# Patient Record
Sex: Female | Born: 1940 | Race: White | Hispanic: No | Smoking: Current every day smoker
Health system: Southern US, Community
[De-identification: ages and names within clinical notes are randomized; demographics above are authoritative.]

## PROBLEM LIST (undated history)

## (undated) DIAGNOSIS — H269 Unspecified cataract: Secondary | ICD-10-CM

## (undated) DIAGNOSIS — R0902 Hypoxemia: Secondary | ICD-10-CM

## (undated) DIAGNOSIS — I10 Essential (primary) hypertension: Secondary | ICD-10-CM

## (undated) DIAGNOSIS — R0600 Dyspnea, unspecified: Secondary | ICD-10-CM

## (undated) DIAGNOSIS — R011 Cardiac murmur, unspecified: Secondary | ICD-10-CM

## (undated) DIAGNOSIS — M419 Scoliosis, unspecified: Secondary | ICD-10-CM

## (undated) DIAGNOSIS — I729 Aneurysm of unspecified site: Secondary | ICD-10-CM

## (undated) DIAGNOSIS — J449 Chronic obstructive pulmonary disease, unspecified: Secondary | ICD-10-CM

## (undated) DIAGNOSIS — I739 Peripheral vascular disease, unspecified: Secondary | ICD-10-CM

## (undated) DIAGNOSIS — A419 Sepsis, unspecified organism: Secondary | ICD-10-CM

## (undated) DIAGNOSIS — K219 Gastro-esophageal reflux disease without esophagitis: Secondary | ICD-10-CM

## (undated) DIAGNOSIS — I38 Endocarditis, valve unspecified: Secondary | ICD-10-CM

## (undated) DIAGNOSIS — R000 Tachycardia, unspecified: Secondary | ICD-10-CM

## (undated) DIAGNOSIS — M4800 Spinal stenosis, site unspecified: Secondary | ICD-10-CM

## (undated) DIAGNOSIS — H5440 Blindness, one eye, unspecified eye: Secondary | ICD-10-CM

## (undated) DIAGNOSIS — I469 Cardiac arrest, cause unspecified: Secondary | ICD-10-CM

## (undated) DIAGNOSIS — T4145XA Adverse effect of unspecified anesthetic, initial encounter: Secondary | ICD-10-CM

## (undated) DIAGNOSIS — F419 Anxiety disorder, unspecified: Secondary | ICD-10-CM

## (undated) DIAGNOSIS — I639 Cerebral infarction, unspecified: Secondary | ICD-10-CM

## (undated) HISTORY — DX: Unspecified cataract: H26.9

## (undated) HISTORY — DX: Cardiac murmur, unspecified: R01.1

## (undated) HISTORY — PX: OTHER SURGICAL HISTORY: SHX169

## (undated) HISTORY — PX: ABDOMINAL HYSTERECTOMY: SHX81

## (undated) HISTORY — DX: Cerebral infarction, unspecified: I63.9

## (undated) HISTORY — DX: Aneurysm of unspecified site: I72.9

## (undated) HISTORY — PX: BREAST LUMPECTOMY: SHX2

## (undated) HISTORY — DX: Blindness, one eye, unspecified eye: H54.40

## (undated) HISTORY — PX: WISDOM TOOTH EXTRACTION: SHX21

## (undated) HISTORY — PX: TOTAL ABDOMINAL HYSTERECTOMY: SHX209

## (undated) HISTORY — PX: BACK SURGERY: SHX140

---
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
## 2008-09-19 ENCOUNTER — Ambulatory Visit: Payer: Self-pay | Admitting: Family Medicine

## 2010-12-06 ENCOUNTER — Ambulatory Visit: Payer: Self-pay | Admitting: Internal Medicine

## 2011-03-06 ENCOUNTER — Ambulatory Visit: Payer: Self-pay | Admitting: Internal Medicine

## 2011-06-17 ENCOUNTER — Ambulatory Visit: Payer: Self-pay

## 2011-07-25 DIAGNOSIS — Z87891 Personal history of nicotine dependence: Secondary | ICD-10-CM | POA: Insufficient documentation

## 2012-02-25 ENCOUNTER — Ambulatory Visit: Payer: Self-pay | Admitting: Physician Assistant

## 2012-02-28 DIAGNOSIS — M419 Scoliosis, unspecified: Secondary | ICD-10-CM | POA: Insufficient documentation

## 2012-02-28 DIAGNOSIS — M48062 Spinal stenosis, lumbar region with neurogenic claudication: Secondary | ICD-10-CM | POA: Insufficient documentation

## 2012-03-15 DIAGNOSIS — T8859XA Other complications of anesthesia, initial encounter: Secondary | ICD-10-CM

## 2012-03-15 HISTORY — DX: Other complications of anesthesia, initial encounter: T88.59XA

## 2012-03-18 DIAGNOSIS — R410 Disorientation, unspecified: Secondary | ICD-10-CM

## 2012-03-18 DIAGNOSIS — R140 Abdominal distension (gaseous): Secondary | ICD-10-CM

## 2012-03-18 HISTORY — DX: Disorientation, unspecified: R41.0

## 2012-03-18 HISTORY — DX: Abdominal distension (gaseous): R14.0

## 2012-03-22 DIAGNOSIS — N179 Acute kidney failure, unspecified: Secondary | ICD-10-CM

## 2012-03-22 DIAGNOSIS — J9601 Acute respiratory failure with hypoxia: Secondary | ICD-10-CM | POA: Insufficient documentation

## 2012-03-22 HISTORY — DX: Acute kidney failure, unspecified: N17.9

## 2012-03-23 DIAGNOSIS — R579 Shock, unspecified: Secondary | ICD-10-CM

## 2012-03-23 DIAGNOSIS — R6510 Systemic inflammatory response syndrome (SIRS) of non-infectious origin without acute organ dysfunction: Secondary | ICD-10-CM

## 2012-03-23 HISTORY — DX: Shock, unspecified: R57.9

## 2012-03-23 HISTORY — DX: Systemic inflammatory response syndrome (sirs) of non-infectious origin without acute organ dysfunction: R65.10

## 2012-09-10 ENCOUNTER — Ambulatory Visit: Payer: Self-pay

## 2012-09-10 ENCOUNTER — Inpatient Hospital Stay: Payer: Self-pay | Admitting: Internal Medicine

## 2012-09-10 LAB — CK TOTAL AND CKMB (NOT AT ARMC): CK-MB: <0.5 ng/mL — ABNORMAL LOW (ref 0.5–3.6)

## 2012-09-10 LAB — URINALYSIS, COMPLETE
Bilirubin,UR: NEGATIVE
Glucose,UR: NEGATIVE mg/dL (ref 0–75)
Hyaline Cast: 21
Leukocyte Esterase: NEGATIVE
Nitrite: NEGATIVE
Ph: 5 (ref 4.5–8.0)
Specific Gravity: 1.023 (ref 1.003–1.030)
WBC UR: 3 /HPF (ref 0–5)

## 2012-09-10 LAB — BASIC METABOLIC PANEL
Anion Gap: 10 (ref 7–16)
BUN: 17 mg/dL (ref 7–18)
Calcium, Total: 9 mg/dL (ref 8.5–10.1)
Chloride: 100 mmol/L (ref 98–107)
Co2: 24 mmol/L (ref 21–32)
EGFR (African American): >60
EGFR (Non-African Amer.): >60
Sodium: 134 mmol/L — ABNORMAL LOW (ref 136–145)

## 2012-09-10 LAB — CBC
HCT: 44.1 % (ref 35.0–47.0)
Platelet: 284 10*3/uL (ref 150–440)
RBC: 4.83 10*6/uL (ref 3.80–5.20)
RDW: 13.7 % (ref 11.5–14.5)

## 2012-09-11 LAB — CBC WITH DIFFERENTIAL/PLATELET
Basophil #: 0 10*3/uL (ref 0.0–0.1)
Basophil %: 0.4 %
Eosinophil %: 0 %
MCH: 29.7 pg (ref 26.0–34.0)
MCV: 91 fL (ref 80–100)
Monocyte %: 1.7 %
Neutrophil #: 9.5 10*3/uL — ABNORMAL HIGH (ref 1.4–6.5)
Neutrophil %: 89.7 %
Platelet: 261 10*3/uL (ref 150–440)
RBC: 4.34 10*6/uL (ref 3.80–5.20)
RDW: 13.5 % (ref 11.5–14.5)

## 2012-09-11 LAB — BASIC METABOLIC PANEL
BUN: 22 mg/dL — ABNORMAL HIGH (ref 7–18)
EGFR (African American): >60
EGFR (Non-African Amer.): >60
Glucose: 262 mg/dL — ABNORMAL HIGH (ref 65–99)
Osmolality: 279 (ref 275–301)
Potassium: 3.8 mmol/L (ref 3.5–5.1)
Sodium: 133 mmol/L — ABNORMAL LOW (ref 136–145)

## 2012-09-11 LAB — TROPONIN I
Troponin-I: <0.02 ng/mL
Troponin-I: <0.02 ng/mL

## 2012-09-11 LAB — LIPID PANEL
Cholesterol: 166 mg/dL (ref 0–200)
Ldl Cholesterol, Calc: 109 mg/dL — ABNORMAL HIGH (ref 0–100)
Triglycerides: 151 mg/dL (ref 0–200)

## 2012-09-12 DIAGNOSIS — I509 Heart failure, unspecified: Secondary | ICD-10-CM

## 2012-09-12 LAB — SODIUM
Sodium: 130 mmol/L — ABNORMAL LOW (ref 136–145)
Sodium: 132 mmol/L — ABNORMAL LOW (ref 136–145)

## 2012-09-12 LAB — URINE CULTURE

## 2012-09-16 LAB — CULTURE, BLOOD (SINGLE)

## 2014-12-05 NOTE — Discharge Summary (Signed)
PATIENT NAME:  Savannah Mcclure, Savannah Mcclure MR#:  [id number] DATE OF BIRTH:  [birth year]  DATE OF ADMISSION:  09/10/2012 DATE OF DISCHARGE:  09/12/2012  ADMITTING DIAGNOSIS:  Chronic obstructive pulmonary disease exacerbation.   DISCHARGE DIAGNOSES:   1.  Severe exacerbation of _____.  2.  Hyponatremia.  3.  Mild dehydration likely. Seemed to be improving on intravenous fluids.  4.  Suspected influenza; however, flu testing is negative.  5.  Tobacco abuse.  6.  History of hypertension.  7.  Anxiety and depression.  8.  Sciatica.  9.  Back pains.  10.  Peripheral vascular disease.  11.  Coronary artery disease status post cardiac arrest in the remote past.   DISCHARGE CONDITION:  Stable.   DISCHARGE MEDICATIONS:  The patient is to resume her outpatient medications which are:  1.  Vitamin D3 at 1000 International Units once daily.  2.  Cilostazol 100 mg p.o. twice daily.  3.  Coenzyme Q10 mg 100 mg p.o. 2 capsules at bedtime.  4.  Vitamin B12 at 1000 mcg p.o. daily.  5.  Diazepam 5 mg 1/2 tablet 2 times daily.  6.  Multivitamin once daily.  7.  Gabapentin 100 mg p.o. twice daily.  8.  Albuterol ipratropium aerosol 1 puff twice daily as needed.  9.  Artificial tear ophthalmic solution 1 drop to each affected eye twice daily as needed.  10.  Asmanex Twisthaler 1 puff twice daily.  11.  Nitrostat 0.4 mg sublingually every 5 minutes as needed.  12.  Oxycodone 5 mg every 6 hours as needed.  13.  Sertraline 50 mg p.o. once daily.  14.  Symbicort 160/4.5 mcg 1 puff twice daily.  15.  Amlodipine/benazepril 5/10 mg 2 capsules once Mcclure day (this is Mcclure new medication dose. In the past, she was only on 1 capsule once daily).  16.  Combivent Respimat CFC-3 100/20 mcg 1 puff 4 times daily.  17.  Oseltamivir which is Tamiflu 75 mg p.o. 1 capsule twice daily for Mcclure total of 5 days.  18.  Guaifenesin  600 mg p.o. twice daily for 14 days.  19.  Azithromycin 250 mg p.o. once daily for 4 days.  20.  _____  inhaler 1 every 1 hour as needed.  21.  Tussionex 5 mL twice daily for 14 days as needed.  22.  Prednisone taper 50 mg p.o. once on the September 13, 2012  then taper by 10 mg daily then stop.   HOME OXYGEN:  None.  DIET:  2 grams salt, low fat, low cholesterol, diet consistency regular.   ACTIVITY LIMITATIONS:  As tolerated.   FOLLOWUP APPOINTMENT:  With Dr. Shea Evans in [place] in 2 days after discharge.   CONSULTANTS:  Care management.   RADIOLOGIC STUDIES:  Chest x-ray PA and lateral on September 10, 2012, revealed findings concerning for superimposed upper lobe pneumonia and reticular nodular densities in the right midlung noted, possibility of cephalization of _____ is not excluded _____ lungs otherwise show no definite edema _____ clinical and laboratory data. _____ left upper lobe pneumonia superimposed COPD and fibrosis according to the radiologist   HISTORY OF PRESENT ILLNESS:  The patient is Mcclure 74 year old Caucasian female with history of COPD with ongoing tobacco abuse who presented to the hospital with complaints of cough, fevers to 101 as well as vomiting because of paroxysmal cough. Apparently, she was delirious and coughing up pale green phlegm. She was short of breath. She presented to the hospital. She was noted  to have Mcclure mildly elevated white blood cell count and hospitalist services were contacted for admission. On arrival to the hospital, the patient's lungs revealed decreased breath sounds bilaterally, positive rhonchi throughout the entire lung fields, but no use of accessory muscles to breathe. Otherwise, physical examination was unremarkable. The patient's lab data on the day of admission on September 10, 2012 showed elevated glucose to 106, sodium 134, otherwise unremarkable BMP. The patient's cardiac enzymes x 4 showed no significant abnormalities. The patient's white blood cell count was slightly elevated to 13.7, hemoglobin was 14.1, platelet count 284. Blood cultures  x 2 taken on September 10, 2012 showed no growth. Influenza testing was negative for influenza antigen Mcclure as well as influenza antigen B. Urine culture clean catch revealed mixed bacterial organisms, results suggestive of contamination. The urinalysis itself revealed amber cloudy urine, negative for glucose or bilirubin, trace ketones, specific gravity 1.025, pH 5.0, negative for blood, 100 mg/dL protein, negative for nitrites or leukocyte esterase, 1 red blood cell, 3 white blood cells, 3+ bacteria, 15 epithelial cells as well as mucus was present as well as hyaline casts. The patient's EKG showed sinus tachycardia at 123 beats per minute and possible left atrial enlargement, low voltage QRS, right bundle branch block, septal infarct age undetermined.   The patient was admitted to the hospital with Mcclure diagnosis of suspected influenza as well as bacterial pneumonia. She was started on Rocephin and Zithromax as well as steroids, inhalation therapy and nebulizers. With this therapy, she significantly improved. By the day of discharge, September 12, 2012, her oxygenation was better and she was actually at 97% on room air, at rest as well as on exertion. She did not exhibit any signs of wheezing and she overall felt somewhat more comfortable. She was still having some cough, but no significant sputum production. Sputum cultures were not obtained unfortunately; however, as the patient was improving on current therapy, it was felt that the patient is to continue current therapy and follow up with her primary care physician in the next few days after discharge to make decisions about other interventions if needed. We did not repeat her chest x-ray as we felt that she was improving overall. On the day of discharge, her temperature is 97.1, pulse was ranging from 89 to 111, her respiratory rate was 16 to 18, blood pressure was from 43P to 295 systolic and 18A to 41Y diastolic and oxygen saturation was 94% to 97% on room air at  rest. She was ambulating in the hallway and she felt quite comfortable to be discharged home. She is to continue steroid taper as well as antibiotic therapy as well as influenza therapy with Tamiflu. She was counseled about tobacco abuse and was recommended nicotine replacement therapy to which she was agreeable. In regards to hypertension, her blood pressure medications were advanced. It is recommended to follow the patient's blood pressure readings and make decisions about changing her blood pressure medications if in fact her elevated blood pressure in the hospital was just stress related. She was noted to have mild hyponatremia on arrival to the hospital.  As Mcclure trial she was given Mcclure dose of Lasix which resulted in an even lower sodium level of 130 on September 12, 2012; however, the patient was given back IV fluids and her sodium level improved. It was felt that the patient's hyponatremia probably was volume depletion. She was recommended to drink plenty of fluids and have her sodium level repeated as an outpatient. In  regards to her other chronic medical problems such back pain, peripheral vascular disease, coronary artery disease, the patient is to continue her outpatient medications. No changes were made. The patient is being discharged in stable condition with the above-mentioned medications and followup.   TIME SPENT:  40 minutes.    ____________________________ Theodoro Grist, MD rv:si D: 09/12/2012 19:29:00 ET T: 09/12/2012 20:29:02 ET JOB#: [id number]  cc: Theodoro Grist, MD, <Dictator> Azucena Freed, MD, Renae Fickle MD ELECTRONICALLY SIGNED 10/04/2012 18:28

## 2014-12-05 NOTE — H&P (Signed)
PATIENT NAME:  Savannah Mcclure, Savannah Mcclure MR#:  [id number] DATE OF BIRTH:  1941/02/05  DATE OF ADMISSION:  09/10/2012  PRIMARY CARE PHYSICIAN:  Dr. Shea Evans in [place].   CHIEF COMPLAINT: "I think I have the flu."   HISTORY OF PRESENT ILLNESS: This is a 74 year old female who has 4 days of coughing. She is coughing so much it is actually causing vomiting, fever of 101. Normally her temperature is about 97. She states that she has been talking to herself in the middle of night. She has been delirious with the fever breaking. She is coughing up pale green phlegm. She is short of breath. In the ER, she had a negative influenza A and B swab. She was found to have an elevated white blood cell count at 13.7 and hospitalist services were contacted for COPD exacerbation.   PAST MEDICAL HISTORY: COPD, scoliosis, sciatica, peripheral vascular disease, hypertension, depression, anxiety, history of angina, and prior cardiac arrest.   PAST SURGICAL HISTORY:  Back surgery, lumpectomy, hysterectomy, tonsillectomy wisdom teeth, a feeding tube placement and removal.   ALLERGIES: RELAFEN, SULFA, AND POSSIBLY CIPRO. MYCIN CLASS CAUSES GI UPSET.   MEDICATIONS: As per prescription writer and include: Albuterol ipratropium inhalation one puff twice a day as needed for shortness of breath, Lotrel 10/20 one capsule daily, artificial tears twice a day p.r.n. for dry eyes, Asmanex 220 mcg one puff twice a day, Pletal 100 mg twice a day, Coenzyme Q10 at 100 mg two capsules at bedtime, diazepam 5 mg half tablet twice a day, gabapentin 100 mg one capsule twice a day, multivitamin one tablet daily,  Nitrostat 0.4 mg every five minutes as needed for chest pain, oxycodone 5 mg every six hours as needed for pain, Zoloft 50 mg daily, Symbicort 164.5, vitamin B12 at 1,000 mcg one tablet daily, vitamin D3 1,000 units daily.   SOCIAL HISTORY: Quit smoking over the summer. No alcohol. No drug use. Used to work in the health  department.   FAMILY HISTORY: Father died at 3 of congestive heart failure. Mother died at 13 of cerebral hemorrhage.   REVIEW OF SYSTEMS:  CONSTITUTIONAL: Positive for fever. Positive for chills. Positive for sweats. Positive for weight loss. Positive for fatigue.  EYES: She does wear contacts.  EARS, NOSE, MOUTH, AND THROAT: Right ear decreased hearing. Positive for runny nose. No sore throat. No difficulty swallowing.  CARDIOVASCULAR: No chest pain. No palpitations.  RESPIRATORY: Positive for shortness of breath. Positive for coughing up pale green phlegm. No hemoptysis.  GASTROINTESTINAL: Positive for nausea. Positive for vomiting. No hematemesis. Positive upper abdominal pain at the site of the prior PEG site. No diarrhea. No constipation. No bright red blood per rectum. No melena.  GENITOURINARY: Negative for burning on urination and no hematuria.  MUSCULOSKELETAL: Positive for joint pain.  INTEGUMENT: No rashes or eruptions.  NEUROLOGIC: Felt faint the other day.  PSYCHIATRIC: Positive for anxiety and depression.  ENDOCRINE: No thyroid problems.  HEMATOLOGIC/LYMPHATIC: No anemia. No easy bruising or bleeding.   PHYSICAL EXAMINATION: Temperature 97.9, pulse 90, respirations 22, blood pressure 145/82, pulse oximetry 94% on room air.  GENERAL: No respiratory distress, coughing with deep breath.  EYES: Conjunctivae and lids no lesions. Pupils equal, round, and reactive to light. Extraocular muscles intact. No nystagmus. EARS, NOSE, MOUTH, AND THROAT: Tympanic membrane on the right partially obscured by wax, but no erythema seen. Tympanic membrane on the left negative, no erythema.  Lips and gums no lesions.  NECK: No JVD. No bruits. No  lymphadenopathy. No thyromegaly. No thyroid nodules palpated.  LUNGS: Decreased breath sounds bilaterally. Positive rhonchi throughout the entire lung field. No use of accessory muscles to breathe.  CARDIOVASCULAR: S1 and S2 tachycardic. No gallops, rubs,  or murmurs heard. Carotid upstroke 2+ bilaterally. No bruits. Dorsalis pedis pulses 2+ bilaterally. No edema of the lower extremity.  ABDOMEN: Soft, nontender. No organomegaly/splenomegaly. Normoactive bowel sounds. No masses felt.  LYMPHATIC: No lymph nodes in the neck.  MUSCULOSKELETAL: No clubbing, edema, or cyanosis.   SKIN: No ulcers or lesions seen.  NEUROLOGIC: Cranial nerves II through XII grossly intact. Deep tendon reflexes 2+ bilateral lower extremities.  PSYCHIATRIC: The patient is oriented to person, place, and time.   LABORATORY AND RADIOLOGICAL DATA: Troponin negative. BNP 326. Influenza negative. Troponin was negative x 2. White blood cell count 13.7, hemoglobin and hematocrit 14.1 and 44.1, platelet count 284,000, glucose 106, BUN 17, creatinine 0.91, sodium 134, potassium 3.5, chloride 100, CO2 of 24, and calcium 9.   EKG shows sinus tachycardia, 123 beats per minute, left atrial enlargement, low voltage, right bundle branch block, Q waves septally.   ASSESSMENT AND PLAN: 1.  Chronic obstructive pulmonary disease exacerbation. We will give empiric Tamiflu for possible influenza, even though flu swab negative. We will give Rocephin and Zithromax, Solu-Medrol 60 mg IV q. 8 hours. Continue nebulizer treatment and Symbicort.  2.  Peripheral vascular disease on Pletal.  3.  Hypertension, blood pressure currently stable.  4.  Depression and anxiety on Valium and Zoloft.  5.  Chronic pain on oxycodone.  6.  Abnormal EKG. We will monitor overnight on telemetry and get serial cardiac enzymes.  TIME SPENT ON ADMISSION: 55 minutes.   CODE STATUS: The patient is a full code.    ____________________________ Tana Conch. Leslye Peer, MD rjw:cc D: 09/10/2012 19:45:51 ET T: 09/10/2012 22:08:31 ET JOB#: [id number]  cc: Tana Conch. Leslye Peer, MD, <Dictator> Dr. Shea Evans Marisue Brooklyn MD ELECTRONICALLY SIGNED 09/14/2012 15:58

## 2015-08-18 DIAGNOSIS — R52 Pain, unspecified: Secondary | ICD-10-CM | POA: Diagnosis not present

## 2015-08-21 DIAGNOSIS — R52 Pain, unspecified: Secondary | ICD-10-CM | POA: Diagnosis not present

## 2015-08-24 DIAGNOSIS — R52 Pain, unspecified: Secondary | ICD-10-CM | POA: Diagnosis not present

## 2015-08-28 DIAGNOSIS — R52 Pain, unspecified: Secondary | ICD-10-CM | POA: Diagnosis not present

## 2015-08-31 DIAGNOSIS — R52 Pain, unspecified: Secondary | ICD-10-CM | POA: Diagnosis not present

## 2015-09-04 DIAGNOSIS — R52 Pain, unspecified: Secondary | ICD-10-CM | POA: Diagnosis not present

## 2015-09-07 DIAGNOSIS — R52 Pain, unspecified: Secondary | ICD-10-CM | POA: Diagnosis not present

## 2015-09-11 DIAGNOSIS — R52 Pain, unspecified: Secondary | ICD-10-CM | POA: Diagnosis not present

## 2015-09-15 DIAGNOSIS — R52 Pain, unspecified: Secondary | ICD-10-CM | POA: Diagnosis not present

## 2015-09-18 DIAGNOSIS — R52 Pain, unspecified: Secondary | ICD-10-CM | POA: Diagnosis not present

## 2015-09-22 DIAGNOSIS — R52 Pain, unspecified: Secondary | ICD-10-CM | POA: Diagnosis not present

## 2015-09-25 DIAGNOSIS — R52 Pain, unspecified: Secondary | ICD-10-CM | POA: Diagnosis not present

## 2015-09-29 DIAGNOSIS — R52 Pain, unspecified: Secondary | ICD-10-CM | POA: Diagnosis not present

## 2015-10-02 DIAGNOSIS — R52 Pain, unspecified: Secondary | ICD-10-CM | POA: Diagnosis not present

## 2015-10-06 DIAGNOSIS — R52 Pain, unspecified: Secondary | ICD-10-CM | POA: Diagnosis not present

## 2015-10-16 DIAGNOSIS — R52 Pain, unspecified: Secondary | ICD-10-CM | POA: Diagnosis not present

## 2015-10-20 DIAGNOSIS — R52 Pain, unspecified: Secondary | ICD-10-CM | POA: Diagnosis not present

## 2015-10-21 DIAGNOSIS — R52 Pain, unspecified: Secondary | ICD-10-CM | POA: Diagnosis not present

## 2015-10-27 DIAGNOSIS — R52 Pain, unspecified: Secondary | ICD-10-CM | POA: Diagnosis not present

## 2015-10-30 DIAGNOSIS — R52 Pain, unspecified: Secondary | ICD-10-CM | POA: Diagnosis not present

## 2015-11-06 DIAGNOSIS — R52 Pain, unspecified: Secondary | ICD-10-CM | POA: Diagnosis not present

## 2015-11-10 DIAGNOSIS — R52 Pain, unspecified: Secondary | ICD-10-CM | POA: Diagnosis not present

## 2015-11-20 DIAGNOSIS — D225 Melanocytic nevi of trunk: Secondary | ICD-10-CM | POA: Diagnosis not present

## 2015-11-20 DIAGNOSIS — D485 Neoplasm of uncertain behavior of skin: Secondary | ICD-10-CM | POA: Diagnosis not present

## 2015-11-20 DIAGNOSIS — D2239 Melanocytic nevi of other parts of face: Secondary | ICD-10-CM | POA: Diagnosis not present

## 2015-11-20 DIAGNOSIS — L578 Other skin changes due to chronic exposure to nonionizing radiation: Secondary | ICD-10-CM | POA: Diagnosis not present

## 2015-11-20 DIAGNOSIS — L821 Other seborrheic keratosis: Secondary | ICD-10-CM | POA: Diagnosis not present

## 2015-11-20 DIAGNOSIS — D2262 Melanocytic nevi of left upper limb, including shoulder: Secondary | ICD-10-CM | POA: Diagnosis not present

## 2015-11-20 DIAGNOSIS — D1801 Hemangioma of skin and subcutaneous tissue: Secondary | ICD-10-CM | POA: Diagnosis not present

## 2015-11-20 DIAGNOSIS — L820 Inflamed seborrheic keratosis: Secondary | ICD-10-CM | POA: Diagnosis not present

## 2015-11-20 DIAGNOSIS — D2272 Melanocytic nevi of left lower limb, including hip: Secondary | ICD-10-CM | POA: Diagnosis not present

## 2015-11-20 DIAGNOSIS — D2261 Melanocytic nevi of right upper limb, including shoulder: Secondary | ICD-10-CM | POA: Diagnosis not present

## 2015-11-20 DIAGNOSIS — L570 Actinic keratosis: Secondary | ICD-10-CM | POA: Diagnosis not present

## 2015-11-20 DIAGNOSIS — D2271 Melanocytic nevi of right lower limb, including hip: Secondary | ICD-10-CM | POA: Diagnosis not present

## 2015-12-02 DIAGNOSIS — R52 Pain, unspecified: Secondary | ICD-10-CM | POA: Diagnosis not present

## 2015-12-08 DIAGNOSIS — R52 Pain, unspecified: Secondary | ICD-10-CM | POA: Diagnosis not present

## 2015-12-11 DIAGNOSIS — R52 Pain, unspecified: Secondary | ICD-10-CM | POA: Diagnosis not present

## 2015-12-16 DIAGNOSIS — R52 Pain, unspecified: Secondary | ICD-10-CM | POA: Diagnosis not present

## 2015-12-18 DIAGNOSIS — R52 Pain, unspecified: Secondary | ICD-10-CM | POA: Diagnosis not present

## 2016-01-05 DIAGNOSIS — R52 Pain, unspecified: Secondary | ICD-10-CM | POA: Diagnosis not present

## 2016-01-08 DIAGNOSIS — R52 Pain, unspecified: Secondary | ICD-10-CM | POA: Diagnosis not present

## 2016-01-13 DIAGNOSIS — R52 Pain, unspecified: Secondary | ICD-10-CM | POA: Diagnosis not present

## 2016-01-20 DIAGNOSIS — H16041 Marginal corneal ulcer, right eye: Secondary | ICD-10-CM | POA: Diagnosis not present

## 2016-01-25 DIAGNOSIS — H16041 Marginal corneal ulcer, right eye: Secondary | ICD-10-CM | POA: Diagnosis not present

## 2016-03-11 DIAGNOSIS — I10 Essential (primary) hypertension: Secondary | ICD-10-CM | POA: Diagnosis not present

## 2016-03-11 DIAGNOSIS — M1990 Unspecified osteoarthritis, unspecified site: Secondary | ICD-10-CM | POA: Diagnosis not present

## 2016-03-11 DIAGNOSIS — L989 Disorder of the skin and subcutaneous tissue, unspecified: Secondary | ICD-10-CM | POA: Diagnosis not present

## 2016-03-11 DIAGNOSIS — Z205 Contact with and (suspected) exposure to viral hepatitis: Secondary | ICD-10-CM | POA: Diagnosis not present

## 2016-03-11 DIAGNOSIS — M4806 Spinal stenosis, lumbar region: Secondary | ICD-10-CM | POA: Diagnosis not present

## 2016-03-11 DIAGNOSIS — R000 Tachycardia, unspecified: Secondary | ICD-10-CM | POA: Diagnosis not present

## 2016-03-15 DIAGNOSIS — G8929 Other chronic pain: Secondary | ICD-10-CM | POA: Diagnosis not present

## 2016-03-15 DIAGNOSIS — I10 Essential (primary) hypertension: Secondary | ICD-10-CM | POA: Diagnosis not present

## 2016-03-15 DIAGNOSIS — F17210 Nicotine dependence, cigarettes, uncomplicated: Secondary | ICD-10-CM | POA: Diagnosis not present

## 2016-03-15 DIAGNOSIS — Z1231 Encounter for screening mammogram for malignant neoplasm of breast: Secondary | ICD-10-CM | POA: Diagnosis not present

## 2016-03-15 DIAGNOSIS — M5441 Lumbago with sciatica, right side: Secondary | ICD-10-CM | POA: Diagnosis not present

## 2016-03-15 DIAGNOSIS — R000 Tachycardia, unspecified: Secondary | ICD-10-CM | POA: Diagnosis not present

## 2016-03-15 DIAGNOSIS — M5442 Lumbago with sciatica, left side: Secondary | ICD-10-CM | POA: Diagnosis not present

## 2016-03-22 DIAGNOSIS — L821 Other seborrheic keratosis: Secondary | ICD-10-CM | POA: Diagnosis not present

## 2016-03-22 DIAGNOSIS — L820 Inflamed seborrheic keratosis: Secondary | ICD-10-CM | POA: Diagnosis not present

## 2016-03-22 DIAGNOSIS — L298 Other pruritus: Secondary | ICD-10-CM | POA: Diagnosis not present

## 2016-05-23 DIAGNOSIS — Z23 Encounter for immunization: Secondary | ICD-10-CM | POA: Diagnosis not present

## 2016-08-31 DIAGNOSIS — M5441 Lumbago with sciatica, right side: Secondary | ICD-10-CM | POA: Diagnosis not present

## 2016-08-31 DIAGNOSIS — Z23 Encounter for immunization: Secondary | ICD-10-CM | POA: Diagnosis not present

## 2016-08-31 DIAGNOSIS — R000 Tachycardia, unspecified: Secondary | ICD-10-CM | POA: Diagnosis not present

## 2016-08-31 DIAGNOSIS — I739 Peripheral vascular disease, unspecified: Secondary | ICD-10-CM | POA: Diagnosis not present

## 2016-08-31 DIAGNOSIS — F4320 Adjustment disorder, unspecified: Secondary | ICD-10-CM | POA: Diagnosis not present

## 2016-08-31 DIAGNOSIS — Z87891 Personal history of nicotine dependence: Secondary | ICD-10-CM | POA: Diagnosis not present

## 2016-08-31 DIAGNOSIS — Z780 Asymptomatic menopausal state: Secondary | ICD-10-CM | POA: Diagnosis not present

## 2016-08-31 DIAGNOSIS — Z8679 Personal history of other diseases of the circulatory system: Secondary | ICD-10-CM | POA: Diagnosis not present

## 2016-08-31 DIAGNOSIS — M5442 Lumbago with sciatica, left side: Secondary | ICD-10-CM | POA: Diagnosis not present

## 2016-08-31 DIAGNOSIS — F5102 Adjustment insomnia: Secondary | ICD-10-CM | POA: Diagnosis not present

## 2016-08-31 DIAGNOSIS — I341 Nonrheumatic mitral (valve) prolapse: Secondary | ICD-10-CM | POA: Diagnosis not present

## 2016-08-31 DIAGNOSIS — G8929 Other chronic pain: Secondary | ICD-10-CM | POA: Diagnosis not present

## 2016-08-31 DIAGNOSIS — Z981 Arthrodesis status: Secondary | ICD-10-CM | POA: Diagnosis not present

## 2016-08-31 DIAGNOSIS — I10 Essential (primary) hypertension: Secondary | ICD-10-CM | POA: Diagnosis not present

## 2016-08-31 DIAGNOSIS — Z0000 Encounter for general adult medical examination without abnormal findings: Secondary | ICD-10-CM | POA: Diagnosis not present

## 2016-08-31 DIAGNOSIS — M48061 Spinal stenosis, lumbar region without neurogenic claudication: Secondary | ICD-10-CM | POA: Diagnosis not present

## 2016-08-31 DIAGNOSIS — Z789 Other specified health status: Secondary | ICD-10-CM | POA: Diagnosis not present

## 2016-09-08 DIAGNOSIS — M48061 Spinal stenosis, lumbar region without neurogenic claudication: Secondary | ICD-10-CM | POA: Diagnosis not present

## 2016-09-08 DIAGNOSIS — M5442 Lumbago with sciatica, left side: Secondary | ICD-10-CM | POA: Diagnosis not present

## 2016-09-08 DIAGNOSIS — M5441 Lumbago with sciatica, right side: Secondary | ICD-10-CM | POA: Diagnosis not present

## 2016-09-08 DIAGNOSIS — G8929 Other chronic pain: Secondary | ICD-10-CM | POA: Diagnosis not present

## 2016-09-08 DIAGNOSIS — M5136 Other intervertebral disc degeneration, lumbar region: Secondary | ICD-10-CM | POA: Diagnosis not present

## 2016-09-26 DIAGNOSIS — I10 Essential (primary) hypertension: Secondary | ICD-10-CM | POA: Diagnosis not present

## 2016-09-26 DIAGNOSIS — I341 Nonrheumatic mitral (valve) prolapse: Secondary | ICD-10-CM | POA: Diagnosis not present

## 2016-09-27 DIAGNOSIS — R52 Pain, unspecified: Secondary | ICD-10-CM | POA: Diagnosis not present

## 2016-09-30 DIAGNOSIS — R0989 Other specified symptoms and signs involving the circulatory and respiratory systems: Secondary | ICD-10-CM | POA: Diagnosis not present

## 2016-09-30 DIAGNOSIS — I6523 Occlusion and stenosis of bilateral carotid arteries: Secondary | ICD-10-CM | POA: Diagnosis not present

## 2016-10-03 DIAGNOSIS — J301 Allergic rhinitis due to pollen: Secondary | ICD-10-CM | POA: Diagnosis not present

## 2016-10-03 DIAGNOSIS — R000 Tachycardia, unspecified: Secondary | ICD-10-CM | POA: Diagnosis not present

## 2016-10-03 DIAGNOSIS — R0989 Other specified symptoms and signs involving the circulatory and respiratory systems: Secondary | ICD-10-CM | POA: Diagnosis not present

## 2016-10-03 DIAGNOSIS — F4320 Adjustment disorder, unspecified: Secondary | ICD-10-CM | POA: Diagnosis not present

## 2016-10-03 DIAGNOSIS — I10 Essential (primary) hypertension: Secondary | ICD-10-CM | POA: Diagnosis not present

## 2016-10-03 DIAGNOSIS — I739 Peripheral vascular disease, unspecified: Secondary | ICD-10-CM | POA: Diagnosis not present

## 2016-10-06 DIAGNOSIS — R0989 Other specified symptoms and signs involving the circulatory and respiratory systems: Secondary | ICD-10-CM | POA: Diagnosis not present

## 2016-10-06 DIAGNOSIS — I70213 Atherosclerosis of native arteries of extremities with intermittent claudication, bilateral legs: Secondary | ICD-10-CM | POA: Diagnosis not present

## 2016-10-06 DIAGNOSIS — I6523 Occlusion and stenosis of bilateral carotid arteries: Secondary | ICD-10-CM | POA: Diagnosis not present

## 2016-10-06 DIAGNOSIS — I739 Peripheral vascular disease, unspecified: Secondary | ICD-10-CM | POA: Diagnosis not present

## 2016-10-06 DIAGNOSIS — I70219 Atherosclerosis of native arteries of extremities with intermittent claudication, unspecified extremity: Secondary | ICD-10-CM | POA: Diagnosis not present

## 2016-10-07 DIAGNOSIS — M5442 Lumbago with sciatica, left side: Secondary | ICD-10-CM | POA: Diagnosis not present

## 2016-10-07 DIAGNOSIS — Z9889 Other specified postprocedural states: Secondary | ICD-10-CM | POA: Diagnosis not present

## 2016-10-07 DIAGNOSIS — M5441 Lumbago with sciatica, right side: Secondary | ICD-10-CM | POA: Diagnosis not present

## 2016-10-07 DIAGNOSIS — G8929 Other chronic pain: Secondary | ICD-10-CM | POA: Diagnosis not present

## 2016-10-07 DIAGNOSIS — M47816 Spondylosis without myelopathy or radiculopathy, lumbar region: Secondary | ICD-10-CM | POA: Diagnosis not present

## 2016-10-07 DIAGNOSIS — M41115 Juvenile idiopathic scoliosis, thoracolumbar region: Secondary | ICD-10-CM | POA: Diagnosis not present

## 2016-10-07 DIAGNOSIS — M1288 Other specific arthropathies, not elsewhere classified, other specified site: Secondary | ICD-10-CM | POA: Diagnosis not present

## 2016-10-10 DIAGNOSIS — Z780 Asymptomatic menopausal state: Secondary | ICD-10-CM | POA: Diagnosis not present

## 2016-10-12 DIAGNOSIS — I6523 Occlusion and stenosis of bilateral carotid arteries: Secondary | ICD-10-CM | POA: Diagnosis not present

## 2016-10-12 DIAGNOSIS — Z0000 Encounter for general adult medical examination without abnormal findings: Secondary | ICD-10-CM | POA: Diagnosis not present

## 2016-10-12 DIAGNOSIS — R0989 Other specified symptoms and signs involving the circulatory and respiratory systems: Secondary | ICD-10-CM | POA: Diagnosis not present

## 2016-10-14 DIAGNOSIS — R21 Rash and other nonspecific skin eruption: Secondary | ICD-10-CM | POA: Diagnosis not present

## 2016-10-14 DIAGNOSIS — R0989 Other specified symptoms and signs involving the circulatory and respiratory systems: Secondary | ICD-10-CM | POA: Diagnosis not present

## 2016-10-14 DIAGNOSIS — I4510 Unspecified right bundle-branch block: Secondary | ICD-10-CM | POA: Diagnosis not present

## 2016-10-14 DIAGNOSIS — I10 Essential (primary) hypertension: Secondary | ICD-10-CM | POA: Diagnosis not present

## 2016-10-24 DIAGNOSIS — M419 Scoliosis, unspecified: Secondary | ICD-10-CM | POA: Diagnosis not present

## 2016-10-24 DIAGNOSIS — Z9889 Other specified postprocedural states: Secondary | ICD-10-CM | POA: Diagnosis not present

## 2016-10-24 DIAGNOSIS — I739 Peripheral vascular disease, unspecified: Secondary | ICD-10-CM | POA: Diagnosis not present

## 2016-10-24 DIAGNOSIS — M47816 Spondylosis without myelopathy or radiculopathy, lumbar region: Secondary | ICD-10-CM | POA: Diagnosis not present

## 2016-10-24 DIAGNOSIS — M810 Age-related osteoporosis without current pathological fracture: Secondary | ICD-10-CM | POA: Diagnosis not present

## 2016-10-24 DIAGNOSIS — M1288 Other specific arthropathies, not elsewhere classified, other specified site: Secondary | ICD-10-CM | POA: Diagnosis not present

## 2016-10-24 DIAGNOSIS — J449 Chronic obstructive pulmonary disease, unspecified: Secondary | ICD-10-CM | POA: Diagnosis not present

## 2016-10-24 DIAGNOSIS — I10 Essential (primary) hypertension: Secondary | ICD-10-CM | POA: Diagnosis not present

## 2016-10-24 DIAGNOSIS — M47894 Other spondylosis, thoracic region: Secondary | ICD-10-CM | POA: Diagnosis not present

## 2016-10-24 DIAGNOSIS — M41115 Juvenile idiopathic scoliosis, thoracolumbar region: Secondary | ICD-10-CM | POA: Diagnosis not present

## 2016-10-24 DIAGNOSIS — F17290 Nicotine dependence, other tobacco product, uncomplicated: Secondary | ICD-10-CM | POA: Diagnosis not present

## 2016-10-26 DIAGNOSIS — R0602 Shortness of breath: Secondary | ICD-10-CM | POA: Diagnosis not present

## 2016-11-01 DIAGNOSIS — M5442 Lumbago with sciatica, left side: Secondary | ICD-10-CM | POA: Diagnosis not present

## 2016-11-01 DIAGNOSIS — M48061 Spinal stenosis, lumbar region without neurogenic claudication: Secondary | ICD-10-CM | POA: Diagnosis not present

## 2016-11-01 DIAGNOSIS — I739 Peripheral vascular disease, unspecified: Secondary | ICD-10-CM | POA: Diagnosis not present

## 2016-11-01 DIAGNOSIS — M5441 Lumbago with sciatica, right side: Secondary | ICD-10-CM | POA: Diagnosis not present

## 2016-11-14 DIAGNOSIS — M41115 Juvenile idiopathic scoliosis, thoracolumbar region: Secondary | ICD-10-CM | POA: Diagnosis not present

## 2016-11-14 DIAGNOSIS — M1288 Other specific arthropathies, not elsewhere classified, other specified site: Secondary | ICD-10-CM | POA: Diagnosis not present

## 2016-11-14 DIAGNOSIS — M47816 Spondylosis without myelopathy or radiculopathy, lumbar region: Secondary | ICD-10-CM | POA: Diagnosis not present

## 2016-11-14 DIAGNOSIS — Z9889 Other specified postprocedural states: Secondary | ICD-10-CM | POA: Diagnosis not present

## 2016-12-01 DIAGNOSIS — M9902 Segmental and somatic dysfunction of thoracic region: Secondary | ICD-10-CM | POA: Diagnosis not present

## 2016-12-01 DIAGNOSIS — M9903 Segmental and somatic dysfunction of lumbar region: Secondary | ICD-10-CM | POA: Diagnosis not present

## 2016-12-01 DIAGNOSIS — M9901 Segmental and somatic dysfunction of cervical region: Secondary | ICD-10-CM | POA: Diagnosis not present

## 2016-12-01 DIAGNOSIS — M62830 Muscle spasm of back: Secondary | ICD-10-CM | POA: Diagnosis not present

## 2016-12-07 DIAGNOSIS — M62830 Muscle spasm of back: Secondary | ICD-10-CM | POA: Diagnosis not present

## 2016-12-07 DIAGNOSIS — M9903 Segmental and somatic dysfunction of lumbar region: Secondary | ICD-10-CM | POA: Diagnosis not present

## 2016-12-07 DIAGNOSIS — M9902 Segmental and somatic dysfunction of thoracic region: Secondary | ICD-10-CM | POA: Diagnosis not present

## 2016-12-07 DIAGNOSIS — M9901 Segmental and somatic dysfunction of cervical region: Secondary | ICD-10-CM | POA: Diagnosis not present

## 2016-12-12 DIAGNOSIS — M9901 Segmental and somatic dysfunction of cervical region: Secondary | ICD-10-CM | POA: Diagnosis not present

## 2016-12-12 DIAGNOSIS — M9902 Segmental and somatic dysfunction of thoracic region: Secondary | ICD-10-CM | POA: Diagnosis not present

## 2016-12-12 DIAGNOSIS — M9903 Segmental and somatic dysfunction of lumbar region: Secondary | ICD-10-CM | POA: Diagnosis not present

## 2016-12-12 DIAGNOSIS — M62830 Muscle spasm of back: Secondary | ICD-10-CM | POA: Diagnosis not present

## 2016-12-15 DIAGNOSIS — M9901 Segmental and somatic dysfunction of cervical region: Secondary | ICD-10-CM | POA: Diagnosis not present

## 2016-12-15 DIAGNOSIS — M9903 Segmental and somatic dysfunction of lumbar region: Secondary | ICD-10-CM | POA: Diagnosis not present

## 2016-12-15 DIAGNOSIS — M9902 Segmental and somatic dysfunction of thoracic region: Secondary | ICD-10-CM | POA: Diagnosis not present

## 2016-12-15 DIAGNOSIS — M62830 Muscle spasm of back: Secondary | ICD-10-CM | POA: Diagnosis not present

## 2016-12-19 DIAGNOSIS — H4311 Vitreous hemorrhage, right eye: Secondary | ICD-10-CM | POA: Diagnosis not present

## 2016-12-19 DIAGNOSIS — H348111 Central retinal vein occlusion, right eye, with retinal neovascularization: Secondary | ICD-10-CM | POA: Diagnosis not present

## 2016-12-19 DIAGNOSIS — H43812 Vitreous degeneration, left eye: Secondary | ICD-10-CM | POA: Diagnosis not present

## 2016-12-20 DIAGNOSIS — M9901 Segmental and somatic dysfunction of cervical region: Secondary | ICD-10-CM | POA: Diagnosis not present

## 2016-12-20 DIAGNOSIS — M9902 Segmental and somatic dysfunction of thoracic region: Secondary | ICD-10-CM | POA: Diagnosis not present

## 2016-12-20 DIAGNOSIS — M62830 Muscle spasm of back: Secondary | ICD-10-CM | POA: Diagnosis not present

## 2016-12-20 DIAGNOSIS — M9903 Segmental and somatic dysfunction of lumbar region: Secondary | ICD-10-CM | POA: Diagnosis not present

## 2016-12-23 DIAGNOSIS — M9903 Segmental and somatic dysfunction of lumbar region: Secondary | ICD-10-CM | POA: Diagnosis not present

## 2016-12-23 DIAGNOSIS — M62830 Muscle spasm of back: Secondary | ICD-10-CM | POA: Diagnosis not present

## 2016-12-23 DIAGNOSIS — M9901 Segmental and somatic dysfunction of cervical region: Secondary | ICD-10-CM | POA: Diagnosis not present

## 2016-12-23 DIAGNOSIS — M9902 Segmental and somatic dysfunction of thoracic region: Secondary | ICD-10-CM | POA: Diagnosis not present

## 2016-12-26 DIAGNOSIS — H348111 Central retinal vein occlusion, right eye, with retinal neovascularization: Secondary | ICD-10-CM | POA: Diagnosis not present

## 2016-12-27 DIAGNOSIS — M62830 Muscle spasm of back: Secondary | ICD-10-CM | POA: Diagnosis not present

## 2016-12-27 DIAGNOSIS — M9902 Segmental and somatic dysfunction of thoracic region: Secondary | ICD-10-CM | POA: Diagnosis not present

## 2016-12-27 DIAGNOSIS — M9903 Segmental and somatic dysfunction of lumbar region: Secondary | ICD-10-CM | POA: Diagnosis not present

## 2016-12-27 DIAGNOSIS — M9901 Segmental and somatic dysfunction of cervical region: Secondary | ICD-10-CM | POA: Diagnosis not present

## 2016-12-29 DIAGNOSIS — G8929 Other chronic pain: Secondary | ICD-10-CM | POA: Diagnosis not present

## 2016-12-29 DIAGNOSIS — M5441 Lumbago with sciatica, right side: Secondary | ICD-10-CM | POA: Diagnosis not present

## 2016-12-29 DIAGNOSIS — M5442 Lumbago with sciatica, left side: Secondary | ICD-10-CM | POA: Diagnosis not present

## 2016-12-29 DIAGNOSIS — M48061 Spinal stenosis, lumbar region without neurogenic claudication: Secondary | ICD-10-CM | POA: Diagnosis not present

## 2016-12-30 DIAGNOSIS — M9901 Segmental and somatic dysfunction of cervical region: Secondary | ICD-10-CM | POA: Diagnosis not present

## 2016-12-30 DIAGNOSIS — M62830 Muscle spasm of back: Secondary | ICD-10-CM | POA: Diagnosis not present

## 2016-12-30 DIAGNOSIS — M9903 Segmental and somatic dysfunction of lumbar region: Secondary | ICD-10-CM | POA: Diagnosis not present

## 2016-12-30 DIAGNOSIS — M9902 Segmental and somatic dysfunction of thoracic region: Secondary | ICD-10-CM | POA: Diagnosis not present

## 2017-01-03 DIAGNOSIS — M9902 Segmental and somatic dysfunction of thoracic region: Secondary | ICD-10-CM | POA: Diagnosis not present

## 2017-01-03 DIAGNOSIS — M62830 Muscle spasm of back: Secondary | ICD-10-CM | POA: Diagnosis not present

## 2017-01-03 DIAGNOSIS — M9901 Segmental and somatic dysfunction of cervical region: Secondary | ICD-10-CM | POA: Diagnosis not present

## 2017-01-03 DIAGNOSIS — M9903 Segmental and somatic dysfunction of lumbar region: Secondary | ICD-10-CM | POA: Diagnosis not present

## 2017-01-06 DIAGNOSIS — M62830 Muscle spasm of back: Secondary | ICD-10-CM | POA: Diagnosis not present

## 2017-01-06 DIAGNOSIS — M9901 Segmental and somatic dysfunction of cervical region: Secondary | ICD-10-CM | POA: Diagnosis not present

## 2017-01-06 DIAGNOSIS — M9903 Segmental and somatic dysfunction of lumbar region: Secondary | ICD-10-CM | POA: Diagnosis not present

## 2017-01-06 DIAGNOSIS — M9902 Segmental and somatic dysfunction of thoracic region: Secondary | ICD-10-CM | POA: Diagnosis not present

## 2017-01-10 DIAGNOSIS — H43812 Vitreous degeneration, left eye: Secondary | ICD-10-CM | POA: Diagnosis not present

## 2017-01-10 DIAGNOSIS — H348111 Central retinal vein occlusion, right eye, with retinal neovascularization: Secondary | ICD-10-CM | POA: Diagnosis not present

## 2017-01-10 DIAGNOSIS — H4311 Vitreous hemorrhage, right eye: Secondary | ICD-10-CM | POA: Diagnosis not present

## 2017-01-17 DIAGNOSIS — M9901 Segmental and somatic dysfunction of cervical region: Secondary | ICD-10-CM | POA: Diagnosis not present

## 2017-01-17 DIAGNOSIS — M62830 Muscle spasm of back: Secondary | ICD-10-CM | POA: Diagnosis not present

## 2017-01-17 DIAGNOSIS — M9903 Segmental and somatic dysfunction of lumbar region: Secondary | ICD-10-CM | POA: Diagnosis not present

## 2017-01-17 DIAGNOSIS — M9902 Segmental and somatic dysfunction of thoracic region: Secondary | ICD-10-CM | POA: Diagnosis not present

## 2017-01-20 DIAGNOSIS — H4311 Vitreous hemorrhage, right eye: Secondary | ICD-10-CM | POA: Diagnosis not present

## 2017-01-23 DIAGNOSIS — H4311 Vitreous hemorrhage, right eye: Secondary | ICD-10-CM | POA: Diagnosis not present

## 2017-01-23 DIAGNOSIS — H35041 Retinal micro-aneurysms, unspecified, right eye: Secondary | ICD-10-CM | POA: Diagnosis not present

## 2017-01-23 DIAGNOSIS — I341 Nonrheumatic mitral (valve) prolapse: Secondary | ICD-10-CM | POA: Diagnosis not present

## 2017-01-23 DIAGNOSIS — I639 Cerebral infarction, unspecified: Secondary | ICD-10-CM | POA: Diagnosis not present

## 2017-01-23 DIAGNOSIS — I10 Essential (primary) hypertension: Secondary | ICD-10-CM | POA: Diagnosis not present

## 2017-01-24 DIAGNOSIS — H35011 Changes in retinal vascular appearance, right eye: Secondary | ICD-10-CM | POA: Diagnosis not present

## 2017-01-24 DIAGNOSIS — H4311 Vitreous hemorrhage, right eye: Secondary | ICD-10-CM | POA: Diagnosis not present

## 2017-01-25 DIAGNOSIS — H44001 Unspecified purulent endophthalmitis, right eye: Secondary | ICD-10-CM | POA: Diagnosis not present

## 2017-01-26 DIAGNOSIS — H44001 Unspecified purulent endophthalmitis, right eye: Secondary | ICD-10-CM | POA: Diagnosis not present

## 2017-01-27 DIAGNOSIS — H44001 Unspecified purulent endophthalmitis, right eye: Secondary | ICD-10-CM | POA: Diagnosis not present

## 2017-01-30 DIAGNOSIS — H44001 Unspecified purulent endophthalmitis, right eye: Secondary | ICD-10-CM | POA: Diagnosis not present

## 2017-02-08 DIAGNOSIS — H44001 Unspecified purulent endophthalmitis, right eye: Secondary | ICD-10-CM | POA: Diagnosis not present

## 2017-02-23 DIAGNOSIS — H40051 Ocular hypertension, right eye: Secondary | ICD-10-CM | POA: Diagnosis not present

## 2017-02-23 DIAGNOSIS — H44001 Unspecified purulent endophthalmitis, right eye: Secondary | ICD-10-CM | POA: Diagnosis not present

## 2017-02-23 DIAGNOSIS — H25041 Posterior subcapsular polar age-related cataract, right eye: Secondary | ICD-10-CM | POA: Diagnosis not present

## 2017-02-23 DIAGNOSIS — H3341 Traction detachment of retina, right eye: Secondary | ICD-10-CM | POA: Diagnosis not present

## 2017-02-23 DIAGNOSIS — H348111 Central retinal vein occlusion, right eye, with retinal neovascularization: Secondary | ICD-10-CM | POA: Diagnosis not present

## 2017-02-24 DIAGNOSIS — M48061 Spinal stenosis, lumbar region without neurogenic claudication: Secondary | ICD-10-CM | POA: Diagnosis not present

## 2017-02-24 DIAGNOSIS — M5441 Lumbago with sciatica, right side: Secondary | ICD-10-CM | POA: Diagnosis not present

## 2017-02-24 DIAGNOSIS — M5442 Lumbago with sciatica, left side: Secondary | ICD-10-CM | POA: Diagnosis not present

## 2017-02-24 DIAGNOSIS — G8929 Other chronic pain: Secondary | ICD-10-CM | POA: Diagnosis not present

## 2017-02-25 DIAGNOSIS — J019 Acute sinusitis, unspecified: Secondary | ICD-10-CM | POA: Diagnosis not present

## 2017-03-03 DIAGNOSIS — J0140 Acute pansinusitis, unspecified: Secondary | ICD-10-CM | POA: Diagnosis not present

## 2017-03-08 DIAGNOSIS — J341 Cyst and mucocele of nose and nasal sinus: Secondary | ICD-10-CM | POA: Diagnosis not present

## 2017-03-08 DIAGNOSIS — R51 Headache: Secondary | ICD-10-CM | POA: Diagnosis not present

## 2017-03-14 DIAGNOSIS — F5102 Adjustment insomnia: Secondary | ICD-10-CM | POA: Diagnosis not present

## 2017-03-14 DIAGNOSIS — F4320 Adjustment disorder, unspecified: Secondary | ICD-10-CM | POA: Diagnosis not present

## 2017-03-14 DIAGNOSIS — H3321 Serous retinal detachment, right eye: Secondary | ICD-10-CM | POA: Diagnosis not present

## 2017-03-14 DIAGNOSIS — J341 Cyst and mucocele of nose and nasal sinus: Secondary | ICD-10-CM | POA: Diagnosis not present

## 2017-03-14 DIAGNOSIS — J320 Chronic maxillary sinusitis: Secondary | ICD-10-CM | POA: Diagnosis not present

## 2017-03-15 DIAGNOSIS — H4051X1 Glaucoma secondary to other eye disorders, right eye, mild stage: Secondary | ICD-10-CM | POA: Diagnosis not present

## 2017-03-15 DIAGNOSIS — H578 Other specified disorders of eye and adnexa: Secondary | ICD-10-CM | POA: Diagnosis not present

## 2017-03-15 DIAGNOSIS — H5710 Ocular pain, unspecified eye: Secondary | ICD-10-CM | POA: Diagnosis not present

## 2017-03-15 DIAGNOSIS — H5440 Blindness, one eye, unspecified eye: Secondary | ICD-10-CM | POA: Diagnosis not present

## 2017-03-15 DIAGNOSIS — H44001 Unspecified purulent endophthalmitis, right eye: Secondary | ICD-10-CM | POA: Diagnosis not present

## 2017-03-16 DIAGNOSIS — M62830 Muscle spasm of back: Secondary | ICD-10-CM | POA: Diagnosis not present

## 2017-03-16 DIAGNOSIS — M9903 Segmental and somatic dysfunction of lumbar region: Secondary | ICD-10-CM | POA: Diagnosis not present

## 2017-03-16 DIAGNOSIS — M9901 Segmental and somatic dysfunction of cervical region: Secondary | ICD-10-CM | POA: Diagnosis not present

## 2017-03-16 DIAGNOSIS — M9902 Segmental and somatic dysfunction of thoracic region: Secondary | ICD-10-CM | POA: Diagnosis not present

## 2017-03-21 DIAGNOSIS — J338 Other polyp of sinus: Secondary | ICD-10-CM | POA: Diagnosis not present

## 2017-03-21 DIAGNOSIS — K046 Periapical abscess with sinus: Secondary | ICD-10-CM | POA: Diagnosis not present

## 2017-03-23 DIAGNOSIS — M9903 Segmental and somatic dysfunction of lumbar region: Secondary | ICD-10-CM | POA: Diagnosis not present

## 2017-03-23 DIAGNOSIS — M9901 Segmental and somatic dysfunction of cervical region: Secondary | ICD-10-CM | POA: Diagnosis not present

## 2017-03-23 DIAGNOSIS — M62830 Muscle spasm of back: Secondary | ICD-10-CM | POA: Diagnosis not present

## 2017-03-23 DIAGNOSIS — M9902 Segmental and somatic dysfunction of thoracic region: Secondary | ICD-10-CM | POA: Diagnosis not present

## 2017-03-30 DIAGNOSIS — M9903 Segmental and somatic dysfunction of lumbar region: Secondary | ICD-10-CM | POA: Diagnosis not present

## 2017-03-30 DIAGNOSIS — M9902 Segmental and somatic dysfunction of thoracic region: Secondary | ICD-10-CM | POA: Diagnosis not present

## 2017-03-30 DIAGNOSIS — M62830 Muscle spasm of back: Secondary | ICD-10-CM | POA: Diagnosis not present

## 2017-03-30 DIAGNOSIS — M9901 Segmental and somatic dysfunction of cervical region: Secondary | ICD-10-CM | POA: Diagnosis not present

## 2017-04-03 DIAGNOSIS — H5711 Ocular pain, right eye: Secondary | ICD-10-CM | POA: Diagnosis not present

## 2017-04-05 DIAGNOSIS — H5440 Blindness, one eye, unspecified eye: Secondary | ICD-10-CM | POA: Diagnosis not present

## 2017-04-05 DIAGNOSIS — H4051X1 Glaucoma secondary to other eye disorders, right eye, mild stage: Secondary | ICD-10-CM | POA: Diagnosis not present

## 2017-04-05 DIAGNOSIS — H44001 Unspecified purulent endophthalmitis, right eye: Secondary | ICD-10-CM | POA: Diagnosis not present

## 2017-04-05 DIAGNOSIS — H5710 Ocular pain, unspecified eye: Secondary | ICD-10-CM | POA: Diagnosis not present

## 2017-04-05 DIAGNOSIS — H578 Other specified disorders of eye and adnexa: Secondary | ICD-10-CM | POA: Diagnosis not present

## 2017-04-06 DIAGNOSIS — M9901 Segmental and somatic dysfunction of cervical region: Secondary | ICD-10-CM | POA: Diagnosis not present

## 2017-04-06 DIAGNOSIS — M9902 Segmental and somatic dysfunction of thoracic region: Secondary | ICD-10-CM | POA: Diagnosis not present

## 2017-04-06 DIAGNOSIS — M9903 Segmental and somatic dysfunction of lumbar region: Secondary | ICD-10-CM | POA: Diagnosis not present

## 2017-04-06 DIAGNOSIS — M62830 Muscle spasm of back: Secondary | ICD-10-CM | POA: Diagnosis not present

## 2017-04-13 DIAGNOSIS — M9902 Segmental and somatic dysfunction of thoracic region: Secondary | ICD-10-CM | POA: Diagnosis not present

## 2017-04-13 DIAGNOSIS — M9903 Segmental and somatic dysfunction of lumbar region: Secondary | ICD-10-CM | POA: Diagnosis not present

## 2017-04-13 DIAGNOSIS — M62830 Muscle spasm of back: Secondary | ICD-10-CM | POA: Diagnosis not present

## 2017-04-13 DIAGNOSIS — M9901 Segmental and somatic dysfunction of cervical region: Secondary | ICD-10-CM | POA: Diagnosis not present

## 2017-04-18 DIAGNOSIS — I6523 Occlusion and stenosis of bilateral carotid arteries: Secondary | ICD-10-CM | POA: Diagnosis not present

## 2017-04-18 DIAGNOSIS — I6789 Other cerebrovascular disease: Secondary | ICD-10-CM | POA: Diagnosis not present

## 2017-04-19 DIAGNOSIS — M9901 Segmental and somatic dysfunction of cervical region: Secondary | ICD-10-CM | POA: Diagnosis not present

## 2017-04-19 DIAGNOSIS — M9903 Segmental and somatic dysfunction of lumbar region: Secondary | ICD-10-CM | POA: Diagnosis not present

## 2017-04-19 DIAGNOSIS — M9902 Segmental and somatic dysfunction of thoracic region: Secondary | ICD-10-CM | POA: Diagnosis not present

## 2017-04-19 DIAGNOSIS — M62830 Muscle spasm of back: Secondary | ICD-10-CM | POA: Diagnosis not present

## 2017-04-24 DIAGNOSIS — M9901 Segmental and somatic dysfunction of cervical region: Secondary | ICD-10-CM | POA: Diagnosis not present

## 2017-04-24 DIAGNOSIS — M9902 Segmental and somatic dysfunction of thoracic region: Secondary | ICD-10-CM | POA: Diagnosis not present

## 2017-04-24 DIAGNOSIS — M62830 Muscle spasm of back: Secondary | ICD-10-CM | POA: Diagnosis not present

## 2017-04-24 DIAGNOSIS — M9903 Segmental and somatic dysfunction of lumbar region: Secondary | ICD-10-CM | POA: Diagnosis not present

## 2017-04-25 DIAGNOSIS — M5442 Lumbago with sciatica, left side: Secondary | ICD-10-CM | POA: Diagnosis not present

## 2017-04-25 DIAGNOSIS — M5441 Lumbago with sciatica, right side: Secondary | ICD-10-CM | POA: Diagnosis not present

## 2017-04-25 DIAGNOSIS — M48061 Spinal stenosis, lumbar region without neurogenic claudication: Secondary | ICD-10-CM | POA: Diagnosis not present

## 2017-04-25 DIAGNOSIS — G8929 Other chronic pain: Secondary | ICD-10-CM | POA: Diagnosis not present

## 2017-05-01 DIAGNOSIS — M9902 Segmental and somatic dysfunction of thoracic region: Secondary | ICD-10-CM | POA: Diagnosis not present

## 2017-05-01 DIAGNOSIS — M9901 Segmental and somatic dysfunction of cervical region: Secondary | ICD-10-CM | POA: Diagnosis not present

## 2017-05-01 DIAGNOSIS — M9903 Segmental and somatic dysfunction of lumbar region: Secondary | ICD-10-CM | POA: Diagnosis not present

## 2017-05-01 DIAGNOSIS — M62830 Muscle spasm of back: Secondary | ICD-10-CM | POA: Diagnosis not present

## 2017-05-05 DIAGNOSIS — H5440 Blindness, one eye, unspecified eye: Secondary | ICD-10-CM | POA: Diagnosis not present

## 2017-05-05 DIAGNOSIS — H5710 Ocular pain, unspecified eye: Secondary | ICD-10-CM | POA: Diagnosis not present

## 2017-05-08 DIAGNOSIS — M62830 Muscle spasm of back: Secondary | ICD-10-CM | POA: Diagnosis not present

## 2017-05-08 DIAGNOSIS — M9901 Segmental and somatic dysfunction of cervical region: Secondary | ICD-10-CM | POA: Diagnosis not present

## 2017-05-08 DIAGNOSIS — M9902 Segmental and somatic dysfunction of thoracic region: Secondary | ICD-10-CM | POA: Diagnosis not present

## 2017-05-08 DIAGNOSIS — M9903 Segmental and somatic dysfunction of lumbar region: Secondary | ICD-10-CM | POA: Diagnosis not present

## 2017-05-17 DIAGNOSIS — M62830 Muscle spasm of back: Secondary | ICD-10-CM | POA: Diagnosis not present

## 2017-05-17 DIAGNOSIS — M9903 Segmental and somatic dysfunction of lumbar region: Secondary | ICD-10-CM | POA: Diagnosis not present

## 2017-05-17 DIAGNOSIS — M9901 Segmental and somatic dysfunction of cervical region: Secondary | ICD-10-CM | POA: Diagnosis not present

## 2017-05-17 DIAGNOSIS — M9902 Segmental and somatic dysfunction of thoracic region: Secondary | ICD-10-CM | POA: Diagnosis not present

## 2017-05-24 DIAGNOSIS — M62830 Muscle spasm of back: Secondary | ICD-10-CM | POA: Diagnosis not present

## 2017-05-24 DIAGNOSIS — M9903 Segmental and somatic dysfunction of lumbar region: Secondary | ICD-10-CM | POA: Diagnosis not present

## 2017-05-24 DIAGNOSIS — M9901 Segmental and somatic dysfunction of cervical region: Secondary | ICD-10-CM | POA: Diagnosis not present

## 2017-05-24 DIAGNOSIS — M9902 Segmental and somatic dysfunction of thoracic region: Secondary | ICD-10-CM | POA: Diagnosis not present

## 2017-06-07 DIAGNOSIS — M9901 Segmental and somatic dysfunction of cervical region: Secondary | ICD-10-CM | POA: Diagnosis not present

## 2017-06-07 DIAGNOSIS — M9903 Segmental and somatic dysfunction of lumbar region: Secondary | ICD-10-CM | POA: Diagnosis not present

## 2017-06-07 DIAGNOSIS — M9902 Segmental and somatic dysfunction of thoracic region: Secondary | ICD-10-CM | POA: Diagnosis not present

## 2017-06-07 DIAGNOSIS — M62830 Muscle spasm of back: Secondary | ICD-10-CM | POA: Diagnosis not present

## 2017-06-14 DIAGNOSIS — M9903 Segmental and somatic dysfunction of lumbar region: Secondary | ICD-10-CM | POA: Diagnosis not present

## 2017-06-14 DIAGNOSIS — M9902 Segmental and somatic dysfunction of thoracic region: Secondary | ICD-10-CM | POA: Diagnosis not present

## 2017-06-14 DIAGNOSIS — M9901 Segmental and somatic dysfunction of cervical region: Secondary | ICD-10-CM | POA: Diagnosis not present

## 2017-06-14 DIAGNOSIS — M62830 Muscle spasm of back: Secondary | ICD-10-CM | POA: Diagnosis not present

## 2017-06-21 DIAGNOSIS — M9903 Segmental and somatic dysfunction of lumbar region: Secondary | ICD-10-CM | POA: Diagnosis not present

## 2017-06-21 DIAGNOSIS — M9902 Segmental and somatic dysfunction of thoracic region: Secondary | ICD-10-CM | POA: Diagnosis not present

## 2017-06-21 DIAGNOSIS — M9901 Segmental and somatic dysfunction of cervical region: Secondary | ICD-10-CM | POA: Diagnosis not present

## 2017-06-21 DIAGNOSIS — M62830 Muscle spasm of back: Secondary | ICD-10-CM | POA: Diagnosis not present

## 2017-06-22 DIAGNOSIS — Z79891 Long term (current) use of opiate analgesic: Secondary | ICD-10-CM | POA: Diagnosis not present

## 2017-06-22 DIAGNOSIS — M5441 Lumbago with sciatica, right side: Secondary | ICD-10-CM | POA: Diagnosis not present

## 2017-06-22 DIAGNOSIS — M48061 Spinal stenosis, lumbar region without neurogenic claudication: Secondary | ICD-10-CM | POA: Diagnosis not present

## 2017-06-22 DIAGNOSIS — M5442 Lumbago with sciatica, left side: Secondary | ICD-10-CM | POA: Diagnosis not present

## 2017-06-22 DIAGNOSIS — G8929 Other chronic pain: Secondary | ICD-10-CM | POA: Diagnosis not present

## 2017-06-28 DIAGNOSIS — M9903 Segmental and somatic dysfunction of lumbar region: Secondary | ICD-10-CM | POA: Diagnosis not present

## 2017-06-28 DIAGNOSIS — M62830 Muscle spasm of back: Secondary | ICD-10-CM | POA: Diagnosis not present

## 2017-06-28 DIAGNOSIS — M9902 Segmental and somatic dysfunction of thoracic region: Secondary | ICD-10-CM | POA: Diagnosis not present

## 2017-06-28 DIAGNOSIS — M9901 Segmental and somatic dysfunction of cervical region: Secondary | ICD-10-CM | POA: Diagnosis not present

## 2017-06-29 DIAGNOSIS — H44001 Unspecified purulent endophthalmitis, right eye: Secondary | ICD-10-CM | POA: Diagnosis not present

## 2017-06-29 DIAGNOSIS — H3321 Serous retinal detachment, right eye: Secondary | ICD-10-CM | POA: Diagnosis not present

## 2017-06-29 DIAGNOSIS — H4051X1 Glaucoma secondary to other eye disorders, right eye, mild stage: Secondary | ICD-10-CM | POA: Diagnosis not present

## 2017-06-29 DIAGNOSIS — H269 Unspecified cataract: Secondary | ICD-10-CM | POA: Diagnosis not present

## 2017-07-05 DIAGNOSIS — M9902 Segmental and somatic dysfunction of thoracic region: Secondary | ICD-10-CM | POA: Diagnosis not present

## 2017-07-05 DIAGNOSIS — M9901 Segmental and somatic dysfunction of cervical region: Secondary | ICD-10-CM | POA: Diagnosis not present

## 2017-07-05 DIAGNOSIS — M62830 Muscle spasm of back: Secondary | ICD-10-CM | POA: Diagnosis not present

## 2017-07-05 DIAGNOSIS — M9903 Segmental and somatic dysfunction of lumbar region: Secondary | ICD-10-CM | POA: Diagnosis not present

## 2017-07-11 DIAGNOSIS — M9901 Segmental and somatic dysfunction of cervical region: Secondary | ICD-10-CM | POA: Diagnosis not present

## 2017-07-11 DIAGNOSIS — M9903 Segmental and somatic dysfunction of lumbar region: Secondary | ICD-10-CM | POA: Diagnosis not present

## 2017-07-11 DIAGNOSIS — M62830 Muscle spasm of back: Secondary | ICD-10-CM | POA: Diagnosis not present

## 2017-07-11 DIAGNOSIS — Z23 Encounter for immunization: Secondary | ICD-10-CM | POA: Diagnosis not present

## 2017-07-11 DIAGNOSIS — M9902 Segmental and somatic dysfunction of thoracic region: Secondary | ICD-10-CM | POA: Diagnosis not present

## 2017-07-18 DIAGNOSIS — M9903 Segmental and somatic dysfunction of lumbar region: Secondary | ICD-10-CM | POA: Diagnosis not present

## 2017-07-18 DIAGNOSIS — M9901 Segmental and somatic dysfunction of cervical region: Secondary | ICD-10-CM | POA: Diagnosis not present

## 2017-07-18 DIAGNOSIS — M9902 Segmental and somatic dysfunction of thoracic region: Secondary | ICD-10-CM | POA: Diagnosis not present

## 2017-07-18 DIAGNOSIS — M62830 Muscle spasm of back: Secondary | ICD-10-CM | POA: Diagnosis not present

## 2017-08-01 DIAGNOSIS — M9902 Segmental and somatic dysfunction of thoracic region: Secondary | ICD-10-CM | POA: Diagnosis not present

## 2017-08-01 DIAGNOSIS — M62830 Muscle spasm of back: Secondary | ICD-10-CM | POA: Diagnosis not present

## 2017-08-01 DIAGNOSIS — M9901 Segmental and somatic dysfunction of cervical region: Secondary | ICD-10-CM | POA: Diagnosis not present

## 2017-08-01 DIAGNOSIS — M9903 Segmental and somatic dysfunction of lumbar region: Secondary | ICD-10-CM | POA: Diagnosis not present

## 2017-08-09 DIAGNOSIS — M9903 Segmental and somatic dysfunction of lumbar region: Secondary | ICD-10-CM | POA: Diagnosis not present

## 2017-08-09 DIAGNOSIS — M9901 Segmental and somatic dysfunction of cervical region: Secondary | ICD-10-CM | POA: Diagnosis not present

## 2017-08-09 DIAGNOSIS — M62830 Muscle spasm of back: Secondary | ICD-10-CM | POA: Diagnosis not present

## 2017-08-09 DIAGNOSIS — M9902 Segmental and somatic dysfunction of thoracic region: Secondary | ICD-10-CM | POA: Diagnosis not present

## 2017-08-25 ENCOUNTER — Inpatient Hospital Stay
Admission: EM | Admit: 2017-08-25 | Discharge: 2017-08-27 | DRG: 871 | Disposition: A | Discharge status: Home / Self Care | Payer: Medicare Other | Attending: Internal Medicine | Admitting: Internal Medicine

## 2017-08-25 ENCOUNTER — Encounter: Payer: Self-pay | Admitting: Emergency Medicine

## 2017-08-25 ENCOUNTER — Emergency Department: Payer: Medicare Other

## 2017-08-25 ENCOUNTER — Other Ambulatory Visit: Payer: Self-pay

## 2017-08-25 DIAGNOSIS — J9611 Chronic respiratory failure with hypoxia: Secondary | ICD-10-CM | POA: Diagnosis present

## 2017-08-25 DIAGNOSIS — Z882 Allergy status to sulfonamides status: Secondary | ICD-10-CM | POA: Diagnosis not present

## 2017-08-25 DIAGNOSIS — R000 Tachycardia, unspecified: Secondary | ICD-10-CM | POA: Diagnosis not present

## 2017-08-25 DIAGNOSIS — J189 Pneumonia, unspecified organism: Secondary | ICD-10-CM | POA: Diagnosis not present

## 2017-08-25 DIAGNOSIS — M4800 Spinal stenosis, site unspecified: Secondary | ICD-10-CM | POA: Diagnosis present

## 2017-08-25 DIAGNOSIS — G8929 Other chronic pain: Secondary | ICD-10-CM | POA: Diagnosis present

## 2017-08-25 DIAGNOSIS — F17200 Nicotine dependence, unspecified, uncomplicated: Secondary | ICD-10-CM | POA: Diagnosis present

## 2017-08-25 DIAGNOSIS — I10 Essential (primary) hypertension: Secondary | ICD-10-CM | POA: Diagnosis present

## 2017-08-25 DIAGNOSIS — R05 Cough: Secondary | ICD-10-CM | POA: Diagnosis not present

## 2017-08-25 DIAGNOSIS — J441 Chronic obstructive pulmonary disease with (acute) exacerbation: Secondary | ICD-10-CM | POA: Diagnosis not present

## 2017-08-25 DIAGNOSIS — Z881 Allergy status to other antibiotic agents status: Secondary | ICD-10-CM

## 2017-08-25 DIAGNOSIS — R0602 Shortness of breath: Secondary | ICD-10-CM | POA: Diagnosis not present

## 2017-08-25 DIAGNOSIS — Z888 Allergy status to other drugs, medicaments and biological substances status: Secondary | ICD-10-CM

## 2017-08-25 DIAGNOSIS — Z886 Allergy status to analgesic agent status: Secondary | ICD-10-CM

## 2017-08-25 DIAGNOSIS — Z7902 Long term (current) use of antithrombotics/antiplatelets: Secondary | ICD-10-CM | POA: Diagnosis not present

## 2017-08-25 DIAGNOSIS — M419 Scoliosis, unspecified: Secondary | ICD-10-CM | POA: Diagnosis present

## 2017-08-25 DIAGNOSIS — A419 Sepsis, unspecified organism: Principal | ICD-10-CM | POA: Diagnosis present

## 2017-08-25 DIAGNOSIS — Z79899 Other long term (current) drug therapy: Secondary | ICD-10-CM | POA: Diagnosis not present

## 2017-08-25 DIAGNOSIS — J181 Lobar pneumonia, unspecified organism: Secondary | ICD-10-CM | POA: Diagnosis present

## 2017-08-25 DIAGNOSIS — J440 Chronic obstructive pulmonary disease with acute lower respiratory infection: Secondary | ICD-10-CM | POA: Diagnosis present

## 2017-08-25 HISTORY — DX: Chronic obstructive pulmonary disease, unspecified: J44.9

## 2017-08-25 HISTORY — DX: Essential (primary) hypertension: I10

## 2017-08-25 HISTORY — DX: Scoliosis, unspecified: M41.9

## 2017-08-25 HISTORY — DX: Spinal stenosis, site unspecified: M48.00

## 2017-08-25 LAB — PROCALCITONIN: Procalcitonin: <0.1 ng/mL

## 2017-08-25 LAB — BASIC METABOLIC PANEL
Anion gap: 11 (ref 5–15)
BUN: 13 mg/dL (ref 6–20)
CO2: 25 mmol/L (ref 22–32)
CREATININE: 0.78 mg/dL (ref 0.44–1.00)
Calcium: 9.1 mg/dL (ref 8.9–10.3)
Chloride: 103 mmol/L (ref 101–111)
GFR calc Af Amer: >60 mL/min (ref >60)
GLUCOSE: 117 mg/dL — AB (ref 65–99)
Potassium: 3.6 mmol/L (ref 3.5–5.1)
SODIUM: 139 mmol/L (ref 135–145)

## 2017-08-25 LAB — CBC
HCT: 40.2 % (ref 35.0–47.0)
Hemoglobin: 13.3 g/dL (ref 12.0–16.0)
MCH: 31.7 pg (ref 26.0–34.0)
MCHC: 33.1 g/dL (ref 32.0–36.0)
MCV: 95.9 fL (ref 80.0–100.0)
PLATELETS: 329 10*3/uL (ref 150–440)
RBC: 4.19 MIL/uL (ref 3.80–5.20)
RDW: 13.1 % (ref 11.5–14.5)
WBC: 15.1 10*3/uL — AB (ref 3.6–11.0)

## 2017-08-25 LAB — INFLUENZA PANEL BY PCR (TYPE A & B)
INFLAPCR: NEGATIVE
Influenza B By PCR: NEGATIVE

## 2017-08-25 LAB — LACTIC ACID, PLASMA: LACTIC ACID, VENOUS: 0.9 mmol/L (ref 0.5–1.9)

## 2017-08-25 LAB — TROPONIN I: Troponin I: <0.03 ng/mL (ref <0.03)

## 2017-08-25 MED ORDER — B COMPLEX-C PO TABS
1.0000 | ORAL_TABLET | Freq: Every day | ORAL | Status: DC
Start: 2017-08-26 — End: 2017-08-27
  Administered 2017-08-26 – 2017-08-27 (×2): 1 via ORAL
  Filled 2017-08-25 (×2): qty 1

## 2017-08-25 MED ORDER — METHYLPREDNISOLONE SODIUM SUCC 40 MG IJ SOLR
40.0000 mg | Freq: Every day | INTRAMUSCULAR | Status: DC
  Administered 2017-08-26 – 2017-08-27 (×2): 40 mg via INTRAVENOUS
  Filled 2017-08-25 (×2): qty 1

## 2017-08-25 MED ORDER — GABAPENTIN 100 MG PO CAPS
100.0000 mg | ORAL_CAPSULE | Freq: Two times a day (BID) | ORAL | Status: DC
Start: 2017-08-25 — End: 2017-08-27
  Administered 2017-08-25 – 2017-08-27 (×4): 100 mg via ORAL
  Filled 2017-08-25 (×4): qty 1

## 2017-08-25 MED ORDER — ACETAMINOPHEN 500 MG PO TABS
1000.0000 mg | ORAL_TABLET | Freq: Once | ORAL | Status: AC
  Administered 2017-08-25: 1000 mg via ORAL
  Filled 2017-08-25: qty 2

## 2017-08-25 MED ORDER — CLONAZEPAM 0.5 MG PO TABS
0.5000 mg | ORAL_TABLET | Freq: Two times a day (BID) | ORAL | Status: DC | PRN
  Administered 2017-08-26: 19:00:00 0.5 mg via ORAL
  Filled 2017-08-25: qty 1

## 2017-08-25 MED ORDER — SODIUM CHLORIDE 0.9 % IV BOLUS (SEPSIS)
1000.0000 mL | Freq: Once | INTRAVENOUS | Status: AC
  Administered 2017-08-25: 1000 mL via INTRAVENOUS

## 2017-08-25 MED ORDER — LIDOCAINE 5 % EX PTCH
1.0000 | MEDICATED_PATCH | CUTANEOUS | Status: DC
  Administered 2017-08-26 – 2017-08-27 (×2): 1 via TRANSDERMAL
  Filled 2017-08-25 (×2): qty 1

## 2017-08-25 MED ORDER — CILOSTAZOL 100 MG PO TABS
100.0000 mg | ORAL_TABLET | Freq: Two times a day (BID) | ORAL | Status: DC
  Administered 2017-08-25 – 2017-08-27 (×4): 100 mg via ORAL
  Filled 2017-08-25 (×5): qty 1

## 2017-08-25 MED ORDER — BUDESONIDE 0.5 MG/2ML IN SUSP
0.5000 mg | Freq: Two times a day (BID) | RESPIRATORY_TRACT | Status: DC
  Administered 2017-08-25: 19:00:00 0.5 mg via RESPIRATORY_TRACT
  Filled 2017-08-25 (×2): qty 2

## 2017-08-25 MED ORDER — ALBUTEROL SULFATE (2.5 MG/3ML) 0.083% IN NEBU: 5.0000 mg | INHALATION_SOLUTION | Freq: Once | RESPIRATORY_TRACT | Status: DC

## 2017-08-25 MED ORDER — BENAZEPRIL HCL 20 MG PO TABS
40.0000 mg | ORAL_TABLET | Freq: Every day | ORAL | Status: DC
  Administered 2017-08-27: 40 mg via ORAL
  Filled 2017-08-25 (×2): qty 2

## 2017-08-25 MED ORDER — OXYCODONE HCL 5 MG PO TABS
5.0000 mg | ORAL_TABLET | ORAL | Status: DC | PRN
Start: 2017-08-25 — End: 2017-08-27
  Administered 2017-08-26: 5 mg via ORAL
  Filled 2017-08-25: qty 1

## 2017-08-25 MED ORDER — IPRATROPIUM-ALBUTEROL 0.5-2.5 (3) MG/3ML IN SOLN
3.0000 mL | Freq: Four times a day (QID) | RESPIRATORY_TRACT | Status: DC
  Administered 2017-08-25 – 2017-08-26 (×2): 3 mL via RESPIRATORY_TRACT
  Filled 2017-08-25 (×3): qty 3

## 2017-08-25 MED ORDER — ADULT MULTIVITAMIN W/MINERALS CH
1.0000 | ORAL_TABLET | Freq: Every day | ORAL | Status: DC
Start: 2017-08-26 — End: 2017-08-26
  Administered 2017-08-26: 1 via ORAL
  Filled 2017-08-25: qty 1

## 2017-08-25 MED ORDER — IPRATROPIUM-ALBUTEROL 0.5-2.5 (3) MG/3ML IN SOLN
3.0000 mL | Freq: Once | RESPIRATORY_TRACT | Status: AC
  Administered 2017-08-25: 3 mL via RESPIRATORY_TRACT
  Filled 2017-08-25: qty 3

## 2017-08-25 MED ORDER — LEVOFLOXACIN IN D5W 750 MG/150ML IV SOLN
750.0000 mg | INTRAVENOUS | Status: DC
  Administered 2017-08-27: 750 mg via INTRAVENOUS
  Filled 2017-08-25: qty 150

## 2017-08-25 MED ORDER — ACETAMINOPHEN 325 MG PO TABS: 650.0000 mg | ORAL_TABLET | Freq: Four times a day (QID) | ORAL | Status: DC | PRN

## 2017-08-25 MED ORDER — METHYLPREDNISOLONE SODIUM SUCC 125 MG IJ SOLR
125.0000 mg | Freq: Once | INTRAMUSCULAR | Status: AC
  Administered 2017-08-25: 125 mg via INTRAVENOUS
  Filled 2017-08-25: qty 2

## 2017-08-25 MED ORDER — LEVOFLOXACIN IN D5W 750 MG/150ML IV SOLN
750.0000 mg | Freq: Once | INTRAVENOUS | Status: AC
  Administered 2017-08-25: 750 mg via INTRAVENOUS
  Filled 2017-08-25: qty 150

## 2017-08-25 MED ORDER — NIACINAMIDE 100 MG PO TABS: 100.0000 mg | ORAL_TABLET | Freq: Two times a day (BID) | ORAL | Status: DC

## 2017-08-25 MED ORDER — CEFTRIAXONE SODIUM IN DEXTROSE 20 MG/ML IV SOLN
1.0000 g | Freq: Once | INTRAVENOUS | Status: AC
  Administered 2017-08-25: 1 g via INTRAVENOUS
  Filled 2017-08-25: qty 50

## 2017-08-25 MED ORDER — ACETAMINOPHEN 650 MG RE SUPP: 650.0000 mg | Freq: Four times a day (QID) | RECTAL | Status: DC | PRN

## 2017-08-25 MED ORDER — AZITHROMYCIN 500 MG PO TABS
500.0000 mg | ORAL_TABLET | Freq: Once | ORAL | Status: DC
  Filled 2017-08-25: qty 1

## 2017-08-25 MED ORDER — ENOXAPARIN SODIUM 40 MG/0.4ML ~~LOC~~ SOLN
40.0000 mg | SUBCUTANEOUS | Status: DC
  Filled 2017-08-25 (×2): qty 0.4

## 2017-08-25 NOTE — H&P (Signed)
[facility] [employer] NAME: Myan Locatelli    MR#:  [id number]  DATE OF BIRTH:  [birth year]  DATE OF ADMISSION:  08/25/2017  PRIMARY CARE PHYSICIAN: Patient, No Pcp Per   REQUESTING/REFERRING PHYSICIAN: Dr Gonzella Lex  CHIEF COMPLAINT:   Chief Complaint  Patient presents with  . Cough  . Shortness of Breath    HISTORY OF PRESENT ILLNESS:  Alika Eppes  is a 77 y.o. female with a known history of COPD and hypertension.  She presents with feeling horrible since she came here.  She is weak.  She is coughing up green phlegm.  She always has a history of sinus issues.  She has upper back pain.  She is short of breath with limited activity.  In the ER, she was found to be tachycardic, fever, leukocytosis and chest x-ray showing pneumonia.  Hospitalist services were contacted for further evaluation.  PAST MEDICAL HISTORY:   Past Medical History:  Diagnosis Date  . COPD (chronic obstructive pulmonary disease) ([place])   . Hypertension   . Scoliosis   . Spinal stenosis     PAST SURGICAL HISTORY:   Past Surgical History:  Procedure Laterality Date  . ABDOMINAL HYSTERECTOMY    . BACK SURGERY    . right eye surgery    . WISDOM TOOTH EXTRACTION      SOCIAL HISTORY:   Social History   Tobacco Use  . Smoking status: Current Every Day Smoker  . Smokeless tobacco: Never Used  Substance Use Topics  . Alcohol use: No    Frequency: Never    FAMILY HISTORY:   Family History  Problem Relation Age of Onset  . Intracerebral hemorrhage Mother   . Hypertension Mother   . Heart failure Father     DRUG ALLERGIES:   Allergies  Allergen Reactions  . Amlodipine Hives  . Azithromycin Hives  . Doxycycline Diarrhea  . Erythromycin Diarrhea  . Hydrocodone Nausea Only  . Minocycline Diarrhea  . Nsaids Other (See Comments)    Internal Bleeding  . Relafen [Nabumetone] Hives  . Sulfa Antibiotics Hives  . Tetracyclines &  Related Diarrhea    REVIEW OF SYSTEMS:  CONSTITUTIONAL: Positive for fever and fatigue.  No chills or sweats. EYES: No blurred or double vision.  Wears glasses.  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 EARS, NOSE, AND THROAT: No tinnitus or ear pain. No sore throat RESPIRATORY: Positive for cough, shortness of breath, and wheezing.  No hemoptysis.  CARDIOVASCULAR: No chest pain, orthopnea, edema.  GASTROINTESTINAL: Positive for nausea, vomiting.  No diarrhea or abdominal pain. No blood in bowel movements.  Some constipation GENITOURINARY: No dysuria, hematuria.  ENDOCRINE: No polyuria, nocturia,  HEMATOLOGY: No anemia, easy bruising or bleeding SKIN: No rash or lesion. MUSCULOSKELETAL: Chronic back pain NEUROLOGIC: No tingling, numbness, weakness.  PSYCHIATRY: No anxiety or depression.   MEDICATIONS AT HOME:   Prior to Admission medications   Medication Sig Start Date End Date Taking? Authorizing Provider  B-COMPLEX-C PO Take 1 tablet by mouth daily.   Yes [provider]  benazepril (LOTENSIN) 40 MG tablet Take 40 mg by mouth daily.   Yes [provider]  cilostazol (PLETAL) 100 MG tablet Take 100 mg by mouth 2 (two) times daily.   Yes [provider]  clonazePAM (KLONOPIN) 0.5 MG tablet Take 0.5 mg by mouth 2 (two) times daily as needed for anxiety.   Yes [provider]  gabapentin (  NEURONTIN) 100 MG capsule Take 100 mg by mouth 2 (two) times daily.   Yes [provider]  lidocaine (LIDODERM) 5 % Place 1 patch onto the skin daily. Remove & Discard patch within 12 hours or as directed by MD   Yes [provider]  Multiple Vitamins-Minerals (MULTIVITAMIN WITH MINERALS) tablet Take 1 tablet by mouth daily.   Yes [provider]  niacinamide 100 MG tablet Take 100 mg by mouth 2 (two) times daily with a meal.   Yes [provider]  oxyCODONE (OXY IR/ROXICODONE) 5 MG immediate release tablet Take 5 mg by mouth  every 4 (four) hours as needed for severe pain.   Yes [provider]  pseudoephedrine (SUDAFED) 30 MG tablet Take 30 mg by mouth every 4 (four) hours as needed for congestion.   Yes [provider]      VITAL SIGNS:  Blood pressure (!) 139/58, pulse (!) 129, temperature 98.9 F (37.2 C), temperature source Oral, resp. rate 20, height 5\' 3"  (1.6 m), weight 48.1 kg (106 lb), SpO2 93 %.  PHYSICAL EXAMINATION:  GENERAL:  77 y.o.-year-old patient lying in the bed with no acute distress.  EYES: Pupils equal, round, reactive to light and accommodation. No scleral icterus. Extraocular muscles intact.  HEENT: Head atraumatic, normocephalic. Oropharynx and nasopharynx clear.  NECK:  Supple, no jugular venous distention. No thyroid enlargement, no tenderness.  LUNGS: Decreased breath sounds bilaterally, positive upper airway expiratory wheezing.  Positive rhonchi bilateral bases tachycardic. No use of accessory muscles of respiration.  CARDIOVASCULAR: S1, S2 tachycardic. No murmurs, rubs, or gallops.  ABDOMEN: Soft, nontender, nondistended. Bowel sounds present. No organomegaly or mass.  EXTREMITIES: No pedal edema, cyanosis, or clubbing.  NEUROLOGIC: Cranial nerves II through XII are intact. Muscle strength 5/5 in all extremities. Sensation intact. Gait not checked.  PSYCHIATRIC: The patient is alert and oriented x 3.  SKIN: No rash, lesion, or ulcer.   LABORATORY PANEL:   CBC Recent Labs  Lab 08/25/17 1141  WBC 15.1*  HGB 13.3  HCT 40.2  PLT 329   ------------------------------------------------------------------------------------------------------------------  Chemistries  Recent Labs  Lab 08/25/17 1141  NA 139  K 3.6  CL 103  CO2 25  GLUCOSE 117*  BUN 13  CREATININE 0.78  CALCIUM 9.1   ------------------------------------------------------------------------------------------------------------------  Cardiac Enzymes Recent Labs  Lab 08/25/17 1141   TROPONINI <0.03   ------------------------------------------------------------------------------------------------------------------  RADIOLOGY:  Dg Chest 2 View  Result Date: 08/25/2017 CLINICAL DATA:  Shortness of breath.  Productive cough. EXAM: CHEST  2 VIEW COMPARISON:  Two-view chest x-ray 09/10/2012 and 09/19/2008. FINDINGS: Nodular densities in the right upper lobe are stable. The heart size is normal. Bilateral lower lobe airspace disease is new. Small effusions are present bilaterally. There is no edema. Severe levoconvex scoliosis at the thoracolumbar junction is again noted. IMPRESSION: 1. New bilateral lower lobe airspace disease and effusions compatible with lobar pneumonia. 2. Stable nodular densities in the right upper lobe. 3. Scoliosis. Electronically Signed   By: San Morelle M.D.   On: 08/25/2017 12:39    EKG:   Sinus tachycardia 130 bpm, left atrial enlargement, low voltage, right bundle branch block, Q waves inferiorly  IMPRESSION AND PLAN:   1.  Clinical sepsis with pneumonia, leukocytosis, tachycardia and fever.  Since the patient has allergy to atypical medications I will give Levaquin.  The patient was given Rocephin in the emergency room.  Sputum culture if able. 2.  COPD exacerbation start Solu-Medrol 40 mg daily and  budesonide and DuoNeb nebulizer solution 3.  Tachycardia which is chronic for this patient.  She does not want any rate controlling medications. 4.  Hypertension continue her usual benazepril 5.  Scoliosis and spinal stenosis and chronic back pain.  On oxycodone    All the records are reviewed and case discussed with ED provider. Management plans discussed with the patient, family and they are in agreement.  CODE STATUS: Full code  TOTAL TIME TAKING CARE OF THIS PATIENT: 50 minutes.    [name] M.D on 08/25/2017 at 3:20 PM  Between 7am to 6pm - Pager - [phone]  After 6pm call admission pager [phone]  [facility]  [phone]  CC: Primary care physician; Patient, No Pcp Per

## 2017-08-25 NOTE — ED Notes (Signed)
ED Provider at bedside. 

## 2017-08-25 NOTE — Progress Notes (Signed)
Pharmacy Antibiotic Note  Savannah Mcclure is a 77 y.o. female admitted on 08/25/2017 with pneumonia and sepsis.  Pharmacy has been consulted for levaquin dosing.  Plan: Will dose Levaquin as 750 mg IV x1 for ED then q48h based on indication and renal function.   Height: 5\' 3"  (160 cm) Weight: 106 lb (48.1 kg) IBW/kg (Calculated) : 52.4  Temp (24hrs), Avg:99.6 F (37.6 C), Min:98.9 F (37.2 C), Max:100.3 F (37.9 C)  Recent Labs  Lab 08/25/17 1141 08/25/17 1405  WBC 15.1*  --   CREATININE 0.78  --   LATICACIDVEN  --  0.9    Estimated Creatinine Clearance: 44.7 mL/min (by C-G formula based on SCr of 0.78 mg/dL).    Allergies  Allergen Reactions  . Amlodipine Hives  . Azithromycin Hives  . Doxycycline Diarrhea  . Erythromycin Diarrhea  . Hydrocodone Nausea Only  . Minocycline Diarrhea  . Nsaids Other (See Comments)    Internal Bleeding  . Relafen [Nabumetone] Hives  . Sulfa Antibiotics Hives  . Tetracyclines & Related Diarrhea    Antimicrobials this admission: Ceftriaxone 1/11 x1 Levaquin 1/11 >>  Dose adjustments this admission:   Microbiology results: 1/11 BCx: sent   Thank you for allowing pharmacy to be a part of this patient's care.  Rocky Morel 08/25/2017 3:22 PM

## 2017-08-25 NOTE — ED Provider Notes (Signed)
[facility] Emergency Department Provider Note  ____________________________________________  Time seen: Approximately 2:10 PM  I have reviewed the triage vital signs and the nursing notes.   HISTORY  Chief Complaint Cough and Shortness of Breath   HPI Savannah Mcclure is a 77 y.o. female with a history of COPD on 2 L nasal cannula at bedtime who presents for evaluation of shortness of breath and cough. Patient reports a productive cough since Christmas time. She has had progressively worsening shortness of breath which became severe and constant, worse with minimal exertion over the last 2 days. Has had wheezing. Since yesterday patient has had malaise and generalized weakness and yesterday was unable to get up from the bed. Has had chills but no fever. Has had nausea but no vomiting or diarrhea. Patient denies chest pain, abdominal pain, dysuria or hematuria. She is up-to-date with her flu shot but needs her pneumonia shot. Patient recently moved here from [place] and has not established care with a primary care doctor.She has been using her oxygen constantly for the last 2 days.  Past Medical History:  Diagnosis Date  . COPD (chronic obstructive pulmonary disease) (HCC)     There are no active problems to display for this patient.   Past Surgical History:  Procedure Laterality Date  . ABDOMINAL HYSTERECTOMY      Prior to Admission medications   Not on File    Allergies Amlodipine; Azithromycin; Doxycycline; Erythromycin; Hydrocodone; Minocycline; Nsaids; Relafen [nabumetone]; Sulfa antibiotics; and Tetracyclines & related  No family history on file.  Social History Social History   Tobacco Use  . Smoking status: Current Every Day Smoker  . Smokeless tobacco: Never Used  Substance Use Topics  . Alcohol use: Not on file  . Drug use: Not on file    Review of Systems  Constitutional: Negative for fever. Eyes: Negative for visual  changes. ENT: Negative for sore throat. Neck: No neck pain  Cardiovascular: Negative for chest pain. Respiratory: +r shortness of breath and cough Gastrointestinal: Negative for abdominal pain, vomiting or diarrhea. + nausea Genitourinary: Negative for dysuria. Musculoskeletal: Negative for back pain. Skin: Negative for rash. Neurological: Negative for headaches, weakness or numbness. Psych: No SI or HI  ____________________________________________   PHYSICAL EXAM:  VITAL SIGNS: ED Triage Vitals  Enc Vitals Group     BP 08/25/17 1402 (!) 151/87     Pulse Rate 08/25/17 1402 (!) 126     Resp 08/25/17 1402 (!) 24     Temp 08/25/17 1402 100.3 F (37.9 C)     Temp Source 08/25/17 1402 Oral     SpO2 08/25/17 1402 100 %     Weight --      Height --      Head Circumference --      Peak Flow --      Pain Score 08/25/17 1130 3     Pain Loc --      Pain Edu? --      Excl. in [place]? --     Constitutional: Alert and oriented. Well appearing and in no apparent distress. HEENT:      Head: Normocephalic and atraumatic.         Eyes: Conjunctivae are normal. Sclera is non-icteric.       Mouth/Throat: Mucous membranes are moist.       Neck: Supple with no signs of meningismus. Cardiovascular: Tachycardic with regular rhythm. No murmurs, gallops, or rubs. 2+ symmetrical distal pulses are present in  all extremities. No JVD. Respiratory: Normal respiratory effort. Lungs are clear to auscultation bilaterally with minimally reduced air movement. No wheezes, crackles, or rhonchi.  Gastrointestinal: Soft, non tender, and non distended with positive bowel sounds. No rebound or guarding. Musculoskeletal: Nontender with normal range of motion in all extremities. No edema, cyanosis, or erythema of extremities. Neurologic: Normal speech and language. Face is symmetric. Moving all extremities. No gross focal neurologic deficits are appreciated. Skin: Skin is warm, dry and intact. No rash  noted. Psychiatric: Mood and affect are normal. Speech and behavior are normal.  ____________________________________________   LABS (all labs ordered are listed, but only abnormal results are displayed)  Labs Reviewed  BASIC METABOLIC PANEL - Abnormal; Notable for the following components:      Result Value   Glucose, Bld 117 (*)    All other components within normal limits  CBC - Abnormal; Notable for the following components:   WBC 15.1 (*)    All other components within normal limits  CULTURE, BLOOD (ROUTINE X 2)  CULTURE, BLOOD (ROUTINE X 2)  TROPONIN I  URINALYSIS, ROUTINE W REFLEX MICROSCOPIC  LACTIC ACID, PLASMA  LACTIC ACID, PLASMA  INFLUENZA PANEL BY PCR (TYPE A & B)   ____________________________________________  EKG  ED ECG REPORT I, Rudene Re, the attending physician, personally viewed and interpreted this ECG.  Sinus tachycardia, rate of 130, right bundle branch block, right axis deviation, no ST elevations or depressions. Unchanged from prior from 2014 ____________________________________________  RADIOLOGY  CXR: 1. New bilateral lower lobe airspace disease and effusions compatible with lobar pneumonia. 2. Stable nodular densities in the right upper lobe. 3. Scoliosis. ____________________________________________   PROCEDURES  Procedure(s) performed: None Procedures Critical Care performed: yes  CRITICAL CARE Performed by: Rudene Re  ?  Total critical care time: 40 min  Critical care time was exclusive of separately billable procedures and treating other patients.  Critical care was necessary to treat or prevent imminent or life-threatening deterioration.  Critical care was time spent personally by me on the following activities: development of treatment plan with patient and/or surrogate as well as nursing, discussions with consultants, evaluation of patient's response to treatment, examination of patient, obtaining history  from patient or surrogate, ordering and performing treatments and interventions, ordering and review of laboratory studies, ordering and review of radiographic studies, pulse oximetry and re-evaluation of patient's condition.  ____________________________________________   INITIAL IMPRESSION / ASSESSMENT AND PLAN / ED COURSE  77 y.o. female with a history of COPD on 2 L nasal cannula at bedtime who presents for evaluation of shortness of breath and cough. Patient is tachycardic, tachypnea, with a fever of 100.60F, normal sats on room air, slightly diminished air movement bilaterally with no crackles or wheezes. Labs showing a white count of 15.1, chest x-ray concerning with bibasilar lobar pneumonia. Patient will be given DuoNeb, Solu-Medrol, Rocephin, azithromycin, fluids, Tylenol, and will be admitted to the hospitalist service for sepsis and community-acquired pneumonia.       As part of my medical decision making, I reviewed the following data within the [place] notes reviewed and incorporated, Labs reviewed , EKG interpreted , Old EKG reviewed, Radiograph reviewed , Discussed with admitting physician , Notes from prior ED visits and [place] Controlled Substance Database    Pertinent labs & imaging results that were available during my care of the patient were reviewed by me and considered in my medical decision making (see chart for details).    ____________________________________________  FINAL CLINICAL IMPRESSION(S) / ED DIAGNOSES  Final diagnoses:  Sepsis, due to unspecified organism [facility])  Community acquired pneumonia, unspecified laterality  COPD exacerbation ([place])      NEW MEDICATIONS STARTED DURING THIS VISIT:  ED Discharge Orders    None       Note:  This document was prepared using Dragon voice recognition software and may include unintentional dictation errors.    Alfred Levins, [name], MD 08/25/17 1434

## 2017-08-25 NOTE — ED Notes (Signed)
Pt given [place] sandwich and drink. Watching television at this time.

## 2017-08-25 NOTE — ED Notes (Signed)
Report to Dana, RN

## 2017-08-25 NOTE — ED Notes (Signed)
Pt eating dinner tray °

## 2017-08-25 NOTE — ED Triage Notes (Signed)
Pt arrived via POV.  Pt recently moved from [place]. Cold sxs started 7 days ago. Pt has had productive cough that is thick and green, pt reports shortness of breath as well. Pt uses 2L [place] at night, pt states she has been using oxygen continuously at home for the past week.  Pt has chronic back pain. Pt states she has been having a poor appetite as well.

## 2017-08-25 NOTE — ED Notes (Signed)
Pt urinated in toilet for urine sample; missed hat.

## 2017-08-26 LAB — BASIC METABOLIC PANEL
Anion gap: 9 (ref 5–15)
BUN: 18 mg/dL (ref 6–20)
CALCIUM: 8.3 mg/dL — AB (ref 8.9–10.3)
CHLORIDE: 105 mmol/L (ref 101–111)
CO2: 21 mmol/L — ABNORMAL LOW (ref 22–32)
CREATININE: 0.93 mg/dL (ref 0.44–1.00)
GFR calc non Af Amer: 58 mL/min — ABNORMAL LOW (ref >60)
Glucose, Bld: 244 mg/dL — ABNORMAL HIGH (ref 65–99)
Potassium: 2.9 mmol/L — ABNORMAL LOW (ref 3.5–5.1)
Sodium: 135 mmol/L (ref 135–145)

## 2017-08-26 LAB — URINALYSIS, ROUTINE W REFLEX MICROSCOPIC
Bilirubin Urine: NEGATIVE
GLUCOSE, UA: 50 mg/dL — AB
HGB URINE DIPSTICK: NEGATIVE
KETONES UR: NEGATIVE mg/dL
LEUKOCYTES UA: NEGATIVE
Nitrite: NEGATIVE
PH: 5 (ref 5.0–8.0)
PROTEIN: NEGATIVE mg/dL
Specific Gravity, Urine: 1.013 (ref 1.005–1.030)

## 2017-08-26 LAB — CBC
HCT: 32.9 % — ABNORMAL LOW (ref 35.0–47.0)
Hemoglobin: 10.9 g/dL — ABNORMAL LOW (ref 12.0–16.0)
MCH: 32 pg (ref 26.0–34.0)
MCHC: 33.3 g/dL (ref 32.0–36.0)
MCV: 96.2 fL (ref 80.0–100.0)
PLATELETS: 264 10*3/uL (ref 150–440)
RBC: 3.42 MIL/uL — AB (ref 3.80–5.20)
RDW: 12.8 % (ref 11.5–14.5)
WBC: 9.8 10*3/uL (ref 3.6–11.0)

## 2017-08-26 LAB — MAGNESIUM: Magnesium: 1.7 mg/dL (ref 1.7–2.4)

## 2017-08-26 LAB — PROCALCITONIN: Procalcitonin: <0.1 ng/mL

## 2017-08-26 MED ORDER — MAGNESIUM SULFATE 2 GM/50ML IV SOLN
2.0000 g | Freq: Once | INTRAVENOUS | Status: AC
  Administered 2017-08-26: 15:00:00 2 g via INTRAVENOUS
  Filled 2017-08-26: qty 50

## 2017-08-26 MED ORDER — SODIUM CHLORIDE 0.9 % IV BOLUS (SEPSIS)
1000.0000 mL | Freq: Once | INTRAVENOUS | Status: AC
  Administered 2017-08-26: 06:00:00 1000 mL via INTRAVENOUS

## 2017-08-26 MED ORDER — GUAIFENESIN-DM 100-10 MG/5ML PO SYRP
5.0000 mL | ORAL_SOLUTION | ORAL | Status: DC | PRN
  Filled 2017-08-26: qty 5

## 2017-08-26 MED ORDER — POTASSIUM CHLORIDE 10 MEQ/100ML IV SOLN
10.0000 meq | INTRAVENOUS | Status: AC
  Administered 2017-08-26 (×4): 10 meq via INTRAVENOUS
  Filled 2017-08-26 (×4): qty 100

## 2017-08-26 MED ORDER — POTASSIUM CHLORIDE CRYS ER 20 MEQ PO TBCR
40.0000 meq | EXTENDED_RELEASE_TABLET | ORAL | Status: AC
  Administered 2017-08-26: 40 meq via ORAL
  Filled 2017-08-26: qty 2

## 2017-08-26 MED ORDER — IPRATROPIUM-ALBUTEROL 0.5-2.5 (3) MG/3ML IN SOLN: 3.0000 mL | Freq: Four times a day (QID) | RESPIRATORY_TRACT | Status: DC | PRN

## 2017-08-26 MED ORDER — ORAL CARE MOUTH RINSE
15.0000 mL | Freq: Two times a day (BID) | OROMUCOSAL | Status: DC
  Administered 2017-08-27: 09:00:00 15 mL via OROMUCOSAL

## 2017-08-26 NOTE — Progress Notes (Addendum)
[place] for electrolytes Indication: hypokalemia  Allergies  Allergen Reactions  . Amlodipine Hives  . Azithromycin Hives  . Doxycycline Diarrhea  . Erythromycin Diarrhea  . Hydrocodone Nausea Only  . Minocycline Diarrhea  . Nsaids Other (See Comments)    Internal Bleeding  . Relafen [Nabumetone] Hives  . Sulfa Antibiotics Hives  . Tetracyclines & Related Diarrhea    Labs: Recent Labs    08/25/17 1141 08/26/17 0353  WBC 15.1* 9.8  HGB 13.3 10.9*  HCT 40.2 32.9*  PLT 329 264  CREATININE 0.78 0.93  MG  --  1.7   Estimated Creatinine Clearance: 38.5 mL/min (by C-G formula based on SCr of 0.93 mg/dL).  Sodium (mmol/L)  Date Value  08/26/2017 135  09/12/2012 132 (L)   Potassium (mmol/L)  Date Value  08/26/2017 2.9 (L)  09/11/2012 3.8   Magnesium (mg/dL)  Date Value  08/26/2017 1.7   Calcium (mg/dL)  Date Value  08/26/2017 8.3 (L)   Calcium, Total (mg/dL)  Date Value  09/11/2012 8.5    Assessment: 77 yo admitted with PNA and sepsis. Pharmacy has been consulted to manage and adjust electrolytes.   Goal of Therapy:  K = 3.5 - 5.1 Mg = 1.7 - 2.4  Plan:  K = 2.9, Mg = 1.7. Will replace with KCl 10 mEq x4 and potassium chloride 47mEq PO x2. Will also give magnesium 2g once. Will recheck K 6 hours after last run as well as follow up BMP and magnesium in the AM.   Lendon Ka, PharmD Pharmacy Resident 08/26/2017,4:06 PM     01/13 0000 K+ 4.8. No supplement indicated. Recheck with AM labs.  Sim Boast, PharmD, BCPS  08/27/17 3:48 AM

## 2017-08-26 NOTE — Progress Notes (Signed)
Patient blood pressure low. Spoke with MD. Patient to receive 1L fluid bolus and recheck blood pressure after bolus is completed.

## 2017-08-26 NOTE — Progress Notes (Addendum)
Patient refused bed alarm. Patient educated and verbalizes understanding

## 2017-08-26 NOTE — Progress Notes (Signed)
[facility] [place] NAME: Savannah Mcclure    MR#:  [id number]  DATE OF BIRTH:  29-May-1941  SUBJECTIVE:  CHIEF COMPLAINT:  Sob is better  REVIEW OF SYSTEMS:  CONSTITUTIONAL: No fever, fatigue or weakness.  EYES: No blurred or double vision.  EARS, NOSE, AND THROAT: No tinnitus or ear pain.  RESPIRATORY: improving cough, shortness of breath,  No wheezing or hemoptysis.  CARDIOVASCULAR: No chest pain, orthopnea, edema.  GASTROINTESTINAL: No nausea, vomiting, diarrhea or abdominal pain.  GENITOURINARY: No dysuria, hematuria.  ENDOCRINE: No polyuria, nocturia,  HEMATOLOGY: No anemia, easy bruising or bleeding SKIN: No rash or lesion. MUSCULOSKELETAL: No joint pain or arthritis.   NEUROLOGIC: No tingling, numbness, weakness.  PSYCHIATRY: No anxiety or depression.   DRUG ALLERGIES:   Allergies  Allergen Reactions  . Amlodipine Hives  . Azithromycin Hives  . Doxycycline Diarrhea  . Erythromycin Diarrhea  . Hydrocodone Nausea Only  . Minocycline Diarrhea  . Nsaids Other (See Comments)    Internal Bleeding  . Relafen [Nabumetone] Hives  . Sulfa Antibiotics Hives  . Tetracyclines & Related Diarrhea    VITALS:  Blood pressure (!) 153/57, pulse (!) 115, temperature 98.2 F (36.8 C), temperature source Oral, resp. rate 18, height _0  (1.6 m), weight 48.1 kg (106 lb), SpO2 99 %.  PHYSICAL EXAMINATION:  GENERAL:  77 y.o.-year-old patient lying in the bed with no acute distress.  EYES: Pupils equal, round, reactive to light and accommodation. No scleral icterus. Extraocular muscles intact.  HEENT: Head atraumatic, normocephalic. Oropharynx and nasopharynx clear.  NECK:  Supple, no jugular venous distention. No thyroid enlargement, no tenderness.  LUNGS: Mod breath sounds bilaterally, no wheezing, rales,rhonchi or crepitation. No use of accessory muscles of respiration.  CARDIOVASCULAR: S1, S2 normal. No murmurs, rubs, or gallops.   ABDOMEN: Soft, nontender, nondistended. Bowel sounds present. No organomegaly or mass.  EXTREMITIES: No pedal edema, cyanosis, or clubbing.  NEUROLOGIC: Cranial nerves II through XII are intact. Muscle strength 5/5 in all extremities. Sensation intact. Gait not checked.  PSYCHIATRIC: The patient is alert and oriented x 3.  SKIN: No obvious rash, lesion, or ulcer.    LABORATORY PANEL:   CBC Recent Labs  Lab 08/26/17 0353  WBC 9.8  HGB 10.9*  HCT 32.9*  PLT 264   ------------------------------------------------------------------------------------------------------------------  Chemistries  Recent Labs  Lab 08/26/17 0353  NA 135  K 2.9*  CL 105  CO2 21*  GLUCOSE 244*  BUN 18  CREATININE 0.93  CALCIUM 8.3*  MG 1.7   ------------------------------------------------------------------------------------------------------------------  Cardiac Enzymes Recent Labs  Lab 08/25/17 1141  TROPONINI <0.03   ------------------------------------------------------------------------------------------------------------------  RADIOLOGY:  Dg Chest 2 View  Result Date: 08/25/2017 CLINICAL DATA:  Shortness of breath.  Productive cough. EXAM: CHEST  2 VIEW COMPARISON:  Two-view chest x-ray 09/10/2012 and 09/19/2008. FINDINGS: Nodular densities in the right upper lobe are stable. The heart size is normal. Bilateral lower lobe airspace disease is new. Small effusions are present bilaterally. There is no edema. Severe levoconvex scoliosis at the thoracolumbar junction is again noted. IMPRESSION: 1. New bilateral lower lobe airspace disease and effusions compatible with lobar pneumonia. 2. Stable nodular densities in the right upper lobe. 3. Scoliosis. Electronically Signed   By: San Morelle M.D.   On: 08/25/2017 12:39    EKG:   Orders placed or performed during the hospital encounter of 08/25/17  . ED EKG  . ED EKG    ASSESSMENT AND PLAN:  1.  Clinical sepsis with  pneumonia,  Met septic criteria with leukocytosis, tachycardia and fever.   Since the patient has allergy to atypical medications  will give Levaquin.  The patient was given Rocephin in the emergency room.  Sputum culture if able.  2.  COPD exacerbation  Clinically better   Solu-Medrol 40 mg daily and budesonide and DuoNeb nebulizer solution  3.  Tachycardia which is chronic for this patient.  She does not want any rate controlling medications.  4.  Hypertension continue her usual benazepril  5.  Scoliosis and spinal stenosis and chronic back pain.  On oxycodone      All the records are reviewed and case discussed with Care Management/Social Workerr. Management plans discussed with the patient, family and they are in agreement.  CODE STATUS: fc   TOTAL TIME TAKING CARE OF THIS PATIENT: 36 minutes.   POSSIBLE D/C IN 1-2  DAYS, DEPENDING ON CLINICAL CONDITION.  Note: This dictation was prepared with Dragon dictation along with smaller phrase technology. Any transcriptional errors that result from this process are unintentional.   Nicholes Mango M.D on 08/26/2017 at 4:35 PM  Between 7am to 6pm - Pager - [phone] After 6pm go to [email or web address] - password [email or web address] [facility]  Office  [phone]  CC: Primary care physician; Patient, No Pcp Per

## 2017-08-27 LAB — BASIC METABOLIC PANEL
ANION GAP: 6 (ref 5–15)
BUN: 15 mg/dL (ref 6–20)
CHLORIDE: 106 mmol/L (ref 101–111)
CO2: 24 mmol/L (ref 22–32)
Calcium: 8.5 mg/dL — ABNORMAL LOW (ref 8.9–10.3)
Creatinine, Ser: 0.82 mg/dL (ref 0.44–1.00)
GFR calc Af Amer: >60 mL/min (ref >60)
GFR calc non Af Amer: >60 mL/min (ref >60)
Glucose, Bld: 193 mg/dL — ABNORMAL HIGH (ref 65–99)
POTASSIUM: 4.8 mmol/L (ref 3.5–5.1)
Sodium: 136 mmol/L (ref 135–145)

## 2017-08-27 LAB — POTASSIUM: POTASSIUM: 4.8 mmol/L (ref 3.5–5.1)

## 2017-08-27 LAB — PROCALCITONIN: Procalcitonin: <0.1 ng/mL

## 2017-08-27 LAB — MAGNESIUM: Magnesium: 2.2 mg/dL (ref 1.7–2.4)

## 2017-08-27 MED ORDER — LEVOFLOXACIN 500 MG PO TABS: 500.0000 mg | ORAL_TABLET | Freq: Every day | ORAL | 0 refills | Status: AC

## 2017-08-27 MED ORDER — FLUTICASONE-SALMETEROL 250-50 MCG/DOSE IN AEPB: 1.0000 | INHALATION_SPRAY | Freq: Two times a day (BID) | RESPIRATORY_TRACT | 0 refills | Status: DC

## 2017-08-27 MED ORDER — ALBUTEROL SULFATE HFA 108 (90 BASE) MCG/ACT IN AERS: 2.0000 | INHALATION_SPRAY | Freq: Four times a day (QID) | RESPIRATORY_TRACT | 2 refills | Status: DC | PRN

## 2017-08-27 MED ORDER — TIOTROPIUM BROMIDE MONOHYDRATE 18 MCG IN CAPS: 18.0000 ug | ORAL_CAPSULE | Freq: Every day | RESPIRATORY_TRACT | 2 refills | Status: DC

## 2017-08-27 MED ORDER — GUAIFENESIN-DM 100-10 MG/5ML PO SYRP: 10.0000 mL | ORAL_SOLUTION | Freq: Four times a day (QID) | ORAL | 0 refills | Status: DC | PRN

## 2017-08-27 MED ORDER — PREDNISONE 10 MG (21) PO TBPK: 10.0000 mg | ORAL_TABLET | Freq: Every day | ORAL | 0 refills | Status: DC

## 2017-08-27 NOTE — Progress Notes (Signed)
[place] for electrolytes Indication: hypokalemia  Allergies  Allergen Reactions  . Amlodipine Hives  . Azithromycin Hives  . Doxycycline Diarrhea  . Erythromycin Diarrhea  . Hydrocodone Nausea Only  . Minocycline Diarrhea  . Nsaids Other (See Comments)    Internal Bleeding  . Relafen [Nabumetone] Hives  . Sulfa Antibiotics Hives  . Tetracyclines & Related Diarrhea    Labs: Recent Labs    08/25/17 1141 08/26/17 0353 08/27/17 0458  WBC 15.1* 9.8  --   HGB 13.3 10.9*  --   HCT 40.2 32.9*  --   PLT 329 264  --   CREATININE 0.78 0.93 0.82  MG  --  1.7 2.2   Estimated Creatinine Clearance: 43.6 mL/min (by C-G formula based on SCr of 0.82 mg/dL).  Sodium (mmol/L)  Date Value  08/27/2017 136  09/12/2012 132 (L)   Potassium (mmol/L)  Date Value  08/27/2017 4.8  09/11/2012 3.8   Magnesium (mg/dL)  Date Value  08/27/2017 2.2   Calcium (mg/dL)  Date Value  08/27/2017 8.5 (L)   Calcium, Total (mg/dL)  Date Value  09/11/2012 8.5    Assessment: 77 yo admitted with PNA and sepsis. Pharmacy has been consulted to manage and adjust electrolytes.   Goal of Therapy:  K = 3.5 - 5.1 Mg = 1.7 - 2.4  Plan:  Electrolytes remain WNL. Will f/u AM labs.   Ulice Dash, PharmD Clinical Pharmacist  08/27/17 11:16 AM

## 2017-08-27 NOTE — Progress Notes (Signed)
Dr. Marcille Blanco paged r/t BP of 124/49. No new orders per MD. Continue to monitor

## 2017-08-27 NOTE — Care Management Note (Signed)
Case Management Note  Patient Details  Name: Savannah Mcclure MRN: [id number] Date of Birth: Nov 11, 1940  Subjective/Objective:      Savannah Mcclure' nurse will enter her 02 saturation level measurements as soon as she can. If Savannah Mcclure meets 30 requirements the CM will order new oxygen from [employer].              Action/Plan:   Expected Discharge Date:  08/27/17               Expected Discharge Plan:     In-House Referral:     Discharge planning Services     Post Acute Care Choice:    Choice offered to:     DME Arranged:    DME Agency:     HH Arranged:    HH Agency:     Status of Service:     If discussed at [employer], dates discussed:    Additional Comments:  Karnell Vanderloop A, RN 08/27/2017, 1:32 PM

## 2017-08-27 NOTE — Progress Notes (Signed)
[facility] discharged pt via wheelchair to the visitor's entrance

## 2017-08-27 NOTE — Discharge Summary (Signed)
Savannah Mcclure NAME: [name]    MR#:  [id number]  DATE OF BIRTH:  [birth year]  DATE OF ADMISSION:  08/25/2017 ADMITTING PHYSICIAN: [name], MD  DATE OF DISCHARGE:  08/27/17  PRIMARY CARE PHYSICIAN: Patient, No Pcp Per    ADMISSION DIAGNOSIS:  COPD exacerbation ([place]) [J44.1] Sepsis, due to unspecified organism ([place]) [A41.9] Community acquired pneumonia, unspecified laterality [J18.9]  DISCHARGE DIAGNOSIS:  Active Problems:   Sepsis ([place]) Acute COPD Community-acquired pneumonia  SECONDARY DIAGNOSIS:   Past Medical History:  Diagnosis Date  . COPD (chronic obstructive pulmonary disease) ([place])   . Hypertension   . Scoliosis   . Spinal stenosis     HOSPITAL COURSE:   HPI Savannah Mcclure  is a 77 y.o. female with a known history of COPD and hypertension.  She presents with feeling horrible since she came here.  She is weak.  She is coughing up green phlegm.  She always has a history of sinus issues.  She has upper back pain.  She is short of breath with limited activity.  In the ER, she was found to be tachycardic, fever, leukocytosis and chest x-ray showing pneumonia.  Hospitalist services were contacted for further evaluation.  1. Clinical sepsis with pneumonia,  Met septic criteria with leukocytosis, tachycardia and fever.  Since the patient has allergy to atypical medications  will give Levaquin. The patient was given Rocephin in the emergency room. Sputum culture if able. Blood cultures are negative Will discharge patient with p.o. levofloxacin as clinically doing much better   2. COPD exacerbation  with chronic hypoxic respiratory failure Continue 2 L of oxygen nightly/as needed Clinically better   Solu-Medrol 40 mg daily will be tapered to p.o. prednisone and continue budesonide and DuoNeb nebulizer solution  3. Tachycardia which is chronic for this patient. She does not want any rate  controlling medications. Clinically improved  4. Hypertension continue her usual benazepril  5. Scoliosis and spinal stenosis and chronic back pain. On oxycodone    DISCHARGE CONDITIONS:   Stable   CONSULTS OBTAINED:     PROCEDURES  None   DRUG ALLERGIES:   Allergies  Allergen Reactions  . Amlodipine Hives  . Azithromycin Hives  . Doxycycline Diarrhea  . Erythromycin Diarrhea  . Hydrocodone Nausea Only  . Minocycline Diarrhea  . Nsaids Other (See Comments)    Internal Bleeding  . Relafen [Nabumetone] Hives  . Sulfa Antibiotics Hives  . Tetracyclines & Related Diarrhea    DISCHARGE MEDICATIONS:   Allergies as of 08/27/2017      Reactions   Amlodipine Hives   Azithromycin Hives   Doxycycline Diarrhea   Erythromycin Diarrhea   Hydrocodone Nausea Only   Minocycline Diarrhea   Nsaids Other (See Comments)   Internal Bleeding   Relafen [nabumetone] Hives   Sulfa Antibiotics Hives   Tetracyclines & Related Diarrhea      Medication List    TAKE these medications   albuterol 108 (90 Base) MCG/ACT inhaler Commonly known as:  PROVENTIL HFA;VENTOLIN HFA Inhale 2 puffs into the lungs every 6 (six) hours as needed for wheezing or shortness of breath.   B-COMPLEX-C PO Take 1 tablet by mouth daily.   benazepril 40 MG tablet Commonly known as:  LOTENSIN Take 40 mg by mouth daily.   cilostazol 100 MG tablet Commonly known as:  PLETAL Take 100 mg by mouth 2 (two) times daily.   clonazePAM 0.5 MG tablet Commonly known  as:  KLONOPIN Take 0.5 mg by mouth 2 (two) times daily as needed for anxiety.   Fluticasone-Salmeterol 250-50 MCG/DOSE Aepb Commonly known as:  ADVAIR DISKUS Inhale 1 puff into the lungs 2 (two) times daily.   gabapentin 100 MG capsule Commonly known as:  NEURONTIN Take 100 mg by mouth 2 (two) times daily.   guaiFENesin-dextromethorphan 100-10 MG/5ML syrup Commonly known as:  ROBITUSSIN DM Take 10 mLs by mouth every 6 (six) hours as  needed for cough (chest congestion).   levofloxacin 500 MG tablet Commonly known as:  LEVAQUIN Take 1 tablet (500 mg total) by mouth daily for 10 days.   lidocaine 5 % Commonly known as:  LIDODERM Place 1 patch onto the skin daily. Remove & Discard patch within 12 hours or as directed by MD   multivitamin with minerals tablet Take 1 tablet by mouth daily.   niacinamide 100 MG tablet Take 100 mg by mouth 2 (two) times daily with a meal.   oxyCODONE 5 MG immediate release tablet Commonly known as:  Oxy IR/ROXICODONE Take 5 mg by mouth every 4 (four) hours as needed for severe pain.   predniSONE 10 MG (21) Tbpk tablet Commonly known as:  STERAPRED UNI-PAK 21 TAB Take 1 tablet (10 mg total) by mouth daily. Take 6 tablets by mouth for 1 day followed by  5 tablets by mouth for 1 day followed by  4 tablets by mouth for 1 day followed by  3 tablets by mouth for 1 day followed by  2 tablets by mouth for 1 day followed by  1 tablet by mouth for a day and stop   pseudoephedrine 30 MG tablet Commonly known as:  SUDAFED Take 30 mg by mouth every 4 (four) hours as needed for congestion.   tiotropium 18 MCG inhalation capsule Commonly known as:  SPIRIVA HANDIHALER Place 1 capsule (18 mcg total) into inhaler and inhale daily.            Durable Medical Equipment  (From admission, onward)        Start     Ordered   08/27/17 1302  For home use only DME oxygen  Once    Question Answer Comment  Mode or (Route) Nasal cannula   Liters per Minute 2   Frequency Continuous (stationary and portable oxygen unit needed)   Oxygen conserving device Yes   Oxygen delivery system Gas      08/27/17 1302       DISCHARGE INSTRUCTIONS:   Follow-up with primary care physician in 1 week Continue oxygen via nasal cannula nightly and as needed   DIET:  LOW SALT  DISCHARGE CONDITION:  Stable  ACTIVITY:  Activity as tolerated  OXYGEN:  Home Oxygen: Yes.     Oxygen Delivery: 2  liters/min via Patient connected to nasal cannula oxygen  DISCHARGE LOCATION:  home   If you experience worsening of your admission symptoms, develop shortness of breath, life threatening emergency, suicidal or homicidal thoughts you must seek medical attention immediately by calling 911 or calling your MD immediately  if symptoms less severe.  You Must read complete instructions/literature along with all the possible adverse reactions/side effects for all the Medicines you take and that have been prescribed to you. Take any new Medicines after you have completely understood and accpet all the possible adverse reactions/side effects.   Please note  You were cared for by a hospitalist during your hospital stay. If you have any questions about your discharge medications  or the care you received while you were in the hospital after you are discharged, you can call the unit and asked to speak with the hospitalist on call if the hospitalist that took care of you is not available. Once you are discharged, your primary care physician will handle any further medical issues. Please note that NO REFILLS for any discharge medications will be authorized once you are discharged, as it is imperative that you return to your primary care physician (or establish a relationship with a primary care physician if you do not have one) for your aftercare needs so that they can reassess your need for medications and monitor your lab values.     Today  Chief Complaint  Patient presents with  . Cough  . Shortness of Breath   Patient shortness of breath and cough significantly improved.  Feeling much better and wants to go home  ROS:  CONSTITUTIONAL: Denies fevers, chills. Denies any fatigue, weakness.  EYES: Denies blurry vision, double vision, eye pain. EARS, NOSE, THROAT: Denies tinnitus, ear pain, hearing loss. RESPIRATORY: Denies cough, wheeze, shortness of breath.  CARDIOVASCULAR: Denies chest pain,  palpitations, edema.  GASTROINTESTINAL: Denies nausea, vomiting, diarrhea, abdominal pain. Denies bright red blood per rectum. GENITOURINARY: Denies dysuria, hematuria. ENDOCRINE: Denies nocturia or thyroid problems. HEMATOLOGIC AND LYMPHATIC: Denies easy bruising or bleeding. SKIN: Denies rash or lesion. MUSCULOSKELETAL: Denies pain in neck, back, shoulder, knees, hips or arthritic symptoms.  NEUROLOGIC: Denies paralysis, paresthesias.  PSYCHIATRIC: Denies anxiety or depressive symptoms.   VITAL SIGNS:  Blood pressure 112/60, pulse 99, temperature (!) 97.4 F (36.3 C), temperature source Oral, resp. rate 18, height _0  (1.6 m), weight 48.1 kg (106 lb), SpO2 100 %.  I/O:    Intake/Output Summary (Last 24 hours) at 08/27/2017 1313 Last data filed at 08/27/2017 1155 Gross per 24 hour  Intake 611.07 ml  Output -  Net 611.07 ml    PHYSICAL EXAMINATION:  GENERAL:  77 y.o.-year-old patient lying in the bed with no acute distress.  EYES: Pupils equal, round, reactive to light and accommodation. No scleral icterus. Extraocular muscles intact.  HEENT: Head atraumatic, normocephalic. Oropharynx and nasopharynx clear.  NECK:  Supple, no jugular venous distention. No thyroid enlargement, no tenderness.  LUNGS: Normal breath sounds bilaterally, no wheezing, rales,rhonchi or crepitation. No use of accessory muscles of respiration.  CARDIOVASCULAR: S1, S2 normal. No murmurs, rubs, or gallops.  ABDOMEN: Soft, non-tender, non-distended. Bowel sounds present. No organomegaly or mass.  EXTREMITIES: No pedal edema, cyanosis, or clubbing.  NEUROLOGIC: Cranial nerves II through XII are intact. Muscle strength 5/5 in all extremities. Sensation intact. Gait not checked.  PSYCHIATRIC: The patient is alert and oriented x 3.  SKIN: No obvious rash, lesion, or ulcer.   DATA REVIEW:   CBC Recent Labs  Lab 08/26/17 0353  WBC 9.8  HGB 10.9*  HCT 32.9*  PLT 264    Chemistries  Recent Labs  Lab  08/27/17 0458  NA 136  K 4.8  CL 106  CO2 24  GLUCOSE 193*  BUN 15  CREATININE 0.82  CALCIUM 8.5*  MG 2.2    Cardiac Enzymes Recent Labs  Lab 08/25/17 1141  [place] <0.03    Microbiology Results  Results for orders placed or performed during the hospital encounter of 08/25/17  Blood Culture (routine x 2)     Status: None (Preliminary result)   Collection Time: 08/25/17  2:08 PM  Result Value Ref Range Status   Specimen Description BLOOD  RIGHT ARM  Final   Special Requests Blood Culture adequate volume  Final   Culture   Final    NO GROWTH 2 DAYS Performed at [facility], [address] [place], [place] [address]    Report Status PENDING  Incomplete  Blood Culture (routine x 2)     Status: None (Preliminary result)   Collection Time: 08/25/17  2:11 PM  Result Value Ref Range Status   Specimen Description BLOOD LEFT ARM  Final   Special Requests Blood Culture adequate volume  Final   Culture   Final    NO GROWTH 2 DAYS Performed at [facility], [address], [place], [place] [address]    Report Status PENDING  Incomplete    RADIOLOGY:  Dg Chest 2 View  Result Date: 08/25/2017 CLINICAL DATA:  Shortness of breath.  Productive cough. EXAM: CHEST  2 VIEW COMPARISON:  Two-view chest x-ray 09/10/2012 and 09/19/2008. FINDINGS: Nodular densities in the right upper lobe are stable. The heart size is normal. Bilateral lower lobe airspace disease is new. Small effusions are present bilaterally. There is no edema. Severe levoconvex scoliosis at the thoracolumbar junction is again noted. IMPRESSION: 1. New bilateral lower lobe airspace disease and effusions compatible with lobar pneumonia. 2. Stable nodular densities in the right upper lobe. 3. Scoliosis. Electronically Signed   By: San Morelle M.D.   On: 08/25/2017 12:39    EKG:   Orders placed or performed during the hospital encounter of 08/25/17  . ED EKG  . ED EKG      Management  plans discussed with the patient, family and they are in agreement.  CODE STATUS:     Code Status Orders  (From admission, onward)        Start     Ordered   08/25/17 1500  Full code  Continuous     08/25/17 1459    Code Status History    Date Active Date Inactive Code Status Order ID Comments User Context   This patient has a current code status but no historical code status.    Advance Directive Documentation     Most Recent Value  Type of Advance Directive  Living will  Pre-existing out of facility DNR order (yellow form or pink MOST form)  No data  "MOST" Form in Place?  No data      TOTAL TIME TAKING CARE OF THIS PATIENT: 43  minutes.   Note: This dictation was prepared with Dragon dictation along with smaller phrase technology. Any transcriptional errors that result from this process are unintentional.   _0 @  on 08/27/2017 at 1:13 PM  Between 7am to 6pm - Pager - [phone]  After 6pm go to [email or web address] - password [email or web address] [facility]  Office  [phone]  CC: Primary care physician; Patient, No Pcp Per

## 2017-08-27 NOTE — Progress Notes (Signed)
MD order received in [facility] to discharge pt home today; verbally reviewed AVS with pt, gave Rxs to pt; no questions voiced at this time; auxillary called for pt discharge; discharge pending arrival of auxillary with a wheelchair

## 2017-08-27 NOTE — Discharge Instructions (Signed)
Follow-up with primary care physician in 1 week Continue oxygen via nasal cannula nightly and as needed

## 2017-08-27 NOTE — Progress Notes (Signed)
SATURATION QUALIFICATIONS: (This note is used to comply with regulatory documentation for home oxygen)  Patient Saturations on Room Air at Rest = 97%  Patient Saturations on Room Air while Ambulating = 95%  Patient Saturations on  Liters of oxygen while Ambulating = %  Please briefly explain why patient needs home oxygen: 

## 2017-08-30 LAB — CULTURE, BLOOD (ROUTINE X 2)
CULTURE: NO GROWTH
CULTURE: NO GROWTH
Special Requests: ADEQUATE
Special Requests: ADEQUATE

## 2017-10-03 ENCOUNTER — Ambulatory Visit: Payer: TRICARE For Life (TFL) | Admitting: Family Medicine

## 2017-10-07 ENCOUNTER — Other Ambulatory Visit: Payer: Self-pay

## 2017-10-07 ENCOUNTER — Encounter: Payer: Self-pay | Admitting: Emergency Medicine

## 2017-10-07 ENCOUNTER — Emergency Department: Payer: Medicare Other

## 2017-10-07 ENCOUNTER — Inpatient Hospital Stay
Admission: EM | Admit: 2017-10-07 | Discharge: 2017-10-13 | DRG: 871 | Disposition: A | Discharge status: Home Health | Payer: Medicare Other | Attending: Specialist | Admitting: Specialist

## 2017-10-07 DIAGNOSIS — M4800 Spinal stenosis, site unspecified: Secondary | ICD-10-CM | POA: Diagnosis present

## 2017-10-07 DIAGNOSIS — Z8674 Personal history of sudden cardiac arrest: Secondary | ICD-10-CM | POA: Diagnosis not present

## 2017-10-07 DIAGNOSIS — J9610 Chronic respiratory failure, unspecified whether with hypoxia or hypercapnia: Secondary | ICD-10-CM | POA: Diagnosis present

## 2017-10-07 DIAGNOSIS — Z881 Allergy status to other antibiotic agents status: Secondary | ICD-10-CM | POA: Diagnosis not present

## 2017-10-07 DIAGNOSIS — G8929 Other chronic pain: Secondary | ICD-10-CM | POA: Diagnosis present

## 2017-10-07 DIAGNOSIS — R6521 Severe sepsis with septic shock: Secondary | ICD-10-CM | POA: Diagnosis present

## 2017-10-07 DIAGNOSIS — R402413 Glasgow coma scale score 13-15, at hospital admission: Secondary | ICD-10-CM | POA: Diagnosis present

## 2017-10-07 DIAGNOSIS — R000 Tachycardia, unspecified: Secondary | ICD-10-CM | POA: Diagnosis present

## 2017-10-07 DIAGNOSIS — E876 Hypokalemia: Secondary | ICD-10-CM | POA: Diagnosis present

## 2017-10-07 DIAGNOSIS — Z79899 Other long term (current) drug therapy: Secondary | ICD-10-CM | POA: Diagnosis not present

## 2017-10-07 DIAGNOSIS — Y95 Nosocomial condition: Secondary | ICD-10-CM | POA: Diagnosis present

## 2017-10-07 DIAGNOSIS — I10 Essential (primary) hypertension: Secondary | ICD-10-CM | POA: Diagnosis present

## 2017-10-07 DIAGNOSIS — R6520 Severe sepsis without septic shock: Secondary | ICD-10-CM | POA: Diagnosis not present

## 2017-10-07 DIAGNOSIS — J449 Chronic obstructive pulmonary disease, unspecified: Secondary | ICD-10-CM | POA: Diagnosis present

## 2017-10-07 DIAGNOSIS — F419 Anxiety disorder, unspecified: Secondary | ICD-10-CM | POA: Diagnosis present

## 2017-10-07 DIAGNOSIS — Z7951 Long term (current) use of inhaled steroids: Secondary | ICD-10-CM

## 2017-10-07 DIAGNOSIS — R0603 Acute respiratory distress: Secondary | ICD-10-CM | POA: Diagnosis not present

## 2017-10-07 DIAGNOSIS — E739 Lactose intolerance, unspecified: Secondary | ICD-10-CM | POA: Diagnosis present

## 2017-10-07 DIAGNOSIS — Z888 Allergy status to other drugs, medicaments and biological substances status: Secondary | ICD-10-CM | POA: Diagnosis not present

## 2017-10-07 DIAGNOSIS — G629 Polyneuropathy, unspecified: Secondary | ICD-10-CM | POA: Diagnosis present

## 2017-10-07 DIAGNOSIS — F17210 Nicotine dependence, cigarettes, uncomplicated: Secondary | ICD-10-CM | POA: Diagnosis present

## 2017-10-07 DIAGNOSIS — Z885 Allergy status to narcotic agent status: Secondary | ICD-10-CM

## 2017-10-07 DIAGNOSIS — A419 Sepsis, unspecified organism: Secondary | ICD-10-CM | POA: Diagnosis not present

## 2017-10-07 DIAGNOSIS — M419 Scoliosis, unspecified: Secondary | ICD-10-CM | POA: Diagnosis present

## 2017-10-07 DIAGNOSIS — Z882 Allergy status to sulfonamides status: Secondary | ICD-10-CM

## 2017-10-07 DIAGNOSIS — J440 Chronic obstructive pulmonary disease with acute lower respiratory infection: Secondary | ICD-10-CM | POA: Diagnosis present

## 2017-10-07 DIAGNOSIS — Z886 Allergy status to analgesic agent status: Secondary | ICD-10-CM

## 2017-10-07 DIAGNOSIS — J441 Chronic obstructive pulmonary disease with (acute) exacerbation: Secondary | ICD-10-CM | POA: Diagnosis not present

## 2017-10-07 DIAGNOSIS — M549 Dorsalgia, unspecified: Secondary | ICD-10-CM | POA: Diagnosis present

## 2017-10-07 DIAGNOSIS — I959 Hypotension, unspecified: Secondary | ICD-10-CM | POA: Diagnosis not present

## 2017-10-07 DIAGNOSIS — R0602 Shortness of breath: Secondary | ICD-10-CM | POA: Diagnosis not present

## 2017-10-07 DIAGNOSIS — J189 Pneumonia, unspecified organism: Secondary | ICD-10-CM | POA: Diagnosis not present

## 2017-10-07 DIAGNOSIS — R9431 Abnormal electrocardiogram [ECG] [EKG]: Secondary | ICD-10-CM | POA: Diagnosis not present

## 2017-10-07 HISTORY — DX: Cardiac arrest, cause unspecified: I46.9

## 2017-10-07 HISTORY — DX: Sepsis, unspecified organism: A41.9

## 2017-10-07 LAB — COMPREHENSIVE METABOLIC PANEL
ALBUMIN: 4.4 g/dL (ref 3.5–5.0)
ALT: 14 U/L (ref 14–54)
ANION GAP: 12 (ref 5–15)
AST: 21 U/L (ref 15–41)
Alkaline Phosphatase: 67 U/L (ref 38–126)
BUN: 18 mg/dL (ref 6–20)
CHLORIDE: 99 mmol/L — AB (ref 101–111)
CO2: 24 mmol/L (ref 22–32)
CREATININE: 0.95 mg/dL (ref 0.44–1.00)
Calcium: 9.2 mg/dL (ref 8.9–10.3)
GFR calc non Af Amer: 56 mL/min — ABNORMAL LOW (ref >60)
GLUCOSE: 133 mg/dL — AB (ref 65–99)
Potassium: 3.3 mmol/L — ABNORMAL LOW (ref 3.5–5.1)
SODIUM: 135 mmol/L (ref 135–145)
Total Bilirubin: 1.5 mg/dL — ABNORMAL HIGH (ref 0.3–1.2)
Total Protein: 8.2 g/dL — ABNORMAL HIGH (ref 6.5–8.1)

## 2017-10-07 LAB — CBC WITH DIFFERENTIAL/PLATELET
BASOS ABS: 0 10*3/uL (ref 0–0.1)
Basophils Relative: 0 %
EOS ABS: 0 10*3/uL (ref 0–0.7)
EOS PCT: 0 %
HCT: 41.9 % (ref 35.0–47.0)
Hemoglobin: 13.9 g/dL (ref 12.0–16.0)
Lymphocytes Relative: 8 %
Lymphs Abs: 1.4 10*3/uL (ref 1.0–3.6)
MCH: 31.2 pg (ref 26.0–34.0)
MCHC: 33.2 g/dL (ref 32.0–36.0)
MCV: 94 fL (ref 80.0–100.0)
Monocytes Absolute: 1.1 10*3/uL — ABNORMAL HIGH (ref 0.2–0.9)
Monocytes Relative: 6 %
Neutro Abs: 14.6 10*3/uL — ABNORMAL HIGH (ref 1.4–6.5)
Neutrophils Relative %: 86 %
PLATELETS: 345 10*3/uL (ref 150–440)
RBC: 4.46 MIL/uL (ref 3.80–5.20)
RDW: 13.1 % (ref 11.5–14.5)
WBC: 17.1 10*3/uL — AB (ref 3.6–11.0)

## 2017-10-07 LAB — PROTIME-INR
INR: 1.07
PROTHROMBIN TIME: 13.8 s (ref 11.4–15.2)

## 2017-10-07 LAB — LACTIC ACID, PLASMA: Lactic Acid, Venous: 1.1 mmol/L (ref 0.5–1.9)

## 2017-10-07 LAB — INFLUENZA PANEL BY PCR (TYPE A & B)
INFLAPCR: NEGATIVE
INFLBPCR: NEGATIVE

## 2017-10-07 MED ORDER — SODIUM CHLORIDE 0.9 % IV BOLUS (SEPSIS)
1000.0000 mL | Freq: Once | INTRAVENOUS | Status: AC
  Administered 2017-10-07: 1000 mL via INTRAVENOUS

## 2017-10-07 MED ORDER — IPRATROPIUM-ALBUTEROL 0.5-2.5 (3) MG/3ML IN SOLN
3.0000 mL | Freq: Once | RESPIRATORY_TRACT | Status: AC
  Administered 2017-10-07: 3 mL via RESPIRATORY_TRACT
  Filled 2017-10-07: qty 6

## 2017-10-07 MED ORDER — VANCOMYCIN HCL IN DEXTROSE 750-5 MG/150ML-% IV SOLN: 750.0000 mg | INTRAVENOUS | Status: DC

## 2017-10-07 MED ORDER — VANCOMYCIN HCL IN DEXTROSE 1-5 GM/200ML-% IV SOLN
1000.0000 mg | Freq: Once | INTRAVENOUS | Status: AC
  Administered 2017-10-07: 1000 mg via INTRAVENOUS

## 2017-10-07 MED ORDER — ACETAMINOPHEN 500 MG PO TABS
1000.0000 mg | ORAL_TABLET | Freq: Once | ORAL | Status: AC
  Administered 2017-10-07: 1000 mg via ORAL
  Filled 2017-10-07: qty 2

## 2017-10-07 MED ORDER — VANCOMYCIN HCL IN DEXTROSE 1-5 GM/200ML-% IV SOLN
INTRAVENOUS | Status: AC
  Filled 2017-10-07: qty 200

## 2017-10-07 MED ORDER — PIPERACILLIN-TAZOBACTAM 3.375 G IVPB
INTRAVENOUS | Status: AC
  Filled 2017-10-07: qty 50

## 2017-10-07 MED ORDER — SODIUM CHLORIDE 0.9 % IV BOLUS (SEPSIS)
500.0000 mL | Freq: Once | INTRAVENOUS | Status: AC
  Administered 2017-10-07: 500 mL via INTRAVENOUS

## 2017-10-07 MED ORDER — PIPERACILLIN-TAZOBACTAM 3.375 G IVPB
3.3750 g | Freq: Three times a day (TID) | INTRAVENOUS | Status: DC
  Administered 2017-10-08 – 2017-10-09 (×4): 3.375 g via INTRAVENOUS
  Filled 2017-10-07 (×4): qty 50

## 2017-10-07 MED ORDER — PIPERACILLIN-TAZOBACTAM 3.375 G IVPB 30 MIN
3.3750 g | Freq: Once | INTRAVENOUS | Status: AC
  Administered 2017-10-07: 3.375 g via INTRAVENOUS

## 2017-10-07 MED ORDER — IPRATROPIUM-ALBUTEROL 0.5-2.5 (3) MG/3ML IN SOLN
3.0000 mL | Freq: Once | RESPIRATORY_TRACT | Status: AC
  Administered 2017-10-07: 3 mL via RESPIRATORY_TRACT

## 2017-10-07 NOTE — H&P (Signed)
[place] [facility] [place]    MR#:  [id number]  DATE OF BIRTH:  12/03/1940  DATE OF ADMISSION:  10/07/2017  PRIMARY CARE PHYSICIAN: Patient, No Pcp Per   REQUESTING/REFERRING PHYSICIAN: Siadecki, MD  CHIEF COMPLAINT:   Chief Complaint  Patient presents with  . Blood Infection    HISTORY OF PRESENT ILLNESS:  Savannah Mcclure  is a 77 y.o. female who presents with shortness of breath, fatigue.  Patient was here in the hospital recently and treated for pneumonia.  She states that she feels like she may have left too soon as she never felt that she got completely better at home.  She started feeling much worse again over the past few days.  Tonight she is found to have pneumonia on chest x-ray.  Hospitalist called for admission.  PAST MEDICAL HISTORY:   Past Medical History:  Diagnosis Date  . Cardiac arrest ([place])   . COPD (chronic obstructive pulmonary disease) ([place])   . Hypertension   . Scoliosis   . Sepsis ([place])   . Spinal stenosis     PAST SURGICAL HISTORY:   Past Surgical History:  Procedure Laterality Date  . ABDOMINAL HYSTERECTOMY    . BACK SURGERY    . right eye surgery    . WISDOM TOOTH EXTRACTION      SOCIAL HISTORY:   Social History   Tobacco Use  . Smoking status: Current Every Day Smoker    Packs/day: 0.50  . Smokeless tobacco: Never Used  Substance Use Topics  . Alcohol use: No    Frequency: Never    FAMILY HISTORY:   Family History  Problem Relation Age of Onset  . Intracerebral hemorrhage Mother   . Hypertension Mother   . Heart failure Father     DRUG ALLERGIES:   Allergies  Allergen Reactions  . Amlodipine Hives  . Azithromycin Hives  . Doxycycline Diarrhea  . Erythromycin Diarrhea  . Hydrocodone Nausea Only  . Minocycline Diarrhea  . Nsaids Other (See Comments)    Internal Bleeding  . Relafen [Nabumetone] Hives  . Sulfa Antibiotics Hives  .  Tetracyclines & Related Diarrhea    MEDICATIONS AT HOME:   Prior to Admission medications   Medication Sig Start Date End Date Taking? Authorizing Provider  B-COMPLEX-C PO Take 1 tablet by mouth daily.   Yes [provider]  benazepril (LOTENSIN) 40 MG tablet Take 40 mg by mouth daily.   Yes [provider]  cilostazol (PLETAL) 100 MG tablet Take 100 mg by mouth 2 (two) times daily.   Yes [provider]  clonazePAM (KLONOPIN) 0.5 MG tablet Take 0.5 mg by mouth at bedtime.    Yes [provider]  Fluticasone-Salmeterol (ADVAIR DISKUS) 250-50 MCG/DOSE AEPB Inhale 1 puff into the lungs 2 (two) times daily. 08/27/17 08/27/18 Yes Gouru, Illene Silver, MD  gabapentin (NEURONTIN) 100 MG capsule Take 100 mg by mouth 2 (two) times daily.   Yes [provider]  lidocaine (LIDODERM) 5 % Place 1 patch onto the skin daily. Remove & Discard patch within 12 hours or as directed by MD   Yes [provider]  Multiple Vitamins-Minerals (MULTIVITAMIN WITH MINERALS) tablet Take 1 tablet by mouth daily.   Yes [provider]  niacinamide 100 MG tablet Take 100 mg by mouth 2 (two) times daily with a meal.   Yes [provider]  oxyCODONE (OXY IR/ROXICODONE) 5 MG immediate release tablet Take  5 mg by mouth 2 (two) times daily as needed for severe pain.    Yes [provider]  tiotropium (SPIRIVA HANDIHALER) 18 MCG inhalation capsule Place 1 capsule (18 mcg total) into inhaler and inhale daily. 08/27/17 08/27/18 Yes Gouru, Aruna, MD  albuterol (PROVENTIL HFA;VENTOLIN HFA) 108 (90 Base) MCG/ACT inhaler Inhale 2 puffs into the lungs every 6 (six) hours as needed for wheezing or shortness of breath. Patient not taking: Reported on 10/07/2017 08/27/17   Nicholes Mango, MD  guaiFENesin-dextromethorphan (ROBITUSSIN DM) 100-10 MG/5ML syrup Take 10 mLs by mouth every 6 (six) hours as needed for cough (chest congestion). Patient not taking: Reported on 10/07/2017  08/27/17   Nicholes Mango, MD  predniSONE (STERAPRED UNI-PAK 21 TAB) 10 MG (21) TBPK tablet Take 1 tablet (10 mg total) by mouth daily. Take 6 tablets by mouth for 1 day followed by  5 tablets by mouth for 1 day followed by  4 tablets by mouth for 1 day followed by  3 tablets by mouth for 1 day followed by  2 tablets by mouth for 1 day followed by  1 tablet by mouth for a day and stop Patient not taking: Reported on 10/07/2017 08/27/17   Nicholes Mango, MD    REVIEW OF SYSTEMS:  Review of Systems  Constitutional: Positive for malaise/fatigue. Negative for chills, fever and weight loss.  HENT: Negative for ear pain, hearing loss and tinnitus.   Eyes: Negative for blurred vision, double vision, pain and redness.  Respiratory: Positive for cough and shortness of breath. Negative for hemoptysis.   Cardiovascular: Negative for chest pain, palpitations, orthopnea and leg swelling.  Gastrointestinal: Negative for abdominal pain, constipation, diarrhea, nausea and vomiting.  Genitourinary: Negative for dysuria, frequency and hematuria.  Musculoskeletal: Negative for back pain, joint pain and neck pain.  Skin:       No acne, rash, or lesions  Neurological: Negative for dizziness, tremors, focal weakness and weakness.  Endo/Heme/Allergies: Negative for polydipsia. Does not bruise/bleed easily.  Psychiatric/Behavioral: Negative for depression. The patient is not nervous/anxious and does not have insomnia.      VITAL SIGNS:   Vitals:   10/07/17 2230 10/07/17 2245 10/07/17 2300 10/07/17 2330  BP: (!) 118/35 (!) 110/41 (!) 94/36 (!) 104/44  Pulse: (!) 125 (!) 124 (!) 124 (!) 118  Resp: 20 (!) 22 19 16   Temp:      TempSrc:      SpO2: 99% 99% 97% 97%  Weight:      Height:       Wt Readings from Last 3 Encounters:  10/07/17 46.7 kg (103 lb)  08/25/17 48.1 kg (106 lb)    PHYSICAL EXAMINATION:  Physical Exam  Vitals reviewed. Constitutional: She is oriented to person, place, and time. She  appears well-developed and well-nourished. No distress.  HENT:  Head: Normocephalic and atraumatic.  Mouth/Throat: Oropharynx is clear and moist.  Eyes: Conjunctivae and EOM are normal. Pupils are equal, round, and reactive to light. No scleral icterus.  Neck: Normal range of motion. Neck supple. No JVD present. No thyromegaly present.  Cardiovascular: Regular rhythm and intact distal pulses. Exam reveals no gallop and no friction rub.  No murmur heard. Tachycardic  Respiratory: Effort normal. No respiratory distress. She has no wheezes. She has no rales.  Basilar coarse breath sounds  GI: Soft. Bowel sounds are normal. She exhibits no distension. There is no tenderness.  Musculoskeletal: Normal range of motion. She exhibits no edema.  No arthritis, no gout  Lymphadenopathy:    She has no cervical adenopathy.  Neurological: She is alert and oriented to person, place, and time. No cranial nerve deficit.  No dysarthria, no aphasia  Skin: Skin is warm and dry. No rash noted. No erythema.  Psychiatric: She has a normal mood and affect. Her behavior is normal. Judgment and thought content normal.    LABORATORY PANEL:   CBC Recent Labs  Lab 10/07/17 2113  WBC 17.1*  HGB 13.9  HCT 41.9  PLT 345   ------------------------------------------------------------------------------------------------------------------  Chemistries  Recent Labs  Lab 10/07/17 2113  NA 135  K 3.3*  CL 99*  CO2 24  GLUCOSE 133*  BUN 18  CREATININE 0.95  CALCIUM 9.2  AST 21  ALT 14  ALKPHOS 67  BILITOT 1.5*   ------------------------------------------------------------------------------------------------------------------  Cardiac Enzymes No results for input(s): TROPONINI in the last 168 hours. ------------------------------------------------------------------------------------------------------------------  RADIOLOGY:  Dg Chest Port 1 View  Result Date: 10/07/2017 CLINICAL DATA:  Fever and  shortness of breath for 2 days. EXAM: PORTABLE CHEST 1 VIEW COMPARISON:  08/25/2017 and prior exams FINDINGS: Cardiomediastinal silhouette is unchanged. Interstitial prominence is again noted. No new pulmonary opacities are identified. A possible trace RIGHT pleural effusion again noted. There is no evidence of pneumothorax or acute bony abnormality. IMPRESSION: 1. No evidence of acute cardiopulmonary disease 2. Stable pulmonary opacities and interstitial prominence with possible trace RIGHT pleural effusion. Electronically Signed   By: Margarette Canada M.D.   On: 10/07/2017 21:53    EKG:   Orders placed or performed during the hospital encounter of 10/07/17  . EKG 12-Lead  . EKG 12-Lead    IMPRESSION AND PLAN:  Principal Problem:   Sepsis ([place]) -IV antibiotics.  Lactic acid within normal limits.  Blood pressure stable.  IV fluids for hydration.  Cultures sent Active Problems:   HCAP (healthcare-associated pneumonia) -IV antibiotics as above, supportive treatment PRN   COPD -home dose inhalers   HTN (hypertension) -continue home meds  All the records are reviewed and case discussed with ED provider. Management plans discussed with the patient and/or family.  DVT PROPHYLAXIS: SubQ lovenox  GI PROPHYLAXIS: None  ADMISSION STATUS: Inpatient  CODE STATUS: Full Code Status History    Date Active Date Inactive Code Status Order ID Comments User Context   08/25/2017 14:59 08/27/2017 17:47 Full Code [id number]  [name], MD ED      TOTAL TIME TAKING CARE OF THIS PATIENT: 45 minutes.   Savannah Mcclure 10/07/2017, 11:43 PM  [facility]  Office  [phone]  CC: Primary care physician; Patient, No Pcp Per  Note:  This document was prepared using Dragon voice recognition software and may include unintentional dictation errors.

## 2017-10-07 NOTE — ED Provider Notes (Signed)
[facility] Emergency Department Provider Note ____________________________________________   First MD Initiated Contact with Patient 10/07/17 2135     (approximate)  I have reviewed the triage vital signs and the nursing notes.   HISTORY  Chief Complaint Blood Infection    HPI Savannah Mcclure is a 77 y.o. female with past medical history as noted below including COPD who presents with cough for approximately the last 3-4 days, productive of green sputum, associated with fever at home, as well as with shortness of breath.  The patient reports generalized weakness.  She denies vomiting or diarrhea, chest pain, or urinary symptoms.   Past Medical History:  Diagnosis Date  . Cardiac arrest ([place])   . COPD (chronic obstructive pulmonary disease) ([place])   . Hypertension   . Scoliosis   . Sepsis ([place])   . Spinal stenosis     Patient Active Problem List   Diagnosis Date Noted  . Sepsis ([place]) 08/25/2017    Past Surgical History:  Procedure Laterality Date  . ABDOMINAL HYSTERECTOMY    . BACK SURGERY    . right eye surgery    . WISDOM TOOTH EXTRACTION      Prior to Admission medications   Medication Sig Start Date End Date Taking? Authorizing Provider  B-COMPLEX-C PO Take 1 tablet by mouth daily.   Yes [provider]  benazepril (LOTENSIN) 40 MG tablet Take 40 mg by mouth daily.   Yes [provider]  cilostazol (PLETAL) 100 MG tablet Take 100 mg by mouth 2 (two) times daily.   Yes [provider]  clonazePAM (KLONOPIN) 0.5 MG tablet Take 0.5 mg by mouth at bedtime.    Yes [provider]  Fluticasone-Salmeterol (ADVAIR DISKUS) 250-50 MCG/DOSE AEPB Inhale 1 puff into the lungs 2 (two) times daily. 08/27/17 08/27/18 Yes Gouru, Illene Silver, MD  gabapentin (NEURONTIN) 100 MG capsule Take 100 mg by mouth 2 (two) times daily.   Yes [provider]  lidocaine (LIDODERM) 5 % Place 1 patch onto the skin daily.  Remove & Discard patch within 12 hours or as directed by MD   Yes [provider]  Multiple Vitamins-Minerals (MULTIVITAMIN WITH MINERALS) tablet Take 1 tablet by mouth daily.   Yes [provider]  niacinamide 100 MG tablet Take 100 mg by mouth 2 (two) times daily with a meal.   Yes [provider]  oxyCODONE (OXY IR/ROXICODONE) 5 MG immediate release tablet Take 5 mg by mouth 2 (two) times daily as needed for severe pain.    Yes [provider]  tiotropium (SPIRIVA HANDIHALER) 18 MCG inhalation capsule Place 1 capsule (18 mcg total) into inhaler and inhale daily. 08/27/17 08/27/18 Yes Gouru, Aruna, MD  albuterol (PROVENTIL HFA;VENTOLIN HFA) 108 (90 Base) MCG/ACT inhaler Inhale 2 puffs into the lungs every 6 (six) hours as needed for wheezing or shortness of breath. Patient not taking: Reported on 10/07/2017 08/27/17   Nicholes Mango, MD  guaiFENesin-dextromethorphan (ROBITUSSIN DM) 100-10 MG/5ML syrup Take 10 mLs by mouth every 6 (six) hours as needed for cough (chest congestion). Patient not taking: Reported on 10/07/2017 08/27/17   Nicholes Mango, MD  predniSONE (STERAPRED UNI-PAK 21 TAB) 10 MG (21) TBPK tablet Take 1 tablet (10 mg total) by mouth daily. Take 6 tablets by mouth for 1 day followed by  5 tablets by mouth for 1 day followed by  4 tablets by mouth for 1 day followed by  3 tablets by mouth for 1 day followed  by  2 tablets by mouth for 1 day followed by  1 tablet by mouth for a day and stop Patient not taking: Reported on 10/07/2017 08/27/17   Nicholes Mango, MD    Allergies Amlodipine; Azithromycin; Doxycycline; Erythromycin; Hydrocodone; Minocycline; Nsaids; Relafen [nabumetone]; Sulfa antibiotics; and Tetracyclines & related  Family History  Problem Relation Age of Onset  . Intracerebral hemorrhage Mother   . Hypertension Mother   . Heart failure Father     Social History Social History   Tobacco Use  . Smoking status: Current Every Day Smoker      Packs/day: 0.50  . Smokeless tobacco: Never Used  Substance Use Topics  . Alcohol use: No    Frequency: Never  . Drug use: No    Review of Systems  Constitutional: Positive for fever. Eyes: No redness. ENT: No sore throat. Cardiovascular: Denies chest pain. Respiratory: Positive for shortness of breath. Gastrointestinal: No nausea, no vomiting.  No diarrhea.  Genitourinary: Negative for dysuria.  Musculoskeletal: Negative for back pain. Skin: Negative for rash. Neurological: Negative for headache.   ____________________________________________   PHYSICAL EXAM:  VITAL SIGNS: ED Triage Vitals  Enc Vitals Group     BP 10/07/17 2100 (!) 139/59     Pulse Rate 10/07/17 2100 (!) 140     Resp 10/07/17 2100 (!) 28     Temp 10/07/17 2100 99.6 F (37.6 C)     Temp Source 10/07/17 2100 Oral     SpO2 10/07/17 2059 96 %     Weight 10/07/17 2101 103 lb (46.7 kg)     Height 10/07/17 2101 5\' 3"  (1.6 m)     Head Circumference --      Peak Flow --      Pain Score 10/07/17 2100 8     Pain Loc --      Pain Edu? --      Excl. in [place]? --     Constitutional: Alert and oriented.  Weak appearing but in no acute distress.   Eyes: Conjunctivae are normal.  Head: Atraumatic. Nose: No congestion/rhinnorhea. Mouth/Throat: Mucous membranes are dry.   Neck: Normal range of motion.  Cardiovascular: Tachycardic, regular rhythm. Grossly normal heart sounds.  Good peripheral circulation. Respiratory: Slightly increased respiratory effort.  No retractions.  Somewhat decreased breath sounds bilaterally but lungs otherwise CTAB. Gastrointestinal: Soft and nontender. No distention.  Genitourinary: No CVA tenderness. Musculoskeletal: No lower extremity edema.  Extremities warm and well perfused.  Neurologic:  Normal speech and language. No gross focal neurologic deficits are appreciated.  Skin:  Skin is warm and dry. No rash noted. Psychiatric: Mood and affect are normal. Speech and behavior are  normal.  ____________________________________________   LABS (all labs ordered are listed, but only abnormal results are displayed)  Labs Reviewed  COMPREHENSIVE METABOLIC PANEL - Abnormal; Notable for the following components:      Result Value   Potassium 3.3 (*)    Chloride 99 (*)    Glucose, Bld 133 (*)    Total Protein 8.2 (*)    Total Bilirubin 1.5 (*)    GFR calc non Af Amer 56 (*)    All other components within normal limits  CBC WITH DIFFERENTIAL/PLATELET - Abnormal; Notable for the following components:   WBC 17.1 (*)    Neutro Abs 14.6 (*)    Monocytes Absolute 1.1 (*)    All other components within normal limits  CULTURE, BLOOD (ROUTINE X 2)  CULTURE, BLOOD (ROUTINE X 2)  LACTIC  ACID, PLASMA  PROTIME-INR  INFLUENZA PANEL BY PCR (TYPE A & B)  URINALYSIS, COMPLETE (UACMP) WITH MICROSCOPIC   ____________________________________________  EKG  ED ECG REPORT I, Arta Silence, the attending physician, personally viewed and interpreted this ECG.  Date: 10/07/2017 EKG Time: 2104 Rate: 139 Rhythm: Sinus tachycardia QRS Axis: normal Intervals: Right bundle branch block ST/T Wave abnormalities: Nonspecific abnormalities, likely rate related Narrative Interpretation: no evidence of acute ischemia  ____________________________________________  RADIOLOGY  Chest x-ray: No focal infiltrate.  Stable bilateral opacities.  ____________________________________________   PROCEDURES  Procedure(s) performed: No  Procedures  Critical Care performed: Yes  CRITICAL CARE Performed by: Arta Silence   Total critical care time: 30 minutes  Critical care time was exclusive of separately billable procedures and treating other patients.  Critical care was necessary to treat or prevent imminent or life-threatening deterioration.  Critical care was time spent personally by me on the following activities: development of treatment plan with patient and/or  surrogate as well as nursing, discussions with consultants, evaluation of patient's response to treatment, examination of patient, obtaining history from patient or surrogate, ordering and performing treatments and interventions, ordering and review of laboratory studies, ordering and review of radiographic studies, pulse oximetry and re-evaluation of patient's condition. ____________________________________________   INITIAL IMPRESSION / ASSESSMENT AND PLAN / ED COURSE  Pertinent labs & imaging results that were available during my care of the patient were reviewed by me and considered in my medical decision making (see chart for details).  77 year old female with past medical history as noted above including COPD and prior history of sepsis presents with cough and shortness of breath over the last 3-4 days, associated with fever at home.  On exam, the patient was significantly tachycardic, with borderline temperature, but otherwise normal vital signs.  She was not in acute respiratory distress by the time of my exam but was uncomfortable appearing.  Remainder of exam as described above.  I reviewed the past medical records in epic; patient was last admitted last month for COPD exacerbation, sepsis, and community acquired pneumonia.  Differential includes recurrent pneumonia, COPD exacerbation, influenza, or less likely cardiac cause.  Plan for chest x-ray: Infection/sepsis workup, IV fluids, empiric antibiotics, and reassess.    ----------------------------------------- 11:13 PM on 10/07/2017 -----------------------------------------  Chest x-ray does not show new focal infiltrate but stable pulmonary opacities from prior.  This still patient's most likely source of infection.  Lactate is not elevated.  Empiric antibiotics have been given, and patient's vital signs have improved slightly.  Due to the tachycardia, and concern for possible atypical pneumonia/early sepsis, the patient will be  admitted.  I signed the patient out to the hospitalist Dr. Jannifer Franklin.  ____________________________________________   FINAL CLINICAL IMPRESSION(S) / ED DIAGNOSES  Final diagnoses:  Shortness of breath      NEW MEDICATIONS STARTED DURING THIS VISIT:  New Prescriptions   No medications on file     Note:  This document was prepared using Dragon voice recognition software and may include unintentional dictation errors.    Arta Silence, MD 10/07/17 2314

## 2017-10-07 NOTE — Progress Notes (Signed)
Pharmacy Antibiotic Note  Savannah Mcclure is a 77 y.o. female admitted on 10/07/2017 with sepsis.  Pharmacy has been consulted for vancomycin and Zosyn dosing.  Plan: Zosyn 3.375g IV q8h (4 hour infusion).   DW 47kg  Vd 33L kei 0.035 hr-1  T1/2 20 hours Vancomycin 750 mg q 24 hours ordered with stacked dosing. Level before 5th dose. Goal trough 15-20.   Height: 5\' 3"  (160 cm) Weight: 103 lb (46.7 kg) IBW/kg (Calculated) : 52.4  Temp (24hrs), Avg:99.6 F (37.6 C), Min:99.6 F (37.6 C), Max:99.6 F (37.6 C)  Recent Labs  Lab 10/07/17 2113  WBC 17.1*  CREATININE 0.95  LATICACIDVEN 1.1    Estimated Creatinine Clearance: 36.6 mL/min (by C-G formula based on SCr of 0.95 mg/dL).    Allergies  Allergen Reactions  . Amlodipine Hives  . Azithromycin Hives  . Doxycycline Diarrhea  . Erythromycin Diarrhea  . Hydrocodone Nausea Only  . Minocycline Diarrhea  . Nsaids Other (See Comments)    Internal Bleeding  . Relafen [Nabumetone] Hives  . Sulfa Antibiotics Hives  . Tetracyclines & Related Diarrhea    Antimicrobials this admission: Vancomycin, Zosyn 2/23 >>    >>   Dose adjustments this admission:   Microbiology results: 2/23 BCx: pending      2/23 UA: pending 2/23 CXR: No evidence of acute cardiopulmonary disease Thank you for allowing pharmacy to be a part of this patient's care.  Rosabelle Jupin S 10/07/2017 11:52 PM

## 2017-10-07 NOTE — ED Notes (Signed)
Pt updated on care plan. Pt verbalizes understanding, additional warm blankets provided.

## 2017-10-07 NOTE — ED Triage Notes (Signed)
Pt arrives POV to triage with c/o fever and URI x 2 days. Pt is tachycardic in triage and is experiencing labored breathing.

## 2017-10-08 DIAGNOSIS — A419 Sepsis, unspecified organism: Principal | ICD-10-CM

## 2017-10-08 DIAGNOSIS — J441 Chronic obstructive pulmonary disease with (acute) exacerbation: Secondary | ICD-10-CM

## 2017-10-08 LAB — URINALYSIS, ROUTINE W REFLEX MICROSCOPIC
Bilirubin Urine: NEGATIVE
Glucose, UA: NEGATIVE mg/dL
HGB URINE DIPSTICK: NEGATIVE
Ketones, ur: NEGATIVE mg/dL
LEUKOCYTES UA: NEGATIVE
Nitrite: NEGATIVE
PROTEIN: NEGATIVE mg/dL
SPECIFIC GRAVITY, URINE: 1.012 (ref 1.005–1.030)
pH: 5 (ref 5.0–8.0)

## 2017-10-08 LAB — CBC
HEMATOCRIT: 31 % — AB (ref 35.0–47.0)
HEMOGLOBIN: 10.1 g/dL — AB (ref 12.0–16.0)
MCH: 31 pg (ref 26.0–34.0)
MCHC: 32.7 g/dL (ref 32.0–36.0)
MCV: 94.8 fL (ref 80.0–100.0)
Platelets: 260 10*3/uL (ref 150–440)
RBC: 3.27 MIL/uL — ABNORMAL LOW (ref 3.80–5.20)
RDW: 13.2 % (ref 11.5–14.5)
WBC: 14.4 10*3/uL — AB (ref 3.6–11.0)

## 2017-10-08 LAB — BASIC METABOLIC PANEL
ANION GAP: 9 (ref 5–15)
ANION GAP: 9 (ref 5–15)
BUN: 17 mg/dL (ref 6–20)
BUN: 12 mg/dL (ref 6–20)
CALCIUM: 6.9 mg/dL — AB (ref 8.9–10.3)
CHLORIDE: 105 mmol/L (ref 101–111)
CO2: 21 mmol/L — ABNORMAL LOW (ref 22–32)
CO2: 18 mmol/L — AB (ref 22–32)
CREATININE: 0.76 mg/dL (ref 0.44–1.00)
Calcium: 7.5 mg/dL — ABNORMAL LOW (ref 8.9–10.3)
Chloride: 110 mmol/L (ref 101–111)
Creatinine, Ser: 0.91 mg/dL (ref 0.44–1.00)
GFR calc Af Amer: >60 mL/min (ref >60)
GFR calc non Af Amer: 59 mL/min — ABNORMAL LOW (ref >60)
GLUCOSE: 185 mg/dL — AB (ref 65–99)
Glucose, Bld: 252 mg/dL — ABNORMAL HIGH (ref 65–99)
POTASSIUM: 2.8 mmol/L — AB (ref 3.5–5.1)
Potassium: 3.5 mmol/L (ref 3.5–5.1)
SODIUM: 135 mmol/L (ref 135–145)
SODIUM: 137 mmol/L (ref 135–145)

## 2017-10-08 LAB — MRSA PCR SCREENING: MRSA by PCR: NEGATIVE

## 2017-10-08 LAB — LACTIC ACID, PLASMA
Lactic Acid, Venous: 2.4 mmol/L (ref 0.5–1.9)
Lactic Acid, Venous: 1.7 mmol/L (ref 0.5–1.9)

## 2017-10-08 LAB — GLUCOSE, CAPILLARY: GLUCOSE-CAPILLARY: 149 mg/dL — AB (ref 65–99)

## 2017-10-08 MED ORDER — POTASSIUM CHLORIDE 20 MEQ PO PACK: 40.0000 meq | PACK | Freq: Every day | ORAL | Status: DC

## 2017-10-08 MED ORDER — POTASSIUM CHLORIDE CRYS ER 20 MEQ PO TBCR
40.0000 meq | EXTENDED_RELEASE_TABLET | ORAL | Status: DC
  Administered 2017-10-08: 40 meq via ORAL
  Filled 2017-10-08: qty 2

## 2017-10-08 MED ORDER — GABAPENTIN 100 MG PO CAPS
100.0000 mg | ORAL_CAPSULE | Freq: Two times a day (BID) | ORAL | Status: DC
  Administered 2017-10-08 – 2017-10-13 (×12): 100 mg via ORAL
  Filled 2017-10-08 (×12): qty 1

## 2017-10-08 MED ORDER — TIOTROPIUM BROMIDE MONOHYDRATE 18 MCG IN CAPS
18.0000 ug | ORAL_CAPSULE | Freq: Every day | RESPIRATORY_TRACT | Status: DC
  Filled 2017-10-08: qty 5

## 2017-10-08 MED ORDER — BLISTEX MEDICATED EX OINT
TOPICAL_OINTMENT | CUTANEOUS | Status: DC | PRN
  Filled 2017-10-08: qty 6.3

## 2017-10-08 MED ORDER — CILOSTAZOL 100 MG PO TABS
100.0000 mg | ORAL_TABLET | Freq: Two times a day (BID) | ORAL | Status: DC
  Administered 2017-10-08 – 2017-10-13 (×12): 100 mg via ORAL
  Filled 2017-10-08 (×14): qty 1

## 2017-10-08 MED ORDER — MOMETASONE FURO-FORMOTEROL FUM 200-5 MCG/ACT IN AERO
2.0000 | INHALATION_SPRAY | Freq: Two times a day (BID) | RESPIRATORY_TRACT | Status: DC
  Administered 2017-10-08: 2 via RESPIRATORY_TRACT
  Filled 2017-10-08: qty 8.8

## 2017-10-08 MED ORDER — SODIUM CHLORIDE 0.9 % IV SOLN
INTRAVENOUS | Status: AC
  Administered 2017-10-08 (×2): via INTRAVENOUS

## 2017-10-08 MED ORDER — POTASSIUM CHLORIDE 20 MEQ PO PACK
40.0000 meq | PACK | Freq: Once | ORAL | Status: DC
Start: 2017-10-08 — End: 2017-10-08
  Filled 2017-10-08: qty 2

## 2017-10-08 MED ORDER — BUDESONIDE 0.25 MG/2ML IN SUSP: 0.2500 mg | Freq: Two times a day (BID) | RESPIRATORY_TRACT | Status: DC

## 2017-10-08 MED ORDER — ACETAMINOPHEN 650 MG RE SUPP: 650.0000 mg | Freq: Four times a day (QID) | RECTAL | Status: DC | PRN

## 2017-10-08 MED ORDER — CLONAZEPAM 0.5 MG PO TABS
0.5000 mg | ORAL_TABLET | Freq: Every day | ORAL | Status: DC
  Administered 2017-10-08 – 2017-10-12 (×5): 0.5 mg via ORAL
  Filled 2017-10-08 (×6): qty 1

## 2017-10-08 MED ORDER — SODIUM CHLORIDE 0.9 % IV BOLUS (SEPSIS)
500.0000 mL | Freq: Once | INTRAVENOUS | Status: AC
  Administered 2017-10-08: 500 mL via INTRAVENOUS

## 2017-10-08 MED ORDER — OXYCODONE HCL 5 MG PO TABS
5.0000 mg | ORAL_TABLET | Freq: Two times a day (BID) | ORAL | Status: DC | PRN
Start: 2017-10-08 — End: 2017-10-11
  Administered 2017-10-08 – 2017-10-11 (×4): 5 mg via ORAL
  Filled 2017-10-08 (×4): qty 1

## 2017-10-08 MED ORDER — SODIUM CHLORIDE 0.9 % IV BOLUS (SEPSIS)
1000.0000 mL | Freq: Once | INTRAVENOUS | Status: AC
  Administered 2017-10-08: 1000 mL via INTRAVENOUS

## 2017-10-08 MED ORDER — HYDROCORTISONE NA SUCCINATE PF 100 MG IJ SOLR
50.0000 mg | Freq: Four times a day (QID) | INTRAMUSCULAR | Status: DC
  Administered 2017-10-08 – 2017-10-10 (×9): 50 mg via INTRAVENOUS
  Filled 2017-10-08 (×8): qty 2

## 2017-10-08 MED ORDER — IPRATROPIUM-ALBUTEROL 0.5-2.5 (3) MG/3ML IN SOLN
3.0000 mL | RESPIRATORY_TRACT | Status: DC
  Administered 2017-10-08 – 2017-10-12 (×24): 3 mL via RESPIRATORY_TRACT
  Filled 2017-10-08 (×26): qty 3

## 2017-10-08 MED ORDER — GUAIFENESIN-DM 100-10 MG/5ML PO SYRP
5.0000 mL | ORAL_SOLUTION | ORAL | Status: DC | PRN
  Filled 2017-10-08: qty 5

## 2017-10-08 MED ORDER — IPRATROPIUM-ALBUTEROL 0.5-2.5 (3) MG/3ML IN SOLN: 3.0000 mL | RESPIRATORY_TRACT | Status: DC | PRN

## 2017-10-08 MED ORDER — PHENYLEPHRINE HCL 10 MG/ML IJ SOLN
0.0000 ug/min | INTRAMUSCULAR | Status: DC
  Administered 2017-10-08: 20 ug/min via INTRAVENOUS
  Administered 2017-10-08: 60 ug/min via INTRAVENOUS
  Administered 2017-10-09: 15 ug/min via INTRAVENOUS
  Filled 2017-10-08 (×3): qty 10
  Filled 2017-10-08: qty 1

## 2017-10-08 MED ORDER — BENZONATATE 100 MG PO CAPS
100.0000 mg | ORAL_CAPSULE | Freq: Three times a day (TID) | ORAL | Status: DC
  Administered 2017-10-08 – 2017-10-10 (×8): 100 mg via ORAL
  Filled 2017-10-08 (×8): qty 1

## 2017-10-08 MED ORDER — POTASSIUM CHLORIDE 10 MEQ/100ML IV SOLN
10.0000 meq | INTRAVENOUS | Status: AC
  Administered 2017-10-08 (×4): 10 meq via INTRAVENOUS
  Filled 2017-10-08 (×4): qty 100

## 2017-10-08 MED ORDER — ENOXAPARIN SODIUM 40 MG/0.4ML ~~LOC~~ SOLN
40.0000 mg | SUBCUTANEOUS | Status: DC
  Administered 2017-10-08 – 2017-10-11 (×4): 40 mg via SUBCUTANEOUS
  Filled 2017-10-08 (×4): qty 0.4

## 2017-10-08 MED ORDER — ONDANSETRON HCL 4 MG PO TABS: 4.0000 mg | ORAL_TABLET | Freq: Four times a day (QID) | ORAL | Status: DC | PRN

## 2017-10-08 MED ORDER — SODIUM CHLORIDE 0.9 % IV BOLUS (SEPSIS): 500.0000 mL | INTRAVENOUS | Status: DC

## 2017-10-08 MED ORDER — SODIUM CHLORIDE 0.9% FLUSH
10.0000 mL | Freq: Two times a day (BID) | INTRAVENOUS | Status: DC
  Administered 2017-10-08 – 2017-10-13 (×9): 10 mL

## 2017-10-08 MED ORDER — ACETAMINOPHEN 325 MG PO TABS: 650.0000 mg | ORAL_TABLET | Freq: Four times a day (QID) | ORAL | Status: DC | PRN

## 2017-10-08 MED ORDER — ONDANSETRON HCL 4 MG/2ML IJ SOLN: 4.0000 mg | Freq: Four times a day (QID) | INTRAMUSCULAR | Status: DC | PRN

## 2017-10-08 MED ORDER — GUAIFENESIN-CODEINE 100-10 MG/5ML PO SOLN
10.0000 mL | Freq: Four times a day (QID) | ORAL | Status: DC
  Administered 2017-10-08 – 2017-10-09 (×5): 10 mL via ORAL
  Filled 2017-10-08 (×5): qty 10

## 2017-10-08 MED ORDER — BUDESONIDE 0.25 MG/2ML IN SUSP
0.2500 mg | Freq: Two times a day (BID) | RESPIRATORY_TRACT | Status: DC
  Administered 2017-10-08 – 2017-10-09 (×2): 0.25 mg via RESPIRATORY_TRACT
  Filled 2017-10-08 (×2): qty 2

## 2017-10-08 MED ORDER — SODIUM CHLORIDE 0.9% FLUSH: 10.0000 mL | INTRAVENOUS | Status: DC | PRN

## 2017-10-08 NOTE — Progress Notes (Addendum)
Post bolus BP=80/32, Dr. Marcille Blanco at bedside and ordered for transfer to stepdown. Another bag of NS 545ml bolus started as ordered.

## 2017-10-08 NOTE — Consult Note (Signed)
PULMONARY / CRITICAL CARE MEDICINE   Name: Savannah Mcclure MRN: [id number] DOB: 08/05/1941    ADMISSION DATE:  10/07/2017   CONSULTATION DATE:  10/08/2017  REFERRING MD:  Dr Marcille Blanco  REASON: Hypotension  HISTORY OF PRESENT ILLNESS:   This is a 77 year old Caucasian female with a past medical history as indicated below, current smoker, recent hospitalization for sepsis, pneumonia and COPD exacerbation who presents with fever and shortness of breath times 2 days.  Patient states that she never felt fully recovered post discharge.  She reports persistent fever, a productive cough, and generalized weakness.  Her ED workup revealed persistent pneumonia on chest x-ray and small pleural effusions.  She was admitted to the floor for management but upon arrival on the floor she became hypotensive.  She was given fluid boluses without any significant improvement in her blood pressure hence she was transferred to the ICU for further management.  I mean arterial blood pressure remains in the low 60s.  She reports being around sick contacts specifically her grandson who had an upper respiratory tract infection but no influenza  PAST MEDICAL HISTORY :  She  has a past medical history of Cardiac arrest ([place]), COPD (chronic obstructive pulmonary disease) ([place]), Hypertension, Scoliosis, Sepsis ([place]), and Spinal stenosis.  PAST SURGICAL HISTORY: She  has a past surgical history that includes Abdominal hysterectomy; right eye surgery; Wisdom tooth extraction; and Back surgery.  Allergies  Allergen Reactions  . Amlodipine Hives  . Azithromycin Hives  . Doxycycline Diarrhea  . Erythromycin Diarrhea  . Hydrocodone Nausea Only  . Minocycline Diarrhea  . Nsaids Other (See Comments)    Internal Bleeding  . Relafen [Nabumetone] Hives  . Sulfa Antibiotics Hives  . Tetracyclines & Related Diarrhea    No current facility-administered medications on file prior to encounter.    Current Outpatient  Medications on File Prior to Encounter  Medication Sig  . B-COMPLEX-C PO Take 1 tablet by mouth daily.  . benazepril (LOTENSIN) 40 MG tablet Take 40 mg by mouth daily.  . cilostazol (PLETAL) 100 MG tablet Take 100 mg by mouth 2 (two) times daily.  . clonazePAM (KLONOPIN) 0.5 MG tablet Take 0.5 mg by mouth at bedtime.   . Fluticasone-Salmeterol (ADVAIR DISKUS) 250-50 MCG/DOSE AEPB Inhale 1 puff into the lungs 2 (two) times daily.  Marland Kitchen gabapentin (NEURONTIN) 100 MG capsule Take 100 mg by mouth 2 (two) times daily.  Marland Kitchen lidocaine (LIDODERM) 5 % Place 1 patch onto the skin daily. Remove & Discard patch within 12 hours or as directed by MD  . Multiple Vitamins-Minerals (MULTIVITAMIN WITH MINERALS) tablet Take 1 tablet by mouth daily.  . niacinamide 100 MG tablet Take 100 mg by mouth 2 (two) times daily with a meal.  . oxyCODONE (OXY IR/ROXICODONE) 5 MG immediate release tablet Take 5 mg by mouth 2 (two) times daily as needed for severe pain.   Marland Kitchen tiotropium (SPIRIVA HANDIHALER) 18 MCG inhalation capsule Place 1 capsule (18 mcg total) into inhaler and inhale daily.  Marland Kitchen albuterol (PROVENTIL HFA;VENTOLIN HFA) 108 (90 Base) MCG/ACT inhaler Inhale 2 puffs into the lungs every 6 (six) hours as needed for wheezing or shortness of breath. (Patient not taking: Reported on 10/07/2017)  . guaiFENesin-dextromethorphan (ROBITUSSIN DM) 100-10 MG/5ML syrup Take 10 mLs by mouth every 6 (six) hours as needed for cough (chest congestion). (Patient not taking: Reported on 10/07/2017)  . predniSONE (STERAPRED UNI-PAK 21 TAB) 10 MG (21) TBPK tablet Take 1 tablet (10 mg total) by mouth  daily. Take 6 tablets by mouth for 1 day followed by  5 tablets by mouth for 1 day followed by  4 tablets by mouth for 1 day followed by  3 tablets by mouth for 1 day followed by  2 tablets by mouth for 1 day followed by  1 tablet by mouth for a day and stop (Patient not taking: Reported on 10/07/2017)    FAMILY HISTORY:  Her indicated that her  mother is deceased. She indicated that her father is deceased.   SOCIAL HISTORY: She  reports that she has been smoking.  She has been smoking about 0.50 packs per day. she has never used smokeless tobacco. She reports that she does not drink alcohol or use drugs.  REVIEW OF SYSTEMS:   Constitutional: positive for fever and chills.  HENT: Negative for congestion and rhinorrhea.  Eyes: Negative for redness and visual disturbance.  Respiratory: positive for shortness of breath and wheezing.  Cardiovascular: Negative for chest pain and palpitations.  Gastrointestinal: Negative  for nausea , vomiting and abdominal pain and  Loose stools positive for anorexia Genitourinary: Negative for dysuria and urgency.  Endocrine: Denies polyuria, polyphagia and heat intolerance Musculoskeletal: Negative for myalgias and arthralgias but positive for generalized weakness.  Skin: Negative for pallor and wound.  Neurological: Negative for dizziness and headaches   SUBJECTIVE:   VITAL SIGNS: BP (!) 88/56   Pulse (!) 101   Temp 97.9 F (36.6 C) (Oral)   Resp 18   Ht 5\' 3"  (1.6 m)   Wt 104 lb 8 oz (47.4 kg)   SpO2 100%   BMI 18.51 kg/m   HEMODYNAMICS:    VENTILATOR SETTINGS:    INTAKE / OUTPUT: No intake/output data recorded.  PHYSICAL EXAMINATION: General: Acutely ill looking Neuro: Alert and oriented x3, cranial nerves intact HEENT: PERRLA Cardiovascular: Tachycardic, regular, S1-S2, no murmur regurg or gallop Lungs: Increased work of breathing, diffuse rhonchi and wheezes in all lung fields, bibasilar Rales Abdomen: Nondistended, normal bowel sounds Musculoskeletal: No deformities Skin: Warm and dry  LABS:  BMET Recent Labs  Lab 10/07/17 2113 10/08/17 0151  NA 135 135  K 3.3* 2.8*  CL 99* 105  CO2 24 21*  BUN 18 17  CREATININE 0.95 0.91  GLUCOSE 133* 185*    Electrolytes Recent Labs  Lab 10/07/17 2113 10/08/17 0151  CALCIUM 9.2 7.5*    CBC Recent Labs  Lab  10/07/17 2113 10/08/17 0151  WBC 17.1* 14.4*  HGB 13.9 10.1*  HCT 41.9 31.0*  PLT 345 260    Coag's Recent Labs  Lab 10/07/17 2113  INR 1.07    Sepsis Markers Recent Labs  Lab 10/07/17 2113 10/08/17 0151  LATICACIDVEN 1.1 2.4*    ABG No results for input(s): PHART, PCO2ART, PO2ART in the last 168 hours.  Liver Enzymes Recent Labs  Lab 10/07/17 2113  AST 21  ALT 14  ALKPHOS 67  BILITOT 1.5*  ALBUMIN 4.4    Cardiac Enzymes No results for input(s): TROPONINI, PROBNP in the last 168 hours.  Glucose Recent Labs  Lab 10/08/17 0426  GLUCAP 149*    Imaging Dg Chest Port 1 View  Result Date: 10/07/2017 CLINICAL DATA:  Fever and shortness of breath for 2 days. EXAM: PORTABLE CHEST 1 VIEW COMPARISON:  08/25/2017 and prior exams FINDINGS: Cardiomediastinal silhouette is unchanged. Interstitial prominence is again noted. No new pulmonary opacities are identified. A possible trace RIGHT pleural effusion again noted. There is no evidence of pneumothorax or acute  bony abnormality. IMPRESSION: 1. No evidence of acute cardiopulmonary disease 2. Stable pulmonary opacities and interstitial prominence with possible trace RIGHT pleural effusion. Electronically Signed   By: Margarette Canada M.D.   On: 10/07/2017 21:53    CULTURES: Blood cultures x2  ANTIBIOTICS: Vancomycin-discontinued Zosyn  SIGNIFICANT EVENTS: 01/13>discharged 02/23>readmitted  LINES/TUBES: PIVs  DISCUSSION: 77 year old female presenting with recurrent pneumonia, sepsis secondary to pneumonia, septic shock and acute COPD exacerbation.  Rapid influenza negative  ASSESSMENT Septic shock Sepsis secondary to pneumonia Acute COPD exacerbation Healthcare acquired pneumonia Current smoker History of hypertension  PLAN Supplemental oxygen to maintain SPO2 greater than 90 Antibiotics as above IV fluids and pressors to maintain mean arterial blood pressure greater than 65 Follow-up cultures Inhaled  bronchodilators and steroids IV steroids Pulmonary hygiene Flutter valve and incentive spirometer GI and DVT prophylaxis  FAMILY  - Updates: Patient updated on current treatment plan  - Inter-disciplinary family meet or Palliative Care meeting due by: day 7  Amamda Curbow S. [facility] ANP-BC Pulmonary and Critical Care Medicine [facility] Pager [phone] or [phone]  NB: This document was prepared using Dragon voice recognition software and may include unintentional dictation errors.   10/08/2017, 6:03 AM

## 2017-10-08 NOTE — Progress Notes (Signed)
CRITICAL VALUE ALERT  Critical value received:  Lactic acid - 2.4  Date of notification: 10/08/2017  Time of notification: 8177  Critical value read back:Yes.    Nurse who received alert:  Maretta Bees., RN  MD notified (1st page):  Dr. Marcille Blanco  Time of first page: 0244  MD notified (2nd page):  Time of second page:  Responding MD:  Dr. Marcille Blanco with orders made.  Time MD responded: 1165

## 2017-10-08 NOTE — Progress Notes (Signed)
Report called to [facility], ICU RN. Pt going to ICU 16.

## 2017-10-08 NOTE — Progress Notes (Signed)
Spoke and updated Dr. Marcille Blanco about pt's latest BP=91/27, last 272ml of NS infusing. Dr. Marcille Blanco ordered for another 540ml NS bolus once previous bolus is done. Will carry out order and continue to monitor.

## 2017-10-08 NOTE — Progress Notes (Signed)
Pt admitted in room 141 at [place]. BP upon admission 62/42, pt alert and oriented, denies chest pain and not in respiratory distress. BP rechecked, 55/35 and 58/32 manually. Dr. Jannifer Franklin paged and ordered to give NS bolus 1000 ml over 2 hours and monitor BP. Nursing supervisor [name] aware and at the bedside. Bolus started at 0101. BP checked after 15 mins. 59/37. Dr. Jannifer Franklin at bedside and ordered for EKG. Pt made aware of the treatment plan. Will continue to closely monitor.

## 2017-10-08 NOTE — Progress Notes (Signed)
[place] at [facility]                                                                                                                                                                                  Patient Demographics   [facility], is a 77 y.o. female, DOB - [birth year], ERX:[id number]  Admit date - 10/07/2017   Admitting Physician Lance Coon, MD  Outpatient Primary MD for the patient is Patient, No Pcp Per   LOS - 1  Subjective: Patient was hospitalized last month with pneumonia readmitted with shortness of breath now noted to have hypotension Patient continues to have shortness of breath and cough   Review of Systems:   CONSTITUTIONAL: No documented fever. No fatigue, weakness. No weight gain, no weight loss.  EYES: No blurry or double vision.  ENT: No tinnitus. No postnasal drip. No redness of the oropharynx.  RESPIRATORY positive cough, no wheeze, no hemoptysis.  Positive dyspnea.  CARDIOVASCULAR: No chest pain. No orthopnea. No palpitations. No syncope.  GASTROINTESTINAL: No nausea, no vomiting or diarrhea. No abdominal pain. No melena or hematochezia.  GENITOURINARY: No dysuria or hematuria.  ENDOCRINE: No polyuria or nocturia. No heat or cold intolerance.  HEMATOLOGY: No anemia. No bruising. No bleeding.  INTEGUMENTARY: No rashes. No lesions.  MUSCULOSKELETAL: No arthritis. No swelling. No gout.  NEUROLOGIC: No numbness, tingling, or ataxia. No seizure-type activity.  PSYCHIATRIC: No anxiety. No insomnia. No ADD.    Vitals:   Vitals:   10/08/17 1500 10/08/17 1515 10/08/17 1530 10/08/17 1600  BP: 98/62 (!) 95/52 106/69 120/76  Pulse: 99 85 86 (!) 110  Resp: (!) 28 (!) 21 (!) 25 17  Temp:    98.6 F (37 C)  TempSrc:    Oral  SpO2: 98% 96% 97% 97%  Weight:      Height:        Wt Readings from Last 3 Encounters:  10/08/17 104 lb 8 oz (47.4 kg)  08/25/17 106 lb (48.1 kg)     Intake/Output Summary (Last 24 hours) at  10/08/2017 1653 Last data filed at 10/08/2017 1600 Gross per 24 hour  Intake 3078.67 ml  Output 750 ml  Net 2328.67 ml    Physical Exam:   GENERAL: Pleasant-appearing in no apparent distress.  HEAD, EYES, EARS, NOSE AND THROAT: Atraumatic, normocephalic. Extraocular muscles are intact. Pupils equal and reactive to light. Sclerae anicteric. No conjunctival injection. No oro-pharyngeal erythema.  NECK: Supple. There is no jugular venous distention. No bruits, no lymphadenopathy, no thyromegaly.  HEART: Regular rate and rhythm,. No murmurs, no rubs, no clicks.  LUNGS: Bilateral wheezes throughout both lung ABDOMEN: Soft, flat, nontender, nondistended.  Has good bowel sounds. No hepatosplenomegaly appreciated.  EXTREMITIES: No evidence of any cyanosis, clubbing, or peripheral edema.  +2 pedal and radial pulses bilaterally.  NEUROLOGIC: The patient is alert, awake, and oriented x3 with no focal motor or sensory deficits appreciated bilaterally.  SKIN: Moist and warm with no rashes appreciated.  Psych: Not anxious, depressed LN: No inguinal LN enlargement    Antibiotics   Anti-infectives (From admission, onward)   Start     Dose/Rate Route Frequency Ordered Stop   10/08/17 2100  vancomycin (VANCOCIN) IVPB 750 mg/150 ml premix  Status:  Discontinued     750 mg 150 mL/hr over 60 Minutes Intravenous Every 24 hours 10/07/17 2350 10/08/17 0734   10/08/17 0400  piperacillin-tazobactam (ZOSYN) IVPB 3.375 g     3.375 g 12.5 mL/hr over 240 Minutes Intravenous Every 8 hours 10/07/17 2350     10/07/17 2130  piperacillin-tazobactam (ZOSYN) IVPB 3.375 g     3.375 g 100 mL/hr over 30 Minutes Intravenous  Once 10/07/17 2128 10/07/17 2153   10/07/17 2130  vancomycin (VANCOCIN) IVPB 1000 mg/200 mL premix     1,000 mg 200 mL/hr over 60 Minutes Intravenous  Once 10/07/17 2128 10/07/17 2235      Medications   Scheduled Meds: . benzonatate  100 mg Oral TID  . budesonide (PULMICORT) nebulizer solution   0.25 mg Nebulization BID  . cilostazol  100 mg Oral BID  . clonazePAM  0.5 mg Oral QHS  . enoxaparin (LOVENOX) injection  40 mg Subcutaneous Q24H  . gabapentin  100 mg Oral BID  . guaiFENesin-codeine  10 mL Oral Q6H  . hydrocortisone sod succinate (SOLU-CORTEF) inj  50 mg Intravenous Q6H  . ipratropium-albuterol  3 mL Nebulization Q4H  . sodium chloride flush  10-40 mL Intracatheter Q12H   Continuous Infusions: . phenylephrine (NEO-SYNEPHRINE) Adult infusion 60 mcg/min (10/08/17 1600)  . piperacillin-tazobactam (ZOSYN)  IV Stopped (10/08/17 1543)   PRN Meds:.acetaminophen **OR** acetaminophen, ondansetron **OR** ondansetron (ZOFRAN) IV, oxyCODONE, sodium chloride flush   Data Review:   Micro Results Recent Results (from the past 240 hour(s))  Culture, blood (Routine x 2)     Status: None (Preliminary result)   Collection Time: 10/07/17  9:10 PM  Result Value Ref Range Status   Specimen Description BLOOD RIGHT ANTECUBITAL  Final   Special Requests   Final    BOTTLES DRAWN AEROBIC AND ANAEROBIC Blood Culture results may not be optimal due to an excessive volume of blood received in culture bottles   Culture   Final    NO GROWTH < 12 HOURS Performed at [facility], [address], [place], [place] [address]    Report Status PENDING  Incomplete  Culture, blood (Routine x 2)     Status: None (Preliminary result)   Collection Time: 10/07/17  9:13 PM  Result Value Ref Range Status   Specimen Description BLOOD LEFT ANTECUBITAL  Final   Special Requests   Final    BOTTLES DRAWN AEROBIC AND ANAEROBIC Blood Culture adequate volume   Culture   Final    NO GROWTH < 12 HOURS Performed at [facility], [address] [place], [place] [address]    Report Status PENDING  Incomplete  MRSA PCR Screening     Status: None   Collection Time: 10/08/17  4:12 AM  Result Value Ref Range Status   MRSA by PCR NEGATIVE NEGATIVE Final    Comment:        The GeneXpert MRSA  Assay (FDA approved for NASAL specimens only), is one component of a comprehensive MRSA colonization surveillance program. It is not intended to diagnose MRSA infection nor to guide or monitor treatment for MRSA infections. Performed at [facility], [address], [place], [place] [address]     Radiology Reports Dg Chest [place] 1 View  Result Date: 10/07/2017 CLINICAL DATA:  Fever and shortness of breath for 2 days. EXAM: PORTABLE CHEST 1 VIEW COMPARISON:  08/25/2017 and prior exams FINDINGS: Cardiomediastinal silhouette is unchanged. Interstitial prominence is again noted. No new pulmonary opacities are identified. A possible trace RIGHT pleural effusion again noted. There is no evidence of pneumothorax or acute bony abnormality. IMPRESSION: 1. No evidence of acute cardiopulmonary disease 2. Stable pulmonary opacities and interstitial prominence with possible trace RIGHT pleural effusion. Electronically Signed   By: Margarette Canada M.D.   On: 10/07/2017 21:53     CBC Recent Labs  Lab 10/07/17 2113 10/08/17 0151  WBC 17.1* 14.4*  HGB 13.9 10.1*  HCT 41.9 31.0*  PLT 345 260  MCV 94.0 94.8  MCH 31.2 31.0  MCHC 33.2 32.7  RDW 13.1 13.2  LYMPHSABS 1.4  --   MONOABS 1.1*  --   EOSABS 0.0  --   BASOSABS 0.0  --     Chemistries  Recent Labs  Lab 10/07/17 2113 10/08/17 0151 10/08/17 1442  NA 135 135 137  K 3.3* 2.8* 3.5  CL 99* 105 110  CO2 24 21* 18*  GLUCOSE 133* 185* 252*  BUN 18 17 12   CREATININE 0.95 0.91 0.76  CALCIUM 9.2 7.5* 6.9*  AST 21  --   --   ALT 14  --   --   ALKPHOS 67  --   --   BILITOT 1.5*  --   --    ------------------------------------------------------------------------------------------------------------------ estimated creatinine clearance is 44.1 mL/min (by C-G formula based on SCr of 0.76 mg/dL). ------------------------------------------------------------------------------------------------------------------ No results for input(s):  HGBA1C in the last 72 hours. ------------------------------------------------------------------------------------------------------------------ No results for input(s): CHOL, HDL, LDLCALC, TRIG, CHOLHDL, LDLDIRECT in the last 72 hours. ------------------------------------------------------------------------------------------------------------------ No results for input(s): TSH, T4TOTAL, T3FREE, THYROIDAB in the last 72 hours.  Invalid input(s): FREET3 ------------------------------------------------------------------------------------------------------------------ No results for input(s): VITAMINB12, FOLATE, FERRITIN, TIBC, IRON, RETICCTPCT in the last 72 hours.  Coagulation profile Recent Labs  Lab 10/07/17 2113  INR 1.07    No results for input(s): DDIMER in the last 72 hours.  Cardiac Enzymes No results for input(s): CKMB, TROPONINI, MYOGLOBIN in the last 168 hours.  Invalid input(s): CK ------------------------------------------------------------------------------------------------------------------ Invalid input(s): [place]  Patient is a 77 year old white female with chronic respiratory failure  #1 sepsis ([place]) - Due to pneumonia Continue IV Zosyn Due to persistently low blood pressure patient is started on Neo-Synephrine Hydrocortisone for adrenal support  #2  HCAP (healthcare-associated pneumonia) -IV antibiotics as above, supportive treatment PRN    #3 hypokalemia potassium being replaced  #4 COPD without evidence of exasperation continue home regimen  #5 leukocytosis due to #1  #6 Lovenox for DVT prophylaxis       Code Status Orders  (From admission, onward)        Start     Ordered   10/08/17 0037  Full code  Continuous     10/08/17 0037    Code Status History    Date Active Date Inactive Code Status Order ID Comments User Context   08/25/2017 14:59 08/27/2017 17:47 Full Code [id number]  [name], MD  ED            Consults  intensivist  DVT Prophylaxis  Lovenox   Lab Results  Component Value Date   PLT 260 10/08/2017     Time Spent in minutes  35 Greater than 50% of time spent in care coordination and counseling patient regarding the condition and plan of care.   Dustin Flock M.D on 10/08/2017 at 4:53 PM  Between 7am to 6pm - Pager - [phone]  After 6pm go to [email or web address] - password [email or web address] [facility]   Office  [phone]

## 2017-10-09 DIAGNOSIS — J189 Pneumonia, unspecified organism: Secondary | ICD-10-CM

## 2017-10-09 LAB — CBC WITH DIFFERENTIAL/PLATELET
Basophils Absolute: 0 10*3/uL (ref 0–0.1)
Basophils Relative: 0 %
Eosinophils Absolute: 0 10*3/uL (ref 0–0.7)
Eosinophils Relative: 0 %
HEMATOCRIT: 28.1 % — AB (ref 35.0–47.0)
HEMOGLOBIN: 9.2 g/dL — AB (ref 12.0–16.0)
LYMPHS ABS: 0.5 10*3/uL — AB (ref 1.0–3.6)
LYMPHS PCT: 4 %
MCH: 31.1 pg (ref 26.0–34.0)
MCHC: 32.9 g/dL (ref 32.0–36.0)
MCV: 94.5 fL (ref 80.0–100.0)
MONOS PCT: 3 %
Monocytes Absolute: 0.4 10*3/uL (ref 0.2–0.9)
NEUTROS ABS: 13.8 10*3/uL — AB (ref 1.4–6.5)
NEUTROS PCT: 93 %
Platelets: 267 10*3/uL (ref 150–440)
RBC: 2.97 MIL/uL — AB (ref 3.80–5.20)
RDW: 13.4 % (ref 11.5–14.5)
WBC: 14.8 10*3/uL — AB (ref 3.6–11.0)

## 2017-10-09 LAB — POTASSIUM
Potassium: 6.8 mmol/L (ref 3.5–5.1)
Potassium: 3.7 mmol/L (ref 3.5–5.1)

## 2017-10-09 LAB — BASIC METABOLIC PANEL
Anion gap: 8 (ref 5–15)
BUN: 9 mg/dL (ref 6–20)
CHLORIDE: 113 mmol/L — AB (ref 101–111)
CO2: 19 mmol/L — AB (ref 22–32)
Calcium: 7.5 mg/dL — ABNORMAL LOW (ref 8.9–10.3)
Creatinine, Ser: 0.74 mg/dL (ref 0.44–1.00)
GFR calc Af Amer: >60 mL/min (ref >60)
GFR calc non Af Amer: >60 mL/min (ref >60)
GLUCOSE: 187 mg/dL — AB (ref 65–99)
POTASSIUM: 3.2 mmol/L — AB (ref 3.5–5.1)
SODIUM: 140 mmol/L (ref 135–145)

## 2017-10-09 LAB — PHOSPHORUS
Phosphorus: 1.5 mg/dL — ABNORMAL LOW (ref 2.5–4.6)
Phosphorus: 10.3 mg/dL — ABNORMAL HIGH (ref 2.5–4.6)
Phosphorus: 2.6 mg/dL (ref 2.5–4.6)

## 2017-10-09 LAB — MAGNESIUM
MAGNESIUM: 1.3 mg/dL — AB (ref 1.7–2.4)
Magnesium: 2.2 mg/dL (ref 1.7–2.4)

## 2017-10-09 LAB — PROCALCITONIN: Procalcitonin: 0.17 ng/mL

## 2017-10-09 LAB — STREP PNEUMONIAE URINARY ANTIGEN: Strep Pneumo Urinary Antigen: NEGATIVE

## 2017-10-09 LAB — LACTIC ACID, PLASMA
LACTIC ACID, VENOUS: 1.7 mmol/L (ref 0.5–1.9)
Lactic Acid, Venous: 1.8 mmol/L (ref 0.5–1.9)

## 2017-10-09 MED ORDER — ENSURE ENLIVE PO LIQD
237.0000 mL | Freq: Two times a day (BID) | ORAL | Status: DC
  Administered 2017-10-10 – 2017-10-12 (×5): 237 mL via ORAL

## 2017-10-09 MED ORDER — POTASSIUM CHLORIDE 10 MEQ/50ML IV SOLN
10.0000 meq | INTRAVENOUS | Status: AC
  Administered 2017-10-09 (×2): 10 meq via INTRAVENOUS
  Filled 2017-10-09 (×2): qty 50

## 2017-10-09 MED ORDER — BUDESONIDE 0.5 MG/2ML IN SUSP
0.5000 mg | Freq: Two times a day (BID) | RESPIRATORY_TRACT | Status: DC
  Administered 2017-10-09 – 2017-10-13 (×8): 0.5 mg via RESPIRATORY_TRACT
  Filled 2017-10-09 (×8): qty 2

## 2017-10-09 MED ORDER — GUAIFENESIN-CODEINE 100-10 MG/5ML PO SOLN
10.0000 mL | Freq: Four times a day (QID) | ORAL | Status: DC | PRN
  Administered 2017-10-09: 10 mL via ORAL
  Filled 2017-10-09: qty 10

## 2017-10-09 MED ORDER — DIPHENHYDRAMINE HCL 25 MG PO CAPS
25.0000 mg | ORAL_CAPSULE | Freq: Four times a day (QID) | ORAL | Status: DC | PRN
  Administered 2017-10-09: 25 mg via ORAL
  Filled 2017-10-09 (×3): qty 1

## 2017-10-09 MED ORDER — INSULIN ASPART 100 UNIT/ML IV SOLN
10.0000 [IU] | Freq: Once | INTRAVENOUS | Status: AC
  Administered 2017-10-09: 10 [IU] via INTRAVENOUS
  Filled 2017-10-09: qty 0.1

## 2017-10-09 MED ORDER — POTASSIUM PHOSPHATES 15 MMOLE/5ML IV SOLN
30.0000 mmol | Freq: Once | INTRAVENOUS | Status: AC
  Administered 2017-10-09: 30 mmol via INTRAVENOUS
  Filled 2017-10-09: qty 10

## 2017-10-09 MED ORDER — SODIUM CHLORIDE 0.9 % IV SOLN
4.0000 g | Freq: Once | INTRAVENOUS | Status: DC
  Filled 2017-10-09: qty 8

## 2017-10-09 MED ORDER — DIPHENHYDRAMINE-ZINC ACETATE 2-0.1 % EX CREA
TOPICAL_CREAM | CUTANEOUS | Status: DC | PRN
  Filled 2017-10-09: qty 28

## 2017-10-09 MED ORDER — SODIUM CHLORIDE 0.9 % IV SOLN
1.0000 g | Freq: Every day | INTRAVENOUS | Status: AC
  Administered 2017-10-09 – 2017-10-12 (×4): 1 g via INTRAVENOUS
  Filled 2017-10-09 (×5): qty 10

## 2017-10-09 MED ORDER — MAGNESIUM SULFATE 4 GM/100ML IV SOLN
4.0000 g | Freq: Once | INTRAVENOUS | Status: AC
  Administered 2017-10-09: 4 g via INTRAVENOUS
  Filled 2017-10-09 (×2): qty 100

## 2017-10-09 MED ORDER — DEXTROSE 50 % IV SOLN
1.0000 | Freq: Once | INTRAVENOUS | Status: AC
  Administered 2017-10-09: 50 mL via INTRAVENOUS
  Filled 2017-10-09: qty 50

## 2017-10-09 NOTE — Progress Notes (Signed)
Pharmacy Electrolyte Monitoring Consult:  Pharmacy consulted to assist in monitoring and replacing electrolytes in this 77 y.o. female admitted on 10/07/2017 with Blood Infection  Patient received potassium 41mEq IV Q1 hr x 2 doses and magnesium 4g IV this am.   Labs:  Sodium (mmol/L)  Date Value  10/09/2017 140  09/12/2012 132 (L)   Potassium (mmol/L)  Date Value  10/09/2017 3.2 (L)  09/11/2012 3.8   Magnesium (mg/dL)  Date Value  10/09/2017 1.3 (L)   Phosphorus (mg/dL)  Date Value  10/09/2017 1.5 (L)   Calcium (mg/dL)  Date Value  10/09/2017 7.5 (L)   Calcium, Total (mg/dL)  Date Value  09/11/2012 8.5   Albumin (g/dL)  Date Value  10/07/2017 4.4    Plan: Will order potassium phosphate 41mmol IV x 1. Will recheck potassium, phosphorus, and magnesium at 2000.   Pharmacy will continue to monitor and adjust per consult.   Simpson,Michael L 10/09/2017 11:42 AM

## 2017-10-09 NOTE — Progress Notes (Signed)
Initial Nutrition Assessment  DOCUMENTATION CODES:   Not applicable  INTERVENTION:   Liberalize diet to regular  Ensure Enlive po BID, each supplement provides 350 kcal and 20 grams of protein    NUTRITION DIAGNOSIS:   Increased nutrient needs related to chronic illness(COPD) as evidenced by estimated needs.   GOAL:   Patient will meet greater than or equal to 90% of their needs   MONITOR:   PO intake  REASON FOR ASSESSMENT:        ASSESSMENT:   77 yo female admitted with sepsis secondary to nosocomial pneumonia in the setting of COPD, hypokalemia PMH HTN., PAD, scoliosis.   Diet was liberalized on rounds.S/w RN confirmed that is increasing PO intake; pt is now eating really well. Pt has now been eating 100% of meals on tray and has also eaten food that pt's daughter brought from [place] [facility].  RN requested deferred NFPE/nutrition assessment interview to allow pt to rest; per RN, pt only had an hour of sleep in past two days and has been acting progressively more "loopy".    Per CSW: Living on social security and sometimes goes to food banks. Possible additional social/environmental factors associated with PO r/t family/living situation.  Medications: PO/injections--hydrocortisone sodium succinate SOLU-CORTEF, NaCl flush  IV-- cefTRIAXone ROCEPHIN, Potassium Phosphate in D5    S/w Pharmacy-- they are monitoring pt labs r/t refeeding.  Labs:  K 3.2 L Cl 113 H CO2 19 L Ca 7.5 L Phos 1.5 L Mg 1.3 L WBC 14.8 H RBC 2.97 L Hbg 9.2 L Hct 28.1 L  Wt in medical record from [place] suggests pt wt decreased s/p spinal surgery requiring PEG, but has remained stable over the past few years. Will confirm wt hx with pt at follow-up since pr BMI is bordering near underwt.  Wt Readings from Last 20 Encounters:  10/08/17 104 lb 8 oz (47.4 kg)  08/25/17 106 lb (48.1 kg)   Wt in 2012 at [facility] 54.4 kg (119 lb 14.4 oz) Wt in 2013 at [facility] 49.4 kg (109 lb)  Diet Order:  Diet  regular Room service appropriate? Yes; Fluid consistency: Thin  EDUCATION NEEDS:   Not appropriate for education at this time  Skin:  Skin Assessment: Reviewed RN Assessment  Last BM:  2/24  Height:   Ht Readings from Last 1 Encounters:  10/08/17 5\' 3"  (1.6 m)    Weight:   Wt Readings from Last 1 Encounters:  10/08/17 104 lb 8 oz (47.4 kg)    Ideal Body Weight:  52.2 kg  BMI:  Body mass index is 18.51 kg/m.  Estimated Nutritional Needs:   Kcal:  1,200-1,400 kcal  Protein:  55-65 grams  Fluid:  >1.2 [facility]    Edmonia Lynch, MS Dietetic Intern Pager: [phone]

## 2017-10-09 NOTE — Progress Notes (Signed)
Pharmacy Antibiotic Note  [facility] is a 77 y.o. female admitted on 10/07/2017 with sepsis.  Pharmacy has been consulted for ceftriaxone dosing. Patient previously ordered Zosyn and vancomycin. Patient is influenza A & B negative. MRSA PCR is also negative.   Plan: Ceftriaxone 1g IV Q24hr.    Height: 5\' 3"  (160 cm) Weight: 104 lb 8 oz (47.4 kg) IBW/kg (Calculated) : 52.4  Temp (24hrs), Avg:98.3 F (36.8 C), Min:98 F (36.7 C), Max:98.6 F (37 C)  Recent Labs  Lab 10/07/17 2113 10/08/17 0151 10/08/17 1442 10/09/17 0426 10/09/17 1043  WBC 17.1* 14.4*  --  14.8*  --   CREATININE 0.95 0.91 0.76 0.74  --   LATICACIDVEN 1.1 2.4* 1.7  --  1.7    Estimated Creatinine Clearance: 44.1 mL/min (by C-G formula based on SCr of 0.74 mg/dL).    Allergies  Allergen Reactions  . Amlodipine Hives  . Azithromycin Hives  . Doxycycline Diarrhea  . Erythromycin Diarrhea  . Hydrocodone Nausea Only  . Lactose Intolerance (Gi)   . Minocycline Diarrhea  . Nsaids Other (See Comments)    Internal Bleeding  . Relafen [Nabumetone] Hives  . Sulfa Antibiotics Hives  . Tetracyclines & Related Diarrhea    Antimicrobials this admission: Vancomycin 2/23 >> Zosyn 2/23 >>   Dose adjustments this admission: N/A  Microbiology results: 2/23 BCx: no growth x 2 days  2/24 MRSA PCR: negative 2/23 Influenza A & B: negative  2/25 Procalcitonin: pending  2/23 CXR: No evidence of acute cardiopulmonary disease Thank you for allowing pharmacy to be a part of this patient's care.  Simpson,Michael L 10/09/2017 11:36 AM

## 2017-10-09 NOTE — Progress Notes (Signed)
[place] at [facility]                                                                                                                                                                                  Patient Demographics   [facility], is a 77 y.o. female, DOB - [birth year], TGY:[id number]  Admit date - 10/07/2017   Admitting Physician Lance Coon, MD  Outpatient Primary MD for the patient is Patient, No Pcp Per   LOS - 2  Subjective: Patient's currently on Levophed drip due to low blood pressure Shortness of breath and cough improved  Review of Systems:   CONSTITUTIONAL: No documented fever. No fatigue, weakness. No weight gain, no weight loss.  EYES: No blurry or double vision.  ENT: No tinnitus. No postnasal drip. No redness of the oropharynx.  RESPIRATORY positive cough, no wheeze, no hemoptysis.  Positive dyspnea.  CARDIOVASCULAR: No chest pain. No orthopnea. No palpitations. No syncope.  GASTROINTESTINAL: No nausea, no vomiting or diarrhea. No abdominal pain. No melena or hematochezia.  GENITOURINARY: No dysuria or hematuria.  ENDOCRINE: No polyuria or nocturia. No heat or cold intolerance.  HEMATOLOGY: No anemia. No bruising. No bleeding.  INTEGUMENTARY: No rashes. No lesions.  MUSCULOSKELETAL: No arthritis. No swelling. No gout.  NEUROLOGIC: No numbness, tingling, or ataxia. No seizure-type activity.  PSYCHIATRIC: No anxiety. No insomnia. No ADD.    Vitals:   Vitals:   10/09/17 1130 10/09/17 1145 10/09/17 1200 10/09/17 1215  BP: (!) 92/58 (!) 79/66 (!) 93/59 108/66  Pulse: (!) 116 (!) 120 (!) 116 (!) 119  Resp: 17 18 19  (!) 21  Temp:   98.2 F (36.8 C)   TempSrc:   Oral   SpO2: 94% 93% 95% 97%  Weight:      Height:        Wt Readings from Last 3 Encounters:  10/08/17 104 lb 8 oz (47.4 kg)  08/25/17 106 lb (48.1 kg)     Intake/Output Summary (Last 24 hours) at 10/09/2017 1433 Last data filed at 10/09/2017 0800 Gross  per 24 hour  Intake 1135.73 ml  Output 1200 ml  Net -64.27 ml    Physical Exam:   GENERAL: Pleasant-appearing in no apparent distress.  HEAD, EYES, EARS, NOSE AND THROAT: Atraumatic, normocephalic. Extraocular muscles are intact. Pupils equal and reactive to light. Sclerae anicteric. No conjunctival injection. No oro-pharyngeal erythema.  NECK: Supple. There is no jugular venous distention. No bruits, no lymphadenopathy, no thyromegaly.  HEART: Regular rate and rhythm,. No murmurs, no rubs, no clicks.  LUNGS: Bilateral wheezes throughout both lung ABDOMEN: Soft, flat, nontender, nondistended. Has good bowel sounds. No hepatosplenomegaly appreciated.  EXTREMITIES: No evidence of any cyanosis, clubbing, or peripheral edema.  +2 pedal and radial pulses bilaterally.  NEUROLOGIC: The patient is alert, awake, and oriented x3 with no focal motor or sensory deficits appreciated bilaterally.  SKIN: Moist and warm with no rashes appreciated.  Psych: Not anxious, depressed LN: No inguinal LN enlargement    Antibiotics   Anti-infectives (From admission, onward)   Start     Dose/Rate Route Frequency Ordered Stop   10/09/17 1145  cefTRIAXone (ROCEPHIN) 1 g in sodium chloride 0.9 % 100 mL IVPB     1 g 200 mL/hr over 30 Minutes Intravenous Daily 10/09/17 1135     10/08/17 2100  vancomycin (VANCOCIN) IVPB 750 mg/150 ml premix  Status:  Discontinued     750 mg 150 mL/hr over 60 Minutes Intravenous Every 24 hours 10/07/17 2350 10/08/17 0734   10/08/17 0400  piperacillin-tazobactam (ZOSYN) IVPB 3.375 g  Status:  Discontinued     3.375 g 12.5 mL/hr over 240 Minutes Intravenous Every 8 hours 10/07/17 2350 10/09/17 1039   10/07/17 2130  piperacillin-tazobactam (ZOSYN) IVPB 3.375 g     3.375 g 100 mL/hr over 30 Minutes Intravenous  Once 10/07/17 2128 10/07/17 2153   10/07/17 2130  vancomycin (VANCOCIN) IVPB 1000 mg/200 mL premix     1,000 mg 200 mL/hr over 60 Minutes Intravenous  Once 10/07/17 2128  10/07/17 2235      Medications   Scheduled Meds: . benzonatate  100 mg Oral TID  . budesonide (PULMICORT) nebulizer solution  0.5 mg Nebulization BID  . cilostazol  100 mg Oral BID  . clonazePAM  0.5 mg Oral QHS  . enoxaparin (LOVENOX) injection  40 mg Subcutaneous Q24H  . gabapentin  100 mg Oral BID  . hydrocortisone sod succinate (SOLU-CORTEF) inj  50 mg Intravenous Q6H  . ipratropium-albuterol  3 mL Nebulization Q4H  . sodium chloride flush  10-40 mL Intracatheter Q12H   Continuous Infusions: . cefTRIAXone (ROCEPHIN)  IV 1 g (10/09/17 1218)  . phenylephrine (NEO-SYNEPHRINE) Adult infusion 10 mcg/min (10/09/17 1229)  . potassium PHOSPHATE IVPB (mmol) 30 mmol (10/09/17 1218)   PRN Meds:.acetaminophen **OR** acetaminophen, guaiFENesin-codeine, lip balm, ondansetron **OR** ondansetron (ZOFRAN) IV, oxyCODONE, sodium chloride flush   Data Review:   Micro Results Recent Results (from the past 240 hour(s))  Culture, blood (Routine x 2)     Status: None (Preliminary result)   Collection Time: 10/07/17  9:10 PM  Result Value Ref Range Status   Specimen Description BLOOD RIGHT ANTECUBITAL  Final   Special Requests   Final    BOTTLES DRAWN AEROBIC AND ANAEROBIC Blood Culture results may not be optimal due to an excessive volume of blood received in culture bottles   Culture   Final    NO GROWTH 2 DAYS Performed at [facility], [address] [place], [place] [address]    Report Status PENDING  Incomplete  Culture, blood (Routine x 2)     Status: None (Preliminary result)   Collection Time: 10/07/17  9:13 PM  Result Value Ref Range Status   Specimen Description BLOOD LEFT ANTECUBITAL  Final   Special Requests   Final    BOTTLES DRAWN AEROBIC AND ANAEROBIC Blood Culture adequate volume   Culture   Final    NO GROWTH 2 DAYS Performed at [facility], [address] [place], [place] [address]    Report Status PENDING  Incomplete  MRSA PCR Screening      Status: None  Collection Time: 10/08/17  4:12 AM  Result Value Ref Range Status   MRSA by PCR NEGATIVE NEGATIVE Final    Comment:        The GeneXpert MRSA Assay (FDA approved for NASAL specimens only), is one component of a comprehensive MRSA colonization surveillance program. It is not intended to diagnose MRSA infection nor to guide or monitor treatment for MRSA infections. Performed at [facility], [address], [place],  [address]     Radiology Reports Dg Chest [place] 1 View  Result Date: 10/07/2017 CLINICAL DATA:  Fever and shortness of breath for 2 days. EXAM: PORTABLE CHEST 1 VIEW COMPARISON:  08/25/2017 and prior exams FINDINGS: Cardiomediastinal silhouette is unchanged. Interstitial prominence is again noted. No new pulmonary opacities are identified. A possible trace RIGHT pleural effusion again noted. There is no evidence of pneumothorax or acute bony abnormality. IMPRESSION: 1. No evidence of acute cardiopulmonary disease 2. Stable pulmonary opacities and interstitial prominence with possible trace RIGHT pleural effusion. Electronically Signed   By: Margarette Canada M.D.   On: 10/07/2017 21:53     CBC Recent Labs  Lab 10/07/17 2113 10/08/17 0151 10/09/17 0426  WBC 17.1* 14.4* 14.8*  HGB 13.9 10.1* 9.2*  HCT 41.9 31.0* 28.1*  PLT 345 260 267  MCV 94.0 94.8 94.5  MCH 31.2 31.0 31.1  MCHC 33.2 32.7 32.9  RDW 13.1 13.2 13.4  LYMPHSABS 1.4  --  0.5*  MONOABS 1.1*  --  0.4  EOSABS 0.0  --  0.0  BASOSABS 0.0  --  0.0    Chemistries  Recent Labs  Lab 10/07/17 2113 10/08/17 0151 10/08/17 1442 10/09/17 0426  NA 135 135 137 140  K 3.3* 2.8* 3.5 3.2*  CL 99* 105 110 113*  CO2 24 21* 18* 19*  GLUCOSE 133* 185* 252* 187*  BUN 18 17 12 9   CREATININE 0.95 0.91 0.76 0.74  CALCIUM 9.2 7.5* 6.9* 7.5*  MG  --   --   --  1.3*  AST 21  --   --   --   ALT 14  --   --   --   ALKPHOS 67  --   --   --   BILITOT 1.5*  --   --   --     ------------------------------------------------------------------------------------------------------------------ estimated creatinine clearance is 44.1 mL/min (by C-G formula based on SCr of 0.74 mg/dL). ------------------------------------------------------------------------------------------------------------------ No results for input(s): HGBA1C in the last 72 hours. ------------------------------------------------------------------------------------------------------------------ No results for input(s): CHOL, HDL, LDLCALC, TRIG, CHOLHDL, LDLDIRECT in the last 72 hours. ------------------------------------------------------------------------------------------------------------------ No results for input(s): TSH, T4TOTAL, T3FREE, THYROIDAB in the last 72 hours.  Invalid input(s): FREET3 ------------------------------------------------------------------------------------------------------------------ No results for input(s): VITAMINB12, FOLATE, FERRITIN, TIBC, IRON, RETICCTPCT in the last 72 hours.  Coagulation profile Recent Labs  Lab 10/07/17 2113  INR 1.07    No results for input(s): DDIMER in the last 72 hours.  Cardiac Enzymes No results for input(s): CKMB, TROPONINI, MYOGLOBIN in the last 168 hours.  Invalid input(s): CK ------------------------------------------------------------------------------------------------------------------ Invalid input(s): [place]  Patient is a 77 year old white female with chronic respiratory failure  #1 sepsis ([place]) - Due to pneumonia Continue IV Zosyn Due to persistently low blood pressure patient is started on Neo-Synephrine Hydrocortisone for adrenal support Need to consider adrenal insufficiency as a cause for the hypotension add cortisol levels  #2  HCAP (healthcare-associated pneumonia) -IV antibiotics as above, supportive treatment PRN    #3 hypokalemia potassium being replaced  #  4 COPD without  evidence of exasperation continue home regimen  #5 leukocytosis due to #1  #6 Lovenox for DVT prophylaxis       Code Status Orders  (From admission, onward)        Start     Ordered   10/08/17 0037  Full code  Continuous     10/08/17 0037    Code Status History    Date Active Date Inactive Code Status Order ID Comments User Context   08/25/2017 14:59 08/27/2017 17:47 Full Code [id number]  [name], MD ED           Consults  intensivist  DVT Prophylaxis  Lovenox   Lab Results  Component Value Date   PLT 267 10/09/2017     Time Spent in minutes  35 Greater than 50% of time spent in care coordination and counseling patient regarding the condition and plan of care.   Dustin Flock M.D on 10/09/2017 at 2:33 PM  Between 7am to 6pm - Pager - [phone]  After 6pm go to [email or web address] - password [email or web address] [facility]   Office  [phone]

## 2017-10-09 NOTE — Progress Notes (Signed)
   10/09/17 1335  Clinical Encounter Type  Visited With Patient  Visit Type Initial  Spiritual Encounters  Spiritual Needs Emotional  Stress Factors  Patient Stress Factors Financial concerns;Family relationships   Chaplain met with patient.  Patient shared thoughts and feelings regarding family dynamics and recent events.  Patient engaged in life review around important relationships in her life.  Discussion around resources available to patient, what she is still able to access and what is no longer available to her.  Exploration of spiritual resources and supports; patient openly engaged in naming that which nurtures her spiritual life.  Patient open to ongoing chaplain support.

## 2017-10-09 NOTE — Consult Note (Signed)
PULMONARY / CRITICAL CARE MEDICINE   Name: Savannah Mcclure MRN: [id number] DOB: 02-03-1941    ADMISSION DATE:  10/07/2017   CONSULTATION DATE:  10/08/2017  REFERRING MD:  Dr Marcille Blanco  REASON: Hypotension  HISTORY OF PRESENT ILLNESS:   This is a 77 year old Caucasian female with a past medical history as indicated below, current smoker, recent hospitalization for sepsis, pneumonia and COPD exacerbation who presents with fever and shortness of breath times 2 days.  Patient states that she never felt fully recovered post discharge.  She reports persistent fever, a productive cough, and generalized weakness.  Her ED workup revealed persistent pneumonia on chest x-ray and small pleural effusions.  She was admitted to the floor for management but upon arrival on the floor she became hypotensive.  She was given fluid boluses without any significant improvement in her blood pressure hence she was transferred to the ICU for further management.  I mean arterial blood pressure remains in the low 60s.  She reports being around sick contacts specifically her grandson who had an upper respiratory tract infection but no influenza  SUBJECTIVE Alert and awake this AM Remains on vasopressors last night Remains SD status      REVIEW OF SYSTEMS:   Constitutional: positive for fever and chills.  HENT: Negative for congestion and rhinorrhea.  Eyes: Negative for redness and visual disturbance.  Respiratory: positive for shortness of breath and wheezing.  Cardiovascular: Negative for chest pain and palpitations.  Gastrointestinal: Negative  for nausea , vomiting and abdominal pain and  Loose stools positive for anorexia Genitourinary: Negative for dysuria and urgency.  Endocrine: Denies polyuria, polyphagia and heat intolerance Musculoskeletal: Negative for myalgias and arthralgias but positive for generalized weakness.  Skin: Negative for pallor and wound.  Neurological: Negative for dizziness  and headaches    VITAL SIGNS: BP 117/81   Pulse (!) 114   Temp 98 F (36.7 C) (Oral)   Resp (!) 21   Ht 5\' 3"  (1.6 m)   Wt 104 lb 8 oz (47.4 kg)   SpO2 94%   BMI 18.51 kg/m   HEMODYNAMICS:    VENTILATOR SETTINGS: FiO2 (%):  [28 %] 28 %  INTAKE / OUTPUT: I/O last 3 completed shifts: In: 3907.4 [P.O.:480; I.V.:760.7; IV Piggyback:2666.7] Out: 1850 [Urine:1850]  PHYSICAL EXAMINATION: General: Acutely ill looking Neuro: Alert and oriented x3, cranial nerves intact HEENT: PERRLA Cardiovascular: Tachycardic, regular, S1-S2, no murmur regurg or gallop Lungs: Increased work of breathing, diffuse rhonchi and wheezes in all lung fields, bibasilar Rales Abdomen: Nondistended, normal bowel sounds Musculoskeletal: No deformities Skin: Warm and dry  CULTURES: Blood cultures x2  ANTIBIOTICS: Vancomycin-discontinued Zosyn  SIGNIFICANT EVENTS: 01/13>discharged 02/23>readmitted  LINES/TUBES: PIVs  DISCUSSION: 77 year old female presenting with recurrent pneumonia, sepsis secondary to pneumonia, septic shock and acute COPD exacerbation.  Rapid influenza negative  ASSESSMENT Septic shock Sepsis secondary to pneumonia Acute COPD exacerbation Healthcare acquired pneumonia Current smoker History of hypertension  PLAN Supplemental oxygen to maintain SPO2 greater than 90 Antibiotics as above IV fluids and pressors to maintain mean arterial blood pressure greater than 65 Follow-up cultures Inhaled bronchodilators and steroids IV steroids Pulmonary hygiene Flutter valve and incentive spirometer GI and DVT prophylaxis  Remains SD status  Maretta Bees Patricia Pesa, M.D.  [facility]  Medical Director [place] Director [facility] Cardio-Pulmonary Department
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
## 2017-10-10 ENCOUNTER — Ambulatory Visit: Payer: TRICARE For Life (TFL) | Admitting: Unknown Physician Specialty

## 2017-10-10 LAB — BASIC METABOLIC PANEL
ANION GAP: 6 (ref 5–15)
BUN: 10 mg/dL (ref 6–20)
CHLORIDE: 111 mmol/L (ref 101–111)
CO2: 23 mmol/L (ref 22–32)
Calcium: 7.9 mg/dL — ABNORMAL LOW (ref 8.9–10.3)
Creatinine, Ser: 0.64 mg/dL (ref 0.44–1.00)
GFR calc non Af Amer: >60 mL/min (ref >60)
GLUCOSE: 136 mg/dL — AB (ref 65–99)
POTASSIUM: 3.9 mmol/L (ref 3.5–5.1)
Sodium: 140 mmol/L (ref 135–145)

## 2017-10-10 LAB — PHOSPHORUS: PHOSPHORUS: 2.7 mg/dL (ref 2.5–4.6)

## 2017-10-10 LAB — URINE CULTURE: CULTURE: NO GROWTH

## 2017-10-10 LAB — GLUCOSE, CAPILLARY: Glucose-Capillary: 119 mg/dL — ABNORMAL HIGH (ref 65–99)

## 2017-10-10 LAB — CORTISOL: CORTISOL PLASMA: 73.4 ug/dL

## 2017-10-10 LAB — LEGIONELLA PNEUMOPHILA SEROGP 1 UR AG: L. PNEUMOPHILA SEROGP 1 UR AG: NEGATIVE

## 2017-10-10 LAB — MAGNESIUM: Magnesium: 2 mg/dL (ref 1.7–2.4)

## 2017-10-10 LAB — PROCALCITONIN: Procalcitonin: 0.21 ng/mL

## 2017-10-10 MED ORDER — PREDNISONE 20 MG PO TABS
20.0000 mg | ORAL_TABLET | Freq: Every day | ORAL | Status: DC
  Administered 2017-10-11 – 2017-10-13 (×3): 20 mg via ORAL
  Filled 2017-10-10: qty 2
  Filled 2017-10-10 (×2): qty 1

## 2017-10-10 MED ORDER — DIGOXIN 0.25 MG/ML IJ SOLN
0.1250 mg | INTRAMUSCULAR | Status: DC
  Filled 2017-10-10: qty 0.5

## 2017-10-10 MED ORDER — METOPROLOL TARTRATE 5 MG/5ML IV SOLN
2.5000 mg | Freq: Four times a day (QID) | INTRAVENOUS | Status: DC | PRN
  Filled 2017-10-10: qty 5

## 2017-10-10 NOTE — Progress Notes (Signed)
Pharmacy Antibiotic Note  Savannah Mcclure is a 77 y.o. female admitted on 10/07/2017 with sepsis.  Pharmacy has been consulted for ceftriaxone dosing. Patient previously ordered Zosyn and vancomycin. Patient is influenza A & B negative. MRSA PCR is also negative.   Plan: Ceftriaxone 1g IV Q24hr.    Height: 5\' 3"  (160 cm) Weight: 104 lb 8 oz (47.4 kg) IBW/kg (Calculated) : 52.4  Temp (24hrs), Avg:98.3 F (36.8 C), Min:97.8 F (36.6 C), Max:98.7 F (37.1 C)  Recent Labs  Lab 10/07/17 2113 10/08/17 0151 10/08/17 1442 10/09/17 0426 10/09/17 1043 10/09/17 1408 10/10/17 0525  WBC 17.1* 14.4*  --  14.8*  --   --   --   CREATININE 0.95 0.91 0.76 0.74  --   --  0.64  LATICACIDVEN 1.1 2.4* 1.7  --  1.7 1.8  --     Estimated Creatinine Clearance: 44.1 mL/min (by C-G formula based on SCr of 0.64 mg/dL).    Allergies  Allergen Reactions  . Amlodipine Hives  . Azithromycin Hives  . Doxycycline Diarrhea  . Erythromycin Diarrhea  . Hydrocodone Nausea Only  . Lactose Intolerance (Gi)   . Minocycline Diarrhea  . Nsaids Other (See Comments)    Internal Bleeding  . Relafen [Nabumetone] Hives  . Sulfa Antibiotics Hives  . Tetracyclines & Related Diarrhea    Antimicrobials this admission: Vancomycin 2/23 >> 2/25 Zosyn 2/23 >> 2/25 Ceftriaxone 2/25 >> 2/28  Dose adjustments this admission: N/A  Microbiology results: 2/23 BCx: no growth x 3 days  2/24 MRSA PCR: negative 2/23 Influenza A & B: negative  2/24 UCx: no growth  2/25 Procalcitonin: 0.21  2/23 CXR: No evidence of acute cardiopulmonary disease Thank you for allowing pharmacy to be a part of this patient's care.  Daxtyn Rottenberg L 10/10/2017 11:10 PM

## 2017-10-10 NOTE — Progress Notes (Signed)
[place] at [facility]                                                                                                                                                                                  Patient Demographics   [facility], is a 77 y.o. female, DOB - 1941-01-20, ZOX:[id number]  Admit date - 10/07/2017   Admitting Physician Lance Coon, MD  Outpatient Primary MD for the patient is Patient, No Pcp Per   LOS - 3  Subjective: Blood pressure improved off Levophed drip now SOB of breath improved  Review of Systems:   CONSTITUTIONAL: No documented fever. No fatigue, weakness. No weight gain, no weight loss.  EYES: No blurry or double vision.  ENT: No tinnitus. No postnasal drip. No redness of the oropharynx.  RESPIRATORY positive cough, no wheeze, no hemoptysis.  Positive dyspnea.  CARDIOVASCULAR: No chest pain. No orthopnea. No palpitations. No syncope.  GASTROINTESTINAL: No nausea, no vomiting or diarrhea. No abdominal pain. No melena or hematochezia.  GENITOURINARY: No dysuria or hematuria.  ENDOCRINE: No polyuria or nocturia. No heat or cold intolerance.  HEMATOLOGY: No anemia. No bruising. No bleeding.  INTEGUMENTARY: No rashes. No lesions.  MUSCULOSKELETAL: No arthritis. No swelling. No gout.  NEUROLOGIC: No numbness, tingling, or ataxia. No seizure-type activity.  PSYCHIATRIC: No anxiety. No insomnia. No ADD.    Vitals:   Vitals:   10/10/17 0800 10/10/17 1000 10/10/17 1200 10/10/17 1400  BP: 98/61 109/74 (!) 157/88 104/65  Pulse: 84 (!) 119 (!) 118 (!) 126  Resp: 19 (!) 26 (!) 24 16  Temp: 97.8 F (36.6 C)     TempSrc: Oral     SpO2: 97% 96% 96% 93%  Weight:      Height:        Wt Readings from Last 3 Encounters:  10/08/17 104 lb 8 oz (47.4 kg)  08/25/17 106 lb (48.1 kg)     Intake/Output Summary (Last 24 hours) at 10/10/2017 1531 Last data filed at 10/10/2017 1220 Gross per 24 hour  Intake 1320.13 ml  Output  1425 ml  Net -104.87 ml    Physical Exam:   GENERAL: Pleasant-appearing in no apparent distress.  HEAD, EYES, EARS, NOSE AND THROAT: Atraumatic, normocephalic. Extraocular muscles are intact. Pupils equal and reactive to light. Sclerae anicteric. No conjunctival injection. No oro-pharyngeal erythema.  NECK: Supple. There is no jugular venous distention. No bruits, no lymphadenopathy, no thyromegaly.  HEART: Regular rate and rhythm,. No murmurs, no rubs, no clicks.  LUNGS: Bilateral wheezes throughout both lung ABDOMEN: Soft, flat, nontender, nondistended. Has good bowel sounds. No hepatosplenomegaly appreciated.  EXTREMITIES: No evidence of any cyanosis, clubbing,  or peripheral edema.  +2 pedal and radial pulses bilaterally.  NEUROLOGIC: The patient is alert, awake, and oriented x3 with no focal motor or sensory deficits appreciated bilaterally.  SKIN: Moist and warm with no rashes appreciated.  Psych: Not anxious, depressed LN: No inguinal LN enlargement    Antibiotics   Anti-infectives (From admission, onward)   Start     Dose/Rate Route Frequency Ordered Stop   10/09/17 1145  cefTRIAXone (ROCEPHIN) 1 g in sodium chloride 0.9 % 100 mL IVPB     1 g 200 mL/hr over 30 Minutes Intravenous Daily 10/09/17 1135 10/12/17 2359   10/08/17 2100  vancomycin (VANCOCIN) IVPB 750 mg/150 ml premix  Status:  Discontinued     750 mg 150 mL/hr over 60 Minutes Intravenous Every 24 hours 10/07/17 2350 10/08/17 0734   10/08/17 0400  piperacillin-tazobactam (ZOSYN) IVPB 3.375 g  Status:  Discontinued     3.375 g 12.5 mL/hr over 240 Minutes Intravenous Every 8 hours 10/07/17 2350 10/09/17 1039   10/07/17 2130  piperacillin-tazobactam (ZOSYN) IVPB 3.375 g     3.375 g 100 mL/hr over 30 Minutes Intravenous  Once 10/07/17 2128 10/07/17 2153   10/07/17 2130  vancomycin (VANCOCIN) IVPB 1000 mg/200 mL premix     1,000 mg 200 mL/hr over 60 Minutes Intravenous  Once 10/07/17 2128 10/07/17 2235       Medications   Scheduled Meds: . benzonatate  100 mg Oral TID  . budesonide (PULMICORT) nebulizer solution  0.5 mg Nebulization BID  . cilostazol  100 mg Oral BID  . clonazePAM  0.5 mg Oral QHS  . enoxaparin (LOVENOX) injection  40 mg Subcutaneous Q24H  . feeding supplement (ENSURE ENLIVE)  237 mL Oral BID BM  . gabapentin  100 mg Oral BID  . ipratropium-albuterol  3 mL Nebulization Q4H  . [START ON 10/11/2017] predniSONE  20 mg Oral Q breakfast  . sodium chloride flush  10-40 mL Intracatheter Q12H   Continuous Infusions: . cefTRIAXone (ROCEPHIN)  IV Stopped (10/10/17 1000)  . diphenhydrAMINE-zinc acetate     PRN Meds:.acetaminophen **OR** acetaminophen, diphenhydrAMINE, diphenhydrAMINE-zinc acetate, guaiFENesin-codeine, lip balm, ondansetron **OR** ondansetron (ZOFRAN) IV, oxyCODONE, sodium chloride flush   Data Review:   Micro Results Recent Results (from the past 240 hour(s))  Culture, blood (Routine x 2)     Status: None (Preliminary result)   Collection Time: 10/07/17  9:10 PM  Result Value Ref Range Status   Specimen Description BLOOD RIGHT ANTECUBITAL  Final   Special Requests   Final    BOTTLES DRAWN AEROBIC AND ANAEROBIC Blood Culture results may not be optimal due to an excessive volume of blood received in culture bottles   Culture   Final    NO GROWTH 3 DAYS Performed at [facility], [address] [place], [place] [address]    Report Status PENDING  Incomplete  Culture, blood (Routine x 2)     Status: None (Preliminary result)   Collection Time: 10/07/17  9:13 PM  Result Value Ref Range Status   Specimen Description BLOOD LEFT ANTECUBITAL  Final   Special Requests   Final    BOTTLES DRAWN AEROBIC AND ANAEROBIC Blood Culture adequate volume   Culture   Final    NO GROWTH 3 DAYS Performed at [facility], [address], [place], [place] [address]    Report Status PENDING  Incomplete  MRSA PCR Screening     Status: None   Collection  Time: 10/08/17  4:12 AM  Result Value Ref Range Status   MRSA by PCR NEGATIVE NEGATIVE Final    Comment:        The GeneXpert MRSA Assay (FDA approved for NASAL specimens only), is one component of a comprehensive MRSA colonization surveillance program. It is not intended to diagnose MRSA infection nor to guide or monitor treatment for MRSA infections. Performed at [facility], [address], [place], [place] [address]   Urine Culture     Status: None   Collection Time: 10/08/17 11:37 AM  Result Value Ref Range Status   Specimen Description   Final    URINE, CATHETERIZED Performed at [facility], [address], [place], [place] [address]    Special Requests   Final    NONE Performed at [facility] [address], [place], [place] [address]    Culture   Final    NO GROWTH Performed at [facility], [place] [address] [place], [place] [address]    Report Status 10/10/2017 FINAL  Final    Radiology Reports Dg Chest Port 1 View  Result Date: 10/07/2017 CLINICAL DATA:  Fever and shortness of breath for 2 days. EXAM: PORTABLE CHEST 1 VIEW COMPARISON:  08/25/2017 and prior exams FINDINGS: Cardiomediastinal silhouette is unchanged. Interstitial prominence is again noted. No new pulmonary opacities are identified. A possible trace RIGHT pleural effusion again noted. There is no evidence of pneumothorax or acute bony abnormality. IMPRESSION: 1. No evidence of acute cardiopulmonary disease 2. Stable pulmonary opacities and interstitial prominence with possible trace RIGHT pleural effusion. Electronically Signed   By: Margarette Canada M.D.   On: 10/07/2017 21:53     CBC Recent Labs  Lab 10/07/17 2113 10/08/17 0151 10/09/17 0426  WBC 17.1* 14.4* 14.8*  HGB 13.9 10.1* 9.2*  HCT 41.9 31.0* 28.1*  PLT 345 260 267  MCV 94.0 94.8 94.5  MCH 31.2 31.0 31.1  MCHC 33.2 32.7 32.9  RDW 13.1 13.2 13.4  LYMPHSABS 1.4  --  0.5*  MONOABS 1.1*  --  0.4   EOSABS 0.0  --  0.0  BASOSABS 0.0  --  0.0    Chemistries  Recent Labs  Lab 10/07/17 2113 10/08/17 0151 10/08/17 1442 10/09/17 0426 10/09/17 1928 10/09/17 2327 10/10/17 0525  NA 135 135 137 140  --   --  140  K 3.3* 2.8* 3.5 3.2* 6.8* 3.7 3.9  CL 99* 105 110 113*  --   --  111  CO2 24 21* 18* 19*  --   --  23  GLUCOSE 133* 185* 252* 187*  --   --  136*  BUN 18 17 12 9   --   --  10  CREATININE 0.95 0.91 0.76 0.74  --   --  0.64  CALCIUM 9.2 7.5* 6.9* 7.5*  --   --  7.9*  MG  --   --   --  1.3* 2.2  --  2.0  AST 21  --   --   --   --   --   --   ALT 14  --   --   --   --   --   --   ALKPHOS 67  --   --   --   --   --   --   BILITOT 1.5*  --   --   --   --   --   --    ------------------------------------------------------------------------------------------------------------------ estimated creatinine clearance is 44.1 mL/min (  by C-G formula based on SCr of 0.64 mg/dL). ------------------------------------------------------------------------------------------------------------------ No results for input(s): HGBA1C in the last 72 hours. ------------------------------------------------------------------------------------------------------------------ No results for input(s): CHOL, HDL, LDLCALC, TRIG, CHOLHDL, LDLDIRECT in the last 72 hours. ------------------------------------------------------------------------------------------------------------------ No results for input(s): TSH, T4TOTAL, T3FREE, THYROIDAB in the last 72 hours.  Invalid input(s): FREET3 ------------------------------------------------------------------------------------------------------------------ No results for input(s): VITAMINB12, FOLATE, FERRITIN, TIBC, IRON, RETICCTPCT in the last 72 hours.  Coagulation profile Recent Labs  Lab 10/07/17 2113  INR 1.07    No results for input(s): DDIMER in the last 72 hours.  Cardiac Enzymes No results for input(s): CKMB, TROPONINI, MYOGLOBIN in the last 168  hours.  Invalid input(s): CK ------------------------------------------------------------------------------------------------------------------ Invalid input(s): [place]  Patient is a 77 year old white female with chronic respiratory failure  #1 sepsis ([place]) - Due to pneumonia Continue IV Zosyn Hr stable Hydrocortisone for adrenal support Need to consider adrenal insufficiency as a cause for the hypotension add cortisol levels  #2  HCAP (healthcare-associated pneumonia) -IV antibiotics as per surgery    #3 hypokalemia potassium being replaced  #4 COPD without evidence of exasperation continue home regimen  #5 leukocytosis due to #1  #6 Lovenox for DVT prophylaxis       Code Status Orders  (From admission, onward)        Start     Ordered   10/08/17 0037  Full code  Continuous     10/08/17 0037    Code Status History    Date Active Date Inactive Code Status Order ID Comments User Context   08/25/2017 14:59 08/27/2017 17:47 Full Code [id number]  [name], MD ED           Consults  intensivist  DVT Prophylaxis  Lovenox   Lab Results  Component Value Date   PLT 267 10/09/2017     Time Spent in minutes  35 Greater than 50% of time spent in care coordination and counseling patient regarding the condition and plan of care.   Dustin Flock M.D on 10/10/2017 at 3:31 PM  Between 7am to 6pm - Pager - [phone]  After 6pm go to [email or web address] - password [email or web address] [place] [facility]   Office  [phone]

## 2017-10-10 NOTE — Progress Notes (Signed)
Patient's heart rate sustaining in 130's. Patient is refusing to take HR control medication at this time. Education was provide for patient. Will continue to monitor patient.

## 2017-10-10 NOTE — Progress Notes (Signed)
Pharmacy Electrolyte Monitoring Consult:  Pharmacy consulted to assist in monitoring and replacing electrolytes in this 77 y.o. female admitted on 10/07/2017 with Blood Infection   Labs:  Sodium (mmol/L)  Date Value  10/10/2017 140  09/12/2012 132 (L)   Potassium (mmol/L)  Date Value  10/10/2017 3.9  09/11/2012 3.8   Magnesium (mg/dL)  Date Value  10/10/2017 2.0   Phosphorus (mg/dL)  Date Value  10/10/2017 2.7   Calcium (mg/dL)  Date Value  10/10/2017 7.9 (L)   Calcium, Total (mg/dL)  Date Value  09/11/2012 8.5   Albumin (g/dL)  Date Value  10/07/2017 4.4    Plan: No replacement warranted at this time.   Pharmacy will continue to monitor and adjust per consult.   Axton Cihlar L 10/10/2017 11:09 PM

## 2017-10-10 NOTE — Progress Notes (Signed)
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   RD and Dietetic Intern met with patient after rounds. Discussed that Ensure Enlive does not contain any lactose. Offered to switch to [employer] but patient refuses to switch to [employer] saying "it will ruin juice for her." Now that she knows Ensure Enlive does not have lactose, she wants to continue with this supplement. Patient reports she is worried she will gain weight. Discussed she has very high calorie and protein needs and the ONS is meant to help her meet those needs.  Willey Blade, MS, [place], LDN Office: [phone] Pager: [phone] After Hours/Weekend Pager: [phone]

## 2017-10-11 LAB — BASIC METABOLIC PANEL
Anion gap: 8 (ref 5–15)
BUN: 12 mg/dL (ref 6–20)
CALCIUM: 7.8 mg/dL — AB (ref 8.9–10.3)
CO2: 24 mmol/L (ref 22–32)
CREATININE: 0.69 mg/dL (ref 0.44–1.00)
Chloride: 107 mmol/L (ref 101–111)
Glucose, Bld: 86 mg/dL (ref 65–99)
Potassium: 3.2 mmol/L — ABNORMAL LOW (ref 3.5–5.1)
SODIUM: 139 mmol/L (ref 135–145)

## 2017-10-11 LAB — PHOSPHORUS: PHOSPHORUS: 2.4 mg/dL — AB (ref 2.5–4.6)

## 2017-10-11 LAB — PROCALCITONIN: PROCALCITONIN: 0.16 ng/mL

## 2017-10-11 LAB — MAGNESIUM: MAGNESIUM: 1.7 mg/dL (ref 1.7–2.4)

## 2017-10-11 MED ORDER — POTASSIUM CHLORIDE 20 MEQ PO PACK: 40.0000 meq | PACK | ORAL | Status: DC

## 2017-10-11 MED ORDER — ALPRAZOLAM 0.5 MG PO TABS
0.5000 mg | ORAL_TABLET | Freq: Three times a day (TID) | ORAL | Status: DC | PRN
  Administered 2017-10-11: 0.5 mg via ORAL
  Filled 2017-10-11: qty 1

## 2017-10-11 MED ORDER — MORPHINE SULFATE (PF) 2 MG/ML IV SOLN
2.0000 mg | Freq: Once | INTRAVENOUS | Status: AC
  Administered 2017-10-11: 2 mg via INTRAVENOUS
  Filled 2017-10-11: qty 1

## 2017-10-11 MED ORDER — MAGNESIUM SULFATE 2 GM/50ML IV SOLN
2.0000 g | Freq: Once | INTRAVENOUS | Status: AC
  Administered 2017-10-11: 2 g via INTRAVENOUS
  Filled 2017-10-11: qty 50

## 2017-10-11 MED ORDER — POTASSIUM CHLORIDE 10 MEQ/50ML IV SOLN
10.0000 meq | INTRAVENOUS | Status: DC
  Administered 2017-10-11: 10 meq via INTRAVENOUS
  Filled 2017-10-11 (×2): qty 50

## 2017-10-11 MED ORDER — OXYCODONE HCL 5 MG PO TABS
5.0000 mg | ORAL_TABLET | ORAL | Status: DC | PRN
  Administered 2017-10-11 – 2017-10-12 (×2): 5 mg via ORAL
  Filled 2017-10-11 (×3): qty 1

## 2017-10-11 MED ORDER — MAGNESIUM SULFATE 2 GM/50ML IV SOLN: 2.0000 g | Freq: Once | INTRAVENOUS | Status: DC

## 2017-10-11 MED ORDER — BENZONATATE 100 MG PO CAPS: 100.0000 mg | ORAL_CAPSULE | Freq: Two times a day (BID) | ORAL | Status: DC | PRN

## 2017-10-11 MED ORDER — POTASSIUM PHOSPHATE MONOBASIC 500 MG PO TABS
500.0000 mg | ORAL_TABLET | Freq: Three times a day (TID) | ORAL | Status: AC
  Administered 2017-10-11 (×2): 500 mg via ORAL
  Filled 2017-10-11 (×2): qty 1

## 2017-10-11 NOTE — Progress Notes (Signed)
PT Cancellation Note  Patient Details Name: Savannah Mcclure MRN: [id number] DOB: [birth year]   Cancelled Treatment:    Reason Eval/Treat Not Completed: Patient not medically ready.  Pt's HR noted to be 140-141 bpm at rest (pt eating breakfast in bed).  D/t elevated HR at rest, will hold PT at this time and re-attempt PT evaluation at a later date/time as medically appropriate.  Leitha Bleak, PT 10/11/17, 9:00 AM [phone]

## 2017-10-11 NOTE — Progress Notes (Signed)
Pharmacy Antibiotic Note  Savannah Mcclure is a 77 y.o. female admitted on 10/07/2017 with sepsis.  Pharmacy has been consulted for ceftriaxone dosing. Patient previously ordered Zosyn and vancomycin. Patient is influenza A & B negative. MRSA PCR is also negative.   Plan: Ceftriaxone 1g IV Q24hr through 2/28.   Height: 5\' 3"  (160 cm) Weight: 104 lb 8 oz (47.4 kg) IBW/kg (Calculated) : 52.4  Temp (24hrs), Avg:97.8 F (36.6 C), Min:97.6 F (36.4 C), Max:98 F (36.7 C)  Recent Labs  Lab 10/07/17 2113 10/08/17 0151 10/08/17 1442 10/09/17 0426 10/09/17 1043 10/09/17 1408 10/10/17 0525 10/11/17 0410  WBC 17.1* 14.4*  --  14.8*  --   --   --   --   CREATININE 0.95 0.91 0.76 0.74  --   --  0.64 0.69  LATICACIDVEN 1.1 2.4* 1.7  --  1.7 1.8  --   --     Estimated Creatinine Clearance: 44.1 mL/min (by C-G formula based on SCr of 0.69 mg/dL).    Allergies  Allergen Reactions  . Amlodipine Hives  . Azithromycin Hives  . Doxycycline Diarrhea  . Erythromycin Diarrhea  . Hydrocodone Nausea Only  . Lactose Intolerance (Gi)   . Minocycline Diarrhea  . Nsaids Other (See Comments)    Internal Bleeding  . Relafen [Nabumetone] Hives  . Sulfa Antibiotics Hives  . Tetracyclines & Related Diarrhea    Antimicrobials this admission: Vancomycin 2/23 >> 2/25 Zosyn 2/23 >> 2/25 Ceftriaxone 2/25 >> 2/28  Dose adjustments this admission: N/A  Microbiology results: 2/23 BCx: no growth x 4 days  2/24 MRSA PCR: negative 2/23 Influenza A & B: negative  2/24 UCx: no growth  2/25 Procalcitonin: 0.21  2/23 CXR: No evidence of acute cardiopulmonary disease Thank you for allowing pharmacy to be a part of this patient's care.  Simpson,Michael L 10/11/2017 8:29 PM

## 2017-10-11 NOTE — Consult Note (Signed)
PULMONARY / CRITICAL CARE MEDICINE   Name: Savannah Mcclure MRN: [id number] DOB: 1941-07-14    ADMISSION DATE:  10/07/2017   CONSULTATION DATE:  10/08/2017  REFERRING MD:  Dr Marcille Blanco  REASON: Hypotension   SUBJECTIVE Alert and awake this AM Off vasopressors No acute distress     REVIEW OF SYSTEMS:   unremarkable  VITAL SIGNS: BP (!) 146/89   Pulse (!) 132   Temp 98.7 F (37.1 C) (Axillary)   Resp (!) 21   Ht 5\' 3"  (1.6 m)   Wt 104 lb 8 oz (47.4 kg)   SpO2 (!) 89%   BMI 18.51 kg/m   HEMODYNAMICS:    VENTILATOR SETTINGS:    INTAKE / OUTPUT: I/O last 3 completed shifts: In: 1320.1 [P.O.:930; I.V.:190.1; IV Piggyback:200] Out: 2825 [Urine:2825]  PHYSICAL EXAMINATION: General: Acutely ill looking Neuro: Alert and oriented x3, cranial nerves intact HEENT: PERRLA Cardiovascular: Tachycardic, regular, S1-S2, no murmur regurg or gallop Lungs: Increased work of breathing, diffuse rhonchi and wheezes in all lung fields, bibasilar Rales Abdomen: Nondistended, normal bowel sounds Musculoskeletal: No deformities Skin: Warm and dry  CULTURES: Blood cultures x2-NGTD  ANTIBIOTICS: Vancomycin-discontinued Zosyn  SIGNIFICANT EVENTS: 01/13>discharged 02/23>readmitted  LINES/TUBES: PIVs  DISCUSSION: 77 year old female presenting with recurrent pneumonia, sepsis secondary to pneumonia, septic shock and acute COPD exacerbation.  Rapid influenza negative  ASSESSMENT Septic shock-resolved Sepsis secondary to pneumonia Acute COPD exacerbation Healthcare acquired pneumonia Current smoker History of hypertension  PLAN Supplemental oxygen to maintain SPO2 greater than 90 Antibiotics as above Follow-up cultures Inhaled bronchodilators and steroids IV steroids Pulmonary hygiene Flutter valve and incentive spirometer GI and DVT prophylaxis  Floor care status  Pressley Barsky Patricia Pesa, M.D.  [facility]  Medical Director  [place] Director [facility] Cardio-Pulmonary Department

## 2017-10-11 NOTE — Progress Notes (Signed)
Pharmacy Electrolyte Monitoring Consult:  Pharmacy consulted to assist in monitoring and replacing electrolytes in this 77 y.o. female admitted on 10/07/2017 with Blood Infection  Patient received potassium 94mEq IV x 1 and magnesium 2g IV x 1 this am.   Labs:  Sodium (mmol/L)  Date Value  10/11/2017 139  09/12/2012 132 (L)   Potassium (mmol/L)  Date Value  10/11/2017 3.2 (L)  09/11/2012 3.8   Magnesium (mg/dL)  Date Value  10/11/2017 1.7   Phosphorus (mg/dL)  Date Value  10/11/2017 2.4 (L)   Calcium (mg/dL)  Date Value  10/11/2017 7.8 (L)   Calcium, Total (mg/dL)  Date Value  09/11/2012 8.5   Albumin (g/dL)  Date Value  10/07/2017 4.4    Plan: Potassium phosphate 500mg  PO x 2 doses. Will recheck electrolytes with am labs.   Pharmacy will continue to monitor and adjust per consult.   Simpson,Michael L 10/11/2017 8:30 PM

## 2017-10-11 NOTE — Progress Notes (Signed)
[place] [facility] [place] NAME: [place]    MR#:  [id number]  DATE OF BIRTH:  12/14/1940  SUBJECTIVE:   No acute events overnight, off vasopressors. Complaining of some back pain. Respiratory status is stable.  REVIEW OF SYSTEMS:    Review of Systems  Constitutional: Negative for chills and fever.  HENT: Negative for congestion and tinnitus.   Eyes: Negative for blurred vision and double vision.  Respiratory: Negative for cough, shortness of breath and wheezing.   Cardiovascular: Negative for chest pain, orthopnea and PND.  Gastrointestinal: Negative for abdominal pain, diarrhea, nausea and vomiting.  Genitourinary: Negative for dysuria and hematuria.  Musculoskeletal: Positive for back pain.  Neurological: Negative for dizziness, sensory change and focal weakness.  All other systems reviewed and are negative.   Nutrition: Regular Tolerating Diet: Yes Tolerating PT: Await Eval.    DRUG ALLERGIES:   Allergies  Allergen Reactions  . Amlodipine Hives  . Azithromycin Hives  . Doxycycline Diarrhea  . Erythromycin Diarrhea  . Hydrocodone Nausea Only  . Lactose Intolerance (Gi)   . Minocycline Diarrhea  . Nsaids Other (See Comments)    Internal Bleeding  . Relafen [Nabumetone] Hives  . Sulfa Antibiotics Hives  . Tetracyclines & Related Diarrhea    VITALS:  Blood pressure (!) 146/89, pulse (!) 132, temperature 98.7 F (37.1 C), temperature source Axillary, resp. rate (!) 21, height '5\' 3"'  (1.6 m), weight 47.4 kg (104 lb 8 oz), SpO2 (!) 89 %.  PHYSICAL EXAMINATION:   Physical Exam  GENERAL:  77 y.o.-year-old patient lying in bed in no acute distress.  EYES: Pupils equal, round, reactive to light and accommodation. No scleral icterus. Extraocular muscles intact.  HEENT: Head atraumatic, normocephalic. Oropharynx and nasopharynx clear.  NECK:  Supple, no jugular venous distention. No thyroid enlargement, no tenderness.   LUNGS: Normal breath sounds bilaterally, no wheezing, rales, rhonchi. No use of accessory muscles of respiration.  CARDIOVASCULAR: S1, S2 Tachycardic. No murmurs, rubs, or gallops.  ABDOMEN: Soft, nontender, nondistended. Bowel sounds present. No organomegaly or mass.  EXTREMITIES: No cyanosis, clubbing or edema b/l.    NEUROLOGIC: Cranial nerves II through XII are intact. No focal Motor or sensory deficits b/l. Globally weak.   PSYCHIATRIC: The patient is alert and oriented x 3.  SKIN: No obvious rash, lesion, or ulcer.    LABORATORY PANEL:   CBC Recent Labs  Lab 10/09/17 0426  WBC 14.8*  HGB 9.2*  HCT 28.1*  PLT 267   ------------------------------------------------------------------------------------------------------------------  Chemistries  Recent Labs  Lab 10/07/17 2113  10/11/17 0410  NA 135   < > 139  K 3.3*   < > 3.2*  CL 99*   < > 107  CO2 24   < > 24  GLUCOSE 133*   < > 86  BUN 18   < > 12  CREATININE 0.95   < > 0.69  CALCIUM 9.2   < > 7.8*  MG  --    < > 1.7  AST 21  --   --   ALT 14  --   --   ALKPHOS 67  --   --   BILITOT 1.5*  --   --    < > = values in this interval not displayed.   ------------------------------------------------------------------------------------------------------------------  Cardiac Enzymes No results for input(s): TROPONINI in the last 168 hours. ------------------------------------------------------------------------------------------------------------------  RADIOLOGY:  No results found.   ASSESSMENT AND PLAN:   77 year old female with  past medical history of COPD, scoliosis, hypertension who presented to the hospital due to sepsis secondary to pneumonia.  1. Sepsis-patient met criteria admission given her hypotension, leukocytosis and chest x-ray findings suggestive of pneumonia. -Now weaned off IV vasopressors, antibiotics now down to just IV ceftriaxone. Much improved since admission.  2. Pneumonia-continue IV  ceftriaxone, follow cultures.  3. Chronic back pain-continue oxycodone as needed.  4. Anxiety-continue Klonopin, Xanax.  5. Tachycardia-patient has significant sinus tachycardia which is likely related to underlying anxiety/pain. Continue to treat underlying anxiety and back pain as mentioned above. Follow heart rate.  6. Neuropathy-continue gabapentin.   All the records are reviewed and case discussed with Care Management/Social Worker. Management plans discussed with the patient, family and they are in agreement.  CODE STATUS: Full code  DVT Prophylaxis: Lovenox  TOTAL TIME TAKING CARE OF THIS PATIENT: 30 minutes.   POSSIBLE D/C IN 2-3 DAYS, DEPENDING ON CLINICAL CONDITION.   Henreitta Leber M.D on 10/11/2017 at 3:34 PM  Between 7am to 6pm - Pager - [phone]  After 6pm go to [email or web address] - Proofreader  [facility] [place] [facility]  Office  [phone]  CC: Primary care physician; Patient, No Pcp Per

## 2017-10-12 LAB — BASIC METABOLIC PANEL
ANION GAP: 7 (ref 5–15)
BUN: 10 mg/dL (ref 6–20)
CHLORIDE: 102 mmol/L (ref 101–111)
CO2: 30 mmol/L (ref 22–32)
Calcium: 7.7 mg/dL — ABNORMAL LOW (ref 8.9–10.3)
Creatinine, Ser: 0.58 mg/dL (ref 0.44–1.00)
GFR calc non Af Amer: >60 mL/min (ref >60)
Glucose, Bld: 108 mg/dL — ABNORMAL HIGH (ref 65–99)
POTASSIUM: 3.4 mmol/L — AB (ref 3.5–5.1)
SODIUM: 139 mmol/L (ref 135–145)

## 2017-10-12 LAB — MAGNESIUM: MAGNESIUM: 1.8 mg/dL (ref 1.7–2.4)

## 2017-10-12 LAB — CULTURE, BLOOD (ROUTINE X 2)
CULTURE: NO GROWTH
Culture: NO GROWTH
SPECIAL REQUESTS: ADEQUATE

## 2017-10-12 LAB — PHOSPHORUS: PHOSPHORUS: 3.2 mg/dL (ref 2.5–4.6)

## 2017-10-12 MED ORDER — DILTIAZEM HCL 30 MG PO TABS
30.0000 mg | ORAL_TABLET | Freq: Four times a day (QID) | ORAL | Status: DC
  Administered 2017-10-12 – 2017-10-13 (×2): 30 mg via ORAL
  Filled 2017-10-12 (×3): qty 1

## 2017-10-12 MED ORDER — IPRATROPIUM-ALBUTEROL 0.5-2.5 (3) MG/3ML IN SOLN
3.0000 mL | Freq: Three times a day (TID) | RESPIRATORY_TRACT | Status: DC
  Administered 2017-10-12 – 2017-10-13 (×3): 3 mL via RESPIRATORY_TRACT
  Filled 2017-10-12 (×3): qty 3

## 2017-10-12 MED ORDER — LIDOCAINE 5 % EX PTCH
1.0000 | MEDICATED_PATCH | CUTANEOUS | Status: DC
  Administered 2017-10-12: 13:00:00 1 via TRANSDERMAL
  Filled 2017-10-12 (×2): qty 1

## 2017-10-12 MED ORDER — POTASSIUM CHLORIDE CRYS ER 20 MEQ PO TBCR
40.0000 meq | EXTENDED_RELEASE_TABLET | Freq: Once | ORAL | Status: DC
  Filled 2017-10-12: qty 2

## 2017-10-12 MED ORDER — CARVEDILOL 3.125 MG PO TABS
3.1250 mg | ORAL_TABLET | Freq: Two times a day (BID) | ORAL | Status: DC
  Administered 2017-10-12: 13:00:00 3.125 mg via ORAL
  Filled 2017-10-12: qty 1

## 2017-10-12 MED ORDER — IPRATROPIUM-ALBUTEROL 0.5-2.5 (3) MG/3ML IN SOLN: 3.0000 mL | RESPIRATORY_TRACT | Status: DC | PRN

## 2017-10-12 MED ORDER — CALCIUM CARBONATE ANTACID 500 MG PO CHEW: 500.0000 mg | CHEWABLE_TABLET | Freq: Two times a day (BID) | ORAL | Status: AC

## 2017-10-12 NOTE — Progress Notes (Signed)
Pharmacy Electrolyte Monitoring Consult:  Pharmacy consulted to assist in monitoring and replacing electrolytes in this 77 y.o. female admitted on 10/07/2017 with Blood Infection  Patient received supplements yesterday but nothing in the last 8 hours.   Labs:  Sodium (mmol/L)  Date Value  10/12/2017 139  09/12/2012 132 (L)   Potassium (mmol/L)  Date Value  10/12/2017 3.4 (L)  09/11/2012 3.8   Magnesium (mg/dL)  Date Value  10/12/2017 1.8   Phosphorus (mg/dL)  Date Value  10/12/2017 3.2   Calcium (mg/dL)  Date Value  10/12/2017 7.7 (L)   Calcium, Total (mg/dL)  Date Value  09/11/2012 8.5   Albumin (g/dL)  Date Value  10/07/2017 4.4    Plan: Phos 3.2, Mg 1.8, Ca 7.7, K 3.4. Give potassium chloride 40 mEq po x 1, calcium carbonate 500 mg (elemental calcium) po BID x 2 doses, and recheck electrolytes tomorrow with AM labs.  Pharmacy will continue to monitor and adjust per consult.   Laural Benes, Pharm.D., BCPS Clinical Pharmacist 10/12/2017 7:32 AM

## 2017-10-12 NOTE — Progress Notes (Signed)
MD notified of pt refusing calcium tums and k tabs today. Encouraged pt to eat bananas

## 2017-10-12 NOTE — Plan of Care (Signed)
  Progressing Fluid Volume: Hemodynamic stability will improve 10/12/2017 1811 - Progressing by Rowe Robert, RN Clinical Measurements: Diagnostic test results will improve 10/12/2017 1811 - Progressing by Rowe Robert, RN Signs and symptoms of infection will decrease 10/12/2017 1811 - Progressing by Rowe Robert, RN Respiratory: Ability to maintain adequate ventilation will improve 10/12/2017 1811 - Progressing by Rowe Robert, RN Activity: Ability to tolerate increased activity will improve 10/12/2017 1811 - Progressing by Rowe Robert, RN Clinical Measurements: Ability to maintain a body temperature in the normal range will improve 10/12/2017 1811 - Progressing by Rowe Robert, RN Respiratory: Ability to maintain adequate ventilation will improve 10/12/2017 1811 - Progressing by Rowe Robert, RN Ability to maintain a clear airway will improve 10/12/2017 1811 - Progressing by Rowe Robert, RN Education: Knowledge of General Education information will improve 10/12/2017 1811 - Progressing by Rowe Robert, RN Health Behavior/Discharge Planning: Ability to manage health-related needs will improve 10/12/2017 1811 - Progressing by Rowe Robert, RN Clinical Measurements: Ability to maintain clinical measurements within normal limits will improve 10/12/2017 1811 - Progressing by Rowe Robert, RN Will remain free from infection 10/12/2017 1811 - Progressing by Rowe Robert, RN Diagnostic test results will improve 10/12/2017 1811 - Progressing by Rowe Robert, RN Respiratory complications will improve 10/12/2017 1811 - Progressing by Rowe Robert, RN Cardiovascular complication will be avoided 10/12/2017 1811 - Progressing by Rowe Robert, RN Activity: Risk for activity intolerance will decrease 10/12/2017 1811 - Progressing by Rowe Robert, RN Nutrition: Adequate nutrition will be maintained 10/12/2017 1811 - Progressing by Rowe Robert, RN Coping: Level of  anxiety will decrease 10/12/2017 1811 - Progressing by Rowe Robert, RN Elimination: Will not experience complications related to bowel motility 10/12/2017 1811 - Progressing by Rowe Robert, RN Will not experience complications related to urinary retention 10/12/2017 1811 - Progressing by Rowe Robert, RN Pain Managment: General experience of comfort will improve 10/12/2017 1811 - Progressing by Rowe Robert, RN Safety: Ability to remain free from injury will improve 10/12/2017 1811 - Progressing by Rowe Robert, RN Skin Integrity: Risk for impaired skin integrity will decrease 10/12/2017 1811 - Progressing by Rowe Robert, RN

## 2017-10-12 NOTE — Progress Notes (Signed)
[facility] [place] NAME: [place] II    MR#:  [id number]  DATE OF BIRTH:  [birth year]  SUBJECTIVE:   Back pain has improved.  Continues to be quite tachycardic but is asymptomatic.  Shortness of breath has improved.    REVIEW OF SYSTEMS:    Review of Systems  Constitutional: Negative for chills and fever.  HENT: Negative for congestion and tinnitus.   Eyes: Negative for blurred vision and double vision.  Respiratory: Negative for cough, shortness of breath and wheezing.   Cardiovascular: Negative for chest pain, orthopnea and PND.  Gastrointestinal: Negative for abdominal pain, diarrhea, nausea and vomiting.  Genitourinary: Negative for dysuria and hematuria.  Musculoskeletal: Positive for back pain.  Neurological: Negative for dizziness, sensory change and focal weakness.  All other systems reviewed and are negative.   Nutrition: Regular Tolerating Diet: Yes Tolerating PT: Await Eval.   DRUG ALLERGIES:   Allergies  Allergen Reactions  . Amlodipine Hives  . Azithromycin Hives  . Doxycycline Diarrhea  . Erythromycin Diarrhea  . Hydrocodone Nausea Only  . Lactose Intolerance (Gi)   . Minocycline Diarrhea  . Nsaids Other (See Comments)    Internal Bleeding  . Relafen [Nabumetone] Hives  . Sulfa Antibiotics Hives  . Tetracyclines & Related Diarrhea    VITALS:  Blood pressure 120/72, pulse (!) 158, temperature 98.1 F (36.7 C), temperature source Oral, resp. rate 15, height 5' 3" (1.6 m), weight 52.9 kg (116 lb 10 oz), SpO2 90 %.  PHYSICAL EXAMINATION:   Physical Exam  GENERAL:  77 y.o.-year-old patient lying in bed in no acute distress.  EYES: Pupils equal, round, reactive to light and accommodation. No scleral icterus. Extraocular muscles intact.  HEENT: Head atraumatic, normocephalic. Oropharynx and nasopharynx clear.  NECK:  Supple, no jugular venous distention. No thyroid enlargement, no tenderness.   LUNGS: Normal breath sounds bilaterally, no wheezing, rales, rhonchi. No use of accessory muscles of respiration.  CARDIOVASCULAR: S1, S2 Tachycardic. No murmurs, rubs, or gallops.  ABDOMEN: Soft, nontender, nondistended. Bowel sounds present. No organomegaly or mass.  EXTREMITIES: No cyanosis, clubbing or edema b/l.    NEUROLOGIC: Cranial nerves II through XII are intact. No focal Motor or sensory deficits b/l. Globally weak.   PSYCHIATRIC: The patient is alert and oriented x 3.  SKIN: No obvious rash, lesion, or ulcer.    LABORATORY PANEL:   CBC Recent Labs  Lab 10/09/17 0426  WBC 14.8*  HGB 9.2*  HCT 28.1*  PLT 267   ------------------------------------------------------------------------------------------------------------------  Chemistries  Recent Labs  Lab 10/07/17 2113  10/12/17 0525  NA 135   < > 139  K 3.3*   < > 3.4*  CL 99*   < > 102  CO2 24   < > 30  GLUCOSE 133*   < > 108*  BUN 18   < > 10  CREATININE 0.95   < > 0.58  CALCIUM 9.2   < > 7.7*  MG  --    < > 1.8  AST 21  --   --   ALT 14  --   --   ALKPHOS 67  --   --   BILITOT 1.5*  --   --    < > = values in this interval not displayed.   ------------------------------------------------------------------------------------------------------------------  Cardiac Enzymes No results for input(s): TROPONINI in the last 168 hours. ------------------------------------------------------------------------------------------------------------------  RADIOLOGY:  No results found.   ASSESSMENT AND PLAN:   77 year old  female with past medical history of COPD, scoliosis, hypertension who presented to the hospital due to sepsis secondary to pneumonia.  1. Sepsis-patient met criteria admission given her hypotension, leukocytosis and chest x-ray findings suggestive of pneumonia. -Now weaned off IV vasopressors, cont. IV Ceftriaxone.  - cultures so far are (-). Much improved since admission.  2.  Pneumonia-continue IV ceftriaxone, follow cultures which are (-) so far.   3. Chronic back pain-continue oxycodone as needed. - Back pain improved since yesterday.   4. Anxiety-continue Klonopin, Xanax.  5. Tachycardia-patient has significant sinus tachycardia which is likely related to underlying anxiety/pain.  - She apparently cannot tolerate metoprolol, we'll start the patient on some low-dose Cardizem, follow response.   6. Neuropathy-continue gabapentin.  Await PT eval.    All the records are reviewed and case discussed with Care Management/Social Worker. Management plans discussed with the patient, family and they are in agreement.  CODE STATUS: Full code  DVT Prophylaxis: Lovenox  TOTAL TIME TAKING CARE OF THIS PATIENT: 30 minutes.   POSSIBLE D/C IN 2-3 DAYS, DEPENDING ON CLINICAL CONDITION.   Henreitta Leber M.D on 10/12/2017 at 12:44 PM  Between 7am to 6pm - Pager - [phone]  After 6pm go to [email or web address] - Proofreader  [facility]  Office  [phone]  CC: Primary care physician; Patient, No Pcp Per

## 2017-10-12 NOTE — Care Management Note (Addendum)
Case Management Note  Patient Details  Name: Savannah Mcclure MRN: [id number] Date of Birth: 1941/08/14  Subjective/Objective: Admitted to [facility] with the diagnosis of sepsis.   Lives with family. Daughter is Garfield Cornea [phone]),  Appointment has been scheduled with Dr. Rance Muir [facility] 10/17/17. No home Health. No skilled nursing. Home oxygen per [place] since 08/2017.  Takes care of all basic activities of daily living herself. Family will transport                 Action/Plan: Physical therapy evaluation pending   Expected Discharge Date:                  Expected Discharge Plan:     In-House Referral:     Discharge planning Services     Post Acute Care Choice:    Choice offered to:     DME Arranged:    DME Agency:     HH Arranged:    HH Agency:     Status of Service:     If discussed at [employer], dates discussed:    Additional Comments:  Shelbie Ammons, RN MSN CCM Care Management [phone] 10/12/2017, 11:52 AM

## 2017-10-12 NOTE — Evaluation (Signed)
Physical Therapy Evaluation Patient Details Name: Savannah Mcclure MRN: [id number] DOB: 06/16/1941 Today's Date: 10/12/2017   History of Present Illness  Pt is a 77 y.o. F who presented to hospital w/ SOB and fatigue.  Pt was admitted to hospital on 10/07/17 with diagnosis of pneumonia, sepsis, hypotension, and significant tachycardia. PMHx includes: cardiac arrest, COPD, HTN, scoliosis, sepsis, and spinal stenosis.    Clinical Impression  Pt demonstrated mild SOB and elevated HR 146 bpm upon PT arrival. Pt appeared anxious and reported the need to use bedside commode for bowel movement. Pt demonstrated overall good balance with sit to stand and stand pivot transfer to bedside commode and then to chair. However, Pt demonstrated SOB, global weakness, fatigue, and HR elevated to 153 bpm with activity. End of session, Pt was in chair and comfortable without SOB, however HR was noted to still be elevated. Nursing was notified of Pt's HR throughout session.    Follow Up Recommendations Home health PT    Equipment Recommendations  Cane;3in1 (PT)    Recommendations for Other Services       Precautions / Restrictions Restrictions Weight Bearing Restrictions: No      Mobility  Bed Mobility Overal bed mobility: Modified Independent             General bed mobility comments: Pt requires extra time and effort.  Transfers Overall transfer level: Needs assistance   Transfers: Sit to/from Stand;Stand Pivot Transfers Sit to Stand: Min assist Stand pivot transfers: Min guard       General transfer comment: Pt demonstrated good technique with sit to stand and stand pivot transfer. However, Pt demonstrated global weakness, SOB, and elevated HR 153 bpm.  Ambulation/Gait             General Gait Details: Deferred secondary to elevated HR 153bpm and SOB.  Stairs            Wheelchair Mobility    Modified Rankin (Stroke Patients Only)       Balance Overall  balance assessment: Needs assistance Sitting-balance support: No upper extremity supported;Feet supported Sitting balance-Leahy Scale: Good Sitting balance - Comments: PT demonstrated good balance while toileting and reaching within BOS.   Standing balance support: Single extremity supported Standing balance-Leahy Scale: Good Standing balance comment: Pt demonstrated good balance with stand pivot transfer w/ UE support.                             Pertinent Vitals/Pain Pain Assessment: 0-10 Pain Score: 5  Pain Location: Pt reports B foot neuropathy. Pain Descriptors / Indicators: Burning;Aching Pain Intervention(s): Limited activity within patient's tolerance;Monitored during session;Premedicated before session    Home Living Family/patient expects to be discharged to:: Private residence Living Arrangements: Children   Type of Home: Mobile home Home Access: Stairs to enter Entrance Stairs-Rails: Can reach both Entrance Stairs-Number of Steps: 5 Home Layout: One level Home Equipment: Cane - single point Additional Comments: PT states that she needs grab bars near the tub and toilet and a shower seat.    Prior Function Level of Independence: Independent with assistive device(s)         Comments: Pt reports that she uses a spc in the community and furniture walks within her home.     Hand Dominance        Extremity/Trunk Assessment   Upper Extremity Assessment Upper Extremity Assessment: Overall WFL for tasks assessed;Generalized weakness;RUE deficits/detail RUE Deficits / Details: General  BUE ROM is WFL. BUE strength is at least 3/5, unable to fully assess due to elevated HR 153bpm.    Lower Extremity Assessment Lower Extremity Assessment: Overall WFL for tasks assessed;Generalized weakness;RLE deficits/detail;LLE deficits/detail RLE Deficits / Details: General BLE ROM is WFL. BLE strength is at least 3/5, unable to fully assess due to elevated HR  153bpm. RLE Sensation: history of peripheral neuropathy LLE Deficits / Details: General BLE ROM is WFL. BLE strength is at least 3/5, unable to fully assess due to elevated HR 153bpm. LLE Sensation: history of peripheral neuropathy    Cervical / Trunk Assessment Cervical / Trunk Assessment: Kyphotic  Communication   Communication: No difficulties  Cognition Arousal/Alertness: Awake/alert Behavior During Therapy: WFL for tasks assessed/performed Overall Cognitive Status: Within Functional Limits for tasks assessed                                 General Comments: PT A&O x 4      General Comments      Exercises     Assessment/Plan    PT Assessment Patient needs continued PT services  PT Problem List Decreased strength;Decreased activity tolerance;Decreased mobility;Decreased safety awareness;Decreased knowledge of precautions;Cardiopulmonary status limiting activity;Pain       PT Treatment Interventions DME instruction;Gait training;Stair training;Functional mobility training;Therapeutic activities;Therapeutic exercise;Balance training;Patient/family education    PT Goals (Current goals can be found in the Care Plan section)  Acute Rehab PT Goals Patient Stated Goal: I want to go home. PT Goal Formulation: With patient Time For Goal Achievement: 10/26/17 Potential to Achieve Goals: Good    Frequency Min 2X/week   Barriers to discharge   Pt needs grab bars in bathroom for safe transfers to toilet and bathing.    Co-evaluation               AM-PAC PT "6 Clicks" Daily Activity  Outcome Measure Difficulty turning over in bed (including adjusting bedclothes, sheets and blankets)?: A Little Difficulty moving from lying on back to sitting on the side of the bed? : A Little Difficulty sitting down on and standing up from a chair with arms (e.g., wheelchair, bedside commode, etc,.)?: Unable Help needed moving to and from a bed to chair (including a  wheelchair)?: A Little Help needed walking in hospital room?: A Little Help needed climbing 3-5 steps with a railing? : A Lot 6 Click Score: 16    End of Session Equipment Utilized During Treatment: Gait belt;Oxygen Activity Tolerance: Other (comment);Patient limited by fatigue(Pt limited by elevated HR 153bpm and SOB.) Patient left: in chair;with call bell/phone within reach;with chair alarm set Nurse Communication: Mobility status;Other (comment)(Notified RN of elevated HR 153 bpm..) PT Visit Diagnosis: Unsteadiness on feet (R26.81);Other abnormalities of gait and mobility (R26.89);Muscle weakness (generalized) (M62.81);Difficulty in walking, not elsewhere classified (R26.2)    Time: 1150-1230 PT Time Calculation (min) (ACUTE ONLY): 40 min   Charges:         PT G Codes:        Velma Agnes Mondrian-Pardue, SPT 10/12/2017, 1:08 PM

## 2017-10-13 LAB — BASIC METABOLIC PANEL
Anion gap: 8 (ref 5–15)
BUN: 10 mg/dL (ref 6–20)
CALCIUM: 7.9 mg/dL — AB (ref 8.9–10.3)
CHLORIDE: 100 mmol/L — AB (ref 101–111)
CO2: 29 mmol/L (ref 22–32)
CREATININE: 0.67 mg/dL (ref 0.44–1.00)
Glucose, Bld: 114 mg/dL — ABNORMAL HIGH (ref 65–99)
Potassium: 3.8 mmol/L (ref 3.5–5.1)
SODIUM: 137 mmol/L (ref 135–145)

## 2017-10-13 LAB — MAGNESIUM: MAGNESIUM: 1.8 mg/dL (ref 1.7–2.4)

## 2017-10-13 LAB — PHOSPHORUS: Phosphorus: 3.2 mg/dL (ref 2.5–4.6)

## 2017-10-13 MED ORDER — PREDNISONE 10 MG PO TABS: ORAL_TABLET | ORAL | 0 refills | Status: DC

## 2017-10-13 MED ORDER — IPRATROPIUM-ALBUTEROL 0.5-2.5 (3) MG/3ML IN SOLN: 3.0000 mL | Freq: Four times a day (QID) | RESPIRATORY_TRACT | 0 refills | Status: DC | PRN

## 2017-10-13 MED ORDER — DILTIAZEM HCL 30 MG PO TABS: 30.0000 mg | ORAL_TABLET | Freq: Three times a day (TID) | ORAL | 0 refills | Status: DC | PRN

## 2017-10-13 NOTE — Discharge Summary (Signed)
[facility] [place] NAME: Savannah Mcclure    MR#:  [id number]  DATE OF BIRTH:  Oct 17, 1940  DATE OF ADMISSION:  10/07/2017 ADMITTING PHYSICIAN: Lance Coon, MD  DATE OF DISCHARGE: 10/13/2017  PRIMARY CARE PHYSICIAN: Patient, No Pcp Per    ADMISSION DIAGNOSIS:  Shortness of breath [R06.02] Respiratory distress [R06.03] Sepsis, due to unspecified organism ([place]) [A41.9]  DISCHARGE DIAGNOSIS:  Principal Problem:   Sepsis ([place]) Active Problems:   HCAP (healthcare-associated pneumonia)   HTN (hypertension)   COPD with acute exacerbation ([place])   SECONDARY DIAGNOSIS:   Past Medical History:  Diagnosis Date  . Cardiac arrest ([place])   . COPD (chronic obstructive pulmonary disease) ([place])   . Hypertension   . Scoliosis   . Sepsis ([place])   . Spinal stenosis     HOSPITAL COURSE:   77 year old female with past medical history of COPD, scoliosis, hypertension who presented to the hospital due to sepsis secondary to pneumonia.  1. Sepsis-patient met criteria admission given her hypotension, leukocytosis and chest x-ray findings suggestive of pneumonia. - initially admitted to the intensive care unit and was placed on IV vasopressors, given broad-spectrum IV antibiotics which were then weaned down to just IV ceftriaxone. Patient has been adequately treated for pneumonia with IV ceftriaxone is currently stable. -She is afebrile, hemodynamically stable, cultures are negative. She is therefore being discharged home.  2. Pneumonia- patient was treated with IV ceftriaxone while in the hospital. She has clinically improved. She is currently afebrile and hemodynamically stable. - she has been adequately treated for 7 days with IV abx.   3. Chronic back pain- she will continue her oxycodone as needed.  4. Anxiety- she will continue Klonopin, Xanax.  5. Tachycardia-patient has significant sinus tachycardia which is likely related to  underlying anxiety/pain. Patient's heart rate has improved. She'll be discharged on some as needed oral Cardizem.    6. Neuropathy- she will continue gabapentin.  7. COPD-patient will continue her Advair, Spiriva. Patient was given a nebulizer to use at home and also DuoNeb's as needed. - she will finish her Pred. Taper.   She was seen by physical therapy and recommended home health services which is being arranged for her prior to discharge.  DISCHARGE CONDITIONS:   Stable.   CONSULTS OBTAINED:    DRUG ALLERGIES:   Allergies  Allergen Reactions  . Amlodipine Hives  . Azithromycin Hives  . Doxycycline Diarrhea  . Erythromycin Diarrhea  . Hydrocodone Nausea Only  . Lactose Intolerance ([place])   . Minocycline Diarrhea  . Nsaids Other (See Comments)    Internal Bleeding  . Relafen [Nabumetone] Hives  . Sulfa Antibiotics Hives  . Tetracyclines & Related Diarrhea    DISCHARGE MEDICATIONS:   Allergies as of 10/13/2017      Reactions   Amlodipine Hives   Azithromycin Hives   Doxycycline Diarrhea   Erythromycin Diarrhea   Hydrocodone Nausea Only   Lactose Intolerance (gi)    Minocycline Diarrhea   Nsaids Other (See Comments)   Internal Bleeding   Relafen [nabumetone] Hives   Sulfa Antibiotics Hives   Tetracyclines & Related Diarrhea      Medication List    STOP taking these medications   albuterol 108 (90 Base) MCG/ACT inhaler Commonly known as:  PROVENTIL HFA;VENTOLIN HFA   guaiFENesin-dextromethorphan 100-10 MG/5ML syrup Commonly known as:  ROBITUSSIN DM   predniSONE 10 MG (21) Tbpk tablet Commonly known as:  STERAPRED UNI-PAK 21 TAB Replaced by:  predniSONE 10 MG tablet     TAKE these medications   B-COMPLEX-C PO Take 1 tablet by mouth daily.   benazepril 40 MG tablet Commonly known as:  LOTENSIN Take 40 mg by mouth daily.   cilostazol 100 MG tablet Commonly known as:  PLETAL Take 100 mg by mouth 2 (two) times daily.   clonazePAM 0.5 MG  tablet Commonly known as:  KLONOPIN Take 0.5 mg by mouth at bedtime.   diltiazem 30 MG tablet Commonly known as:  CARDIZEM Take 1 tablet (30 mg total) by mouth 3 (three) times daily as needed (Tachycardia HR > 130.).   Fluticasone-Salmeterol 250-50 MCG/DOSE Aepb Commonly known as:  ADVAIR DISKUS Inhale 1 puff into the lungs 2 (two) times daily.   gabapentin 100 MG capsule Commonly known as:  NEURONTIN Take 100 mg by mouth 2 (two) times daily.   ipratropium-albuterol 0.5-2.5 (3) MG/3ML Soln Commonly known as:  DUONEB Take 3 mLs by nebulization every 6 (six) hours as needed.   lidocaine 5 % Commonly known as:  LIDODERM Place 1 patch onto the skin daily. Remove & Discard patch within 12 hours or as directed by MD   multivitamin with minerals tablet Take 1 tablet by mouth daily.   niacinamide 100 MG tablet Take 100 mg by mouth 2 (two) times daily with a meal.   oxyCODONE 5 MG immediate release tablet Commonly known as:  Oxy IR/ROXICODONE Take 5 mg by mouth 2 (two) times daily as needed for severe pain.   predniSONE 10 MG tablet Commonly known as:  DELTASONE Label  & dispense according to the schedule below. 5 Pills PO for 1 day then, 4 Pills PO for 1 day, 3 Pills PO for 1 day, 2 Pills PO for 1 day, 1 Pill PO for 1 days then STOP. Replaces:  predniSONE 10 MG (21) Tbpk tablet   tiotropium 18 MCG inhalation capsule Commonly known as:  SPIRIVA HANDIHALER Place 1 capsule (18 mcg total) into inhaler and inhale daily.            Durable Medical Equipment  (From admission, onward)        Start     Ordered   10/13/17 1104  For home use only DME Nebulizer machine  Once    Question:  Patient needs a nebulizer to treat with the following condition  Answer:  COPD (chronic obstructive pulmonary disease) ([place])   10/13/17 1103        DISCHARGE INSTRUCTIONS:   DIET:  Cardiac diet  DISCHARGE CONDITION:  Stable  ACTIVITY:  Activity as tolerated  OXYGEN:  Home  Oxygen: Yes.     Oxygen Delivery: 2 liters/min via Patient connected to nasal cannula oxygen  DISCHARGE LOCATION:  Home with Home Health PT   If you experience worsening of your admission symptoms, develop shortness of breath, life threatening emergency, suicidal or homicidal thoughts you must seek medical attention immediately by calling 911 or calling your MD immediately  if symptoms less severe.  You Must read complete instructions/literature along with all the possible adverse reactions/side effects for all the Medicines you take and that have been prescribed to you. Take any new Medicines after you have completely understood and accpet all the possible adverse reactions/side effects.   Please note  You were cared for by a hospitalist during your hospital stay. If you have any questions about your discharge medications or the care you received while you were in the hospital after you are discharged, you can call  the unit and asked to speak with the hospitalist on call if the hospitalist that took care of you is not available. Once you are discharged, your primary care physician will handle any further medical issues. Please note that NO REFILLS for any discharge medications will be authorized once you are discharged, as it is imperative that you return to your primary care physician (or establish a relationship with a primary care physician if you do not have one) for your aftercare needs so that they can reassess your need for medications and monitor your lab values.     Today   Pt's. HR much improved.  Wheezing, bronchospasm has improved. Will d/c home today.   VITAL SIGNS:  Blood pressure (!) 115/55, pulse 88, temperature 97.8 F (36.6 C), temperature source Axillary, resp. rate 16, height _0  (1.6 m), weight 52.9 kg (116 lb 10 oz), SpO2 92 %.  I/O:    Intake/Output Summary (Last 24 hours) at 10/13/2017 1238 Last data filed at 10/13/2017 0958 Gross per 24 hour  Intake 480 ml   Output 1800 ml  Net -1320 ml    PHYSICAL EXAMINATION:   GENERAL:  77 y.o.-year-old patient lying in bed in no acute distress.  EYES: Pupils equal, round, reactive to light and accommodation. No scleral icterus. Extraocular muscles intact.  HEENT: Head atraumatic, normocephalic. Oropharynx and nasopharynx clear.  NECK:  Supple, no jugular venous distention. No thyroid enlargement, no tenderness.  LUNGS: Normal breath sounds bilaterally, no wheezing, rales, rhonchi. No use of accessory muscles of respiration.  CARDIOVASCULAR: S1, S2 Tachycardic. No murmurs, rubs, or gallops.  ABDOMEN: Soft, nontender, nondistended. Bowel sounds present. No organomegaly or mass.  EXTREMITIES: No cyanosis, clubbing or edema b/l.    NEUROLOGIC: Cranial nerves II through XII are intact. No focal Motor or sensory deficits b/l. PSYCHIATRIC: The patient is alert and oriented x 3.  SKIN: No obvious rash, lesion, or ulcer.    DATA REVIEW:   CBC Recent Labs  Lab 10/09/17 0426  WBC 14.8*  HGB 9.2*  HCT 28.1*  PLT 267    Chemistries  Recent Labs  Lab 10/07/17 2113  10/13/17 0447  NA 135   < > 137  K 3.3*   < > 3.8  CL 99*   < > 100*  CO2 24   < > 29  GLUCOSE 133*   < > 114*  BUN 18   < > 10  CREATININE 0.95   < > 0.67  CALCIUM 9.2   < > 7.9*  MG  --    < > 1.8  AST 21  --   --   ALT 14  --   --   ALKPHOS 67  --   --   BILITOT 1.5*  --   --    < > = values in this interval not displayed.    Cardiac Enzymes No results for input(s): TROPONINI in the last 168 hours.  Microbiology Results  Results for orders placed or performed during the hospital encounter of 10/07/17  Culture, blood (Routine x 2)     Status: None   Collection Time: 10/07/17  9:10 PM  Result Value Ref Range Status   Specimen Description BLOOD RIGHT ANTECUBITAL  Final   Special Requests   Final    BOTTLES DRAWN AEROBIC AND ANAEROBIC Blood Culture results may not be optimal due to an excessive volume of blood received in  culture bottles   Culture   Final    NO  GROWTH 5 DAYS Performed at [facility], [address] [place], [place] [address]    Report Status 10/12/2017 FINAL  Final  Culture, blood (Routine x 2)     Status: None   Collection Time: 10/07/17  9:13 PM  Result Value Ref Range Status   Specimen Description BLOOD LEFT ANTECUBITAL  Final   Special Requests   Final    BOTTLES DRAWN AEROBIC AND ANAEROBIC Blood Culture adequate volume   Culture   Final    NO GROWTH 5 DAYS Performed at [facility], [address] [place], [place] [address]    Report Status 10/12/2017 FINAL  Final  MRSA PCR Screening     Status: None   Collection Time: 10/08/17  4:12 AM  Result Value Ref Range Status   MRSA by PCR NEGATIVE NEGATIVE Final    Comment:        The GeneXpert MRSA Assay (FDA approved for NASAL specimens only), is one component of a comprehensive MRSA colonization surveillance program. It is not intended to diagnose MRSA infection nor to guide or monitor treatment for MRSA infections. Performed at [facility], [address], [place], [place] [address]   Urine Culture     Status: None   Collection Time: 10/08/17 11:37 AM  Result Value Ref Range Status   Specimen Description   Final    URINE, CATHETERIZED Performed at [facility], [address], [place], [place] [address]    Special Requests   Final    NONE Performed at [facility], [address], [place], [place] [address]    Culture   Final    NO GROWTH Performed at [facility], [place] [address], [place],  [address]    Report Status 10/10/2017 FINAL  Final    RADIOLOGY:  No results found.    Management plans discussed with the patient, family and they are in agreement.  CODE STATUS:     Code Status Orders  (From admission, onward)        Start     Ordered   10/08/17 0037  Full code  Continuous     10/08/17 0037    Code Status History    Date  Active Date Inactive Code Status Order ID Comments User Context   08/25/2017 14:59 08/27/2017 17:47 Full Code [id number]  [name], MD ED      TOTAL TIME TAKING CARE OF THIS PATIENT: 40 minutes.    Henreitta Leber M.D on 10/13/2017 at 12:38 PM  Between 7am to 6pm - Pager - [phone]  After 6pm go to [email or web address] - Proofreader  [facility]  Office  [phone]  CC: Primary care physician; Patient, No Pcp Per

## 2017-10-13 NOTE — Progress Notes (Signed)
Pharmacy Electrolyte Monitoring Consult:  Pharmacy consulted to assist in monitoring and replacing electrolytes in this 77 y.o. female admitted on 10/07/2017 with Blood Infection  Patient received supplements yesterday but nothing in the last 8 hours.   Labs:  Sodium (mmol/L)  Date Value  10/13/2017 137  09/12/2012 132 (L)   Potassium (mmol/L)  Date Value  10/13/2017 3.8  09/11/2012 3.8   Magnesium (mg/dL)  Date Value  10/13/2017 1.8   Phosphorus (mg/dL)  Date Value  10/13/2017 3.2   Calcium (mg/dL)  Date Value  10/13/2017 7.9 (L)   Calcium, Total (mg/dL)  Date Value  09/11/2012 8.5   Albumin (g/dL)  Date Value  10/07/2017 4.4    Plan: K 3.8, Ca 7.9, Phos 3.2, Mg 1.8. Patient has been refusing multiple medications. No further supplement at this time. Will recheck electrolytes with AM labs in 2 days.  Pharmacy will continue to monitor and adjust per consult.   Laural Benes, Pharm.D., BCPS Clinical Pharmacist 10/13/2017 7:21 AM

## 2017-10-13 NOTE — Progress Notes (Signed)
Patient discharged home per MD order. Prescriptions given to PATIENT. ALL DISCHARGE INSTRUCTIONS GIVEN AND ALL QUESTIONS ANSWERED.

## 2017-10-13 NOTE — Care Management Note (Signed)
Case Management Note  Patient Details  Name: Savannah Mcclure MRN: [id number] Date of Birth: 10-Jun-1941  Subjective/Objective:  Met with patient at bedside to discuss discharge planning. Patient lives at home with her daughter and family. Prior to admission she used no assistive devises. Will need a walker and nebulizer. Ordered from [facility].  She is agreeable to home health physical therapy. Offered choice of agencies. Referral to [facility] with [facility] for HHPT. Patient will see new PCP, Dr. Julian Hy on October 17, 2017                 Action/Plan: [facility] for HHPT, nebulizer and walker  Expected Discharge Date:  10/13/17               Expected Discharge Plan:  [place]  In-House Referral:     Discharge planning Services  CM Consult  Post Acute Care Choice:  Durable Medical Equipment, Home Health Choice offered to:  Patient  DME Arranged:  Walker rolling, Chiropodist DME Agency:  [place]:  PT [facility] Agency:  [place]  Status of Service:  Completed, signed off  If discussed at [place] of Stay Meetings, dates discussed:    Additional Comments:  Jolly Mango, RN 10/13/2017, 11:33 AM

## 2017-10-16 DIAGNOSIS — M419 Scoliosis, unspecified: Secondary | ICD-10-CM | POA: Diagnosis not present

## 2017-10-16 DIAGNOSIS — M48061 Spinal stenosis, lumbar region without neurogenic claudication: Secondary | ICD-10-CM | POA: Diagnosis not present

## 2017-10-16 DIAGNOSIS — I10 Essential (primary) hypertension: Secondary | ICD-10-CM | POA: Diagnosis not present

## 2017-10-16 DIAGNOSIS — Z9981 Dependence on supplemental oxygen: Secondary | ICD-10-CM | POA: Diagnosis not present

## 2017-10-16 DIAGNOSIS — A419 Sepsis, unspecified organism: Secondary | ICD-10-CM | POA: Diagnosis not present

## 2017-10-16 DIAGNOSIS — J440 Chronic obstructive pulmonary disease with acute lower respiratory infection: Secondary | ICD-10-CM | POA: Diagnosis not present

## 2017-10-16 DIAGNOSIS — J189 Pneumonia, unspecified organism: Secondary | ICD-10-CM | POA: Diagnosis not present

## 2017-10-16 DIAGNOSIS — J441 Chronic obstructive pulmonary disease with (acute) exacerbation: Secondary | ICD-10-CM | POA: Diagnosis not present

## 2017-10-16 DIAGNOSIS — Z8674 Personal history of sudden cardiac arrest: Secondary | ICD-10-CM | POA: Diagnosis not present

## 2017-10-17 ENCOUNTER — Telehealth: Payer: Self-pay

## 2017-10-17 ENCOUNTER — Telehealth: Payer: Self-pay | Admitting: Family Medicine

## 2017-10-17 ENCOUNTER — Ambulatory Visit: Payer: TRICARE For Life (TFL) | Admitting: Unknown Physician Specialty

## 2017-10-17 NOTE — Telephone Encounter (Signed)
Please advise 

## 2017-10-17 NOTE — Telephone Encounter (Signed)
Copied from [place] [id number]. Topic: Quick Communication - See Telephone Encounter >> Oct 17, 2017 12:58 PM Bea Graff, NT wrote: CRM for notification. See Telephone encounter for: Gerald Stabs with [facility] needing verbal orders for this pt PT for 2xs a week for 4 weeks. CB#: [phone]  10/17/17.

## 2017-10-17 NOTE — Telephone Encounter (Signed)
OK to give verbal orders 

## 2017-10-17 NOTE — Telephone Encounter (Signed)
EMMI Follow-up: Talked with Savannah Mcclure this morning as it was noted on the EMMI report that she had some unfilled Rxs..  She said the Rxs are the ones she needs refills on and waiting to see her PCP. She stated, MD office had to cancel todays appt. but will reschedule with her soon.

## 2017-10-18 ENCOUNTER — Telehealth: Payer: Self-pay

## 2017-10-18 NOTE — Telephone Encounter (Signed)
Verbal orders givne.

## 2017-10-19 DIAGNOSIS — I10 Essential (primary) hypertension: Secondary | ICD-10-CM | POA: Diagnosis not present

## 2017-10-19 DIAGNOSIS — A419 Sepsis, unspecified organism: Secondary | ICD-10-CM | POA: Diagnosis not present

## 2017-10-19 DIAGNOSIS — J189 Pneumonia, unspecified organism: Secondary | ICD-10-CM | POA: Diagnosis not present

## 2017-10-19 DIAGNOSIS — J440 Chronic obstructive pulmonary disease with acute lower respiratory infection: Secondary | ICD-10-CM | POA: Diagnosis not present

## 2017-10-19 DIAGNOSIS — M48061 Spinal stenosis, lumbar region without neurogenic claudication: Secondary | ICD-10-CM | POA: Diagnosis not present

## 2017-10-19 DIAGNOSIS — J441 Chronic obstructive pulmonary disease with (acute) exacerbation: Secondary | ICD-10-CM | POA: Diagnosis not present

## 2017-10-24 DIAGNOSIS — I10 Essential (primary) hypertension: Secondary | ICD-10-CM | POA: Diagnosis not present

## 2017-10-24 DIAGNOSIS — J189 Pneumonia, unspecified organism: Secondary | ICD-10-CM | POA: Diagnosis not present

## 2017-10-24 DIAGNOSIS — M48061 Spinal stenosis, lumbar region without neurogenic claudication: Secondary | ICD-10-CM | POA: Diagnosis not present

## 2017-10-24 DIAGNOSIS — J441 Chronic obstructive pulmonary disease with (acute) exacerbation: Secondary | ICD-10-CM | POA: Diagnosis not present

## 2017-10-24 DIAGNOSIS — A419 Sepsis, unspecified organism: Secondary | ICD-10-CM | POA: Diagnosis not present

## 2017-10-24 DIAGNOSIS — J440 Chronic obstructive pulmonary disease with acute lower respiratory infection: Secondary | ICD-10-CM | POA: Diagnosis not present

## 2017-10-27 DIAGNOSIS — J440 Chronic obstructive pulmonary disease with acute lower respiratory infection: Secondary | ICD-10-CM | POA: Diagnosis not present

## 2017-10-27 DIAGNOSIS — M48061 Spinal stenosis, lumbar region without neurogenic claudication: Secondary | ICD-10-CM | POA: Diagnosis not present

## 2017-10-27 DIAGNOSIS — A419 Sepsis, unspecified organism: Secondary | ICD-10-CM | POA: Diagnosis not present

## 2017-10-27 DIAGNOSIS — I10 Essential (primary) hypertension: Secondary | ICD-10-CM | POA: Diagnosis not present

## 2017-10-27 DIAGNOSIS — J189 Pneumonia, unspecified organism: Secondary | ICD-10-CM | POA: Diagnosis not present

## 2017-10-27 DIAGNOSIS — J441 Chronic obstructive pulmonary disease with (acute) exacerbation: Secondary | ICD-10-CM | POA: Diagnosis not present

## 2017-10-30 ENCOUNTER — Ambulatory Visit (INDEPENDENT_AMBULATORY_CARE_PROVIDER_SITE_OTHER): Payer: Medicare Other | Admitting: Unknown Physician Specialty

## 2017-10-30 ENCOUNTER — Encounter: Payer: Self-pay | Admitting: Unknown Physician Specialty

## 2017-10-30 VITALS — BP 102/70 | HR 132 | Temp 98.0°F | Ht 61.5 in | Wt 98.1 lb

## 2017-10-30 DIAGNOSIS — J441 Chronic obstructive pulmonary disease with (acute) exacerbation: Secondary | ICD-10-CM | POA: Diagnosis not present

## 2017-10-30 DIAGNOSIS — F419 Anxiety disorder, unspecified: Secondary | ICD-10-CM

## 2017-10-30 DIAGNOSIS — R634 Abnormal weight loss: Secondary | ICD-10-CM

## 2017-10-30 DIAGNOSIS — E876 Hypokalemia: Secondary | ICD-10-CM | POA: Diagnosis not present

## 2017-10-30 DIAGNOSIS — M4120 Other idiopathic scoliosis, site unspecified: Secondary | ICD-10-CM | POA: Insufficient documentation

## 2017-10-30 DIAGNOSIS — M48061 Spinal stenosis, lumbar region without neurogenic claudication: Secondary | ICD-10-CM

## 2017-10-30 DIAGNOSIS — L989 Disorder of the skin and subcutaneous tissue, unspecified: Secondary | ICD-10-CM

## 2017-10-30 DIAGNOSIS — Z7689 Persons encountering health services in other specified circumstances: Secondary | ICD-10-CM | POA: Diagnosis not present

## 2017-10-30 DIAGNOSIS — I10 Essential (primary) hypertension: Secondary | ICD-10-CM | POA: Diagnosis not present

## 2017-10-30 DIAGNOSIS — R000 Tachycardia, unspecified: Secondary | ICD-10-CM | POA: Diagnosis not present

## 2017-10-30 DIAGNOSIS — H547 Unspecified visual loss: Secondary | ICD-10-CM

## 2017-10-30 DIAGNOSIS — D649 Anemia, unspecified: Secondary | ICD-10-CM | POA: Diagnosis not present

## 2017-10-30 DIAGNOSIS — I739 Peripheral vascular disease, unspecified: Secondary | ICD-10-CM | POA: Diagnosis not present

## 2017-10-30 HISTORY — DX: Abnormal weight loss: R63.4

## 2017-10-30 MED ORDER — CEFUROXIME AXETIL 250 MG PO TABS: 250.0000 mg | ORAL_TABLET | Freq: Two times a day (BID) | ORAL | 0 refills | Status: DC

## 2017-10-30 MED ORDER — BENAZEPRIL HCL 20 MG PO TABS: 20.0000 mg | ORAL_TABLET | Freq: Every day | ORAL | 3 refills | Status: DC

## 2017-10-30 MED ORDER — CLONAZEPAM 0.5 MG PO TABS: 0.5000 mg | ORAL_TABLET | Freq: Two times a day (BID) | ORAL | 1 refills | Status: DC | PRN

## 2017-10-30 NOTE — Assessment & Plan Note (Signed)
Low normal today.  Will decrease Lotensin to 20 mg.
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
## 2017-10-30 NOTE — Assessment & Plan Note (Addendum)
Encouraged use of inhalers for prevention of pneumonia.  States cough is increasing.  Ceftin if inhalers don't work.

## 2017-10-30 NOTE — Assessment & Plan Note (Signed)
Long term use of Clonazepam at night.  Will represcribe.

## 2017-10-30 NOTE — Assessment & Plan Note (Signed)
Refer to pain clinic for management.  Under the care of pain specialist in [place].

## 2017-10-30 NOTE — Assessment & Plan Note (Signed)
Discussed using nutritional supplements.  Not tolerant to these.  Discussed other nutritional supplements

## 2017-10-30 NOTE — Assessment & Plan Note (Signed)
Pt states she has pain from back.  She is also on Pletal and had an angioplasty

## 2017-10-30 NOTE — Assessment & Plan Note (Signed)
Pt states normal for her.  Failed beta blockers and calcium channel blockers

## 2017-10-31 ENCOUNTER — Emergency Department
Admission: EM | Admit: 2017-10-31 | Discharge: 2017-10-31 | Disposition: A | Discharge status: Home / Self Care | Payer: Medicare Other | Attending: Emergency Medicine | Admitting: Emergency Medicine

## 2017-10-31 ENCOUNTER — Other Ambulatory Visit: Payer: Self-pay

## 2017-10-31 ENCOUNTER — Encounter: Payer: Self-pay | Admitting: Emergency Medicine

## 2017-10-31 ENCOUNTER — Emergency Department: Payer: Medicare Other

## 2017-10-31 ENCOUNTER — Telehealth: Payer: Self-pay | Admitting: Unknown Physician Specialty

## 2017-10-31 DIAGNOSIS — F17210 Nicotine dependence, cigarettes, uncomplicated: Secondary | ICD-10-CM | POA: Diagnosis not present

## 2017-10-31 DIAGNOSIS — Z9981 Dependence on supplemental oxygen: Secondary | ICD-10-CM | POA: Diagnosis not present

## 2017-10-31 DIAGNOSIS — J441 Chronic obstructive pulmonary disease with (acute) exacerbation: Secondary | ICD-10-CM | POA: Diagnosis not present

## 2017-10-31 DIAGNOSIS — N3000 Acute cystitis without hematuria: Secondary | ICD-10-CM

## 2017-10-31 DIAGNOSIS — Z79899 Other long term (current) drug therapy: Secondary | ICD-10-CM | POA: Insufficient documentation

## 2017-10-31 DIAGNOSIS — I10 Essential (primary) hypertension: Secondary | ICD-10-CM | POA: Diagnosis not present

## 2017-10-31 DIAGNOSIS — R000 Tachycardia, unspecified: Secondary | ICD-10-CM | POA: Diagnosis not present

## 2017-10-31 DIAGNOSIS — H5711 Ocular pain, right eye: Secondary | ICD-10-CM | POA: Insufficient documentation

## 2017-10-31 DIAGNOSIS — A419 Sepsis, unspecified organism: Secondary | ICD-10-CM | POA: Diagnosis not present

## 2017-10-31 DIAGNOSIS — J189 Pneumonia, unspecified organism: Secondary | ICD-10-CM | POA: Diagnosis not present

## 2017-10-31 DIAGNOSIS — J449 Chronic obstructive pulmonary disease, unspecified: Secondary | ICD-10-CM | POA: Insufficient documentation

## 2017-10-31 DIAGNOSIS — H5789 Other specified disorders of eye and adnexa: Secondary | ICD-10-CM | POA: Diagnosis not present

## 2017-10-31 DIAGNOSIS — D72829 Elevated white blood cell count, unspecified: Secondary | ICD-10-CM | POA: Diagnosis present

## 2017-10-31 DIAGNOSIS — M48061 Spinal stenosis, lumbar region without neurogenic claudication: Secondary | ICD-10-CM | POA: Diagnosis not present

## 2017-10-31 DIAGNOSIS — J440 Chronic obstructive pulmonary disease with acute lower respiratory infection: Secondary | ICD-10-CM | POA: Diagnosis not present

## 2017-10-31 LAB — CBC WITH DIFFERENTIAL/PLATELET
BASOS ABS: 0 10*3/uL (ref 0–0.1)
BASOS: 0 %
Basophils Absolute: 0 10*3/uL (ref 0.0–0.2)
Basophils Relative: 0 %
EOS (ABSOLUTE): 0.1 10*3/uL (ref 0.0–0.4)
Eos: 1 %
Eosinophils Absolute: 0.1 10*3/uL (ref 0–0.7)
Eosinophils Relative: 1 %
HEMATOCRIT: 33.8 % — AB (ref 35.0–47.0)
HEMOGLOBIN: 11.1 g/dL — AB (ref 12.0–16.0)
Hematocrit: 36.9 % (ref 34.0–46.6)
Hemoglobin: 12.2 g/dL (ref 11.1–15.9)
Immature Grans (Abs): 0 10*3/uL (ref 0.0–0.1)
Immature Granulocytes: 0 %
LYMPHS ABS: 2.1 10*3/uL (ref 0.7–3.1)
LYMPHS ABS: 1.8 10*3/uL (ref 1.0–3.6)
Lymphocytes Relative: 13 %
Lymphs: 12 %
MCH: 31 pg (ref 26.6–33.0)
MCH: 31.1 pg (ref 26.0–34.0)
MCHC: 33.1 g/dL (ref 31.5–35.7)
MCHC: 32.8 g/dL (ref 32.0–36.0)
MCV: 94 fL (ref 79–97)
MCV: 94.8 fL (ref 80.0–100.0)
MONOS ABS: 0.9 10*3/uL (ref 0.1–0.9)
Monocytes Absolute: 0.8 10*3/uL (ref 0.2–0.9)
Monocytes Relative: 6 %
Monocytes: 5 %
NEUTROS ABS: 14.5 10*3/uL — AB (ref 1.4–7.0)
NEUTROS ABS: 11.3 10*3/uL — AB (ref 1.4–6.5)
Neutrophils Relative %: 80 %
Neutrophils: 82 %
PLATELETS: 479 10*3/uL — AB (ref 150–379)
Platelets: 412 10*3/uL (ref 150–440)
RBC: 3.93 x10E6/uL (ref 3.77–5.28)
RBC: 3.56 MIL/uL — AB (ref 3.80–5.20)
RDW: 13.3 % (ref 12.3–15.4)
RDW: 13.8 % (ref 11.5–14.5)
WBC: 17.6 10*3/uL — ABNORMAL HIGH (ref 3.4–10.8)
WBC: 14.1 10*3/uL — AB (ref 3.6–11.0)

## 2017-10-31 LAB — COMPREHENSIVE METABOLIC PANEL
A/G RATIO: 1.5 (ref 1.2–2.2)
ALT: 9 IU/L (ref 0–32)
AST: 13 IU/L (ref 0–40)
Albumin: 4.4 g/dL (ref 3.5–4.8)
Alkaline Phosphatase: 82 IU/L (ref 39–117)
BILIRUBIN TOTAL: 0.7 mg/dL (ref 0.0–1.2)
BUN/Creatinine Ratio: 16 (ref 12–28)
BUN: 11 mg/dL (ref 8–27)
CHLORIDE: 97 mmol/L (ref 96–106)
CO2: 22 mmol/L (ref 20–29)
Calcium: 9.4 mg/dL (ref 8.7–10.3)
Creatinine, Ser: 0.7 mg/dL (ref 0.57–1.00)
GFR calc non Af Amer: 84 mL/min/{1.73_m2} (ref >59)
GFR, EST AFRICAN AMERICAN: 97 mL/min/{1.73_m2} (ref >59)
Globulin, Total: 2.9 g/dL (ref 1.5–4.5)
Glucose: 84 mg/dL (ref 65–99)
POTASSIUM: 4.3 mmol/L (ref 3.5–5.2)
Sodium: 139 mmol/L (ref 134–144)
TOTAL PROTEIN: 7.3 g/dL (ref 6.0–8.5)

## 2017-10-31 LAB — BASIC METABOLIC PANEL
ANION GAP: 10 (ref 5–15)
BUN: 17 mg/dL (ref 6–20)
CHLORIDE: 101 mmol/L (ref 101–111)
CO2: 26 mmol/L (ref 22–32)
Calcium: 8.9 mg/dL (ref 8.9–10.3)
Creatinine, Ser: 0.87 mg/dL (ref 0.44–1.00)
GFR calc Af Amer: >60 mL/min (ref >60)
GFR calc non Af Amer: >60 mL/min (ref >60)
GLUCOSE: 126 mg/dL — AB (ref 65–99)
POTASSIUM: 3.4 mmol/L — AB (ref 3.5–5.1)
Sodium: 137 mmol/L (ref 135–145)

## 2017-10-31 LAB — URINALYSIS, COMPLETE (UACMP) WITH MICROSCOPIC
Bilirubin Urine: NEGATIVE
GLUCOSE, UA: NEGATIVE mg/dL
HGB URINE DIPSTICK: NEGATIVE
KETONES UR: NEGATIVE mg/dL
NITRITE: NEGATIVE
PH: 5 (ref 5.0–8.0)
Protein, ur: NEGATIVE mg/dL
Specific Gravity, Urine: 1.02 (ref 1.005–1.030)

## 2017-10-31 LAB — TROPONIN I: Troponin I: <0.03 ng/mL (ref <0.03)

## 2017-10-31 LAB — TSH: TSH: 3.05 u[IU]/mL (ref 0.450–4.500)

## 2017-10-31 LAB — INFLUENZA PANEL BY PCR (TYPE A & B)
Influenza A By PCR: NEGATIVE
Influenza B By PCR: NEGATIVE

## 2017-10-31 MED ORDER — CEPHALEXIN 500 MG PO CAPS: 500.0000 mg | ORAL_CAPSULE | Freq: Four times a day (QID) | ORAL | 0 refills | Status: DC

## 2017-10-31 MED ORDER — SODIUM CHLORIDE 0.9 % IV SOLN
1.0000 g | Freq: Once | INTRAVENOUS | Status: AC
  Administered 2017-10-31: 1 g via INTRAVENOUS
  Filled 2017-10-31: qty 10

## 2017-10-31 MED ORDER — DILTIAZEM HCL 60 MG PO TABS
ORAL_TABLET | ORAL | Status: AC
  Administered 2017-10-31: 30 mg via ORAL
  Filled 2017-10-31: qty 1

## 2017-10-31 MED ORDER — DILTIAZEM HCL 30 MG PO TABS
30.0000 mg | ORAL_TABLET | Freq: Once | ORAL | Status: AC
  Administered 2017-10-31: 30 mg via ORAL

## 2017-10-31 MED ORDER — OXYCODONE HCL 5 MG PO TABS
5.0000 mg | ORAL_TABLET | Freq: Once | ORAL | Status: AC
  Administered 2017-10-31: 5 mg via ORAL
  Filled 2017-10-31: qty 1

## 2017-10-31 NOTE — Telephone Encounter (Signed)
Called and notified the charge nurse, Colletta [place], of patient coming over via private vehicle per Kathrine Haddock, FNP.

## 2017-10-31 NOTE — ED Notes (Signed)
See triage note  States she was sent in b/c of elevated WBC   Went to PCP for re-eval  And had some blood drawn  Was told to f/u in ED for same  Also states she has been having some sinus pressure and drainage  loe grade temp on arrival

## 2017-10-31 NOTE — ED Notes (Signed)
Pt taken to lobby with voucher for cab. Tried calling daughter, son in law, son, daughter in Sports coach. Tried dowloading [employer] to her phone no transport for pt. VSS. NAD. Discharge instructions, and follow up discussed with patient.

## 2017-10-31 NOTE — ED Triage Notes (Signed)
Pt reports that she went to her PMD, for tachycardia that she is no longer having, they took her blood work and noticed that her WBC were elevated and they told her to come to the ER.

## 2017-10-31 NOTE — Telephone Encounter (Signed)
Discussed elevated WBC with her daughter.  Pt feeling poorly and unable to get out of bed.  Will bring to the ER ASAP

## 2017-10-31 NOTE — Discharge Instructions (Signed)
Please take the entire course of antibiotics, even if you are feeling better.  Drink plenty of fluids to stay well-hydrated and to help clear your urine infection.  Please make a follow-up appoint with your primary care physician in 3-4 days.  Return to the emergency department if you develop severe pain, lightheadedness or fainting, inability to keep down fluids, fever, or any other symptoms concerning to you.

## 2017-10-31 NOTE — ED Notes (Signed)
Called and left message for pt's daughter per her request. Pt states she has no other way home and that they live an hour away.

## 2017-10-31 NOTE — ED Notes (Signed)
Pt ambulatory to toilet, used cane.

## 2017-10-31 NOTE — ED Provider Notes (Signed)
[facility] Emergency Department Provider Note  ____________________________________________  Time seen: Approximately 4:45 PM  I have reviewed the triage vital signs and the nursing notes.   HISTORY  Chief Complaint Abnormal Lab    HPI Savannah Mcclure is a 77 y.o. female history of COPD on supplemental O2 at night presenting for abnormal white blood cell count.  The patient reports that she was seen by her primary care physician for routine screening yesterday, and was called today after her physician told her her white blood cell count was 17,000.  The patient is not currently taking antibiotics.  She was last hospitalized in February for pneumonia with sepsis.  Over the past couple of weeks, the patient reports a change in her phlegm, now thick compared to the clear sputum she is used to.  She has not had a change in her shortness of breath or the frequency or character of her cough.  Yesterday, she does report some chills and subjective fever but did not take her temperature.  She has not had any congestion or rhinorrhea, ear pain, sore throat, or dysuria.  No nausea vomiting or diarrhea.  She was started on cefuroxime yesterday after seeing her PMD and has had one dose this morning.  She is also concerned about some recent weight loss, although states that she is eating and drinking normally.  In addition, she has a history of right eye aneurysm and MRSA infection in 2018, and has some mild burning sensation in the right eye without any discharge.  She has an appointment to see the ophthalmologist within the next 7 days.  Past Medical History:  Diagnosis Date  . Aneurysm ([place])    right eye  . Blind right eye   . Cardiac arrest ([place])   . Cataract    right eye  . COPD (chronic obstructive pulmonary disease) ([place])   . Hypertension   . Scoliosis   . Sepsis ([place])   . Spinal stenosis     Patient Active Problem List   Diagnosis Date Noted  . Scoliosis  associated with other condition 10/30/2017  . PAD (peripheral artery disease) ([place]) 10/30/2017  . Skin lesions 10/30/2017  . Vision loss 10/30/2017  . Tachycardia 10/30/2017  . Anxiety 10/30/2017  . Anemia 10/30/2017  . Hypokalemia 10/30/2017  . Abnormal weight loss 10/30/2017  . HTN (hypertension) 10/07/2017  . COPD with acute exacerbation ([place]) 10/07/2017  . Sepsis ([place]) 08/25/2017  . 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 History of tobacco abuse 07/25/2011    Past Surgical History:  Procedure Laterality Date  . ABDOMINAL HYSTERECTOMY     total  . BACK SURGERY    . BREAST LUMPECTOMY Left   . right eye surgery    . WISDOM TOOTH EXTRACTION      Current Outpatient Rx  . Order #: [id number] Class: Historical Med  . Order #: [id number] Class: Normal  . Order #: [id number] Class: Normal  . Order #: [id number] Class: Historical Med  . Order #: [id number] Class: Print  . Order #: [id number] Class: Print  . Order #: [id number] Class: Print  . Order #: [id number] Class: Historical Med  . Order #: [id number] Class: Print  . Order #: [id number] Class: Historical Med  . Order #: [id number] Class: Historical Med  . Order #: [id number] Class: Historical Med  . Order #: [id number] Class: Historical Med  . Order #: [id number] Class: Print  . Order #: [id number] Class: Historical Med  . Order #: [id number] Class: Historical Med  . Order #: [id number] Class: Historical  Med    Allergies Other; Amlodipine; Azithromycin; Doxycycline; Erythromycin; Hydrocodone; Lactose intolerance (gi); Minocycline; Nsaids; Relafen [nabumetone]; Sulfa antibiotics; and Tetracyclines & related  Family History  Problem Relation Age of Onset  . Intracerebral hemorrhage Mother   . Hypertension Mother   . Heart failure Father   . Schizophrenia Daughter   . Rheum arthritis Maternal Grandmother   . Kidney disease Maternal Grandmother   . Heart disease Paternal Grandmother   . Stroke Paternal  Grandmother   . Stroke Paternal Grandfather   . Heart disease Paternal Grandfather     Social History Social History   Tobacco Use  . Smoking status: Current Every Day Smoker    Packs/day: 0.50  . Smokeless tobacco: Never Used  Substance Use Topics  . Alcohol use: Yes    Frequency: Never    Comment: wine on occasion  . Drug use: No    Review of Systems Constitutional: Active chills with subjective fever.  Positive weight loss.  Positive general malaise. Eyes: Chronic visual loss in the right eye.  Positive burning sensation in the right eye without discharge. ENT: No sore throat. No congestion or rhinorrhea. Cardiovascular: Denies chest pain. Denies palpitations. Respiratory: Denies shortness of breath.  Positive chronic cough with change in the character of her phlegm. Gastrointestinal: No abdominal pain.  No nausea, no vomiting.  No diarrhea.  No constipation. Genitourinary: Negative for dysuria.  No urinary frequency.  No hematuria. Musculoskeletal: Positive for chronic unchanged back pain. Skin: Negative for rash. Neurological: Negative for headaches. No focal numbness, tingling or weakness.     ____________________________________________   PHYSICAL EXAM:  VITAL SIGNS: ED Triage Vitals  Enc Vitals Group     BP 10/31/17 1251 107/66     Pulse Rate 10/31/17 1251 (!) 130     Resp 10/31/17 1251 16     Temp 10/31/17 1251 99.2 F (37.3 C)     Temp Source 10/31/17 1251 Oral     SpO2 10/31/17 1251 100 %     Weight 10/31/17 1252 98 lb (44.5 kg)     Height 10/31/17 1252 5\' 1"  (1.549 m)     Head Circumference --      Peak Flow --      Pain Score 10/31/17 1255 0     Pain Loc --      Pain Edu? --      Excl. in [place]? --     Constitutional: The patient is alert and oriented.  She is chronically ill-appearing but nontoxic. Eyes: Conjunctivae is normal in the left eye and minimally erythematous in the right without any discharge, significant tearing.  The right pupil is  opaque.  EOMI. No scleral icterus. Head: Atraumatic. Nose: No congestion/rhinnorhea. Mouth/Throat: Mucous membranes are moist.  Neck: No stridor.  Supple.  No JVD.  No meningismus. Cardiovascular: Fast rate, regular rhythm. No murmurs, rubs or gallops.  Respiratory: Normal respiratory effort.  No accessory muscle use or retractions.  Prolonged expiratory phase.  Lungs CTAB.  No wheezes, rales or ronchi. Gastrointestinal: Soft, nontender and nondistended.  No guarding or rebound.  No peritoneal signs. Musculoskeletal: No LE edema. No ttp in the calves or palpable cords.  Negative Homan's sign. Neurologic:  A&Ox3.  Speech is clear.  Face and smile are symmetric.  EOMI.  Moves all extremities well. Skin:  Skin is warm, dry and intact. No rash noted. Psychiatric: Depressed mood and affect.  ____________________________________________   LABS (all labs ordered are listed, but only abnormal results are  displayed)  Labs Reviewed  CBC WITH DIFFERENTIAL/PLATELET - Abnormal; Notable for the following components:      Result Value   WBC 14.1 (*)    RBC 3.56 (*)    Hemoglobin 11.1 (*)    HCT 33.8 (*)    Neutro Abs 11.3 (*)    All other components within normal limits  BASIC METABOLIC PANEL - Abnormal; Notable for the following components:   Potassium 3.4 (*)    Glucose, Bld 126 (*)    All other components within normal limits  URINALYSIS, COMPLETE (UACMP) WITH MICROSCOPIC - Abnormal; Notable for the following components:   Color, Urine AMBER (*)    APPearance CLOUDY (*)    Leukocytes, UA TRACE (*)    Bacteria, UA RARE (*)    Squamous Epithelial / LPF 0-5 (*)    All other components within normal limits  URINE CULTURE  TROPONIN I  INFLUENZA PANEL BY PCR (TYPE A & B)   ____________________________________________  EKG  ED ECG REPORT I, Eula Listen, the attending physician, personally viewed and interpreted this ECG.   Date: 10/31/2017  EKG Time: 1257  Rate: 135   Rhythm: sinus tachycardia; RBBB  Axis: leftward  Intervals:none  ST&T Change: No STEMI  This is a grossly abnormal EKG but morphologically unchanged from 10/07/17, including tachycardia.  ____________________________________________  RADIOLOGY  Dg Chest 2 View  Result Date: 10/31/2017 CLINICAL DATA:  COPD.  Hypertension. EXAM: CHEST - 2 VIEW COMPARISON:  10/07/2017.  08/25/2017.  09/10/2012. FINDINGS: Mediastinum and hilar structures normal. Stable right upper lobe nodular opacities and pleural thickening. Chronic interstitial changes present bilaterally. Heart size normal. Thoracic spine scoliosis. Surgical clips upper abdomen. IMPRESSION: Stable right upper lobe nodular opacities and pleural thickening. No acute infiltrate. Electronically Signed   By: Marcello Moores  Register   On: 10/31/2017 14:02    ____________________________________________   PROCEDURES  Procedure(s) performed: None  Procedures  Critical Care performed: No ____________________________________________   INITIAL IMPRESSION / ASSESSMENT AND PLAN / ED COURSE  Pertinent labs & imaging results that were available during my care of the patient were reviewed by me and considered in my medical decision making (see chart for details).  77 y.o. female sent to the emergency department for elevated white blood cell count.  Overall, the patient does have tachycardia, which is unchanged compared to prior but we will reevaluate her heart rate.  She has not been experiencing any chest pain or shortness of breath so ACS or MI is very unlikely.  It is possible that she has exertional related tachycardia since this EKG was done in triage and we will repeat her heart rate now that she is at rest.  The patient does not have any focal infiltrate on her chest x-ray, nor does she have any abnormalities in her pulmonary auscultation other than a prolonged expiratory rate which is consistent with her chronic COPD.  She has Artie been initiated  on cefuroxime to cover for possible pulmonary pathogens and given that her oxygen saturation is 100% on room air, there is no indication for further imaging, nor acute hospitalization.  The patient's white blood cell count is 14,000 here, and she is consistently elevated and her prior WBC counts.  There is no evidence for a life-threatening infection at this time and she is being treated conservatively with antibiotics for possible pulmonary infection.  She is already on Advair and albuterol as well as supplemental oxygen at night at home.  We will Do influenza testing and urinalysis,  and reevaluate the patient for final disposition.  ----------------------------------------- 6:25 PM on 10/31/2017 -----------------------------------------  The patient's workup in the emergency department has been reassuring.  Her influenza testing is negative and she does not have any pneumonia on her chest x-ray.  She continues to have oxygen saturations of greater than 98% on room air.  I am awaiting the results of her urinalysis.  She continues to be tachycardic although this is a chronic issue for her and she takes diltiazem as needed for tachycardia.  We will give her her normal as needed dose at this time.  Plan reevaluation for final disposition.  ----------------------------------------- 7:20 PM on 10/31/2017 -----------------------------------------  The patient does have some bacteria and white blood cells as well as trace leukocyte esterase in her urine.  A urine culture has been sent and she will receive a dose of Rocephin here.  She has already been prescribed cefuroxime for pulmonary infection, and this should cover her urinary pathogen as well.  We will reevaluate her heart rate, but I anticipate the patient will be discharged home.  I have encouraged her to have a close follow-up with her PMD and given specific instructions for return precautions if she is not  improved.  ____________________________________________  FINAL CLINICAL IMPRESSION(S) / ED DIAGNOSES  Final diagnoses:  Acute cystitis without hematuria  Sinus tachycardia  Eye discomfort, right         NEW MEDICATIONS STARTED DURING THIS VISIT:  Current Discharge Medication List        Eula Listen, MD 10/31/17 1921

## 2017-11-02 LAB — URINE CULTURE: CULTURE: NO GROWTH

## 2017-11-03 DIAGNOSIS — I10 Essential (primary) hypertension: Secondary | ICD-10-CM | POA: Diagnosis not present

## 2017-11-03 DIAGNOSIS — J441 Chronic obstructive pulmonary disease with (acute) exacerbation: Secondary | ICD-10-CM | POA: Diagnosis not present

## 2017-11-03 DIAGNOSIS — J440 Chronic obstructive pulmonary disease with acute lower respiratory infection: Secondary | ICD-10-CM | POA: Diagnosis not present

## 2017-11-03 DIAGNOSIS — J189 Pneumonia, unspecified organism: Secondary | ICD-10-CM | POA: Diagnosis not present

## 2017-11-03 DIAGNOSIS — M48061 Spinal stenosis, lumbar region without neurogenic claudication: Secondary | ICD-10-CM | POA: Diagnosis not present

## 2017-11-03 DIAGNOSIS — A419 Sepsis, unspecified organism: Secondary | ICD-10-CM | POA: Diagnosis not present

## 2017-11-07 DIAGNOSIS — M48061 Spinal stenosis, lumbar region without neurogenic claudication: Secondary | ICD-10-CM | POA: Diagnosis not present

## 2017-11-07 DIAGNOSIS — I10 Essential (primary) hypertension: Secondary | ICD-10-CM | POA: Diagnosis not present

## 2017-11-07 DIAGNOSIS — J440 Chronic obstructive pulmonary disease with acute lower respiratory infection: Secondary | ICD-10-CM | POA: Diagnosis not present

## 2017-11-07 DIAGNOSIS — J189 Pneumonia, unspecified organism: Secondary | ICD-10-CM | POA: Diagnosis not present

## 2017-11-07 DIAGNOSIS — A419 Sepsis, unspecified organism: Secondary | ICD-10-CM | POA: Diagnosis not present

## 2017-11-07 DIAGNOSIS — J441 Chronic obstructive pulmonary disease with (acute) exacerbation: Secondary | ICD-10-CM | POA: Diagnosis not present

## 2017-11-10 DIAGNOSIS — I10 Essential (primary) hypertension: Secondary | ICD-10-CM | POA: Diagnosis not present

## 2017-11-10 DIAGNOSIS — J441 Chronic obstructive pulmonary disease with (acute) exacerbation: Secondary | ICD-10-CM | POA: Diagnosis not present

## 2017-11-10 DIAGNOSIS — J440 Chronic obstructive pulmonary disease with acute lower respiratory infection: Secondary | ICD-10-CM | POA: Diagnosis not present

## 2017-11-10 DIAGNOSIS — J189 Pneumonia, unspecified organism: Secondary | ICD-10-CM | POA: Diagnosis not present

## 2017-11-10 DIAGNOSIS — M48061 Spinal stenosis, lumbar region without neurogenic claudication: Secondary | ICD-10-CM | POA: Diagnosis not present

## 2017-11-10 DIAGNOSIS — A419 Sepsis, unspecified organism: Secondary | ICD-10-CM | POA: Diagnosis not present

## 2017-11-20 ENCOUNTER — Ambulatory Visit (INDEPENDENT_AMBULATORY_CARE_PROVIDER_SITE_OTHER): Payer: Medicare Other

## 2017-11-20 ENCOUNTER — Encounter: Payer: Self-pay | Admitting: Family Medicine

## 2017-11-20 ENCOUNTER — Ambulatory Visit: Payer: Medicare Other | Admitting: Unknown Physician Specialty

## 2017-11-20 ENCOUNTER — Ambulatory Visit (INDEPENDENT_AMBULATORY_CARE_PROVIDER_SITE_OTHER): Payer: Medicare Other | Admitting: Family Medicine

## 2017-11-20 VITALS — BP 110/60 | HR 88 | Temp 98.7°F | Ht 61.0 in | Wt 98.4 lb

## 2017-11-20 VITALS — BP 110/60 | HR 88 | Temp 98.0°F | Resp 18 | Ht 61.0 in | Wt 98.7 lb

## 2017-11-20 DIAGNOSIS — Z0000 Encounter for general adult medical examination without abnormal findings: Secondary | ICD-10-CM | POA: Diagnosis not present

## 2017-11-20 DIAGNOSIS — J449 Chronic obstructive pulmonary disease, unspecified: Secondary | ICD-10-CM | POA: Diagnosis not present

## 2017-11-20 DIAGNOSIS — M48061 Spinal stenosis, lumbar region without neurogenic claudication: Secondary | ICD-10-CM | POA: Diagnosis not present

## 2017-11-20 DIAGNOSIS — R011 Cardiac murmur, unspecified: Secondary | ICD-10-CM | POA: Diagnosis not present

## 2017-11-20 MED ORDER — IPRATROPIUM-ALBUTEROL 0.5-2.5 (3) MG/3ML IN SOLN: 3.0000 mL | Freq: Four times a day (QID) | RESPIRATORY_TRACT | 6 refills | Status: DC | PRN

## 2017-11-20 MED ORDER — LIDOCAINE 5 % EX PTCH: 1.0000 | MEDICATED_PATCH | CUTANEOUS | 6 refills | Status: DC
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
## 2017-11-20 NOTE — Patient Instructions (Addendum)
Savannah Mcclure , Thank you for taking time to come for your Medicare Wellness Visit. I appreciate your ongoing commitment to your health goals. Please review the following plan we discussed and let me know if I can assist you in the future.   Screening recommendations/referrals: Colonoscopy: no longer required Mammogram: no longer required Bone Density: up to date Recommended yearly ophthalmology/optometry visit for glaucoma screening and checkup Recommended yearly dental visit for hygiene and checkup  Vaccinations: Influenza vaccine: up to date  Pneumococcal vaccine: defer to next appointment when your feeling better  Tdap vaccine: due, check with your insurance company for coverage Shingles vaccine: up to date   Advanced directives: Please bring a copy of your health care power of attorney and living will to the office at your convenience.   Conditions/risks identified: smoking cessation discussed, Burgess Estelle, RN will call you to discuss lung cancer screening.   Next appointment: Follow up in one year for your annual wellness exam.    Preventive Care 65 Years and Older, Female Preventive care refers to lifestyle choices and visits with your health care provider that can promote health and wellness. What does preventive care include?  A yearly physical exam. This is also called an annual well check.  Dental exams once or twice a year.  Routine eye exams. Ask your health care provider how often you should have your eyes checked.  Personal lifestyle choices, including:  Daily care of your teeth and gums.  Regular physical activity.  Eating a healthy diet.  Avoiding tobacco and drug use.  Limiting alcohol use.  Practicing safe sex.  Taking low-dose aspirin every day.  Taking vitamin and mineral supplements as recommended by your health care provider. What happens during an annual well check? The services and screenings done by your health care provider during  your annual well check will depend on your age, overall health, lifestyle risk factors, and family history of disease. Counseling  Your health care provider may ask you questions about your:  Alcohol use.  Tobacco use.  Drug use.  Emotional well-being.  Home and relationship well-being.  Sexual activity.  Eating habits.  History of falls.  Memory and ability to understand (cognition).  Work and work Statistician.  Reproductive health. Screening  You may have the following tests or measurements:  Height, weight, and BMI.  Blood pressure.  Lipid and cholesterol levels. These may be checked every 5 years, or more frequently if you are over 49 years old.  Skin check.  Lung cancer screening. You may have this screening every year starting at age 63 if you have a 30-pack-year history of smoking and currently smoke or have quit within the past 15 years.  Fecal occult blood test (FOBT) of the stool. You may have this test every year starting at age 63.  Flexible sigmoidoscopy or colonoscopy. You may have a sigmoidoscopy every 5 years or a colonoscopy every 10 years starting at age 56.  Hepatitis C blood test.  Hepatitis B blood test.  Sexually transmitted disease (STD) testing.  Diabetes screening. This is done by checking your blood sugar (glucose) after you have not eaten for a while (fasting). You may have this done every 1-3 years.  Bone density scan. This is done to screen for osteoporosis. You may have this done starting at age 57.  Mammogram. This may be done every 1-2 years. Talk to your health care provider about how often you should have regular mammograms. Talk with your health care provider  about your test results, treatment options, and if necessary, the need for more tests. Vaccines  Your health care provider may recommend certain vaccines, such as:  Influenza vaccine. This is recommended every year.  Tetanus, diphtheria, and acellular pertussis (Tdap,  Td) vaccine. You may need a Td booster every 10 years.  Zoster vaccine. You may need this after age 7.  Pneumococcal 13-valent conjugate (PCV13) vaccine. One dose is recommended after age 78.  Pneumococcal polysaccharide (PPSV23) vaccine. One dose is recommended after age 69. Talk to your health care provider about which screenings and vaccines you need and how often you need them. This information is not intended to replace advice given to you by your health care provider. Make sure you discuss any questions you have with your health care provider. Document Released: 08/28/2015 Document Revised: 04/20/2016 Document Reviewed: 06/02/2015 Elsevier Interactive Patient Education  2017 [employer] Prevention in the Home Falls can cause injuries. They can happen to people of all ages. There are many things you can do to make your home safe and to help prevent falls. What can I do on the outside of my home?  Regularly fix the edges of walkways and driveways and fix any cracks.  Remove anything that might make you trip as you walk through a door, such as a raised step or threshold.  Trim any bushes or trees on the path to your home.  Use bright outdoor lighting.  Clear any walking paths of anything that might make someone trip, such as rocks or tools.  Regularly check to see if handrails are loose or broken. Make sure that both sides of any steps have handrails.  Any raised decks and porches should have guardrails on the edges.  Have any leaves, snow, or ice cleared regularly.  Use sand or salt on walking paths during winter.  Clean up any spills in your garage right away. This includes oil or grease spills. What can I do in the bathroom?  Use night lights.  Install grab bars by the toilet and in the tub and shower. Do not use towel bars as grab bars.  Use non-skid mats or decals in the tub or shower.  If you need to sit down in the shower, use a plastic, non-slip  stool.  Keep the floor dry. Clean up any water that spills on the floor as soon as it happens.  Remove soap buildup in the tub or shower regularly.  Attach bath mats securely with double-sided non-slip rug tape.  Do not have throw rugs and other things on the floor that can make you trip. What can I do in the bedroom?  Use night lights.  Make sure that you have a light by your bed that is easy to reach.  Do not use any sheets or blankets that are too big for your bed. They should not hang down onto the floor.  Have a firm chair that has side arms. You can use this for support while you get dressed.  Do not have throw rugs and other things on the floor that can make you trip. What can I do in the kitchen?  Clean up any spills right away.  Avoid walking on wet floors.  Keep items that you use a lot in easy-to-reach places.  If you need to reach something above you, use a strong step stool that has a grab bar.  Keep electrical cords out of the way.  Do not use floor polish or  wax that makes floors slippery. If you must use wax, use non-skid floor wax.  Do not have throw rugs and other things on the floor that can make you trip. What can I do with my stairs?  Do not leave any items on the stairs.  Make sure that there are handrails on both sides of the stairs and use them. Fix handrails that are broken or loose. Make sure that handrails are as long as the stairways.  Check any carpeting to make sure that it is firmly attached to the stairs. Fix any carpet that is loose or worn.  Avoid having throw rugs at the top or bottom of the stairs. If you do have throw rugs, attach them to the floor with carpet tape.  Make sure that you have a light switch at the top of the stairs and the bottom of the stairs. If you do not have them, ask someone to add them for you. What else can I do to help prevent falls?  Wear shoes that:  Do not have high heels.  Have rubber bottoms.  Are  comfortable and fit you well.  Are closed at the toe. Do not wear sandals.  If you use a stepladder:  Make sure that it is fully opened. Do not climb a closed stepladder.  Make sure that both sides of the stepladder are locked into place.  Ask someone to hold it for you, if possible.  Clearly mark and make sure that you can see:  Any grab bars or handrails.  First and last steps.  Where the edge of each step is.  Use tools that help you move around (mobility aids) if they are needed. These include:  Canes.  Walkers.  Scooters.  Crutches.  Turn on the lights when you go into a dark area. Replace any light bulbs as soon as they burn out.  Set up your furniture so you have a clear path. Avoid moving your furniture around.  If any of your floors are uneven, fix them.  If there are any pets around you, be aware of where they are.  Review your medicines with your doctor. Some medicines can make you feel dizzy. This can increase your chance of falling. Ask your doctor what other things that you can do to help prevent falls. This information is not intended to replace advice given to you by your health care provider. Make sure you discuss any questions you have with your health care provider. Document Released: 05/28/2009 Document Revised: 01/07/2016 Document Reviewed: 09/05/2014 Elsevier Interactive Patient Education  2017 [employer].

## 2017-11-20 NOTE — Assessment & Plan Note (Addendum)
Lungs clear today. Will change to trelegy to see if it works better for her. Sample given. Will obtain x-ray to r/o pneumonia. Await results. Return in 1 week with spiro. Duonebs every 4 hours for now.

## 2017-11-20 NOTE — Progress Notes (Signed)
Subjective:   Savannah Mcclure is a 77 y.o. female who presents for Medicare Annual (Subsequent) preventive examination.  Review of Systems:   Cardiac Risk Factors include: hypertension;advanced age (>13men, >42 women);smoking/ tobacco exposure     Objective:     Vitals: BP 110/60 (BP Location: Left Arm, Patient Position: Sitting)   Pulse 88   Temp 98 F (36.7 C) (Temporal)   Resp 18   Ht 5\' 1"  (1.549 m)   Wt 98 lb 11.2 oz (44.8 kg)   BMI 18.65 kg/m   Body mass index is 18.65 kg/m.  Advanced Directives 10/31/2017 10/08/2017 10/07/2017 08/25/2017 08/25/2017  Does Patient Have a Medical Advance Directive? No No No Yes Yes  Type of Advance Directive - - - Living will Living will  Does patient want to make changes to medical advance directive? - - - No - Patient declined Yes (ED - Information included in AVS)  Copy of [place] in Chart? - - - Yes No - copy requested  Would patient like information on creating a medical advance directive? No - Patient declined No - Patient declined No - Patient declined - -    Tobacco Social History   Tobacco Use  Smoking Status Current Every Day Smoker  . Packs/day: 0.50  Smokeless Tobacco Never Used     Ready to quit: Yes Counseling given: Yes   Clinical Intake:  Pre-visit preparation completed: Yes  Pain : 0-10 Pain Score: 3  Pain Type: Chronic pain Pain Location: Back Pain Orientation: Lower Pain Descriptors / Indicators: Aching Pain Onset: More than a month ago Pain Frequency: Constant     Nutritional Status: BMI <19  Underweight Nutritional Risks: Nausea/ vomitting/ diarrhea(vomiting recently due to cough ) Diabetes: No  How often do you need to have someone help you when you read instructions, pamphlets, or other written materials from your doctor or pharmacy?: 1 - Never What is the last grade level you completed in school?: college   Interpreter Needed?: No  Information entered by ::  Pauleen Goleman,LPN   Past Medical History:  Diagnosis Date  . 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 Blind right eye   . Cardiac arrest ([place])   . Cataract    right eye  . COPD (chronic obstructive pulmonary disease) ([place])   . Hypertension   . Scoliosis   . Sepsis ([place])   . Spinal stenosis    Past Surgical History:  Procedure Laterality Date  . ABDOMINAL HYSTERECTOMY     total  . BACK SURGERY    . BREAST LUMPECTOMY Left   . right eye surgery    . WISDOM TOOTH EXTRACTION     Family History  Problem Relation Age of Onset  . Intracerebral hemorrhage Mother   . Hypertension Mother   . Heart failure Father   . Schizophrenia Daughter   . Rheum arthritis Maternal Grandmother   . Kidney disease Maternal Grandmother   . Heart disease Paternal Grandmother   . Stroke Paternal Grandmother   . Stroke Paternal Grandfather   . Heart disease Paternal Grandfather    Social History   Socioeconomic History  . Marital status: Widowed    Spouse name: Not on file  . Number of children: Not on file  . Years of education: Not on file  . Highest education level: Not on file  Occupational History  . Not on file  Social Needs  . Financial resource strain: Not very hard  .  Food insecurity:    Worry: Never true    Inability: Never true  . Transportation needs:    Medical: No    Non-medical: No  Tobacco Use  . Smoking status: Current Every Day Smoker    Packs/day: 0.50  . Smokeless tobacco: Never Used  Substance and Sexual Activity  . Alcohol use: Yes    Frequency: Never    Comment: wine on occasion  . Drug use: No  . Sexual activity: Not on file  Lifestyle  . Physical activity:    Days per week: 0 days    Minutes per session: 0 min  . Stress: Not at all  Relationships  . Social connections:    Talks on phone: Once a week    Gets together: Never    Attends religious service: Never    Active member of club or organization: No    Attends meetings of clubs or organizations: Never     Relationship status: Widowed  Other Topics Concern  . Not on file  Social History Narrative  . Not on file    Outpatient Encounter Medications as of 11/20/2017  Medication Sig  . B-COMPLEX-C PO Take 1 tablet by mouth daily.  . benazepril (LOTENSIN) 20 MG tablet Take 1 tablet (20 mg total) by mouth daily.  . cilostazol (PLETAL) 100 MG tablet Take 100 mg by mouth 2 (two) times daily.  . clonazePAM (KLONOPIN) 0.5 MG tablet Take 1 tablet (0.5 mg total) by mouth 2 (two) times daily as needed for anxiety.  Marland Kitchen diltiazem (CARDIZEM) 30 MG tablet Take 1 tablet (30 mg total) by mouth 3 (three) times daily as needed (Tachycardia HR > 130.).  Marland Kitchen gabapentin (NEURONTIN) 100 MG capsule Take 100 mg by mouth 2 (two) times daily.  Marland Kitchen ipratropium-albuterol (DUONEB) 0.5-2.5 (3) MG/3ML SOLN Take 3 mLs by nebulization every 6 (six) hours as needed.  . lidocaine (LIDODERM) 5 % Place 1 patch onto the skin daily. Remove & Discard patch within 12 hours or as directed by MD  . loperamide (ANTI-DIARRHEAL) 2 MG tablet Take 2 mg by mouth 4 (four) times daily as needed for diarrhea or loose stools.  . Multiple Vitamins-Minerals (MULTIVITAMIN WITH MINERALS) tablet Take 1 tablet by mouth daily.  Marland Kitchen oxyCODONE (OXY IR/ROXICODONE) 5 MG immediate release tablet Take 5 mg by mouth every 6 (six) hours as needed for severe pain.  Marland Kitchen tiotropium (SPIRIVA HANDIHALER) 18 MCG inhalation capsule Place 1 capsule (18 mcg total) into inhaler and inhale daily.  Marland Kitchen UNABLE TO FIND Take 1 tablet by mouth 2 (two) times daily. Cardio FX  . UNABLE TO FIND Take 1 tablet by mouth daily. Cinnergy  . UNABLE TO FIND Take 2 tablets by mouth daily. Joint FX  . Fluticasone-Salmeterol (ADVAIR DISKUS) 250-50 MCG/DOSE AEPB Inhale 1 puff into the lungs 2 (two) times daily. (Patient not taking: Reported on 11/20/2017)  . [DISCONTINUED] cefUROXime (CEFTIN) 250 MG tablet Take 1 tablet (250 mg total) by mouth 2 (two) times daily with a meal. (Patient not taking: Reported  on 11/20/2017)   No facility-administered encounter medications on file as of 11/20/2017.     Activities of Daily Living In your present state of health, do you have any difficulty performing the following activities: 11/20/2017 10/30/2017  Hearing? N N  Vision? Y Y  Comment blind in right eye  right blindness  Difficulty concentrating or making decisions? N Y  Comment - remembering  Walking or climbing stairs? Y Y  Comment - back pain  Dressing or bathing? N N  Doing errands, shopping? N N  Preparing Food and eating ? N -  Using the Toilet? N -  In the past six months, have you accidently leaked urine? N -  Do you have problems with loss of bowel control? N -  Managing your Medications? N -  Managing your Finances? N -  Housekeeping or managing your Housekeeping? N -  Some recent data might be hidden    Patient Care Team: [name], DO as PCP - General (Family Medicine)    Assessment:   This is a routine wellness examination for Savannah.  Exercise Activities and Dietary recommendations Current Exercise Habits: Home exercise routine, Type of exercise: strength training/weights, Time (Minutes): 15(PT exercises throughout the day), Frequency (Times/Week): 7, Weekly Exercise (Minutes/Week): 105, Intensity: Mild, Exercise limited by: None identified  Goals    . Quit Smoking     Smoking cessation discussed       Fall Risk Fall Risk  11/20/2017  Falls in the past year? No   Is the patient's home free of loose throw rugs in walkways, pet beds, electrical cords, etc?   no      Grab bars in the bathroom? no      Handrails on the stairs?   yes      Adequate lighting?   yes  Timed Get Up and Go performed: Completed in 8 seconds with no use of assistive devices, steady gait. No intervention needed at this time.   Depression Screen PHQ 2/9 Scores 11/20/2017 10/30/2017  PHQ - 2 Score 4 2  PHQ- 9 Score 10 8     Cognitive Function     6CIT Screen 11/20/2017  What Year? 0  points  What month? 0 points  What time? 0 points  Count back from 20 0 points  Months in reverse 0 points  Repeat phrase 0 points  Total Score 0    Immunization History  Administered Date(s) Administered  . Influenza-Unspecified 06/15/2017    Qualifies for Shingles Vaccine? Patient stated it was done in 2012, declines shingrix vaccine   Screening Tests Health Maintenance  Topic Date Due  . PNA vac Low Risk Adult (1 of 2 - PCV13) 12/20/2017 (Originally 06/18/2005)  . TETANUS/TDAP  11/21/2018 (Originally 06/19/1960)  . INFLUENZA VACCINE  03/15/2018  . DEXA SCAN  Completed    Cancer Screenings: Lung: Low Dose CT Chest recommended if Age 70-80 years, 30 pack-year currently smoking OR have quit w/in 15years. Patient does qualify. Message sent to Burgess Estelle, RN- Oncology Navigator Breast:  Up to date on Mammogram? Yes   Up to date of Bone Density/Dexa? Yes- patient states this was done in [place] in 2012 Colorectal: no longer required  Additional Screenings:  Hepatitis C Screening: not indicated      Plan:  I have personally reviewed and addressed the Medicare Annual Wellness questionnaire and have noted the following in the patient's chart:  A. Medical and social history B. Use of alcohol, tobacco or illicit drugs  C. Current medications and supplements D. Functional ability and status E.  Nutritional status F.  Physical activity G. Advance directives H. List of other physicians I.  Hospitalizations, surgeries, and ER visits in previous 12 months J.  [place] such as hearing and vision if needed, cognitive and depression L. Referrals and appointments   In addition, I have reviewed and discussed with patient certain preventive protocols, quality metrics, and best practice recommendations. A written personalized  care plan for preventive services as well as general preventive health recommendations were provided to patient.   Signed,  Tyler Aas,  LPN Nurse Health Advisor   Nurse Notes: Pneumonia vaccine postponed due o patient feeling ill today. She would like to receive it when she feels better.

## 2017-11-20 NOTE — Assessment & Plan Note (Signed)
Chronic. Has a referral in to pain management pending in May. Will obtain records from previous providers. Needs refill on lidocaine patches. Rx given today. Call with any concerns.

## 2017-11-21 ENCOUNTER — Ambulatory Visit
Admission: RE | Admit: 2017-11-21 | Discharge: 2017-11-21 | Disposition: A | Discharge status: Home / Self Care | Payer: Medicare Other | Source: Ambulatory Visit | Attending: Family Medicine | Admitting: Family Medicine

## 2017-11-21 ENCOUNTER — Telehealth: Payer: Self-pay | Admitting: *Deleted

## 2017-11-21 DIAGNOSIS — I700 Atherosclerosis of aorta: Secondary | ICD-10-CM | POA: Insufficient documentation

## 2017-11-21 DIAGNOSIS — R918 Other nonspecific abnormal finding of lung field: Secondary | ICD-10-CM | POA: Insufficient documentation

## 2017-11-21 DIAGNOSIS — J449 Chronic obstructive pulmonary disease, unspecified: Secondary | ICD-10-CM | POA: Diagnosis not present

## 2017-11-21 DIAGNOSIS — Z87891 Personal history of nicotine dependence: Secondary | ICD-10-CM

## 2017-11-21 DIAGNOSIS — R05 Cough: Secondary | ICD-10-CM | POA: Diagnosis not present

## 2017-11-21 DIAGNOSIS — Z122 Encounter for screening for malignant neoplasm of respiratory organs: Secondary | ICD-10-CM

## 2017-11-21 NOTE — Telephone Encounter (Signed)
Received referral for initial lung cancer screening scan. Contacted patient and obtained smoking history,(current, 55.5 pack year) as well as answering questions related to screening process. Patient denies signs of lung cancer such as weight loss or hemoptysis. Patient denies comorbidity that would prevent curative treatment if lung cancer were found. Patient is scheduled for shared decision making visit and CT scan on 12/06/17.

## 2017-11-22 ENCOUNTER — Telehealth: Payer: Self-pay | Admitting: Family Medicine

## 2017-11-22 MED ORDER — LEVOFLOXACIN 750 MG PO TABS: 750.0000 mg | ORAL_TABLET | Freq: Every day | ORAL | 0 refills | Status: DC

## 2017-11-22 NOTE — Telephone Encounter (Signed)
Patient notified

## 2017-11-22 NOTE — Telephone Encounter (Signed)
Please let her know that her x-ray did show some pneumonia, so I've sent a Rx for an antibiotic over to [place] and I'll see her on Monday like we planned. Thanks!

## 2017-11-28 ENCOUNTER — Encounter: Payer: Self-pay | Admitting: Family Medicine

## 2017-11-28 ENCOUNTER — Ambulatory Visit (INDEPENDENT_AMBULATORY_CARE_PROVIDER_SITE_OTHER): Payer: Medicare Other | Admitting: Family Medicine

## 2017-11-28 VITALS — BP 101/67 | HR 132 | Temp 98.4°F | Wt 98.0 lb

## 2017-11-28 DIAGNOSIS — Z79899 Other long term (current) drug therapy: Secondary | ICD-10-CM | POA: Diagnosis not present

## 2017-11-28 DIAGNOSIS — R042 Hemoptysis: Secondary | ICD-10-CM | POA: Diagnosis not present

## 2017-11-28 DIAGNOSIS — M48061 Spinal stenosis, lumbar region without neurogenic claudication: Secondary | ICD-10-CM

## 2017-11-28 DIAGNOSIS — J449 Chronic obstructive pulmonary disease, unspecified: Secondary | ICD-10-CM

## 2017-11-28 DIAGNOSIS — J189 Pneumonia, unspecified organism: Secondary | ICD-10-CM

## 2017-11-28 DIAGNOSIS — Z598 Other problems related to housing and economic circumstances: Secondary | ICD-10-CM | POA: Diagnosis not present

## 2017-11-28 DIAGNOSIS — J181 Lobar pneumonia, unspecified organism: Secondary | ICD-10-CM | POA: Diagnosis not present

## 2017-11-28 DIAGNOSIS — Z599 Problem related to housing and economic circumstances, unspecified: Secondary | ICD-10-CM

## 2017-11-28 MED ORDER — OXYCODONE HCL 5 MG PO TABS: 5.0000 mg | ORAL_TABLET | Freq: Four times a day (QID) | ORAL | 0 refills | Status: DC | PRN

## 2017-11-28 NOTE — Assessment & Plan Note (Signed)
Has appointment scheduled with pain management. Will be out of her oxycodone before then. Rx given today to last until that appointment. Will likely need another refill from here before she can get it from them if they agree to take over her medicine.

## 2017-11-28 NOTE — Assessment & Plan Note (Signed)
Continues with decreased breath sounds. no change. Will reach out to Lung cancer nurse navigator regarding CT scan- repeat in 2 weeks.

## 2017-11-28 NOTE — Progress Notes (Signed)
BP 101/67 (BP Location: Left Arm, Patient Position: Sitting, Cuff Size: Small)   Pulse (!) 132   Temp 98.4 F (36.9 C) (Oral)   Wt 98 lb (44.5 kg)   SpO2 98%   BMI 18.52 kg/m    Subjective:    Patient ID: Savannah Mcclure, female    DOB: 1941/05/03, 77 y.o.   MRN: [id number]  HPI: Savannah Mcclure is a 77 y.o. female  Chief Complaint  Patient presents with  . Pneumonia   Starting to feel a little better starting yesterday. She is still feeling really tired. She had been coughing up large clots of blood for the first 3-4 days that she was on the antibiotics using her nebulizer. No fevers. No chills.   She notes that she has been under a lot of stress. Daughter spends all her social security check. Having issues with being able to get food on the table. Not sure what to do.   Has known DJD with spinal stenosis. Has been on oxycodone for years. Scheduled to see pain clinic in May, but only has 9 of her pills left. Would like a refill today. Pain is aching and in her low back. Chronic. Better with medicine. Worse with movement or exercise. No other concerns or complaints at this time.   Relevant past medical, surgical, family and social history reviewed and updated as indicated. Interim medical history since our last visit reviewed. Allergies and medications reviewed and updated.  Review of Systems  Constitutional: Negative.   HENT: Negative.   Respiratory: Positive for cough and shortness of breath. Negative for apnea, choking, chest tightness, wheezing and stridor.   Cardiovascular: Negative.   Gastrointestinal: Negative.   Neurological: Negative.   Psychiatric/Behavioral: Negative.     Per HPI unless specifically indicated above     Objective:    BP 101/67 (BP Location: Left Arm, Patient Position: Sitting, Cuff Size: Small)   Pulse (!) 132   Temp 98.4 F (36.9 C) (Oral)   Wt 98 lb (44.5 kg)   SpO2 98%   BMI 18.52 kg/m   Wt Readings from Last 3 Encounters:    11/28/17 98 lb (44.5 kg)  11/20/17 98 lb 7 oz (44.7 kg)  11/20/17 98 lb 11.2 oz (44.8 kg)    Physical Exam  Constitutional: She is oriented to person, place, and time. She appears well-developed and well-nourished. No distress.  HENT:  Head: Normocephalic and atraumatic.  Right Ear: Hearing normal.  Left Ear: Hearing normal.  Nose: Nose normal.  Eyes: Conjunctivae and lids are normal. Right eye exhibits no discharge. Left eye exhibits no discharge. No scleral icterus.  Cardiovascular: Normal rate, regular rhythm, normal heart sounds and intact distal pulses. Exam reveals no gallop and no friction rub.  No murmur heard. Pulmonary/Chest: Effort normal. No respiratory distress.  Decreased breath sounds throughout  Musculoskeletal: Normal range of motion.  Neurological: She is alert and oriented to person, place, and time.  Skin: Skin is intact. No rash noted. She is not diaphoretic.  Psychiatric: She has a normal mood and affect. Her speech is normal and behavior is normal. Judgment and thought content normal. Cognition and memory are normal.    Results for orders placed or performed during the hospital encounter of 10/31/17  Urine culture  Result Value Ref Range   Specimen Description      URINE, RANDOM Performed at [facility], [address] [place], [place] [address]    Special Requests  NONE Performed at [facility], [address], [place], [place] [address]    Culture      NO GROWTH Performed at [facility], [place] [address] [place], [place] [address]    Report Status 11/02/2017 FINAL   CBC with Differential/Platelet  Result Value Ref Range   WBC 14.1 (H) 3.6 - 11.0 K/uL   RBC 3.56 (L) 3.80 - 5.20 MIL/uL   Hemoglobin 11.1 (L) 12.0 - 16.0 g/dL   HCT 33.8 (L) 35.0 - 47.0 %   MCV 94.8 80.0 - 100.0 fL   MCH 31.1 26.0 - 34.0 pg   MCHC 32.8 32.0 - 36.0 g/dL   RDW 13.8 11.5 - 14.5 %   Platelets 412 150 - 440 K/uL   Neutrophils Relative  % 80 %   Neutro Abs 11.3 (H) 1.4 - 6.5 K/uL   Lymphocytes Relative 13 %   Lymphs Abs 1.8 1.0 - 3.6 K/uL   Monocytes Relative 6 %   Monocytes Absolute 0.8 0.2 - 0.9 K/uL   Eosinophils Relative 1 %   Eosinophils Absolute 0.1 0 - 0.7 K/uL   Basophils Relative 0 %   Basophils Absolute 0.0 0 - 0.1 K/uL  Basic metabolic panel  Result Value Ref Range   Sodium 137 135 - 145 mmol/L   Potassium 3.4 (L) 3.5 - 5.1 mmol/L   Chloride 101 101 - 111 mmol/L   CO2 26 22 - 32 mmol/L   Glucose, Bld 126 (H) 65 - 99 mg/dL   BUN 17 6 - 20 mg/dL   Creatinine, Ser 0.87 0.44 - 1.00 mg/dL   Calcium 8.9 8.9 - 10.3 mg/dL   GFR calc non Af Amer >60 >60 mL/min   GFR calc Af Amer >60 >60 mL/min   Anion gap 10 5 - 15  Troponin I  Result Value Ref Range   Troponin I <0.03 <0.03 ng/mL  Urinalysis, Complete w Microscopic  Result Value Ref Range   Color, Urine AMBER (A) YELLOW   APPearance CLOUDY (A) CLEAR   Specific Gravity, Urine 1.020 1.005 - 1.030   pH 5.0 5.0 - 8.0   Glucose, UA NEGATIVE NEGATIVE mg/dL   Hgb urine dipstick NEGATIVE NEGATIVE   Bilirubin Urine NEGATIVE NEGATIVE   Ketones, ur NEGATIVE NEGATIVE mg/dL   Protein, ur NEGATIVE NEGATIVE mg/dL   Nitrite NEGATIVE NEGATIVE   Leukocytes, UA TRACE (A) NEGATIVE   RBC / HPF 0-5 0 - 5 RBC/hpf   WBC, UA 6-30 0 - 5 WBC/hpf   Bacteria, UA RARE (A) NONE SEEN   Squamous Epithelial / LPF 0-5 (A) NONE SEEN   Mucus PRESENT    Budding Yeast PRESENT    Hyaline Casts, UA PRESENT   Influenza panel by PCR (type A & B)  Result Value Ref Range   Influenza A By PCR NEGATIVE NEGATIVE   Influenza B By PCR NEGATIVE NEGATIVE      Assessment & Plan:   Problem List Items Addressed This Visit      Respiratory   COPD (chronic obstructive pulmonary disease) ([place])    Continues with decreased breath sounds. no change. Will reach out to Lung cancer nurse navigator regarding CT scan- repeat in 2 weeks.         Other   Spinal stenosis of lumbar region without  neurogenic claudication    Has appointment scheduled with pain management. Will be out of her oxycodone before then. Rx given today to last until that appointment. Will likely need  another refill from here before she can get it from them if they agree to take over her medicine.       Controlled substance agreement signed    Signed today. See scanned document.        Other Visit Diagnoses    Hemoptysis    -  Primary   Continues with decreased breath sounds. no change. Will reach out to Lung cancer nurse navigator regarding CT scan- repeat in 2 weeks.    Pneumonia of right middle lobe due to infectious organism ([place])       Continues with decreased breath sounds. no change. Will reach out to Lung cancer nurse navigator regarding CT scan- repeat in 2 weeks.    Financial difficulties       Referral to [facility] made today.   Relevant Orders   Ambulatory referral to [facility]       Follow up plan: Return in about 2 weeks (around 12/12/2017).

## 2017-11-28 NOTE — Assessment & Plan Note (Signed)
Signed today. See scanned document.

## 2017-12-01 ENCOUNTER — Telehealth: Payer: Self-pay | Admitting: Family Medicine

## 2017-12-01 NOTE — Telephone Encounter (Signed)
Called to check in to see on how her breathing is doing. Doing much better. Cough is better. Feeling more like herself. Needs to hold off on the low dose CT scan due to illness now. Will try to get it scheduled for 2nd week in April for follow up on the pneumonia. Patient is aware.

## 2017-12-01 NOTE — Telephone Encounter (Signed)
-----   Message from [name], DO sent at 11/28/2017 11:14 AM EDT ----- Call about breathing and how she is feeling

## 2017-12-06 ENCOUNTER — Ambulatory Visit: Payer: Medicare Other

## 2017-12-06 ENCOUNTER — Ambulatory Visit: Payer: Medicare Other | Admitting: Oncology

## 2017-12-11 DIAGNOSIS — H211X1 Other vascular disorders of iris and ciliary body, right eye: Secondary | ICD-10-CM | POA: Diagnosis not present

## 2017-12-11 DIAGNOSIS — H269 Unspecified cataract: Secondary | ICD-10-CM | POA: Diagnosis not present

## 2017-12-12 ENCOUNTER — Ambulatory Visit (INDEPENDENT_AMBULATORY_CARE_PROVIDER_SITE_OTHER): Payer: Medicare Other | Admitting: Family Medicine

## 2017-12-12 ENCOUNTER — Encounter: Payer: Self-pay | Admitting: Family Medicine

## 2017-12-12 VITALS — BP 117/79 | HR 112 | Temp 98.5°F | Wt 99.0 lb

## 2017-12-12 DIAGNOSIS — Z23 Encounter for immunization: Secondary | ICD-10-CM | POA: Diagnosis not present

## 2017-12-12 DIAGNOSIS — S51802A Unspecified open wound of left forearm, initial encounter: Secondary | ICD-10-CM | POA: Diagnosis not present

## 2017-12-12 DIAGNOSIS — J189 Pneumonia, unspecified organism: Secondary | ICD-10-CM

## 2017-12-12 DIAGNOSIS — K120 Recurrent oral aphthae: Secondary | ICD-10-CM | POA: Diagnosis not present

## 2017-12-12 DIAGNOSIS — J181 Lobar pneumonia, unspecified organism: Secondary | ICD-10-CM

## 2017-12-12 DIAGNOSIS — I10 Essential (primary) hypertension: Secondary | ICD-10-CM | POA: Diagnosis not present

## 2017-12-12 MED ORDER — LIDOCAINE VISCOUS 2 % MT SOLN: 20.0000 mL | OROMUCOSAL | 0 refills | Status: DC | PRN

## 2017-12-12 MED ORDER — MUPIROCIN 2 % EX OINT: 1.0000 "application " | TOPICAL_OINTMENT | Freq: Two times a day (BID) | CUTANEOUS | 0 refills | Status: DC

## 2017-12-12 NOTE — Patient Instructions (Addendum)
Td Vaccine (Tetanus and Diphtheria): What You Need to Know 1. Why get vaccinated? Tetanus  and diphtheria are very serious diseases. They are rare in the [place] today, but people who do become infected often have severe complications. Td vaccine is used to protect adolescents and adults from both of these diseases. Both tetanus and diphtheria are infections caused by bacteria. Diphtheria spreads from person to person through coughing or sneezing. Tetanus-causing bacteria enter the body through cuts, scratches, or wounds. TETANUS (lockjaw) causes painful muscle tightening and stiffness, usually all over the body.  It can lead to tightening of muscles in the head and neck so you can't open your mouth, swallow, or sometimes even breathe. Tetanus kills about 1 out of every 10 people who are infected even after receiving the best medical care.  DIPHTHERIA can cause a thick coating to form in the back of the throat.  It can lead to breathing problems, paralysis, heart failure, and death.  Before vaccines, as many as 200,000 cases of diphtheria and hundreds of cases of tetanus were reported in the [place] each year. Since vaccination began, reports of cases for both diseases have dropped by about 99%. 2. Td vaccine Td vaccine can protect adolescents and adults from tetanus and diphtheria. Td is usually given as a booster dose every 10 years but it can also be given earlier after a severe and dirty wound or burn. Another vaccine, called Tdap, which protects against pertussis in addition to tetanus and diphtheria, is sometimes recommended instead of Td vaccine. Your doctor or the person giving you the vaccine can give you more information. Td may safely be given at the same time as other vaccines. 3. Some people should not get this vaccine  A person who has ever had a life-threatening allergic reaction after a previous dose of any tetanus or diphtheria containing vaccine, OR has a severe  allergy to any part of this vaccine, should not get Td vaccine. Tell the person giving the vaccine about any severe allergies.  Talk to your doctor if you: ? had severe pain or swelling after any vaccine containing diphtheria or tetanus, ? ever had a condition called Guillain Barre Syndrome (GBS), ? aren't feeling well on the day the shot is scheduled. 4. What are the risks from Td vaccine? With any medicine, including vaccines, there is a chance of side effects. These are usually mild and go away on their own. Serious reactions are also possible but are rare. Most people who get Td vaccine do not have any problems with it. Mild problems following Td vaccine: (Did not interfere with activities)  Pain where the shot was given (about 8 people in 10)  Redness or swelling where the shot was given (about 1 person in 4)  Mild fever (rare)  Headache (about 1 person in 4)  Tiredness (about 1 person in 4)  Moderate problems following Td vaccine: (Interfered with activities, but did not require medical attention)  Fever over 102F (rare)  Severe problems following Td vaccine: (Unable to perform usual activities; required medical attention)  Swelling, severe pain, bleeding and/or redness in the arm where the shot was given (rare).  Problems that could happen after any vaccine:  People sometimes faint after a medical procedure, including vaccination. Sitting or lying down for about 15 minutes can help prevent fainting, and injuries caused by a fall. Tell your doctor if you feel dizzy, or have vision changes or ringing in the ears.  Some people get   severe pain in the shoulder and have difficulty moving the arm where a shot was given. This happens very rarely.  Any medication can cause a severe allergic reaction. Such reactions from a vaccine are very rare, estimated at fewer than 1 in a million doses, and would happen within a few minutes to a few hours after the vaccination. As with any  medicine, there is a very remote chance of a vaccine causing a serious injury or death. The safety of vaccines is always being monitored. For more information, visit: http://www.aguilar.org/ 5. What if there is a serious reaction? What should I look for? Look for anything that concerns you, such as signs of a severe allergic reaction, very high fever, or unusual behavior. Signs of a severe allergic reaction can include hives, swelling of the face and throat, difficulty breathing, a fast heartbeat, dizziness, and weakness. These would usually start a few minutes to a few hours after the vaccination. What should I do?  If you think it is a severe allergic reaction or other emergency that can't wait, call 9-1-1 or get the person to the nearest hospital. Otherwise, call your doctor.  Afterward, the reaction should be reported to the Vaccine Adverse Event Reporting System (VAERS). Your doctor might file this report, or you can do it yourself through the VAERS web site at www.vaers.SamedayNews.es, or by calling (765)731-3315. ? VAERS does not give medical advice. 6. The National Vaccine Injury Compensation Program The [employer] Vaccine Injury Compensation Program (VICP) is a federal program that was created to compensate people who may have been injured by certain vaccines. Persons who believe they may have been injured by a vaccine can learn about the program and about filing a claim by calling 757-103-1762 or visiting the [employer] website at GoldCloset.com.ee. There is a time limit to file a claim for compensation. 7. How can I learn more?  Ask your doctor. He or she can give you the vaccine package insert or suggest other sources of information.  Call your local or state health department.  Contact the Centers for Disease Control and Prevention (CDC): ? Call 818-013-4181 (1-800-CDC-INFO) ? Visit CDC's website at http://hunter.com/ CDC Td Vaccine VIS (11/24/15) This information is  not intended to replace advice given to you by your health care provider. Make sure you discuss any questions you have with your health care provider. Document Released: 05/29/2006 Document Revised: 04/21/2016 Document Reviewed: 04/21/2016 Elsevier Interactive Patient Education  2017 [place] Canker sores are small, painful sores that develop inside your mouth. They may also be called aphthous ulcers. You can get canker sores on the inside of your lips or cheeks, on your tongue, or anywhere inside your mouth. You can have just one canker sore or several of them. Canker sores cannot be passed from one person to another (noncontagious). These sores are different than the sores that you may get on the outside of your lips (cold sores or fever blisters). Canker sores usually start as painful red bumps. Then they turn into small white, yellow, or gray ulcers that have red borders. The ulcers may be quite painful. The pain may be worse when you eat or drink. What are the causes? The cause of this condition is not known. What increases the risk? This condition is more likely to develop in:  Women.  People in their teens or 77s.  Women who are having their menstrual period.  People who are under a lot of emotional stress.  People who  do not get enough iron or B vitamins.  People who have poor oral hygiene.  People who have an injury inside the mouth. This can happen after having dental work or from chewing something hard.  What are the signs or symptoms? Along with the canker sore, symptoms may also include:  Fever.  Fatigue.  Swollen lymph nodes in your neck.  How is this diagnosed? This condition can be diagnosed based on your symptoms. Your health care provider will also examine your mouth. Your health care provider may also do tests if you get canker sores often or if they are very bad. Tests may include:  Blood tests to rule out other causes of canker  sores.  Taking swabs from the sore to check for infection.  Taking a small piece of skin from the sore (biopsy) to test it for cancer.  How is this treated? Most canker sores clear up without treatment in about 10 days. Home care is usually the only treatment that you will need. Over-the-counter medicines can relieve discomfort.If you have severe canker sores, your health care provider may prescribe:  Numbing ointment to relieve pain.  Vitamins.  Steroid medicines. These may be given as: ? Oral pills. ? Mouth rinses. ? Gels.  Antibiotic mouth rinse.  Follow these instructions at home:  Apply, take, or use medicines only as directed by your health care provider. These include vitamins.  If you were prescribed an antibiotic mouth rinse, finish all of it even if you start to feel better.  Until the sores are healed: ? Do not drink coffee or citrus juices. ? Do not eat spicy or salty foods.  Use a mild, over-the-counter mouth rinse as directed by your health care provider.  Practice good oral hygiene. ? Floss your teeth every day. ? Brush your teeth with a soft brush twice each day. Contact a health care provider if:  Your symptoms do not get better after two weeks.  You also have a fever or swollen glands.  You get canker sores often.  You have a canker sore that is getting larger.  You cannot eat or drink due to your canker sores. This information is not intended to replace advice given to you by your health care provider. Make sure you discuss any questions you have with your health care provider. Document Released: 11/26/2010 Document Revised: 01/07/2016 Document Reviewed: 07/02/2014 Elsevier Interactive Patient Education  2018 [employer].  Hypotension Hypotension, commonly called low blood pressure, is when the force of blood pumping through your arteries is too weak. Arteries are blood vessels that carry blood from the heart throughout the body. When blood  pressure is too low, you may not get enough blood to your brain or to the rest of your organs. This can cause weakness, light-headedness, rapid heartbeat, and fainting. Depending on the cause and severity, hypotension may be harmless (benign) or cause serious problems (critical). What are the causes? Possible causes of hypotension include:  Blood loss.  Loss of body fluids (dehydration).  Heart problems.  Hormone (endocrine) problems.  Pregnancy.  Severe infection.  Lack of certain nutrients.  Severe allergic reactions (anaphylaxis).  Certain medicines, such as blood pressure medicine or medicines that make the body lose excess fluids (diuretics). Sometimes, hypotension can be caused by not taking medicine as directed, such as taking too much of a certain medicine.  What increases the risk? Certain factors can make you more likely to develop hypotension, including:  Age. Risk increases as you get  older.  Conditions that affect the heart or the central nervous system.  Taking certain medicines, such as blood pressure medicine or diuretics.  Being pregnant.  What are the signs or symptoms? Symptoms of this condition may include:  Weakness.  Light-headedness.  Dizziness.  Blurred vision.  Fatigue.  Rapid heartbeat.  Fainting, in severe cases.  How is this diagnosed? This condition is diagnosed based on:  Your medical history.  Your symptoms.  Your blood pressure measurement. Your health care provider will check your blood pressure when you are: ? Lying down. ? Sitting. ? Standing.  A blood pressure reading is recorded as two numbers, such as "120 over 80" (or 120/80). The first ("top") number is called the systolic pressure. It is a measure of the pressure in your arteries as your heart beats. The second ("bottom") number is called the diastolic pressure. It is a measure of the pressure in your arteries when your heart relaxes between beats. Blood pressure  is measured in a unit called mm Hg. Healthy blood pressure for adults is 120/80. If your blood pressure is below 90/60, you may be diagnosed with hypotension. Other information or tests that may be used to diagnose hypotension include:  Your other vital signs, such as your heart rate and temperature.  Blood tests.  Tilt table test. For this test, you will be safely secured to a table that moves you from a lying position to an upright position. Your heart rhythm and blood pressure will be monitored during the test.  How is this treated? Treatment for this condition may include:  Changing your diet. This may involve eating more salt (sodium) or drinking more water.  Taking medicines to raise your blood pressure.  Changing the dosage of certain medicines you are taking that might be lowering your blood pressure.  Wearing compression stockings. These stockings help to prevent blood clots and reduce swelling in your legs.  In some cases, you may need to go to the hospital for:  Fluid replacement. This means you will receive fluids through an IV tube.  Blood replacement. This means you will receive donated blood through an IV tube (transfusion).  Treating an infection or heart problems, if this applies.  Monitoring. You may need to be monitored while medicines that you are taking wear off.  Follow these instructions at home: Eating and drinking   Drink enough fluid to keep your urine clear or pale yellow.  Eat a healthy diet and follow instructions from your health care provider about eating or drinking restrictions. A healthy diet includes: ? Fresh fruits and vegetables. ? Whole grains. ? Lean meats. ? Low-fat dairy products.  Eat extra salt only as directed. Do not add extra salt to your diet unless your health care provider told you to do that.  Eat frequent, small meals.  Avoid standing up suddenly after eating. Medicines  Take over-the-counter and prescription  medicines only as told by your health care provider. ? Follow instructions from your health care provider about changing the dosage of your current medicines, if this applies. ? Do not stop or adjust any of your medicines on your own. General instructions  Wear compression stockings as told by your health care provider.  Get up slowly from lying down or sitting positions. This gives your blood pressure a chance to adjust.  Avoid hot showers and excessive heat as directed by your health care provider.  Return to your normal activities as told by your health care provider. Ask  your health care provider what activities are safe for you.  Do not use any products that contain nicotine or tobacco, such as cigarettes and e-cigarettes. If you need help quitting, ask your health care provider.  Keep all follow-up visits as told by your health care provider. This is important. Contact a health care provider if:  You vomit.  You have diarrhea.  You have a fever for more than 2-3 days.  You feel more thirsty than usual.  You feel weak and tired. Get help right away if:  You have chest pain.  You have a fast or irregular heartbeat.  You develop numbness in any part of your body.  You cannot move your arms or your legs.  You have trouble speaking.  You become sweaty or feel light-headed.  You faint.  You feel short of breath.  You have trouble staying awake.  You feel confused. This information is not intended to replace advice given to you by your health care provider. Make sure you discuss any questions you have with your health care provider. Document Released: 08/01/2005 Document Revised: 02/19/2016 Document Reviewed: 01/21/2016 Elsevier Interactive Patient Education  2018 [employer].

## 2017-12-12 NOTE — Progress Notes (Signed)
BP 117/79 (BP Location: Left Arm, Cuff Size: Small)   Pulse (!) 112   Temp 98.5 F (36.9 C)   Wt 99 lb (44.9 kg)   SpO2 97%   BMI 18.71 kg/m    Subjective:    Patient ID: Savannah Mcclure, female    DOB: [birth year], 77 y.o.   MRN: [id number]  HPI: Savannah Mcclure is a 77 y.o. female  Chief Complaint  Patient presents with  . Pneumonia  . skin lesion    corner of mouth  . Open Wound    left arm   Feeling better since her antibiotic. Lungs feeling much better. Coughing occiasionally. Mucus is still green and sometimes clear. Much better. Starting to get a little bit of energy back and feeling more like herself. Hasn't noticed any blood.   Has a sore spot on the L side of her mouth. She has been putting coloidal silver on it.   Had a dog jump up on her a couple of days ago and pulled on her skin. She has a skin tear on her L arm and has a wrap on it. Healing well.  She notes that she has seen eye doctor and can't have any surgery- she is considering legal action regarding her eye doctor in [place].   Daughter has been in the hospital for behavioral issues. Now linked in with [facility], but she has been caring for her grandkids. Has been under a lot of stress.    HYPERTENSION- BP running low today, did not sleep at all last night Hypertension status: exacerbated  Satisfied with current treatment? no Duration of hypertension: chronic BP monitoring frequency:  not checking BP medication side effects:  yes- hypotension now Medication compliance: excellent compliance Previous BP meds: benazepril Aspirin: no Recurrent headaches: no Visual changes: no Palpitations: no Dyspnea: no Chest pain: no Lower extremity edema: no Dizzy/lightheaded: no   Relevant past medical, surgical, family and social history reviewed and updated as indicated. Interim medical history since our last visit reviewed. Allergies and medications reviewed and updated.  Review of Systems  Constitutional:  Negative.   Respiratory: Positive for cough and shortness of breath. Negative for apnea, choking, chest tightness, wheezing and stridor.   Cardiovascular: Negative.   Psychiatric/Behavioral: Negative.     Per HPI unless specifically indicated above     Objective:    BP 117/79 (BP Location: Left Arm, Cuff Size: Small)   Pulse (!) 112   Temp 98.5 F (36.9 C)   Wt 99 lb (44.9 kg)   SpO2 97%   BMI 18.71 kg/m   Wt Readings from Last 3 Encounters:  12/12/17 99 lb (44.9 kg)  11/28/17 98 lb (44.5 kg)  11/20/17 98 lb 7 oz (44.7 kg)    Physical Exam  Constitutional: She is oriented to person, place, and time. She appears well-developed and well-nourished. No distress.  HENT:  Head: Normocephalic and atraumatic.  Right Ear: Hearing and external ear normal.  Left Ear: Hearing and external ear normal.  Nose: Nose normal.  Mouth/Throat: Oropharynx is clear and moist. No oropharyngeal exudate.  Eyes: Pupils are equal, round, and reactive to light. Conjunctivae, EOM and lids are normal. Right eye exhibits no discharge. Left eye exhibits no discharge. No scleral icterus.  Neck: Normal range of motion. Neck supple. No JVD present. No tracheal deviation present. No thyromegaly present.  Cardiovascular: Normal rate, regular rhythm, normal heart sounds and intact distal pulses. Exam reveals no gallop and no friction rub.  No murmur heard. Pulmonary/Chest: Effort normal and breath sounds normal. No stridor. No respiratory distress. She has no wheezes. She has no rales. She exhibits no tenderness.  Musculoskeletal: Normal range of motion.  Lymphadenopathy:    She has no cervical adenopathy.  Neurological: She is alert and oriented to person, place, and time.  Skin: Skin is warm, dry and intact. Capillary refill takes less than 2 seconds. No rash noted. She is not diaphoretic. No erythema. No pallor.  Open wound L arm, healing well  Psychiatric: She has a normal mood and affect. Her speech is  normal and behavior is normal. Judgment and thought content normal. Cognition and memory are normal.  Nursing note and vitals reviewed.   Results for orders placed or performed during the hospital encounter of 10/31/17  Urine culture  Result Value Ref Range   Specimen Description      URINE, RANDOM Performed at [facility], [address], [place], [place] [address]    Special Requests      NONE Performed at [facility], [address], [place], [place] [address]    Culture      NO GROWTH Performed at [facility], [place] [address], [place], [place] [address]    Report Status 11/02/2017 FINAL   CBC with Differential/Platelet  Result Value Ref Range   WBC 14.1 (H) 3.6 - 11.0 K/uL   RBC 3.56 (L) 3.80 - 5.20 MIL/uL   Hemoglobin 11.1 (L) 12.0 - 16.0 g/dL   HCT 33.8 (L) 35.0 - 47.0 %   MCV 94.8 80.0 - 100.0 fL   MCH 31.1 26.0 - 34.0 pg   MCHC 32.8 32.0 - 36.0 g/dL   RDW 13.8 11.5 - 14.5 %   Platelets 412 150 - 440 K/uL   Neutrophils Relative % 80 %   Neutro Abs 11.3 (H) 1.4 - 6.5 K/uL   Lymphocytes Relative 13 %   Lymphs Abs 1.8 1.0 - 3.6 K/uL   Monocytes Relative 6 %   Monocytes Absolute 0.8 0.2 - 0.9 K/uL   Eosinophils Relative 1 %   Eosinophils Absolute 0.1 0 - 0.7 K/uL   Basophils Relative 0 %   Basophils Absolute 0.0 0 - 0.1 K/uL  Basic metabolic panel  Result Value Ref Range   Sodium 137 135 - 145 mmol/L   Potassium 3.4 (L) 3.5 - 5.1 mmol/L   Chloride 101 101 - 111 mmol/L   CO2 26 22 - 32 mmol/L   Glucose, Bld 126 (H) 65 - 99 mg/dL   BUN 17 6 - 20 mg/dL   Creatinine, Ser 0.87 0.44 - 1.00 mg/dL   Calcium 8.9 8.9 - 10.3 mg/dL   GFR calc non Af Amer >60 >60 mL/min   GFR calc Af Amer >60 >60 mL/min   Anion gap 10 5 - 15  Troponin I  Result Value Ref Range   Troponin I <0.03 <0.03 ng/mL  Urinalysis, Complete w Microscopic  Result Value Ref Range   Color, Urine AMBER (A) YELLOW   APPearance CLOUDY (A) CLEAR   Specific Gravity, Urine  1.020 1.005 - 1.030   pH 5.0 5.0 - 8.0   Glucose, UA NEGATIVE NEGATIVE mg/dL   Hgb urine dipstick NEGATIVE NEGATIVE   Bilirubin Urine NEGATIVE NEGATIVE   Ketones, ur NEGATIVE NEGATIVE mg/dL   Protein, ur NEGATIVE NEGATIVE mg/dL   Nitrite NEGATIVE NEGATIVE   Leukocytes, UA TRACE (A) NEGATIVE   RBC / HPF 0-5 0 - 5 RBC/hpf  WBC, UA 6-30 0 - 5 WBC/hpf   Bacteria, UA RARE (A) NONE SEEN   Squamous Epithelial / LPF 0-5 (A) NONE SEEN   Mucus PRESENT    Budding Yeast PRESENT    Hyaline Casts, UA PRESENT   Influenza panel by PCR (type A & B)  Result Value Ref Range   Influenza A By PCR NEGATIVE NEGATIVE   Influenza B By PCR NEGATIVE NEGATIVE      Assessment & Plan:   Problem List Items Addressed This Visit      Cardiovascular and Mediastinum   HTN (hypertension)    BP better on recheck. Encouraged keeping her fluids up. Call with any concerns.        Other Visit Diagnoses    Pneumonia of right middle lobe due to infectious organism [facility])    -  Primary   Improving. Will obtain repeat CT week of May 13-17. Take it easy, call wiht any concerns.    Relevant Medications   mupirocin ointment (BACTROBAN) 2 %   Open wound of lower arm, left, initial encounter       Due for Td. Given today.   Relevant Orders   Td vaccine greater than or equal to 7yo preservative free IM (Completed)   Canker sore       Will treat with bactroban and viscous lidocaine. Call with any concerns.        Follow up plan: Return Week of May 20.

## 2017-12-12 NOTE — Assessment & Plan Note (Signed)
BP better on recheck. Encouraged keeping her fluids up. Call with any concerns.

## 2017-12-15 DIAGNOSIS — D225 Melanocytic nevi of trunk: Secondary | ICD-10-CM | POA: Diagnosis not present

## 2017-12-15 DIAGNOSIS — D692 Other nonthrombocytopenic purpura: Secondary | ICD-10-CM | POA: Diagnosis not present

## 2017-12-15 DIAGNOSIS — L538 Other specified erythematous conditions: Secondary | ICD-10-CM | POA: Diagnosis not present

## 2017-12-15 DIAGNOSIS — D2262 Melanocytic nevi of left upper limb, including shoulder: Secondary | ICD-10-CM | POA: Diagnosis not present

## 2017-12-15 DIAGNOSIS — D2272 Melanocytic nevi of left lower limb, including hip: Secondary | ICD-10-CM | POA: Diagnosis not present

## 2017-12-15 DIAGNOSIS — D2271 Melanocytic nevi of right lower limb, including hip: Secondary | ICD-10-CM | POA: Diagnosis not present

## 2017-12-15 DIAGNOSIS — D2261 Melanocytic nevi of right upper limb, including shoulder: Secondary | ICD-10-CM | POA: Diagnosis not present

## 2017-12-15 DIAGNOSIS — L820 Inflamed seborrheic keratosis: Secondary | ICD-10-CM | POA: Diagnosis not present

## 2017-12-19 ENCOUNTER — Ambulatory Visit
Admission: RE | Admit: 2017-12-19 | Discharge: 2017-12-19 | Disposition: A | Discharge status: Home / Self Care | Payer: Medicare Other | Source: Ambulatory Visit | Attending: Student in an Organized Health Care Education/Training Program | Admitting: Student in an Organized Health Care Education/Training Program

## 2017-12-19 ENCOUNTER — Ambulatory Visit
Payer: Medicare Other | Attending: Student in an Organized Health Care Education/Training Program | Admitting: Student in an Organized Health Care Education/Training Program

## 2017-12-19 ENCOUNTER — Other Ambulatory Visit: Payer: Self-pay

## 2017-12-19 ENCOUNTER — Encounter: Payer: Self-pay | Admitting: Student in an Organized Health Care Education/Training Program

## 2017-12-19 ENCOUNTER — Telehealth: Payer: Self-pay | Admitting: Family Medicine

## 2017-12-19 VITALS — BP 109/91 | HR 106 | Temp 98.0°F | Resp 18 | Ht 61.0 in | Wt 99.0 lb

## 2017-12-19 DIAGNOSIS — M48062 Spinal stenosis, lumbar region with neurogenic claudication: Secondary | ICD-10-CM | POA: Insufficient documentation

## 2017-12-19 DIAGNOSIS — G894 Chronic pain syndrome: Secondary | ICD-10-CM | POA: Insufficient documentation

## 2017-12-19 DIAGNOSIS — M545 Low back pain: Secondary | ICD-10-CM | POA: Insufficient documentation

## 2017-12-19 DIAGNOSIS — G8929 Other chronic pain: Secondary | ICD-10-CM

## 2017-12-19 DIAGNOSIS — M533 Sacrococcygeal disorders, not elsewhere classified: Secondary | ICD-10-CM | POA: Diagnosis not present

## 2017-12-19 DIAGNOSIS — M5136 Other intervertebral disc degeneration, lumbar region: Secondary | ICD-10-CM | POA: Insufficient documentation

## 2017-12-19 DIAGNOSIS — M25512 Pain in left shoulder: Secondary | ICD-10-CM

## 2017-12-19 DIAGNOSIS — M546 Pain in thoracic spine: Secondary | ICD-10-CM | POA: Diagnosis not present

## 2017-12-19 DIAGNOSIS — M25552 Pain in left hip: Secondary | ICD-10-CM

## 2017-12-19 DIAGNOSIS — M4155 Other secondary scoliosis, thoracolumbar region: Secondary | ICD-10-CM | POA: Insufficient documentation

## 2017-12-19 DIAGNOSIS — M4120 Other idiopathic scoliosis, site unspecified: Secondary | ICD-10-CM | POA: Diagnosis not present

## 2017-12-19 DIAGNOSIS — M25551 Pain in right hip: Secondary | ICD-10-CM | POA: Diagnosis not present

## 2017-12-19 DIAGNOSIS — F1190 Opioid use, unspecified, uncomplicated: Secondary | ICD-10-CM

## 2017-12-19 DIAGNOSIS — Z79891 Long term (current) use of opiate analgesic: Secondary | ICD-10-CM | POA: Insufficient documentation

## 2017-12-19 DIAGNOSIS — J189 Pneumonia, unspecified organism: Secondary | ICD-10-CM

## 2017-12-19 DIAGNOSIS — Z981 Arthrodesis status: Secondary | ICD-10-CM | POA: Insufficient documentation

## 2017-12-19 DIAGNOSIS — J181 Lobar pneumonia, unspecified organism: Principal | ICD-10-CM

## 2017-12-19 DIAGNOSIS — M47816 Spondylosis without myelopathy or radiculopathy, lumbar region: Secondary | ICD-10-CM | POA: Diagnosis not present

## 2017-12-19 MED ORDER — GABAPENTIN 300 MG PO CAPS
ORAL_CAPSULE | ORAL | 0 refills | Status: DC
Start: 2017-12-19 — End: 2018-01-23

## 2017-12-19 MED ORDER — GABAPENTIN 300 MG PO CAPS: 300.0000 mg | ORAL_CAPSULE | Freq: Three times a day (TID) | ORAL | 0 refills | Status: DC

## 2017-12-19 NOTE — Patient Instructions (Addendum)
1.  Urine drug screen today. 2.  Please discuss stopping Klonopin with your primary care physician.  He will need to find another medication to help manage her anxiety if you would like to be considered for chronic opioid therapy here. 3.  X-rays of your lumbar, thoracic, SI joint, left shoulder. 4.  Increase gabapentin so that you are taking 300 mg daily for 2 weeks and increase to 300 mg twice daily. 5.  We have not taking over your chronic opioid management yet.  Once we have your x-rays, urine drug screen, and once you have weaned off and stopped Klonopin, we can consider taking over your chronic opioid regimen of oxycodone 5 mg 3 times daily as needed.  2 Rx for gabapentin have been escribed to your pharmacy.  Pt v/o need to have XRays, as ordered, completed prior to next appt. with pain clinic.

## 2017-12-19 NOTE — Telephone Encounter (Signed)
Order placed for her follow up CT

## 2017-12-19 NOTE — Progress Notes (Signed)
Safety precautions to be maintained throughout the outpatient stay will include: orient to surroundings, keep bed in low position, maintain call bell within reach at all times, provide assistance with transfer out of bed and ambulation.  

## 2017-12-19 NOTE — Progress Notes (Signed)
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 medications and interventional procedures to decrease the pain, allowing the patient to participate in the physical activity that will ultimately produce long-lasting pain reductions. The goal of the multidisciplinary approach is to return the patient to a higher level of overall function and to restore their ability to perform activities of daily living.  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  Patient also endorses bilateral hip and groin pain as well as left shoulder pain.  Of note patient's surgery in 2013 resulted in extended postoperative stay requiring a tracheostomy and feeding tube and prolonged rehab.  Patient states that she was in the hospital for approximately 2.5 months after her lumbar spine surgery.  Patient moved from this region to [place].  She was being seen  by pain management provider there.  Patient has been receiving oxycodone 5 mg 3 times daily to 4 times daily as needed since 2005/2006. Patient is currently on Klonopin 0.5 mg twice daily to 3 times daily as needed.  Patient states that she has been taking this to help cope with her husband's death 09-25-16.  I had an extensive discussion with the patient about therapeutic plan.  In order to be considered for chronic opioid therapy here, patient should wean off and discontinue her Klonopin with the help of her primary care physician (esp since she has COPD).  Risk of respiratory depression is increased when patients are on concomitant benzodiazepine and opioid therapy especially in the context of COPD and other respiratory conditions.  Patient is taking a very low dose of Klonopin primarily at night and sometimes twice daily.  I have encouraged her to find an alternative anxiolytic such as trazodone with her primary care physician to manage her anxiety primarily related to her husband's death.  We also discussed increasing her gabapentin since the patient is only taking 100 mg twice daily.  I recommended the patient increase to 300 mg daily for 2 weeks then 300 mg twice daily thereafter.  In regards to her chronic axial low back pain, it is reasonable to obtain updated x-rays of her lumbar, thoracic spine as well as her SI joints given her pain symptoms.  Patient is also endorsing left shoulder pain with left shoulder abduction, recommend left shoulder x-ray.  Patient also describes what our thoracic facet medial branch nerve blocks performed in [place] which were helpful for her thoracic and upper lumbar pain.  She would like to have these repeated.  I would like to wait until we have updated x-rays before delineating an interventional pain plan for her.  Plan: -UDS today.  Standard for new patients.  Should be positive for oxycodone and its metabolites, Klonopin. -Informed patient that she must wean off  and refrain from any benzodiazepines if she is to be considered for chronic opioid therapy here. -X-rays of lumbar, thoracic, bilateral SI joints, left shoulder. -Increase gabapentin to 300 mg daily for 2 weeks and 300 mill grams twice daily thereafter.  Patient's creatinine 0.87. -Depending on patient's ability to find an alternative to Klonopin and patient's UDS, will consider taking the patient on for chronic  opioid therapy: Oxycodone 5 mg 3 times daily as needed, quantity 58-month  However until then, all controlled substances must come from primary care physician.  Patient has not signed an opioid contract with [facility].  Future considerations: -Lumbar facet medial branch nerve blocks, lumbar radio frequency ablation -Thoracic facet medial branch nerve blocks, thoracic radio frequency ablation -Bilateral SI joint injection -Left shoulder steroid injection  Note: Please be advised that as per protocol, today's visit has been an evaluation only. We have not taken over the patient's controlled substance management.  Ordered Lab-work, Procedure(s), Referral(s), & Consult(s): Orders Placed This Encounter  Procedures  . DG Lumbar Spine Complete W/Bend  . DG Thoracic Spine 2 View  . DG Shoulder Left  . DG Si Joints  . Compliance Drug Analysis, Ur   Pharmacotherapy (current): Medications ordered:  Meds ordered this encounter  Medications  . DISCONTD: gabapentin (NEURONTIN) 300 MG capsule    Sig: Take 1 capsule (300 mg total) by mouth every 8 (eight) hours.    Dispense:  90 capsule    Refill:  0    Do not place this medication, or any other prescription from our practice, on "Automatic Refill". Patient may have prescription filled one day early if pharmacy is closed on scheduled refill date.  . gabapentin (NEURONTIN) 300 MG capsule    Sig: 300 mg daily for 2 weeks, then 300 mg twice daily thereafter.    Dispense:  60 capsule    Refill:  0    Do not place this medication, or any other  prescription from our practice, on "Automatic Refill". Patient may have prescription filled one day early if pharmacy is closed on scheduled refill date.   Medications administered during this visit: VEritreaA.Savannah Mcclure"Ann" had no medications administered during this visit.    Provider-requested follow-up: Return in about 1 month (around 01/16/2018) for Medication Management, After Imaging.  Future Appointments  Date Time Provider [place] 01/04/2018  9:30 AM [facility] 01/23/2018 11:45 AM LGillis Santa MD [facility] None  11/23/2018 11:00 AM CFP NURSE HEALTH ADVISOR [facility]    Primary Care Physician: JValerie Roys DO [facility] Note by: BGillis Santa M.D, Date: 12/19/2017; Time: 11:13 AM  Patient Instructions  1.  Urine drug screen today. 2.  Please discuss stopping Klonopin with your primary care physician.  He will need to find another medication to help manage her anxiety if you would like to be considered for chronic opioid therapy here. 3.  X-rays of your lumbar, thoracic, SI joint, left shoulder. 4.  Increase gabapentin so that you are taking 300 mg daily for 2 weeks and increase to 300 mg twice daily. 5.  We have not taking over your chronic opioid management yet.  Once we have your x-rays, urine drug screen, and once you have weaned off and stopped Klonopin, we can consider taking over your chronic opioid regimen of oxycodone 5 mg 3 times daily as needed.  2 Rx for gabapentin have been escribed to your pharmacy.  Pt v/o need to have XRays, as ordered, completed prior to next appt. with pain clinic.

## 2017-12-19 NOTE — Telephone Encounter (Signed)
-----   Message from [name], [place] sent at 12/01/2017  4:00 PM EDT ----- Get CT of chest scheduled to follow up on her pneumonia

## 2017-12-24 LAB — COMPLIANCE DRUG ANALYSIS, UR

## 2018-01-01 ENCOUNTER — Encounter (INDEPENDENT_AMBULATORY_CARE_PROVIDER_SITE_OTHER): Payer: Self-pay

## 2018-01-01 ENCOUNTER — Ambulatory Visit
Admission: RE | Admit: 2018-01-01 | Discharge: 2018-01-01 | Disposition: A | Discharge status: Home / Self Care | Payer: Medicare Other | Source: Ambulatory Visit | Attending: Family Medicine | Admitting: Family Medicine

## 2018-01-01 DIAGNOSIS — K753 Granulomatous hepatitis, not elsewhere classified: Secondary | ICD-10-CM | POA: Insufficient documentation

## 2018-01-01 DIAGNOSIS — Z8701 Personal history of pneumonia (recurrent): Secondary | ICD-10-CM | POA: Insufficient documentation

## 2018-01-01 DIAGNOSIS — J479 Bronchiectasis, uncomplicated: Secondary | ICD-10-CM | POA: Insufficient documentation

## 2018-01-01 DIAGNOSIS — I2510 Atherosclerotic heart disease of native coronary artery without angina pectoris: Secondary | ICD-10-CM | POA: Insufficient documentation

## 2018-01-01 DIAGNOSIS — J189 Pneumonia, unspecified organism: Secondary | ICD-10-CM | POA: Diagnosis not present

## 2018-01-01 DIAGNOSIS — I700 Atherosclerosis of aorta: Secondary | ICD-10-CM | POA: Diagnosis not present

## 2018-01-01 DIAGNOSIS — J181 Lobar pneumonia, unspecified organism: Secondary | ICD-10-CM

## 2018-01-01 DIAGNOSIS — R918 Other nonspecific abnormal finding of lung field: Secondary | ICD-10-CM | POA: Diagnosis not present

## 2018-01-01 DIAGNOSIS — R911 Solitary pulmonary nodule: Secondary | ICD-10-CM | POA: Insufficient documentation

## 2018-01-02 ENCOUNTER — Telehealth: Payer: Self-pay | Admitting: Family Medicine

## 2018-01-02 NOTE — Telephone Encounter (Signed)
Called patient with her results. CT scan showed small nodule in the area where the pneumonia was. Pneumonia is gone. Needs follow up CT in 6-12 months to make sure nodule is not changing. Everything else looks good. OK to give her this message if she calls back. CRM created.

## 2018-01-03 NOTE — Telephone Encounter (Signed)
Called patient and relayed results.

## 2018-01-04 ENCOUNTER — Ambulatory Visit (INDEPENDENT_AMBULATORY_CARE_PROVIDER_SITE_OTHER): Payer: Medicare Other | Admitting: Family Medicine

## 2018-01-04 ENCOUNTER — Encounter: Payer: Self-pay | Admitting: Family Medicine

## 2018-01-04 VITALS — BP 146/67 | HR 113 | Temp 98.8°F | Wt 98.0 lb

## 2018-01-04 DIAGNOSIS — J181 Lobar pneumonia, unspecified organism: Secondary | ICD-10-CM | POA: Diagnosis not present

## 2018-01-04 DIAGNOSIS — G894 Chronic pain syndrome: Secondary | ICD-10-CM | POA: Diagnosis not present

## 2018-01-04 DIAGNOSIS — F419 Anxiety disorder, unspecified: Secondary | ICD-10-CM | POA: Diagnosis not present

## 2018-01-04 DIAGNOSIS — J189 Pneumonia, unspecified organism: Secondary | ICD-10-CM

## 2018-01-04 MED ORDER — HYDROXYZINE HCL 25 MG PO TABS: 25.0000 mg | ORAL_TABLET | Freq: Three times a day (TID) | ORAL | 3 refills | Status: DC | PRN

## 2018-01-04 MED ORDER — OXYCODONE HCL 5 MG PO TABS: 5.0000 mg | ORAL_TABLET | Freq: Four times a day (QID) | ORAL | 0 refills | Status: DC | PRN

## 2018-01-04 MED ORDER — DULOXETINE HCL 20 MG PO CPEP: 20.0000 mg | ORAL_CAPSULE | Freq: Every day | ORAL | 3 refills | Status: DC

## 2018-01-04 NOTE — Assessment & Plan Note (Signed)
Has seen pain management and is trying to get set up with them. Needs to come off her klonopin. Has weaned off. Will refill her oxycodone for now. Follow up 4 weeks for refill if not established with pain management fully by then.

## 2018-01-04 NOTE — Patient Instructions (Signed)
Hydroxyurea oral tablets What is this medicine? HYDROXYUREA (hye drox ee yoor EE a) is a chemotherapy drug. This medicine is used to control the painful crises of sickle cell anemia. This medicine may be used for other purposes; ask your health care provider or pharmacist if you have questions. COMMON BRAND NAME(S): Mylocel What should I tell my health care provider before I take this medicine? They need to know if you have any of these conditions: -gout or high levels or uric acid in the blood -HIV or AIDS -kidney disease or on hemodialysis -leg wounds or ulcers -liver disease -low blood counts, like low white cell, platelet, or red cell counts -prior or current interferon therapy -scheduled to receive a vaccine -an unusual or allergic reaction to hydroxyurea, other medicines, foods, dyes, or preservatives -pregnant or trying to get pregnant -breast-feeding How should I use this medicine? Take this medicine by mouth with a glass of water. If you are not able to swallow the tablet, you can dissolve the dose in a small amount of water in a teaspoon and swallow it right away. Follow the directions on the prescription label. Take your medicine at regular intervals. Do not take it more often than directed. Do not stop taking except on your doctor's advice. People who are not taking this medicine should not be exposed to it. Wash your hands before and after handling your bottle or medicine. Wear disposable gloves when touching the bottle or medicine. Clean up any medicine powder that spills with a damp disposable towel and throw the towel away in a closed container, such as a plastic bag. Talk to your pediatrician regarding the use of this medicine in children. While this drug may be prescribed for children as young as 2 years for selected conditions, precautions do apply. Overdosage: If you think you have taken too much of this medicine contact a poison control center or emergency room at  once. NOTE: This medicine is only for you. Do not share this medicine with others. What if I miss a dose? If you miss a dose, take it as soon as you can. If it is almost time for your next dose, take only that dose. Do not take double or extra doses. What may interact with this medicine? This medicine may interact with the following medications: -didanosine -live virus vaccines -stavudine This list may not describe all possible interactions. Give your health care provider a list of all the medicines, herbs, non-prescription drugs, or dietary supplements you use. Also tell them if you smoke, drink alcohol, or use illegal drugs. Some items may interact with your medicine. What should I watch for while using this medicine? Tell your doctor or healthcare professional if your symptoms do not start to get better or if they get worse. Call your doctor or health care professional for advice if you get a fever, chills or sore throat, or other symptoms of a cold or flu. Do not treat yourself. This drug decreases your body's ability to fight infections. Try to avoid being around people who are sick. This medicine may increase your risk to bruise or bleed. Call your doctor or health care professional if you notice any unusual bleeding. Keep out of the sun. If you cannot avoid being in the sun, wear protective clothing and use sunscreen. Do not use sun lamps or tanning beds/booths. Talk to your doctor about your risk of cancer. You may be more at risk for certain types of cancers if you take this medicine.  Do not become pregnant while taking this medicine or for at least 6 months after stopping it. Women should inform their doctor if they wish to become pregnant or think they might be pregnant. Men should not father a child while taking this medicine and for at least 6 months after stopping it. There is a potential for serious side effects to an unborn child. Talk to your health care professional or pharmacist  for more information. Do not breast-feed an infant while taking this medicine. This may interfere with the ability to have or father a child. You should talk with your doctor or health care professional if you are concerned about your fertility. What side effects may I notice from receiving this medicine? Side effects that you should report to your doctor or health care professional as soon as possible: -allergic reactions like skin rash, itching or hives, swelling of the face, lips, or tongue -breathing problems -low blood counts - this medicine may decrease the number of white blood cells, red blood cells and platelets. You may be at increased risk for infections and bleeding -signs of decreased platelets or bleeding - bruising, pinpoint red spots on the skin, black, tarry stools, blood in the urine -signs of decreased red blood cells - unusually weak or tired, feeling faint or lightheaded, falls -signs of infection - fever or chills, cough, sore throat, pain or difficulty passing urine -skin ulcers Side effects that usually do not require medical attention (report these to your doctor or health care professional if they continue or are bothersome): -changes in skin and nail color -constipation -dry skin -headache -nausea, vomiting -weight gain This list may not describe all possible side effects. Call your doctor for medical advice about side effects. You may report side effects to FDA at 1-800-FDA-1088. Where should I keep my medicine? Keep out of the reach of children. Store at room temperature between 15 and 30 degrees C (59 and 86 degrees F). Throw away any unused medicine after the expiration date. NOTE: This sheet is a summary. It may not cover all possible information. If you have questions about this medicine, talk to your doctor, pharmacist, or health care provider.  2018 Elsevier/Gold Standard (2016-08-10 11:19:18) Duloxetine delayed-release capsules What is this  medicine? DULOXETINE (doo LOX e teen) is used to treat depression, anxiety, and different types of chronic pain. This medicine may be used for other purposes; ask your health care provider or pharmacist if you have questions. COMMON BRAND NAME(S): Cymbalta, [place] What should I tell my health care provider before I take this medicine? They need to know if you have any of these conditions: -bipolar disorder or a family history of bipolar disorder -glaucoma -kidney disease -liver disease -suicidal thoughts or a previous suicide attempt -taken medicines called MAOIs like Carbex, Eldepryl, Marplan, Nardil, and Parnate within 14 days -an unusual reaction to duloxetine, other medicines, foods, dyes, or preservatives -pregnant or trying to get pregnant -breast-feeding How should I use this medicine? Take this medicine by mouth with a glass of water. Follow the directions on the prescription label. Do not cut, crush or chew this medicine. You can take this medicine with or without food. Take your medicine at regular intervals. Do not take your medicine more often than directed. Do not stop taking this medicine suddenly except upon the advice of your doctor. Stopping this medicine too quickly may cause serious side effects or your condition may worsen. A special MedGuide will be given to you by the pharmacist  with each prescription and refill. Be sure to read this information carefully each time. Talk to your pediatrician regarding the use of this medicine in children. While this drug may be prescribed for children as young as 70 years of age for selected conditions, precautions do apply. Overdosage: If you think you have taken too much of this medicine contact a poison control center or emergency room at once. NOTE: This medicine is only for you. Do not share this medicine with others. What if I miss a dose? If you miss a dose, take it as soon as you can. If it is almost time for your next dose, take  only that dose. Do not take double or extra doses. What may interact with this medicine? Do not take this medicine with any of the following medications: -desvenlafaxine -levomilnacipran -linezolid -MAOIs like Carbex, Eldepryl, Marplan, Nardil, and Parnate -methylene blue (injected into a vein) -milnacipran -thioridazine -venlafaxine This medicine may also interact with the following medications: -alcohol -amphetamines -aspirin and aspirin-like medicines -certain antibiotics like ciprofloxacin and enoxacin -certain medicines for blood pressure, heart disease, irregular heart beat -certain medicines for depression, anxiety, or psychotic disturbances -certain medicines for migraine headache like almotriptan, eletriptan, frovatriptan, naratriptan, rizatriptan, sumatriptan, zolmitriptan -certain medicines that treat or prevent blood clots like warfarin, enoxaparin, and dalteparin -cimetidine -fentanyl -lithium -NSAIDS, medicines for pain and inflammation, like ibuprofen or naproxen -phentermine -procarbazine -rasagiline -sibutramine -St. John's wort -theophylline -tramadol -tryptophan This list may not describe all possible interactions. Give your health care provider a list of all the medicines, herbs, non-prescription drugs, or dietary supplements you use. Also tell them if you smoke, drink alcohol, or use illegal drugs. Some items may interact with your medicine. What should I watch for while using this medicine? Tell your doctor if your symptoms do not get better or if they get worse. Visit your doctor or health care professional for regular checks on your progress. Because it may take several weeks to see the full effects of this medicine, it is important to continue your treatment as prescribed by your doctor. Patients and their families should watch out for new or worsening thoughts of suicide or depression. Also watch out for sudden changes in feelings such as feeling anxious,  agitated, panicky, irritable, hostile, aggressive, impulsive, severely restless, overly excited and hyperactive, or not being able to sleep. If this happens, especially at the beginning of treatment or after a change in dose, call your health care professional. Dennis Bast may get drowsy or dizzy. Do not drive, use machinery, or do anything that needs mental alertness until you know how this medicine affects you. Do not stand or sit up quickly, especially if you are an older patient. This reduces the risk of dizzy or fainting spells. Alcohol may interfere with the effect of this medicine. Avoid alcoholic drinks. This medicine can cause an increase in blood pressure. This medicine can also cause a sudden drop in your blood pressure, which may make you feel faint and increase the chance of a fall. These effects are most common when you first start the medicine or when the dose is increased, or during use of other medicines that can cause a sudden drop in blood pressure. Check with your doctor for instructions on monitoring your blood pressure while taking this medicine. Your mouth may get dry. Chewing sugarless gum or sucking hard candy, and drinking plenty of water may help. Contact your doctor if the problem does not go away or is severe. What side effects  may I notice from receiving this medicine? Side effects that you should report to your doctor or health care professional as soon as possible: -allergic reactions like skin rash, itching or hives, swelling of the face, lips, or tongue -anxious -breathing problems -confusion -changes in vision -chest pain -confusion -elevated mood, decreased need for sleep, racing thoughts, impulsive behavior -eye pain -fast, irregular heartbeat -feeling faint or lightheaded, falls -feeling agitated, angry, or irritable -hallucination, loss of contact with reality -high blood pressure -loss of balance or coordination -palpitations -redness, blistering, peeling or  loosening of the skin, including inside the mouth -restlessness, pacing, inability to keep still -seizures -stiff muscles -suicidal thoughts or other mood changes -trouble passing urine or change in the amount of urine -trouble sleeping -unusual bleeding or bruising -unusually weak or tired -vomiting -yellowing of the eyes or skin Side effects that usually do not require medical attention (report to your doctor or health care professional if they continue or are bothersome): -change in sex drive or performance -change in appetite or weight -constipation -dizziness -dry mouth -headache -increased sweating -nausea -tired This list may not describe all possible side effects. Call your doctor for medical advice about side effects. You may report side effects to FDA at 1-800-FDA-1088. Where should I keep my medicine? Keep out of the reach of children. Store at room temperature between 20 and 25 degrees C (68 to 77 degrees F). Throw away any unused medicine after the expiration date. NOTE: This sheet is a summary. It may not cover all possible information. If you have questions about this medicine, talk to your doctor, pharmacist, or health care provider.  2018 Elsevier/Gold Standard (2015-12-31 18:16:03)

## 2018-01-04 NOTE — Assessment & Plan Note (Signed)
Not under great control. Cannot take benzos due to pain medicine. Has never tried preventative medicine. Will start her on cymbalta and hydroxyzine for anxiety. Recheck 2-4 weeks for tolerance. Call with any concerns.

## 2018-01-04 NOTE — Progress Notes (Signed)
BP (!) 146/67 (BP Location: Right Arm, Patient Position: Sitting, Cuff Size: Normal)   Pulse (!) 113   Temp 98.8 F (37.1 C)   Wt 98 lb (44.5 kg)   SpO2 95%   BMI 18.52 kg/m    Subjective:    Patient ID: Savannah Mcclure, female    DOB: 10-14-1940, 77 y.o.   MRN: [id number]  HPI: Savannah Mcclure is a 77 y.o. female  Chief Complaint  Patient presents with  . lung recheck  . Anxiety    Patient states that she can not take the klonopin any longer because Dr.Lateef will stop prescribing her pain medication, so she needs a different anxiety medication  . Pain    Dr.Lateef told her to increase her gabapentin to 200mg  BID   CT done 2 days ago showed 42mm nodule in RUL as well as some scarring. Pneumonia has resolved. She notes that she is feeling much better. Cough is better. Still tired, but slowly improving.   Saw Dr. Holley Raring for her chronic pain. He will not take over chronic pain while she is on benzodiazepines. Has been taking klonopin 2x a day to help cope with the death of her husband who passed suddenly in Jan 2018. He increased her gabapentin.   ANXIETY/STRESS- had been taking her klonopin 2x a day. Definitely needs 1 a night to help shut down her mind as she is worrying. Since she moved in with her family, she finds that her anxiety to be worse. Has a lot of social stressors.  Duration:exacerbated Anxious mood: yes  Excessive worrying: yes Irritability: no  Sweating: no Nausea: no Palpitations:no Hyperventilation: no Panic attacks: no Agoraphobia: no  Obscessions/compulsions: yes Depressed mood: yes Depression screen [facility] 2/9 01/04/2018 12/19/2017 11/20/2017 10/30/2017  Decreased Interest 1 0 3 1  Down, Depressed, Hopeless 1 1 1 1   PHQ - 2 Score 2 1 4 2   Altered sleeping 2 - 3 1  Tired, decreased energy 2 - 3 3  Change in appetite 2 - 0 2  Feeling bad or failure about yourself  0 - 0 0  Trouble concentrating 0 - 0 0  Moving slowly or fidgety/restless 1 -  0 0  Suicidal thoughts 0 - 0 0  PHQ-9 Score 9 - 10 8  Difficult doing work/chores Somewhat difficult - Somewhat difficult -   GAD 7 : Generalized Anxiety Score 01/04/2018  Nervous, Anxious, on Edge 1  Control/stop worrying 2  Worry too much - different things 3  Trouble relaxing 2  Restless 1  Easily annoyed or irritable 2  Afraid - awful might happen 0  Total GAD 7 Score 11  Anxiety Difficulty Somewhat difficult   Anhedonia: no Weight changes: no Insomnia: yes hard to fall asleep  Hypersomnia: no Fatigue/loss of energy: yes Feelings of worthlessness: no Feelings of guilt: no Impaired concentration/indecisiveness: no Suicidal ideations: no  Crying spells: no Recent Stressors/Life Changes: yes   Relationship problems: yes   Family stress: yes     Financial stress: yes    Job stress: no    Recent death/loss: yes  CHRONIC PAIN  Present dose: 30 Morphine equivalents Pain control status: stable Duration: chronic Location: low back and hips Quality: dull, sharp, aching pain, "freezes with pain" "nerves will not transmit the message" Current Pain Level: 4/10 Previous Pain Level: severe Breakthrough pain: yes Benefit from narcotic medications: yes What Activities task can be accomplished with current medication?- able to care for herself and her  grandchildren Interested in weaning off narcotics:no   Stool softners/OTC fiber: no  Previous pain specialty evaluation: yes Non-narcotic analgesic meds: yes- gabapentin Narcotic contract: yes  Relevant past medical, surgical, family and social history reviewed and updated as indicated. Interim medical history since our last visit reviewed. Allergies and medications reviewed and updated.  Review of Systems  Constitutional: Negative.   Respiratory: Negative.   Cardiovascular: Negative.   Musculoskeletal: Positive for arthralgias, back pain and myalgias. Negative for gait problem, joint swelling, neck pain and neck stiffness.    Skin: Negative.   Neurological: Negative.   Hematological: Negative.   Psychiatric/Behavioral: Negative.     Per HPI unless specifically indicated above     Objective:    BP (!) 146/67 (BP Location: Right Arm, Patient Position: Sitting, Cuff Size: Normal)   Pulse (!) 113   Temp 98.8 F (37.1 C)   Wt 98 lb (44.5 kg)   SpO2 95%   BMI 18.52 kg/m   Wt Readings from Last 3 Encounters:  01/04/18 98 lb (44.5 kg)  12/19/17 99 lb (44.9 kg)  12/12/17 99 lb (44.9 kg)    Physical Exam  Constitutional: She is oriented to person, place, and time. She appears well-developed and well-nourished. No distress.  HENT:  Head: Normocephalic and atraumatic.  Right Ear: Hearing normal.  Left Ear: Hearing normal.  Nose: Nose normal.  Eyes: Conjunctivae and lids are normal. Right eye exhibits no discharge. Left eye exhibits no discharge. No scleral icterus.  Cardiovascular: Normal rate, regular rhythm, normal heart sounds and intact distal pulses. Exam reveals no gallop and no friction rub.  No murmur heard. Pulmonary/Chest: Effort normal and breath sounds normal. No stridor. No respiratory distress. She has no wheezes. She has no rales.  Musculoskeletal: Normal range of motion.  Neurological: She is alert and oriented to person, place, and time.  Skin: Skin is warm, dry and intact. Capillary refill takes less than 2 seconds. No rash noted. She is not diaphoretic. No erythema. No pallor.  Psychiatric: She has a normal mood and affect. Her speech is normal and behavior is normal. Judgment and thought content normal. Cognition and memory are normal.  Nursing note and vitals reviewed.   Results for orders placed or performed in visit on 12/19/17  Compliance Drug Analysis, Ur  Result Value Ref Range   Summary FINAL       Assessment & Plan:   Problem List Items Addressed This Visit      Other   Anxiety    Not under great control. Cannot take benzos due to pain medicine. Has never tried  preventative medicine. Will start her on cymbalta and hydroxyzine for anxiety. Recheck 2-4 weeks for tolerance. Call with any concerns.       Relevant Medications   DULoxetine (CYMBALTA) 20 MG capsule   hydrOXYzine (ATARAX/VISTARIL) 25 MG tablet   Chronic pain syndrome    Has seen pain management and is trying to get set up with them. Needs to come off her klonopin. Has weaned off. Will refill her oxycodone for now. Follow up 4 weeks for refill if not established with pain management fully by then.        Other Visit Diagnoses    Pneumonia of right middle lobe due to infectious organism [facility])    -  Primary   Resolved on CT. Continue to monitor.       Follow up plan: Return in about 1 month (around 02/01/2018) for follow up mood and pain.

## 2018-01-09 ENCOUNTER — Telehealth: Payer: Self-pay | Admitting: Family Medicine

## 2018-01-09 NOTE — Telephone Encounter (Signed)
LOV:  12/19/17 with [facility]   ///  Last refill of Lotensin 20 mg.   10/20/17 Last refill of Neurontin 300mg   12/19/17 Last refill of Pletal 100mg : 08/25/17   ///   Request RX be sent to [facility]  [address]   [place] , [place].

## 2018-01-09 NOTE — Telephone Encounter (Signed)
Copied from [facility] [id number]. Topic: Quick Communication - Rx Refill/Question >> Jan 09, 2018 10:29 AM Antonieta Iba C wrote: Medication: benazepril (LOTENSIN) 20 MG tablet, cilostazol (PLETAL) 100 MG tablet, gabapentin (NEURONTIN) 300 MG capsule  Has the patient contacted their pharmacy? No  (Agent: If no, request that the patient contact the pharmacy for the refill.)  (Agent: If yes, when and what did the pharmacy advise?)  Preferred Pharmacy (with phone number or street name): [facility] [place], [place]: Please be advised that RX refills may take up to 3 business days. We ask that you follow-up with your pharmacy.

## 2018-01-10 ENCOUNTER — Other Ambulatory Visit: Payer: Self-pay | Admitting: Family Medicine

## 2018-01-10 MED ORDER — CILOSTAZOL 100 MG PO TABS: 100.0000 mg | ORAL_TABLET | Freq: Two times a day (BID) | ORAL | 1 refills | Status: DC

## 2018-01-10 NOTE — Telephone Encounter (Signed)
Pletal sent to her pharmacy. Years supply of lotensin given in March. Not due.  Dr. Holley Raring writes her neurontin- will need to contact him for that.

## 2018-01-11 NOTE — Telephone Encounter (Signed)
Called and left patient a VM asking for her to please return my call. OK for [facility] to speak to patient and let her know what Dr. Wynetta Emery said about medications if she calls back.

## 2018-01-12 ENCOUNTER — Telehealth: Payer: Self-pay | Admitting: *Deleted

## 2018-01-12 NOTE — Telephone Encounter (Signed)
Talked to patient , he is unable to get [facility]. New Gab[facility]entin order called in to [facility]

## 2018-01-12 NOTE — Telephone Encounter (Signed)
Spoke with patient and informed her that the Gabapentin was sent to her pharmacy on 12-21-17 at 1020 and confirmation was received. Patient states [facility].

## 2018-01-17 MED ORDER — CILOSTAZOL 100 MG PO TABS: 100.0000 mg | ORAL_TABLET | Freq: Two times a day (BID) | ORAL | 3 refills | Status: DC

## 2018-01-17 NOTE — Addendum Note (Signed)
Addended by: [name] on: 01/17/2018 10:07 AM   Modules accepted: Orders

## 2018-01-17 NOTE — Telephone Encounter (Signed)
The pt called and stated that she only had 4 pills left and would like to have a short term supply sent to [facility].

## 2018-01-23 ENCOUNTER — Other Ambulatory Visit: Payer: Self-pay

## 2018-01-23 ENCOUNTER — Ambulatory Visit
Payer: Medicare Other | Attending: Student in an Organized Health Care Education/Training Program | Admitting: Student in an Organized Health Care Education/Training Program

## 2018-01-23 ENCOUNTER — Encounter: Payer: Self-pay | Admitting: Student in an Organized Health Care Education/Training Program

## 2018-01-23 VITALS — BP 134/66 | HR 128 | Temp 98.0°F | Ht 64.0 in | Wt 99.0 lb

## 2018-01-23 DIAGNOSIS — M47816 Spondylosis without myelopathy or radiculopathy, lumbar region: Secondary | ICD-10-CM

## 2018-01-23 DIAGNOSIS — Z9889 Other specified postprocedural states: Secondary | ICD-10-CM | POA: Diagnosis not present

## 2018-01-23 DIAGNOSIS — Z888 Allergy status to other drugs, medicaments and biological substances status: Secondary | ICD-10-CM | POA: Insufficient documentation

## 2018-01-23 DIAGNOSIS — Z7951 Long term (current) use of inhaled steroids: Secondary | ICD-10-CM | POA: Diagnosis not present

## 2018-01-23 DIAGNOSIS — M25551 Pain in right hip: Secondary | ICD-10-CM

## 2018-01-23 DIAGNOSIS — M48062 Spinal stenosis, lumbar region with neurogenic claudication: Secondary | ICD-10-CM | POA: Diagnosis not present

## 2018-01-23 DIAGNOSIS — Z882 Allergy status to sulfonamides status: Secondary | ICD-10-CM | POA: Insufficient documentation

## 2018-01-23 DIAGNOSIS — M25552 Pain in left hip: Secondary | ICD-10-CM | POA: Diagnosis not present

## 2018-01-23 DIAGNOSIS — Z881 Allergy status to other antibiotic agents status: Secondary | ICD-10-CM | POA: Diagnosis not present

## 2018-01-23 DIAGNOSIS — G894 Chronic pain syndrome: Secondary | ICD-10-CM | POA: Diagnosis not present

## 2018-01-23 DIAGNOSIS — Z90710 Acquired absence of both cervix and uterus: Secondary | ICD-10-CM | POA: Insufficient documentation

## 2018-01-23 DIAGNOSIS — Z79899 Other long term (current) drug therapy: Secondary | ICD-10-CM | POA: Diagnosis not present

## 2018-01-23 DIAGNOSIS — M5136 Other intervertebral disc degeneration, lumbar region: Secondary | ICD-10-CM | POA: Diagnosis not present

## 2018-01-23 DIAGNOSIS — M47814 Spondylosis without myelopathy or radiculopathy, thoracic region: Secondary | ICD-10-CM | POA: Diagnosis not present

## 2018-01-23 DIAGNOSIS — F17210 Nicotine dependence, cigarettes, uncomplicated: Secondary | ICD-10-CM | POA: Diagnosis not present

## 2018-01-23 DIAGNOSIS — E739 Lactose intolerance, unspecified: Secondary | ICD-10-CM | POA: Insufficient documentation

## 2018-01-23 DIAGNOSIS — M4120 Other idiopathic scoliosis, site unspecified: Secondary | ICD-10-CM | POA: Diagnosis not present

## 2018-01-23 DIAGNOSIS — Z886 Allergy status to analgesic agent status: Secondary | ICD-10-CM | POA: Insufficient documentation

## 2018-01-23 DIAGNOSIS — F1190 Opioid use, unspecified, uncomplicated: Secondary | ICD-10-CM | POA: Diagnosis not present

## 2018-01-23 MED ORDER — OXYCODONE HCL 5 MG PO TABS: 5.0000 mg | ORAL_TABLET | Freq: Three times a day (TID) | ORAL | 0 refills | Status: DC | PRN

## 2018-01-23 MED ORDER — GABAPENTIN 300 MG PO CAPS: 300.0000 mg | ORAL_CAPSULE | Freq: Two times a day (BID) | ORAL | 3 refills | Status: DC

## 2018-01-23 NOTE — Progress Notes (Signed)
Safety precautions to be maintained throughout the outpatient stay will include: orient to surroundings, keep bed in low position, maintain call bell within reach at all times, provide assistance with transfer out of bed and ambulation.  

## 2018-01-23 NOTE — Progress Notes (Signed)
Patient's Name: Savannah Mcclure  MRN: [id number]  Referring Provider: [name], DO  DOB: February 27, 1941  PCP: [name], DO  DOS: 01/23/2018  Note by: Gillis Santa, MD  Service setting: Ambulatory outpatient  Specialty: Interventional Pain Management  Location: [facility] (AMB) Pain Management Facility    Patient type: Established   Primary Reason(s) for Visit: Encounter for evaluation before starting new chronic pain management plan of care (Level of risk: moderate) CC: Back Pain (lower)  HPI  Savannah Mcclure is a 77 y.o. year old, female patient, who comes today for a follow-up evaluation to review the test results and decide on a treatment plan. She has HTN (hypertension); COPD (chronic obstructive pulmonary disease) ([place]); History of tobacco abuse; Scoliosis (and kyphoscoliosis), idiopathic; Spinal stenosis of lumbar region with neurogenic claudication; PAD (peripheral artery disease) ([place]); Skin lesions; Vision loss; Tachycardia; Anxiety; Anemia; Hypokalemia; Abnormal weight loss; Controlled substance agreement signed; Lumbar degenerative disc disease; Chronic, continuous use of opioids; Chronic left shoulder pain; Pain of both hip joints; and Chronic pain syndrome on their problem list. Her primarily concern today is the Back Pain (lower)  Pain Assessment: Location: Lower Back Radiating: denies Onset:   Duration: Chronic pain Quality: Aching, Constant, Dull, Sharp Severity: 6 /10 (subjective, self-reported pain score)  Note: Reported level is compatible with observation.                         When using our objective Pain Scale, levels between 6 and 10/10 are said to belong in an emergency room, as it progressively worsens from a 6/10, described as severely limiting, requiring emergency care not usually available at an outpatient pain management facility. At a 6/10 level, communication becomes difficult and requires great effort. Assistance to reach the emergency department  may be required. Facial flushing and profuse sweating along with potentially dangerous increases in heart rate and blood pressure will be evident. Effect on ADL: limited activity, painful to walk Timing: Constant Modifying factors: sitting, reclining BP: 134/66  HR: (!) 128  Savannah Mcclure comes in today for a follow-up visit after her initial evaluation on 01/12/2018. Today we went over the results of her tests. These were explained in "Layman's terms". During today's appointment we went over my diagnostic impression, as well as the proposed treatment plan.  Patient presents today for second visit.  She has effectively weaned off of her Klonopin which I commended her on.  Patient is finding benefit with increased dose of gabapentin at 300 mg twice daily.  She is not having any side effects of lower extremity swelling or vivid dreams.  She does endorse occasional dizziness which may or may not be due to the gabapentin.  Patient has completed x-rays that were ordered at the last visit with results below.  She continues oxycodone 5 mg twice daily to 3 times daily as needed.  Patient states that many years ago she had thoracic and lumbar facet medial branch nerve blocks which she found effective.  She is interested in repeating these.    In considering the treatment plan options, Savannah Mcclure was reminded that I no longer take patients for medication management only. I asked her to let me know if she had no intention of taking advantage of the interventional therapies, so that we could make arrangements to provide this space to someone interested. I also made it clear that undergoing interventional therapies for the purpose of getting pain medications  is very inappropriate on the part of a patient, and it will not be tolerated in this practice. This type of behavior would suggest true addiction and therefore it requires referral to an addiction specialist.   Further details on both, my  assessment(s), as well as the proposed treatment plan, please see below.  Controlled Substance Pharmacotherapy Assessment REMS (Risk Evaluation and Mitigation Strategy)  Analgesic: Oxycodone 5 mg 3 times daily as needed, quantity 90/month MME/day: Approximately 25 mg/day. Pill Count: None expected due to no prior prescriptions written by our practice. Rise Patience, RN  01/23/2018 12:04 PM  Sign at close encounter Safety precautions to be maintained throughout the outpatient stay will include: orient to surroundings, keep bed in low position, maintain call bell within reach at all times, provide assistance with transfer out of bed and ambulation.  Pharmacokinetics: Liberation and absorption (onset of action): WNL Distribution (time to peak effect): WNL Metabolism and excretion (duration of action): WNL         Pharmacodynamics: Desired effects: Analgesia: Savannah Mcclure reports >50% benefit. Functional ability: Patient reports that medication allows her to accomplish basic ADLs Clinically meaningful improvement in function (CMIF): Sustained CMIF goals met Perceived effectiveness: Described as relatively effective, allowing for increase in activities of daily living (ADL) Undesirable effects: Side-effects or Adverse reactions: None reported Monitoring: [place] PMP: Online review of the past 80-monthperiod previously conducted. Not applicable at this point since we have not taken over the patient's medication management yet. List of other Serum/Urine Drug Screening Test(s):  No results found for: AMPHSCRSER, BARBSCRSER, BENZOSCRSER, COCAINSCRSER, COCAINSCRNUR, PCPSCRSER, THCSCRSER, THCU, CANNABQUANT, [place] EM[place]venList of all UDS test(s) done:  Lab Results  Component Value Date   SUMMARY FINAL 12/19/2017   Last UDS on record: Summary  Date Value Ref Range Status  12/19/2017 FINAL  Final    Comment:     ==================================================================== TOXASSURE COMP DRUG ANALYSIS,UR ==================================================================== Test                             Result       Flag       Units Drug Present and Declared for Prescription Verification   7-aminoclonazepam              569          EXPECTED   ng/mg creat    7-aminoclonazepam is an expected metabolite of clonazepam. Source    of clonazepam is a scheduled prescription medication.   Oxycodone                      1735         EXPECTED   ng/mg creat   Oxymorphone                    1523         EXPECTED   ng/mg creat   Noroxycodone                   962          EXPECTED   ng/mg creat   Noroxymorphone                 331          EXPECTED   ng/mg creat    Sources of oxycodone are scheduled prescription medications.    Oxymorphone, noroxycodone, and noroxymorphone are expected    metabolites  of oxycodone. Oxymorphone is also available as a    scheduled prescription medication.   Gabapentin                     PRESENT      EXPECTED Drug Absent but Declared for Prescription Verification   Lidocaine                      Not Detected UNEXPECTED    Lidocaine, as indicated in the declared medication list, is not    always detected even when used as directed.   Diltiazem                      Not Detected UNEXPECTED ==================================================================== Test                      Result    Flag   Units      Ref Range   Creatinine              26               mg/dL      >=20 ==================================================================== Declared Medications:  The flagging and interpretation on this report are based on the  following declared medications.  Unexpected results may arise from  inaccuracies in the declared medications.  **Note: The testing scope of this panel includes these medications:  Clonazepam  Diltiazem  Gabapentin  Oxycodone (Roxicodone)   **Note: The testing scope of this panel does not include small to  moderate amounts of these reported medications:  Lidocaine (Lidoderm)  Lidocaine (Xylocaine)  **Note: The testing scope of this panel does not include following  reported medications:  Albuterol (Duoneb)  Benazepril (Lotensin)  Cilostazol (Pletal)  Fluticasone  Ipratropium (Duoneb)  Loperamide (Imodium A-D)  Multivitamin  Mupirocin (Bactroban)  Supplement  Tiotropium (Spiriva)  Vitamin B ==================================================================== For clinical consultation, please call [phone]. ====================================================================    UDS interpretation: No unexpected findings.          Medication Assessment Form: Patient introduced to form today Treatment compliance: Treatment may start today if patient agrees with proposed plan. Evaluation of compliance is not applicable at this point Risk Assessment Profile: Aberrant behavior: See initial evaluations. None observed or detected today Comorbid factors increasing risk of overdose: See initial evaluation. No additional risks detected today Medical Psychology Evaluation: Low Risk Opioid Risk Tool - 01/23/18 1202      Family History of Substance Abuse   Alcohol  Negative    Illegal Drugs  Positive Female    Rx Drugs  Negative      Personal History of Substance Abuse   Alcohol  Negative    Illegal Drugs  Negative    Rx Drugs  Negative      Age   Age between 88-45 years   No      Psychological Disease   Psychological Disease  Negative    Depression  Positive      Total Score   Opioid Risk Tool Scoring  3    Opioid Risk Interpretation  Low Risk      ORT Scoring interpretation table:  Score <3 = Low Risk for SUD  Score between 4-7 = Moderate Risk for SUD  Score >8 = High Risk for Opioid Abuse   Risk Mitigation Strategies:  Patient opioid safety counseling: Completed today. Counseling provided to patient  as per "Patient Counseling Document". Document signed by patient, attesting  to counseling and understanding Patient-Prescriber Agreement (PPA): Obtained today.  Controlled substance notification to other providers: Written and sent today.  Pharmacologic Plan: Today we may be taking over the patient's pharmacological regimen. See below.             Laboratory Chemistry  Inflammation Markers (CRP: Acute Phase) (ESR: Chronic Phase) Lab Results  Component Value Date   LATICACIDVEN 1.8 10/09/2017                         Rheumatology Markers No results found for: RF, ANA, LABURIC, URICUR, LYMEIGGIGMAB, LYMEABIGMQN, HLAB27                      Renal Function Markers Lab Results  Component Value Date   BUN 17 10/31/2017   CREATININE 0.87 10/31/2017   BCR 16 10/30/2017   GFRAA >60 10/31/2017   GFRNONAA >60 10/31/2017                             Hepatic Function Markers Lab Results  Component Value Date   AST 13 10/30/2017   ALT 9 10/30/2017   ALBUMIN 4.4 10/30/2017   ALKPHOS 82 10/30/2017                        Electrolytes Lab Results  Component Value Date   NA 137 10/31/2017   K 3.4 (L) 10/31/2017   CL 101 10/31/2017   CALCIUM 8.9 10/31/2017   MG 1.8 10/13/2017   PHOS 3.2 10/13/2017                        Neuropathy Markers No results found for: VITAMINB12, FOLATE, HGBA1C, HIV                      Bone Pathology Markers No results found for: VD25OH, VD125OH2TOT, [id number], QQ5956LO7, 25OHVITD1, 25OHVITD2, 25OHVITD3, TESTOFREE, TESTOSTERONE                       Coagulation Parameters Lab Results  Component Value Date   INR 1.07 10/07/2017   LABPROT 13.8 10/07/2017   PLT 412 10/31/2017                        Cardiovascular Markers Lab Results  Component Value Date   BNP 326 (H) 09/10/2012   CKTOTAL 65 09/10/2012   CKMB < 0.5 (L) 09/10/2012   TROPONINI <0.03 10/31/2017   HGB 11.1 (L) 10/31/2017   HCT 33.8 (L) 10/31/2017                         CA  Markers No results found for: CEA, CA125, LABCA2                      Note: Lab results reviewed.  Recent Diagnostic Imaging Review    Shoulder-L DG:  Results for orders placed during the hospital encounter of 12/19/17  DG Shoulder Left   Narrative CLINICAL DATA:  Chronic left shoulder pain.  EXAM: LEFT SHOULDER - 2+ VIEW  COMPARISON:  None.  FINDINGS: No fracture or dislocation. Glenohumeral and acromioclavicular joint spaces appear preserved. No evidence of calcific tendinitis. Limited visualization of the adjacent thorax is normal. Regional soft tissues appear normal.  IMPRESSION: No explanation  for patient's chronic left shoulder pain.   Electronically Signed   By: Sandi Mariscal M.D.   On: 12/19/2017 16:10      Thoracic DG 2-3 views:  Results for orders placed during the hospital encounter of 12/19/17  DG Thoracic Spine 2 View   Narrative CLINICAL DATA:  Scoliosis and chronic back pain  EXAM: THORACIC SPINE 2 VIEWS  COMPARISON:  Lumbar spine radiographs-earlier same day  FINDINGS: There are apparently 12 rib-bearing thoracic vertebral bodies.  Severe scoliotic curvature of the thoracolumbar spine with dominant caudal component convex to the left measuring approximately 51 degrees (as measured from the superior endplate of T7 to the superior endplate of L4.  Intervertebral disc space replacement of the lower lumbar spine incompletely imaged.  Thoracic vertebral body heights appear grossly preserved given obliquity. No definite anterolisthesis or retrolisthesis.  Stigmata of DISH within thoracic spine. Thoracic vertebral disc spaces appear grossly preserved.  Limited visualization adjacent thorax demonstrates atherosclerotic plaque within thoracic aorta. Regional soft tissues appear normal.  IMPRESSION: Severe scoliotic curvature of the thoracolumbar spine with dominant caudal component convex to the left measuring 51 degrees.   Electronically  Signed   By: Sandi Mariscal M.D.   On: 12/19/2017 16:09     Lumbar DG Bending views:  Results for orders placed during the hospital encounter of 12/19/17  DG Lumbar Spine Complete W/Bend   Narrative CLINICAL DATA:  Scoliosis and chronic back pain.  EXAM: LUMBAR SPINE - COMPLETE WITH BENDING VIEWS  COMPARISON:  Thoracic spine radiographs-earlier same day  FINDINGS: Osteopenia.  There are apparently 5 non rib-bearing lumbar type vertebral bodies.  Severe scoliotic curvature of the thoracolumbar spine with dominant caudal component convex to the left measuring approximately 55 degrees (as measured from the superior endplate of B44 to the inferior endplate of L4).  Evaluation for anterolisthesis and retrolisthesis is markedly degraded secondary to a combination patient's osteopenia as well as severe scoliotic curvature, however there are no definitive areas of significant anterolisthesis or retrolisthesis given the acquired degrees of flexion and extension.  Post L3-L4, L4-L5 and L5-S1 intervertebral disc space replacement as well as L5-S1 anterior fusion without definite evidence of hardware failure or loosening.  Vertebral body heights appear grossly preserved given obliquity.  At least moderate multilevel DDD within the remainder of the lumbar spine.  Limited visualization of the bilateral SI joints and hips is normal given.  Large colonic stool burden without evidence of enteric obstruction. Multiple phleboliths overlie the lower pelvis. Limited visualization of lower thorax is normal.  IMPRESSION: 1. Severe scoliotic curvature of the thoracolumbar spine. 2. Post L3-L4, L4-L5 and L5-S1 intervertebral disc space replacement as well as L5-S1 anterior fusion without definite evidence of hardware failure or loosening. 3. There is moderate multilevel DDD within the remainder of the lumbar spine. 4. No definite evidence of dynamic lumbar spine instability given the  acquired degrees of flexion extension though evaluation of markedly degraded secondary severe scoliosis.   Electronically Signed   By: Sandi Mariscal M.D.   On: 12/19/2017 16:05     Sacroiliac Joint Imaging: Sacroiliac Joint DG:  Results for orders placed during the hospital encounter of 12/19/17  DG Si Joints   Narrative CLINICAL DATA:  History of scoliosis and chronic back pain.  EXAM: BILATERAL SACROILIAC JOINTS - 3+ VIEW  COMPARISON:  Lumbar spine radiographs-earlier same day  FINDINGS: No definite fracture or dislocation. Bilateral SI joint spaces appear preserved.  Limited visualization of the hips and pubic symphysis is normal.  Post multilevel intervertebral disc space replacement involving the lower lumbar spine, incompletely imaged.  Multiple phleboliths overlie the lower pelvis bilaterally.  IMPRESSION: No evidence of sacroiliitis.   Electronically Signed   By: Sandi Mariscal M.D.   On: 12/19/2017 16:11      Complexity Note: Imaging results reviewed. Results shared with Savannah Mcclure, using Layman's terms.                         Meds   Current Outpatient Medications:  .  B-COMPLEX-C PO, Take 1 tablet by mouth daily., Disp: , Rfl:  .  benazepril (LOTENSIN) 20 MG tablet, Take 1 tablet (20 mg total) by mouth daily., Disp: 90 tablet, Rfl: 3 .  cilostazol (PLETAL) 100 MG tablet, Take 1 tablet (100 mg total) by mouth 2 (two) times daily., Disp: 180 tablet, Rfl: 1 .  cilostazol (PLETAL) 100 MG tablet, Take 1 tablet (100 mg total) by mouth 2 (two) times daily., Disp: 30 tablet, Rfl: 3 .  diltiazem (CARDIZEM) 30 MG tablet, Take 1 tablet (30 mg total) by mouth 3 (three) times daily as needed (Tachycardia HR > 130.)., Disp: 60 tablet, Rfl: 0 .  gabapentin (NEURONTIN) 300 MG capsule, Take 1 capsule (300 mg total) by mouth 2 (two) times daily., Disp: 60 capsule, Rfl: 3 .  hydrOXYzine (ATARAX/VISTARIL) 25 MG tablet, Take 1 tablet (25 mg total) by mouth 3 (three)  times daily as needed., Disp: 90 tablet, Rfl: 3 .  ipratropium-albuterol (DUONEB) 0.5-2.5 (3) MG/3ML SOLN, Take 3 mLs by nebulization every 6 (six) hours as needed., Disp: 360 mL, Rfl: 6 .  lidocaine (LIDODERM) 5 %, Place 1 patch onto the skin daily. Remove & Discard patch within 12 hours or as directed by MD, Disp: 60 patch, Rfl: 6 .  loperamide (ANTI-DIARRHEAL) 2 MG tablet, Take 2 mg by mouth 4 (four) times daily as needed for diarrhea or loose stools., Disp: , Rfl:  .  Multiple Vitamins-Minerals (MULTIVITAMIN WITH MINERALS) tablet, Take 1 tablet by mouth daily., Disp: , Rfl:  .  [START ON 01/26/2018] oxyCODONE (OXY IR/ROXICODONE) 5 MG immediate release tablet, Take 1 tablet (5 mg total) by mouth 3 (three) times daily as needed for severe pain. For chronic pain To last for 30 days from fill date, Disp: 90 tablet, Rfl: 0 .  tiotropium (SPIRIVA HANDIHALER) 18 MCG inhalation capsule, Place 1 capsule (18 mcg total) into inhaler and inhale daily., Disp: 30 capsule, Rfl: 2 .  UNABLE TO FIND, Take 1 tablet by mouth 2 (two) times daily. Cardio FX, Disp: , Rfl:  .  UNABLE TO FIND, Take 1 tablet by mouth daily. Cinnergy, Disp: , Rfl:  .  UNABLE TO FIND, Take 2 tablets by mouth daily. Joint FX, Disp: , Rfl:   ROS  Constitutional: Denies any fever or chills Gastrointestinal: No reported hemesis, hematochezia, vomiting, or acute GI distress Musculoskeletal: Denies any acute onset joint swelling, redness, loss of ROM, or weakness Neurological: No reported episodes of acute onset apraxia, aphasia, dysarthria, agnosia, amnesia, paralysis, loss of coordination, or loss of consciousness  Allergies  Savannah Mcclure is allergic to other; amlodipine; azithromycin; doxycycline; erythromycin; hydrocodone; lactose intolerance (gi); minocycline; nsaids; relafen [nabumetone]; sulfa antibiotics; and tetracyclines & related.  [place]  Drug: Savannah Mcclure  reports that she does not use drugs. Alcohol:  reports that she  drinks alcohol. Tobacco:  reports that she has been smoking.  She has been smoking about 0.50 packs per  day. She has quit using smokeless tobacco. Medical:  has a past medical history of Aneurysm ([place]), Blind right eye, Cardiac arrest ([place]), Cataract, COPD (chronic obstructive pulmonary disease) ([place]), Hypertension, Scoliosis, Sepsis ([place]), and Spinal stenosis. Surgical: Savannah Mcclure  has a past surgical history that includes right eye surgery; Wisdom tooth extraction; Back surgery; Abdominal hysterectomy; and Breast lumpectomy (Left). Family: family history includes Heart disease in her paternal grandfather and paternal grandmother; Heart failure in her father; Hypertension in her mother; Intracerebral hemorrhage in her mother; Kidney disease in her maternal grandmother; Rheum arthritis in her maternal grandmother; Schizophrenia in her daughter; Stroke in her paternal grandfather and paternal grandmother.  Constitutional Exam  General appearance: Well nourished, well developed, and well hydrated. In no apparent acute distress Vitals:   01/23/18 1152  BP: 134/66  Pulse: (!) 128  Temp: 98 F (36.7 C)  TempSrc: Oral  SpO2: 97%  Weight: 99 lb (44.9 kg)  Height: 5' 4"  (1.626 m)   BMI Assessment: Estimated body mass index is 16.99 kg/m as calculated from the following:   Height as of this encounter: 5' 4"  (1.626 m).   Weight as of this encounter: 99 lb (44.9 kg).  BMI interpretation table: BMI level Category Range association with higher incidence of chronic pain  <18 kg/m2 Underweight   18.5-24.9 kg/m2 Ideal body weight   25-29.9 kg/m2 Overweight Increased incidence by 20%  30-34.9 kg/m2 Obese (Class I) Increased incidence by 68%  35-39.9 kg/m2 Severe obesity (Class II) Increased incidence by 136%  >40 kg/m2 Extreme obesity (Class III) Increased incidence by 254%   Patient's current BMI Ideal Body weight  Body mass index is 16.99 kg/m. Female patients must weigh at least 45.5 kg  to calculate ideal body weight   BMI Readings from Last 4 Encounters:  01/23/18 16.99 kg/m  01/04/18 18.52 kg/m  12/19/17 18.71 kg/m  12/12/17 18.71 kg/m   Wt Readings from Last 4 Encounters:  01/23/18 99 lb (44.9 kg)  01/04/18 98 lb (44.5 kg)  12/19/17 99 lb (44.9 kg)  12/12/17 99 lb (44.9 kg)  Psych/Mental status: Alert, oriented x 3 (person, place, & time)       Eyes: PERLA Respiratory: No evidence of acute respiratory distress  Cervical Spine Area Exam  Skin & Axial Inspection: No masses, redness, edema, swelling, or associated skin lesions Alignment: Symmetrical Functional ROM: Unrestricted ROM      Stability: No instability detected Muscle Tone/Strength: Functionally intact. No obvious neuro-muscular anomalies detected. Sensory (Neurological): Unimpaired Palpation: No palpable anomalies              Upper Extremity (UE) Exam    Side: Right upper extremity  Side: Left upper extremity  Skin & Extremity Inspection: Skin color, temperature, and hair growth are WNL. No peripheral edema or cyanosis. No masses, redness, swelling, asymmetry, or associated skin lesions. No contractures.  Skin & Extremity Inspection: Skin color, temperature, and hair growth are WNL. No peripheral edema or cyanosis. No masses, redness, swelling, asymmetry, or associated skin lesions. No contractures.  Functional ROM: Unrestricted ROM          Functional ROM: Unrestricted ROM          Muscle Tone/Strength: Functionally intact. No obvious neuro-muscular anomalies detected.  Muscle Tone/Strength: Functionally intact. No obvious neuro-muscular anomalies detected.  Sensory (Neurological): Unimpaired          Sensory (Neurological): Unimpaired          Palpation: No palpable anomalies  Palpation: No palpable anomalies              Provocative Test(s):  Phalen's test: deferred Tinel's test: deferred Apley's scratch test (touch opposite shoulder):  Action 1 (Across chest): deferred Action 2  (Overhead): deferred Action 3 (LB reach): deferred   Provocative Test(s):  Phalen's test: deferred Tinel's test: deferred Apley's scratch test (touch opposite shoulder):  Action 1 (Across chest): deferred Action 2 (Overhead): deferred Action 3 (LB reach): deferred    Thoracic Spine Area Exam  Skin & Axial Inspection: Significant thoracic kyphosis Alignment: Symmetrical Functional ROM: Decreased ROM Stability: No instability detected Muscle Tone/Strength: Functionally intact. No obvious neuro-muscular anomalies detected. Sensory (Neurological): Articular pain pattern Muscle strength & Tone: No palpable anomalies  Lumbar Spine Area Exam  Skin & Axial Inspection: Well healed scar from previous spine surgery detected, thoracolumbar scoliosis present Alignment: Scoliosis detected, severe Functional ROM: Decreased ROM       Stability: No instability detected Muscle Tone/Strength: Increased muscle tone over affected area Sensory (Neurological): Articular pain pattern Palpation: No palpable anomalies       Provocative Tests: Lumbar Hyperextension and rotation test: Positive bilaterally for facet joint pain. Lumbar Lateral bending test: Positive due to fusion restriction. Patrick's Maneuver: Positive for bilateral S-I arthralgia              Gait & Posture Assessment  Ambulation: Limited Gait: Antalgic Posture: Difficulty standing up straight, due to pain, scoliosis  Lower Extremity Exam    Side: Right lower extremity  Side: Left lower extremity  Stability: No instability observed          Stability: No instability observed          Skin & Extremity Inspection: Skin color, temperature, and hair growth are WNL. No peripheral edema or cyanosis. No masses, redness, swelling, asymmetry, or associated skin lesions. No contractures.  Skin & Extremity Inspection: Skin color, temperature, and hair growth are WNL. No peripheral edema or cyanosis. No masses, redness, swelling, asymmetry, or  associated skin lesions. No contractures.  Functional ROM: Unrestricted ROM                  Functional ROM: Unrestricted ROM                  Muscle Tone/Strength: Functionally intact. No obvious neuro-muscular anomalies detected.  Muscle Tone/Strength: Functionally intact. No obvious neuro-muscular anomalies detected.  Sensory (Neurological): Unimpaired  Sensory (Neurological): Unimpaired  Palpation: No palpable anomalies  Palpation: No palpable anomalies   Assessment & Plan  Primary Diagnosis & Pertinent Problem List: The primary encounter diagnosis was Spondylosis without myelopathy or radiculopathy, lumbar region. Diagnoses of Chronic pain syndrome, Thoracic spondylosis without myelopathy, Scoliosis (and kyphoscoliosis), idiopathic, Lumbar degenerative disc disease, Spinal stenosis of lumbar region with neurogenic claudication, Chronic, continuous use of opioids, and Pain of both hip joints were also pertinent to this visit.  Visit Diagnosis: 1. Spondylosis without myelopathy or radiculopathy, lumbar region   2. Chronic pain syndrome   3. Thoracic spondylosis without myelopathy   4. Scoliosis (and kyphoscoliosis), idiopathic   5. Lumbar degenerative disc disease   6. Spinal stenosis of lumbar region with neurogenic claudication   7. Chronic, continuous use of opioids   8. Pain of both hip joints    General Recommendations: The pain condition that the patient suffers from is best treated with a multidisciplinary approach that involves an increase in physical activity to prevent de-conditioning and worsening of the pain cycle, as well as psychological  counseling (formal and/or informal) to address the co-morbid psychological affects of pain. Treatment will often involve judicious use of pain medications and interventional procedures to decrease the pain, allowing the patient to participate in the physical activity that will ultimately produce long-lasting pain reductions. The goal of the  multidisciplinary approach is to return the patient to a higher level of overall function and to restore their ability to perform activities of daily living.  77 year old female with a history of chronic axial low back pain secondary to severe thoracolumbar scoliosis (history of lumbar spine surgery in 2013 at [facility]: Anterior lumbar interbody fusion from L3 to sacrum), lumbar degenerative disc disease, lumbar spinal stenosis with neurogenic claudication.  Patient also endorses bilateral hip and groin pain as well as left shoulder pain.  Of note patient's surgery in 2013 resulted in extended postoperative stay requiring a tracheostomy and feeding tube and prolonged rehab.  Patient states that she was in the hospital for approximately 2.5 months after her lumbar spine surgery.  Patient moved from this region to [place].  She was being seen by pain management provider there.  Patient has been receiving oxycodone 5 mg 3 times daily to 4 times daily as needed since 2005/2006.  Since the patient's initial visit with me, she has weaned off of her Klonopin which I commended her on.  She is finding benefit with gabapentin that I prescribed her at the last visit and has been titrated up to 300 mg twice daily.  She is denying any side effects including lower extremity swelling, memory issues, vivid dreams however is endorsing infrequent dizziness which may or may not be due to the gabapentin.  At this point I did feel that it is safe for the patient to continue gabapentin at its current dose since it is providing her with pain relief with minimal side effects.  We also reviewed the patient's lumbar and thoracic x-rays which reveal significant and severe scoliosis of the thoracolumbar spine with a caudal component convex to the left at 51 degrees.  Patient also has thoracic facet and lumbar facet pathology that is diffuse.  Patient has previously had thoracic facet medial branch nerve blocks in the past in [place] which she  found effective.  After reviewing her x-rays and her physical exam, the patient has pain that is consistent with thoracic and lumbar facet syndrome.  We discussed T12, L1, L2 thoracolumbar facet medial branch nerve blocks, diagnostic, under fluoroscopy with sedation.  Risks and benefits of the procedure were discussed.  Patient is not on any blood thinners.  Patient would like to proceed.  Plan: -Repeat UDS to ensure no additional Klonopin -Sign opioid contract -Prescription for oxycodone at his current dose of 5 mg 3 times daily as needed, quantity 90/month.  Prescription provided for 1 month -Continue gabapentin at his current dose of 200 mg twice daily.  Prescription sent to [facility] -T12, L1, L2 facet medial branch nerve blocks for thoracolumbar facet arthropathy and hypertrophy.  Can consider radiofrequency ablation at these levels if diagnostic facet blocks are helpful.   Plan of Care  Pharmacotherapy (Medications Ordered): Meds ordered this encounter  Medications  . gabapentin (NEURONTIN) 300 MG capsule    Sig: Take 1 capsule (300 mg total) by mouth 2 (two) times daily.    Dispense:  60 capsule    Refill:  3    Do not place this medication, or any other prescription from our practice, on "Automatic Refill". Patient may have prescription filled one day  early if pharmacy is closed on scheduled refill date.  Marland Kitchen oxyCODONE (OXY IR/ROXICODONE) 5 MG immediate release tablet    Sig: Take 1 tablet (5 mg total) by mouth 3 (three) times daily as needed for severe pain. For chronic pain To last for 30 days from fill date    Dispense:  90 tablet    Refill:  0   Lab-work, procedure(s), and/or referral(s): Orders Placed This Encounter  Procedures  . LUMBAR FACET(MEDIAL BRANCH NERVE BLOCK) MBNB  . ToxASSURE Select 13 (MW), Urine    Pharmacological management options:  Opioid Analgesics: We'll take over management today. See above orders Membrane stabilizer: We have discussed the  possibility of optimizing this mode of therapy, if tolerated Muscle relaxant: We have discussed the possibility of a trial NSAID: We have discussed the possibility of a trial Other analgesic(s): To be determined at a later time   Interventional management options: Planned, scheduled, and/or pending:    -T12, L1, L2 facet medial branch nerve blocks   Considering:   -Higher level thoracic facet blocks   Provider-requested follow-up: Return in about 8 days (around 01/31/2018) for Procedure. Time Note: Greater than 50% of the 25 minute(s) of face-to-face time spent with Savannah Mcclure, was spent in counseling/coordination of care regarding: Ms. Lennox Grumbles Richert's primary cause of pain, the results of her recent test(s), the significance of each one oth the test(s) anomalies and it's corresponding characteristic pain pattern(s), the treatment plan, treatment alternatives, the risks and possible complications of proposed treatment, medication side effects, going over the informed consent, the opioid analgesic risks and possible complications, the results, interpretation and significance of  her recent diagnostic interventional treatment(s), the appropriate use of her medications, realistic expectations, the goals of pain management (increased in functionality), the medication agreement and the patient's responsibilities when it comes to controlled substances.  Future Appointments  Date Time Provider Erick  01/30/2018 10:30 AM [name], DO [facility]  01/31/2018 11:00 AM Gillis Santa, MD [facility] None  11/23/2018 11:00 AM [facility]FP NURSE HEALTH ADVISOR [facility]    Primary Care Physician: [name], DO Location: [facility] Note by: Gillis Santa, M.D Date: 01/23/2018; Time: 3:01 PM  Patient Instructions  You have been given prescription here for Oxycodone. Prescription for Gabapentin sent to your pharmacy.  Schedule for Lumbar Facet with  sedation.   ____________________________________________________________________________________________  Preparing for Procedure with Sedation  Instructions: . Oral Intake: Do not eat or drink anything for at least 8 hours prior to your procedure. . Transportation: Public transportation is not allowed. Bring an adult driver. The driver must be physically present in our waiting room before any procedure can be started. Marland Kitchen Physical Assistance: Bring an adult physically capable of assisting you, in the event you need help. This adult should keep you company at home for at least 6 hours after the procedure. . Blood Pressure Medicine: Take your blood pressure medicine with a sip of water the morning of the procedure. . Blood thinners:  . Diabetics on insulin: Notify the staff so that you can be scheduled 1st case in the morning. If your diabetes requires high dose insulin, take only  of your normal insulin dose the morning of the procedure and notify the staff that you have done so. . Preventing infections: Shower with an antibacterial soap the morning of your procedure. . Build-up your immune system: Take 1000 mg of Vitamin C with every meal (3 times a day) the day prior  to your procedure. Marland Kitchen Antibiotics: Inform the staff if you have a condition or reason that requires you to take antibiotics before dental procedures. . Pregnancy: If you are pregnant, call and cancel the procedure. . Sickness: If you have a cold, fever, or any active infections, call and cancel the procedure. . Arrival: You must be in the facility at least 30 minutes prior to your scheduled procedure. . Children: Do not bring children with you. . Dress appropriately: Bring dark clothing that you would not mind if they get stained. . Valuables: Do not bring any jewelry or valuables.  Procedure appointments are reserved for interventional treatments only. Marland Kitchen No Prescription Refills. . No medication changes will be discussed during  procedure appointments. . No disability issues will be discussed.  Remember:  Regular Business hours are:  Monday to Thursday 8:00 AM to 4:00 PM  Provider's Schedule: Milinda Pointer, MD:  Procedure days: Tuesday and Thursday 7:30 AM to 4:00 PM  Gillis Santa, MD:  Procedure days: Monday and Wednesday 7:30 AM to 4:00 PM ____________________________________________________________________________________________  ____________________________________________________________________________________________  Preparing for Procedure with Sedation  Instructions: . Oral Intake: Do not eat or drink anything for at least 8 hours prior to your procedure. . Transportation: Public transportation is not allowed. Bring an adult driver. The driver must be physically present in our waiting room before any procedure can be started. Marland Kitchen Physical Assistance: Bring an adult physically capable of assisting you, in the event you need help. This adult should keep you company at home for at least 6 hours after the procedure. . Blood Pressure Medicine: Take your blood pressure medicine with a sip of water the morning of the procedure. . Blood thinners:  . Diabetics on insulin: Notify the staff so that you can be scheduled 1st case in the morning. If your diabetes requires high dose insulin, take only  of your normal insulin dose the morning of the procedure and notify the staff that you have done so. . Preventing infections: Shower with an antibacterial soap the morning of your procedure. . Build-up your immune system: Take 1000 mg of Vitamin C with every meal (3 times a day) the day prior to your procedure. Marland Kitchen Antibiotics: Inform the staff if you have a condition or reason that requires you to take antibiotics before dental procedures. . Pregnancy: If you are pregnant, call and cancel the procedure. . Sickness: If you have a cold, fever, or any active infections, call and cancel the procedure. . Arrival: You  must be in the facility at least 30 minutes prior to your scheduled procedure. . Children: Do not bring children with you. . Dress appropriately: Bring dark clothing that you would not mind if they get stained. . Valuables: Do not bring any jewelry or valuables.  Procedure appointments are reserved for interventional treatments only. Marland Kitchen No Prescription Refills. . No medication changes will be discussed during procedure appointments. . No disability issues will be discussed.  Remember:  Regular Business hours are:  Monday to Thursday 8:00 AM to 4:00 PM  Provider's Schedule: Milinda Pointer, MD:  Procedure days: Tuesday and Thursday 7:30 AM to 4:00 PM  Gillis Santa, MD:  Procedure days: Monday and Wednesday 7:30 AM to 4:00 PM ____________________________________________________________________________________________      Steps to Quit Smoking Smoking tobacco can be bad for your health. It can also affect almost every organ in your body. Smoking puts you and people around you at risk for many serious long-lasting (chronic) diseases. Quitting smoking is hard,  but it is one of the best things that you can do for your health. It is never too late to quit. What are the benefits of quitting smoking? When you quit smoking, you lower your risk for getting serious diseases and conditions. They can include:  Lung cancer or lung disease.  Heart disease.  Stroke.  Heart attack.  Not being able to have children (infertility).  Weak bones (osteoporosis) and broken bones (fractures).  If you have coughing, wheezing, and shortness of breath, those symptoms may get better when you quit. You may also get sick less often. If you are pregnant, quitting smoking can help to lower your chances of having a baby of low birth weight. What can I do to help me quit smoking? Talk with your doctor about what can help you quit smoking. Some things you can do (strategies) include:  Quitting smoking  totally, instead of slowly cutting back how much you smoke over a period of time.  Going to in-person counseling. You are more likely to quit if you go to many counseling sessions.  Using resources and support systems, such as: ? Database administrator with a Social worker. ? Phone quitlines. ? Careers information officer. ? Support groups or group counseling. ? Text messaging programs. ? Mobile phone apps or applications.  Taking medicines. Some of these medicines may have nicotine in them. If you are pregnant or breastfeeding, do not take any medicines to quit smoking unless your doctor says it is okay. Talk with your doctor about counseling or other things that can help you.  Talk with your doctor about using more than one strategy at the same time, such as taking medicines while you are also going to in-person counseling. This can help make quitting easier. What things can I do to make it easier to quit? Quitting smoking might feel very hard at first, but there is a lot that you can do to make it easier. Take these steps:  Talk to your family and friends. Ask them to support and encourage you.  Call phone quitlines, reach out to support groups, or work with a Social worker.  Ask people who smoke to not smoke around you.  Avoid places that make you want (trigger) to smoke, such as: ? Bars. ? Parties. ? Smoke-break areas at work.  Spend time with people who do not smoke.  Lower the stress in your life. Stress can make you want to smoke. Try these things to help your stress: ? Getting regular exercise. ? Deep-breathing exercises. ? Yoga. ? Meditating. ? Doing a body scan. To do this, close your eyes, focus on one area of your body at a time from head to toe, and notice which parts of your body are tense. Try to relax the muscles in those areas.  Download or buy apps on your mobile phone or tablet that can help you stick to your quit plan. There are many free apps, such as QuitGuide from the [employer]  Office manager for Disease Control and Prevention). You can find more support from smokefree.gov and other websites.  This information is not intended to replace advice given to you by your health care provider. Make sure you discuss any questions you have with your health care provider. Document Released: 05/28/2009 Document Revised: 03/29/2016 Document Reviewed: 12/16/2014 Elsevier Interactive Patient Education  2018 [employer]. Smoking Tobacco Information Smoking tobacco will very likely harm your health. Tobacco contains a poisonous (toxic), colorless chemical called nicotine. Nicotine affects the brain and makes tobacco  addictive. This change in your brain can make it hard to stop smoking. Tobacco also has other toxic chemicals that can hurt your body and raise your risk of many cancers. How can smoking tobacco affect me? Smoking tobacco can increase your chances of having serious health conditions, such as:  Cancer. Smoking is most commonly associated with lung cancer, but can lead to cancer in other parts of the body.  Chronic obstructive pulmonary disease (COPD). This is a long-term lung condition that makes it hard to breathe. It also gets worse over time.  High blood pressure (hypertension), heart disease, stroke, or heart attack.  Lung infections, such as pneumonia.  Cataracts. This is when the lenses in the eyes become clouded.  Digestive problems. This may include peptic ulcers, heartburn, and gastroesophageal reflux disease (GERD).  Oral health problems, such as gum disease and tooth loss.  Loss of taste and smell.  Smoking can affect your appearance by causing:  Wrinkles.  Yellow or stained teeth, fingers, and fingernails.  Smoking tobacco can also affect your social life.  Many workplaces, [employer], hotels, and public places are tobacco-free. This means that you may experience challenges in finding places to smoke when away from home.  The cost of a smoking habit  can be expensive. Expenses for someone who smokes come in two ways: ? You spend money on a regular basis to buy tobacco. ? Your health care costs in the long-term are higher if you smoke.  Tobacco smoke can also affect the health of those around you. Children of smokers have greater chances of: ? Sudden infant death syndrome (SIDS). ? Ear infections. ? Lung infections.  What lifestyle changes can be made?  Do not start smoking. Quit if you already do.  To quit smoking: ? Make a plan to quit smoking and commit yourself to it. Look for programs to help you and ask your health care provider for recommendations and ideas. ? Talk with your health care provider about using nicotine replacement medicines to help you quit. Medicine replacement medicines include gum, lozenges, patches, sprays, or pills. ? Do not replace cigarette smoking with electronic cigarettes, which are commonly called e-cigarettes. The safety of e-cigarettes is not known, and some may contain harmful chemicals. ? Avoid places, people, or situations that tempt you to smoke. ? If you try to quit but return to smoking, don't give up hope. It is very common for people to try a number of times before they fully succeed. When you feel ready again, give it another try.  Quitting smoking might affect the way you eat as well as your weight. Be prepared to monitor your eating habits. Get support in planning and following a healthy diet.  Ask your health care provider about having regular tests (screenings) to check for cancer. This may include blood tests, imaging tests, and other tests.  Exercise regularly. Consider taking walks, joining a gym, or doing yoga or exercise classes.  Develop skills to manage your stress. These skills include meditation. What are the benefits of quitting smoking? By quitting smoking, you may:  Lower your risk of getting cancer and other diseases caused by smoking.  Live longer.  Breathe  better.  Lower your blood pressure and heart rate.  Stop your addiction to tobacco.  Stop creating secondhand smoke that hurts other people.  Improve your sense of taste and smell.  Look better over time, due to having fewer wrinkles and less staining.  What can happen if changes are not  made? If you do not stop smoking, you may:  Get cancer and other diseases.  Develop COPD or other long-term (chronic) lung conditions.  Develop serious problems with your heart and blood vessels (cardiovascular system).  Need more tests to screen for problems caused by smoking.  Have higher, long-term healthcare costs from medicines or treatments related to smoking.  Continue to have worsening changes in your lungs, mouth, and nose.  Where to find support: To get support to quit smoking, consider:  Asking your health care provider for more information and resources.  Taking classes to learn more about quitting smoking.  Looking for local organizations that offer resources about quitting smoking.  Joining a support group for people who want to quit smoking in your local community.  Where to find more information: You may find more information about quitting smoking from:  HelpGuide.org: www.helpguide.org/articles/addictions/how-to-quit-smoking.htm  https://hall.com/: smokefree.gov  American Lung Association: www.lung.org  Contact a health care provider if:  You have problems breathing.  Your lips, nose, or fingers turn blue.  You have chest pain.  You are coughing up blood.  You feel faint or you pass out.  You have other noticeable changes that cause you to worry. Summary  Smoking tobacco can negatively affect your health, the health of those around you, your finances, and your social life.  Do not start smoking. Quit if you already do. If you need help quitting, ask your health care provider.  Think about joining a support group for people who want to quit smoking in  your local community. There are many effective programs that will help you to quit this behavior. This information is not intended to replace advice given to you by your health care provider. Make sure you discuss any questions you have with your health care provider. Document Released: 08/16/2016 Document Revised: 08/16/2016 Document Reviewed: 08/16/2016 Elsevier Interactive Patient Education  2018 [employer].  Facet Joint Block The facet joints connect the bones of the spine (vertebrae). They make it possible for you to bend, twist, and make other movements with your spine. They also keep you from bending too far, twisting too far, and making other excessive movements. A facet joint block is a procedure where a numbing medicine (anesthetic) is injected into a facet joint. Often, a type of anti-inflammatory medicine called a steroid is also injected. A facet joint block may be done to diagnose neck or back pain. If the pain gets better after a facet joint block, it means the pain is probably coming from the facet joint. If the pain does not get better, it means the pain is probably not coming from the facet joint. A facet joint block may also be done to relieve neck or back pain caused by an inflamed facet joint. A facet joint block is only done to relieve pain if the pain does not improve with other methods, such as medicine, exercise programs, and physical therapy. Tell a health care provider about:  Any allergies you have.  All medicines you are taking, including vitamins, herbs, eye drops, creams, and over-the-counter medicines.  Any problems you or family members have had with anesthetic medicines.  Any blood disorders you have.  Any surgeries you have had.  Any medical conditions you have.  Whether you are pregnant or may be pregnant. What are the risks? Generally, this is a safe procedure. However, problems may occur, including:  Bleeding.  Injury to a nerve near the injection  site.  Pain at the injection site.  Weakness or numbness in areas controlled by nerves near the injection site.  Infection.  Temporary fluid retention.  Allergic reactions to medicines or dyes.  Injury to other structures or organs near the injection site.  What happens before the procedure?  Follow instructions from your health care provider about eating or drinking restrictions.  Ask your health care provider about: ? Changing or stopping your regular medicines. This is especially important if you are taking diabetes medicines or blood thinners. ? Taking medicines such as aspirin and ibuprofen. These medicines can thin your blood. Do not take these medicines before your procedure if your health care provider instructs you not to.  Do not take any new dietary supplements or medicines without asking your health care provider first.  Plan to have someone take you home after the procedure. What happens during the procedure?  You may need to remove your clothing and dress in an open-back gown.  The procedure will be done while you are lying on an X-ray table. You will most likely be asked to lie on your stomach, but you may be asked to lie in a different position if an injection will be made in your neck.  Machines will be used to monitor your oxygen levels, heart rate, and blood pressure.  If an injection will be made in your neck, an IV tube will be inserted into one of your veins. Fluids and medicine will flow directly into your body through the IV tube.  The area over the facet joint where the injection will be made will be cleaned with soap. The surrounding skin will be covered with clean drapes.  A numbing medicine (local anesthetic) will be applied to your skin. Your skin may sting or burn for a moment.  A video X-ray machine (fluoroscopy) will be used to locate the joint. In some cases, a CT scan may be used.  A contrast dye may be injected into the facet joint area to  help locate the joint.  When the joint is located, an anesthetic will be injected into the joint through the needle.  Your health care provider will ask you whether you feel pain relief. If you do feel relief, a steroid may be injected to provide pain relief for a longer period of time. If you do not feel relief or feel only partial relief, additional injections of an anesthetic may be made in other facet joints.  The needle will be removed.  Your skin will be cleaned.  A bandage (dressing) will be applied over each injection site. The procedure may vary among health care providers and hospitals. What happens after the procedure?  You will be observed for 15-30 minutes before being allowed to go home. This information is not intended to replace advice given to you by your health care provider. Make sure you discuss any questions you have with your health care provider. Document Released: 12/21/2006 Document Revised: 09/02/2015 Document Reviewed: 04/27/2015 Elsevier Interactive Patient Education  [employer].

## 2018-01-23 NOTE — Patient Instructions (Addendum)
You have been given prescription here for Oxycodone. Prescription for Gabapentin sent to your pharmacy.  Schedule for Lumbar Facet with sedation.   ____________________________________________________________________________________________  Preparing for Procedure with Sedation  Instructions: . Oral Intake: Do not eat or drink anything for at least 8 hours prior to your procedure. . Transportation: Public transportation is not allowed. Bring an adult driver. The driver must be physically present in our waiting room before any procedure can be started. Marland Kitchen Physical Assistance: Bring an adult physically capable of assisting you, in the event you need help. This adult should keep you company at home for at least 6 hours after the procedure. . Blood Pressure Medicine: Take your blood pressure medicine with a sip of water the morning of the procedure. . Blood thinners:  . Diabetics on insulin: Notify the staff so that you can be scheduled 1st case in the morning. If your diabetes requires high dose insulin, take only  of your normal insulin dose the morning of the procedure and notify the staff that you have done so. . Preventing infections: Shower with an antibacterial soap the morning of your procedure. . Build-up your immune system: Take 1000 mg of Vitamin C with every meal (3 times a day) the day prior to your procedure. Marland Kitchen Antibiotics: Inform the staff if you have a condition or reason that requires you to take antibiotics before dental procedures. . Pregnancy: If you are pregnant, call and cancel the procedure. . Sickness: If you have a cold, fever, or any active infections, call and cancel the procedure. . Arrival: You must be in the facility at least 30 minutes prior to your scheduled procedure. . Children: Do not bring children with you. . Dress appropriately: Bring dark clothing that you would not mind if they get stained. . Valuables: Do not bring any jewelry or valuables.  Procedure  appointments are reserved for interventional treatments only. Marland Kitchen No Prescription Refills. . No medication changes will be discussed during procedure appointments. . No disability issues will be discussed.  Remember:  Regular Business hours are:  Monday to Thursday 8:00 AM to 4:00 PM  Provider's Schedule: Milinda Pointer, MD:  Procedure days: Tuesday and Thursday 7:30 AM to 4:00 PM  Gillis Santa, MD:  Procedure days: Monday and Wednesday 7:30 AM to 4:00 PM ____________________________________________________________________________________________  ____________________________________________________________________________________________  Preparing for Procedure with Sedation  Instructions: . Oral Intake: Do not eat or drink anything for at least 8 hours prior to your procedure. . Transportation: Public transportation is not allowed. Bring an adult driver. The driver must be physically present in our waiting room before any procedure can be started. Marland Kitchen Physical Assistance: Bring an adult physically capable of assisting you, in the event you need help. This adult should keep you company at home for at least 6 hours after the procedure. . Blood Pressure Medicine: Take your blood pressure medicine with a sip of water the morning of the procedure. . Blood thinners:  . Diabetics on insulin: Notify the staff so that you can be scheduled 1st case in the morning. If your diabetes requires high dose insulin, take only  of your normal insulin dose the morning of the procedure and notify the staff that you have done so. . Preventing infections: Shower with an antibacterial soap the morning of your procedure. . Build-up your immune system: Take 1000 mg of Vitamin C with every meal (3 times a day) the day prior to your procedure. Marland Kitchen Antibiotics: Inform the staff if you have a  condition or reason that requires you to take antibiotics before dental procedures. . Pregnancy: If you are pregnant, call  and cancel the procedure. . Sickness: If you have a cold, fever, or any active infections, call and cancel the procedure. . Arrival: You must be in the facility at least 30 minutes prior to your scheduled procedure. . Children: Do not bring children with you. . Dress appropriately: Bring dark clothing that you would not mind if they get stained. . Valuables: Do not bring any jewelry or valuables.  Procedure appointments are reserved for interventional treatments only. Marland Kitchen No Prescription Refills. . No medication changes will be discussed during procedure appointments. . No disability issues will be discussed.  Remember:  Regular Business hours are:  Monday to Thursday 8:00 AM to 4:00 PM  Provider's Schedule: Milinda Pointer, MD:  Procedure days: Tuesday and Thursday 7:30 AM to 4:00 PM  Gillis Santa, MD:  Procedure days: Monday and Wednesday 7:30 AM to 4:00 PM ____________________________________________________________________________________________      Steps to Quit Smoking Smoking tobacco can be bad for your health. It can also affect almost every organ in your body. Smoking puts you and people around you at risk for many serious long-lasting (chronic) diseases. Quitting smoking is hard, but it is one of the best things that you can do for your health. It is never too late to quit. What are the benefits of quitting smoking? When you quit smoking, you lower your risk for getting serious diseases and conditions. They can include:  Lung cancer or lung disease.  Heart disease.  Stroke.  Heart attack.  Not being able to have children (infertility).  Weak bones (osteoporosis) and broken bones (fractures).  If you have coughing, wheezing, and shortness of breath, those symptoms may get better when you quit. You may also get sick less often. If you are pregnant, quitting smoking can help to lower your chances of having a baby of low birth weight. What can I do to help me  quit smoking? Talk with your doctor about what can help you quit smoking. Some things you can do (strategies) include:  Quitting smoking totally, instead of slowly cutting back how much you smoke over a period of time.  Going to in-person counseling. You are more likely to quit if you go to many counseling sessions.  Using resources and support systems, such as: ? Database administrator with a Social worker. ? Phone quitlines. ? Careers information officer. ? Support groups or group counseling. ? Text messaging programs. ? Mobile phone apps or applications.  Taking medicines. Some of these medicines may have nicotine in them. If you are pregnant or breastfeeding, do not take any medicines to quit smoking unless your doctor says it is okay. Talk with your doctor about counseling or other things that can help you.  Talk with your doctor about using more than one strategy at the same time, such as taking medicines while you are also going to in-person counseling. This can help make quitting easier. What things can I do to make it easier to quit? Quitting smoking might feel very hard at first, but there is a lot that you can do to make it easier. Take these steps:  Talk to your family and friends. Ask them to support and encourage you.  Call phone quitlines, reach out to support groups, or work with a Social worker.  Ask people who smoke to not smoke around you.  Avoid places that make you want (trigger) to smoke, such  as: ? Bars. ? Parties. ? Smoke-break areas at work.  Spend time with people who do not smoke.  Lower the stress in your life. Stress can make you want to smoke. Try these things to help your stress: ? Getting regular exercise. ? Deep-breathing exercises. ? Yoga. ? Meditating. ? Doing a body scan. To do this, close your eyes, focus on one area of your body at a time from head to toe, and notice which parts of your body are tense. Try to relax the muscles in those areas.  Download or  buy apps on your mobile phone or tablet that can help you stick to your quit plan. There are many free apps, such as QuitGuide from the [employer] Office manager for Disease Control and Prevention). You can find more support from smokefree.gov and other websites.  This information is not intended to replace advice given to you by your health care provider. Make sure you discuss any questions you have with your health care provider. Document Released: 05/28/2009 Document Revised: 03/29/2016 Document Reviewed: 12/16/2014 Elsevier Interactive Patient Education  2018 [employer]. Smoking Tobacco Information Smoking tobacco will very likely harm your health. Tobacco contains a poisonous (toxic), colorless chemical called nicotine. Nicotine affects the brain and makes tobacco addictive. This change in your brain can make it hard to stop smoking. Tobacco also has other toxic chemicals that can hurt your body and raise your risk of many cancers. How can smoking tobacco affect me? Smoking tobacco can increase your chances of having serious health conditions, such as:  Cancer. Smoking is most commonly associated with lung cancer, but can lead to cancer in other parts of the body.  Chronic obstructive pulmonary disease (COPD). This is a long-term lung condition that makes it hard to breathe. It also gets worse over time.  High blood pressure (hypertension), heart disease, stroke, or heart attack.  Lung infections, such as pneumonia.  Cataracts. This is when the lenses in the eyes become clouded.  Digestive problems. This may include peptic ulcers, heartburn, and gastroesophageal reflux disease (GERD).  Oral health problems, such as gum disease and tooth loss.  Loss of taste and smell.  Smoking can affect your appearance by causing:  Wrinkles.  Yellow or stained teeth, fingers, and fingernails.  Smoking tobacco can also affect your social life.  Many workplaces, [employer], hotels, and public places are  tobacco-free. This means that you may experience challenges in finding places to smoke when away from home.  The cost of a smoking habit can be expensive. Expenses for someone who smokes come in two ways: ? You spend money on a regular basis to buy tobacco. ? Your health care costs in the long-term are higher if you smoke.  Tobacco smoke can also affect the health of those around you. Children of smokers have greater chances of: ? Sudden infant death syndrome (SIDS). ? Ear infections. ? Lung infections.  What lifestyle changes can be made?  Do not start smoking. Quit if you already do.  To quit smoking: ? Make a plan to quit smoking and commit yourself to it. Look for programs to help you and ask your health care provider for recommendations and ideas. ? Talk with your health care provider about using nicotine replacement medicines to help you quit. Medicine replacement medicines include gum, lozenges, patches, sprays, or pills. ? Do not replace cigarette smoking with electronic cigarettes, which are commonly called e-cigarettes. The safety of e-cigarettes is not known, and some may contain harmful chemicals. ?  Avoid places, people, or situations that tempt you to smoke. ? If you try to quit but return to smoking, don't give up hope. It is very common for people to try a number of times before they fully succeed. When you feel ready again, give it another try.  Quitting smoking might affect the way you eat as well as your weight. Be prepared to monitor your eating habits. Get support in planning and following a healthy diet.  Ask your health care provider about having regular tests (screenings) to check for cancer. This may include blood tests, imaging tests, and other tests.  Exercise regularly. Consider taking walks, joining a gym, or doing yoga or exercise classes.  Develop skills to manage your stress. These skills include meditation. What are the benefits of quitting smoking? By  quitting smoking, you may:  Lower your risk of getting cancer and other diseases caused by smoking.  Live longer.  Breathe better.  Lower your blood pressure and heart rate.  Stop your addiction to tobacco.  Stop creating secondhand smoke that hurts other people.  Improve your sense of taste and smell.  Look better over time, due to having fewer wrinkles and less staining.  What can happen if changes are not made? If you do not stop smoking, you may:  Get cancer and other diseases.  Develop COPD or other long-term (chronic) lung conditions.  Develop serious problems with your heart and blood vessels (cardiovascular system).  Need more tests to screen for problems caused by smoking.  Have higher, long-term healthcare costs from medicines or treatments related to smoking.  Continue to have worsening changes in your lungs, mouth, and nose.  Where to find support: To get support to quit smoking, consider:  Asking your health care provider for more information and resources.  Taking classes to learn more about quitting smoking.  Looking for local organizations that offer resources about quitting smoking.  Joining a support group for people who want to quit smoking in your local community.  Where to find more information: You may find more information about quitting smoking from:  HelpGuide.org: www.helpguide.org/articles/addictions/how-to-quit-smoking.htm  https://hall.com/: smokefree.gov  American Lung Association: www.lung.org  Contact a health care provider if:  You have problems breathing.  Your lips, nose, or fingers turn blue.  You have chest pain.  You are coughing up blood.  You feel faint or you pass out.  You have other noticeable changes that cause you to worry. Summary  Smoking tobacco can negatively affect your health, the health of those around you, your finances, and your social life.  Do not start smoking. Quit if you already do. If you  need help quitting, ask your health care provider.  Think about joining a support group for people who want to quit smoking in your local community. There are many effective programs that will help you to quit this behavior. This information is not intended to replace advice given to you by your health care provider. Make sure you discuss any questions you have with your health care provider. Document Released: 08/16/2016 Document Revised: 08/16/2016 Document Reviewed: 08/16/2016 Elsevier Interactive Patient Education  2018 [employer].  Facet Joint Block The facet joints connect the bones of the spine (vertebrae). They make it possible for you to bend, twist, and make other movements with your spine. They also keep you from bending too far, twisting too far, and making other excessive movements. A facet joint block is a procedure where a numbing medicine (anesthetic) is injected  into a facet joint. Often, a type of anti-inflammatory medicine called a steroid is also injected. A facet joint block may be done to diagnose neck or back pain. If the pain gets better after a facet joint block, it means the pain is probably coming from the facet joint. If the pain does not get better, it means the pain is probably not coming from the facet joint. A facet joint block may also be done to relieve neck or back pain caused by an inflamed facet joint. A facet joint block is only done to relieve pain if the pain does not improve with other methods, such as medicine, exercise programs, and physical therapy. Tell a health care provider about:  Any allergies you have.  All medicines you are taking, including vitamins, herbs, eye drops, creams, and over-the-counter medicines.  Any problems you or family members have had with anesthetic medicines.  Any blood disorders you have.  Any surgeries you have had.  Any medical conditions you have.  Whether you are pregnant or may be pregnant. What are the  risks? Generally, this is a safe procedure. However, problems may occur, including:  Bleeding.  Injury to a nerve near the injection site.  Pain at the injection site.  Weakness or numbness in areas controlled by nerves near the injection site.  Infection.  Temporary fluid retention.  Allergic reactions to medicines or dyes.  Injury to other structures or organs near the injection site.  What happens before the procedure?  Follow instructions from your health care provider about eating or drinking restrictions.  Ask your health care provider about: ? Changing or stopping your regular medicines. This is especially important if you are taking diabetes medicines or blood thinners. ? Taking medicines such as aspirin and ibuprofen. These medicines can thin your blood. Do not take these medicines before your procedure if your health care provider instructs you not to.  Do not take any new dietary supplements or medicines without asking your health care provider first.  Plan to have someone take you home after the procedure. What happens during the procedure?  You may need to remove your clothing and dress in an open-back gown.  The procedure will be done while you are lying on an X-ray table. You will most likely be asked to lie on your stomach, but you may be asked to lie in a different position if an injection will be made in your neck.  Machines will be used to monitor your oxygen levels, heart rate, and blood pressure.  If an injection will be made in your neck, an IV tube will be inserted into one of your veins. Fluids and medicine will flow directly into your body through the IV tube.  The area over the facet joint where the injection will be made will be cleaned with soap. The surrounding skin will be covered with clean drapes.  A numbing medicine (local anesthetic) will be applied to your skin. Your skin may sting or burn for a moment.  A video X-ray machine  (fluoroscopy) will be used to locate the joint. In some cases, a CT scan may be used.  A contrast dye may be injected into the facet joint area to help locate the joint.  When the joint is located, an anesthetic will be injected into the joint through the needle.  Your health care provider will ask you whether you feel pain relief. If you do feel relief, a steroid may be injected to provide pain  relief for a longer period of time. If you do not feel relief or feel only partial relief, additional injections of an anesthetic may be made in other facet joints.  The needle will be removed.  Your skin will be cleaned.  A bandage (dressing) will be applied over each injection site. The procedure may vary among health care providers and hospitals. What happens after the procedure?  You will be observed for 15-30 minutes before being allowed to go home. This information is not intended to replace advice given to you by your health care provider. Make sure you discuss any questions you have with your health care provider. Document Released: 12/21/2006 Document Revised: 09/02/2015 Document Reviewed: 04/27/2015 Elsevier Interactive Patient Education  [employer].

## 2018-01-28 LAB — TOXASSURE SELECT 13 (MW), URINE

## 2018-01-30 ENCOUNTER — Ambulatory Visit (INDEPENDENT_AMBULATORY_CARE_PROVIDER_SITE_OTHER): Payer: Medicare Other | Admitting: Family Medicine

## 2018-01-30 ENCOUNTER — Encounter: Payer: Self-pay | Admitting: Family Medicine

## 2018-01-30 VITALS — BP 137/95 | HR 131 | Temp 98.5°F | Wt 97.1 lb

## 2018-01-30 DIAGNOSIS — J441 Chronic obstructive pulmonary disease with (acute) exacerbation: Secondary | ICD-10-CM

## 2018-01-30 DIAGNOSIS — H1031 Unspecified acute conjunctivitis, right eye: Secondary | ICD-10-CM | POA: Diagnosis not present

## 2018-01-30 DIAGNOSIS — F419 Anxiety disorder, unspecified: Secondary | ICD-10-CM

## 2018-01-30 DIAGNOSIS — G894 Chronic pain syndrome: Secondary | ICD-10-CM | POA: Diagnosis not present

## 2018-01-30 MED ORDER — HYDROXYZINE HCL 25 MG PO TABS: 25.0000 mg | ORAL_TABLET | Freq: Three times a day (TID) | ORAL | 3 refills | Status: DC | PRN

## 2018-01-30 MED ORDER — ERYTHROMYCIN 5 MG/GM OP OINT: 1.0000 "application " | TOPICAL_OINTMENT | Freq: Three times a day (TID) | OPHTHALMIC | 0 refills | Status: DC

## 2018-01-30 MED ORDER — DILTIAZEM HCL 30 MG PO TABS: 30.0000 mg | ORAL_TABLET | Freq: Three times a day (TID) | ORAL | 1 refills | Status: DC | PRN

## 2018-01-30 MED ORDER — MONTELUKAST SODIUM 10 MG PO TABS: 10.0000 mg | ORAL_TABLET | Freq: Every day | ORAL | 3 refills | Status: DC

## 2018-01-30 MED ORDER — ALBUTEROL SULFATE (2.5 MG/3ML) 0.083% IN NEBU: 2.5000 mg | INHALATION_SOLUTION | Freq: Once | RESPIRATORY_TRACT | Status: DC

## 2018-01-30 MED ORDER — TIOTROPIUM BROMIDE MONOHYDRATE 18 MCG IN CAPS: 18.0000 ug | ORAL_CAPSULE | Freq: Every day | RESPIRATORY_TRACT | 3 refills | Status: DC

## 2018-01-30 NOTE — Progress Notes (Signed)
BP (!) 137/95 (BP Location: Left Arm, Patient Position: Sitting, Cuff Size: Small)   Pulse (!) 131   Temp 98.5 F (36.9 C)   Wt 97 lb 2 oz (44.1 kg)   SpO2 98%   BMI 16.67 kg/m    Subjective:    Patient ID: Savannah Mcclure, female    DOB: 08-22-1940, 77 y.o.   MRN: [id number]  HPI: Savannah Mcclure is a 77 y.o. female  Chief Complaint  Patient presents with  . Depression    Refill on hydroxyzine. Patient states that she cant take the cymbalta due to it causing her to be "zombified", she states that she took it for a couple of days  . URI    cough, nasal drainage  . COPD    refill on spiriva   ANXIETY/STRESS- did not take her cymbalta. She notes that it made her feel like a zombie, so she stopped it after a couple of days.Has been doing well on the hydroxyzine. Feels well on it.  Duration: chronic Anxious mood: yes  Excessive worrying: yes Irritability: no  Sweating: no Nausea: no Palpitations:no Hyperventilation: no Panic attacks: no Agoraphobia: no  Obscessions/compulsions: no Depressed mood: yes Depression screen [facility] 2/9 01/23/2018 01/04/2018 12/19/2017 11/20/2017 10/30/2017  Decreased Interest 2 1 0 3 1  Down, Depressed, Hopeless 0 1 1 1 1   PHQ - 2 Score 2 2 1 4 2   Altered sleeping 1 2 - 3 1  Tired, decreased energy 3 2 - 3 3  Change in appetite 2 2 - 0 2  Feeling bad or failure about yourself  0 0 - 0 0  Trouble concentrating 0 0 - 0 0  Moving slowly or fidgety/restless 0 1 - 0 0  Suicidal thoughts 0 0 - 0 0  PHQ-9 Score 8 9 - 10 8  Difficult doing work/chores Somewhat difficult Somewhat difficult - Somewhat difficult -   Anhedonia: no Weight changes: no Insomnia: yes hard to fall asleep  Hypersomnia: no Fatigue/loss of energy: yes Feelings of worthlessness: no Feelings of guilt: no Impaired concentration/indecisiveness: no Suicidal ideations: no  Crying spells: no Recent Stressors/Life Changes: yes   Relationship problems: no   Family  stress: yes     Financial stress: yes    Job stress: no    Recent death/loss: no  CHRONIC PAIN- Pain management has been taken over by pain management.   UPPER RESPIRATORY TRACT INFECTION Duration: about a week Worst symptom: cough Fever: no Cough: yes Shortness of breath: yes Wheezing: no Chest pain: no Chest tightness: yes Chest congestion: yes Nasal congestion: yes Runny nose: yes Post nasal drip: yes Sneezing: yes Sore throat: no Swollen glands: no Sinus pressure: no Headache: no Face pain: no Toothache: no Ear pain: no  Ear pressure: no  Eyes red/itching:no Eye drainage/crusting: yes  Vomiting: no Rash: no Fatigue: yes Sick contacts: no Strep contacts: no  Context: worse Recurrent sinusitis: no Relief with OTC cold/cough medications: no  Treatments attempted: anti-histamine and pseudoephedrine   Relevant past medical, surgical, family and social history reviewed and updated as indicated. Interim medical history since our last visit reviewed. Allergies and medications reviewed and updated.  Review of Systems  Constitutional: Negative.   HENT: Positive for congestion, postnasal drip and rhinorrhea. Negative for dental problem, drooling, ear discharge, ear pain, facial swelling, hearing loss, mouth sores, nosebleeds, sinus pressure, sinus pain, sneezing, sore throat, tinnitus, trouble swallowing and voice change.   Respiratory: Positive for cough, shortness  of breath and wheezing. Negative for apnea, choking, chest tightness and stridor.   Cardiovascular: Negative.   Neurological: Negative.   Psychiatric/Behavioral: Negative.     Per HPI unless specifically indicated above     Objective:    BP (!) 137/95 (BP Location: Left Arm, Patient Position: Sitting, Cuff Size: Small)   Pulse (!) 131   Temp 98.5 F (36.9 C)   Wt 97 lb 2 oz (44.1 kg)   SpO2 98%   BMI 16.67 kg/m   Wt Readings from Last 3 Encounters:  01/30/18 97 lb 2 oz (44.1 kg)  01/23/18 99 lb  (44.9 kg)  01/04/18 98 lb (44.5 kg)    Physical Exam  Constitutional: She is oriented to person, place, and time. She appears well-developed and well-nourished. No distress.  HENT:  Head: Normocephalic and atraumatic.  Right Ear: Hearing and external ear normal.  Left Ear: Hearing and external ear normal.  Nose: Nose normal.  Mouth/Throat: Oropharynx is clear and moist. No oropharyngeal exudate.  Eyes: Pupils are equal, round, and reactive to light. EOM and lids are normal. Right eye exhibits no discharge. Left eye exhibits no discharge. No scleral icterus.  Scarring and erythema on R eye  Neck: Normal range of motion. Neck supple. No JVD present. No tracheal deviation present. No thyromegaly present.  Cardiovascular: Normal rate, regular rhythm, normal heart sounds and intact distal pulses. Exam reveals no gallop and no friction rub.  No murmur heard. Pulmonary/Chest: Effort normal. No stridor. No respiratory distress. She has wheezes. She has no rales. She exhibits no tenderness.  Musculoskeletal: Normal range of motion.  Lymphadenopathy:    She has no cervical adenopathy.  Neurological: She is alert and oriented to person, place, and time.  Skin: Skin is intact. No rash noted. She is not diaphoretic.  Psychiatric: She has a normal mood and affect. Her speech is normal and behavior is normal. Judgment and thought content normal. Cognition and memory are normal.  Nursing note and vitals reviewed.   Results for orders placed or performed in visit on 01/23/18  ToxASSURE Select 13 (MW), Urine  Result Value Ref Range   Summary FINAL       Assessment & Plan:   Problem List Items Addressed This Visit      Other   Anxiety - Primary   Chronic pain syndrome    Pain management taken over by pain management. Continue to follow with them. Call with any concerns.           Follow up plan: No follow-ups on file.

## 2018-01-30 NOTE — Assessment & Plan Note (Signed)
Pain management taken over by pain management. Continue to follow with them. Call with any concerns.

## 2018-01-30 NOTE — Assessment & Plan Note (Signed)
Doing OK on current regimen. Continue current regimen. Call with any concerns.

## 2018-01-31 ENCOUNTER — Other Ambulatory Visit: Payer: Self-pay

## 2018-01-31 ENCOUNTER — Ambulatory Visit
Admission: RE | Admit: 2018-01-31 | Discharge: 2018-01-31 | Disposition: A | Discharge status: Home / Self Care | Payer: Medicare Other | Source: Ambulatory Visit | Attending: Student in an Organized Health Care Education/Training Program | Admitting: Student in an Organized Health Care Education/Training Program

## 2018-01-31 ENCOUNTER — Ambulatory Visit (HOSPITAL_BASED_OUTPATIENT_CLINIC_OR_DEPARTMENT_OTHER): Payer: Medicare Other | Admitting: Student in an Organized Health Care Education/Training Program

## 2018-01-31 ENCOUNTER — Encounter: Payer: Self-pay | Admitting: Student in an Organized Health Care Education/Training Program

## 2018-01-31 DIAGNOSIS — Z885 Allergy status to narcotic agent status: Secondary | ICD-10-CM | POA: Insufficient documentation

## 2018-01-31 DIAGNOSIS — Z79899 Other long term (current) drug therapy: Secondary | ICD-10-CM | POA: Insufficient documentation

## 2018-01-31 DIAGNOSIS — M47816 Spondylosis without myelopathy or radiculopathy, lumbar region: Secondary | ICD-10-CM

## 2018-01-31 DIAGNOSIS — Z886 Allergy status to analgesic agent status: Secondary | ICD-10-CM | POA: Insufficient documentation

## 2018-01-31 DIAGNOSIS — Z888 Allergy status to other drugs, medicaments and biological substances status: Secondary | ICD-10-CM | POA: Insufficient documentation

## 2018-01-31 DIAGNOSIS — Z882 Allergy status to sulfonamides status: Secondary | ICD-10-CM | POA: Diagnosis not present

## 2018-01-31 DIAGNOSIS — Z881 Allergy status to other antibiotic agents status: Secondary | ICD-10-CM | POA: Insufficient documentation

## 2018-01-31 MED ORDER — LIDOCAINE HCL (PF) 1 % IJ SOLN
INTRAMUSCULAR | Status: AC
  Filled 2018-01-31: qty 5

## 2018-01-31 MED ORDER — DEXAMETHASONE SODIUM PHOSPHATE 10 MG/ML IJ SOLN
10.0000 mg | Freq: Once | INTRAMUSCULAR | Status: AC
  Administered 2018-01-31: 10 mg
  Filled 2018-01-31: qty 1

## 2018-01-31 MED ORDER — LACTATED RINGERS IV SOLN: 1000.0000 mL | Freq: Once | INTRAVENOUS | Status: DC

## 2018-01-31 MED ORDER — LIDOCAINE HCL 1 % IJ SOLN
10.0000 mL | Freq: Once | INTRAMUSCULAR | Status: AC
  Administered 2018-01-31: 10 mL
  Filled 2018-01-31: qty 10

## 2018-01-31 MED ORDER — FENTANYL CITRATE (PF) 100 MCG/2ML IJ SOLN: 25.0000 ug | INTRAMUSCULAR | Status: DC | PRN

## 2018-01-31 MED ORDER — ROPIVACAINE HCL 2 MG/ML IJ SOLN
10.0000 mL | Freq: Once | INTRAMUSCULAR | Status: AC
  Administered 2018-01-31: 10 mL
  Filled 2018-01-31: qty 10

## 2018-01-31 NOTE — Progress Notes (Signed)
Patient's Name: Savannah Mcclure  MRN: [id number]  Referring Provider: Gillis Santa, MD  DOB: 08-07-1941  PCP: [name], DO  DOS: 01/31/2018  Note by: Gillis Santa, MD  Service setting: Ambulatory outpatient  Specialty: Interventional Pain Management  Patient type: Established  Location: [facility] (AMB) Pain Management Facility  Visit type: Interventional Procedure   Primary Reason for Visit: Interventional Pain Management Treatment. CC: Back Pain (mid and lower back)  Procedure:       Anesthesia, Analgesia, Anxiolysis:  Type: Lumbar Facet, Medial Branch Block(s) #1  Primary Purpose: Diagnostic Region: Posterolateral Lumbosacral Spine Level: T12, L1, L2  Medial Branch Level(s). Injecting these levels blocks the T12-L1 and L1-L2 lumbar facet joints. Laterality: Bilateral  Type: Local Anesthesia Indication(s): Analgesia         Route: Infiltration ([place]/IM) IV Access: Declined Sedation: None since NPO instructions were not followed  Local Anesthetic: Lidocaine 1%   Indications: 1. Spondylosis without myelopathy or radiculopathy, lumbar region    Pain Score: Pre-procedure: 2 /10 Post-procedure: 2 /10  Pre-op Assessment:  Savannah Mcclure is a 77 y.o. (year old), female patient, seen today for interventional treatment. She  has a past surgical history that includes right eye surgery; Wisdom tooth extraction; Back surgery; Abdominal hysterectomy; and Breast lumpectomy (Left). Savannah Mcclure has a current medication list which includes the following prescription(s): b complex-c, benazepril, cilostazol, diltiazem, erythromycin, gabapentin, hydroxyzine, ipratropium-albuterol, lidocaine, loperamide, montelukast, multivitamin with minerals, oxycodone, tiotropium, UNABLE TO FIND, UNABLE TO FIND, and UNABLE TO FIND, and the following Facility-Administered Medications: albuterol, fentanyl, and lactated ringers. Her primarily concern today is the Back Pain (mid and lower back)  Initial  Vital Signs:  Pulse/HCG Rate: 89ECG Heart Rate: (!) 124(Dr. Candita Borenstein aware and he discussed with patient) Temp: 97.9 F (36.6 C) Resp: 18 BP: (!) 154/95 SpO2: 100 %  BMI: Estimated body mass index is 17.18 kg/m as calculated from the following:   Height as of this encounter: 5\' 3"  (1.6 m).   Weight as of this encounter: 97 lb (44 kg).  Risk Assessment: Allergies: Reviewed. She is allergic to other; amlodipine; azithromycin; doxycycline; erythromycin; hydrocodone; lactose intolerance (gi); minocycline; nsaids; relafen [nabumetone]; sulfa antibiotics; and tetracyclines & related.  Allergy Precautions: None required Coagulopathies: Reviewed. None identified.  Blood-thinner therapy: None at this time Active Infection(s): Reviewed. None identified. Savannah Mcclure is afebrile  Site Confirmation: Savannah Mcclure was asked to confirm the procedure and laterality before marking the site Procedure checklist: Completed Consent: Before the procedure and under the influence of no sedative(s), amnesic(s), or anxiolytics, the patient was informed of the treatment options, risks and possible complications. To fulfill our ethical and legal obligations, as recommended by the American Medical Association's Code of Ethics, I have informed the patient of my clinical impression; the nature and purpose of the treatment or procedure; the risks, benefits, and possible complications of the intervention; the alternatives, including doing nothing; the risk(s) and benefit(s) of the alternative treatment(s) or procedure(s); and the risk(s) and benefit(s) of doing nothing. The patient was provided information about the general risks and possible complications associated with the procedure. These may include, but are not limited to: failure to achieve desired goals, infection, bleeding, organ or nerve damage, allergic reactions, paralysis, and death. In addition, the patient was informed of those risks and complications  associated to Spine-related procedures, such as failure to decrease pain; infection (i.e.: Meningitis, epidural or intraspinal abscess); bleeding (i.e.: epidural hematoma, subarachnoid hemorrhage, or any other type  of intraspinal or peri-dural bleeding); organ or nerve damage (i.e.: Any type of peripheral nerve, nerve root, or spinal cord injury) with subsequent damage to sensory, motor, and/or autonomic systems, resulting in permanent pain, numbness, and/or weakness of one or several areas of the body; allergic reactions; (i.e.: anaphylactic reaction); and/or death. Furthermore, the patient was informed of those risks and complications associated with the medications. These include, but are not limited to: allergic reactions (i.e.: anaphylactic or anaphylactoid reaction(s)); adrenal axis suppression; blood sugar elevation that in diabetics may result in ketoacidosis or comma; water retention that in patients with history of congestive heart failure may result in shortness of breath, pulmonary edema, and decompensation with resultant heart failure; weight gain; swelling or edema; medication-induced neural toxicity; particulate matter embolism and blood vessel occlusion with resultant organ, and/or nervous system infarction; and/or aseptic necrosis of one or more joints. Finally, the patient was informed that Medicine is not an exact science; therefore, there is also the possibility of unforeseen or unpredictable risks and/or possible complications that may result in a catastrophic outcome. The patient indicated having understood very clearly. We have given the patient no guarantees and we have made no promises. Enough time was given to the patient to ask questions, all of which were answered to the patient's satisfaction. Savannah Mcclure has indicated that she wanted to continue with the procedure. Attestation: I, the ordering provider, attest that I have discussed with the patient the benefits, risks,  side-effects, alternatives, likelihood of achieving goals, and potential problems during recovery for the procedure that I have provided informed consent. Date  Time: 01/31/2018  9:02 AM  Pre-Procedure Preparation:  Monitoring: As per clinic protocol. Respiration, ETCO2, SpO2, BP, heart rate and rhythm monitor placed and checked for adequate function Safety Precautions: Patient was assessed for positional comfort and pressure points before starting the procedure. Time-out: I initiated and conducted the "Time-out" before starting the procedure, as per protocol. The patient was asked to participate by confirming the accuracy of the "Time Out" information. Verification of the correct person, site, and procedure were performed and confirmed by me, the nursing staff, and the patient. "Time-out" conducted as per Joint Commission's Universal Protocol (UP.01.01.01). Time: 1026  Description of Procedure:       Position: Prone Laterality: Bilateral. The procedure was performed in identical fashion on both sides. Levels: T12, L1, L2 Medial Branch Level(s) Area Prepped: Posterior Lumbosacral Region Prepping solution: ChloraPrep (2% chlorhexidine gluconate and 70% isopropyl alcohol) Safety Precautions: Aspiration looking for blood return was conducted prior to all injections. At no point did we inject any substances, as a needle was being advanced. Before injecting, the patient was told to immediately notify me if she was experiencing any new onset of "ringing in the ears, or metallic taste in the mouth". No attempts were made at seeking any paresthesias. Safe injection practices and needle disposal techniques used. Medications properly checked for expiration dates. SDV (single dose vial) medications used. After the completion of the procedure, all disposable equipment used was discarded in the proper designated medical waste containers. Local Anesthesia: Protocol guidelines were followed. The patient was  positioned over the fluoroscopy table. The area was prepped in the usual manner. The time-out was completed. The target area was identified using fluoroscopy. A 12-in long, straight, sterile hemostat was used with fluoroscopic guidance to locate the targets for each level blocked. Once located, the skin was marked with an approved surgical skin marker. Once all sites were marked, the skin (epidermis, dermis,  and hypodermis), as well as deeper tissues (fat, connective tissue and muscle) were infiltrated with a small amount of a short-acting local anesthetic, loaded on a 10cc syringe with a 25G, 1.5-in  Needle. An appropriate amount of time was allowed for local anesthetics to take effect before proceeding to the next step. Local Anesthetic: Lidocaine 1.0% The unused portion of the local anesthetic was discarded in the proper designated containers. Technical explanation of process:   T12 Medial Branch Nerve Block (MBB): The target area for the T12 medial branch is at the junction of the postero-lateral aspect of the superior articular process and the superior, posterior, and medial edge of the transverse process of L1. Under fluoroscopic guidance, a Quincke needle was inserted until contact was made with os over the superior postero-lateral aspect of the pedicular shadow (target area). After negative aspiration for blood, 28mL of the nerve block solution was injected without difficulty or complication. The needle was removed intact.  L1 Medial Branch Nerve Block (MBB): The target area for the L1 medial branch is at the junction of the postero-lateral aspect of the superior articular process and the superior, posterior, and medial edge of the transverse process of L2. Under fluoroscopic guidance, a Quincke needle was inserted until contact was made with os over the superior postero-lateral aspect of the pedicular shadow (target area). After negative aspiration for blood, 1 mL of the nerve block solution was  injected without difficulty or complication. The needle was removed intact.  L2 Medial Branch Nerve Block (MBB): The target area for the L2 medial branch is at the junction of the postero-lateral aspect of the superior articular process and the superior, posterior, and medial edge of the transverse process of L3. Under fluoroscopic guidance, a Quincke needle was inserted until contact was made with os over the superior postero-lateral aspect of the pedicular shadow (target area). After negative aspiration for blood, 60mL of the nerve block solution was injected without difficulty or complication. The needle was removed intact.   Procedural Needles: 22-gauge, 3.5-inch, Quincke needles used for all levels. Nerve block solution: 10 cc solution made of 9 cc of 0.2% ropivacaine, 1 cc of Decadron 10 mg/cc.  1-1.5 cc injected at each level above bilaterally.  The unused portion of the solution was discarded in the proper designated containers.  Once the entire procedure was completed, the treated area was cleaned, making sure to leave some of the prepping solution back to take advantage of its long term bactericidal properties.   Illustration of the posterior view of the lumbar spine and the posterior neural structures. Laminae of L2 through S1 are labeled. DPRL5, dorsal primary ramus of L5; DPRS1, dorsal primary ramus of S1; DPR3, dorsal primary ramus of L3; FJ, facet (zygapophyseal) joint L3-L4; I, inferior articular process of L4; LB1, lateral branch of dorsal primary ramus of L1; IAB, inferior articular branches from L3 medial branch (supplies L4-L5 facet joint); IBP, intermediate branch plexus; MB3, medial branch of dorsal primary ramus of L3; NR3, third lumbar nerve root; S, superior articular process of L5; SAB, superior articular branches from L4 (supplies L4-5 facet joint also); TP3, transverse process of L3.  Vitals:   01/31/18 1026 01/31/18 1031 01/31/18 1035 01/31/18 1038  BP: (!) 167/82 (!)  149/92 (!) 145/85 (!) 146/85  Pulse:      Resp: 18 13 12 17   Temp:      SpO2: 99% 99% 98% 97%  Weight:      Height:  Start Time: 1027 hrs. End Time: 1038 hrs.  Imaging Guidance (Spinal):  Type of Imaging Technique: Fluoroscopy Guidance (Spinal) Indication(s): Assistance in needle guidance and placement for procedures requiring needle placement in or near specific anatomical locations not easily accessible without such assistance. Exposure Time: Please see nurses notes. Contrast: None used. Fluoroscopic Guidance: I was personally present during the use of fluoroscopy. "Tunnel Vision Technique" used to obtain the best possible view of the target area. Parallax error corrected before commencing the procedure. "Direction-depth-direction" technique used to introduce the needle under continuous pulsed fluoroscopy. Once target was reached, antero-posterior, oblique, and lateral fluoroscopic projection used confirm needle placement in all planes. Images permanently stored in EMR. Interpretation: No contrast injected. I personally interpreted the imaging intraoperatively. Adequate needle placement confirmed in multiple planes. Permanent images saved into the patient's record.  Antibiotic Prophylaxis:   Anti-infectives (From admission, onward)   None     Indication(s): None identified  Post-operative Assessment:  Post-procedure Vital Signs:  Pulse/HCG Rate: 89(!) 123 Temp: 97.9 F (36.6 C) Resp: 17 BP: (!) 146/85 SpO2: 97 %  EBL: None  Complications: No immediate post-treatment complications observed by team, or reported by patient.  Note: The patient tolerated the entire procedure well. A repeat set of vitals were taken after the procedure and the patient was kept under observation following institutional policy, for this type of procedure. Post-procedural neurological assessment was performed, showing return to baseline, prior to discharge. The patient was provided with  post-procedure discharge instructions, including a section on how to identify potential problems. Should any problems arise concerning this procedure, the patient was given instructions to immediately contact us, at any time, without hesitation. In any case, we plan to contact the patient by telephone for a follow-up status report regarding this interventional procedure.  Comments:  No additional relevant information. 5 out of 5 strength bilateral lower extremity: Plantar flexion, dorsiflexion, knee flexion, knee extension.  Plan of Care    Imaging Orders     DG C-Arm 1-60 Min-No Report Procedure Orders    No procedure(s) ordered today    Medications ordered for procedure: Meds ordered this encounter  Medications  . lactated ringers infusion 1,000 mL  . fentaNYL (SUBLIMAZE) injection 25-100 mcg    Make sure Narcan is available in the pyxis when using this medication. In the event of respiratory depression (RR< 8/min): Titrate NARCAN (naloxone) in increments of 0.1 to 0.2 mg IV at 2-3 minute intervals, until desired degree of reversal.  . lidocaine (XYLOCAINE) 1 % (with pres) injection 10 mL  . ropivacaine (PF) 2 mg/mL (0.2%) (NAROPIN) injection 10 mL  . dexamethasone (DECADRON) injection 10 mg   Medications administered: We administered lidocaine, ropivacaine (PF) 2 mg/mL (0.2%), and dexamethasone.  See the medical record for exact dosing, route, and time of administration.  New Prescriptions   No medications on file   Disposition: Discharge home  Discharge Date & Time: 01/31/2018; 1045(patient waiting for her ride until 12 noon) hrs.   Physician-requested Follow-up: Return in about 1 month (around 02/28/2018) for Post Procedure Evaluation.  Future Appointments  Date Time Provider [place]  03/01/2018 10:45 AM Gillis Santa, MD [facility] None  03/06/2018 10:30 AM [name], DO [facility]  11/23/2018 11:00 AM [facility] NURSE HEALTH ADVISOR [facility]   Primary Care  Physician: [name], DO Location: [facility] Note by: Gillis Santa, MD Date: 01/31/2018; Time: 11:59 AM  Disclaimer:  Medicine is not an exact science. The  only guarantee in medicine is that nothing is guaranteed. It is important to note that the decision to proceed with this intervention was based on the information collected from the patient. The Data and conclusions were drawn from the patient's questionnaire, the interview, and the physical examination. Because the information was provided in large part by the patient, it cannot be guaranteed that it has not been purposely or unconsciously manipulated. Every effort has been made to obtain as much relevant data as possible for this evaluation. It is important to note that the conclusions that lead to this procedure are derived in large part from the available data. Always take into account that the treatment will also be dependent on availability of resources and existing treatment guidelines, considered by other Pain Management Practitioners as being common knowledge and practice, at the time of the intervention. For Medico-Legal purposes, it is also important to point out that variation in procedural techniques and pharmacological choices are the acceptable norm. The indications, contraindications, technique, and results of the above procedure should only be interpreted and judged by a Board-Certified Interventional Pain Specialist with extensive familiarity and expertise in the same exact procedure and technique.

## 2018-01-31 NOTE — Patient Instructions (Signed)
Pain Management Discharge Instructions  General Discharge Instructions :  If you need to reach your doctor call: Monday-Friday 8:00 am - 4:00 pm at [phone] or toll free [phone].  After clinic hours [phone] to have operator reach doctor.  Bring all of your medication bottles to all your appointments in the pain clinic.  To cancel or reschedule your appointment with Pain Management please remember to call 24 hours in advance to avoid a fee.  Refer to the educational materials which you have been given on: General Risks, I had my Procedure. Discharge Instructions, Post Sedation.  Post Procedure Instructions:  The drugs you were given will stay in your system until tomorrow, so for the next 24 hours you should not drive, make any legal decisions or drink any alcoholic beverages.  You may eat anything you prefer, but it is better to start with liquids then soups and crackers, and gradually work up to solid foods.  Please notify your doctor immediately if you have any unusual bleeding, trouble breathing or pain that is not related to your normal pain.  Depending on the type of procedure that was done, some parts of your body may feel week and/or numb.  This usually clears up by tonight or the next day.  Walk with the use of an assistive device or accompanied by an adult for the 24 hours.  You may use ice on the affected area for the first 24 hours.  Put ice in a Ziploc bag and cover with a towel and place against area 15 minutes on 15 minutes off.  You may switch to heat after 24 hours.Facet Blocks Patient Information  Description: The facets are joints in the spine between the vertebrae.  Like any joints in the body, facets can become irritated and painful.  Arthritis can also effect the facets.  By injecting steroids and local anesthetic in and around these joints, we can temporarily block the nerve supply to them.  Steroids act directly on irritated nerves and tissues to  reduce selling and inflammation which often leads to decreased pain.  Facet blocks may be done anywhere along the spine from the neck to the low back depending upon the location of your pain.   After numbing the skin with local anesthetic (like Novocaine), a small needle is passed onto the facet joints under x-ray guidance.  You may experience a sensation of pressure while this is being done.  The entire block usually lasts about 15-25 minutes.   Conditions which may be treated by facet blocks:   Low back/buttock pain  Neck/shoulder pain  Certain types of headaches  Preparation for the injection:  1. Do not eat any solid food or dairy products within 8 hours of your appointment. 2. You may drink clear liquid up to 3 hours before appointment.  Clear liquids include water, black coffee, juice or soda.  No milk or cream please. 3. You may take your regular medication, including pain medications, with a sip of water before your appointment.  Diabetics should hold regular insulin (if taken separately) and take 1/2 normal NPH dose the morning of the procedure.  Carry some sugar containing items with you to your appointment. 4. A driver must accompany you and be prepared to drive you home after your procedure. 5. Bring all your current medications with you. 6. An IV may be inserted and sedation may be given at the discretion of the physician. 7. A blood pressure cuff, EKG and other monitors will often be   applied during the procedure.  Some patients may need to have extra oxygen administered for a short period. 8. You will be asked to provide medical information, including your allergies and medications, prior to the procedure.  We must know immediately if you are taking blood thinners (like Coumadin/Warfarin) or if you are allergic to IV iodine contrast (dye).  We must know if you could possible be pregnant.  Possible side-effects:   Bleeding from needle site  Infection (rare, may require  surgery)  Nerve injury (rare)  Numbness & tingling (temporary)  Difficulty urinating (rare, temporary)  Spinal headache (a headache worse with upright posture)  Light-headedness (temporary)  Pain at injection site (serveral days)  Decreased blood pressure (rare, temporary)  Weakness in arm/leg (temporary)  Pressure sensation in back/neck (temporary)   Call if you experience:   Fever/chills associated with headache or increased back/neck pain  Headache worsened by an upright position  New onset, weakness or numbness of an extremity below the injection site  Hives or difficulty breathing (go to the emergency room)  Inflammation or drainage at the injection site(s)  Severe back/neck pain greater than usual  New symptoms which are concerning to you  Please note:  Although the local anesthetic injected can often make your back or neck feel good for several hours after the injection, the pain will likely return. It takes 3-7 days for steroids to work.  You may not notice any pain relief for at least one week.  If effective, we will often do a series of 2-3 injections spaced 3-6 weeks apart to maximally decrease your pain.  After the initial series, you may be a candidate for a more permanent nerve block of the facets.  If you have any questions, please call [phone] [facility] 

## 2018-02-01 ENCOUNTER — Ambulatory Visit: Payer: Medicare Other | Admitting: Pain Medicine

## 2018-02-01 ENCOUNTER — Telehealth: Payer: Self-pay

## 2018-02-01 NOTE — Telephone Encounter (Signed)
Post procedure phone call.  Left message.  

## 2018-03-01 ENCOUNTER — Encounter: Payer: Self-pay | Admitting: Student in an Organized Health Care Education/Training Program

## 2018-03-01 ENCOUNTER — Other Ambulatory Visit: Payer: Self-pay

## 2018-03-01 ENCOUNTER — Ambulatory Visit
Payer: Medicare Other | Attending: Student in an Organized Health Care Education/Training Program | Admitting: Student in an Organized Health Care Education/Training Program

## 2018-03-01 VITALS — BP 169/90 | HR 99 | Temp 97.8°F | Resp 18 | Ht 61.0 in | Wt 98.0 lb

## 2018-03-01 DIAGNOSIS — F17210 Nicotine dependence, cigarettes, uncomplicated: Secondary | ICD-10-CM | POA: Diagnosis not present

## 2018-03-01 DIAGNOSIS — M419 Scoliosis, unspecified: Secondary | ICD-10-CM | POA: Insufficient documentation

## 2018-03-01 DIAGNOSIS — M25551 Pain in right hip: Secondary | ICD-10-CM | POA: Insufficient documentation

## 2018-03-01 DIAGNOSIS — M48062 Spinal stenosis, lumbar region with neurogenic claudication: Secondary | ICD-10-CM | POA: Diagnosis not present

## 2018-03-01 DIAGNOSIS — M5136 Other intervertebral disc degeneration, lumbar region: Secondary | ICD-10-CM | POA: Diagnosis not present

## 2018-03-01 DIAGNOSIS — Z888 Allergy status to other drugs, medicaments and biological substances status: Secondary | ICD-10-CM | POA: Insufficient documentation

## 2018-03-01 DIAGNOSIS — Z882 Allergy status to sulfonamides status: Secondary | ICD-10-CM | POA: Diagnosis not present

## 2018-03-01 DIAGNOSIS — D649 Anemia, unspecified: Secondary | ICD-10-CM | POA: Insufficient documentation

## 2018-03-01 DIAGNOSIS — M25552 Pain in left hip: Secondary | ICD-10-CM | POA: Diagnosis not present

## 2018-03-01 DIAGNOSIS — Z79891 Long term (current) use of opiate analgesic: Secondary | ICD-10-CM | POA: Insufficient documentation

## 2018-03-01 DIAGNOSIS — I739 Peripheral vascular disease, unspecified: Secondary | ICD-10-CM | POA: Insufficient documentation

## 2018-03-01 DIAGNOSIS — M47816 Spondylosis without myelopathy or radiculopathy, lumbar region: Secondary | ICD-10-CM

## 2018-03-01 DIAGNOSIS — Z881 Allergy status to other antibiotic agents status: Secondary | ICD-10-CM | POA: Diagnosis not present

## 2018-03-01 DIAGNOSIS — M25512 Pain in left shoulder: Secondary | ICD-10-CM | POA: Diagnosis not present

## 2018-03-01 DIAGNOSIS — F419 Anxiety disorder, unspecified: Secondary | ICD-10-CM | POA: Insufficient documentation

## 2018-03-01 DIAGNOSIS — G894 Chronic pain syndrome: Secondary | ICD-10-CM | POA: Diagnosis not present

## 2018-03-01 DIAGNOSIS — J449 Chronic obstructive pulmonary disease, unspecified: Secondary | ICD-10-CM | POA: Diagnosis not present

## 2018-03-01 DIAGNOSIS — Z886 Allergy status to analgesic agent status: Secondary | ICD-10-CM | POA: Diagnosis not present

## 2018-03-01 DIAGNOSIS — Z79899 Other long term (current) drug therapy: Secondary | ICD-10-CM | POA: Diagnosis not present

## 2018-03-01 DIAGNOSIS — Z8249 Family history of ischemic heart disease and other diseases of the circulatory system: Secondary | ICD-10-CM | POA: Insufficient documentation

## 2018-03-01 DIAGNOSIS — I10 Essential (primary) hypertension: Secondary | ICD-10-CM | POA: Insufficient documentation

## 2018-03-01 DIAGNOSIS — Z885 Allergy status to narcotic agent status: Secondary | ICD-10-CM | POA: Insufficient documentation

## 2018-03-01 MED ORDER — CYCLOBENZAPRINE HCL 5 MG PO TABS: 5.0000 mg | ORAL_TABLET | Freq: Two times a day (BID) | ORAL | 2 refills | Status: DC | PRN

## 2018-03-01 NOTE — Progress Notes (Signed)
Safety precautions to be maintained throughout the outpatient stay will include: orient to surroundings, keep bed in low position, maintain call bell within reach at all times, provide assistance with transfer out of bed and ambulation.  

## 2018-03-01 NOTE — Patient Instructions (Addendum)
____________________________________________________________________________________________  Preparing for your procedure (without sedation)  Instructions: . Oral Intake: Do not eat or drink anything for at least 3 hours prior to your procedure. . Transportation: Unless otherwise stated by your physician, you may drive yourself after the procedure. . Blood Pressure Medicine: Take your blood pressure medicine with a sip of water the morning of the procedure. . Blood thinners:  . Diabetics on insulin: Notify the staff so that you can be scheduled 1st case in the morning. If your diabetes requires high dose insulin, take only  of your normal insulin dose the morning of the procedure and notify the staff that you have done so. . Preventing infections: Shower with an antibacterial soap the morning of your procedure.  . Build-up your immune system: Take 1000 mg of Vitamin C with every meal (3 times a day) the day prior to your procedure. [place] Kitchen Antibiotics: Inform the staff if you have a condition or reason that requires you to take antibiotics before dental procedures. . Pregnancy: If you are pregnant, call and cancel the procedure. . Sickness: If you have a cold, fever, or any active infections, call and cancel the procedure. . Arrival: You must be in the facility at least 30 minutes prior to your scheduled procedure. . Children: Do not bring any children with you. . Dress appropriately: Bring dark clothing that you would not mind if they get stained. . Valuables: Do not bring any jewelry or valuables.  Procedure appointments are reserved for interventional treatments only. [place]land Kitchen No Prescription Refills. . No medication changes will be discussed during procedure appointments. . No disability issues will be discussed.  Remember:  Regular Business hours are:  Monday to Thursday 8:00 AM to 4:00 PM  Provider's Schedule: Milinda Pointer, MD:  Procedure days: Tuesday and Thursday 7:30 AM to 4:00  PM  Gillis Santa, MD:  Procedure days: Monday and Wednesday 7:30 AM to 4:00 PM ____________________________________________________________________________________________ A prescription for Flexeril was sent to your pharmacy.

## 2018-03-01 NOTE — Progress Notes (Signed)
Patient's Name: Savannah Mcclure  MRN: [id number]  Referring Provider: [name], DO  DOB: [birth year]  PCP: [name], DO  DOS: 03/01/2018  Note by: Gillis Santa, MD  Service setting: Ambulatory outpatient  Specialty: Interventional Pain Management  Location: [facility] (AMB) Pain Management Facility    Patient type: Established   Primary Reason(s) for Visit: Encounter for post-procedure evaluation of chronic illness with mild to moderate exacerbation CC: Follow-up (procedure evauluation )  HPI  Savannah Mcclure is a 77 y.o. year old, female patient, who comes today for a post-procedure evaluation. She has HTN (hypertension); COPD (chronic obstructive pulmonary disease) ([place]); History of tobacco abuse; Scoliosis (and kyphoscoliosis), idiopathic; Spinal stenosis of lumbar region with neurogenic claudication; PAD (peripheral artery disease) ([place]); Skin lesions; Vision loss; Tachycardia; Anxiety; Anemia; Hypokalemia; Abnormal weight loss; Controlled substance agreement signed; Lumbar degenerative disc disease; Chronic, continuous use of opioids; Chronic left shoulder pain; Pain of both hip joints; and Chronic pain syndrome on their problem list. Her primarily concern today is the Follow-up (procedure evauluation )  Pain Assessment: Location: Mid, Lower Back Radiating: Denies  Onset: More than a month ago Duration: Chronic pain Quality: Aching, Sharp, Discomfort, Constant Severity: 2 /10 (subjective, self-reported pain score)  Note: Reported level is compatible with observation.                         When using our objective Pain Scale, levels between 6 and 10/10 are said to belong in an emergency room, as it progressively worsens from a 6/10, described as severely limiting, requiring emergency care not usually available at an outpatient pain management facility. At a 6/10 level, communication becomes difficult and requires great effort. Assistance to reach the emergency department  may be required. Facial flushing and profuse sweating along with potentially dangerous increases in heart rate and blood pressure will be evident. Effect on ADL: "Limites walking activity and gardening" Timing: Constant Modifying factors: Musle relaxer; Sitting down and reclining in chair  BP: (!) 169/90  HR: 99  Savannah Mcclure comes in today for post-procedure evaluation after the treatment done on 02/01/2018.  Further details on both, my assessment(s), as well as the proposed treatment plan, please see below.  Post-Procedure Assessment  01/31/2018 Procedure: T12, L1, L2 Diagnostic medial branch nerve block Pre-procedure pain score:  2/10 Post-procedure pain score: 2/10         Influential Factors: BMI: 18.52 kg/m Intra-procedural challenges: None observed.         Assessment challenges: None detected.              Reported side-effects: None.        Post-procedural adverse reactions or complications: None reported         Sedation: Please see nurses note. When no sedatives are used, the analgesic levels obtained are directly associated to the effectiveness of the local anesthetics. However, when sedation is provided, the level of analgesia obtained during the initial 1 hour following the intervention, is believed to be the result of a combination of factors. These factors may include, but are not limited to: 1. The effectiveness of the local anesthetics used. 2. The effects of the analgesic(s) and/or anxiolytic(s) used. 3. The degree of discomfort experienced by the patient at the time of the procedure. 4. The patients ability and reliability in recalling and recording the events. 5. The presence and influence of possible secondary gains and/or psychosocial factors. Reported result: Relief experienced  during the 1st hour after the procedure: 100 % (Ultra-Short Term Relief)            Interpretative annotation: Clinically appropriate result. Analgesia during this period is likely to  be Local Anesthetic and/or IV Sedative (Analgesic/Anxiolytic) related.          Effects of local anesthetic: The analgesic effects attained during this period are directly associated to the localized infiltration of local anesthetics and therefore cary significant diagnostic value as to the etiological location, or anatomical origin, of the pain. Expected duration of relief is directly dependent on the pharmacodynamics of the local anesthetic used. Long-acting (4-6 hours) anesthetics used.  Reported result: Relief during the next 4 to 6 hour after the procedure: 100 % (Short-Term Relief)            Interpretative annotation: Clinically appropriate result. Analgesia during this period is likely to be Local Anesthetic-related.          Long-term benefit: Defined as the period of time past the expected duration of local anesthetics (1 hour for short-acting and 4-6 hours for long-acting). With the possible exception of prolonged sympathetic blockade from the local anesthetics, benefits during this period are typically attributed to, or associated with, other factors such as analgesic sensory neuropraxia, antiinflammatory effects, or beneficial biochemical changes provided by agents other than the local anesthetics.  Reported result: Extended relief following procedure: 25 % (Long-Term Relief)            Interpretative annotation: Clinically appropriate result. Good relief. No permanent benefit expected. Inflammation plays a part in the etiology to the pain.          Current benefits: Defined as reported results that persistent at this point in time.   Analgesia: 50 %            Function: Somewhat improved ROM: Somewhat improved Interpretative annotation: Recurrence of symptoms. No permanent benefit expected. Effective diagnostic intervention.          Interpretation: Results would suggest a successful diagnostic intervention. We'll proceed with diagnostic intervention #2, as soon as convenient           Plan:  Repeat treatment or therapy and compare extent and duration of benefits.                Laboratory Chemistry  Inflammation Markers (CRP: Acute Phase) (ESR: Chronic Phase) Lab Results  Component Value Date   LATICACIDVEN 1.8 10/09/2017                         Rheumatology Markers No results found for: RF, ANA, LABURIC, URICUR, LYMEIGGIGMAB, LYMEABIGMQN, HLAB27                      Renal Function Markers Lab Results  Component Value Date   BUN 17 10/31/2017   CREATININE 0.87 10/31/2017   BCR 16 10/30/2017   GFRAA >60 10/31/2017   GFRNONAA >60 10/31/2017                             Hepatic Function Markers Lab Results  Component Value Date   AST 13 10/30/2017   ALT 9 10/30/2017   ALBUMIN 4.4 10/30/2017   ALKPHOS 82 10/30/2017                        Electrolytes Lab Results  Component Value Date   NA  137 10/31/2017   K 3.4 (L) 10/31/2017   CL 101 10/31/2017   CALCIUM 8.9 10/31/2017   MG 1.8 10/13/2017   PHOS 3.2 10/13/2017                        Neuropathy Markers No results found for: VITAMINB12, FOLATE, HGBA1C, HIV                      Bone Pathology Markers No results found for: VD25OH, VD125OH2TOT, [id number], [id number], 25OHVITD1, 25OHVITD2, 25OHVITD3, TESTOFREE, TESTOSTERONE                       Coagulation Parameters Lab Results  Component Value Date   INR 1.07 10/07/2017   LABPROT 13.8 10/07/2017   PLT 412 10/31/2017                        Cardiovascular Markers Lab Results  Component Value Date   BNP 326 (H) 09/10/2012   CKTOTAL 65 09/10/2012   CKMB < 0.5 (L) 09/10/2012   TROPONINI <0.03 10/31/2017   HGB 11.1 (L) 10/31/2017   HCT 33.8 (L) 10/31/2017                         CA Markers No results found for: CEA, CA125, LABCA2                      Note: Lab results reviewed.  Recent Diagnostic Imaging Results  DG C-Arm 1-60 Min-No Report Fluoroscopy was utilized by the requesting physician.  No radiographic  interpretation.    Complexity Note: Imaging results reviewed. Results shared with Savannah Mcclure, using Layman's terms.                         Meds   Current Outpatient Medications:  .  B-COMPLEX-C PO, Take 1 tablet by mouth daily., Disp: , Rfl:  .  benazepril (LOTENSIN) 20 MG tablet, Take 1 tablet (20 mg total) by mouth daily., Disp: 90 tablet, Rfl: 3 .  cilostazol (PLETAL) 100 MG tablet, Take 1 tablet (100 mg total) by mouth 2 (two) times daily., Disp: 180 tablet, Rfl: 1 .  diltiazem (CARDIZEM) 30 MG tablet, Take 1 tablet (30 mg total) by mouth 3 (three) times daily as needed (Tachycardia HR > 130.)., Disp: 180 tablet, Rfl: 1 .  erythromycin (ROMYCIN) ophthalmic ointment, Place 1 application into the right eye 3 (three) times daily., Disp: 3.5 g, Rfl: 0 .  gabapentin (NEURONTIN) 300 MG capsule, Take 1 capsule (300 mg total) by mouth 2 (two) times daily., Disp: 60 capsule, Rfl: 3 .  hydrOXYzine (ATARAX/VISTARIL) 25 MG tablet, Take 1 tablet (25 mg total) by mouth 3 (three) times daily as needed., Disp: 270 tablet, Rfl: 3 .  ipratropium-albuterol (DUONEB) 0.5-2.5 (3) MG/3ML SOLN, Take 3 mLs by nebulization every 6 (six) hours as needed., Disp: 360 mL, Rfl: 6 .  lidocaine (LIDODERM) 5 %, Place 1 patch onto the skin daily. Remove & Discard patch within 12 hours or as directed by MD, Disp: 60 patch, Rfl: 6 .  loperamide (ANTI-DIARRHEAL) 2 MG tablet, Take 2 mg by mouth 4 (four) times daily as needed for diarrhea or loose stools., Disp: , Rfl:  .  montelukast (SINGULAIR) 10 MG tablet, Take 1 tablet (10 mg total) by mouth at bedtime., Disp: 30 tablet,  Rfl: 3 .  Multiple Vitamins-Minerals (MULTIVITAMIN WITH MINERALS) tablet, Take 1 tablet by mouth daily., Disp: , Rfl:  .  oxyCODONE (OXY IR/ROXICODONE) 5 MG immediate release tablet, Take 1 tablet (5 mg total) by mouth 3 (three) times daily as needed for severe pain. For chronic pain To last for 30 days from fill date, Disp: 90 tablet, Rfl: 0 .  tiotropium (SPIRIVA  HANDIHALER) 18 MCG inhalation capsule, Place 1 capsule (18 mcg total) into inhaler and inhale daily., Disp: 90 capsule, Rfl: 3 .  UNABLE TO FIND, Take 1 tablet by mouth 2 (two) times daily. Cardio FX, Disp: , Rfl:  .  UNABLE TO FIND, Take 1 tablet by mouth daily. Cinnergy, Disp: , Rfl:  .  UNABLE TO FIND, Take 2 tablets by mouth daily. Joint FX, Disp: , Rfl:  .  cyclobenzaprine (FLEXERIL) 5 MG tablet, Take 1 tablet (5 mg total) by mouth 2 (two) times daily as needed for muscle spasms., Disp: 60 tablet, Rfl: 2  Current Facility-Administered Medications:  .  albuterol (PROVENTIL) (2.5 MG/3ML) 0.083% nebulizer solution 2.5 mg, 2.5 mg, Nebulization, Once, Johnson, Megan P, DO  ROS  Constitutional: Denies any fever or chills Gastrointestinal: No reported hemesis, hematochezia, vomiting, or acute GI distress Musculoskeletal: Denies any acute onset joint swelling, redness, loss of ROM, or weakness Neurological: No reported episodes of acute onset apraxia, aphasia, dysarthria, agnosia, amnesia, paralysis, loss of coordination, or loss of consciousness  Allergies  Savannah Mcclure is allergic to other; amlodipine; azithromycin; doxycycline; erythromycin; hydrocodone; lactose intolerance (gi); minocycline; nsaids; relafen [nabumetone]; sulfa antibiotics; and tetracyclines & related.  [place]  Drug: Savannah Mcclure  reports that she does not use drugs. Alcohol:  reports that she drinks alcohol. Tobacco:  reports that she has been smoking.  She has been smoking about 0.50 packs per day. She has quit using smokeless tobacco. Medical:  has a past medical history of Aneurysm ([place]), Blind right eye, Cardiac arrest ([place]), Cataract, COPD (chronic obstructive pulmonary disease) ([place]), Hypertension, Scoliosis, Sepsis ([place]), and Spinal stenosis. Surgical: Savannah Mcclure  has a past surgical history that includes right eye surgery; Wisdom tooth extraction; Back surgery; Abdominal hysterectomy; and Breast  lumpectomy (Left). Family: family history includes Heart disease in her paternal grandfather and paternal grandmother; Heart failure in her father; Hypertension in her mother; Intracerebral hemorrhage in her mother; Kidney disease in her maternal grandmother; Rheum arthritis in her maternal grandmother; Schizophrenia in her daughter; Stroke in her paternal grandfather and paternal grandmother.  Constitutional Exam  General appearance: Well nourished, well developed, and well hydrated. In no apparent acute distress Vitals:   03/01/18 1102  BP: (!) 169/90  Pulse: 99  Resp: 18  Temp: 97.8 F (36.6 C)  SpO2: 100%  Weight: 98 lb (44.5 kg)  Height: '5\' 1"'  (1.549 m)   BMI Assessment: Estimated body mass index is 18.52 kg/m as calculated from the following:   Height as of this encounter: '5\' 1"'  (1.549 m).   Weight as of this encounter: 98 lb (44.5 kg).  BMI interpretation table: BMI level Category Range association with higher incidence of chronic pain  <18 kg/m2 Underweight   18.5-24.9 kg/m2 Ideal body weight   25-29.9 kg/m2 Overweight Increased incidence by 20%  30-34.9 kg/m2 Obese (Class I) Increased incidence by 68%  35-39.9 kg/m2 Severe obesity (Class II) Increased incidence by 136%  >40 kg/m2 Extreme obesity (Class III) Increased incidence by 254%   Patient's current BMI Ideal Body weight  Body mass index  is 18.52 kg/m. Female patients must weigh at least 45.5 kg to calculate ideal body weight   BMI Readings from Last 4 Encounters:  03/01/18 18.52 kg/m  01/31/18 17.18 kg/m  01/30/18 16.67 kg/m  01/23/18 16.99 kg/m   Wt Readings from Last 4 Encounters:  03/01/18 98 lb (44.5 kg)  01/31/18 97 lb (44 kg)  01/30/18 97 lb 2 oz (44.1 kg)  01/23/18 99 lb (44.9 kg)  Psych/Mental status: Alert, oriented x 3 (person, place, & time)       Eyes: PERLA Respiratory: No evidence of acute respiratory distress  Cervical Spine Area Exam  Skin & Axial Inspection: No masses, redness,  edema, swelling, or associated skin lesions Alignment: Symmetrical Functional ROM: Unrestricted ROM      Stability: No instability detected Muscle Tone/Strength: Functionally intact. No obvious neuro-muscular anomalies detected. Sensory (Neurological): Unimpaired Palpation: No palpable anomalies              Upper Extremity (UE) Exam    Side: Right upper extremity  Side: Left upper extremity  Skin & Extremity Inspection: Skin color, temperature, and hair growth are WNL. No peripheral edema or cyanosis. No masses, redness, swelling, asymmetry, or associated skin lesions. No contractures.  Skin & Extremity Inspection: Skin color, temperature, and hair growth are WNL. No peripheral edema or cyanosis. No masses, redness, swelling, asymmetry, or associated skin lesions. No contractures.  Functional ROM: Unrestricted ROM          Functional ROM: Unrestricted ROM          Muscle Tone/Strength: Functionally intact. No obvious neuro-muscular anomalies detected.  Muscle Tone/Strength: Functionally intact. No obvious neuro-muscular anomalies detected.  Sensory (Neurological): Unimpaired          Sensory (Neurological): Unimpaired          Palpation: No palpable anomalies              Palpation: No palpable anomalies              Provocative Test(s):  Phalen's test: deferred Tinel's test: deferred Apley's scratch test (touch opposite shoulder):  Action 1 (Across chest): deferred Action 2 (Overhead): deferred Action 3 (LB reach): deferred   Provocative Test(s):  Phalen's test: deferred Tinel's test: deferred Apley's scratch test (touch opposite shoulder):  Action 1 (Across chest): deferred Action 2 (Overhead): deferred Action 3 (LB reach): deferred    Thoracic Spine Area Exam  Skin & Axial Inspection:Significant thoracic kyphosis Alignment:Symmetrical Functional EVO:[id number] ROM Stability:No instability detected Muscle Tone/Strength:Functionally intact. No obvious neuro-muscular  anomalies detected. Sensory (Neurological):Articular pain pattern Muscle strength & Tone:No palpable anomalies  Lumbar Spine Area Exam  Skin & Axial Inspection:Well healed scar from previous spine surgery detected, thoracolumbar scoliosis present Alignment:Scoliosis detected, severe Functional EXH:[id number] ROM Stability:No instability detected Muscle Tone/Strength:Increased muscle tone over affected area Sensory (Neurological):Articular pain pattern Palpation:No palpable anomalies Provocative Tests: Lumbar Hyperextension and rotation test:Positivebilaterally for facet joint pain. Lumbar Lateral bending test:Positivedue to fusion restriction. Patrick's Maneuver:Positivefor bilateral S-I arthralgia  Gait & Posture Assessment  Ambulation:Limited Gait:Antalgic Posture:Difficulty standing up straight, due to pain, scoliosis  Lower Extremity Exam    Side: Right lower extremity  Side: Left lower extremity  Stability: No instability observed          Stability: No instability observed          Skin & Extremity Inspection: Skin color, temperature, and hair growth are WNL. No peripheral edema or cyanosis. No masses, redness, swelling, asymmetry, or associated skin lesions. No contractures.  Skin &  Extremity Inspection: Skin color, temperature, and hair growth are WNL. No peripheral edema or cyanosis. No masses, redness, swelling, asymmetry, or associated skin lesions. No contractures.  Functional ROM: Unrestricted ROM                  Functional ROM: Unrestricted ROM                  Muscle Tone/Strength: Functionally intact. No obvious neuro-muscular anomalies detected.  Muscle Tone/Strength: Functionally intact. No obvious neuro-muscular anomalies detected.  Sensory (Neurological): Unimpaired  Sensory (Neurological): Unimpaired  Palpation: No palpable anomalies  Palpation: No palpable anomalies   Assessment  Primary Diagnosis &  Pertinent Problem List: The primary encounter diagnosis was Spondylosis without myelopathy or radiculopathy, lumbar region. Diagnoses of Chronic pain syndrome, Lumbar degenerative disc disease, and Spinal stenosis of lumbar region with neurogenic claudication were also pertinent to this visit.  Status Diagnosis  Responding Responding Responding 1. Spondylosis without myelopathy or radiculopathy, lumbar region   2. Chronic pain syndrome   3. Lumbar degenerative disc disease   4. Spinal stenosis of lumbar region with neurogenic claudication      General Recommendations: The pain condition that the patient suffers from is best treated with a multidisciplinary approach that involves an increase in physical activity to prevent de-conditioning and worsening of the pain cycle, as well as psychological counseling (formal and/or informal) to address the co-morbid psychological affects of pain. Treatment will often involve judicious use of pain medications and interventional procedures to decrease the pain, allowing the patient to participate in the physical activity that will ultimately produce long-lasting pain reductions. The goal of the multidisciplinary approach is to return the patient to a higher level of overall function and to restore their ability to perform activities of daily living.  77 year old female with a history of chronic axial low back pain secondary to severe thoracolumbar scoliosis (history of lumbar spine surgery in 2013 at [facility]: Anterior lumbar interbody fusion from L3 to sacrum), lumbar degenerative disc disease, lumbar spinal stenosis with neurogenic claudication. Patient also endorses bilateral hip and groin pain as well as left shoulder pain. Of note patient's surgery in 2013 resulted in extended postoperative stay requiring a tracheostomy and feeding tube and prolonged rehab. Patient states that she was in the hospital for approximately 2.5 months after her lumbar spine surgery.  Patient moved from this region to [place]. She was being seen by pain management provider there. Patient has been receiving oxycodone 5 mg 3 times daily to 4 times daily as needed since 2005/2006.  Patient's lumbar and thoracic x-rays revealed significant and severe scoliosis of the thoracolumbar spine with a caudal component convex to the left at 51 degrees.  Patient also has thoracic facet and lumbar facet pathology that is diffuse.  Patient has previously had thoracic facet medial branch nerve blocks in the past in [place] which she found effective.  Patient follows up today status post diagnostic T12, L1, L2 bilateral facet medial branch nerve blocks.  Patient endorses greater than 50% benefit for approximately 1 week after her procedure.  This relief is still ongoing but has decreased.  Patient noted improvement in range of motion and lateral rotation.  We will plan on repeating diagnostic facet block #2 possibly followed by radiofrequency ablation.  We will also prescribe patient Flexeril for muscle spasms.  Patient has taken this medication in the past without any significant side effects.  Recommend 5 mg given risk of sedation associated with Flexeril.  Plan: -T12,  L1, L2 bilateral facet medial branch nerve block #2 under fluoroscopy without sedation. -Prescription for Flexeril as below  -Continue oxycodone 5 mill grams 3 times daily as needed.  Patient has prescription already and does not need a refill at the moment. -Continue gabapentin 200 mg daily.    Plan of Care  Pharmacotherapy (Medications Ordered): Meds ordered this encounter  Medications  . DISCONTD: cyclobenzaprine (FLEXERIL) 5 MG tablet    Sig: Take 1 tablet (5 mg total) by mouth 2 (two) times daily as needed for muscle spasms.    Dispense:  60 tablet    Refill:  2    Do not place this medication, or any other prescription from our practice, on "Automatic Refill". Patient may have prescription filled one day early if pharmacy  is closed on scheduled refill date.  . cyclobenzaprine (FLEXERIL) 5 MG tablet    Sig: Take 1 tablet (5 mg total) by mouth 2 (two) times daily as needed for muscle spasms.    Dispense:  60 tablet    Refill:  2    Do not place this medication, or any other prescription from our practice, on "Automatic Refill". Patient may have prescription filled one day early if pharmacy is closed on scheduled refill date.   Lab-work, procedure(s), and/or referral(s): Orders Placed This Encounter  Procedures  . LUMBAR FACET(MEDIAL BRANCH NERVE BLOCK) MBNB   Time Note: Greater than 50% of the 25 minute(s) of face-to-face time spent with Savannah Mcclure, was spent in counseling/coordination of care regarding: Ms. Lennox Grumbles Richert's primary cause of pain, the treatment plan, treatment alternatives, the risks and possible complications of proposed treatment, going over the informed consent, the results, interpretation and significance of  her recent diagnostic interventional treatment(s), realistic expectations and the goals of pain management (increased in functionality).  Provider-requested follow-up: Return in about 3 weeks (around 03/22/2018) for Procedure.  Future Appointments  Date Time Provider [place]  03/06/2018 10:30 AM [name], DO [facility]  03/19/2018  9:45 AM Gillis Santa, MD [facility] None  11/23/2018 11:00 AM [facility] NURSE HEALTH ADVISOR [facility]    Primary Care Physician: [name], DO Location: [facility] Outpatient [facility] Management Facility Note by: Gillis Santa, M.D Date: 03/01/2018; Time: 1:55 PM  Patient Instructions  ____________________________________________________________________________________________  Preparing for your procedure (without sedation)  Instructions: . Oral Intake: Do not eat or drink anything for at least 3 hours prior to your procedure. . Transportation: Unless otherwise stated by your physician, you may drive yourself after the  procedure. . Blood Pressure Medicine: Take your blood pressure medicine with a sip of water the morning of the procedure. . Blood thinners:  . Diabetics on insulin: Notify the staff so that you can be scheduled 1st case in the morning. If your diabetes requires high dose insulin, take only  of your normal insulin dose the morning of the procedure and notify the staff that you have done so. . Preventing infections: Shower with an antibacterial soap the morning of your procedure.  . Build-up your immune system: Take 1000 mg of Vitamin C with every meal (3 times a day) the day prior to your procedure. [place] Kitchen Antibiotics: Inform the staff if you have a condition or reason that requires you to take antibiotics before dental procedures. . Pregnancy: If you are pregnant, call and cancel the procedure. . Sickness: If you have a cold, fever, or any active infections, call and cancel the procedure. . Arrival: You must be in the facility  at least 30 minutes prior to your scheduled procedure. . Children: Do not bring any children with you. . Dress appropriately: Bring dark clothing that you would not mind if they get stained. . Valuables: Do not bring any jewelry or valuables.  Procedure appointments are reserved for interventional treatments only. Marland Kitchen No Prescription Refills. . No medication changes will be discussed during procedure appointments. . No disability issues will be discussed.  Remember:  Regular Business hours are:  Monday to Thursday 8:00 AM to 4:00 PM  Provider's Schedule: Milinda Pointer, MD:  Procedure days: Tuesday and Thursday 7:30 AM to 4:00 PM  Gillis Santa, MD:  Procedure days: Monday and Wednesday 7:30 AM to 4:00 PM ____________________________________________________________________________________________ A prescription for Flexeril was sent to your pharmacy.

## 2018-03-06 ENCOUNTER — Encounter: Payer: Self-pay | Admitting: Family Medicine

## 2018-03-06 ENCOUNTER — Ambulatory Visit (INDEPENDENT_AMBULATORY_CARE_PROVIDER_SITE_OTHER): Payer: Medicare Other | Admitting: Family Medicine

## 2018-03-06 VITALS — BP 121/76 | HR 102 | Temp 97.6°F | Wt 102.1 lb

## 2018-03-06 DIAGNOSIS — R21 Rash and other nonspecific skin eruption: Secondary | ICD-10-CM | POA: Diagnosis not present

## 2018-03-06 DIAGNOSIS — J410 Simple chronic bronchitis: Secondary | ICD-10-CM

## 2018-03-06 MED ORDER — MONTELUKAST SODIUM 10 MG PO TABS: 10.0000 mg | ORAL_TABLET | Freq: Every day | ORAL | 3 refills | Status: DC

## 2018-03-06 MED ORDER — TRIAMCINOLONE ACETONIDE 0.5 % EX OINT: 1.0000 "application " | TOPICAL_OINTMENT | Freq: Two times a day (BID) | CUTANEOUS | 0 refills | Status: DC

## 2018-03-06 NOTE — Assessment & Plan Note (Signed)
Doing much better on current regimen. Continue current regimen. Refills given. Call with any concerns.

## 2018-03-06 NOTE — Progress Notes (Signed)
BP 121/76 (BP Location: Left Arm, Patient Position: Sitting, Cuff Size: Small)   Pulse (!) 102   Temp 97.6 F (36.4 C)   Wt 102 lb 2 oz (46.3 kg)   SpO2 97%   BMI 19.30 kg/m    Subjective:    Patient ID: Savannah Mcclure, female    DOB: 04-Dec-1940, 77 y.o.   MRN: [id number]  HPI: Savannah Mcclure is a 77 y.o. female  Chief Complaint  Patient presents with  . COPD   COPD COPD status: better Satisfied with current treatment?: yes Oxygen use: no Dyspnea frequency: with increased activity Cough frequency: rarely Rescue inhaler frequency: rarely   Limitation of activity: no- limited by back, but not by breathing Productive cough: none Pneumovax: Up to Date Influenza: Up to Date  RASH Duration:  year  Location: L forearm  Itching: no Burning: no Redness: no Oozing: no Scaling: yes Blisters: no Painful: no Fevers: no Change in detergents/soaps/personal care products: no Recent illness: no Recent travel:no History of same: no Context: stable Alleviating factors: nothing Treatments attempted:lotion/moisturizer Shortness of breath: no  Throat/tongue swelling: no Myalgias/arthralgias: no   Relevant past medical, surgical, family and social history reviewed and updated as indicated. Interim medical history since our last visit reviewed. Allergies and medications reviewed and updated.  Review of Systems  Constitutional: Negative.   Respiratory: Negative.   Cardiovascular: Negative.   Musculoskeletal: Positive for back pain and myalgias. Negative for arthralgias, gait problem, joint swelling, neck pain and neck stiffness.  Skin: Positive for rash. Negative for color change, pallor and wound.  Neurological: Negative.   Psychiatric/Behavioral: Negative for agitation, behavioral problems, confusion, decreased concentration, dysphoric mood, hallucinations, self-injury, sleep disturbance and suicidal ideas. The patient is nervous/anxious. The patient is  not hyperactive.     Per HPI unless specifically indicated above     Objective:    BP 121/76 (BP Location: Left Arm, Patient Position: Sitting, Cuff Size: Small)   Pulse (!) 102   Temp 97.6 F (36.4 C)   Wt 102 lb 2 oz (46.3 kg)   SpO2 97%   BMI 19.30 kg/m   Wt Readings from Last 3 Encounters:  03/06/18 102 lb 2 oz (46.3 kg)  03/01/18 98 lb (44.5 kg)  01/31/18 97 lb (44 kg)    Physical Exam  Constitutional: She is oriented to person, place, and time. She appears well-developed and well-nourished. No distress.  HENT:  Head: Normocephalic and atraumatic.  Right Ear: Hearing normal.  Left Ear: Hearing normal.  Nose: Nose normal.  Eyes: Conjunctivae and lids are normal. Right eye exhibits no discharge. Left eye exhibits no discharge. No scleral icterus.  Cardiovascular: Normal rate, regular rhythm, normal heart sounds and intact distal pulses. Exam reveals no gallop and no friction rub.  No murmur heard. Pulmonary/Chest: Effort normal and breath sounds normal. No stridor. No respiratory distress. She has no wheezes. She has no rales. She exhibits no tenderness.  Musculoskeletal: Normal range of motion.  Neurological: She is alert and oriented to person, place, and time.  Skin: Skin is warm, dry and intact. Capillary refill takes less than 2 seconds. Rash noted. She is not diaphoretic. No erythema. No pallor.  numular raised red rash on the L forearm  Psychiatric: She has a normal mood and affect. Her speech is normal and behavior is normal. Judgment and thought content normal. Cognition and memory are normal.  Nursing note and vitals reviewed.   Results for orders placed or performed  in visit on 01/23/18  ToxASSURE Select 34 (MW), Urine  Result Value Ref Range   Summary FINAL       Assessment & Plan:   Problem List Items Addressed This Visit      Respiratory   COPD (chronic obstructive pulmonary disease) ([place]) - Primary    Doing much better on current regimen. Continue  current regimen. Refills given. Call with any concerns.       Relevant Medications   montelukast (SINGULAIR) 10 MG tablet    Other Visit Diagnoses    Rash       Will treat with triamcinalone. Call with any concerns. If not better in 1 month, will biopsy.       Follow up plan: Return in about 6 months (around 09/06/2018).

## 2018-03-08 DIAGNOSIS — H44521 Atrophy of globe, right eye: Secondary | ICD-10-CM | POA: Diagnosis not present

## 2018-03-19 ENCOUNTER — Ambulatory Visit (HOSPITAL_BASED_OUTPATIENT_CLINIC_OR_DEPARTMENT_OTHER): Payer: Medicare Other | Admitting: Student in an Organized Health Care Education/Training Program

## 2018-03-19 ENCOUNTER — Encounter: Payer: Self-pay | Admitting: Student in an Organized Health Care Education/Training Program

## 2018-03-19 ENCOUNTER — Other Ambulatory Visit: Payer: Self-pay

## 2018-03-19 ENCOUNTER — Ambulatory Visit
Admission: RE | Admit: 2018-03-19 | Discharge: 2018-03-19 | Disposition: A | Discharge status: Home / Self Care | Payer: Medicare Other | Source: Ambulatory Visit | Attending: Student in an Organized Health Care Education/Training Program | Admitting: Student in an Organized Health Care Education/Training Program

## 2018-03-19 VITALS — BP 164/76 | HR 124 | Temp 98.0°F | Resp 12 | Ht 61.0 in | Wt 102.0 lb

## 2018-03-19 DIAGNOSIS — Z882 Allergy status to sulfonamides status: Secondary | ICD-10-CM | POA: Insufficient documentation

## 2018-03-19 DIAGNOSIS — Z888 Allergy status to other drugs, medicaments and biological substances status: Secondary | ICD-10-CM | POA: Insufficient documentation

## 2018-03-19 DIAGNOSIS — M47816 Spondylosis without myelopathy or radiculopathy, lumbar region: Secondary | ICD-10-CM | POA: Insufficient documentation

## 2018-03-19 DIAGNOSIS — M545 Low back pain: Secondary | ICD-10-CM | POA: Insufficient documentation

## 2018-03-19 DIAGNOSIS — Z886 Allergy status to analgesic agent status: Secondary | ICD-10-CM | POA: Insufficient documentation

## 2018-03-19 DIAGNOSIS — Z885 Allergy status to narcotic agent status: Secondary | ICD-10-CM | POA: Insufficient documentation

## 2018-03-19 DIAGNOSIS — Z881 Allergy status to other antibiotic agents status: Secondary | ICD-10-CM | POA: Insufficient documentation

## 2018-03-19 DIAGNOSIS — Z79899 Other long term (current) drug therapy: Secondary | ICD-10-CM | POA: Insufficient documentation

## 2018-03-19 MED ORDER — LIDOCAINE HCL 2 % IJ SOLN
20.0000 mL | Freq: Once | INTRAMUSCULAR | Status: AC
  Administered 2018-03-19: 400 mg

## 2018-03-19 MED ORDER — LIDOCAINE HCL 2 % IJ SOLN
INTRAMUSCULAR | Status: AC
  Filled 2018-03-19: qty 20

## 2018-03-19 MED ORDER — ROPIVACAINE HCL 2 MG/ML IJ SOLN
INTRAMUSCULAR | Status: AC
  Filled 2018-03-19: qty 10

## 2018-03-19 MED ORDER — ROPIVACAINE HCL 2 MG/ML IJ SOLN
10.0000 mL | Freq: Once | INTRAMUSCULAR | Status: AC
  Administered 2018-03-19: 10 mL

## 2018-03-19 MED ORDER — DEXAMETHASONE SODIUM PHOSPHATE 10 MG/ML IJ SOLN
10.0000 mg | Freq: Once | INTRAMUSCULAR | Status: AC
  Administered 2018-03-19: 10 mg

## 2018-03-19 MED ORDER — DEXAMETHASONE SODIUM PHOSPHATE 10 MG/ML IJ SOLN
INTRAMUSCULAR | Status: AC
Start: 2018-03-19 — End: ?
  Filled 2018-03-19: qty 1

## 2018-03-19 NOTE — Progress Notes (Signed)
Patient's Name: Savannah Mcclure  MRN: [id number]  Referring Provider: [name], DO  DOB: [birth year]  PCP: [name], DO  DOS: 03/19/2018  Note by: Gillis Santa, MD  Service setting: Ambulatory outpatient  Specialty: Interventional Pain Management  Patient type: Established  Location: [facility] (AMB) Pain Management Facility  Visit type: Interventional Procedure   Primary Reason for Visit: Interventional Pain Management Treatment. CC: Back Pain (lower)  Procedure:       Anesthesia, Analgesia, Anxiolysis:  Type: Lumbar Facet, Medial Branch Block(s) #2  Primary Purpose: Diagnostic Region: Posterolateral Lumbosacral Spine Level: T12, L1, L2  Medial Branch Level(s). Injecting these levels blocks the T12-L1 and L1-L2 lumbar facet joints. Laterality: Bilateral  Type: Local Anesthesia Indication(s): Analgesia         Route: Infiltration (Boley/IM) IV Access: Declined Sedation: Declined  Local Anesthetic: Lidocaine 1%   Indications: 1. Spondylosis without myelopathy or radiculopathy, lumbar region    Pain Score: Pre-procedure: 3 /10 Post-procedure: 0-No pain/10  Pre-op Assessment:  [name] is a 77 y.o. (year old), female patient, seen today for interventional treatment. She  has a past surgical history that includes right eye surgery; Wisdom tooth extraction; Back surgery; Abdominal hysterectomy; and Breast lumpectomy (Left). [name] has a current medication list which includes the following prescription(s): b complex-c, benazepril, cilostazol, cyclobenzaprine, diltiazem, gabapentin, hydroxyzine, ipratropium-albuterol, lidocaine, loperamide, montelukast, multivitamin with minerals, oxycodone, tiotropium, triamcinolone ointment, UNABLE TO FIND, UNABLE TO FIND, and UNABLE TO FIND, and the following Facility-Administered Medications: albuterol. Her primarily concern today is the Back Pain (lower)  Initial Vital Signs:  Pulse/HCG Rate: (!) 119  Temp: 98 F (36.7  C) Resp: 16 BP: (!) 153/77 SpO2: 100 %  BMI: Estimated body mass index is 19.27 kg/m as calculated from the following:   Height as of this encounter: 5\' 1"  (1.549 m).   Weight as of this encounter: 102 lb (46.3 kg).  Risk Assessment: Allergies: Reviewed. She is allergic to other; amlodipine; azithromycin; doxycycline; erythromycin; hydrocodone; lactose intolerance (gi); minocycline; nsaids; relafen [nabumetone]; sulfa antibiotics; and tetracyclines & related.  Allergy Precautions: None required Coagulopathies: Reviewed. None identified.  Blood-thinner therapy: None at this time Active Infection(s): Reviewed. None identified. [name] is afebrile  Site Confirmation: [name] was asked to confirm the procedure and laterality before marking the site Procedure checklist: Completed Consent: Before the procedure and under the influence of no sedative(s), amnesic(s), or anxiolytics, the patient was informed of the treatment options, risks and possible complications. To fulfill our ethical and legal obligations, as recommended by the American Medical Association's Code of Ethics, I have informed the patient of my clinical impression; the nature and purpose of the treatment or procedure; the risks, benefits, and possible complications of the intervention; the alternatives, including doing nothing; the risk(s) and benefit(s) of the alternative treatment(s) or procedure(s); and the risk(s) and benefit(s) of doing nothing. The patient was provided information about the general risks and possible complications associated with the procedure. These may include, but are not limited to: failure to achieve desired goals, infection, bleeding, organ or nerve damage, allergic reactions, paralysis, and death. In addition, the patient was informed of those risks and complications associated to Spine-related procedures, such as failure to decrease pain; infection (i.e.: Meningitis, epidural or  intraspinal abscess); bleeding (i.e.: epidural hematoma, subarachnoid hemorrhage, or any other type of intraspinal or peri-dural bleeding); organ or nerve damage (i.e.: Any type of peripheral nerve, nerve root, or spinal cord injury) with  subsequent damage to sensory, motor, and/or autonomic systems, resulting in permanent pain, numbness, and/or weakness of one or several areas of the body; allergic reactions; (i.e.: anaphylactic reaction); and/or death. Furthermore, the patient was informed of those risks and complications associated with the medications. These include, but are not limited to: allergic reactions (i.e.: anaphylactic or anaphylactoid reaction(s)); adrenal axis suppression; blood sugar elevation that in diabetics may result in ketoacidosis or comma; water retention that in patients with history of congestive heart failure may result in shortness of breath, pulmonary edema, and decompensation with resultant heart failure; weight gain; swelling or edema; medication-induced neural toxicity; particulate matter embolism and blood vessel occlusion with resultant organ, and/or nervous system infarction; and/or aseptic necrosis of one or more joints. Finally, the patient was informed that Medicine is not an exact science; therefore, there is also the possibility of unforeseen or unpredictable risks and/or possible complications that may result in a catastrophic outcome. The patient indicated having understood very clearly. We have given the patient no guarantees and we have made no promises. Enough time was given to the patient to ask questions, all of which were answered to the patient's satisfaction. [name] has indicated that she wanted to continue with the procedure. Attestation: I, the ordering provider, attest that I have discussed with the patient the benefits, risks, side-effects, alternatives, likelihood of achieving goals, and potential problems during recovery for the procedure that I  have provided informed consent. Date  Time: 03/19/2018  8:50 AM  Pre-Procedure Preparation:  Monitoring: As per clinic protocol. Respiration, ETCO2, SpO2, BP, heart rate and rhythm monitor placed and checked for adequate function Safety Precautions: Patient was assessed for positional comfort and pressure points before starting the procedure. Time-out: I initiated and conducted the "Time-out" before starting the procedure, as per protocol. The patient was asked to participate by confirming the accuracy of the "Time Out" information. Verification of the correct person, site, and procedure were performed and confirmed by me, the nursing staff, and the patient. "Time-out" conducted as per Joint Commission's Universal Protocol (UP.01.01.01). Time: 1001  Description of Procedure:       Position: Prone Laterality: Bilateral. The procedure was performed in identical fashion on both sides. Levels: T12, L1, L2 Medial Branch Level(s) Area Prepped: Posterior Lumbosacral Region Prepping solution: ChloraPrep (2% chlorhexidine gluconate and 70% isopropyl alcohol) Safety Precautions: Aspiration looking for blood return was conducted prior to all injections. At no point did we inject any substances, as a needle was being advanced. Before injecting, the patient was told to immediately notify me if she was experiencing any new onset of "ringing in the ears, or metallic taste in the mouth". No attempts were made at seeking any paresthesias. Safe injection practices and needle disposal techniques used. Medications properly checked for expiration dates. SDV (single dose vial) medications used. After the completion of the procedure, all disposable equipment used was discarded in the proper designated medical waste containers. Local Anesthesia: Protocol guidelines were followed. The patient was positioned over the fluoroscopy table. The area was prepped in the usual manner. The time-out was completed. The target area was  identified using fluoroscopy. A 12-in long, straight, sterile hemostat was used with fluoroscopic guidance to locate the targets for each level blocked. Once located, the skin was marked with an approved surgical skin marker. Once all sites were marked, the skin (epidermis, dermis, and hypodermis), as well as deeper tissues (fat, connective tissue and muscle) were infiltrated with a small amount of a short-acting local  anesthetic, loaded on a 10cc syringe with a 25G, 1.5-in  Needle. An appropriate amount of time was allowed for local anesthetics to take effect before proceeding to the next step. Local Anesthetic: Lidocaine 2.0% The unused portion of the local anesthetic was discarded in the proper designated containers. Technical explanation of process:   T12 Medial Branch Nerve Block (MBB): The target area for the T12 medial branch is at the junction of the postero-lateral aspect of the superior articular process and the superior, posterior, and medial edge of the transverse process of L1. Under fluoroscopic guidance, a Quincke needle was inserted until contact was made with os over the superior postero-lateral aspect of the pedicular shadow (target area). After negative aspiration for blood, 51mL of the nerve block solution was injected without difficulty or complication. The needle was removed intact.  L1 Medial Branch Nerve Block (MBB): The target area for the L1 medial branch is at the junction of the postero-lateral aspect of the superior articular process and the superior, posterior, and medial edge of the transverse process of L2. Under fluoroscopic guidance, a Quincke needle was inserted until contact was made with os over the superior postero-lateral aspect of the pedicular shadow (target area). After negative aspiration for blood, 1 mL of the nerve block solution was injected without difficulty or complication. The needle was removed intact.  L2 Medial Branch Nerve Block (MBB): The target area for  the L2 medial branch is at the junction of the postero-lateral aspect of the superior articular process and the superior, posterior, and medial edge of the transverse process of L3. Under fluoroscopic guidance, a Quincke needle was inserted until contact was made with os over the superior postero-lateral aspect of the pedicular shadow (target area). After negative aspiration for blood, 38mL of the nerve block solution was injected without difficulty or complication. The needle was removed intact.   Procedural Needles: 22-gauge, 3.5-inch, Quincke needles used for all levels. Nerve block solution: 10 cc solution made of 9 cc of 0.2% ropivacaine, 1 cc of Decadron 10 mg/cc.  1-1.5 cc injected at each level above bilaterally.  The unused portion of the solution was discarded in the proper designated containers.  Once the entire procedure was completed, the treated area was cleaned, making sure to leave some of the prepping solution back to take advantage of its long term bactericidal properties.   Illustration of the posterior view of the lumbar spine and the posterior neural structures. Laminae of L2 through S1 are labeled. DPRL5, dorsal primary ramus of L5; DPRS1, dorsal primary ramus of S1; DPR3, dorsal primary ramus of L3; FJ, facet (zygapophyseal) joint L3-L4; I, inferior articular process of L4; LB1, lateral branch of dorsal primary ramus of L1; IAB, inferior articular branches from L3 medial branch (supplies L4-L5 facet joint); IBP, intermediate branch plexus; MB3, medial branch of dorsal primary ramus of L3; NR3, third lumbar nerve root; S, superior articular process of L5; SAB, superior articular branches from L4 (supplies L4-5 facet joint also); TP3, transverse process of L3.  Vitals:   03/19/18 0950 03/19/18 1000 03/19/18 1005 03/19/18 1015  BP: (!) 164/78 (!) 168/85 (!) 162/77 (!) 164/76  Pulse: (!) 110 (!) 123 (!) 120 (!) 124  Resp: 15 12 11 12   Temp:      TempSrc:      SpO2: 100% 100% 100%  100%  Weight:      Height:        Start Time: 1001 hrs. End Time: 1010 hrs.  Imaging  Guidance (Spinal):  Type of Imaging Technique: Fluoroscopy Guidance (Spinal) Indication(s): Assistance in needle guidance and placement for procedures requiring needle placement in or near specific anatomical locations not easily accessible without such assistance. Exposure Time: Please see nurses notes. Contrast: None used. Fluoroscopic Guidance: I was personally present during the use of fluoroscopy. "Tunnel Vision Technique" used to obtain the best possible view of the target area. Parallax error corrected before commencing the procedure. "Direction-depth-direction" technique used to introduce the needle under continuous pulsed fluoroscopy. Once target was reached, antero-posterior, oblique, and lateral fluoroscopic projection used confirm needle placement in all planes. Images permanently stored in EMR. Interpretation: No contrast injected. I personally interpreted the imaging intraoperatively. Adequate needle placement confirmed in multiple planes. Permanent images saved into the patient's record.  Antibiotic Prophylaxis:   Anti-infectives (From admission, onward)   None     Indication(s): None identified  Post-operative Assessment:  Post-procedure Vital Signs:  Pulse/HCG Rate: (!) 124  Temp: 98 F (36.7 C) Resp: 12 BP: (!) 164/76 SpO2: 100 %  EBL: None  Complications: No immediate post-treatment complications observed by team, or reported by patient.  Note: The patient tolerated the entire procedure well. A repeat set of vitals were taken after the procedure and the patient was kept under observation following institutional policy, for this type of procedure. Post-procedural neurological assessment was performed, showing return to baseline, prior to discharge. The patient was provided with post-procedure discharge instructions, including a section on how to identify potential problems.  Should any problems arise concerning this procedure, the patient was given instructions to immediately contact us, at any time, without hesitation. In any case, we plan to contact the patient by telephone for a follow-up status report regarding this interventional procedure.  Comments:  No additional relevant information. 5 out of 5 strength bilateral lower extremity: Plantar flexion, dorsiflexion, knee flexion, knee extension.  Plan of Care    Imaging Orders     DG C-Arm 1-60 Min-No Report Procedure Orders    No procedure(s) ordered today    Medications ordered for procedure: Meds ordered this encounter  Medications  . lidocaine (XYLOCAINE) 2 % (with pres) injection 400 mg  . ropivacaine (PF) 2 mg/mL (0.2%) (NAROPIN) injection 10 mL  . dexamethasone (DECADRON) injection 10 mg   Medications administered: We administered lidocaine, ropivacaine (PF) 2 mg/mL (0.2%), and dexamethasone.  See the medical record for exact dosing, route, and time of administration.  New Prescriptions   No medications on file   Disposition: Discharge home  Discharge Date & Time: 03/19/2018; 1020 hrs.   Physician-requested Follow-up: Return in about 3 weeks (around 04/09/2018).  Future Appointments  Date Time Provider [place]  04/10/2018 10:30 AM Gillis Santa, MD [facility] None  09/06/2018 10:30 AM [name], DO [facility]  11/23/2018 11:00 AM [facility] NURSE HEALTH ADVISOR [facility]   Primary Care Physician: [name], DO Location: [facility] Note by: Gillis Santa, MD Date: 03/19/2018; Time: 11:35 AM  Disclaimer:  Medicine is not an exact science. The only guarantee in medicine is that nothing is guaranteed. It is important to note that the decision to proceed with this intervention was based on the information collected from the patient. The Data and conclusions were drawn from the patient's questionnaire, the interview, and the physical examination.  Because the information was provided in large part by the patient, it cannot be guaranteed that it has not been purposely or unconsciously manipulated. Every effort has been made to  obtain as much relevant data as possible for this evaluation. It is important to note that the conclusions that lead to this procedure are derived in large part from the available data. Always take into account that the treatment will also be dependent on availability of resources and existing treatment guidelines, considered by other Pain Management Practitioners as being common knowledge and practice, at the time of the intervention. For Medico-Legal purposes, it is also important to point out that variation in procedural techniques and pharmacological choices are the acceptable norm. The indications, contraindications, technique, and results of the above procedure should only be interpreted and judged by a Board-Certified Interventional Pain Specialist with extensive familiarity and expertise in the same exact procedure and technique.

## 2018-03-19 NOTE — Progress Notes (Signed)
Safety precautions to be maintained throughout the outpatient stay will include: orient to surroundings, keep bed in low position, maintain call bell within reach at all times, provide assistance with transfer out of bed and ambulation.  

## 2018-03-19 NOTE — Patient Instructions (Signed)
____________________________________________________________________________________________  Post-Procedure Discharge Instructions  Instructions:  Apply ice: Fill a plastic sandwich bag with crushed ice. Cover it with a small towel and apply to injection site. Apply for 15 minutes then remove x 15 minutes. Repeat sequence on day of procedure, until you go to bed. The purpose is to minimize swelling and discomfort after procedure.  Apply heat: Apply heat to procedure site starting the day following the procedure. The purpose is to treat any soreness and discomfort from the procedure.  Food intake: Start with clear liquids (like water) and advance to regular food, as tolerated.   Physical activities: Keep activities to a minimum for the first 8 hours after the procedure.   Driving: If you have received any sedation, you are not allowed to drive for 24 hours after your procedure.  Blood thinner: Restart your blood thinner 6 hours after your procedure. (Only for those taking blood thinners)  Insulin: As soon as you can eat, you may resume your normal dosing schedule. (Only for those taking insulin)  Infection prevention: Keep procedure site clean and dry.  Post-procedure Pain Diary: Extremely important that this be done correctly and accurately. Recorded information will be used to determine the next step in treatment.  Pain evaluated is that of treated area only. Do not include pain from an untreated area.  Complete every hour, on the hour, for the initial 8 hours. Set an alarm to help you do this part accurately.  Do not go to sleep and have it completed later. It will not be accurate.  Follow-up appointment: Keep your follow-up appointment after the procedure. Usually 2 weeks for most procedures. (6 weeks in the case of radiofrequency.) Bring you pain diary.   Expect:  From numbing medicine (AKA: Local Anesthetics): Numbness or decrease in pain.  Onset: Full effect within 15  minutes of injected.  Duration: It will depend on the type of local anesthetic used. On the average, 1 to 8 hours.   From steroids: Decrease in swelling or inflammation. Once inflammation is improved, relief of the pain will follow.  Onset of benefits: Depends on the amount of swelling present. The more swelling, the longer it will take for the benefits to be seen. In some cases, up to 10 days.  Duration: Steroids will stay in the system x 2 weeks. Duration of benefits will depend on multiple posibilities including persistent irritating factors.  From procedure: Some discomfort is to be expected once the numbing medicine wears off. This should be minimal if ice and heat are applied as instructed.  Call if:  You experience numbness and weakness that gets worse with time, as opposed to wearing off.  New onset bowel or bladder incontinence. (This applies to Spinal procedures only)  Emergency Numbers:  Durning business hours (Monday - Thursday, 8:00 AM - 4:00 PM) (Friday, 9:00 AM - 12:00 Noon): [phone]  After hours: [phone] ____________________________________________________________________________________________  Post-op Pain Management on a Chronic Pain Patient  Why should the surgeon manage his patient's post-op pain? The Surgeon is uniquely qualified to determine the amount of post-operative pain to be expected on a procedure or surgery that he/she has performed. Even with similar surgeries, the surgeon's perspective on expected pain is unique, since he/she performed the procedure and knows the degree of difficulty and/or tissue damage involved in each particular case. The surgeon is also up to date on events such as blood loss, intraoperative complications, and PO (per orum) status that may influence not only the patient's  dose and schedule, but route of administration as well.  How about telling chronic pain patients to just double up or increase their usual pain  medication intake to compensate for the increased pain? This is a bad idea since it will lead to the patient running out of his/her usual medications early and this may create a problem at the level of the insurance, which supplies medications based on the amount and schedule stated on the prescription. Running out early may trigger an event where the refill is denied by the insurance company and/or pharmacy. In addition, this practice provides a very poor paper trail as to why this patient ran out of medication early. In addition, from the perspective of the pain physician, it creates a nightmare in the accounting of the patient's medication.  So, what should I do as a Psychologist, sport and exercise when confronted with a patient that needs surgery and already takes a significant amount of pain medicine for their chronic pain, which may or may not be related to the surgery I have to perform?  This is what the surgeon should do: 1. Do not change the dose or schedule of the pain medications prescribed by the pain specialist. This medication regimen allows for the patient's chronic pain to be under control, so as to bring that patient down to the level of an average individual. 2. Have the patient continue their usual pain management regimen, without any alterations. In addition, manage the post-operative pain as you would on any other "narcotic naive" patient. Do not attempt to compensate for tolerance. This is what the patient's usual regimen will do for you. Simply treat the patient as if they had no chronic pain and as if they were taking no other pain medications. 3. Talk to the patient about the medication, just like you would for anyone else. Do not assume that they are experts in opioids. Make sure you let the patient know that the medication is to be used only if absolutely necessary. (PRN) 4. Prescribe the medication for as long as you would on any other patient undergoing the same type of surgery. Prescribe for the same  average amount of time that you would on any other patient. Avoid prescribing for longer periods. 5. Send [place] a copy of the operative report with information about your choice of the post-op pain medication provided. 6. Keep [place] informed of any complications that may prolong the average duration patient's post-op pain.  If you have any questions, please feel to contact us at [phone]. _____________________________________________________________________________________________  Post-op Pain Management on a Chronic Pain Patient  Why should the surgeon manage his patient's post-op pain? The Surgeon is uniquely qualified to determine the amount of post-operative pain to be expected on a procedure or surgery that he/she has performed. Even with similar surgeries, the surgeon's perspective on expected pain is unique, since he/she performed the procedure and knows the degree of difficulty and/or tissue damage involved in each particular case. The surgeon is also up to date on events such as blood loss, intraoperative complications, and PO (per orum) status that may influence not only the patient's dose and schedule, but route of administration as well.  How about telling chronic pain patients to just double up or increase their usual pain medication intake to compensate for the increased pain? This is a bad idea since it will lead to the patient running out of his/her usual medications early and this may create a problem at the level of the insurance, which  supplies medications based on the amount and schedule stated on the prescription. Running out early may trigger an event where the refill is denied by the insurance company and/or pharmacy. In addition, this practice provides a very poor paper trail as to why this patient ran out of medication early. In addition, from the perspective of the pain physician, it creates a nightmare in the accounting of the patient's medication.  So, what should I do as  a Psychologist, sport and exercise when confronted with a patient that needs surgery and already takes a significant amount of pain medicine for their chronic pain, which may or may not be related to the surgery I have to perform?  This is what the surgeon should do: 7. Do not change the dose or schedule of the pain medications prescribed by the pain specialist. This medication regimen allows for the patient's chronic pain to be under control, so as to bring that patient down to the level of an average individual. 8. Have the patient continue their usual pain management regimen, without any alterations. In addition, manage the post-operative pain as you would on any other "narcotic naive" patient. Do not attempt to compensate for tolerance. This is what the patient's usual regimen will do for you. Simply treat the patient as if they had no chronic pain and as if they were taking no other pain medications. 9. Talk to the patient about the medication, just like you would for anyone else. Do not assume that they are experts in opioids. Make sure you let the patient know that the medication is to be used only if absolutely necessary. (PRN) 10. Prescribe the medication for as long as you would on any other patient undergoing the same type of surgery. Prescribe for the same average amount of time that you would on any other patient. Avoid prescribing for longer periods. 59. Send [place] a copy of the operative report with information about your choice of the post-op pain medication provided. 12. Keep [place] informed of any complications that may prolong the average duration patient's post-op pain.  If you have any questions, please feel to contact us at [phone]. _____________________________________________________________________________________________  Post-op Pain Management on a Chronic Pain Patient  Why should the surgeon manage his patient's post-op pain? The Surgeon is uniquely qualified to determine the amount of  post-operative pain to be expected on a procedure or surgery that he/she has performed. Even with similar surgeries, the surgeon's perspective on expected pain is unique, since he/she performed the procedure and knows the degree of difficulty and/or tissue damage involved in each particular case. The surgeon is also up to date on events such as blood loss, intraoperative complications, and PO (per orum) status that may influence not only the patient's dose and schedule, but route of administration as well.  How about telling chronic pain patients to just double up or increase their usual pain medication intake to compensate for the increased pain? This is a bad idea since it will lead to the patient running out of his/her usual medications early and this may create a problem at the level of the insurance, which supplies medications based on the amount and schedule stated on the prescription. Running out early may trigger an event where the refill is denied by the insurance company and/or pharmacy. In addition, this practice provides a very poor paper trail as to why this patient ran out of medication early. In addition, from the perspective of the pain physician, it creates a nightmare in the  accounting of the patient's medication.  So, what should I do as a Psychologist, sport and exercise when confronted with a patient that needs surgery and already takes a significant amount of pain medicine for their chronic pain, which may or may not be related to the surgery I have to perform?  This is what the surgeon should do: 13. Do not change the dose or schedule of the pain medications prescribed by the pain specialist. This medication regimen allows for the patient's chronic pain to be under control, so as to bring that patient down to the level of an average individual. 14. Have the patient continue their usual pain management regimen, without any alterations. In addition, manage the post-operative pain as you would on any other  "narcotic naive" patient. Do not attempt to compensate for tolerance. This is what the patient's usual regimen will do for you. Simply treat the patient as if they had no chronic pain and as if they were taking no other pain medications. 15. Talk to the patient about the medication, just like you would for anyone else. Do not assume that they are experts in opioids. Make sure you let the patient know that the medication is to be used only if absolutely necessary. (PRN) 16. Prescribe the medication for as long as you would on any other patient undergoing the same type of surgery. Prescribe for the same average amount of time that you would on any other patient. Avoid prescribing for longer periods. 71. Send [place] a copy of the operative report with information about your choice of the post-op pain medication provided. 18. Keep [place] informed of any complications that may prolong the average duration patient's post-op pain.  If you have any questions, please feel to contact us at [phone]. _____________________________________________________________________________________________  Post-op Pain Management on a Chronic Pain Patient  Why should the surgeon manage his patient's post-op pain? The Surgeon is uniquely qualified to determine the amount of post-operative pain to be expected on a procedure or surgery that he/she has performed. Even with similar surgeries, the surgeon's perspective on expected pain is unique, since he/she performed the procedure and knows the degree of difficulty and/or tissue damage involved in each particular case. The surgeon is also up to date on events such as blood loss, intraoperative complications, and PO (per orum) status that may influence not only the patient's dose and schedule, but route of administration as well.  How about telling chronic pain patients to just double up or increase their usual pain medication intake to compensate for the increased pain? This is a  bad idea since it will lead to the patient running out of his/her usual medications early and this may create a problem at the level of the insurance, which supplies medications based on the amount and schedule stated on the prescription. Running out early may trigger an event where the refill is denied by the insurance company and/or pharmacy. In addition, this practice provides a very poor paper trail as to why this patient ran out of medication early. In addition, from the perspective of the pain physician, it creates a nightmare in the accounting of the patient's medication.  So, what should I do as a Psychologist, sport and exercise when confronted with a patient that needs surgery and already takes a significant amount of pain medicine for their chronic pain, which may or may not be related to the surgery I have to perform?  This is what the surgeon should do: 19. Do not change the dose or schedule  of the pain medications prescribed by the pain specialist. This medication regimen allows for the patient's chronic pain to be under control, so as to bring that patient down to the level of an average individual. 20. Have the patient continue their usual pain management regimen, without any alterations. In addition, manage the post-operative pain as you would on any other "narcotic naive" patient. Do not attempt to compensate for tolerance. This is what the patient's usual regimen will do for you. Simply treat the patient as if they had no chronic pain and as if they were taking no other pain medications. 21. Talk to the patient about the medication, just like you would for anyone else. Do not assume that they are experts in opioids. Make sure you let the patient know that the medication is to be used only if absolutely necessary. (PRN) 22. Prescribe the medication for as long as you would on any other patient undergoing the same type of surgery. Prescribe for the same average amount of time that you would on any other patient.  Avoid prescribing for longer periods. 59. Send [place] a copy of the operative report with information about your choice of the post-op pain medication provided. 24. Keep [place] informed of any complications that may prolong the average duration patient's post-op pain.  If you have any questions, please feel to contact us at [phone]. _____________________________________________________________________________________________  ____________________________________________________________________________________________  Pain Scale  Introduction: The pain score used by this practice is the Verbal Numerical Rating Scale (VNRS-11). This is an 11-point scale. It is for adults and children 10 years or older. There are significant differences in how the pain score is reported, used, and applied. Forget everything you learned in the past and learn this scoring system.  General Information: The scale should reflect your current level of pain. Unless you are specifically asked for the level of your worst pain, or your average pain. If you are asked for one of these two, then it should be understood that it is over the past 24 hours.  Basic Activities of Daily Living (ADL): Personal hygiene, dressing, eating, transferring, and using restroom.  Instructions: Most patients tend to report their level of pain as a combination of two factors, their physical pain and their psychosocial pain. This last one is also known as "suffering" and it is reflection of how physical pain affects you socially and psychologically. From now on, report them separately. From this point on, when asked to report your pain level, report only your physical pain. Use the following table for reference.  Pain Clinic Pain Levels (0-5/10)  Pain Level Score  Description  No Pain 0   Mild pain 1 Nagging, annoying, but does not interfere with basic activities of daily living (ADL). Patients are able to eat, bathe, get dressed, toileting  (being able to get on and off the toilet and perform personal hygiene functions), transfer (move in and out of bed or a chair without assistance), and maintain continence (able to control bladder and bowel functions). Blood pressure and heart rate are unaffected. A normal heart rate for a healthy adult ranges from 60 to 100 bpm (beats per minute).   Mild to moderate pain 2 Noticeable and distracting. Impossible to hide from other people. More frequent flare-ups. Still possible to adapt and function close to normal. It can be very annoying and may have occasional stronger flare-ups. With discipline, patients may get used to it and adapt.   Moderate pain 3 Interferes significantly with activities  of daily living (ADL). It becomes difficult to feed, bathe, get dressed, get on and off the toilet or to perform personal hygiene functions. Difficult to get in and out of bed or a chair without assistance. Very distracting. With effort, it can be ignored when deeply involved in activities.   Moderately severe pain 4 Impossible to ignore for more than a few minutes. With effort, patients may still be able to manage work or participate in some social activities. Very difficult to concentrate. Signs of autonomic nervous system discharge are evident: dilated pupils (mydriasis); mild sweating (diaphoresis); sleep interference. Heart rate becomes elevated (>115 bpm). Diastolic blood pressure (lower number) rises above 100 mmHg. Patients find relief in laying down and not moving.   Severe pain 5 Intense and extremely unpleasant. Associated with frowning face and frequent crying. Pain overwhelms the senses.  Ability to do any activity or maintain social relationships becomes significantly limited. Conversation becomes difficult. Pacing back and forth is common, as getting into a comfortable position is nearly impossible. Pain wakes you up from deep sleep. Physical signs will be obvious: pupillary dilation; increased  sweating; goosebumps; brisk reflexes; cold, clammy hands and feet; nausea, vomiting or dry heaves; loss of appetite; significant sleep disturbance with inability to fall asleep or to remain asleep. When persistent, significant weight loss is observed due to the complete loss of appetite and sleep deprivation.  Blood pressure and heart rate becomes significantly elevated. Caution: If elevated blood pressure triggers a pounding headache associated with blurred vision, then the patient should immediately seek attention at an urgent or emergency care unit, as these may be signs of an impending stroke.    Emergency Department Pain Levels (6-10/10)  Emergency Room Pain 6 Severely limiting. Requires emergency care and should not be seen or managed at an outpatient pain management facility. Communication becomes difficult and requires great effort. Assistance to reach the emergency department may be required. Facial flushing and profuse sweating along with potentially dangerous increases in heart rate and blood pressure will be evident.   Distressing pain 7 Self-care is very difficult. Assistance is required to transport, or use restroom. Assistance to reach the emergency department will be required. Tasks requiring coordination, such as bathing and getting dressed become very difficult.   Disabling pain 8 Self-care is no longer possible. At this level, pain is disabling. The individual is unable to do even the most "basic" activities such as walking, eating, bathing, dressing, transferring to a bed, or toileting. Fine motor skills are lost. It is difficult to think clearly.   Incapacitating pain 9 Pain becomes incapacitating. Thought processing is no longer possible. Difficult to remember your own name. Control of movement and coordination are lost.   The worst pain imaginable 10 At this level, most patients pass out from pain. When this level is reached, collapse of the autonomic nervous system occurs,  leading to a sudden drop in blood pressure and heart rate. This in turn results in a temporary and dramatic drop in blood flow to the brain, leading to a loss of consciousness. Fainting is one of the body's self defense mechanisms. Passing out puts the brain in a calmed state and causes it to shut down for a while, in order to begin the healing process.    Summary: 1. Refer to this scale when providing [place] with your pain level. 2. Be accurate and careful when reporting your pain level. This will help with your care. 3. Over-reporting your pain level will lead to  loss of credibility. 4. Even a level of 1/10 means that there is pain and will be treated at our facility. 5. High, inaccurate reporting will be documented as "Symptom Exaggeration", leading to loss of credibility and suspicions of possible secondary gains such as obtaining more narcotics, or wanting to appear disabled, for fraudulent reasons. 6. Only pain levels of 5 or below will be seen at our facility. 7. Pain levels of 6 and above will be sent to the Emergency Department and the appointment cancelled. ____________________________________________________________________________________________   Facet Joint Block The facet joints connect the bones of the spine (vertebrae). They make it possible for you to bend, twist, and make other movements with your spine. They also keep you from bending too far, twisting too far, and making other excessive movements. A facet joint block is a procedure where a numbing medicine (anesthetic) is injected into a facet joint. Often, a type of anti-inflammatory medicine called a steroid is also injected. A facet joint block may be done to diagnose neck or back pain. If the pain gets better after a facet joint block, it means the pain is probably coming from the facet joint. If the pain does not get better, it means the pain is probably not coming from the facet joint. A facet joint block may also be done to  relieve neck or back pain caused by an inflamed facet joint. A facet joint block is only done to relieve pain if the pain does not improve with other methods, such as medicine, exercise programs, and physical therapy. Tell a health care provider about:  Any allergies you have.  All medicines you are taking, including vitamins, herbs, eye drops, creams, and over-the-counter medicines.  Any problems you or family members have had with anesthetic medicines.  Any blood disorders you have.  Any surgeries you have had.  Any medical conditions you have.  Whether you are pregnant or may be pregnant. What are the risks? Generally, this is a safe procedure. However, problems may occur, including:  Bleeding.  Injury to a nerve near the injection site.  Pain at the injection site.  Weakness or numbness in areas controlled by nerves near the injection site.  Infection.  Temporary fluid retention.  Allergic reactions to medicines or dyes.  Injury to other structures or organs near the injection site.  What happens before the procedure?  Follow instructions from your health care provider about eating or drinking restrictions.  Ask your health care provider about: ? Changing or stopping your regular medicines. This is especially important if you are taking diabetes medicines or blood thinners. ? Taking medicines such as aspirin and ibuprofen. These medicines can thin your blood. Do not take these medicines before your procedure if your health care provider instructs you not to.  Do not take any new dietary supplements or medicines without asking your health care provider first.  Plan to have someone take you home after the procedure. What happens during the procedure?  You may need to remove your clothing and dress in an open-back gown.  The procedure will be done while you are lying on an X-ray table. You will most likely be asked to lie on your stomach, but you may be asked to lie  in a different position if an injection will be made in your neck.  Machines will be used to monitor your oxygen levels, heart rate, and blood pressure.  If an injection will be made in your neck, an IV tube will be  inserted into one of your veins. Fluids and medicine will flow directly into your body through the IV tube.  The area over the facet joint where the injection will be made will be cleaned with soap. The surrounding skin will be covered with clean drapes.  A numbing medicine (local anesthetic) will be applied to your skin. Your skin may sting or burn for a moment.  A video X-ray machine (fluoroscopy) will be used to locate the joint. In some cases, a CT scan may be used.  A contrast dye may be injected into the facet joint area to help locate the joint.  When the joint is located, an anesthetic will be injected into the joint through the needle.  Your health care provider will ask you whether you feel pain relief. If you do feel relief, a steroid may be injected to provide pain relief for a longer period of time. If you do not feel relief or feel only partial relief, additional injections of an anesthetic may be made in other facet joints.  The needle will be removed.  Your skin will be cleaned.  A bandage (dressing) will be applied over each injection site. The procedure may vary among health care providers and hospitals. What happens after the procedure?  You will be observed for 15-30 minutes before being allowed to go home. This information is not intended to replace advice given to you by your health care provider. Make sure you discuss any questions you have with your health care provider. Document Released: 12/21/2006 Document Revised: 09/02/2015 Document Reviewed: 04/27/2015 Elsevier Interactive Patient Education  [employer].

## 2018-03-20 ENCOUNTER — Telehealth: Payer: Self-pay

## 2018-03-20 NOTE — Telephone Encounter (Signed)
Denies any needs at this time. "I feel good". Instructed to call if needed.

## 2018-03-23 ENCOUNTER — Telehealth: Payer: Self-pay | Admitting: Family Medicine

## 2018-03-23 DIAGNOSIS — I739 Peripheral vascular disease, unspecified: Secondary | ICD-10-CM

## 2018-03-23 DIAGNOSIS — H44521 Atrophy of globe, right eye: Secondary | ICD-10-CM | POA: Diagnosis not present

## 2018-03-23 NOTE — Telephone Encounter (Signed)
Referral generated

## 2018-03-23 NOTE — Telephone Encounter (Signed)
Copied from [place] [id number]. Topic: Referral - Request >> Mar 23, 2018 11:28 AM Burchel, Abbi R wrote: CRM for notification. See Telephone encounter for: 03/23/18.  Pt is requesting a referral to [facility]    Pt: [phone]

## 2018-04-10 ENCOUNTER — Other Ambulatory Visit: Payer: Self-pay

## 2018-04-10 ENCOUNTER — Ambulatory Visit
Payer: Medicare Other | Attending: Student in an Organized Health Care Education/Training Program | Admitting: Student in an Organized Health Care Education/Training Program

## 2018-04-10 ENCOUNTER — Encounter: Payer: Self-pay | Admitting: Student in an Organized Health Care Education/Training Program

## 2018-04-10 VITALS — BP 147/81 | HR 129 | Temp 97.9°F | Resp 18 | Ht 61.0 in | Wt 102.0 lb

## 2018-04-10 DIAGNOSIS — Z9889 Other specified postprocedural states: Secondary | ICD-10-CM | POA: Insufficient documentation

## 2018-04-10 DIAGNOSIS — J449 Chronic obstructive pulmonary disease, unspecified: Secondary | ICD-10-CM | POA: Insufficient documentation

## 2018-04-10 DIAGNOSIS — G894 Chronic pain syndrome: Secondary | ICD-10-CM | POA: Diagnosis not present

## 2018-04-10 DIAGNOSIS — F17210 Nicotine dependence, cigarettes, uncomplicated: Secondary | ICD-10-CM | POA: Insufficient documentation

## 2018-04-10 DIAGNOSIS — M545 Low back pain: Secondary | ICD-10-CM | POA: Diagnosis present

## 2018-04-10 DIAGNOSIS — M5136 Other intervertebral disc degeneration, lumbar region: Secondary | ICD-10-CM | POA: Diagnosis not present

## 2018-04-10 DIAGNOSIS — M47814 Spondylosis without myelopathy or radiculopathy, thoracic region: Secondary | ICD-10-CM | POA: Diagnosis not present

## 2018-04-10 DIAGNOSIS — M47816 Spondylosis without myelopathy or radiculopathy, lumbar region: Secondary | ICD-10-CM | POA: Insufficient documentation

## 2018-04-10 DIAGNOSIS — I10 Essential (primary) hypertension: Secondary | ICD-10-CM | POA: Insufficient documentation

## 2018-04-10 DIAGNOSIS — Z79891 Long term (current) use of opiate analgesic: Secondary | ICD-10-CM | POA: Insufficient documentation

## 2018-04-10 DIAGNOSIS — M419 Scoliosis, unspecified: Secondary | ICD-10-CM | POA: Insufficient documentation

## 2018-04-10 DIAGNOSIS — M4120 Other idiopathic scoliosis, site unspecified: Secondary | ICD-10-CM | POA: Diagnosis not present

## 2018-04-10 DIAGNOSIS — M48062 Spinal stenosis, lumbar region with neurogenic claudication: Secondary | ICD-10-CM | POA: Insufficient documentation

## 2018-04-10 DIAGNOSIS — Z79899 Other long term (current) drug therapy: Secondary | ICD-10-CM | POA: Diagnosis not present

## 2018-04-10 DIAGNOSIS — Z90710 Acquired absence of both cervix and uterus: Secondary | ICD-10-CM | POA: Insufficient documentation

## 2018-04-10 DIAGNOSIS — I739 Peripheral vascular disease, unspecified: Secondary | ICD-10-CM | POA: Insufficient documentation

## 2018-04-10 NOTE — Progress Notes (Signed)
Safety precautions to be maintained throughout the outpatient stay will include: orient to surroundings, keep bed in low position, maintain call bell within reach at all times, provide assistance with transfer out of bed and ambulation.  

## 2018-04-10 NOTE — Patient Instructions (Addendum)
Preparing for Procedure with Sedation Instructions: . Oral Intake: Do not eat or drink anything for at least 8 hours prior to your procedure. . Transportation: Public transportation is not allowed. Bring an adult driver. The driver must be physically present in our waiting room before any procedure can be started. Marland Kitchen Physical Assistance: Bring an adult capable of physically assisting you, in the event you need help. . Blood Pressure Medicine: Take your blood pressure medicine with a sip of water the morning of the procedure. . Insulin: Take only  of your normal insulin dose. . Preventing infections: Shower with an antibacterial soap the morning of your procedure. . Build-up your immune system: Take 1000 mg of Vitamin C with every meal (3 times a day) the day prior to your procedure. . Pregnancy: If you are pregnant, call and cancel the procedure. . Sickness: If you have a cold, fever, or any active infections, call and cancel the procedure. . Arrival: You must be in the facility at least 30 minutes prior to your scheduled procedure. . Children: Do not bring children with you. . Dress appropriately: Bring dark clothing that you would not mind if they get stained. . Valuables: Do not bring any jewelry or valuables. Procedure appointments are reserved for interventional treatments only. Marland Kitchen No Prescription Refills. . No medication changes will be discussed during procedure appointments. No disability issues will be discussed.Radiofrequency Lesioning Radiofrequency lesioning is a procedure that is performed to relieve pain. The procedure is often used for back, neck, or arm pain. Radiofrequency lesioning involves the use of a machine that creates radio waves to make heat. During the procedure, the heat is applied to the nerve that carries the pain signal. The heat damages the nerve and interferes with the pain signal. Pain relief usually starts about 2 weeks after the procedure and lasts for 6 months to  1 year. Tell a health care provider about: Any allergies you have. All medicines you are taking, including vitamins, herbs, eye drops, creams, and over-the-counter medicines. Any problems you or family members have had with anesthetic medicines. Any blood disorders you have. Any surgeries you have had. Any medical conditions you have. Whether you are pregnant or may be pregnant. What are the risks? Generally, this is a safe procedure. However, problems may occur, including: Pain or soreness at the injection site. Infection at the injection site. Damage to nerves or blood vessels.  What happens before the procedure? Ask your health care provider about: Changing or stopping your regular medicines. This is especially important if you are taking diabetes medicines or blood thinners. Taking medicines such as aspirin and ibuprofen. These medicines can thin your blood. Do not take these medicines before your procedure if your health care provider instructs you not to. Follow instructions from your health care provider about eating or drinking restrictions. Plan to have someone take you home after the procedure. If you go home right after the procedure, plan to have someone with you for 24 hours. What happens during the procedure? You will be given one or more of the following: A medicine to help you relax (sedative). A medicine to numb the area (local anesthetic). You will be awake during the procedure. You will need to be able to talk with the health care provider during the procedure. With the help of a type of X-ray (fluoroscopy), the health care provider will insert a radiofrequency needle into the area to be treated. Next, a wire that carries the radio waves (electrode) will  be put through the radiofrequency needle. An electrical pulse will be sent through the electrode to verify the correct nerve. You will feel a tingling sensation, and you may have muscle twitching. Then, the tissue that  is around the needle tip will be heated by an electric current that is passed using the radiofrequency machine. This will numb the nerves. A bandage (dressing) will be put on the insertion area after the procedure is done. The procedure may vary among health care providers and hospitals. What happens after the procedure? Your blood pressure, heart rate, breathing rate, and blood oxygen level will be monitored often until the medicines you were given have worn off. Return to your normal activities as directed by your health care provider. This information is not intended to replace advice given to you by your health care provider. Make sure you discuss any questions you have with your health care provider. Document Released: 03/30/2011 Document Revised: 01/07/2016 Document Reviewed: 09/08/2014 Elsevier Interactive Patient Education  2018 [employer]  What are the risk, side effects and possible complications? Generally speaking, most procedures are safe.  However, with any procedure there are risks, side effects, and the possibility of complications.  The risks and complications are dependent upon the sites that are lesioned, or the type of nerve block to be performed.  The closer the procedure is to the spine, the more serious the risks are.  Great care is taken when placing the radio frequency needles, block needles or lesioning probes, but sometimes complications can occur. Infection: Any time there is an injection through the skin, there is a risk of infection.  This is why sterile conditions are used for these blocks.  There are four possible types of infection. Localized skin infection. Central Nervous System Infection-This can be in the form of Meningitis, which can be deadly. Epidural Infections-This can be in the form of an epidural abscess, which can cause pressure inside of the spine, causing compression of the spinal cord with subsequent paralysis. This  would require an emergency surgery to decompress, and there are no guarantees that the patient would recover from the paralysis. Discitis-This is an infection of the intervertebral discs.  It occurs in about 1% of discography procedures.  It is difficult to treat and it may lead to surgery.        2. Pain: the needles have to go through skin and soft tissues, will cause soreness.       3. Damage to internal structures:  The nerves to be lesioned may be near blood vessels or    other nerves which can be potentially damaged.       4. Bleeding: Bleeding is more common if the patient is taking blood thinners such as  aspirin, Coumadin, Ticiid, Plavix, etc., or if he/she have some genetic predisposition  such as hemophilia. Bleeding into the spinal canal can cause compression of the spinal  cord with subsequent paralysis.  This would require an emergency surgery to  decompress and there are no guarantees that the patient would recover from the  paralysis.       5. Pneumothorax:  Puncturing of a lung is a possibility, every time a needle is introduced in  the area of the chest or upper back.  Pneumothorax refers to free air around the  collapsed lung(s), inside of the thoracic cavity (chest cavity).  Another two possible  complications related to a similar event would include: Hemothorax and Chylothorax.   These  are variations of the Pneumothorax, where instead of air around the collapsed  lung(s), you may have blood or chyle, respectively.       6. Spinal headaches: They may occur with any procedures in the area of the spine.       7. Persistent CSF (Cerebro-Spinal Fluid) leakage: This is a rare problem, but may occur  with prolonged intrathecal or epidural catheters either due to the formation of a fistulous  track or a dural tear.       8. Nerve damage: By working so close to the spinal cord, there is always a possibility of  nerve damage, which could be as serious as a permanent spinal cord injury with   paralysis.       9. Death:  Although rare, severe deadly allergic reactions known as "Anaphylactic  reaction" can occur to any of the medications used.      10. Worsening of the symptoms:  We can always make thing worse.  What are the chances of something like this happening? Chances of any of this occuring are extremely low.  By statistics, you have more of a chance of getting killed in a motor vehicle accident: while driving to the hospital than any of the above occurring .  Nevertheless, you should be aware that they are possibilities.  In general, it is similar to taking a shower.  Everybody knows that you can slip, hit your head and get killed.  Does that mean that you should not shower again?  Nevertheless always keep in mind that statistics do not mean anything if you happen to be on the wrong side of them.  Even if a procedure has a 1 (one) in a 1,000,000 (million) chance of going wrong, it you happen to be that one..Also, keep in mind that by statistics, you have more of a chance of having something go wrong when taking medications.  Who should not have this procedure? If you are on a blood thinning medication (e.g. Coumadin, Plavix, see list of "Blood Thinners"), or if you have an active infection going on, you should not have the procedure.  If you are taking any blood thinners, please inform your physician.  How should I prepare for this procedure? Do not eat or drink anything at least six hours prior to the procedure. Bring a driver with you .  It cannot be a taxi. Come accompanied by an adult that can drive you back, and that is strong enough to help you if your legs get weak or numb from the local anesthetic. Take all of your medicines the morning of the procedure with just enough water to swallow them. If you have diabetes, make sure that you are scheduled to have your procedure done first thing in the morning, whenever possible. If you have diabetes, take only half of your insulin  dose and notify our nurse that you have done so as soon as you arrive at the clinic. If you are diabetic, but only take blood sugar pills (oral hypoglycemic), then do not take them on the morning of your procedure.  You may take them after you have had the procedure. Do not take aspirin or any aspirin-containing medications, at least eleven (11) days prior to the procedure.  They may prolong bleeding. Wear loose fitting clothing that may be easy to take off and that you would not mind if it got stained with Betadine or blood. Do not wear any jewelry or perfume Remove any nail coloring.  It will interfere with some of our monitoring equipment.  NOTE: Remember that this is not meant to be interpreted as a complete list of all possible complications.  Unforeseen problems may occur.  BLOOD THINNERS The following drugs contain aspirin or other products, which can cause increased bleeding during surgery and should not be taken for 2 weeks prior to and 1 week after surgery.  If you should need take something for relief of minor pain, you may take acetaminophen which is found in Tylenol,m Datril, Anacin-3 and Panadol. It is not blood thinner. The products listed below are.  Do not take any of the products listed below in addition to any listed on your instruction sheet.  A.P.C or A.P.C with Codeine Codeine Phosphate Capsules #3 Ibuprofen Ridaura  ABC compound Congesprin Imuran rimadil  Advil Cope Indocin Robaxisal  Alka-Seltzer Effervescent Pain Reliever and Antacid Coricidin or Coricidin-D  Indomethacin Rufen  Alka-Seltzer plus Cold Medicine Cosprin Ketoprofen S-A-C Tablets  Anacin Analgesic Tablets or Capsules Coumadin Korlgesic Salflex  Anacin Extra Strength Analgesic tablets or capsules CP-2 Tablets Lanoril Salicylate  Anaprox Cuprimine Capsules Levenox Salocol  Anexsia-D Dalteparin Magan Salsalate  Anodynos Darvon compound Magnesium Salicylate Sine-off  Ansaid Dasin Capsules Magsal Sodium  Salicylate  Anturane Depen Capsules Marnal Soma  APF Arthritis pain formula Dewitt's Pills Measurin Stanback  Argesic Dia-Gesic Meclofenamic Sulfinpyrazone  Arthritis Bayer Timed Release Aspirin Diclofenac Meclomen Sulindac  Arthritis pain formula Anacin Dicumarol Medipren Supac  Analgesic (Safety coated) Arthralgen Diffunasal Mefanamic Suprofen  Arthritis Strength Bufferin Dihydrocodeine Mepro Compound Suprol  Arthropan liquid Dopirydamole Methcarbomol with Aspirin Synalgos  ASA tablets/Enseals Disalcid Micrainin Tagament  Ascriptin Doan's Midol Talwin  Ascriptin A/D Dolene Mobidin Tanderil  Ascriptin Extra Strength Dolobid Moblgesic Ticlid  Ascriptin with Codeine Doloprin or Doloprin with Codeine Momentum Tolectin  Asperbuf Duoprin Mono-gesic Trendar  Aspergum Duradyne Motrin or Motrin IB Triminicin  Aspirin plain, buffered or enteric coated Durasal Myochrisine Trigesic  Aspirin Suppositories Easprin Nalfon Trillsate  Aspirin with Codeine Ecotrin Regular or Extra Strength Naprosyn Uracel  Atromid-S Efficin Naproxen Ursinus  Auranofin Capsules Elmiron Neocylate Vanquish  Axotal Emagrin Norgesic Verin  Azathioprine Empirin or Empirin with Codeine Normiflo Vitamin E  Azolid Emprazil Nuprin Voltaren  Bayer Aspirin plain, buffered or children's or timed BC Tablets or powders Encaprin Orgaran Warfarin Sodium  Buff-a-Comp Enoxaparin Orudis Zorpin  Buff-a-Comp with Codeine Equegesic Os-Cal-Gesic   Buffaprin Excedrin plain, buffered or Extra Strength Oxalid   Bufferin Arthritis Strength Feldene Oxphenbutazone   Bufferin plain or Extra Strength Feldene Capsules Oxycodone with Aspirin   Bufferin with Codeine Fenoprofen Fenoprofen Pabalate or Pabalate-SF   Buffets II Flogesic Panagesic   Buffinol plain or Extra Strength Florinal or Florinal with Codeine Panwarfarin   Buf-Tabs Flurbiprofen Penicillamine   Butalbital Compound Four-way cold tablets Penicillin   Butazolidin Fragmin Pepto-Bismol    Carbenicillin Geminisyn Percodan   Carna Arthritis Reliever Geopen Persantine   Carprofen Gold's salt Persistin   Chloramphenicol Goody's Phenylbutazone   Chloromycetin Haltrain Piroxlcam   Clmetidine heparin Plaquenil   Cllnoril Hyco-pap Ponstel   Clofibrate Hydroxy chloroquine Propoxyphen         Before stopping any of these medications, be sure to consult the physician who ordered them.  Some, such as Coumadin (Warfarin) are ordered to prevent or treat serious conditions such as "deep thrombosis", "pumonary embolisms", and other heart problems.  The amount of time that you may need off of the medication may also vary with the medication and the reason for which you  were taking it.  If you are taking any of these medications, please make sure you notify your pain physician before you undergo any procedures.         Marland Kitchen

## 2018-04-10 NOTE — Progress Notes (Signed)
Patient's Name: Savannah Mcclure  MRN: [id number]  Referring Provider: [name], DO  DOB: 1940-12-28  PCP: [name], DO  DOS: 04/10/2018  Note by: Gillis Santa, MD  Service setting: Ambulatory outpatient  Specialty: Interventional Pain Management  Location: [facility] (AMB) Pain Management Facility    Patient type: Established   Primary Reason(s) for Visit: Encounter for post-procedure evaluation of chronic illness with mild to moderate exacerbation CC: Back Pain (low)  HPI  Savannah Mcclure is a 77 y.o. year old, female patient, who comes today for a post-procedure evaluation. She has HTN (hypertension); COPD (chronic obstructive pulmonary disease) ([place]); History of tobacco abuse; Scoliosis (and kyphoscoliosis), idiopathic; Spinal stenosis of lumbar region with neurogenic claudication; PAD (peripheral artery disease) ([place]); Skin lesions; Vision loss; Tachycardia; Anxiety; Anemia; Hypokalemia; Abnormal weight loss; Controlled substance agreement signed; Lumbar degenerative disc disease; Chronic, continuous use of opioids; Chronic left shoulder pain; Pain of both hip joints; and Chronic pain syndrome on their problem list. Her primarily concern today is the Back Pain (low)  Pain Assessment: Location: Lower Back Radiating: denies Onset: More than a month ago Duration: Chronic pain Quality: Aching, Constant, Sharp Severity: 2 /10 (subjective, self-reported pain score)  Note: Reported level is compatible with observation.                         When using our objective Pain Scale, levels between 6 and 10/10 are said to belong in an emergency room, as it progressively worsens from a 6/10, described as severely limiting, requiring emergency care not usually available at an outpatient pain management facility. At a 6/10 level, communication becomes difficult and requires great effort. Assistance to reach the emergency department may be required. Facial flushing and profuse sweating  along with potentially dangerous increases in heart rate and blood pressure will be evident. Effect on ADL: "I cant do my activities I like doing" Timing: Constant Modifying factors: medications, rest,  BP: (!) 147/81  HR: (!) 129  Savannah Mcclure comes in today for post-procedure evaluation after the treatment done on 03/20/2018.  Further details on both, my assessment(s), as well as the proposed treatment plan, please see below.  Post-Procedure Assessment  03/19/2018 Procedure: T12, L1, L2 medial branch nerve block Pre-procedure pain score:  3/10 Post-procedure pain score: 0/10         Influential Factors: BMI: 19.27 kg/m Intra-procedural challenges: None observed.         Assessment challenges: None detected.              Reported side-effects: None.        Post-procedural adverse reactions or complications: None reported         Sedation: Please see nurses note. When no sedatives are used, the analgesic levels obtained are directly associated to the effectiveness of the local anesthetics. However, when sedation is provided, the level of analgesia obtained during the initial 1 hour following the intervention, is believed to be the result of a combination of factors. These factors may include, but are not limited to: 1. The effectiveness of the local anesthetics used. 2. The effects of the analgesic(s) and/or anxiolytic(s) used. 3. The degree of discomfort experienced by the patient at the time of the procedure. 4. The patients ability and reliability in recalling and recording the events. 5. The presence and influence of possible secondary gains and/or psychosocial factors. Reported result: Relief experienced during the 1st hour after the procedure: 100 % (  Ultra-Short Term Relief)            Interpretative annotation: Clinically appropriate result. Analgesia during this period is likely to be Local Anesthetic and/or IV Sedative (Analgesic/Anxiolytic) related.          Effects of  local anesthetic: The analgesic effects attained during this period are directly associated to the localized infiltration of local anesthetics and therefore cary significant diagnostic value as to the etiological location, or anatomical origin, of the pain. Expected duration of relief is directly dependent on the pharmacodynamics of the local anesthetic used. Long-acting (4-6 hours) anesthetics used.  Reported result: Relief during the next 4 to 6 hour after the procedure: 100 % (Short-Term Relief)            Interpretative annotation: Clinically appropriate result. Analgesia during this period is likely to be Local Anesthetic-related.          Long-term benefit: Defined as the period of time past the expected duration of local anesthetics (1 hour for short-acting and 4-6 hours for long-acting). With the possible exception of prolonged sympathetic blockade from the local anesthetics, benefits during this period are typically attributed to, or associated with, other factors such as analgesic sensory neuropraxia, antiinflammatory effects, or beneficial biochemical changes provided by agents other than the local anesthetics.  Reported result: Extended relief following procedure: 75 %(lasting 2-3 days then slowly returned. ) (Long-Term Relief)            Interpretative annotation: Clinically possible results. Good relief. No permanent benefit expected. Inflammation plays a part in the etiology to the pain.          Current benefits: Defined as reported results that persistent at this point in time.   Analgesia: <25 %            Function: Somewhat improved ROM: Somewhat improved Interpretative annotation: Recurrence of symptoms. No permanent benefit expected. Effective diagnostic intervention.          Interpretation: Results would suggest a successful diagnostic intervention.                  Plan:  Proceed with Radiofrequency Ablation for the purpose of attaining long-term benefits.                 Laboratory Chemistry  Inflammation Markers (CRP: Acute Phase) (ESR: Chronic Phase) Lab Results  Component Value Date   LATICACIDVEN 1.8 10/09/2017                         Rheumatology Markers No results found for: RF, ANA, LABURIC, URICUR, LYMEIGGIGMAB, LYMEABIGMQN, HLAB27                      Renal Function Markers Lab Results  Component Value Date   BUN 17 10/31/2017   CREATININE 0.87 10/31/2017   BCR 16 10/30/2017   GFRAA >60 10/31/2017   GFRNONAA >60 10/31/2017                             Hepatic Function Markers Lab Results  Component Value Date   AST 13 10/30/2017   ALT 9 10/30/2017   ALBUMIN 4.4 10/30/2017   ALKPHOS 82 10/30/2017                        Electrolytes Lab Results  Component Value Date   NA 137 10/31/2017   K  3.4 (L) 10/31/2017   CL 101 10/31/2017   CALCIUM 8.9 10/31/2017   MG 1.8 10/13/2017   PHOS 3.2 10/13/2017                        Neuropathy Markers No results found for: VITAMINB12, FOLATE, HGBA1C, HIV                      Bone Pathology Markers No results found for: VD25OH, VD125OH2TOT, [id number], [id number], 25OHVITD1, 25OHVITD2, 25OHVITD3, TESTOFREE, TESTOSTERONE                       Coagulation Parameters Lab Results  Component Value Date   INR 1.07 10/07/2017   LABPROT 13.8 10/07/2017   PLT 412 10/31/2017                        Cardiovascular Markers Lab Results  Component Value Date   BNP 326 (H) 09/10/2012   CKTOTAL 65 09/10/2012   CKMB < 0.5 (L) 09/10/2012   TROPONINI <0.03 10/31/2017   HGB 11.1 (L) 10/31/2017   HCT 33.8 (L) 10/31/2017                         CA Markers No results found for: CEA, CA125, LABCA2                      Note: Lab results reviewed.  Recent Diagnostic Imaging Results  DG C-Arm 1-60 Min-No Report Fluoroscopy was utilized by the requesting physician.  No radiographic  interpretation.   Complexity Note: Imaging results reviewed. Results shared with Savannah Mcclure, using Layman's  terms.                         Meds   Current Outpatient Medications:  .  B-COMPLEX-C PO, Take 1 tablet by mouth daily., Disp: , Rfl:  .  benazepril (LOTENSIN) 20 MG tablet, Take 1 tablet (20 mg total) by mouth daily., Disp: 90 tablet, Rfl: 3 .  cilostazol (PLETAL) 100 MG tablet, Take 1 tablet (100 mg total) by mouth 2 (two) times daily., Disp: 180 tablet, Rfl: 1 .  cyclobenzaprine (FLEXERIL) 5 MG tablet, Take 1 tablet (5 mg total) by mouth 2 (two) times daily as needed for muscle spasms., Disp: 60 tablet, Rfl: 2 .  diltiazem (CARDIZEM) 30 MG tablet, Take 1 tablet (30 mg total) by mouth 3 (three) times daily as needed (Tachycardia HR > 130.)., Disp: 180 tablet, Rfl: 1 .  gabapentin (NEURONTIN) 300 MG capsule, Take 1 capsule (300 mg total) by mouth 2 (two) times daily., Disp: 60 capsule, Rfl: 3 .  hydrOXYzine (ATARAX/VISTARIL) 25 MG tablet, Take 1 tablet (25 mg total) by mouth 3 (three) times daily as needed., Disp: 270 tablet, Rfl: 3 .  ipratropium-albuterol (DUONEB) 0.5-2.5 (3) MG/3ML SOLN, Take 3 mLs by nebulization every 6 (six) hours as needed., Disp: 360 mL, Rfl: 6 .  lidocaine (LIDODERM) 5 %, Place 1 patch onto the skin daily. Remove & Discard patch within 12 hours or as directed by MD, Disp: 60 patch, Rfl: 6 .  loperamide (ANTI-DIARRHEAL) 2 MG tablet, Take 2 mg by mouth 4 (four) times daily as needed for diarrhea or loose stools., Disp: , Rfl:  .  montelukast (SINGULAIR) 10 MG tablet, Take 1 tablet (10 mg total) by mouth at bedtime., Disp:  90 tablet, Rfl: 3 .  Multiple Vitamins-Minerals (MULTIVITAMIN WITH MINERALS) tablet, Take 1 tablet by mouth daily., Disp: , Rfl:  .  oxyCODONE (OXY IR/ROXICODONE) 5 MG immediate release tablet, Take 1 tablet (5 mg total) by mouth 3 (three) times daily as needed for severe pain. For chronic pain To last for 30 days from fill date, Disp: 90 tablet, Rfl: 0 .  tiotropium (SPIRIVA HANDIHALER) 18 MCG inhalation capsule, Place 1 capsule (18 mcg total) into  inhaler and inhale daily., Disp: 90 capsule, Rfl: 3 .  triamcinolone ointment (KENALOG) 0.5 %, Apply 1 application topically 2 (two) times daily., Disp: 30 g, Rfl: 0 .  UNABLE TO FIND, Take 1 tablet by mouth 2 (two) times daily. Cardio FX, Disp: , Rfl:  .  UNABLE TO FIND, Take 1 tablet by mouth daily. Cinnergy, Disp: , Rfl:  .  UNABLE TO FIND, Take 2 tablets by mouth daily. Joint FX, Disp: , Rfl:   Current Facility-Administered Medications:  .  albuterol (PROVENTIL) (2.5 MG/3ML) 0.083% nebulizer solution 2.5 mg, 2.5 mg, Nebulization, Once, Johnson, Megan P, DO  ROS  Constitutional: Denies any fever or chills Gastrointestinal: No reported hemesis, hematochezia, vomiting, or acute GI distress Musculoskeletal: Denies any acute onset joint swelling, redness, loss of ROM, or weakness Neurological: No reported episodes of acute onset apraxia, aphasia, dysarthria, agnosia, amnesia, paralysis, loss of coordination, or loss of consciousness  Allergies  Savannah Mcclure is allergic to other; amlodipine; azithromycin; doxycycline; erythromycin; hydrocodone; lactose intolerance (gi); minocycline; nsaids; relafen [nabumetone]; sulfa antibiotics; and tetracyclines & related.  [place]  Drug: Savannah Mcclure  reports that she does not use drugs. Alcohol:  reports that she drinks alcohol. Tobacco:  reports that she has been smoking. She has been smoking about 0.50 packs per day. She has quit using smokeless tobacco. Medical:  has a past medical history of Aneurysm ([place]), Blind right eye, Cardiac arrest ([place]), Cataract, COPD (chronic obstructive pulmonary disease) ([place]), Hypertension, Scoliosis, Sepsis ([place]), and Spinal stenosis. Surgical: Savannah Mcclure  has a past surgical history that includes right eye surgery; Wisdom tooth extraction; Back surgery; Abdominal hysterectomy; and Breast lumpectomy (Left). Family: family history includes Heart disease in her paternal grandfather and paternal grandmother;  Heart failure in her father; Hypertension in her mother; Intracerebral hemorrhage in her mother; Kidney disease in her maternal grandmother; Rheum arthritis in her maternal grandmother; Schizophrenia in her daughter; Stroke in her paternal grandfather and paternal grandmother.  Constitutional Exam  General appearance: Well nourished, well developed, and well hydrated. In no apparent acute distress Vitals:   04/10/18 1033  BP: (!) 147/81  Pulse: (!) 129  Resp: 18  Temp: 97.9 F (36.6 C)  SpO2: 100%  Weight: 102 lb (46.3 kg)  Height: '5\' 1"'  (1.549 m)   BMI Assessment: Estimated body mass index is 19.27 kg/m as calculated from the following:   Height as of this encounter: '5\' 1"'  (1.549 m).   Weight as of this encounter: 102 lb (46.3 kg).  BMI interpretation table: BMI level Category Range association with higher incidence of chronic pain  <18 kg/m2 Underweight   18.5-24.9 kg/m2 Ideal body weight   25-29.9 kg/m2 Overweight Increased incidence by 20%  30-34.9 kg/m2 Obese (Class I) Increased incidence by 68%  35-39.9 kg/m2 Severe obesity (Class II) Increased incidence by 136%  >40 kg/m2 Extreme obesity (Class III) Increased incidence by 254%   Patient's current BMI Ideal Body weight  Body mass index is 19.27 kg/m. Ideal body weight: 47.8  kg (105 lb 6.1 oz)   BMI Readings from Last 4 Encounters:  04/10/18 19.27 kg/m  03/19/18 19.27 kg/m  03/06/18 19.30 kg/m  03/01/18 18.52 kg/m   Wt Readings from Last 4 Encounters:  04/10/18 102 lb (46.3 kg)  03/19/18 102 lb (46.3 kg)  03/06/18 102 lb 2 oz (46.3 kg)  03/01/18 98 lb (44.5 kg)  Psych/Mental status: Alert, oriented x 3 (person, place, & time)       Eyes: PERLA Respiratory: No evidence of acute respiratory distress  Cervical Spine Area Exam  Skin & Axial Inspection: No masses, redness, edema, swelling, or associated skin lesions Alignment: Symmetrical Functional ROM: Unrestricted ROM      Stability: No instability  detected Muscle Tone/Strength: Functionally intact. No obvious neuro-muscular anomalies detected. Sensory (Neurological): Unimpaired Palpation: No palpable anomalies              Upper Extremity (UE) Exam    Side: Right upper extremity  Side: Left upper extremity  Skin & Extremity Inspection: Skin color, temperature, and hair growth are WNL. No peripheral edema or cyanosis. No masses, redness, swelling, asymmetry, or associated skin lesions. No contractures.  Skin & Extremity Inspection: Skin color, temperature, and hair growth are WNL. No peripheral edema or cyanosis. No masses, redness, swelling, asymmetry, or associated skin lesions. No contractures.  Functional ROM: Unrestricted ROM          Functional ROM: Unrestricted ROM          Muscle Tone/Strength: Functionally intact. No obvious neuro-muscular anomalies detected.  Muscle Tone/Strength: Functionally intact. No obvious neuro-muscular anomalies detected.  Sensory (Neurological): Unimpaired          Sensory (Neurological): Unimpaired          Palpation: No palpable anomalies              Palpation: No palpable anomalies              Provocative Test(s):  Phalen's test: deferred Tinel's test: deferred Apley's scratch test (touch opposite shoulder):  Action 1 (Across chest): deferred Action 2 (Overhead): deferred Action 3 (LB reach): deferred   Provocative Test(s):  Phalen's test: deferred Tinel's test: deferred Apley's scratch test (touch opposite shoulder):  Action 1 (Across chest): deferred Action 2 (Overhead): deferred Action 3 (LB reach): deferred    Thoracic Spine Area Exam  Skin & Axial Inspection:Significant thoracic kyphosis Alignment:Symmetrical Functional ZOX:[id number] ROM Stability:No instability detected Muscle Tone/Strength:Functionally intact. No obvious neuro-muscular anomalies detected. Sensory (Neurological):Articular pain pattern Muscle strength & Tone:No palpable anomalies  Lumbar Spine Area Exam   Skin & Axial Inspection:Well healed scar from previous spine surgery detected, thoracolumbar scoliosis present Alignment:Scoliosis detected, severe Functional WJX:[id number] ROM Stability:No instability detected Muscle Tone/Strength:Increased muscle tone over affected area Sensory (Neurological):Articular pain pattern Palpation:No palpable anomalies Provocative Tests: Lumbar Hyperextension and rotation test:Positivebilaterally for facet joint pain. Lumbar Lateral bending test:Positivedue to fusion restriction. Patrick's Maneuver:Positivefor bilateral S-I arthralgia  Gait & Posture Assessment  Ambulation:Limited Gait:Antalgic Posture:Difficulty standing up straight, due to pain, scoliosis   Lower Extremity Exam    Side: Right lower extremity  Side: Left lower extremity  Stability: No instability observed          Stability: No instability observed          Skin & Extremity Inspection: Skin color, temperature, and hair growth are WNL. No peripheral edema or cyanosis. No masses, redness, swelling, asymmetry, or associated skin lesions. No contractures.  Skin & Extremity Inspection: Skin color, temperature, and hair growth are  WNL. No peripheral edema or cyanosis. No masses, redness, swelling, asymmetry, or associated skin lesions. No contractures.  Functional ROM: Unrestricted ROM                  Functional ROM: Unrestricted ROM                  Muscle Tone/Strength: Functionally intact. No obvious neuro-muscular anomalies detected.  Muscle Tone/Strength: Functionally intact. No obvious neuro-muscular anomalies detected.  Sensory (Neurological): Unimpaired  Sensory (Neurological): Unimpaired  Palpation: No palpable anomalies  Palpation: No palpable anomalies   Assessment  Primary Diagnosis & Pertinent Problem List: The primary encounter diagnosis was Spondylosis without myelopathy or radiculopathy, lumbar region. Diagnoses of Thoracic  spondylosis without myelopathy, Chronic pain syndrome, Lumbar degenerative disc disease, and Scoliosis (and kyphoscoliosis), idiopathic were also pertinent to this visit.  Status Diagnosis  Responding Responding Responding 1. Spondylosis without myelopathy or radiculopathy, lumbar region   2. Thoracic spondylosis without myelopathy   3. Chronic pain syndrome   4. Lumbar degenerative disc disease   5. Scoliosis (and kyphoscoliosis), idiopathic      General Recommendations: The pain condition that the patient suffers from is best treated with a multidisciplinary approach that involves an increase in physical activity to prevent de-conditioning and worsening of the pain cycle, as well as psychological counseling (formal and/or informal) to address the co-morbid psychological affects of pain. Treatment will often involve judicious use of pain medications and interventional procedures to decrease the pain, allowing the patient to participate in the physical activity that will ultimately produce long-lasting pain reductions. The goal of the multidisciplinary approach is to return the patient to a higher level of overall function and to restore their ability to perform activities of daily living.  77 year old female with a history of chronic axial low back pain secondary to severe thoracolumbar scoliosis (history of lumbar spine surgery in 2013 at [facility]: Anterior lumbar interbody fusion from L3 to sacrum), lumbar degenerative disc disease, lumbar spinal stenosis with neurogenic claudication. Patient also endorses bilateral hip and groin pain as well as left shoulder pain. Of note patient's surgery in 2013 resulted in extended postoperative stay requiring a tracheostomy and feeding tube and prolonged rehab. Patient states that she was in the hospital for approximately 2.5 months after her lumbar spine surgery. Patient moved from this region to [place]. She was being seen by pain management provider there.  Patient has been receiving oxycodone 5 mg 3 times daily to 4 times daily as needed since 2005/2006. Patient's lumbar and thoracic x-rays revealed significant and severe scoliosis of the thoracolumbar spine with a caudal component convex to the left at 51 degrees. Patient also has thoracic facet and lumbar facet pathology that is diffuse. Patient has previously had thoracic facet medial branch nerve blocks in the past in [place] which she found effective.  Patient follows up today status post diagnostic T12, L1, L2 bilateral facet medial branch nerve blocks #2.  Patient endorses greater than 50% benefit for approximately 1 week after her procedure (75% for first 3 days).  This relief is still ongoing but has decreased to approx 25%.  Patient noted improvement in range of motion and lateral rotation.  We discussed T12, L1, L2 bilateral medial branch radiofrequency ablation given that the patient has experienced greater than 50% pain relief for approximately 5 to 7 days with each of her diagnostic blocks.  Risks and benefits of radiofrequency ablation were discussed and patient would like to proceed.  Plan: -Bilateral T12,  L1, L2 RFA under fluoroscopy with sedation -Continue Flexeril as needed, oxycodone 5 mill grams 3 times daily as needed, gabapentin 200 mg daily as prescribed.   Lab-work, procedure(s), and/or referral(s): Orders Placed This Encounter  Procedures  . Radiofrequency,Lumbar    Time Note: Greater than 50% of the 25 minute(s) of face-to-face time spent with Savannah Mcclure, was spent in counseling/coordination of care regarding: Savannah Mcclure's primary cause of pain, the treatment plan, treatment alternatives, the risks and possible complications of proposed treatment, going over the informed consent, the results, interpretation and significance of  her recent diagnostic interventional treatment(s), realistic expectations and the goals of pain management (increased in  functionality).  Provider-requested follow-up: Return in about 3 weeks (around 05/01/2018) for Procedure.  Future Appointments  Date Time Provider [place]  04/13/2018  9:45 AM Lucky Cowboy, Erskine Squibb, MD [facility] None  05/02/2018 10:00 AM Gillis Santa, MD [facility] None  09/06/2018 10:30 AM [name], DO [facility]  11/23/2018 11:00 AM [facility]FP NURSE HEALTH ADVISOR [facility]    Primary Care Physician: [name], DO Location: [facility] Note by: Gillis Santa, M.D Date: 04/10/2018; Time: 2:35 PM  Patient Instructions   Preparing for Procedure with Sedation Instructions: . Oral Intake: Do not eat or drink anything for at least 8 hours prior to your procedure. . Transportation: Public transportation is not allowed. Bring an adult driver. The driver must be physically present in our waiting room before any procedure can be started. Marland Kitchen Physical Assistance: Bring an adult capable of physically assisting you, in the event you need help. . Blood Pressure Medicine: Take your blood pressure medicine with a sip of water the morning of the procedure. . Insulin: Take only  of your normal insulin dose. . Preventing infections: Shower with an antibacterial soap the morning of your procedure. . Build-up your immune system: Take 1000 mg of Vitamin C with every meal (3 times a day) the day prior to your procedure. . Pregnancy: If you are pregnant, call and cancel the procedure. . Sickness: If you have a cold, fever, or any active infections, call and cancel the procedure. . Arrival: You must be in the facility at least 30 minutes prior to your scheduled procedure. . Children: Do not bring children with you. . Dress appropriately: Bring dark clothing that you would not mind if they get stained. . Valuables: Do not bring any jewelry or valuables. Procedure appointments are reserved for interventional treatments only. Marland Kitchen No Prescription Refills. . No medication  changes will be discussed during procedure appointments. No disability issues will be discussed.Radiofrequency Lesioning Radiofrequency lesioning is a procedure that is performed to relieve pain. The procedure is often used for back, neck, or arm pain. Radiofrequency lesioning involves the use of a machine that creates radio waves to make heat. During the procedure, the heat is applied to the nerve that carries the pain signal. The heat damages the nerve and interferes with the pain signal. Pain relief usually starts about 2 weeks after the procedure and lasts for 6 months to 1 year. Tell a health care provider about: Any allergies you have. All medicines you are taking, including vitamins, herbs, eye drops, creams, and over-the-counter medicines. Any problems you or family members have had with anesthetic medicines. Any blood disorders you have. Any surgeries you have had. Any medical conditions you have. Whether you are pregnant or may be pregnant. What are the risks? Generally, this is a safe procedure. However,  problems may occur, including: Pain or soreness at the injection site. Infection at the injection site. Damage to nerves or blood vessels.  What happens before the procedure? Ask your health care provider about: Changing or stopping your regular medicines. This is especially important if you are taking diabetes medicines or blood thinners. Taking medicines such as aspirin and ibuprofen. These medicines can thin your blood. Do not take these medicines before your procedure if your health care provider instructs you not to. Follow instructions from your health care provider about eating or drinking restrictions. Plan to have someone take you home after the procedure. If you go home right after the procedure, plan to have someone with you for 24 hours. What happens during the procedure? You will be given one or more of the following: A medicine to help you relax (sedative). A  medicine to numb the area (local anesthetic). You will be awake during the procedure. You will need to be able to talk with the health care provider during the procedure. With the help of a type of X-ray (fluoroscopy), the health care provider will insert a radiofrequency needle into the area to be treated. Next, a wire that carries the radio waves (electrode) will be put through the radiofrequency needle. An electrical pulse will be sent through the electrode to verify the correct nerve. You will feel a tingling sensation, and you may have muscle twitching. Then, the tissue that is around the needle tip will be heated by an electric current that is passed using the radiofrequency machine. This will numb the nerves. A bandage (dressing) will be put on the insertion area after the procedure is done. The procedure may vary among health care providers and hospitals. What happens after the procedure? Your blood pressure, heart rate, breathing rate, and blood oxygen level will be monitored often until the medicines you were given have worn off. Return to your normal activities as directed by your health care provider. This information is not intended to replace advice given to you by your health care provider. Make sure you discuss any questions you have with your health care provider. Document Released: 03/30/2011 Document Revised: 01/07/2016 Document Reviewed: 09/08/2014 Elsevier Interactive Patient Education  2018 [place]  What are the risk, side effects and possible complications? Generally speaking, most procedures are safe.  However, with any procedure there are risks, side effects, and the possibility of complications.  The risks and complications are dependent upon the sites that are lesioned, or the type of nerve block to be performed.  The closer the procedure is to the spine, the more serious the risks are.  Great care is taken when placing the radio  frequency needles, block needles or lesioning probes, but sometimes complications can occur. Infection: Any time there is an injection through the skin, there is a risk of infection.  This is why sterile conditions are used for these blocks.  There are four possible types of infection. Localized skin infection. Central Nervous System Infection-This can be in the form of Meningitis, which can be deadly. Epidural Infections-This can be in the form of an epidural abscess, which can cause pressure inside of the spine, causing compression of the spinal cord with subsequent paralysis. This would require an emergency surgery to decompress, and there are no guarantees that the patient would recover from the paralysis. Discitis-This is an infection of the intervertebral discs.  It occurs in about 1% of discography procedures.  It is difficult to  treat and it may lead to surgery.        2. Pain: the needles have to go through skin and soft tissues, will cause soreness.       3. Damage to internal structures:  The nerves to be lesioned may be near blood vessels or    other nerves which can be potentially damaged.       4. Bleeding: Bleeding is more common if the patient is taking blood thinners such as  aspirin, Coumadin, Ticiid, Plavix, etc., or if he/she have some genetic predisposition  such as hemophilia. Bleeding into the spinal canal can cause compression of the spinal  cord with subsequent paralysis.  This would require an emergency surgery to  decompress and there are no guarantees that the patient would recover from the  paralysis.       5. Pneumothorax:  Puncturing of a lung is a possibility, every time a needle is introduced in  the area of the chest or upper back.  Pneumothorax refers to free air around the  collapsed lung(s), inside of the thoracic cavity (chest cavity).  Another two possible  complications related to a similar event would include: Hemothorax and Chylothorax.   These are variations of  the Pneumothorax, where instead of air around the collapsed  lung(s), you may have blood or chyle, respectively.       6. Spinal headaches: They may occur with any procedures in the area of the spine.       7. Persistent CSF (Cerebro-Spinal Fluid) leakage: This is a rare problem, but may occur  with prolonged intrathecal or epidural catheters either due to the formation of a fistulous  track or a dural tear.       8. Nerve damage: By working so close to the spinal cord, there is always a possibility of  nerve damage, which could be as serious as a permanent spinal cord injury with  paralysis.       9. Death:  Although rare, severe deadly allergic reactions known as "Anaphylactic  reaction" can occur to any of the medications used.      10. Worsening of the symptoms:  We can always make thing worse.  What are the chances of something like this happening? Chances of any of this occuring are extremely low.  By statistics, you have more of a chance of getting killed in a motor vehicle accident: while driving to the hospital than any of the above occurring .  Nevertheless, you should be aware that they are possibilities.  In general, it is similar to taking a shower.  Everybody knows that you can slip, hit your head and get killed.  Does that mean that you should not shower again?  Nevertheless always keep in mind that statistics do not mean anything if you happen to be on the wrong side of them.  Even if a procedure has a 1 (one) in a 1,000,000 (million) chance of going wrong, it you happen to be that one..Also, keep in mind that by statistics, you have more of a chance of having something go wrong when taking medications.  Who should not have this procedure? If you are on a blood thinning medication (e.g. Coumadin, Plavix, see list of "Blood Thinners"), or if you have an active infection going on, you should not have the procedure.  If you are taking any blood thinners, please inform your physician.  How  should I prepare for this procedure? Do not eat or drink  anything at least six hours prior to the procedure. Bring a driver with you .  It cannot be a taxi. Come accompanied by an adult that can drive you back, and that is strong enough to help you if your legs get weak or numb from the local anesthetic. Take all of your medicines the morning of the procedure with just enough water to swallow them. If you have diabetes, make sure that you are scheduled to have your procedure done first thing in the morning, whenever possible. If you have diabetes, take only half of your insulin dose and notify our nurse that you have done so as soon as you arrive at the clinic. If you are diabetic, but only take blood sugar pills (oral hypoglycemic), then do not take them on the morning of your procedure.  You may take them after you have had the procedure. Do not take aspirin or any aspirin-containing medications, at least eleven (11) days prior to the procedure.  They may prolong bleeding. Wear loose fitting clothing that may be easy to take off and that you would not mind if it got stained with Betadine or blood. Do not wear any jewelry or perfume Remove any nail coloring.  It will interfere with some of our monitoring equipment.  NOTE: Remember that this is not meant to be interpreted as a complete list of all possible complications.  Unforeseen problems may occur.  BLOOD THINNERS The following drugs contain aspirin or other products, which can cause increased bleeding during surgery and should not be taken for 2 weeks prior to and 1 week after surgery.  If you should need take something for relief of minor pain, you may take acetaminophen which is found in Tylenol,m Datril, Anacin-3 and Panadol. It is not blood thinner. The products listed below are.  Do not take any of the products listed below in addition to any listed on your instruction sheet.  A.P.C or A.P.C with Codeine Codeine Phosphate Capsules #3  Ibuprofen Ridaura  ABC compound Congesprin Imuran rimadil  Advil Cope Indocin Robaxisal  Alka-Seltzer Effervescent Pain Reliever and Antacid Coricidin or Coricidin-D  Indomethacin Rufen  Alka-Seltzer plus Cold Medicine Cosprin Ketoprofen S-A-C Tablets  Anacin Analgesic Tablets or Capsules Coumadin Korlgesic Salflex  Anacin Extra Strength Analgesic tablets or capsules CP-2 Tablets Lanoril Salicylate  Anaprox Cuprimine Capsules Levenox Salocol  Anexsia-D Dalteparin Magan Salsalate  Anodynos Darvon compound Magnesium Salicylate Sine-off  Ansaid Dasin Capsules Magsal Sodium Salicylate  Anturane Depen Capsules Marnal Soma  APF Arthritis pain formula Dewitt's Pills Measurin Stanback  Argesic Dia-Gesic Meclofenamic Sulfinpyrazone  Arthritis Bayer Timed Release Aspirin Diclofenac Meclomen Sulindac  Arthritis pain formula Anacin Dicumarol Medipren Supac  Analgesic (Safety coated) Arthralgen Diffunasal Mefanamic Suprofen  Arthritis Strength Bufferin Dihydrocodeine Mepro Compound Suprol  Arthropan liquid Dopirydamole Methcarbomol with Aspirin Synalgos  ASA tablets/Enseals Disalcid Micrainin Tagament  Ascriptin Doan's Midol Talwin  Ascriptin A/D Dolene Mobidin Tanderil  Ascriptin Extra Strength Dolobid Moblgesic Ticlid  Ascriptin with Codeine Doloprin or Doloprin with Codeine Momentum Tolectin  Asperbuf Duoprin Mono-gesic Trendar  Aspergum Duradyne Motrin or Motrin IB Triminicin  Aspirin plain, buffered or enteric coated Durasal Myochrisine Trigesic  Aspirin Suppositories Easprin Nalfon Trillsate  Aspirin with Codeine Ecotrin Regular or Extra Strength Naprosyn Uracel  Atromid-S Efficin Naproxen Ursinus  Auranofin Capsules Elmiron Neocylate Vanquish  Axotal Emagrin Norgesic Verin  Azathioprine Empirin or Empirin with Codeine Normiflo Vitamin E  Azolid Emprazil Nuprin Voltaren  Bayer Aspirin plain, buffered or children's or timed BC Tablets  or powders Encaprin Orgaran Warfarin Sodium   Buff-a-Comp Enoxaparin Orudis Zorpin  Buff-a-Comp with Codeine Equegesic Os-Cal-Gesic   Buffaprin Excedrin plain, buffered or Extra Strength Oxalid   Bufferin Arthritis Strength Feldene Oxphenbutazone   Bufferin plain or Extra Strength Feldene Capsules Oxycodone with Aspirin   Bufferin with Codeine Fenoprofen Fenoprofen Pabalate or Pabalate-SF   Buffets II Flogesic Panagesic   Buffinol plain or Extra Strength Florinal or Florinal with Codeine Panwarfarin   Buf-Tabs Flurbiprofen Penicillamine   Butalbital Compound Four-way cold tablets Penicillin   Butazolidin Fragmin Pepto-Bismol   Carbenicillin Geminisyn Percodan   Carna Arthritis Reliever Geopen Persantine   Carprofen Gold's salt Persistin   Chloramphenicol Goody's Phenylbutazone   Chloromycetin Haltrain Piroxlcam   Clmetidine heparin Plaquenil   Cllnoril Hyco-pap Ponstel   Clofibrate Hydroxy chloroquine Propoxyphen         Before stopping any of these medications, be sure to consult the physician who ordered them.  Some, such as Coumadin (Warfarin) are ordered to prevent or treat serious conditions such as "deep thrombosis", "pumonary embolisms", and other heart problems.  The amount of time that you may need off of the medication may also vary with the medication and the reason for which you were taking it.  If you are taking any of these medications, please make sure you notify your pain physician before you undergo any procedures.         Marland Kitchen

## 2018-04-13 ENCOUNTER — Encounter (INDEPENDENT_AMBULATORY_CARE_PROVIDER_SITE_OTHER): Payer: Self-pay | Admitting: Vascular Surgery

## 2018-04-13 ENCOUNTER — Ambulatory Visit (INDEPENDENT_AMBULATORY_CARE_PROVIDER_SITE_OTHER): Payer: Medicare Other | Admitting: Vascular Surgery

## 2018-04-13 VITALS — BP 146/80 | HR 103 | Resp 16 | Ht 61.0 in | Wt 108.0 lb

## 2018-04-13 DIAGNOSIS — I6523 Occlusion and stenosis of bilateral carotid arteries: Secondary | ICD-10-CM

## 2018-04-13 DIAGNOSIS — I70219 Atherosclerosis of native arteries of extremities with intermittent claudication, unspecified extremity: Secondary | ICD-10-CM | POA: Insufficient documentation

## 2018-04-13 DIAGNOSIS — J410 Simple chronic bronchitis: Secondary | ICD-10-CM | POA: Diagnosis not present

## 2018-04-13 DIAGNOSIS — I6529 Occlusion and stenosis of unspecified carotid artery: Secondary | ICD-10-CM | POA: Insufficient documentation

## 2018-04-13 DIAGNOSIS — I10 Essential (primary) hypertension: Secondary | ICD-10-CM

## 2018-04-13 DIAGNOSIS — I70213 Atherosclerosis of native arteries of extremities with intermittent claudication, bilateral legs: Secondary | ICD-10-CM

## 2018-04-13 NOTE — Progress Notes (Signed)
Patient ID: Vennie Homans, female   DOB: 1941/08/10, 77 y.o.   MRN: [id number]  Chief Complaint  Patient presents with  . New Patient (Initial Visit)    ref Wynetta Emery for PAD    HPI Savannah Mcclure is a 77 y.o. female.  I am asked to see the patient by Dr. Wynetta Emery for evaluation of PAD.  The patient reports having a stent put in her right leg 10 to 15 years ago for short distance claudication.  At the time, that improved her symptoms.  She is complaining of more claudication symptoms at this point.  She says she cannot walk from one side of her house to the other without having to stop because of leg pain.  She does have issues with her back but says this pain is different.  She does not have open ulcerations or infection.  No fevers or chills. 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  She has known carotid artery disease which has been subcritical up to this point.  This was last checked towards the end of last year when she was still in [place].   Past Medical History:  Diagnosis Date  . 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 Blind right eye   . Cardiac arrest ([place])   . Cataract    right eye  . COPD (chronic obstructive pulmonary disease) ([place])   . Hypertension   . Scoliosis   . Sepsis ([place])   . Spinal stenosis     Past Surgical History:  Procedure Laterality Date  . ABDOMINAL HYSTERECTOMY     total  . BACK SURGERY    . BREAST LUMPECTOMY Left   . right eye surgery    . WISDOM TOOTH EXTRACTION      Family History  Problem Relation Age of Onset  . Intracerebral hemorrhage Mother   . Hypertension Mother   . Heart failure Father   . Schizophrenia Daughter   . Rheum arthritis Maternal Grandmother   . Kidney disease Maternal Grandmother   . Heart disease Paternal Grandmother   . Stroke Paternal Grandmother   . Stroke Paternal Grandfather   .  Heart disease Paternal Grandfather      Social History Social History   Tobacco Use  . Smoking status: Current Every Day Smoker    Packs/day: 0.50  . Smokeless tobacco: Former Network engineer Use Topics  . Alcohol use: Yes    Frequency: Never    Comment: wine on occasion  . Drug use: No     Allergies  Allergen Reactions  . Other Itching    Cat Gut sutures   . Amlodipine Hives  . Azithromycin Hives  . Doxycycline Diarrhea  . Erythromycin Diarrhea  . Hydrocodone Nausea Only  . Lactose Intolerance (Gi)   . Minocycline Diarrhea  . Nsaids Other (See Comments)    Internal Bleeding  . Relafen [Nabumetone] Hives  . Sulfa Antibiotics Hives  . Tetracyclines & Related Diarrhea    Current Outpatient Medications  Medication Sig Dispense Refill  . B-COMPLEX-C PO Take 1 tablet by mouth daily.    . benazepril (LOTENSIN) 20 MG tablet Take 1 tablet (20 mg total) by mouth daily. 90 tablet 3  . cilostazol (PLETAL) 100 MG tablet Take 1 tablet (100 mg total) by mouth 2 (two) times daily. 180 tablet  1  . cyclobenzaprine (FLEXERIL) 5 MG tablet Take 1 tablet (5 mg total) by mouth 2 (two) times daily as needed for muscle spasms. 60 tablet 2  . diltiazem (CARDIZEM) 30 MG tablet Take 1 tablet (30 mg total) by mouth 3 (three) times daily as needed (Tachycardia HR > 130.). 180 tablet 1  . gabapentin (NEURONTIN) 300 MG capsule Take 1 capsule (300 mg total) by mouth 2 (two) times daily. 60 capsule 3  . hydrOXYzine (ATARAX/VISTARIL) 25 MG tablet Take 1 tablet (25 mg total) by mouth 3 (three) times daily as needed. 270 tablet 3  . ipratropium-albuterol (DUONEB) 0.5-2.5 (3) MG/3ML SOLN Take 3 mLs by nebulization every 6 (six) hours as needed. 360 mL 6  . lidocaine (LIDODERM) 5 % Place 1 patch onto the skin daily. Remove & Discard patch within 12 hours or as directed by MD 60 patch 6  . loperamide (ANTI-DIARRHEAL) 2 MG tablet Take 2 mg by mouth 4 (four) times daily as needed for diarrhea or loose stools.     . montelukast (SINGULAIR) 10 MG tablet Take 1 tablet (10 mg total) by mouth at bedtime. 90 tablet 3  . Multiple Vitamins-Minerals (MULTIVITAMIN WITH MINERALS) tablet Take 1 tablet by mouth daily.    Marland Kitchen oxyCODONE (OXY IR/ROXICODONE) 5 MG immediate release tablet Take 1 tablet (5 mg total) by mouth 3 (three) times daily as needed for severe pain. For chronic pain To last for 30 days from fill date 90 tablet 0  . tiotropium (SPIRIVA HANDIHALER) 18 MCG inhalation capsule Place 1 capsule (18 mcg total) into inhaler and inhale daily. 90 capsule 3  . triamcinolone ointment (KENALOG) 0.5 % Apply 1 application topically 2 (two) times daily. 30 g 0  . UNABLE TO FIND Take 1 tablet by mouth 2 (two) times daily. Cardio FX    . UNABLE TO FIND Take 1 tablet by mouth daily. Cinnergy    . UNABLE TO FIND Take 2 tablets by mouth daily. Joint FX     Current Facility-Administered Medications  Medication Dose Route Frequency Provider Last Rate Last Dose  . albuterol (PROVENTIL) (2.5 MG/3ML) 0.083% nebulizer solution 2.5 mg  2.5 mg Nebulization Once Johnson, Megan P, DO          REVIEW OF SYSTEMS (Negative unless checked)  Constitutional: [] Weight loss  [] Fever  [] Chills Cardiac: [] Chest pain   [] Chest pressure   [] Palpitations   [] Shortness of breath when laying flat   [] Shortness of breath at rest   [] Shortness of breath with exertion. Vascular:  [x] Pain in legs with walking   [] Pain in legs at rest   [] Pain in legs when laying flat   [x] Claudication   [] Pain in feet when walking  [] Pain in feet at rest  [] Pain in feet when laying flat   [] History of DVT   [] Phlebitis   [] Swelling in legs   [] Varicose veins   [] Non-healing ulcers Pulmonary:   [] Uses home oxygen   [] Productive cough   [] Hemoptysis   [] Wheeze  [] COPD   [] Asthma Neurologic:  [] Dizziness  [] Blackouts   [] Seizures   [] History of stroke   [] History of TIA  [] Aphasia   [x] Temporary blindness   [] Dysphagia   [] Weakness or numbness in arms   [] Weakness  or numbness in legs Musculoskeletal:  [] Arthritis   [] Joint swelling   [x] Joint pain   [x] Low back pain Hematologic:  [] Easy bruising  [] Easy bleeding   [] Hypercoagulable state   [] Anemic  [] Hepatitis Gastrointestinal:  [] Blood in stool   []   Vomiting blood  [] Gastroesophageal reflux/heartburn   [] Abdominal pain Genitourinary:  [] Chronic kidney disease   [] Difficult urination  [] Frequent urination  [] Burning with urination   [] Hematuria Skin:  [] Rashes   [] Ulcers   [] Wounds Psychological:  [] History of anxiety   []  History of major depression.    Physical Exam BP (!) 146/80 (BP Location: Right Arm)   Pulse (!) 103   Resp 16   Ht 5\' 1"  (1.549 m)   Wt 108 lb (49 kg)   BMI 20.41 kg/m  Gen:  WD/WN, NAD. Appears younger than stated age. Head: [place]/AT, No temporalis wasting.  Ear/Nose/Throat: Hearing grossly intact, nares w/o erythema or drainage, oropharynx w/o Erythema/Exudate Eyes: Conjunctiva clear, sclera non-icteric. Right eye with vision loss Neck: trachea midline.  Bilateral carotid bruits, louder on the left Pulmonary:  Good air movement, clear to auscultation bilaterally.  Cardiac: RRR, no JVD Vascular:  Vessel Right Left  Radial Palpable Palpable                          PT Not Palpable Not Palpable  DP 1+ Palpable Trace Palpable   Gastrointestinal: soft, non-tender/non-distended. Musculoskeletal: M/S 5/5 throughout.  No edema. Somewhat sluggish capillary refill in both feet. Neurologic: Sensation grossly intact in extremities.  Symmetrical.  Speech is fluent. Motor exam as listed above. Psychiatric: Judgment intact, Mood & affect appropriate for pt's clinical situation. Dermatologic: No rashes or ulcers noted.  No cellulitis or open wounds. Lymph : No Cervical, Axillary, or Inguinal lymphadenopathy.   Radiology Dg C-arm 1-60 Min-no Report  Result Date: 03/19/2018 Fluoroscopy was utilized by the requesting physician.  No radiographic interpretation.    Labs Recent  Results (from the past 2160 hour(s))  ToxASSURE Select 13 (MW), Urine     Status: None   Collection Time: 01/23/18  1:00 PM  Result Value Ref Range   Summary FINAL     Comment: ==================================================================== TOXASSURE SELECT 13 (MW) ==================================================================== Test                             Result       Flag       Units Drug Absent but Declared for Prescription Verification   Oxycodone                      Not Detected UNEXPECTED ng/mg creat ==================================================================== Test                      Result    Flag   Units      Ref Range   Creatinine              163              mg/dL      >=20 ==================================================================== Declared Medications:  The flagging and interpretation on this report are based on the  following declared medications.  Unexpected results may arise from  inaccuracies in the declared medications.  **Note: The testing scope of this panel includes these medications:  Oxycodone  **Note: The testing scope of this panel does not include following  reported medications:  A lbuterol (Ipratropium-Albuterol)  Benazepril  Cilostazol  Diltiazem  Gabapentin  Hydroxyzine  Ipratropium (Ipratropium-Albuterol)  Lidocaine  Loperamide  Multivitamin  Supplement  Tiotropium  Vitamin B ==================================================================== For clinical consultation, please call [phone]. ====================================================================     Assessment/Plan:  HTN (hypertension)  blood pressure control important in reducing the progression of atherosclerotic disease. On appropriate oral medications.   COPD (chronic obstructive pulmonary disease) (HCC) Continue pulmonary medications and aerosols as already ordered, these medications have been reviewed and there are no changes at  this time.    Atherosclerosis of native arteries of extremity with intermittent claudication (HCC) Recommend:  Patient should undergo non-invasive studies of the lower extremity ASAP because there has been a significant deterioration in the patient's lower extremity symptoms.  The patient states they are having increased pain and a marked decrease in the distance that they can walk.  The risks and benefits as well as the alternatives were discussed in detail with the patient.  All questions were answered.  Patient agrees to proceed and understands this could be a prelude to angiography and intervention.  The patient will follow up with me in the office to review the studies.   Carotid stenosis The patient has a known history of at least moderate carotid artery stenosis that has not been checked in nearly a year.  I would recommend carotid duplex in the near future at her convenience.  I discussed the pathophysiology and natural history of carotid disease.  I will see her back following the studies as well as her PAD studies.      Leotis Pain 04/13/2018, 11:53 AM   This note was created with Dragon medical transcription system.  Any errors from dictation are unintentional.

## 2018-04-13 NOTE — Assessment & Plan Note (Signed)
The patient has a known history of at least moderate carotid artery stenosis that has not been checked in nearly a year.  I would recommend carotid duplex in the near future at her convenience.  I discussed the pathophysiology and natural history of carotid disease.  I will see her back following the studies as well as her PAD studies.

## 2018-04-13 NOTE — Assessment & Plan Note (Signed)
Recommend:  Patient should undergo non-invasive studies of the lower extremity ASAP because there has been a significant deterioration in the patient's lower extremity symptoms.  The patient states they are having increased pain and a marked decrease in the distance that they can walk.  The risks and benefits as well as the alternatives were discussed in detail with the patient.  All questions were answered.  Patient agrees to proceed and understands this could be a prelude to angiography and intervention.  The patient will follow up with me in the office to review the studies.

## 2018-04-13 NOTE — Assessment & Plan Note (Signed)
blood pressure control important in reducing the progression of atherosclerotic disease. On appropriate oral medications.  

## 2018-04-13 NOTE — Assessment & Plan Note (Signed)
Continue pulmonary medications and aerosols as already ordered, these medications have been reviewed and there are no changes at this time.   

## 2018-04-13 NOTE — Patient Instructions (Signed)

## 2018-04-23 ENCOUNTER — Ambulatory Visit (INDEPENDENT_AMBULATORY_CARE_PROVIDER_SITE_OTHER): Payer: Medicare Other

## 2018-04-23 DIAGNOSIS — Z23 Encounter for immunization: Secondary | ICD-10-CM | POA: Diagnosis not present

## 2018-05-02 ENCOUNTER — Ambulatory Visit: Payer: Medicare Other | Admitting: Student in an Organized Health Care Education/Training Program

## 2018-05-07 ENCOUNTER — Ambulatory Visit (HOSPITAL_BASED_OUTPATIENT_CLINIC_OR_DEPARTMENT_OTHER): Payer: Medicare Other | Admitting: Student in an Organized Health Care Education/Training Program

## 2018-05-07 ENCOUNTER — Ambulatory Visit
Admission: RE | Admit: 2018-05-07 | Discharge: 2018-05-07 | Disposition: A | Discharge status: Home / Self Care | Payer: Medicare Other | Source: Ambulatory Visit | Attending: Student in an Organized Health Care Education/Training Program | Admitting: Student in an Organized Health Care Education/Training Program

## 2018-05-07 ENCOUNTER — Encounter: Payer: Self-pay | Admitting: Student in an Organized Health Care Education/Training Program

## 2018-05-07 ENCOUNTER — Other Ambulatory Visit: Payer: Self-pay

## 2018-05-07 VITALS — BP 157/80 | HR 119 | Temp 98.0°F | Resp 16 | Ht 61.0 in | Wt 112.0 lb

## 2018-05-07 DIAGNOSIS — M47816 Spondylosis without myelopathy or radiculopathy, lumbar region: Secondary | ICD-10-CM | POA: Insufficient documentation

## 2018-05-07 DIAGNOSIS — G894 Chronic pain syndrome: Secondary | ICD-10-CM | POA: Insufficient documentation

## 2018-05-07 DIAGNOSIS — M47814 Spondylosis without myelopathy or radiculopathy, thoracic region: Secondary | ICD-10-CM | POA: Diagnosis not present

## 2018-05-07 MED ORDER — LIDOCAINE HCL 2 % IJ SOLN
INTRAMUSCULAR | Status: AC
  Filled 2018-05-07: qty 20

## 2018-05-07 MED ORDER — FENTANYL CITRATE (PF) 100 MCG/2ML IJ SOLN
INTRAMUSCULAR | Status: AC
  Filled 2018-05-07: qty 2

## 2018-05-07 MED ORDER — ROPIVACAINE HCL 2 MG/ML IJ SOLN
INTRAMUSCULAR | Status: AC
  Filled 2018-05-07: qty 10

## 2018-05-07 MED ORDER — DEXAMETHASONE SODIUM PHOSPHATE 10 MG/ML IJ SOLN
INTRAMUSCULAR | Status: AC
  Filled 2018-05-07: qty 1

## 2018-05-07 MED ORDER — ROPIVACAINE HCL 2 MG/ML IJ SOLN
10.0000 mL | Freq: Once | INTRAMUSCULAR | Status: AC
  Administered 2018-05-07: 10 mL

## 2018-05-07 MED ORDER — LACTATED RINGERS IV SOLN
1000.0000 mL | Freq: Once | INTRAVENOUS | Status: AC
  Administered 2018-05-07: 1000 mL via INTRAVENOUS

## 2018-05-07 MED ORDER — LIDOCAINE HCL 2 % IJ SOLN
20.0000 mL | Freq: Once | INTRAMUSCULAR | Status: AC
  Administered 2018-05-07: 400 mg

## 2018-05-07 MED ORDER — FENTANYL CITRATE (PF) 100 MCG/2ML IJ SOLN
25.0000 ug | INTRAMUSCULAR | Status: DC | PRN
  Administered 2018-05-07: 50 ug via INTRAVENOUS

## 2018-05-07 MED ORDER — DEXAMETHASONE SODIUM PHOSPHATE 10 MG/ML IJ SOLN
10.0000 mg | Freq: Once | INTRAMUSCULAR | Status: AC
  Administered 2018-05-07: 10 mg

## 2018-05-07 NOTE — Progress Notes (Signed)
Safety precautions to be maintained throughout the outpatient stay will include: orient to surroundings, keep bed in low position, maintain call bell within reach at all times, provide assistance with transfer out of bed and ambulation.  

## 2018-05-07 NOTE — Patient Instructions (Signed)

## 2018-05-07 NOTE — Progress Notes (Signed)
Patient's Name: Savannah Mcclure  MRN: [id number]  Referring Provider: [name], DO  DOB: June 12, 1941  PCP: [name], DO  DOS: 05/07/2018  Note by: Gillis Santa, MD  Service setting: Ambulatory outpatient  Specialty: Interventional Pain Management  Patient type: Established  Location: [facility] (AMB) Pain Management Facility  Visit type: Interventional Procedure   Primary Reason for Visit: Interventional Pain Management Treatment. CC: Procedure  Procedure:          Anesthesia, Analgesia, Anxiolysis:  Type: Therapeutic Thoracic Medial Nerve Radiofrequency Ablation  #1  Region: Posterior   Level: T11-12, T12-L1 and  L1-L2 Medial Branch Level(s) Laterality: Bilateral Paraspinal  Type: Moderate (Conscious) Sedation combined with Local Anesthesia Indication(s): Analgesia and Anxiety Route: Intravenous (IV) IV Access: Secured Sedation: Meaningful verbal contact was maintained at all times during the procedure  Local Anesthetic: Lidocaine 1-2%   Position: Prone   Indications: 1. Spondylosis without myelopathy or radiculopathy, lumbar region   2. Thoracic spondylosis without myelopathy   3. Chronic pain syndrome    Savannah Mcclure has been dealing with the above chronic pain for longer than three months and has either failed to respond, was unable to tolerate, or simply did not get enough benefit from other more conservative therapies including, but not limited to: 1. Over-the-counter medications 2. Anti-inflammatory medications 3. Muscle relaxants 4. Membrane stabilizers 5. Opioids 6. Physical therapy and/or chiropractic manipulation 7. Modalities (Heat, ice, etc.) 8. Invasive techniques such as nerve blocks. Savannah Mcclure has attained more than 50% relief of the pain from a series of diagnostic injections conducted in separate occasions.  Pain Score: Pre-procedure: 2 /10 Post-procedure: 0-No pain/10  Pre-op Assessment:  Savannah Mcclure is a 77 y.o. (year  old), female patient, seen today for interventional treatment. She  has a past surgical history that includes right eye surgery; Wisdom tooth extraction; Back surgery; Abdominal hysterectomy; and Breast lumpectomy (Left). Savannah Mcclure has a current medication list which includes the following prescription(s): b complex-c, benazepril, cilostazol, cyclobenzaprine, diltiazem, gabapentin, hydroxyzine, ipratropium-albuterol, lidocaine, loperamide, montelukast, multivitamin with minerals, oxycodone, tiotropium, triamcinolone ointment, UNABLE TO FIND, UNABLE TO FIND, and UNABLE TO FIND, and the following Facility-Administered Medications: albuterol and fentanyl. Her primarily concern today is the Procedure  Initial Vital Signs:  Pulse/HCG Rate: 78ECG Heart Rate: (!) 116 Temp: 98 F (36.7 C) Resp: 18 BP: (!) 153/75 SpO2: 99 %  BMI: Estimated body mass index is 21.16 kg/m as calculated from the following:   Height as of this encounter: 5\' 1"  (1.549 m).   Weight as of this encounter: 112 lb (50.8 kg).  Risk Assessment: Allergies: Reviewed. She is allergic to other; amlodipine; azithromycin; doxycycline; erythromycin; hydrocodone; lactose intolerance (gi); minocycline; nsaids; relafen [nabumetone]; sulfa antibiotics; and tetracyclines & related.  Allergy Precautions: None required Coagulopathies: Reviewed. None identified.  Blood-thinner therapy: None at this time Active Infection(s): Reviewed. None identified. Savannah Mcclure is afebrile  Site Confirmation: Savannah Mcclure was asked to confirm the procedure and laterality before marking the site Procedure checklist: Completed Consent: Before the procedure and under the influence of no sedative(s), amnesic(s), or anxiolytics, the patient was informed of the treatment options, risks and possible complications. To fulfill our ethical and legal obligations, as recommended by the American Medical Association's Code of Ethics, I have informed the  patient of my clinical impression; the nature and purpose of the treatment or procedure; the risks, benefits, and possible complications of the intervention; the alternatives, including doing nothing; the  risk(s) and benefit(s) of the alternative treatment(s) or procedure(s); and the risk(s) and benefit(s) of doing nothing. The patient was provided information about the general risks and possible complications associated with the procedure. These may include, but are not limited to: failure to achieve desired goals, infection, bleeding, organ or nerve damage, allergic reactions, paralysis, and death. In addition, the patient was informed of those risks and complications associated to Spine-related procedures, such as failure to decrease pain; infection (i.e.: Meningitis, epidural or intraspinal abscess); bleeding (i.e.: epidural hematoma, subarachnoid hemorrhage, or any other type of intraspinal or peri-dural bleeding); organ or nerve damage (i.e.: Any type of peripheral nerve, nerve root, or spinal cord injury) with subsequent damage to sensory, motor, and/or autonomic systems, resulting in permanent pain, numbness, and/or weakness of one or several areas of the body; allergic reactions; (i.e.: anaphylactic reaction); and/or death. Furthermore, the patient was informed of those risks and complications associated with the medications. These include, but are not limited to: allergic reactions (i.e.: anaphylactic or anaphylactoid reaction(s)); adrenal axis suppression; blood sugar elevation that in diabetics may result in ketoacidosis or comma; water retention that in patients with history of congestive heart failure may result in shortness of breath, pulmonary edema, and decompensation with resultant heart failure; weight gain; swelling or edema; medication-induced neural toxicity; particulate matter embolism and blood vessel occlusion with resultant organ, and/or nervous system infarction; and/or aseptic necrosis  of one or more joints. Finally, the patient was informed that Medicine is not an exact science; therefore, there is also the possibility of unforeseen or unpredictable risks and/or possible complications that may result in a catastrophic outcome. The patient indicated having understood very clearly. We have given the patient no guarantees and we have made no promises. Enough time was given to the patient to ask questions, all of which were answered to the patient's satisfaction. Savannah Mcclure has indicated that she wanted to continue with the procedure. Attestation: I, the ordering provider, attest that I have discussed with the patient the benefits, risks, side-effects, alternatives, likelihood of achieving goals, and potential problems during recovery for the procedure that I have provided informed consent. Date  Time: 05/07/2018  9:44 AM  Pre-Procedure Preparation:  Monitoring: As per clinic protocol. Respiration, ETCO2, SpO2, BP, heart rate and rhythm monitor placed and checked for adequate function Safety Precautions: Patient was assessed for positional comfort and pressure points before starting the procedure. Time-out: I initiated and conducted the "Time-out" before starting the procedure, as per protocol. The patient was asked to participate by confirming the accuracy of the "Time Out" information. Verification of the correct person, site, and procedure were performed and confirmed by me, the nursing staff, and the patient. "Time-out" conducted as per Joint Commission's Universal Protocol (UP.01.01.01). Time: 1053  Description of Procedure:          Target Area: For thoracic and lumbar Facet block(s), the target is the groove formed by the junction of the transverse process and superior articular process.  Approach: Paraspinal approach. Area Prepped: Entire Upper Back Area Prepping solution: Hibiclens (4.0% Chlorhexidine gluconate solution) Safety Precautions: Aspiration looking for blood  return was conducted prior to all injections. At no point did we inject any substances, as a needle was being advanced. No attempts were made at seeking any paresthesias. Safe injection practices and needle disposal techniques used. Medications properly checked for expiration dates. SDV (single dose vial) medications used. Description of the Procedure: Protocol guidelines were followed. The patient was placed in position over the  procedure table. The target area was identified and the area prepped in the usual manner. The skin and muscle were infiltrated with local anesthetic. Appropriate amount of time allowed to pass for local anesthetics to take effect. Radiofrequency needles were introduced to the target area using fluoroscopic guidance. Using the NeuroTherm NT1100 Radiofrequency Generator, sensory stimulation using 50 Hz was used to locate & identify the nerve, making sure that the needle was positioned such that there was no sensory stimulation below 0.3 V or above 0.7 V. Stimulation using 2 Hz was used to evaluate the motor component. Care was taken not to lesion any nerves that demonstrated motor stimulation of the lower extremities at an output of less than 2.5 times that of the sensory threshold, or a maximum of 2.0 V. Once satisfactory placement of the needles was achieved, the numbing solution was slowly injected after negative aspiration. After waiting for at least 2 minutes, the ablation was performed at 80 degrees C for 60 seconds, using regular Radiofrequency settings. Once the procedure was completed, the needles were then removed and the area cleansed, making sure to leave some of the prepping solution back to take advantage of its long term bactericidal properties. Intra-operative Compliance: Compliant Vitals:   05/07/18 1135 05/07/18 1144 05/07/18 1154 05/07/18 1205  BP: (!) 143/75 (!) 163/97 (!) 160/102 (!) 157/80  Pulse: (!) 119     Resp: 14 14 14 16   Temp:      SpO2: 100% 98% 99% 99%   Weight:      Height:        Start Time: 1053 hrs. End Time: 1132 hrs. Materials & Medications:  Needle(s) Type: Teflon-coated, curved tip, Radiofrequency needle(s) Gauge: 22G Length: 10cm Medication(s): Please see orders for medications and dosing details. 6 cc solution made of 5 cc of 0.2% ropivacaine, 1 cc of Decadron 10 mg/cc.  1 cc injected at each level above bilaterally prior to lesioning (after sensory and motor testing).  Imaging Guidance (Spinal):          Type of Imaging Technique: Fluoroscopy Guidance (Spinal) Indication(s): Assistance in needle guidance and placement for procedures requiring needle placement in or near specific anatomical locations not easily accessible without such assistance. Exposure Time: Please see nurses notes. Contrast: None used. Fluoroscopic Guidance: I was personally present during the use of fluoroscopy. "Tunnel Vision Technique" used to obtain the best possible view of the target area. Parallax error corrected before commencing the procedure. "Direction-depth-direction" technique used to introduce the needle under continuous pulsed fluoroscopy. Once target was reached, antero-posterior, oblique, and lateral fluoroscopic projection used confirm needle placement in all planes. Images permanently stored in EMR. Interpretation: No contrast injected. I personally interpreted the imaging intraoperatively. Adequate needle placement confirmed in multiple planes. Permanent images saved into the patient's record.  Antibiotic Prophylaxis:   Anti-infectives (From admission, onward)   None     Indication(s): None identified  Post-operative Assessment:  Post-procedure Vital Signs:  Pulse/HCG Rate: (!) 119(!) 119 Temp: 98 F (36.7 C) Resp: 16 BP: (!) 157/80 SpO2: 99 %  EBL: None  Complications: No immediate post-treatment complications observed by team, or reported by patient.  Note: The patient tolerated the entire procedure well. A repeat set of  vitals were taken after the procedure and the patient was kept under observation following institutional policy, for this type of procedure. Post-procedural neurological assessment was performed, showing return to baseline, prior to discharge. The patient was provided with post-procedure discharge instructions, including a section on how to  identify potential problems. Should any problems arise concerning this procedure, the patient was given instructions to immediately contact us, at any time, without hesitation. In any case, we plan to contact the patient by telephone for a follow-up status report regarding this interventional procedure.  Comments:  No additional relevant information.  Plan of Care    Imaging Orders     DG C-Arm 1-60 Min-No Report Procedure Orders    No procedure(s) ordered today    Medications ordered for procedure: Meds ordered this encounter  Medications  . lactated ringers infusion 1,000 mL  . fentaNYL (SUBLIMAZE) injection 25-100 mcg    Make sure Narcan is available in the pyxis when using this medication. In the event of respiratory depression (RR< 8/min): Titrate NARCAN (naloxone) in increments of 0.1 to 0.2 mg IV at 2-3 minute intervals, until desired degree of reversal.  . ropivacaine (PF) 2 mg/mL (0.2%) (NAROPIN) injection 10 mL  . lidocaine (XYLOCAINE) 2 % (with pres) injection 400 mg  . dexamethasone (DECADRON) injection 10 mg   Medications administered: We administered lactated ringers, fentaNYL, ropivacaine (PF) 2 mg/mL (0.2%), lidocaine, and dexamethasone.  See the medical record for exact dosing, route, and time of administration.  New Prescriptions   No medications on file   Disposition: Discharge home  Discharge Date & Time: 05/07/2018; 1205 hrs.   Physician-requested Follow-up: Return in about 6 weeks (around 06/18/2018) for Post Procedure Evaluation.  Future Appointments  Date Time Provider Celeste  05/18/2018  9:30 AM [facility]  [facility] None  05/18/2018 10:30 AM [facility] None  05/18/2018 11:30 AM Dew, Erskine Squibb, MD [facility] None  06/19/2018 11:45 AM Gillis Santa, MD [facility] None  09/06/2018 10:30 AM [name], DO [facility]  11/23/2018 11:00 AM [facility] NURSE HEALTH ADVISOR [facility]   Primary Care Physician: [name], DO Location: [facility] Note by: Gillis Santa, MD Date: 05/07/2018; Time: 1:26 PM  Disclaimer:  Medicine is not an exact science. The only guarantee in medicine is that nothing is guaranteed. It is important to note that the decision to proceed with this intervention was based on the information collected from the patient. The Data and conclusions were drawn from the patient's questionnaire, the interview, and the physical examination. Because the information was provided in large part by the patient, it cannot be guaranteed that it has not been purposely or unconsciously manipulated. Every effort has been made to obtain as much relevant data as possible for this evaluation. It is important to note that the conclusions that lead to this procedure are derived in large part from the available data. Always take into account that the treatment will also be dependent on availability of resources and existing treatment guidelines, considered by other Pain Management Practitioners as being common knowledge and practice, at the time of the intervention. For Medico-Legal purposes, it is also important to point out that variation in procedural techniques and pharmacological choices are the acceptable norm. The indications, contraindications, technique, and results of the above procedure should only be interpreted and judged by a Board-Certified Interventional Pain Specialist with extensive familiarity and expertise in the same exact procedure and technique.

## 2018-05-08 ENCOUNTER — Telehealth: Payer: Self-pay

## 2018-05-08 NOTE — Telephone Encounter (Signed)
Called patient. States she is doing well. Not much pain at all. Doing well.

## 2018-05-16 ENCOUNTER — Telehealth: Payer: Self-pay | Admitting: *Deleted

## 2018-05-16 NOTE — Telephone Encounter (Signed)
Dr. Holley Raring can you e-scribe this for her. Three month supply please (due to insurance). I double-checked the pharmacy, so it's good to go.       Thanks- Anderson Malta

## 2018-05-17 MED ORDER — GABAPENTIN 300 MG PO CAPS: 300.0000 mg | ORAL_CAPSULE | Freq: Two times a day (BID) | ORAL | 3 refills | Status: DC

## 2018-05-17 NOTE — Telephone Encounter (Signed)
Gabapentin e prescribed  Requested Prescriptions   Signed Prescriptions Disp Refills  . gabapentin (NEURONTIN) 300 MG capsule 60 capsule 3    Sig: Take 1 capsule (300 mg total) by mouth 2 (two) times daily.    Authorizing Provider: Gillis Santa

## 2018-05-18 ENCOUNTER — Ambulatory Visit (INDEPENDENT_AMBULATORY_CARE_PROVIDER_SITE_OTHER): Payer: Medicare Other

## 2018-05-18 ENCOUNTER — Encounter (INDEPENDENT_AMBULATORY_CARE_PROVIDER_SITE_OTHER): Payer: Self-pay

## 2018-05-18 ENCOUNTER — Encounter (INDEPENDENT_AMBULATORY_CARE_PROVIDER_SITE_OTHER): Payer: Self-pay | Admitting: Vascular Surgery

## 2018-05-18 ENCOUNTER — Ambulatory Visit (INDEPENDENT_AMBULATORY_CARE_PROVIDER_SITE_OTHER): Payer: Medicare Other | Admitting: Vascular Surgery

## 2018-05-18 VITALS — BP 139/76 | HR 85 | Resp 16 | Ht 61.0 in | Wt 112.0 lb

## 2018-05-18 DIAGNOSIS — I6523 Occlusion and stenosis of bilateral carotid arteries: Secondary | ICD-10-CM | POA: Diagnosis not present

## 2018-05-18 DIAGNOSIS — J449 Chronic obstructive pulmonary disease, unspecified: Secondary | ICD-10-CM | POA: Diagnosis not present

## 2018-05-18 DIAGNOSIS — F17210 Nicotine dependence, cigarettes, uncomplicated: Secondary | ICD-10-CM | POA: Diagnosis not present

## 2018-05-18 DIAGNOSIS — I70213 Atherosclerosis of native arteries of extremities with intermittent claudication, bilateral legs: Secondary | ICD-10-CM | POA: Diagnosis not present

## 2018-05-18 DIAGNOSIS — J410 Simple chronic bronchitis: Secondary | ICD-10-CM

## 2018-05-18 DIAGNOSIS — I10 Essential (primary) hypertension: Secondary | ICD-10-CM | POA: Diagnosis not present

## 2018-05-18 NOTE — Patient Instructions (Signed)

## 2018-05-18 NOTE — Progress Notes (Signed)
MRN : [id number]  Savannah Mcclure is a 77 y.o. (07/13/1941) female who presents with chief complaint of  Chief Complaint  Patient presents with  . Follow-up    pt conv carotid  .  History of Present Illness: Patient returns today in follow up of audible vascular issues.  Her symptoms are about the same as they were at her initial visit.  She continues to complain of short distance claudication in both legs.  She says they are about the same.  Her legs tire and we can easily with short distances of walking and she has to stop and rest.  This is keeping her from doing normal activities.  Noninvasive studies today show ABIs of 0.67 on the right and 0.64 on the left with monophasic waveforms consistent with arterial insufficiency which is fairly significant bilaterally.  Bilateral SFA occlusions are noted as well as suggestion of significant aortoiliac disease. She is also studied for her carotid disease.  Her right carotid stenosis was in the 40 to 59% range in her left carotid stenosis was in the 60 to 79% range.  She reports this to be stable from her previous studies.   Current Outpatient Medications  Medication Sig Dispense Refill  . B-COMPLEX-C PO Take 1 tablet by mouth daily.    . benazepril (LOTENSIN) 20 MG tablet Take 1 tablet (20 mg total) by mouth daily. 90 tablet 3  . cilostazol (PLETAL) 100 MG tablet Take 1 tablet (100 mg total) by mouth 2 (two) times daily. 180 tablet 1  . cyclobenzaprine (FLEXERIL) 5 MG tablet Take 1 tablet (5 mg total) by mouth 2 (two) times daily as needed for muscle spasms. 60 tablet 2  . diltiazem (CARDIZEM) 30 MG tablet Take 1 tablet (30 mg total) by mouth 3 (three) times daily as needed (Tachycardia HR > 130.). 180 tablet 1  . gabapentin (NEURONTIN) 300 MG capsule Take 1 capsule (300 mg total) by mouth 2 (two) times daily. 60 capsule 3  . hydrOXYzine (ATARAX/VISTARIL) 25 MG tablet Take 1 tablet (25 mg total) by mouth 3 (three) times daily as needed.  270 tablet 3  . ipratropium-albuterol (DUONEB) 0.5-2.5 (3) MG/3ML SOLN Take 3 mLs by nebulization every 6 (six) hours as needed. 360 mL 6  . lidocaine (LIDODERM) 5 % Place 1 patch onto the skin daily. Remove & Discard patch within 12 hours or as directed by MD 60 patch 6  . loperamide (ANTI-DIARRHEAL) 2 MG tablet Take 2 mg by mouth 4 (four) times daily as needed for diarrhea or loose stools.    . montelukast (SINGULAIR) 10 MG tablet Take 1 tablet (10 mg total) by mouth at bedtime. 90 tablet 3  . Multiple Vitamins-Minerals (MULTIVITAMIN WITH MINERALS) tablet Take 1 tablet by mouth daily.    Marland Kitchen oxyCODONE (OXY IR/ROXICODONE) 5 MG immediate release tablet Take 1 tablet (5 mg total) by mouth 3 (three) times daily as needed for severe pain. For chronic pain To last for 30 days from fill date 90 tablet 0  . tiotropium (SPIRIVA HANDIHALER) 18 MCG inhalation capsule Place 1 capsule (18 mcg total) into inhaler and inhale daily. 90 capsule 3  . triamcinolone ointment (KENALOG) 0.5 % Apply 1 application topically 2 (two) times daily. 30 g 0  . UNABLE TO FIND Take 1 tablet by mouth 2 (two) times daily. Cardio FX    . UNABLE TO FIND Take 1 tablet by mouth daily. Cinnergy    . UNABLE TO FIND Take 2  tablets by mouth daily. Joint FX     Current Facility-Administered Medications  Medication Dose Route Frequency Provider Last Rate Last Dose  . albuterol (PROVENTIL) (2.5 MG/3ML) 0.083% nebulizer solution 2.5 mg  2.5 mg Nebulization Once [name], DO        Past Medical History:  Diagnosis Date  . 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 Blind right eye   . Cardiac arrest ([place])   . Cataract    right eye  . COPD (chronic obstructive pulmonary disease) ([place])   . Hypertension   . Scoliosis   . Sepsis ([place])   . Spinal stenosis     Past Surgical History:  Procedure Laterality Date  . ABDOMINAL HYSTERECTOMY     total  . BACK SURGERY    . BREAST LUMPECTOMY Left   . right eye surgery    . WISDOM TOOTH  EXTRACTION      Social History Social History   Tobacco Use  . Smoking status: Current Every Day Smoker    Packs/day: 0.50  . Smokeless tobacco: Former Network engineer Use Topics  . Alcohol use: Yes    Frequency: Never    Comment: wine on occasion  . Drug use: No    Family History Family History  Problem Relation Age of Onset  . Intracerebral hemorrhage Mother   . Hypertension Mother   . Heart failure Father   . Schizophrenia Daughter   . Rheum arthritis Maternal Grandmother   . Kidney disease Maternal Grandmother   . Heart disease Paternal Grandmother   . Stroke Paternal Grandmother   . Stroke Paternal Grandfather   . Heart disease Paternal Grandfather     Allergies  Allergen Reactions  . Other Itching    Cat Gut sutures   . Amlodipine Hives  . Azithromycin Hives  . Doxycycline Diarrhea  . Erythromycin Diarrhea  . Hydrocodone Nausea Only  . Lactose Intolerance ([place])   . Minocycline Diarrhea  . Nsaids Other (See Comments)    Internal Bleeding  . Relafen [Nabumetone] Hives  . Sulfa Antibiotics Hives  . Tetracyclines & Related Diarrhea    REVIEW OF SYSTEMS (Negative unless checked)  Constitutional: [] Weight loss  [] Fever  [] Chills Cardiac: [] Chest pain   [] Chest pressure   [] Palpitations   [] Shortness of breath when laying flat   [] Shortness of breath at rest   [] Shortness of breath with exertion. Vascular:  [x] Pain in legs with walking   [] Pain in legs at rest   [] Pain in legs when laying flat   [x] Claudication   [] Pain in feet when walking  [] Pain in feet at rest  [] Pain in feet when laying flat   [] History of DVT   [] Phlebitis   [] Swelling in legs   [] Varicose veins   [] Non-healing ulcers Pulmonary:   [] Uses home oxygen   [] Productive cough   [] Hemoptysis   [] Wheeze  [] COPD   [] Asthma Neurologic:  [] Dizziness  [] Blackouts   [] Seizures   [] History of stroke   [] History of TIA  [] Aphasia   [x] Temporary blindness   [] Dysphagia   [] Weakness or numbness in arms    [] Weakness or numbness in legs Musculoskeletal:  [] Arthritis   [] Joint swelling   [x] Joint pain   [x] Low back pain Hematologic:  [] Easy bruising  [] Easy bleeding   [] Hypercoagulable state   [] Anemic  [] Hepatitis Gastrointestinal:  [] Blood in stool   [] Vomiting blood  [] Gastroesophageal reflux/heartburn   [] Abdominal pain Genitourinary:  [] Chronic kidney disease   [] Difficult urination  []   Frequent urination  [] Burning with urination   [] Hematuria Skin:  [] Rashes   [] Ulcers   [] Wounds Psychological:  [] History of anxiety   []  History of major depression.   Physical Examination  BP 139/76 (BP Location: Right Arm)   Pulse 85   Resp 16   Ht 5\' 1"  (1.549 m)   Wt 112 lb (50.8 kg)   BMI 21.16 kg/m  Gen:  WD/WN, NAD Head: [place]/AT, No temporalis wasting. Ear/Nose/Throat: Hearing grossly intact, nares w/o erythema or drainage Eyes: Conjunctiva clear. Sclera non-icteric.  Right eye vision loss Neck: Supple.  Trachea midline Pulmonary:  Good air movement, no use of accessory muscles.  Cardiac: RRR, no JVD Vascular:  Vessel Right Left  Radial Palpable Palpable                          PT  not palpable  not palpable  DP  1+ palpable  trace palpable    Musculoskeletal: M/S 5/5 throughout.  No deformity or atrophy.  Sluggish capillary refill in both feet.  No edema. Neurologic: Sensation grossly intact in extremities.  Symmetrical.  Speech is fluent.  Psychiatric: Judgment intact, Mood & affect appropriate for pt's clinical situation. Dermatologic: No rashes or ulcers noted.  No cellulitis or open wounds.       Labs No results found for this or any previous visit (from the past 2160 hour(s)).  Radiology Dg C-arm 1-60 Min-no Report  Result Date: 05/07/2018 Fluoroscopy was utilized by the requesting physician.  No radiographic interpretation.    Assessment/Plan HTN (hypertension) blood pressure control important in reducing the progression of atherosclerotic disease. On  appropriate oral medications.   COPD (chronic obstructive pulmonary disease) (HCC) Continue pulmonary medications and aerosols as already ordered, these medications have been reviewed and there are no changes at this time.  Carotid stenosis Her right carotid stenosis was in the 40 to 59% range in her left carotid stenosis was in the 60 to 79% range.  At this point, medical management would still be continued and recommended.  We will recheck this in 6 months with duplex.  Atherosclerosis of native arteries of extremity with intermittent claudication (HCC) Noninvasive studies today show ABIs of 0.67 on the right and 0.64 on the left with monophasic waveforms consistent with arterial insufficiency which is fairly significant bilaterally.  Bilateral SFA occlusions are noted as well as suggestion of significant aortoiliac disease.  Recommend:  The patient has experienced increased symptoms and is now describing lifestyle limiting claudication and mild rest pain.   Given the severity of the patient's lower extremity symptoms the patient should undergo angiography and intervention.  Risk and benefits were reviewed the patient.  Indications for the procedure were reviewed.  All questions were answered, the patient agrees to proceed.   The patient should continue walking and begin a more formal exercise program.  The patient should continue antiplatelet therapy and aggressive treatment of the lipid abnormalities  The patient will follow up with me after the angiogram.     Leotis Pain, MD  05/18/2018 1:32 PM    This note was created with Dragon medical transcription system.  Any errors from dictation are purely unintentional

## 2018-05-18 NOTE — Assessment & Plan Note (Signed)
Noninvasive studies today show ABIs of 0.67 on the right and 0.64 on the left with monophasic waveforms consistent with arterial insufficiency which is fairly significant bilaterally.  Bilateral SFA occlusions are noted as well as suggestion of significant aortoiliac disease.  Recommend:  The patient has experienced increased symptoms and is now describing lifestyle limiting claudication and mild rest pain.   Given the severity of the patient's lower extremity symptoms the patient should undergo angiography and intervention.  Risk and benefits were reviewed the patient.  Indications for the procedure were reviewed.  All questions were answered, the patient agrees to proceed.   The patient should continue walking and begin a more formal exercise program.  The patient should continue antiplatelet therapy and aggressive treatment of the lipid abnormalities  The patient will follow up with me after the angiogram.

## 2018-05-18 NOTE — Assessment & Plan Note (Signed)
Her right carotid stenosis was in the 40 to 59% range in her left carotid stenosis was in the 60 to 79% range.  At this point, medical management would still be continued and recommended.  We will recheck this in 6 months with duplex.

## 2018-05-22 DIAGNOSIS — H44521 Atrophy of globe, right eye: Secondary | ICD-10-CM | POA: Diagnosis not present

## 2018-05-29 ENCOUNTER — Other Ambulatory Visit (INDEPENDENT_AMBULATORY_CARE_PROVIDER_SITE_OTHER): Payer: Self-pay | Admitting: Vascular Surgery

## 2018-06-05 ENCOUNTER — Encounter: Payer: Self-pay | Admitting: Family Medicine

## 2018-06-05 ENCOUNTER — Ambulatory Visit (INDEPENDENT_AMBULATORY_CARE_PROVIDER_SITE_OTHER): Payer: Medicare Other | Admitting: Family Medicine

## 2018-06-05 ENCOUNTER — Other Ambulatory Visit (INDEPENDENT_AMBULATORY_CARE_PROVIDER_SITE_OTHER): Payer: Self-pay | Admitting: Nurse Practitioner

## 2018-06-05 ENCOUNTER — Encounter
Admission: RE | Admit: 2018-06-05 | Discharge: 2018-06-05 | Disposition: A | Discharge status: Home / Self Care | Payer: Medicare Other | Source: Ambulatory Visit | Attending: Vascular Surgery | Admitting: Vascular Surgery

## 2018-06-05 ENCOUNTER — Telehealth (INDEPENDENT_AMBULATORY_CARE_PROVIDER_SITE_OTHER): Payer: Self-pay

## 2018-06-05 VITALS — BP 101/67 | HR 139 | Wt 113.0 lb

## 2018-06-05 DIAGNOSIS — R009 Unspecified abnormalities of heart beat: Secondary | ICD-10-CM | POA: Diagnosis not present

## 2018-06-05 DIAGNOSIS — I6523 Occlusion and stenosis of bilateral carotid arteries: Secondary | ICD-10-CM | POA: Diagnosis not present

## 2018-06-05 DIAGNOSIS — R000 Tachycardia, unspecified: Secondary | ICD-10-CM | POA: Diagnosis not present

## 2018-06-05 DIAGNOSIS — Z01818 Encounter for other preprocedural examination: Secondary | ICD-10-CM | POA: Insufficient documentation

## 2018-06-05 HISTORY — DX: Anxiety disorder, unspecified: F41.9

## 2018-06-05 HISTORY — DX: Endocarditis, valve unspecified: I38

## 2018-06-05 HISTORY — DX: Peripheral vascular disease, unspecified: I73.9

## 2018-06-05 HISTORY — DX: Dyspnea, unspecified: R06.00

## 2018-06-05 HISTORY — DX: Tachycardia, unspecified: R00.0

## 2018-06-05 HISTORY — DX: Hypoxemia: R09.02

## 2018-06-05 LAB — CREATININE, SERUM: Creatinine, Ser: 0.72 mg/dL (ref 0.44–1.00)

## 2018-06-05 LAB — BUN: BUN: 11 mg/dL (ref 8–23)

## 2018-06-05 NOTE — Telephone Encounter (Signed)
-----   Message from Kris Hartmann, NP sent at 06/05/2018 11:43 AM EDT ----- Could someone please call Savannah Mcclure about her recent EKG at pre-admit testing.  She needs to contact her PCP regarding her heart rate or go to urgent care if she begins to feel dizzy, weak of light headed.  She will now need cardiac clearance before her Leg angio. I have placed a referral to cardiology and they will contact her.  Thanks.

## 2018-06-05 NOTE — Progress Notes (Signed)
BP 101/67   Pulse (!) 139   Wt 113 lb (51.3 kg)   SpO2 90%   BMI 21.35 kg/m    Subjective:    Patient ID: Savannah Mcclure, female    DOB: Sep 13, 1940, 77 y.o.   MRN: [id number]  HPI: Savannah Mcclure is a 77 y.o. female  Chief Complaint  Patient presents with  . Medical Clearance   Went for preadmission testing this morning for Dr. Lucky Cowboy for an angiography of her lower extremities. She was found to have an "irregular heart beat" and sent here for evaluation. EKG showed RBBB and sinus tachycardia. She stopped taking the cardizem because she doesn't have any way of checking her HR. She states that she is feeling OK. She is feeling a bit loopy. No SOB. No chest pain. + dizzy- she is unsure if this is due to allergies or due to taking a flexeril or due to her HR. She has had a long history of sinus tachycardia, but has not been taking her cardizem. Has not followed up with a cardiologist in quite some time. She is otherwise feeling OK. No other concerns.   Relevant past medical, surgical, family and social history reviewed and updated as indicated. Interim medical history since our last visit reviewed. Allergies and medications reviewed and updated.  Review of Systems  Constitutional: Negative.   HENT: Positive for congestion. Negative for dental problem, drooling, ear discharge, ear pain, facial swelling, hearing loss, mouth sores, nosebleeds, postnasal drip, rhinorrhea, sinus pressure, sinus pain, sneezing, sore throat, tinnitus, trouble swallowing and voice change.   Respiratory: Negative.   Cardiovascular: Positive for palpitations. Negative for chest pain and leg swelling.  Gastrointestinal: Negative.   Neurological: Positive for dizziness and light-headedness. Negative for tremors, seizures, syncope, facial asymmetry, speech difficulty, weakness, numbness and headaches.  Psychiatric/Behavioral: Negative for agitation, behavioral problems, confusion, decreased  concentration, dysphoric mood, hallucinations, self-injury, sleep disturbance and suicidal ideas. The patient is nervous/anxious. The patient is not hyperactive.     Per HPI unless specifically indicated above     Objective:    BP 101/67   Pulse (!) 139   Wt 113 lb (51.3 kg)   SpO2 90%   BMI 21.35 kg/m   Wt Readings from Last 3 Encounters:  06/05/18 113 lb (51.3 kg)  06/05/18 112 lb (50.8 kg)  05/18/18 112 lb (50.8 kg)    Physical Exam  Constitutional: She is oriented to person, place, and time. She appears well-developed and well-nourished. No distress.  HENT:  Head: Normocephalic and atraumatic.  Right Ear: Hearing normal.  Left Ear: Hearing normal.  Nose: Nose normal.  Eyes: Conjunctivae and lids are normal. Right eye exhibits no discharge. Left eye exhibits no discharge. No scleral icterus.  Cardiovascular: Regular rhythm, normal heart sounds and intact distal pulses. Tachycardia present. Exam reveals no gallop and no friction rub.  No murmur heard. Pulmonary/Chest: Effort normal and breath sounds normal. No stridor. No respiratory distress. She has no wheezes. She has no rales. She exhibits no tenderness.  Musculoskeletal: Normal range of motion.  Neurological: She is alert and oriented to person, place, and time.  Skin: Skin is warm, dry and intact. Capillary refill takes less than 2 seconds. No rash noted. She is not diaphoretic. No erythema. No pallor.  Psychiatric: She has a normal mood and affect. Her speech is normal and behavior is normal. Judgment and thought content normal. Cognition and memory are normal.  Nursing note and vitals reviewed.   Results  for orders placed or performed during the hospital encounter of 06/05/18  BUN  Result Value Ref Range   BUN 11 8 - 23 mg/dL  Creatinine, serum  Result Value Ref Range   Creatinine, Ser 0.72 0.44 - 1.00 mg/dL   GFR calc non Af Amer >60 >60 mL/min   GFR calc Af Amer >60 >60 mL/min      Assessment & Plan:    Problem List Items Addressed This Visit      Other   Sinus tachycardia - Primary    Chronic, has not been taking her cardizem as she has no way to check her HR. Rx for pulse oximeter given today. Restart her cardizem for HR >130. Will get her into see cardiology- appointment scheduled today. Call with any concerns and recheck in 1-2 weeks if she has not seen cardiology yet.        Other Visit Diagnoses    Heart beat abnormality       Tachycardic.    Relevant Orders   EKG 12-Lead (Completed)       Follow up plan: Return 1-2 weeks , for if you have not seen cardiology.

## 2018-06-05 NOTE — Patient Instructions (Signed)
.  [facility] [phone]   06/11/18 @ 3:45

## 2018-06-05 NOTE — Pre-Procedure Instructions (Signed)
Dr Bunnie Domino office called in regards to patient's heart rate in 130's.  EKG ordered.

## 2018-06-05 NOTE — Telephone Encounter (Signed)
I spoke to the patient and let her know that if she gets dizzy or lightheaded that she needs to go to the ER or the Urgent care. She states that she has already been contacted by the cardiologist to get the clearance that she needs to move forward with having the angio done.

## 2018-06-05 NOTE — Assessment & Plan Note (Signed)
Chronic, has not been taking her cardizem as she has no way to check her HR. Rx for pulse oximeter given today. Restart her cardizem for HR >130. Will get her into see cardiology- appointment scheduled today. Call with any concerns and recheck in 1-2 weeks if she has not seen cardiology yet.

## 2018-06-05 NOTE — Pre-Procedure Instructions (Addendum)
Called Eulogio Ditch NP regarding results of EKG.   EKG faxed to Dr Bunnie Domino and to [facility] Spaniel PCP office. Dr Bunnie Domino office to follow through regarding abnormal EKG and arrange follow ups with PCP or cardiology.  Patient informed regarding abnormal EKG and need to see primary care physician.  Patient denies chest pain and states "my heart rate is always fast, and I can't take Cardizem: it makes my heart flip flop."

## 2018-06-10 NOTE — Progress Notes (Signed)
Cardiology Office Note  Date:  06/11/2018   ID:  Savannah Mcclure, DOB Oct 02, 1940, MRN [id number]  PCP:  [name], DO   Chief Complaint  Patient presents with  . OTHER    Cardiac clearance c/o diltiazem making heart feel like its "flip flopping and back pain radiates to lower legs . Meds reviewed verbally with pt.    HPI:  [name] is a 77 year old woman with past medical history of PAD , bilateral SFA occlusion, bilateral carotid disease Hypertension Hyperlipidemia Smoker Sinus tachycardia,through Through all of 2019 on EKGs, dating back to 2014 referred by Lavena Stanford for sinus tachycardia on pre-admit EKG, PAD, CAD, preop cardiovascular evaluation  Abnormal EKG at baseline, old infarct Being scheduled for lower extremity angiography Requires cardiac clearance per anesthesia  Recently seen by primary care for tachycardia and preop evaluation Was restarted on Cardizem She did not tolerate the diltiazem, caused palpitations, felt weird and stopped the medication  Reports having claudication in her legs ABIs 0.6 bilaterally with monophasic waveforms Bilateral SFAs occlusions Suggestion of significant aortoiliac disease Carotid on the right 40 to 59% Left 60 to 79%  She does not want a statin, reports having side effects in the past  CT chest May 2019 Moderate aortic atherosclerosis, three-vessel coronary atherosclerosis including left main noted Images pulled up and discussed with her today  EKG personally reviewed by myself on todays visit Shows sinus tachycardia rate 136 bpm right bundle branch block, consider old inferior MI, unable to exclude old anterior MI  Review of prior EKGs all through 2019 showing  tachycardia rate in the 130s  tachycardia noted on EKG in 2014  PMH:   has a past medical history of Aneurysm ([place]), Anxiety, Blind right eye, Cardiac arrest ([place]), Cataract, Claudication ([place]), COPD (chronic  obstructive pulmonary disease) ([place]), Dyspnea, Heart murmur, Hypertension, Leaky heart valve, Oxygen deficit, Peripheral vascular disease ([place]), Scoliosis, Sepsis ([place]), Spinal stenosis, Stroke ([place]), and Tachycardia.  PSH:    Past Surgical History:  Procedure Laterality Date  . ABDOMINAL HYSTERECTOMY     total  . BACK SURGERY    . BREAST LUMPECTOMY Left   . 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 WISDOM TOOTH EXTRACTION      Current Outpatient Medications  Medication Sig Dispense Refill  . B-COMPLEX-C PO Take 1 tablet by mouth daily.    . benazepril (LOTENSIN) 20 MG tablet Take 1 tablet (20 mg total) by mouth daily. 90 tablet 3  . cilostazol (PLETAL) 100 MG tablet Take 1 tablet (100 mg total) by mouth 2 (two) times daily. 180 tablet 1  . cyclobenzaprine (FLEXERIL) 5 MG tablet Take 1 tablet (5 mg total) by mouth 2 (two) times daily as needed for muscle spasms. 60 tablet 2  . gabapentin (NEURONTIN) 300 MG capsule Take 1 capsule (300 mg total) by mouth 2 (two) times daily. 60 capsule 3  . hydrOXYzine (ATARAX/VISTARIL) 25 MG tablet Take 1 tablet (25 mg total) by mouth 3 (three) times daily as needed. 270 tablet 3  . ipratropium-albuterol (DUONEB) 0.5-2.5 (3) MG/3ML SOLN Take 3 mLs by nebulization every 6 (six) hours as needed. 360 mL 6  . lidocaine (LIDODERM) 5 % Place 1 patch onto the skin daily. Remove & Discard patch within 12 hours or as directed by MD 60 patch 6  . MAGNESIUM CHLORIDE PO Take 1 capsule by mouth daily.    . montelukast (SINGULAIR) 10 MG tablet  Take 1 tablet (10 mg total) by mouth at bedtime. 90 tablet 3  . Multiple Vitamins-Minerals (MULTIVITAMIN WITH MINERALS) tablet Take 1 tablet by mouth daily.    Marland Kitchen NIACINAMIDE PO Take 1 capsule by mouth daily.    Marland Kitchen oxyCODONE (OXY IR/ROXICODONE) 5 MG immediate release tablet Take 1 tablet (5 mg total) by mouth 3 (three) times daily as needed for severe pain. For chronic pain To last for 30 days from fill date 90 tablet  0  . POTASSIUM CITRATE PO Take 1 capsule by mouth daily.    Marland Kitchen tiotropium (SPIRIVA HANDIHALER) 18 MCG inhalation capsule Place 1 capsule (18 mcg total) into inhaler and inhale daily. 90 capsule 3  . triamcinolone ointment (KENALOG) 0.5 % Apply 1 application topically 2 (two) times daily. 30 g 0  . UNABLE TO FIND Take 1 tablet by mouth 2 (two) times daily. Cardio FX    . UNABLE TO FIND Take 1 tablet by mouth daily. Cinnergy    . UNABLE TO FIND Take 2 tablets by mouth daily. Joint FX     Current Facility-Administered Medications  Medication Dose Route Frequency Provider Last Rate Last Dose  . albuterol (PROVENTIL) (2.5 MG/3ML) 0.083% nebulizer solution 2.5 mg  2.5 mg Nebulization Once Johnson, Megan P, DO         Allergies:   Other; Amlodipine; Azithromycin; Doxycycline; Erythromycin; Hydrocodone; Lactose intolerance (gi); Minocycline; Nsaids; Relafen [nabumetone]; Sulfa antibiotics; and Tetracyclines & related   Social History:  The patient  reports that she has been smoking. She has a 30.00 pack-year smoking history. She has quit using smokeless tobacco. She reports that she drinks alcohol. She reports that she does not use drugs.   Family History:   family history includes Heart disease in her paternal grandfather and paternal grandmother; Heart failure in her father; Hypertension in her mother; Intracerebral hemorrhage in her mother; Kidney disease in her maternal grandmother; Rheum arthritis in her maternal grandmother; Schizophrenia in her daughter; Stroke in her paternal grandfather and paternal grandmother.    Review of Systems: Review of Systems  Constitutional: Negative.   Respiratory: Negative.   Cardiovascular: Negative.   Gastrointestinal: Negative.   Musculoskeletal: Negative.        Pain in her legs  Neurological: Negative.   Psychiatric/Behavioral: Negative.   All other systems reviewed and are negative.    PHYSICAL EXAM: VS:  BP (!) 92/52 (BP Location: Left Arm,  Patient Position: Sitting, Cuff Size: Normal)   Pulse (!) 136   Ht 5\' 1"  (1.549 m)   Wt 114 lb (51.7 kg)   BMI 21.54 kg/m  , BMI Body mass index is 21.54 kg/m. GEN: Well nourished, well developed, in no acute distress  HEENT: normal  Neck: no JVD, carotid bruits, or masses Cardiac: Regular, tachycardic,  no murmurs, rubs, or gallops,no edema  Poor pulses lower extremities Respiratory:  clear to auscultation bilaterally, normal work of breathing GI: soft, nontender, nondistended, + BS MS: no deformity or atrophy  Skin: warm and dry, no rash Neuro:  Strength and sensation are intact Psych: euthymic mood, full affect   Recent Labs: 10/13/2017: Magnesium 1.8 10/30/2017: ALT 9; TSH 3.050 10/31/2017: Hemoglobin 11.1; Platelets 412; Potassium 3.4; Sodium 137 06/05/2018: BUN 11; Creatinine, Ser 0.72    Lipid Panel Lab Results  Component Value Date   CHOL 166 09/11/2012   HDL 27 (L) 09/11/2012   LDLCALC 109 (H) 09/11/2012   TRIG 151 09/11/2012      Wt Readings from Last 3  Encounters:  06/11/18 114 lb (51.7 kg)  06/05/18 112 lb (50.8 kg)  06/05/18 113 lb (51.3 kg)      ASSESSMENT AND PLAN:  PAD (peripheral artery disease) ([place]) - Plan: EKG 12-Lead Bilateral SFA occlusion, scheduled for angiogram October 31 Also with moderate bilateral carotid disease  Simple chronic bronchitis ([place]) Managed by primary care, he needs to smoke  Sinus tachycardia - Plan: EKG 12-Lead Dating back to 2014, all through 2019 Etiology unclear We will discuss with the EP, Echocardiogram has been ordered to rule out structural heart disease, cardiomyopathy Unable to exclude atrial tachycardia that has been persistent At this time she does not want additional medication such as beta-blockers  Atherosclerosis of native artery of both lower extremities with intermittent claudication (HCC) severe three-vessel and left main disease on CT scan Stress test as below  Bilateral carotid artery  stenosis Followed by vascular, moderate bilateral disease  History of tobacco abuse We have encouraged her to continue to work on weaning her cigarettes and smoking cessation. She will continue to work on this and does not want any assistance with chantix.   Essential hypertension Recommend she hold benazepril, blood pressure running low even on my recheck  Coronary artery disease of native artery of native heart with stable angina pectoris ([place]) Severe three-vessel coronary disease including left main disease EKG concerning for prior MI Recommended pharmacologic Myoview.  Uncertain if this will be possible given her tachycardia rate in the 130s  Disposition:   We will call her with results of echocardiogram and stress test Will discuss with EP   Total encounter time more than 60 minutes  Greater than 50% was spent in counseling and coordination of care with the patient    Orders Placed This Encounter  Procedures  . EKG 12-Lead     Signed, Esmond Plants, M.D., Ph.D. 06/11/2018  [place], [place]

## 2018-06-11 ENCOUNTER — Other Ambulatory Visit (INDEPENDENT_AMBULATORY_CARE_PROVIDER_SITE_OTHER): Payer: Self-pay | Admitting: Vascular Surgery

## 2018-06-11 ENCOUNTER — Encounter: Payer: Self-pay | Admitting: Cardiovascular Disease

## 2018-06-11 ENCOUNTER — Ambulatory Visit (INDEPENDENT_AMBULATORY_CARE_PROVIDER_SITE_OTHER): Payer: Medicare Other | Admitting: Cardiovascular Disease

## 2018-06-11 VITALS — BP 92/52 | HR 136 | Ht 61.0 in | Wt 114.0 lb

## 2018-06-11 DIAGNOSIS — Z87891 Personal history of nicotine dependence: Secondary | ICD-10-CM

## 2018-06-11 DIAGNOSIS — G894 Chronic pain syndrome: Secondary | ICD-10-CM | POA: Diagnosis not present

## 2018-06-11 DIAGNOSIS — I739 Peripheral vascular disease, unspecified: Secondary | ICD-10-CM | POA: Diagnosis not present

## 2018-06-11 DIAGNOSIS — R9431 Abnormal electrocardiogram [ECG] [EKG]: Secondary | ICD-10-CM | POA: Diagnosis not present

## 2018-06-11 DIAGNOSIS — I10 Essential (primary) hypertension: Secondary | ICD-10-CM

## 2018-06-11 DIAGNOSIS — I70213 Atherosclerosis of native arteries of extremities with intermittent claudication, bilateral legs: Secondary | ICD-10-CM | POA: Diagnosis not present

## 2018-06-11 DIAGNOSIS — J410 Simple chronic bronchitis: Secondary | ICD-10-CM | POA: Diagnosis not present

## 2018-06-11 DIAGNOSIS — R000 Tachycardia, unspecified: Secondary | ICD-10-CM

## 2018-06-11 DIAGNOSIS — I6523 Occlusion and stenosis of bilateral carotid arteries: Secondary | ICD-10-CM | POA: Diagnosis not present

## 2018-06-11 DIAGNOSIS — I25118 Atherosclerotic heart disease of native coronary artery with other forms of angina pectoris: Secondary | ICD-10-CM

## 2018-06-11 NOTE — Patient Instructions (Addendum)
Medication Instructions:  Please stop the benazepril, pressure is low  If you need a refill on your cardiac medications before your next appointment, please call your pharmacy.   Lab work: No new labs needed  If you have labs (blood work) drawn today and your tests are completely normal, you will receive your results only by: Marland Kitchen MyChart Message (if you have MyChart) OR . A paper copy in the mail If you have any lab test that is abnormal or we need to change your treatment, we will call you to review the results.   Testing/Procedures: We will order a echocardiogram for sinus tachycardia, abnormal EKG  Echocardiography is a painless test that uses sound waves to create images of your heart. It provides your doctor with information about the size and shape of your heart and how well your heart's chambers and valves are working. This procedure takes approximately one hour. There are no restrictions for this procedure.   We will also order a lexiscan myoview for CAD, tachycardia, PAD, preop cardiovascular eval   [facility] MYOVIEW  Your caregiver has ordered a Stress Test with nuclear imaging. The purpose of this test is to evaluate the blood supply to your heart muscle. This procedure is referred to as a "Non-Invasive Stress Test." This is because other than having an IV started in your vein, nothing is inserted or "invades" your body. Cardiac stress tests are done to find areas of poor blood flow to the heart by determining the extent of coronary artery disease (CAD). Some patients exercise on a treadmill, which naturally increases the blood flow to your heart, while others who are  unable to walk on a treadmill due to physical limitations have a pharmacologic/chemical stress agent called Lexiscan . This medicine will mimic walking on a treadmill by temporarily increasing your coronary blood flow.   Please note: these test may take anywhere between 2-4 hours to complete  PLEASE REPORT TO [place] AT THE FIRST DESK WILL DIRECT YOU WHERE TO GO  Date of Procedure:_____________________________________  Arrival Time for Procedure:______________________________  Instructions regarding medication:   _x___ : You may take all of your medications with enough water to get them down safely the morning of your procedure   PLEASE NOTIFY THE OFFICE AT LEAST 24 HOURS IN ADVANCE IF YOU ARE UNABLE TO [place].  [phone] AND  PLEASE NOTIFY NUCLEAR MEDICINE AT [facility] AT LEAST 24 HOURS IN ADVANCE IF YOU ARE UNABLE TO KEEP YOUR APPOINTMENT. [phone]  How to prepare for your Myoview test:  1. Do not eat or drink after midnight 2. No caffeine for 24 hours prior to test 3. No smoking 24 hours prior to test. 4. Your medication may be taken with water.  If your doctor stopped a medication because of this test, do not take that medication. 5. Ladies, please do not wear dresses.  Skirts or pants are appropriate. Please wear a short sleeve shirt. 6. No perfume, cologne or lotion. 7. Wear comfortable walking shoes. No heels!  Follow-Up: At [facility], you and your health needs are our priority.  As part of our continuing mission to provide you with exceptional heart care, we have created designated Provider Care Teams.  These Care Teams include your primary Cardiologist (physician) and Advanced Practice Providers (APPs -  Physician Assistants and Nurse Practitioners) who all work together to provide you with the care you need, when you need it. . You will need a follow  up appointment : as needed, we will call with results .   Please call our office 2 months in advance to schedule this appointment.    . Providers on your designated Care Team:   . Murray Hodgkins, NP . Christell Faith, PA-C . Marrianne Mood, PA-C  Any Other Special Instructions Will Be Listed Below (If Applicable).  For educational health videos Log in to : [email or web address] Or :  [email or web address], password : [email or web address]  Echocardiogram An echocardiogram, or echocardiography, uses sound waves (ultrasound) to produce an image of your heart. The echocardiogram is simple, painless, obtained within a short period of time, and offers valuable information to your health care provider. The images from an echocardiogram can provide information such as:  Evidence of coronary artery disease (CAD).  Heart size.  Heart muscle function.  Heart valve function.  Aneurysm detection.  Evidence of a past heart attack.  Fluid buildup around the heart.  Heart muscle thickening.  Assess heart valve function.  Tell a health care provider about:  Any allergies you have.  All medicines you are taking, including vitamins, herbs, eye drops, creams, and over-the-counter medicines.  Any problems you or family members have had with anesthetic medicines.  Any blood disorders you have.  Any surgeries you have had.  Any medical conditions you have.  Whether you are pregnant or may be pregnant. What happens before the procedure? No special preparation is needed. Eat and drink normally. What happens during the procedure?  In order to produce an image of your heart, gel will be applied to your chest and a wand-like tool (transducer) will be moved over your chest. The gel will help transmit the sound waves from the transducer. The sound waves will harmlessly bounce off your heart to allow the heart images to be captured in real-time motion. These images will then be recorded.  You may need an IV to receive a medicine that improves the quality of the pictures. What happens after the procedure? You may return to your normal schedule including diet, activities, and medicines, unless your health care provider tells you otherwise. This information is not intended to replace advice given to you by your health care provider. Make sure you discuss any questions you have with your health care  provider. Document Released: 07/29/2000 Document Revised: 03/19/2016 Document Reviewed: 04/08/2013 Elsevier Interactive Patient Education  2017 [place].   Cardiac Nuclear Scan A cardiac nuclear scan is a test that measures blood flow to the heart when a person is resting and when he or she is exercising. The test looks for problems such as:  Not enough blood reaching a portion of the heart.  The heart muscle not working normally.  You may need this test if:  You have heart disease.  You have had abnormal lab results.  You have had heart surgery or angioplasty.  You have chest pain.  You have shortness of breath.  In this test, a radioactive dye (tracer) is injected into your bloodstream. After the tracer has traveled to your heart, an imaging device is used to measure how much of the tracer is absorbed by or distributed to various areas of your heart. This procedure is usually done at a hospital and takes 2-4 hours. Tell a health care provider about:  Any allergies you have.  All medicines you are taking, including vitamins, herbs, eye drops, creams, and over-the-counter medicines.  Any problems you or family members have had with the use of anesthetic medicines.  Any blood  disorders you have.  Any surgeries you have had.  Any medical conditions you have.  Whether you are pregnant or may be pregnant. What are the risks? Generally, this is a safe procedure. However, problems may occur, including:  Serious chest pain and heart attack. This is only a risk if the stress portion of the test is done.  Rapid heartbeat.  Sensation of warmth in your chest. This usually passes quickly.  What happens before the procedure?  Ask your health care provider about changing or stopping your regular medicines. This is especially important if you are taking diabetes medicines or blood thinners.  Remove your jewelry on the day of the procedure. What happens during the  procedure?  An IV tube will be inserted into one of your veins.  Your health care provider will inject a small amount of radioactive tracer through the tube.  You will wait for 20-40 minutes while the tracer travels through your bloodstream.  Your heart activity will be monitored with an electrocardiogram (ECG).  You will lie down on an exam table.  Images of your heart will be taken for about 15-20 minutes.  You may be asked to exercise on a treadmill or stationary bike. While you exercise, your heart's activity will be monitored with an ECG, and your blood pressure will be checked. If you are unable to exercise, you may be given a medicine to increase blood flow to parts of your heart.  When blood flow to your heart has peaked, a tracer will again be injected through the IV tube.  After 20-40 minutes, you will get back on the exam table and have more images taken of your heart.  When the procedure is over, your IV tube will be removed. The procedure may vary among health care providers and hospitals. Depending on the type of tracer used, scans may need to be repeated 3-4 hours later. What happens after the procedure?  Unless your health care provider tells you otherwise, you may return to your normal schedule, including diet, activities, and medicines.  Unless your health care provider tells you otherwise, you may increase your fluid intake. This will help flush the contrast dye from your body. Drink enough fluid to keep your urine clear or pale yellow.  It is up to you to get your test results. Ask your health care provider, or the department that is doing the test, when your results will be ready. Summary  A cardiac nuclear scan measures the blood flow to the heart when a person is resting and when he or she is exercising.  You may need this test if you are at risk for heart disease.  Tell your health care provider if you are pregnant.  Unless your health care provider tells  you otherwise, increase your fluid intake. This will help flush the contrast dye from your body. Drink enough fluid to keep your urine clear or pale yellow. This information is not intended to replace advice given to you by your health care provider. Make sure you discuss any questions you have with your health care provider. Document Released: 08/26/2004 Document Revised: 08/03/2016 Document Reviewed: 07/10/2013 Elsevier Interactive Patient Education  2017 [employer].

## 2018-06-13 ENCOUNTER — Encounter: Admission: RE | Payer: Self-pay | Source: Ambulatory Visit

## 2018-06-13 ENCOUNTER — Ambulatory Visit: Admission: RE | Admit: 2018-06-13 | Payer: Medicare Other | Source: Ambulatory Visit | Admitting: Vascular Surgery

## 2018-06-13 ENCOUNTER — Telehealth: Payer: Self-pay | Admitting: Cardiovascular Disease

## 2018-06-13 SURGERY — LOWER EXTREMITY ANGIOGRAPHY
Anesthesia: Moderate Sedation | Site: Leg Lower | Laterality: Left

## 2018-06-13 NOTE — Telephone Encounter (Signed)
Patient returning call.

## 2018-06-13 NOTE — Telephone Encounter (Signed)
I called and spoke with the patient. I have advised her that her stress test/ echo are scheduled on Friday morning 06/15/18. She is aware to arrive at 8:00 am. All instructions have been discussed with the patient. She is a smoker and has been advised- no smoking/ vaping for 24 hours before.   The patient voices understanding of the above.

## 2018-06-13 NOTE — Telephone Encounter (Signed)
Call received back from [place] in scheduling that the patient could have her lexiscan myoview at 8:30 am & echo at 11:00 am on Friday 06/15/18 at [facility].  I left a message for the patient to call back to confirm appointments and go over lexiscan instructions.  [place]  Your caregiver has ordered a Stress Test with nuclear imaging. The purpose of this test is to evaluate the blood supply to your heart muscle. This procedure is referred to as a "Non-Invasive Stress Test." This is because other than having an IV started in your vein, nothing is inserted or "invades" your body. Cardiac stress tests are done to find areas of poor blood flow to the heart by determining the extent of coronary artery disease (CAD). Some patients exercise on a treadmill, which naturally increases the blood flow to your heart, while others who are  unable to walk on a treadmill due to physical limitations have a pharmacologic/chemical stress agent called Lexiscan . This medicine will mimic walking on a treadmill by temporarily increasing your coronary blood flow.   Please note: these test may take anywhere between 2-4 hours to complete  PLEASE REPORT TO [place] AT THE FIRST DESK WILL DIRECT YOU WHERE TO GO  Date of Procedure: Friday 06/15/18  Arrival Time for Procedure: 8:00 am  Instructions regarding medication:   _x___ : Dennis Bast may take all of your regular medications with enough water to get them down safely the morning of your test  PLEASE NOTIFY THE OFFICE AT LEAST 24 HOURS IN ADVANCE IF YOU ARE UNABLE TO [place].  [phone] AND  PLEASE NOTIFY NUCLEAR MEDICINE AT [facility] AT LEAST 24 HOURS IN ADVANCE IF YOU ARE UNABLE TO KEEP YOUR APPOINTMENT. [phone]  How to prepare for your Myoview test:  1. Do not eat or drink after midnight 2. No caffeine for 24 hours prior to test 3. No smoking 24 hours prior to test. 4. Your medication may be taken with water.  If your  doctor stopped a medication because of this test, do not take that medication. 5. Ladies, please do not wear dresses.  Skirts or pants are appropriate. Please wear a short sleeve shirt. 6. No perfume, cologne or lotion. 7. Wear comfortable walking shoes. No heels!            Marland Kitchen

## 2018-06-15 ENCOUNTER — Encounter
Admission: RE | Admit: 2018-06-15 | Discharge: 2018-06-15 | Disposition: A | Discharge status: Home / Self Care | Payer: Medicare Other | Source: Ambulatory Visit | Attending: Cardiovascular Disease | Admitting: Cardiovascular Disease

## 2018-06-15 ENCOUNTER — Ambulatory Visit
Admission: RE | Admit: 2018-06-15 | Discharge: 2018-06-15 | Disposition: A | Discharge status: Home / Self Care | Payer: Medicare Other | Source: Ambulatory Visit | Attending: Cardiovascular Disease | Admitting: Cardiovascular Disease

## 2018-06-15 DIAGNOSIS — R000 Tachycardia, unspecified: Secondary | ICD-10-CM | POA: Insufficient documentation

## 2018-06-15 DIAGNOSIS — Z8674 Personal history of sudden cardiac arrest: Secondary | ICD-10-CM | POA: Diagnosis not present

## 2018-06-15 DIAGNOSIS — I739 Peripheral vascular disease, unspecified: Secondary | ICD-10-CM | POA: Insufficient documentation

## 2018-06-15 DIAGNOSIS — J449 Chronic obstructive pulmonary disease, unspecified: Secondary | ICD-10-CM | POA: Diagnosis not present

## 2018-06-15 DIAGNOSIS — R9431 Abnormal electrocardiogram [ECG] [EKG]: Secondary | ICD-10-CM | POA: Diagnosis not present

## 2018-06-15 DIAGNOSIS — I517 Cardiomegaly: Secondary | ICD-10-CM | POA: Diagnosis not present

## 2018-06-15 DIAGNOSIS — Z8673 Personal history of transient ischemic attack (TIA), and cerebral infarction without residual deficits: Secondary | ICD-10-CM | POA: Diagnosis not present

## 2018-06-15 LAB — NM MYOCAR MULTI W/SPECT W/WALL MOTION / EF
CHL CUP RESTING HR STRESS: 113 {beats}/min
CSEPEDS: 0 s
Estimated workload: 1 METS
Exercise duration (min): 0 min
LVDIAVOL: 34 mL (ref 46–106)
LVSYSVOL: 12 mL
MPHR: 143 {beats}/min
Peak HR: 133 {beats}/min
Percent HR: 93 %
SDS: 0
SRS: 3
SSS: 3
TID: 1.36

## 2018-06-15 MED ORDER — REGADENOSON 0.4 MG/5ML IV SOLN
0.4000 mg | Freq: Once | INTRAVENOUS | Status: AC
  Administered 2018-06-15: 0.4 mg via INTRAVENOUS

## 2018-06-15 MED ORDER — TECHNETIUM TC 99M TETROFOSMIN IV KIT
10.4000 | PACK | Freq: Once | INTRAVENOUS | Status: AC | PRN
  Administered 2018-06-15: 10.4 via INTRAVENOUS

## 2018-06-15 MED ORDER — TECHNETIUM TC 99M TETROFOSMIN IV KIT
31.9000 | PACK | Freq: Once | INTRAVENOUS | Status: AC | PRN
  Administered 2018-06-15: 31.9 via INTRAVENOUS

## 2018-06-15 NOTE — Progress Notes (Signed)
*  PRELIMINARY RESULTS* Echocardiogram 2D Echocardiogram has been performed.  [name] 06/15/2018, 10:56 AM

## 2018-06-18 ENCOUNTER — Ambulatory Visit: Payer: Medicare Other | Admitting: Student in an Organized Health Care Education/Training Program

## 2018-06-18 ENCOUNTER — Telehealth: Payer: Self-pay | Admitting: Cardiovascular Disease

## 2018-06-18 NOTE — Telephone Encounter (Signed)
I left a message for the patient to call back regarding her echo/ stress test results.   Echo: Notes recorded by Minna Merritts, MD on 06/17/2018 at 8:28 PM EST Echo and stress look normal Would she reconsider starting a low dose B-blocker for tachycardia, rates in the 130s (persistent) Will cc Dr. Caryl Comes, electrophysiology, to review the EKGs, unable to exclude atrial tachycardia, all through 2019, and EKG in 2014 She can proceed with lower extremity angiography  Myoview: Notes recorded by Minna Merritts, MD on 06/16/2018 at 4:32 PM EDT Stress test  The study is normal.  This is a low risk study.  The left ventricular ejection fraction is normal (55-65%).

## 2018-06-19 ENCOUNTER — Other Ambulatory Visit: Payer: Self-pay

## 2018-06-19 ENCOUNTER — Encounter: Payer: Self-pay | Admitting: Student in an Organized Health Care Education/Training Program

## 2018-06-19 ENCOUNTER — Ambulatory Visit (HOSPITAL_BASED_OUTPATIENT_CLINIC_OR_DEPARTMENT_OTHER): Payer: Medicare Other | Admitting: Student in an Organized Health Care Education/Training Program

## 2018-06-19 VITALS — BP 151/76 | HR 132 | Temp 97.6°F | Resp 18 | Ht 61.0 in | Wt 114.0 lb

## 2018-06-19 DIAGNOSIS — H547 Unspecified visual loss: Secondary | ICD-10-CM | POA: Diagnosis not present

## 2018-06-19 DIAGNOSIS — Z9889 Other specified postprocedural states: Secondary | ICD-10-CM | POA: Diagnosis not present

## 2018-06-19 DIAGNOSIS — Z888 Allergy status to other drugs, medicaments and biological substances status: Secondary | ICD-10-CM | POA: Diagnosis not present

## 2018-06-19 DIAGNOSIS — Z882 Allergy status to sulfonamides status: Secondary | ICD-10-CM | POA: Diagnosis not present

## 2018-06-19 DIAGNOSIS — M47816 Spondylosis without myelopathy or radiculopathy, lumbar region: Secondary | ICD-10-CM | POA: Diagnosis not present

## 2018-06-19 DIAGNOSIS — R9431 Abnormal electrocardiogram [ECG] [EKG]: Secondary | ICD-10-CM | POA: Diagnosis not present

## 2018-06-19 DIAGNOSIS — I10 Essential (primary) hypertension: Secondary | ICD-10-CM | POA: Diagnosis not present

## 2018-06-19 DIAGNOSIS — Z8679 Personal history of other diseases of the circulatory system: Secondary | ICD-10-CM | POA: Diagnosis not present

## 2018-06-19 DIAGNOSIS — Z8674 Personal history of sudden cardiac arrest: Secondary | ICD-10-CM | POA: Diagnosis not present

## 2018-06-19 DIAGNOSIS — G894 Chronic pain syndrome: Secondary | ICD-10-CM

## 2018-06-19 DIAGNOSIS — Z90710 Acquired absence of both cervix and uterus: Secondary | ICD-10-CM | POA: Diagnosis not present

## 2018-06-19 DIAGNOSIS — Z881 Allergy status to other antibiotic agents status: Secondary | ICD-10-CM | POA: Diagnosis not present

## 2018-06-19 DIAGNOSIS — J449 Chronic obstructive pulmonary disease, unspecified: Secondary | ICD-10-CM | POA: Diagnosis not present

## 2018-06-19 DIAGNOSIS — Z9981 Dependence on supplemental oxygen: Secondary | ICD-10-CM | POA: Diagnosis not present

## 2018-06-19 DIAGNOSIS — Z8673 Personal history of transient ischemic attack (TIA), and cerebral infarction without residual deficits: Secondary | ICD-10-CM | POA: Diagnosis not present

## 2018-06-19 DIAGNOSIS — R000 Tachycardia, unspecified: Secondary | ICD-10-CM | POA: Diagnosis not present

## 2018-06-19 DIAGNOSIS — Z886 Allergy status to analgesic agent status: Secondary | ICD-10-CM | POA: Diagnosis not present

## 2018-06-19 DIAGNOSIS — Z8619 Personal history of other infectious and parasitic diseases: Secondary | ICD-10-CM | POA: Diagnosis not present

## 2018-06-19 DIAGNOSIS — Z79899 Other long term (current) drug therapy: Secondary | ICD-10-CM | POA: Diagnosis not present

## 2018-06-19 DIAGNOSIS — M47814 Spondylosis without myelopathy or radiculopathy, thoracic region: Secondary | ICD-10-CM | POA: Diagnosis not present

## 2018-06-19 DIAGNOSIS — I6523 Occlusion and stenosis of bilateral carotid arteries: Secondary | ICD-10-CM | POA: Diagnosis not present

## 2018-06-19 DIAGNOSIS — Z885 Allergy status to narcotic agent status: Secondary | ICD-10-CM | POA: Diagnosis not present

## 2018-06-19 DIAGNOSIS — M419 Scoliosis, unspecified: Secondary | ICD-10-CM | POA: Diagnosis not present

## 2018-06-19 DIAGNOSIS — Z8249 Family history of ischemic heart disease and other diseases of the circulatory system: Secondary | ICD-10-CM | POA: Diagnosis not present

## 2018-06-19 DIAGNOSIS — I70213 Atherosclerosis of native arteries of extremities with intermittent claudication, bilateral legs: Secondary | ICD-10-CM | POA: Diagnosis not present

## 2018-06-19 DIAGNOSIS — F17200 Nicotine dependence, unspecified, uncomplicated: Secondary | ICD-10-CM | POA: Diagnosis not present

## 2018-06-19 MED ORDER — OXYCODONE HCL 5 MG PO TABS: 5.0000 mg | ORAL_TABLET | Freq: Three times a day (TID) | ORAL | 0 refills | Status: DC | PRN

## 2018-06-19 NOTE — Progress Notes (Signed)
Safety precautions to be maintained throughout the outpatient stay will include: orient to surroundings, keep bed in low position, maintain call bell within reach at all times, provide assistance with transfer out of bed and ambulation.  

## 2018-06-19 NOTE — Patient Instructions (Signed)
You will bring back your pills for count then given your prescription.

## 2018-06-19 NOTE — Progress Notes (Signed)
Patient's Name: Savannah Mcclure  MRN: [id number]  Referring Provider: [name], DO  DOB: Aug 02, 1941  PCP: [name], DO  DOS: 06/19/2018  Note by: Gillis Santa, MD  Service setting: Ambulatory outpatient  Specialty: Interventional Pain Management  Location: [facility] (AMB) Pain Management Facility    Patient type: Established   Primary Reason(s) for Visit: Encounter for post-procedure evaluation of chronic illness with mild to moderate exacerbation CC: Back Pain (mid)  HPI  [name] is a 77 y.o. year old, female patient, who comes today for a post-procedure evaluation. She has HTN (hypertension); COPD (chronic obstructive pulmonary disease) ([place]); History of tobacco abuse; Scoliosis (and kyphoscoliosis), idiopathic; Spinal stenosis of lumbar region with neurogenic claudication; PAD (peripheral artery disease) ([place]); Skin lesions; Vision loss; Sinus tachycardia; Anxiety; Anemia; Hypokalemia; Abnormal weight loss; Controlled substance agreement signed; Lumbar degenerative disc disease; Chronic, continuous use of opioids; Chronic left shoulder pain; Pain of both hip joints; Chronic pain syndrome; Atherosclerosis of native arteries of extremity with intermittent claudication ([place]); Carotid stenosis; Spondylosis without myelopathy or radiculopathy, lumbar region; and Thoracic spondylosis without myelopathy on their problem list. Her primarily concern today is the Back Pain (mid)  Pain Assessment: Location: Mid Back Radiating: denies Onset: More than a month ago Duration: Chronic pain Quality: Aching, Constant, Sharp, Spasm Severity: 2 /10 (subjective, self-reported pain score)  Note: Reported level is compatible with observation.                         When using our objective Pain Scale, levels between 6 and 10/10 are said to belong in an emergency room, as it progressively worsens from a 6/10, described as severely limiting, requiring emergency care not usually available at  an outpatient pain management facility. At a 6/10 level, communication becomes difficult and requires great effort. Assistance to reach the emergency department may be required. Facial flushing and profuse sweating along with potentially dangerous increases in heart rate and blood pressure will be evident. Effect on ADL: Limits activities Timing: Constant Modifying factors: lying down BP: (!) 151/76  HR: (!) 132  [name] comes in today for post-procedure evaluation.  Further details on both, my assessment(s), as well as the proposed treatment plan, please see below.  Post-Procedure Assessment  05/07/2018 Procedure: T12, L1, L2 RFA Pre-procedure pain score:        /10 Post-procedure pain score: 0/10         Influential Factors: BMI: 21.54 kg/m Intra-procedural challenges: None observed.         Assessment challenges: None detected.              Reported side-effects: None.        Post-procedural adverse reactions or complications: None reported         Sedation: Please see nurses note. When no sedatives are used, the analgesic levels obtained are directly associated to the effectiveness of the local anesthetics. However, when sedation is provided, the level of analgesia obtained during the initial 1 hour following the intervention, is believed to be the result of a combination of factors. These factors may include, but are not limited to: 1. The effectiveness of the local anesthetics used. 2. The effects of the analgesic(s) and/or anxiolytic(s) used. 3. The degree of discomfort experienced by the patient at the time of the procedure. 4. The patients ability and reliability in recalling and recording the events. 5. The presence and influence of possible secondary gains and/or psychosocial factors.  Reported result: Relief experienced during the 1st hour after the procedure: 100 % (Ultra-Short Term Relief)            Interpretative annotation: Clinically appropriate result.  Analgesia during this period is likely to be Local Anesthetic and/or IV Sedative (Analgesic/Anxiolytic) related.          Effects of local anesthetic: The analgesic effects attained during this period are directly associated to the localized infiltration of local anesthetics and therefore cary significant diagnostic value as to the etiological location, or anatomical origin, of the pain. Expected duration of relief is directly dependent on the pharmacodynamics of the local anesthetic used. Long-acting (4-6 hours) anesthetics used.  Reported result: Relief during the next 4 to 6 hour after the procedure: 100 % (Short-Term Relief)            Interpretative annotation: Clinically appropriate result. Analgesia during this period is likely to be Local Anesthetic-related.          Long-term benefit: Defined as the period of time past the expected duration of local anesthetics (1 hour for short-acting and 4-6 hours for long-acting). With the possible exception of prolonged sympathetic blockade from the local anesthetics, benefits during this period are typically attributed to, or associated with, other factors such as analgesic sensory neuropraxia, antiinflammatory effects, or beneficial biochemical changes provided by agents other than the local anesthetics.  Reported result: Extended relief following procedure: 50 % (Long-Term Relief)            Interpretative annotation: Clinically possible results. Good relief. No permanent benefit expected. Inflammation plays a part in the etiology to the pain.          Current benefits: Defined as reported results that persistent at this point in time.   Analgesia: 50 % [name] reports improvement of axial symptoms. Function: Somewhat improved ROM: Somewhat improved Interpretative annotation: Ongoing benefit. Therapeutic benefit observed. Adequate RF ablation.          Interpretation: Results would suggest adequate radiofrequency ablation.                   Plan:  Please see "Plan of Care" for details.                Laboratory Chemistry  Inflammation Markers (CRP: Acute Phase) (ESR: Chronic Phase) Lab Results  Component Value Date   LATICACIDVEN 1.8 10/09/2017                         Rheumatology Markers No results found for: RF, ANA, LABURIC, URICUR, LYMEIGGIGMAB, LYMEABIGMQN, HLAB27                      Renal Function Markers Lab Results  Component Value Date   BUN 11 06/05/2018   CREATININE 0.72 06/05/2018   BCR 16 10/30/2017   GFRAA >60 06/05/2018   GFRNONAA >60 06/05/2018                             Hepatic Function Markers Lab Results  Component Value Date   AST 13 10/30/2017   ALT 9 10/30/2017   ALBUMIN 4.4 10/30/2017   ALKPHOS 82 10/30/2017                        Electrolytes Lab Results  Component Value Date   NA 137 10/31/2017   K 3.4 (L) 10/31/2017  CL 101 10/31/2017   CALCIUM 8.9 10/31/2017   MG 1.8 10/13/2017   PHOS 3.2 10/13/2017                        Neuropathy Markers No results found for: VITAMINB12, FOLATE, HGBA1C, HIV                      CNS Tests No results found for: COLORCSF, APPEARCSF, RBCCOUNTCSF, WBCCSF, POLYSCSF, LYMPHSCSF, EOSCSF, PROTEINCSF, GLUCCSF, JCVIRUS, CSFOLI, IGGCSF                      Bone Pathology Markers No results found for: VD25OH, [id number], [id number], [id number], 25OHVITD1, 25OHVITD2, 25OHVITD3, TESTOFREE, TESTOSTERONE                       Coagulation Parameters Lab Results  Component Value Date   INR 1.07 10/07/2017   LABPROT 13.8 10/07/2017   PLT 412 10/31/2017                        Cardiovascular Markers Lab Results  Component Value Date   BNP 326 (H) 09/10/2012   CKTOTAL 65 09/10/2012   CKMB < 0.5 (L) 09/10/2012   TROPONINI <0.03 10/31/2017   HGB 11.1 (L) 10/31/2017   HCT 33.8 (L) 10/31/2017                         CA Markers No results found for: CEA, CA125, LABCA2                      Note: Lab results reviewed.  Recent Diagnostic  Imaging Results  NM Myocar Multi W/Spect W/Wall Motion / EF  The study is normal.  This is a low risk study.  The left ventricular ejection fraction is normal (55-65%).   ECHOCARDIOGRAM COMPLETE                 *[place], [place] [address]                            [phone]  ------------------------------------------------------------------- Transthoracic Echocardiography  Patient:    Mcclure, Kyrstal MR #:       [id number] Study Date: 06/15/2018 Gender:     F Age:        13 Height:     154.9 cm Weight:     51.7 kg BSA:        1.5 m^2 Pt. Status: Room:   REFERRING    Esmond Plants, MD, [facility]  ATTENDING    [place], Tim  Dannielle Karvonen, Tim  REFERRING    [place], Tim  SONOGRAPHER  [name] RDCS  PERFORMING   [facility], [facility]  cc:  ------------------------------------------------------------------- LV EF: 60% -   65%  ------------------------------------------------------------------- Indications:      Sinus tachycardia, abnormal ECG.  ------------------------------------------------------------------- History:   PMH:  Aneurysm, cardiac arrest, leaky heart valve, PVD, tachycardia.  Stroke.  Chronic obstructive pulmonary disease.  ------------------------------------------------------------------- Study Conclusions  - Left ventricle: The  cavity size was normal. There was mild   concentric hypertrophy. Systolic function was normal. The   estimated ejection fraction was in the range of 60% to 65%.   Images were inadequate for LV wall motion assessment. The study   is not technically sufficient to allow evaluation of LV diastolic   function. - Pulmonary arteries: Systolic pressure could not be accurately   estimated.  Impressions:  - The patient is tachycardiac during the study (133  bpm).  ------------------------------------------------------------------- Study data:   Study status:  Routine.  Procedure:  The patient reported no pain pre or post test. Transthoracic echocardiography for left ventricular function evaluation, for right ventricular function evaluation, and for assessment of valvular function. Image quality was suboptimal. The study was technically difficult, as a result of body habitus.  Study completion:  There were no complications.          Transthoracic echocardiography.  M-mode, complete 2D, spectral Doppler, and color Doppler.  Birthdate: Patient birthdate: 1941/01/19.  Age:  Patient is 77 yr old.  Sex: Gender: female.    BMI: 21.6 kg/m^2.  Blood pressure:     92/52 Patient status:  Outpatient.  Study date:  Study date: 06/15/2018. Study time: 10:23 AM.  Location:  Echo laboratory.  -------------------------------------------------------------------  ------------------------------------------------------------------- Left ventricle:  The cavity size was normal. There was mild concentric hypertrophy. Systolic function was normal. The estimated ejection fraction was in the range of 60% to 65%. Images were inadequate for LV wall motion assessment. The study is not technically sufficient to allow evaluation of LV diastolic function.  ------------------------------------------------------------------- Aortic valve:   Trileaflet; normal thickness, mildly calcified leaflets. Mobility was not restricted.  Doppler:  Transvalvular velocity was within the normal range. There was no stenosis. There was no regurgitation.    VTI ratio of LVOT to aortic valve: 1.11. Valve area (VTI): 3.49 cm^2. Indexed valve area (VTI): 2.33 cm^2/m^2. Peak velocity ratio of LVOT to aortic valve: 0.98. Valve area (Vmax): 3.08 cm^2. Indexed valve area (Vmax): 2.06 cm^2/m^2. Mean velocity ratio of LVOT to aortic valve: 1.04. Valve area (Vmean): 3.26 cm^2. Indexed valve area  (Vmean): 2.18 cm^2/m^2. Mean gradient (S): 3 mm Hg. Peak gradient (S): 5 mm Hg.  ------------------------------------------------------------------- Aorta:  Aortic root: The aortic root was normal in size.  ------------------------------------------------------------------- Mitral valve:   Structurally normal valve.   Mobility was not restricted.  Doppler:  Transvalvular velocity was within the normal range. There was no evidence for stenosis. There was no regurgitation.    Valve area by pressure half-time: 9.17 cm^2. Indexed valve area by pressure half-time: 6.13 cm^2/m^2.  ------------------------------------------------------------------- Left atrium:  The atrium was normal in size.  ------------------------------------------------------------------- Right ventricle:  The cavity size was normal. Wall thickness was normal. Systolic function was normal.  ------------------------------------------------------------------- Pulmonic valve:    Doppler:  Transvalvular velocity was within the normal range. There was no evidence for stenosis.  ------------------------------------------------------------------- Tricuspid valve:   Structurally normal valve.    Doppler: Transvalvular velocity was within the normal range. There was no regurgitation.  ------------------------------------------------------------------- Pulmonary artery:   The main pulmonary artery was normal-sized. Systolic pressure could not be accurately estimated.  ------------------------------------------------------------------- Right atrium:  The atrium was normal in size.  ------------------------------------------------------------------- Pericardium:  There was no pericardial effusion.  ------------------------------------------------------------------- Systemic veins: Inferior vena cava: The vessel was normal in size.  ------------------------------------------------------------------- Measurements   Left  ventricle  Value          Reference  LV ID, ED, PLAX chordal          (L)     33.9  mm       43 - 52  LV ID, ES, PLAX chordal          (L)     19.3  mm       23 - 38  LV fx shortening, PLAX chordal           43    %        >=29  LV PW thickness, ED                      10.8  mm       ----------  IVS/LV PW ratio, ED                      1.08           <=1.3  Stroke volume, 2D                        47    ml       ----------  Stroke volume/bsa, 2D                    31    ml/m^2   ----------  LV e&', lateral                           9.03  cm/s     ----------  LV E/e&', lateral                         6.5            ----------  LV e&', medial                            6.64  cm/s     ----------  LV E/e&', medial                          8.84           ----------  LV e&', average                           7.84  cm/s     ----------  LV E/e&', average                         7.49           ----------    Ventricular septum                       Value          Reference  IVS thickness, ED                        11.7  mm       ----------    LVOT  Value          Reference  LVOT ID, S                               20    mm       ----------  LVOT area                                3.14  cm^2     ----------  LVOT ID                                  20    mm       ----------  LVOT peak velocity, S                    106   cm/s     ----------  LVOT mean velocity, S                    73.5  cm/s     ----------  LVOT VTI, S                              15    cm       ----------  Stroke volume (SV), LVOT DP              47.1  ml       ----------  Stroke index (SV/bsa), LVOT DP           31.5  ml/m^2   ----------    Aortic valve                             Value          Reference  Aortic valve peak velocity, S            108   cm/s     ----------  Aortic valve mean velocity, S            70.8  cm/s     ----------  Aortic valve VTI, S                       13.5  cm       ----------  Aortic mean gradient, S                  3     mm Hg    ----------  Aortic peak gradient, S                  5     mm Hg    ----------  VTI ratio, LVOT/AV                       1.11           ----------  Aortic valve area, VTI                   3.49  cm^2     ----------  Aortic valve area/bsa, VTI               2.33  cm^2/m^2 ----------  Velocity  ratio, peak, LVOT/AV            0.98           ----------  Aortic valve area, peak velocity         3.08  cm^2     ----------  Aortic valve area/bsa, peak              2.06  cm^2/m^2 ----------  velocity  Velocity ratio, mean, LVOT/AV            1.04           ----------  Aortic valve area, mean velocity         3.26  cm^2     ----------  Aortic valve area/bsa, mean              2.18  cm^2/m^2 ----------  velocity    Aorta                                    Value          Reference  Aortic root ID, ED                       25    mm       ----------    Left atrium                              Value          Reference  LA ID, A-P, ES                           23    mm       ----------  LA ID/bsa, A-P                           1.54  cm/m^2   <=2.2  LA volume, ES, 1-p A4C                   15.3  ml       ----------  LA volume/bsa, ES, 1-p A4C               10.2  ml/m^2   ----------  LA volume, ES, 1-p A2C                   28.1  ml       ----------  LA volume/bsa, ES, 1-p A2C               18.8  ml/m^2   ----------    Mitral valve                             Value          Reference  Mitral E-wave peak velocity              58.7  cm/s     ----------  Mitral A-wave peak velocity              113   cm/s     ----------  Mitral deceleration time         (L)     81    ms  150 - 230  Mitral pressure half-time                24    ms       ----------  Mitral E/A ratio, peak                   0.5            ----------  Mitral valve area, PHT, DP               9.17  cm^2     ----------  Mitral valve area/bsa,  PHT, DP           6.13  cm^2/m^2 ----------    Right atrium                             Value          Reference  RA ID, S-I, ES, A4C                      35.3  mm       34 - 49  RA area, ES, A4C                         10.2  cm^2     8.3 - 19.5  RA volume, ES, A/L                       22.7  ml       ----------  RA volume/bsa, ES, A/L                   15.2  ml/m^2   ----------    Right ventricle                          Value          Reference  RV ID, ED, PLAX                          30.4  mm       19 - 38  RV ID, minor axis, ED, A4C base          28.1  mm       ----------  RV s&', lateral, S                        15.6  cm/s     ----------    Pulmonic valve                           Value          Reference  Pulmonic valve peak velocity, S          71.3  cm/s     ----------  Legend: (L)  and  (H)  mark values outside specified reference range.  ------------------------------------------------------------------- Prepared and Electronically Authenticated by  Kathlyn Sacramento, MD 2019-11-01T13:28:42  Complexity Note: Imaging results reviewed. Results shared with [name], using Layman's terms.                         Meds   Current Outpatient Medications:  .  B-COMPLEX-C PO, Take 1 tablet by mouth daily., Disp: ,  Rfl:  .  cilostazol (PLETAL) 100 MG tablet, Take 1 tablet (100 mg total) by mouth 2 (two) times daily., Disp: 180 tablet, Rfl: 1 .  cyclobenzaprine (FLEXERIL) 5 MG tablet, Take 1 tablet (5 mg total) by mouth 2 (two) times daily as needed for muscle spasms., Disp: 60 tablet, Rfl: 2 .  gabapentin (NEURONTIN) 300 MG capsule, Take 1 capsule (300 mg total) by mouth 2 (two) times daily., Disp: 60 capsule, Rfl: 3 .  hydrOXYzine (ATARAX/VISTARIL) 25 MG tablet, Take 1 tablet (25 mg total) by mouth 3 (three) times daily as needed., Disp: 270 tablet, Rfl: 3 .  ipratropium-albuterol (DUONEB) 0.5-2.5 (3) MG/3ML SOLN, Take 3 mLs by nebulization every 6 (six) hours as needed.,  Disp: 360 mL, Rfl: 6 .  lidocaine (LIDODERM) 5 %, Place 1 patch onto the skin daily. Remove & Discard patch within 12 hours or as directed by MD, Disp: 60 patch, Rfl: 6 .  MAGNESIUM CHLORIDE PO, Take 1 capsule by mouth daily., Disp: , Rfl:  .  montelukast (SINGULAIR) 10 MG tablet, Take 1 tablet (10 mg total) by mouth at bedtime., Disp: 90 tablet, Rfl: 3 .  Multiple Vitamins-Minerals (MULTIVITAMIN WITH MINERALS) tablet, Take 1 tablet by mouth daily., Disp: , Rfl:  .  NIACINAMIDE PO, Take 1 capsule by mouth daily., Disp: , Rfl:  .  oxyCODONE (OXY IR/ROXICODONE) 5 MG immediate release tablet, Take 1 tablet (5 mg total) by mouth 3 (three) times daily as needed for severe pain. For chronic pain, Disp: 90 tablet, Rfl: 0 .  POTASSIUM CITRATE PO, Take 1 capsule by mouth daily., Disp: , Rfl:  .  tiotropium (SPIRIVA HANDIHALER) 18 MCG inhalation capsule, Place 1 capsule (18 mcg total) into inhaler and inhale daily., Disp: 90 capsule, Rfl: 3 .  UNABLE TO FIND, Take 1 tablet by mouth 2 (two) times daily. Cardio FX, Disp: , Rfl:  .  UNABLE TO FIND, Take 1 tablet by mouth daily. Cinnergy, Disp: , Rfl:  .  UNABLE TO FIND, Take 2 tablets by mouth daily. Joint FX, Disp: , Rfl:  .  triamcinolone ointment (KENALOG) 0.5 %, Apply 1 application topically 2 (two) times daily. (Patient not taking: Reported on 06/19/2018), Disp: 30 g, Rfl: 0  Current Facility-Administered Medications:  .  albuterol (PROVENTIL) (2.5 MG/3ML) 0.083% nebulizer solution 2.5 mg, 2.5 mg, Nebulization, Once, Johnson, Megan P, DO  ROS  Constitutional: Denies any fever or chills Gastrointestinal: No reported hemesis, hematochezia, vomiting, or acute GI distress Musculoskeletal: Denies any acute onset joint swelling, redness, loss of ROM, or weakness Neurological: No reported episodes of acute onset apraxia, aphasia, dysarthria, agnosia, amnesia, paralysis, loss of coordination, or loss of consciousness  Allergies  [name] is allergic  to other; amlodipine; azithromycin; doxycycline; erythromycin; hydrocodone; lactose intolerance (gi); minocycline; nsaids; relafen [nabumetone]; sulfa antibiotics; and tetracyclines & related.  PFSH  Drug: [name]  reports that she does not use drugs. Alcohol:  reports that she drinks alcohol. Tobacco:  reports that she has been smoking. She has a 30.00 pack-year smoking history. She has quit using smokeless tobacco. Medical:  has a past medical history of Aneurysm ([place]), Anxiety, Blind right eye, Cardiac arrest ([place]), Cataract, Claudication ([place]), COPD (chronic obstructive pulmonary disease) ([place]), Dyspnea, Heart murmur, Hypertension, Leaky heart valve, Oxygen deficit, Peripheral vascular disease ([place]), Scoliosis, Sepsis ([place]), Spinal stenosis, Stroke ([place]), and Tachycardia. Surgical: [name]  has a past surgical history that includes right eye surgery; Wisdom tooth extraction; Back surgery; Abdominal hysterectomy; and Breast  lumpectomy (Left). Family: family history includes Heart disease in her paternal grandfather and paternal grandmother; Heart failure in her father; Hypertension in her mother; Intracerebral hemorrhage in her mother; Kidney disease in her maternal grandmother; Rheum arthritis in her maternal grandmother; Schizophrenia in her daughter; Stroke in her paternal grandfather and paternal grandmother.  Constitutional Exam  General appearance: Well nourished, well developed, and well hydrated. In no apparent acute distress Vitals:   06/19/18 1156  BP: (!) 151/76  Pulse: (!) 132  Resp: 18  Temp: 97.6 F (36.4 C)  SpO2: 100%  Weight: 114 lb (51.7 kg)  Height: _0  (1.549 m)   BMI Assessment: Estimated body mass index is 21.54 kg/m as calculated from the following:   Height as of this encounter: _1  (1.549 m).   Weight as of this encounter: 114 lb (51.7 kg).  BMI interpretation table: BMI level Category Range association with higher incidence of chronic  pain  <18 kg/m2 Underweight   18.5-24.9 kg/m2 Ideal body weight   25-29.9 kg/m2 Overweight Increased incidence by 20%  30-34.9 kg/m2 Obese (Class I) Increased incidence by 68%  35-39.9 kg/m2 Severe obesity (Class II) Increased incidence by 136%  >40 kg/m2 Extreme obesity (Class III) Increased incidence by 254%   Patient's current BMI Ideal Body weight  Body mass index is 21.54 kg/m. Ideal body weight: 47.8 kg (105 lb 6.1 oz) Adjusted ideal body weight: 49.4 kg (108 lb 13.2 oz)   BMI Readings from Last 4 Encounters:  06/19/18 21.54 kg/m  06/11/18 21.54 kg/m  06/05/18 21.16 kg/m  06/05/18 21.35 kg/m   Wt Readings from Last 4 Encounters:  06/19/18 114 lb (51.7 kg)  06/11/18 114 lb (51.7 kg)  06/05/18 112 lb (50.8 kg)  06/05/18 113 lb (51.3 kg)  Psych/Mental status: Alert, oriented x 3 (person, place, & time)       Eyes: PERLA Respiratory: No evidence of acute respiratory distress   Upper Extremity (UE) Exam    Side: Right upper extremity  Side: Left upper extremity  Skin & Extremity Inspection: Skin color, temperature, and hair growth are WNL. No peripheral edema or cyanosis. No masses, redness, swelling, asymmetry, or associated skin lesions. No contractures.  Skin & Extremity Inspection: Skin color, temperature, and hair growth are WNL. No peripheral edema or cyanosis. No masses, redness, swelling, asymmetry, or associated skin lesions. No contractures.  Functional ROM: Unrestricted ROM          Functional ROM: Unrestricted ROM          Muscle Tone/Strength: Functionally intact. No obvious neuro-muscular anomalies detected.  Muscle Tone/Strength: Functionally intact. No obvious neuro-muscular anomalies detected.  Sensory (Neurological): Unimpaired          Sensory (Neurological): Unimpaired          Palpation: No palpable anomalies              Palpation: No palpable anomalies              Provocative Test(s):  Phalen's test: deferred Tinel's test: deferred Apley's scratch  test (touch opposite shoulder):  Action 1 (Across chest): deferred Action 2 (Overhead): deferred Action 3 (LB reach): deferred   Provocative Test(s):  Phalen's test: deferred Tinel's test: deferred Apley's scratch test (touch opposite shoulder):  Action 1 (Across chest): deferred Action 2 (Overhead): deferred Action 3 (LB reach): deferred    Thoracic Spine Area Exam  Skin & Axial Inspection: Significant thoracic kyphosis Alignment: Asymmetric Functional ROM: Improved after treatment Stability: No instability detected  Muscle Tone/Strength: Functionally intact. No obvious neuro-muscular anomalies detected. Sensory (Neurological): Articular pain pattern Muscle strength & Tone: No palpable anomalies  Lumbar Spine Area Exam  Skin & Axial Inspection: Lumbar Scoliosis Alignment: Scoliosis detected Functional ROM: Decreased ROM       Stability: No instability detected Muscle Tone/Strength: Functionally intact. No obvious neuro-muscular anomalies detected. Sensory (Neurological): Articular pain pattern Palpation: No palpable anomalies       Provocative Tests: Hyperextension/rotation test: deferred today       Lumbar quadrant test (Kemp's test): deferred today       Lateral bending test: deferred today       Patrick's Maneuver: deferred today                   FABER test: deferred today                   S-I anterior distraction/compression test: deferred today         S-I lateral compression test: deferred today         S-I Thigh-thrust test: deferred today         S-I Gaenslen's test: deferred today          Gait & Posture Assessment  Ambulation: Unassisted Gait: Relatively normal for age and body habitus Posture: Difficulty standing up straight, due to pain   Lower Extremity Exam    Side: Right lower extremity  Side: Left lower extremity  Stability: No instability observed          Stability: No instability observed          Skin & Extremity Inspection: Skin color,  temperature, and hair growth are WNL. No peripheral edema or cyanosis. No masses, redness, swelling, asymmetry, or associated skin lesions. No contractures.  Skin & Extremity Inspection: Skin color, temperature, and hair growth are WNL. No peripheral edema or cyanosis. No masses, redness, swelling, asymmetry, or associated skin lesions. No contractures.  Functional ROM: Unrestricted ROM                  Functional ROM: Unrestricted ROM                  Muscle Tone/Strength: Functionally intact. No obvious neuro-muscular anomalies detected.  Muscle Tone/Strength: Functionally intact. No obvious neuro-muscular anomalies detected.  Sensory (Neurological): Unimpaired  Sensory (Neurological): Unimpaired  Palpation: No palpable anomalies  Palpation: No palpable anomalies   Assessment  Primary Diagnosis & Pertinent Problem List: The primary encounter diagnosis was Spondylosis without myelopathy or radiculopathy, lumbar region. Diagnoses of Thoracic spondylosis without myelopathy and Chronic pain syndrome were also pertinent to this visit.  Status Diagnosis  Responding Responding Controlled 1. Spondylosis without myelopathy or radiculopathy, lumbar region   2. Thoracic spondylosis without myelopathy   3. Chronic pain syndrome     Problems updated and reviewed during this visit: Problem  Spondylosis Without Myelopathy Or Radiculopathy, Lumbar Region  Thoracic Spondylosis Without Myelopathy  General Recommendations: The pain condition that the patient suffers from is best treated with a multidisciplinary approach that involves an increase in physical activity to prevent de-conditioning and worsening of the pain cycle, as well as psychological counseling (formal and/or informal) to address the co-morbid psychological affects of pain. Treatment will often involve judicious use of pain medications and interventional procedures to decrease the pain, allowing the patient to participate in the physical  activity that will ultimately produce long-lasting pain reductions. The goal of the multidisciplinary approach is to return the patient to  a higher level of overall function and to restore their ability to perform activities of daily living.  77 year old female who follows up status post bilateral T12, L1, L2 radiofrequency ablation and is endorsing approximately 50 to 60% pain relief that is ongoing since her ablation (05/07/18).  She endorses improvement in range of motion and overall pain in her thoracic region.  She is pleased with the results.  We can repeat ablation as early as 6 months should she have return of pain at that time.  Otherwise we will refill the patient's oxycodone as below.  Her current prescription for 90 tablets usually lasts 4 to 5 months.  Note: [place] PMP checked and appropriate.  Plan of Care  Pharmacotherapy (Medications Ordered): Meds ordered this encounter  Medications  . oxyCODONE (OXY IR/ROXICODONE) 5 MG immediate release tablet    Sig: Take 1 tablet (5 mg total) by mouth 3 (three) times daily as needed for severe pain. For chronic pain    Dispense:  90 tablet    Refill:  0    Provider-requested follow-up: Return in about 6 months (around 12/18/2018) for Medication Management.  Time Note: Greater than 50% of the 25 minute(s) of face-to-face time spent with [name], was spent in counseling/coordination of care regarding: [name]'s primary cause of pain, the results of her recent test(s), the treatment plan, treatment alternatives, the results, interpretation and significance of  her recent diagnostic interventional treatment(s), the appropriate use of her medications, realistic expectations, the goals of pain management (increased in functionality), the medication agreement and the patient's responsibilities when it comes to controlled substances.  Future Appointments  Date Time Provider [place]  09/06/2018 10:30 AM [name], DO  [facility]  11/23/2018 11:00 AM [facility]FP NURSE HEALTH ADVISOR [facility]  11/27/2018 10:00 AM [facility] VASC 2 [facility] None  11/27/2018 11:00 AM Dew, Erskine Squibb, MD [facility] None  12/18/2018 10:30 AM Gillis Santa, MD [facility] None    Primary Care Physician: [name], DO Location: [facility] Note by: Gillis Santa, M.D Date: 06/19/2018; Time: 2:35 PM  Patient Instructions  You will bring back your pills for count then given your prescription.

## 2018-06-19 NOTE — Progress Notes (Signed)
Nursing Pain Medication Assessment:  Safety precautions to be maintained throughout the outpatient stay will include: orient to surroundings, keep bed in low position, maintain call bell within reach at all times, provide assistance with transfer out of bed and ambulation.  Medication Inspection Compliance: Savannah Mcclure did not comply with our request to bring her pills to be counted. She was reminded that bringing the medication bottles, even when empty, is a requirement.  Medication: None brought in. Pill/Patch Count: None available to be counted. Bottle Appearance: No container available. Did not bring bottle(s) to appointment. Filled Date: N/A Last Medication intake:  Today

## 2018-06-19 NOTE — Telephone Encounter (Signed)
Patient calling to discuss recent testing results  ° °Please call  ° °

## 2018-06-20 ENCOUNTER — Other Ambulatory Visit: Payer: Self-pay

## 2018-06-20 ENCOUNTER — Encounter: Payer: Self-pay | Admitting: *Deleted

## 2018-06-20 ENCOUNTER — Ambulatory Visit
Admission: RE | Admit: 2018-06-20 | Discharge: 2018-06-20 | Disposition: A | Discharge status: Home / Self Care | Payer: Medicare Other | Source: Ambulatory Visit | Attending: Vascular Surgery | Admitting: Vascular Surgery

## 2018-06-20 ENCOUNTER — Encounter
Admission: RE | Disposition: A | Discharge status: Home / Self Care | Payer: Self-pay | Source: Ambulatory Visit | Attending: Vascular Surgery

## 2018-06-20 DIAGNOSIS — I6523 Occlusion and stenosis of bilateral carotid arteries: Secondary | ICD-10-CM | POA: Insufficient documentation

## 2018-06-20 DIAGNOSIS — G894 Chronic pain syndrome: Secondary | ICD-10-CM | POA: Insufficient documentation

## 2018-06-20 DIAGNOSIS — Z8619 Personal history of other infectious and parasitic diseases: Secondary | ICD-10-CM | POA: Insufficient documentation

## 2018-06-20 DIAGNOSIS — Z885 Allergy status to narcotic agent status: Secondary | ICD-10-CM | POA: Insufficient documentation

## 2018-06-20 DIAGNOSIS — J449 Chronic obstructive pulmonary disease, unspecified: Secondary | ICD-10-CM | POA: Diagnosis not present

## 2018-06-20 DIAGNOSIS — Z9889 Other specified postprocedural states: Secondary | ICD-10-CM | POA: Insufficient documentation

## 2018-06-20 DIAGNOSIS — Z882 Allergy status to sulfonamides status: Secondary | ICD-10-CM | POA: Insufficient documentation

## 2018-06-20 DIAGNOSIS — Z8673 Personal history of transient ischemic attack (TIA), and cerebral infarction without residual deficits: Secondary | ICD-10-CM | POA: Insufficient documentation

## 2018-06-20 DIAGNOSIS — Z79899 Other long term (current) drug therapy: Secondary | ICD-10-CM | POA: Insufficient documentation

## 2018-06-20 DIAGNOSIS — H547 Unspecified visual loss: Secondary | ICD-10-CM | POA: Insufficient documentation

## 2018-06-20 DIAGNOSIS — R9431 Abnormal electrocardiogram [ECG] [EKG]: Secondary | ICD-10-CM | POA: Insufficient documentation

## 2018-06-20 DIAGNOSIS — I10 Essential (primary) hypertension: Secondary | ICD-10-CM | POA: Insufficient documentation

## 2018-06-20 DIAGNOSIS — Z833 Family history of diabetes mellitus: Secondary | ICD-10-CM | POA: Insufficient documentation

## 2018-06-20 DIAGNOSIS — Z8249 Family history of ischemic heart disease and other diseases of the circulatory system: Secondary | ICD-10-CM | POA: Insufficient documentation

## 2018-06-20 DIAGNOSIS — Z8674 Personal history of sudden cardiac arrest: Secondary | ICD-10-CM | POA: Insufficient documentation

## 2018-06-20 DIAGNOSIS — Z90710 Acquired absence of both cervix and uterus: Secondary | ICD-10-CM | POA: Insufficient documentation

## 2018-06-20 DIAGNOSIS — Z8679 Personal history of other diseases of the circulatory system: Secondary | ICD-10-CM | POA: Insufficient documentation

## 2018-06-20 DIAGNOSIS — I70213 Atherosclerosis of native arteries of extremities with intermittent claudication, bilateral legs: Secondary | ICD-10-CM | POA: Diagnosis not present

## 2018-06-20 DIAGNOSIS — Z886 Allergy status to analgesic agent status: Secondary | ICD-10-CM | POA: Insufficient documentation

## 2018-06-20 DIAGNOSIS — Z888 Allergy status to other drugs, medicaments and biological substances status: Secondary | ICD-10-CM | POA: Insufficient documentation

## 2018-06-20 DIAGNOSIS — Z9981 Dependence on supplemental oxygen: Secondary | ICD-10-CM | POA: Insufficient documentation

## 2018-06-20 DIAGNOSIS — Z881 Allergy status to other antibiotic agents status: Secondary | ICD-10-CM | POA: Insufficient documentation

## 2018-06-20 DIAGNOSIS — F17200 Nicotine dependence, unspecified, uncomplicated: Secondary | ICD-10-CM | POA: Insufficient documentation

## 2018-06-20 DIAGNOSIS — I70219 Atherosclerosis of native arteries of extremities with intermittent claudication, unspecified extremity: Secondary | ICD-10-CM

## 2018-06-20 DIAGNOSIS — R000 Tachycardia, unspecified: Secondary | ICD-10-CM | POA: Insufficient documentation

## 2018-06-20 DIAGNOSIS — M419 Scoliosis, unspecified: Secondary | ICD-10-CM | POA: Insufficient documentation

## 2018-06-20 HISTORY — PX: LOWER EXTREMITY ANGIOGRAPHY: CATH118251

## 2018-06-20 SURGERY — LOWER EXTREMITY ANGIOGRAPHY
Anesthesia: Moderate Sedation | Site: Leg Lower | Laterality: Right

## 2018-06-20 MED ORDER — CEFAZOLIN SODIUM-DEXTROSE 2-4 GM/100ML-% IV SOLN
INTRAVENOUS | Status: AC
  Administered 2018-06-20: 2 g via INTRAVENOUS
  Filled 2018-06-20: qty 100

## 2018-06-20 MED ORDER — MIDAZOLAM HCL 5 MG/5ML IJ SOLN
INTRAMUSCULAR | Status: AC
  Filled 2018-06-20: qty 5

## 2018-06-20 MED ORDER — LIDOCAINE-EPINEPHRINE (PF) 1 %-1:200000 IJ SOLN
INTRAMUSCULAR | Status: AC
  Filled 2018-06-20: qty 30

## 2018-06-20 MED ORDER — IOPAMIDOL (ISOVUE-300) INJECTION 61%
INTRAVENOUS | Status: DC | PRN
  Administered 2018-06-20: 80 mL via INTRA_ARTERIAL

## 2018-06-20 MED ORDER — FAMOTIDINE 20 MG PO TABS: 40.0000 mg | ORAL_TABLET | ORAL | Status: DC | PRN

## 2018-06-20 MED ORDER — FENTANYL CITRATE (PF) 100 MCG/2ML IJ SOLN
INTRAMUSCULAR | Status: DC | PRN
  Administered 2018-06-20 (×2): 50 ug via INTRAVENOUS

## 2018-06-20 MED ORDER — FENTANYL CITRATE (PF) 100 MCG/2ML IJ SOLN
INTRAMUSCULAR | Status: AC
  Filled 2018-06-20: qty 2

## 2018-06-20 MED ORDER — ONDANSETRON HCL 4 MG/2ML IJ SOLN: 4.0000 mg | Freq: Four times a day (QID) | INTRAMUSCULAR | Status: DC | PRN

## 2018-06-20 MED ORDER — HEPARIN SODIUM (PORCINE) 1000 UNIT/ML IJ SOLN
INTRAMUSCULAR | Status: AC
  Filled 2018-06-20: qty 1

## 2018-06-20 MED ORDER — MIDAZOLAM HCL 2 MG/2ML IJ SOLN
INTRAMUSCULAR | Status: DC | PRN
  Administered 2018-06-20: 1 mg via INTRAVENOUS
  Administered 2018-06-20: 2 mg via INTRAVENOUS

## 2018-06-20 MED ORDER — CEFAZOLIN SODIUM-DEXTROSE 2-4 GM/100ML-% IV SOLN
2.0000 g | Freq: Once | INTRAVENOUS | Status: AC
  Administered 2018-06-20: 2 g via INTRAVENOUS

## 2018-06-20 MED ORDER — SODIUM CHLORIDE 0.9 % IV SOLN
INTRAVENOUS | Status: DC
  Administered 2018-06-20: 1000 mL via INTRAVENOUS

## 2018-06-20 MED ORDER — HEPARIN SODIUM (PORCINE) 1000 UNIT/ML IJ SOLN
INTRAMUSCULAR | Status: DC | PRN
  Administered 2018-06-20: 2500 [IU] via INTRAVENOUS

## 2018-06-20 MED ORDER — HYDROMORPHONE HCL 1 MG/ML IJ SOLN: 1.0000 mg | Freq: Once | INTRAMUSCULAR | Status: DC | PRN

## 2018-06-20 MED ORDER — HEPARIN (PORCINE) IN NACL 1000-0.9 UT/500ML-% IV SOLN
INTRAVENOUS | Status: AC
  Filled 2018-06-20: qty 1000

## 2018-06-20 MED ORDER — METHYLPREDNISOLONE SODIUM SUCC 125 MG IJ SOLR: 125.0000 mg | INTRAMUSCULAR | Status: DC | PRN

## 2018-06-20 SURGICAL SUPPLY — 10 items
CANNULA 5F STIFF (CANNULA) ×3 IMPLANT
CATH BEACON 5 .035 40 KMP TP (CATHETERS) ×1 IMPLANT
CATH BEACON 5 .038 40 KMP TP (CATHETERS) ×2
CATH PIG 70CM (CATHETERS) ×3 IMPLANT
GLIDEWIRE ADV .035X180CM (WIRE) ×3 IMPLANT
PACK ANGIOGRAPHY (CUSTOM PROCEDURE TRAY) ×3 IMPLANT
SHEATH BRITE TIP 5FRX11 (SHEATH) ×6 IMPLANT
SYR MEDRAD MARK V 150ML (SYRINGE) ×3 IMPLANT
TUBING CONTRAST HIGH PRESS 72 (TUBING) ×3 IMPLANT
WIRE J 3MM .035X145CM (WIRE) ×3 IMPLANT

## 2018-06-20 NOTE — H&P (Signed)
[facility] History & Physical Update  The patient was interviewed and re-examined.  The patient's previous History and Physical has been reviewed and is unchanged.  There is no change in the plan of care. We plan to proceed with the scheduled procedure.  Leotis Pain, MD  06/20/2018, 12:34 PM

## 2018-06-20 NOTE — Telephone Encounter (Signed)
I attempted to reach the patient.  She is currently admitted for her procedure with Dr. Lucky Cowboy.  I advised on her voice mail that her results of both of her test were good aside from her tachycardia. I asked that she call back after her procedure if she would like to discuss starting medication for this.   Letter also mailed to the patient as well.

## 2018-06-20 NOTE — Op Note (Signed)
[facility]  Percutaneous Study/Intervention Procedural Note   Date of Surgery: 06/20/2018  Surgeon(s):DEW,JASON    Assistants:none  Pre-operative Diagnosis: PAD with claudication BLE  Post-operative diagnosis:  Same  Procedure(s) Performed:             1.  Ultrasound guidance for vascular access bilateral femoral arteries             2.  Catheter placement into aorta from bilateral femoral approaches             3.  Aortogram and selective bilateral lower extremity angiograms              EBL: 3 cc  Contrast: 80 cc  Fluoro Time: 8.3 minutes  Moderate Conscious Sedation Time: approximately 30 minutes using 3 mg of Versed and 100 mcg of Fentanyl              Indications:  Patient is a 77 y.o.female with disabling claudication to both legs. The patient has noninvasive study showing reduced ABIs bilaterally in the 0.6 range. The patient is brought in for angiography for further evaluation and potential treatment. Risks and benefits are discussed and informed consent is obtained.   Procedure:  The patient was identified and appropriate procedural time out was performed.  The patient was then placed supine on the table and prepped and draped in the usual sterile fashion. Moderate conscious sedation was administered during a face to face encounter with the patient throughout the procedure with my supervision of the RN administering medicines and monitoring the patient's vital signs, pulse oximetry, telemetry and mental status throughout from the start of the procedure until the patient was taken to the recovery room. Ultrasound was used to evaluate the left common femoral artery.  It was patent but very small and diseased.  A digital ultrasound image was acquired.  A puncture needle was used to access the left common femoral artery under direct ultrasound guidance and a permanent image was performed.  A 0.035 J wire was advanced and a 5Fr sheath was placed.   The  J-wire would not pass and an image through the left femoral sheath showed occlusion of the left iliac artery all the way down to the distal external iliac artery.  I used an advantage wire and a Kumpe catheter after giving the patient small dose of heparin was able to advance this up into the aorta but was never able to reenter the true lumen in the aorta and this appeared to be occluded.  Her aortic images showed only the false lumen and occlusion of the aorta and iliac arteries.  I did image the left lower extremity through the left femoral sheath.  This demonstrated a long segment left SFA occlusion with reconstitution of the above-knee popliteal artery.  There was some disease in the common femoral artery and it was quite small.  Of the profunda femoris artery appeared to have disease proximally but was reasonably well-maintained beyond the origin and provided the collateral flow distally.  The distal runoff was sluggish, but there appeared to be two-vessel runoff distally.  I then visualize the right common femoral artery.  Again this was small and diseased but did appear to have some flow.  It was accessed under direct ultrasound guidance with a micropuncture needle and a micropuncture wire and sheath were then placed.  A wire would not cross the iliac artery and imaging through the micropuncture sheath on the right showed similar long segment right  iliac occlusion with reconstitution of the distal right external iliac artery.  I upsized to a 5 [place] sheath on the right and used a Kumpe catheter and the advantage wire to navigate up into the aorta.  Similarly, I was never able to reenter the true lumen of the aorta and remained in the dissection plane in the aorta on the left lateral sidewall.  Imaging showed that we were not intraluminal, and at this point it was clear that no endovascular revascularization was going to be an option today.  I elected to terminate the procedure. The sheaths were removed and  pressure was held in both femoral arteries for 15 minutes.  Sterile dressing was placed and good hemostasis was achieved. The patient was taken to the recovery room in stable condition having tolerated the procedure well.  Findings:               Aortogram:  Aorta is occluded.  Intraluminal flow could not be demonstrated to evaluate the renal arteries and juxtarenal aorta.  The aorta and iliac arteries were highly calcific.  The occlusions continued down to the distal external iliac arteries.             Left lower Extremity:  This demonstrated a long segment left SFA occlusion with reconstitution of the above-knee popliteal artery.  There was some disease in the common femoral artery and it was quite small.  Of the profunda femoris artery appeared to have disease proximally but was reasonably well-maintained beyond the origin and provided the collateral flow distally.  The distal runoff was sluggish, but there appeared to be two-vessel runoff distally.  Right lower extremity: This demonstrated a flush occlusion of the right SFA.  Again, the common femoral artery was small and appeared somewhat diseased.  The profunda femoris artery provided collateral flow distally and there was reconstitution of the popliteal artery.  There was then one vessel runoff distally through the anterior tibial artery only.   Disposition: Patient was taken to the recovery room in stable condition having tolerated the procedure well.  Complications: None  Leotis Pain 06/20/2018 2:49 PM   This note was created with Dragon Medical transcription system. Any errors in dictation are purely unintentional.

## 2018-06-21 ENCOUNTER — Encounter: Payer: Self-pay | Admitting: Vascular Surgery

## 2018-06-21 ENCOUNTER — Telehealth (INDEPENDENT_AMBULATORY_CARE_PROVIDER_SITE_OTHER): Payer: Self-pay

## 2018-06-21 ENCOUNTER — Other Ambulatory Visit (INDEPENDENT_AMBULATORY_CARE_PROVIDER_SITE_OTHER): Payer: Self-pay | Admitting: Nurse Practitioner

## 2018-06-21 DIAGNOSIS — Z91041 Radiographic dye allergy status: Secondary | ICD-10-CM

## 2018-06-21 NOTE — Telephone Encounter (Signed)
This has been called into the patient's pharmacy.

## 2018-06-21 NOTE — Telephone Encounter (Signed)
This is a likely reaction to the the dye.  I have added it as an allergy in her chart.  She can still have dye in the future if necessary, but she will need to be pre-medicated before these procedures.  I will send in a prescription of steroids for her.  Please find out what local pharmacy she would like the prescription to be sent to. Thanks

## 2018-06-21 NOTE — Telephone Encounter (Signed)
Patient had an angiogram yesterday with Dew and today she states she has broken out in hives. The patient states she has taken Benadryl but it doesn't seem to be helping.

## 2018-06-21 NOTE — Telephone Encounter (Signed)
Please call in a medrol dose pack to her pharmacy....4 mg 21 day pack tapered. Thanks.

## 2018-06-21 NOTE — Telephone Encounter (Signed)
Patient wants her prescription called into [facility].

## 2018-06-25 ENCOUNTER — Encounter: Payer: Self-pay | Admitting: Vascular Surgery

## 2018-06-26 ENCOUNTER — Other Ambulatory Visit: Payer: Self-pay | Admitting: Family Medicine

## 2018-06-26 NOTE — Telephone Encounter (Signed)
Requested medication (s) are due for refill today: yes  Requested medication (s) are on the active medication list: yes  Last refill:  03/29/18  Future visit scheduled: yes  Notes to clinic:  ? original prescriber  Requested Prescriptions  Pending Prescriptions Disp Refills   cilostazol (PLETAL) 100 MG tablet [Pharmacy Med Name: CILOSTAZOL TABS 100MG ] 180 tablet 4    Sig: TAKE 1 TABLET TWICE A DAY     Hematology: Antiplatelets - cilostazol Failed - 06/26/2018  2:14 AM      Failed - HCT in normal range and within 180 days    HCT  Date Value Ref Range Status  10/31/2017 33.8 (L) 35.0 - 47.0 % Final   Hematocrit  Date Value Ref Range Status  10/30/2017 36.9 34.0 - 46.6 % Final         Failed - HGB in normal range and within 180 days    Hemoglobin  Date Value Ref Range Status  10/31/2017 11.1 (L) 12.0 - 16.0 g/dL Final  10/30/2017 12.2 11.1 - 15.9 g/dL Final         Failed - PLT in normal range and within 180 days    Platelets  Date Value Ref Range Status  10/31/2017 412 150 - 440 K/uL Final  10/30/2017 479 (H) 150 - 379 x10E3/uL Final         Failed - WBC in normal range and within 180 days    WBC  Date Value Ref Range Status  10/31/2017 14.1 (H) 3.6 - 11.0 K/uL Final         Passed - Valid encounter within last 6 months    Recent Outpatient Visits          3 weeks ago Sinus tachycardia   [facility] [place], Megan P, DO   3 months ago Simple chronic bronchitis ([place])   [place], Megan P, DO   4 months ago [place], Megan P, DO   5 months ago Pneumonia of right middle lobe due to infectious organism [facility])   [place], Megan P, DO   6 months ago Pneumonia of right middle lobe due to infectious organism [facility])   [place], [place], DO      Future Appointments            In 2 months Johnson, Barb Merino, DO [place], [place]   In 5 months   [employer], [place]

## 2018-07-06 ENCOUNTER — Ambulatory Visit (INDEPENDENT_AMBULATORY_CARE_PROVIDER_SITE_OTHER): Payer: Medicare Other | Admitting: Vascular Surgery

## 2018-07-06 VITALS — BP 157/83 | HR 131 | Resp 17 | Ht 61.0 in | Wt 111.0 lb

## 2018-07-06 DIAGNOSIS — I6523 Occlusion and stenosis of bilateral carotid arteries: Secondary | ICD-10-CM

## 2018-07-06 DIAGNOSIS — F17210 Nicotine dependence, cigarettes, uncomplicated: Secondary | ICD-10-CM | POA: Diagnosis not present

## 2018-07-06 DIAGNOSIS — J410 Simple chronic bronchitis: Secondary | ICD-10-CM

## 2018-07-06 DIAGNOSIS — I10 Essential (primary) hypertension: Secondary | ICD-10-CM

## 2018-07-06 DIAGNOSIS — I70213 Atherosclerosis of native arteries of extremities with intermittent claudication, bilateral legs: Secondary | ICD-10-CM | POA: Diagnosis not present

## 2018-07-06 NOTE — Patient Instructions (Signed)

## 2018-07-06 NOTE — Assessment & Plan Note (Signed)
The patient has severe aortoiliac occlusion that is not going to be amenable to endovascular therapy.  At this point, she does not have obvious limb threatening symptoms.  I discussed that it would require an aortobifemoral bypass more than likely to provide revascularization.  At this point, she is a fairly high risk patient and for a non-limb threatening situation I think it would be prudent and reasonable to consider continued conservative therapy for a time.  She does not want to have any surgery and she is a high risk patient understands this.  We will plan to see her back in several months and follow-up with ABIs.  She will continue her current medical regimen.

## 2018-07-06 NOTE — Progress Notes (Signed)
MRN : [id number]  Savannah Mcclure is a 77 y.o. (28-May-1941) female who presents with chief complaint of  Chief Complaint  Patient presents with  . Follow-up    [facility] 2week follow up  .  History of Present Illness: Patient returns today in follow up of her PAD.  Had angiogram a couple of weeks ago, she was found to have an aortoiliac occlusion not amenable to endovascular therapy.  She continues to have short distance claudication.  She does not have ulceration or gangrenous changes.  Her access sites are well-healed and she did not have any periprocedural complications.  Current Outpatient Medications  Medication Sig Dispense Refill  . B-COMPLEX-C PO Take 1 tablet by mouth daily.    . cilostazol (PLETAL) 100 MG tablet TAKE 1 TABLET TWICE A DAY 180 tablet 4  . cyclobenzaprine (FLEXERIL) 5 MG tablet Take 1 tablet (5 mg total) by mouth 2 (two) times daily as needed for muscle spasms. 60 tablet 2  . gabapentin (NEURONTIN) 300 MG capsule Take 1 capsule (300 mg total) by mouth 2 (two) times daily. 60 capsule 3  . hydrOXYzine (ATARAX/VISTARIL) 25 MG tablet Take 1 tablet (25 mg total) by mouth 3 (three) times daily as needed. 270 tablet 3  . ipratropium-albuterol (DUONEB) 0.5-2.5 (3) MG/3ML SOLN Take 3 mLs by nebulization every 6 (six) hours as needed. 360 mL 6  . lidocaine (LIDODERM) 5 % Place 1 patch onto the skin daily. Remove & Discard patch within 12 hours or as directed by MD 60 patch 6  . MAGNESIUM CHLORIDE PO Take 1 capsule by mouth daily.    . montelukast (SINGULAIR) 10 MG tablet Take 1 tablet (10 mg total) by mouth at bedtime. 90 tablet 3  . Multiple Vitamins-Minerals (MULTIVITAMIN WITH MINERALS) tablet Take 1 tablet by mouth daily.    Marland Kitchen NIACINAMIDE PO Take 1 capsule by mouth daily.    Marland Kitchen oxyCODONE (OXY IR/ROXICODONE) 5 MG immediate release tablet Take 1 tablet (5 mg total) by mouth 3 (three) times daily as needed for severe pain. For chronic pain 90 tablet 0  . POTASSIUM  CITRATE PO Take 1 capsule by mouth daily.    Marland Kitchen tiotropium (SPIRIVA HANDIHALER) 18 MCG inhalation capsule Place 1 capsule (18 mcg total) into inhaler and inhale daily. 90 capsule 3  . UNABLE TO FIND Take 1 tablet by mouth 2 (two) times daily. Cardio FX    . UNABLE TO FIND Take 1 tablet by mouth daily. Cinnergy    . UNABLE TO FIND Take 2 tablets by mouth daily. Joint FX    . triamcinolone ointment (KENALOG) 0.5 % Apply 1 application topically 2 (two) times daily. (Patient not taking: Reported on 06/19/2018) 30 g 0   Current Facility-Administered Medications  Medication Dose Route Frequency Provider Last Rate Last Dose  . albuterol (PROVENTIL) (2.5 MG/3ML) 0.083% nebulizer solution 2.5 mg  2.5 mg Nebulization Once [name], DO        Past Medical History:  Diagnosis Date  . 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 Anxiety   . Blind right eye   . Cardiac arrest ([place])   . Cataract    right eye  . Claudication ([place])   . COPD (chronic obstructive pulmonary disease) ([place])   . Dyspnea   . Heart murmur   . Hypertension   . Leaky heart valve   . Oxygen deficit    uses O2 at night 2 liters  . Peripheral vascular disease (  HCC)   . Scoliosis   . Sepsis ([place])   . Spinal stenosis   . Stroke [facility])    Eye  . Tachycardia     Past Surgical History:  Procedure Laterality Date  . ABDOMINAL HYSTERECTOMY     total  . BACK SURGERY    . BREAST LUMPECTOMY Left   . LOWER EXTREMITY ANGIOGRAPHY Right 06/20/2018   Procedure: LOWER EXTREMITY ANGIOGRAPHY;  Surgeon: Algernon Huxley, MD;  Location: [facility];  Service: Cardiovascular;  Laterality: Right;  . 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 WISDOM TOOTH EXTRACTION      Social History        Tobacco Use  . Smoking status: Current Every Day Smoker    Packs/day: 0.50  . Smokeless tobacco: Former Network engineer Use Topics  . Alcohol use: Yes    Frequency: Never    Comment: wine on occasion  . Drug use: No     Family History      Family History  Problem Relation Age of Onset  . Intracerebral hemorrhage Mother   . Hypertension Mother   . Heart failure Father   . Schizophrenia Daughter   . Rheum arthritis Maternal Grandmother   . Kidney disease Maternal Grandmother   . Heart disease Paternal Grandmother   . Stroke Paternal Grandmother   . Stroke Paternal Grandfather   . Heart disease Paternal Grandfather          Allergies  Allergen Reactions  . Other Itching    Cat Gut sutures   . Amlodipine Hives  . Azithromycin Hives  . Doxycycline Diarrhea  . Erythromycin Diarrhea  . Hydrocodone Nausea Only  . Lactose Intolerance ([place])   . Minocycline Diarrhea  . Nsaids Other (See Comments)    Internal Bleeding  . Relafen [Nabumetone] Hives  . Sulfa Antibiotics Hives  . Tetracyclines & Related Diarrhea    REVIEW OF SYSTEMS(Negative unless checked)  Constitutional: '[]' Weight loss'[]' Fever'[]' Chills Cardiac:'[]' Chest pain'[]' Chest pressure'[]' Palpitations '[]' Shortness of breath when laying flat '[]' Shortness of breath at rest '[]' Shortness of breath with exertion. Vascular: '[x]' Pain in legs with walking'[]' Pain in legsat rest'[]' Pain in legs when laying flat '[x]' Claudication '[]' Pain in feet when walking '[]' Pain in feet at rest '[]' Pain in feet when laying flat '[]' History of DVT '[]' Phlebitis '[]' Swelling in legs '[]' Varicose veins '[]' Non-healing ulcers Pulmonary: '[]' Uses home oxygen '[]' Productive cough'[]' Hemoptysis '[]' Wheeze '[]' COPD '[]' Asthma Neurologic: '[]' Dizziness '[]' Blackouts '[]' Seizures '[]' History of stroke '[]' History of TIA'[]' Aphasia '[x]' Temporary blindness'[]' Dysphagia '[]' Weaknessor numbness in arms '[]' Weakness or numbnessin legs Musculoskeletal: '[]' Arthritis '[]' Joint swelling '[x]' Joint pain '[x]' Low back pain Hematologic:'[]' Easy bruising'[]' Easy bleeding '[]' Hypercoagulable state '[]' Anemic '[]' Hepatitis Gastrointestinal:'[]' Blood in  stool'[]' Vomiting blood'[]' Gastroesophageal reflux/heartburn'[]' Abdominal pain Genitourinary: '[]' Chronic kidney disease '[]' Difficulturination '[]' Frequenturination '[]' Burning with urination'[]' Hematuria Skin: '[]' Rashes '[]' Ulcers '[]' Wounds Psychological: '[]' History of anxiety'[]' History of major depression.    Physical Examination  BP (!) 157/83 (BP Location: Right Arm)   Pulse (!) 131   Resp 17   Ht '5\' 1"'  (1.549 m)   Wt 111 lb (50.3 kg)   BMI 20.97 kg/m  Gen:  Thin and frail, NAD. Appears younger than stated age. Head: [place]/AT, No temporalis wasting. Ear/Nose/Throat: Hearing grossly intact, nares w/o erythema or drainage Eyes: Conjunctiva clear. Sclera non-icteric Neck: Supple.  Trachea midline Pulmonary:  Good air movement, no use of accessory muscles.  Cardiac: RRR, no JVD Vascular:  Vessel Right Left  Radial Palpable Palpable  PT Not Palpable Not Palpable  DP Not Palpable Not Palpable   Gastrointestinal: soft, non-tender/non-distended.  Musculoskeletal: M/S 5/5 throughout.  No deformity or atrophy. No edema. Neurologic: Sensation grossly intact in extremities.  Symmetrical.  Speech is fluent.  Psychiatric: Judgment intact, Mood & affect appropriate for pt's clinical situation. Dermatologic: No rashes or ulcers noted.  Access sites well healed       Labs Recent Results (from the past 2160 hour(s))  BUN     Status: None   Collection Time: 06/05/18 11:03 AM  Result Value Ref Range   BUN 11 8 - 23 mg/dL    Comment: Performed at [facility], [address] [place], [place] [address]  Creatinine, serum     Status: None   Collection Time: 06/05/18 11:03 AM  Result Value Ref Range   Creatinine, Ser 0.72 0.44 - 1.00 mg/dL   GFR calc non Af Amer >60 >60 mL/min   GFR calc Af Amer >60 >60 mL/min    Comment: (NOTE) The eGFR has been calculated using the CKD EPI equation. This calculation has not been validated in all  clinical situations. eGFR's persistently <60 mL/min signify possible Chronic Kidney Disease. Performed at [facility], [address] [place], [place] [address]   NM Myocar Multi W/Spect Tamela Oddi Motion / EF     Status: None   Collection Time: 06/15/18 12:00 PM  Result Value Ref Range   Rest HR 113 bpm   Rest BP 128/81 mmHg   Exercise duration (sec) 0 sec   Percent HR 93 %   Exercise duration (min) 0 min   Estimated workload 1.0 METS   Peak HR 133 bpm   Peak BP 105/87 mmHg   MPHR 143 bpm   SSS 3    SRS 3    SDS 0    TID 1.36    LV sys vol 12 mL   LV dias vol 34 46 - 106 mL    Radiology Nm Myocar Multi W/spect W/wall Motion / Ef  Result Date: 06/15/2018  The study is normal.  This is a low risk study.  The left ventricular ejection fraction is normal (55-65%).     Assessment/Plan HTN (hypertension) blood pressure control important in reducing the progression of atherosclerotic disease. On appropriate oral medications.   COPD (chronic obstructive pulmonary disease) (HCC) Continue pulmonary medications and aerosols as already ordered, these medications have been reviewed and there are no changes at this time.  Carotid stenosis Her right carotid stenosis was in the 40 to 59% range in her left carotid stenosis was in the 60 to 79% range when checked recently.  At this point, medical management would still be continued and recommended.  We will recheck this in 6 months with duplex.  Atherosclerosis of native arteries of extremity with intermittent claudication [facility]) The patient has severe aortoiliac occlusion that is not going to be amenable to endovascular therapy.  At this point, she does not have obvious limb threatening symptoms.  I discussed that it would require an aortobifemoral bypass more than likely to provide revascularization.  At this point, she is a fairly high risk patient and for a non-limb threatening situation I think it would be prudent and  reasonable to consider continued conservative therapy for a time.  She does not want to have any surgery and she is a high risk patient understands this.  We will plan to see her back in several months and follow-up with ABIs.  She  will continue her current medical regimen.    Leotis Pain, MD  07/06/2018 1:39 PM    This note was created with Dragon medical transcription system.  Any errors from dictation are purely unintentional

## 2018-07-18 ENCOUNTER — Telehealth: Payer: Self-pay | Admitting: *Deleted

## 2018-07-18 DIAGNOSIS — R000 Tachycardia, unspecified: Secondary | ICD-10-CM

## 2018-07-18 NOTE — Telephone Encounter (Signed)
Left voicemail message for patient to call back and schedule monitor.

## 2018-07-18 NOTE — Telephone Encounter (Signed)
Schedule 12/9 at 10 am.  Unable to reach pam via phone at time of call.  Please call again

## 2018-07-18 NOTE — Telephone Encounter (Signed)
-----   Message from Minna Merritts, MD sent at 06/30/2018  5:35 PM EST ----- Tachycardia noted on EKG Dr. Caryl Comes has evaluated Would recommend Holter as detailed below 48 hours to look for heart rate deviation when sleeping  thx TG  ----- Message ----- From: Deboraha Sprang, MD Sent: 06/30/2018   1:18 PM EST To: Emily Filbert, RN, Minna Merritts, MD  Tim this may be atrial tachycardia  A 48 hr holter might be helpful looking for any deviation of HRs particularly at night thnks steve ----- Message ----- From: Minna Merritts, MD Sent: 06/17/2018   8:28 PM EST To: Deboraha Sprang, MD, [facility] Triage  Echo and stress look normal Would she reconsider starting a low dose B-blocker for tachycardia, rates in the 130s (persistent) Will cc Dr. Caryl Comes, electrophysiology,  to review the EKGs, unable to exclude atrial tachycardia, all through 2019, and EKG in 2014 She can proceed with lower extremity angiography

## 2018-07-18 NOTE — Telephone Encounter (Signed)
Left voicemail message to call back  

## 2018-07-19 NOTE — Telephone Encounter (Signed)
Patient returning our call ° °Please call back ° °

## 2018-07-19 NOTE — Telephone Encounter (Signed)
Spoke with patient and reviewed results of testing and recommendations for 48 hour holter monitor. She states that they scheduled her for this and will be in to get that next week. She verbalized understanding of our conversation, agreement with plan, and had no further questions at this time.

## 2018-07-23 ENCOUNTER — Ambulatory Visit (INDEPENDENT_AMBULATORY_CARE_PROVIDER_SITE_OTHER): Payer: Medicare Other

## 2018-07-23 DIAGNOSIS — R000 Tachycardia, unspecified: Secondary | ICD-10-CM | POA: Diagnosis not present

## 2018-07-26 ENCOUNTER — Ambulatory Visit
Admission: RE | Admit: 2018-07-26 | Discharge: 2018-07-26 | Disposition: A | Discharge status: Home / Self Care | Payer: Medicare Other | Source: Ambulatory Visit | Attending: Cardiovascular Disease | Admitting: Cardiovascular Disease

## 2018-07-26 DIAGNOSIS — R000 Tachycardia, unspecified: Secondary | ICD-10-CM | POA: Diagnosis not present

## 2018-08-02 ENCOUNTER — Other Ambulatory Visit: Payer: Self-pay | Admitting: Student in an Organized Health Care Education/Training Program

## 2018-08-22 ENCOUNTER — Telehealth: Payer: Self-pay | Admitting: Student in an Organized Health Care Education/Training Program

## 2018-08-22 MED ORDER — GABAPENTIN 300 MG PO CAPS: 300.0000 mg | ORAL_CAPSULE | Freq: Two times a day (BID) | ORAL | 3 refills | Status: DC

## 2018-08-22 NOTE — Telephone Encounter (Signed)
Pt called and requested a refill of Gabapentin 300mg  [facility]

## 2018-09-06 ENCOUNTER — Ambulatory Visit (INDEPENDENT_AMBULATORY_CARE_PROVIDER_SITE_OTHER): Payer: Medicare Other | Admitting: Family Medicine

## 2018-09-06 ENCOUNTER — Encounter: Payer: Self-pay | Admitting: Family Medicine

## 2018-09-06 VITALS — BP 164/98 | HR 121 | Temp 97.5°F | Ht 61.0 in | Wt 118.1 lb

## 2018-09-06 DIAGNOSIS — Z23 Encounter for immunization: Secondary | ICD-10-CM | POA: Diagnosis not present

## 2018-09-06 DIAGNOSIS — D649 Anemia, unspecified: Secondary | ICD-10-CM

## 2018-09-06 DIAGNOSIS — F419 Anxiety disorder, unspecified: Secondary | ICD-10-CM | POA: Diagnosis not present

## 2018-09-06 DIAGNOSIS — J410 Simple chronic bronchitis: Secondary | ICD-10-CM

## 2018-09-06 DIAGNOSIS — I10 Essential (primary) hypertension: Secondary | ICD-10-CM

## 2018-09-06 MED ORDER — OMEPRAZOLE 20 MG PO CPDR: 20.0000 mg | DELAYED_RELEASE_CAPSULE | Freq: Every day | ORAL | 1 refills | Status: DC

## 2018-09-06 MED ORDER — LIDOCAINE 5 % EX PTCH: 1.0000 | MEDICATED_PATCH | CUTANEOUS | 6 refills | Status: DC

## 2018-09-06 MED ORDER — BENAZEPRIL HCL 10 MG PO TABS: 10.0000 mg | ORAL_TABLET | Freq: Every day | ORAL | 3 refills | Status: DC
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
## 2018-09-06 NOTE — Assessment & Plan Note (Signed)
Not doing well- will restart her benazepril at 10mg  daily and recheck 1 month. Call with any concerns.

## 2018-09-06 NOTE — Assessment & Plan Note (Signed)
Stable on current regimen. Continue current regimen and continue to monitor. Refills up to date today.

## 2018-09-06 NOTE — Assessment & Plan Note (Signed)
Rechecking levels today. Await results. Call with any concerns. Continue to monitor.  

## 2018-09-07 LAB — CBC WITH DIFFERENTIAL/PLATELET
Basophils Absolute: 0.1 10*3/uL (ref 0.0–0.2)
Basos: 1 %
EOS (ABSOLUTE): 0.3 10*3/uL (ref 0.0–0.4)
Eos: 3 %
Hematocrit: 45.1 % (ref 34.0–46.6)
Hemoglobin: 15.4 g/dL (ref 11.1–15.9)
Immature Grans (Abs): 0 10*3/uL (ref 0.0–0.1)
Immature Granulocytes: 0 %
Lymphocytes Absolute: 2.6 10*3/uL (ref 0.7–3.1)
Lymphs: 29 %
MCH: 31.3 pg (ref 26.6–33.0)
MCHC: 34.1 g/dL (ref 31.5–35.7)
MCV: 92 fL (ref 79–97)
Monocytes Absolute: 0.6 10*3/uL (ref 0.1–0.9)
Monocytes: 7 %
Neutrophils Absolute: 5.4 10*3/uL (ref 1.4–7.0)
Neutrophils: 60 %
PLATELETS: 441 10*3/uL (ref 150–450)
RBC: 4.92 x10E6/uL (ref 3.77–5.28)
RDW: 12.4 % (ref 11.7–15.4)
WBC: 9.1 10*3/uL (ref 3.4–10.8)

## 2018-09-07 LAB — COMPREHENSIVE METABOLIC PANEL
ALT: 16 IU/L (ref 0–32)
AST: 20 IU/L (ref 0–40)
Albumin/Globulin Ratio: 2.3 — ABNORMAL HIGH (ref 1.2–2.2)
Albumin: 5 g/dL — ABNORMAL HIGH (ref 3.7–4.7)
Alkaline Phosphatase: 108 IU/L (ref 39–117)
BUN/Creatinine Ratio: 16 (ref 12–28)
BUN: 12 mg/dL (ref 8–27)
Bilirubin Total: 0.5 mg/dL (ref 0.0–1.2)
CHLORIDE: 100 mmol/L (ref 96–106)
CO2: 22 mmol/L (ref 20–29)
Calcium: 9.8 mg/dL (ref 8.7–10.3)
Creatinine, Ser: 0.77 mg/dL (ref 0.57–1.00)
GFR calc Af Amer: 86 mL/min/{1.73_m2} (ref >59)
GFR calc non Af Amer: 74 mL/min/{1.73_m2} (ref >59)
Globulin, Total: 2.2 g/dL (ref 1.5–4.5)
Glucose: 82 mg/dL (ref 65–99)
Potassium: 4.2 mmol/L (ref 3.5–5.2)
Sodium: 143 mmol/L (ref 134–144)
Total Protein: 7.2 g/dL (ref 6.0–8.5)

## 2018-09-13 ENCOUNTER — Telehealth: Payer: Self-pay | Admitting: Student in an Organized Health Care Education/Training Program

## 2018-09-13 ENCOUNTER — Other Ambulatory Visit: Payer: Self-pay | Admitting: Student in an Organized Health Care Education/Training Program

## 2018-09-13 MED ORDER — GABAPENTIN 300 MG PO CAPS: 300.0000 mg | ORAL_CAPSULE | Freq: Two times a day (BID) | ORAL | 0 refills | Status: DC

## 2018-09-13 MED ORDER — CYCLOBENZAPRINE HCL 5 MG PO TABS: 5.0000 mg | ORAL_TABLET | Freq: Two times a day (BID) | ORAL | 0 refills | Status: AC | PRN

## 2018-09-13 NOTE — Telephone Encounter (Signed)
Dr. Holley Raring,                 Would you be willing to rewrite this prescription for a 90 day supply instead of 3 refills?

## 2018-09-13 NOTE — Telephone Encounter (Signed)
Patient called and informed

## 2018-09-13 NOTE — Telephone Encounter (Signed)
Pt called and stated that she usually gets a 90 day prescription for Gabapentin and flexoril and this time dr. Holley Raring only wrote for 30 days. Pt states that her copay is the same for 30 and 90 days and she would prefer the 90 day supply.

## 2018-09-19 ENCOUNTER — Encounter: Payer: Self-pay | Admitting: Family Medicine

## 2018-10-05 ENCOUNTER — Ambulatory Visit: Payer: Medicare Other | Admitting: Family Medicine

## 2018-10-15 ENCOUNTER — Encounter: Payer: Self-pay | Admitting: Family Medicine

## 2018-10-15 ENCOUNTER — Ambulatory Visit: Payer: Medicare Other | Admitting: Family Medicine

## 2018-10-15 ENCOUNTER — Ambulatory Visit (INDEPENDENT_AMBULATORY_CARE_PROVIDER_SITE_OTHER): Payer: Medicare Other | Admitting: Family Medicine

## 2018-10-15 VITALS — BP 153/75 | HR 130 | Temp 97.4°F | Wt 119.3 lb

## 2018-10-15 DIAGNOSIS — I10 Essential (primary) hypertension: Secondary | ICD-10-CM

## 2018-10-15 DIAGNOSIS — J410 Simple chronic bronchitis: Secondary | ICD-10-CM | POA: Diagnosis not present

## 2018-10-15 DIAGNOSIS — I70213 Atherosclerosis of native arteries of extremities with intermittent claudication, bilateral legs: Secondary | ICD-10-CM | POA: Diagnosis not present

## 2018-10-15 MED ORDER — BENAZEPRIL HCL 10 MG PO TABS: 10.0000 mg | ORAL_TABLET | Freq: Every day | ORAL | 1 refills | Status: DC

## 2018-10-15 MED ORDER — PREDNISONE 50 MG PO TABS: 50.0000 mg | ORAL_TABLET | Freq: Every day | ORAL | 0 refills | Status: DC

## 2018-10-15 NOTE — Assessment & Plan Note (Signed)
Wheezing. Will treat with prednisone. Call if not getting better or getting worse.

## 2018-10-15 NOTE — Assessment & Plan Note (Signed)
Will keep BP and cholesterol under good control. Continue to monitor.  

## 2018-10-15 NOTE — Assessment & Plan Note (Signed)
Has been out of benazepril for a few days. Restart. Continue meds. Under good control at home. Continue medication. Refill given today. Checking BMP.

## 2018-10-15 NOTE — Progress Notes (Signed)
BP (!) 153/75   Pulse (!) 130   Temp (!) 97.4 F (36.3 C) (Oral)   Wt 119 lb 4.8 oz (54.1 kg)   SpO2 98%   BMI 22.54 kg/m    Subjective:    Patient ID: Savannah Mcclure, female    DOB: Dec 28, 1940, 79 y.o.   MRN: [id number]  HPI: Savannah Mcclure is a 78 y.o. female  Chief Complaint  Patient presents with  . Hypertension    1 month f/up  . URI    pt states she has had congestion and sinus pressure for a little over a week    HYPERTENSION- has been out of her medicine for a couple of days Hypertension status: controlled  Satisfied with current treatment? yes Duration of hypertension: chronic BP monitoring frequency:  weekly BP medication side effects:  no Medication compliance: excellent compliance Previous BP meds:benazepril Aspirin: no Recurrent headaches: no Visual changes: no Palpitations: no Dyspnea: no Chest pain: no Lower extremity edema: no Dizzy/lightheaded: no  UPPER RESPIRATORY TRACT INFECTION Duration: 1 week Worst symptom: body aches Fever: yes- gone now Cough: yes Shortness of breath: no Wheezing: no Chest pain: no Chest tightness: no Chest congestion: no Nasal congestion: yes Runny nose: yes Post nasal drip: yes Sneezing: no Sore throat: no Swollen glands: no Sinus pressure: yes Headache: yes Face pain: no Toothache: no Ear pain: no  Ear pressure: yes- bilateral  Eyes red/itching:no Eye drainage/crusting: no  Vomiting: no Rash: no Fatigue: yes Sick contacts: yes Strep contacts: no  Context: better Recurrent sinusitis: no Relief with OTC cold/cough medications: no  Treatments attempted: none    Relevant past medical, surgical, family and social history reviewed and updated as indicated. Interim medical history since our last visit reviewed. Allergies and medications reviewed and updated.  Review of Systems  Constitutional: Positive for fatigue and fever. Negative for activity change, appetite change, chills,  diaphoresis and unexpected weight change.  HENT: Positive for congestion, postnasal drip and rhinorrhea. Negative for dental problem, drooling, ear discharge, ear pain, facial swelling, hearing loss, mouth sores, nosebleeds, sinus pressure, sinus pain, sneezing, sore throat, tinnitus, trouble swallowing and voice change.   Eyes: Negative.   Respiratory: Positive for cough, chest tightness, shortness of breath and wheezing. Negative for apnea, choking and stridor.   Cardiovascular: Negative.   Gastrointestinal: Negative.   Genitourinary: Negative.   Psychiatric/Behavioral: Negative.     Per HPI unless specifically indicated above     Objective:    BP (!) 153/75   Pulse (!) 130   Temp (!) 97.4 F (36.3 C) (Oral)   Wt 119 lb 4.8 oz (54.1 kg)   SpO2 98%   BMI 22.54 kg/m   Wt Readings from Last 3 Encounters:  10/15/18 119 lb 4.8 oz (54.1 kg)  09/06/18 118 lb 1.6 oz (53.6 kg)  07/06/18 111 lb (50.3 kg)    Physical Exam Vitals signs and nursing note reviewed.  Constitutional:      General: She is not in acute distress.    Appearance: Normal appearance. She is not ill-appearing, toxic-appearing or diaphoretic.  HENT:     Head: Normocephalic and atraumatic.     Right Ear: Tympanic membrane, ear canal and external ear normal. There is no impacted cerumen.     Left Ear: Tympanic membrane, ear canal and external ear normal. There is no impacted cerumen.     Nose: Congestion and rhinorrhea present.     Mouth/Throat:     Mouth: Mucous membranes are  moist.     Pharynx: Oropharynx is clear. No oropharyngeal exudate or posterior oropharyngeal erythema.  Eyes:     General: No scleral icterus.       Right eye: No discharge.        Left eye: No discharge.     Extraocular Movements: Extraocular movements intact.     Conjunctiva/sclera: Conjunctivae normal.     Pupils: Pupils are equal, round, and reactive to light.  Neck:     Musculoskeletal: Normal range of motion and neck supple. No  neck rigidity or muscular tenderness.     Vascular: No carotid bruit.  Cardiovascular:     Rate and Rhythm: Normal rate and regular rhythm.     Pulses: Normal pulses.     Heart sounds: Normal heart sounds. No murmur. No friction rub. No gallop.   Pulmonary:     Effort: Pulmonary effort is normal. No respiratory distress.     Breath sounds: No stridor. Wheezing present. No rhonchi or rales.  Chest:     Chest wall: No tenderness.  Musculoskeletal: Normal range of motion.  Lymphadenopathy:     Cervical: Cervical adenopathy present.  Skin:    General: Skin is warm and dry.     Capillary Refill: Capillary refill takes less than 2 seconds.     Coloration: Skin is not jaundiced or pale.     Findings: No bruising, erythema, lesion or rash.  Neurological:     General: No focal deficit present.     Mental Status: She is alert and oriented to person, place, and time. Mental status is at baseline.     Cranial Nerves: No cranial nerve deficit.     Sensory: No sensory deficit.     Motor: No weakness.     Coordination: Coordination normal.     Gait: Gait normal.     Deep Tendon Reflexes: Reflexes normal.  Psychiatric:        Mood and Affect: Mood normal.        Behavior: Behavior normal.        Thought Content: Thought content normal.        Judgment: Judgment normal.     Results for orders placed or performed in visit on 09/06/18  Comprehensive metabolic panel  Result Value Ref Range   Glucose 82 65 - 99 mg/dL   BUN 12 8 - 27 mg/dL   Creatinine, Ser 0.77 0.57 - 1.00 mg/dL   GFR calc non Af Amer 74 >59 mL/min/1.73   GFR calc Af Amer 86 >59 mL/min/1.73   BUN/Creatinine Ratio 16 12 - 28   Sodium 143 134 - 144 mmol/L   Potassium 4.2 3.5 - 5.2 mmol/L   Chloride 100 96 - 106 mmol/L   CO2 22 20 - 29 mmol/L   Calcium 9.8 8.7 - 10.3 mg/dL   Total Protein 7.2 6.0 - 8.5 g/dL   Albumin 5.0 (H) 3.7 - 4.7 g/dL   Globulin, Total 2.2 1.5 - 4.5 g/dL   Albumin/Globulin Ratio 2.3 (H) 1.2 - 2.2    Bilirubin Total 0.5 0.0 - 1.2 mg/dL   Alkaline Phosphatase 108 39 - 117 IU/L   AST 20 0 - 40 IU/L   ALT 16 0 - 32 IU/L  CBC with Differential/Platelet  Result Value Ref Range   WBC 9.1 3.4 - 10.8 x10E3/uL   RBC 4.92 3.77 - 5.28 x10E6/uL   Hemoglobin 15.4 11.1 - 15.9 g/dL   Hematocrit 45.1 34.0 - 46.6 %   MCV 92  79 - 97 fL   MCH 31.3 26.6 - 33.0 pg   MCHC 34.1 31.5 - 35.7 g/dL   RDW 12.4 11.7 - 15.4 %   Platelets 441 150 - 450 x10E3/uL   Neutrophils 60 Not Estab. %   Lymphs 29 Not Estab. %   Monocytes 7 Not Estab. %   Eos 3 Not Estab. %   Basos 1 Not Estab. %   Neutrophils Absolute 5.4 1.4 - 7.0 x10E3/uL   Lymphocytes Absolute 2.6 0.7 - 3.1 x10E3/uL   Monocytes Absolute 0.6 0.1 - 0.9 x10E3/uL   EOS (ABSOLUTE) 0.3 0.0 - 0.4 x10E3/uL   Basophils Absolute 0.1 0.0 - 0.2 x10E3/uL   Immature Granulocytes 0 Not Estab. %   Immature Grans (Abs) 0.0 0.0 - 0.1 x10E3/uL      Assessment & Plan:   Problem List Items Addressed This Visit      Cardiovascular and Mediastinum   HTN (hypertension) - Primary    Has been out of benazepril for a few days. Restart. Continue meds. Under good control at home. Continue medication. Refill given today. Checking BMP.      Relevant Medications   benazepril (LOTENSIN) 10 MG tablet   Other Relevant Orders   Basic metabolic panel   Atherosclerosis of native artery of both lower extremities with intermittent claudication (HCC)    Will keep BP and cholesterol under good control. Continue to monitor.       Relevant Medications   benazepril (LOTENSIN) 10 MG tablet     Respiratory   COPD (chronic obstructive pulmonary disease) (HCC)    Wheezing. Will treat with prednisone. Call if not getting better or getting worse.       Relevant Medications   predniSONE (DELTASONE) 50 MG tablet       Follow up plan: Return if symptoms worsen or fail to improve.

## 2018-10-16 LAB — BASIC METABOLIC PANEL
BUN/Creatinine Ratio: 15 (ref 12–28)
BUN: 12 mg/dL (ref 8–27)
CO2: 24 mmol/L (ref 20–29)
Calcium: 9.1 mg/dL (ref 8.7–10.3)
Chloride: 102 mmol/L (ref 96–106)
Creatinine, Ser: 0.79 mg/dL (ref 0.57–1.00)
GFR calc Af Amer: 83 mL/min/{1.73_m2} (ref >59)
GFR calc non Af Amer: 72 mL/min/{1.73_m2} (ref >59)
GLUCOSE: 99 mg/dL (ref 65–99)
Potassium: 3.9 mmol/L (ref 3.5–5.2)
Sodium: 142 mmol/L (ref 134–144)

## 2018-11-06 ENCOUNTER — Ambulatory Visit
Admission: EM | Admit: 2018-11-06 | Discharge: 2018-11-06 | Disposition: A | Discharge status: Home / Self Care | Payer: Medicare Other | Attending: Family Medicine | Admitting: Family Medicine

## 2018-11-06 ENCOUNTER — Encounter: Payer: Self-pay | Admitting: Emergency Medicine

## 2018-11-06 ENCOUNTER — Ambulatory Visit: Payer: Medicare Other | Admitting: Family Medicine

## 2018-11-06 ENCOUNTER — Ambulatory Visit: Payer: Medicare Other

## 2018-11-06 ENCOUNTER — Other Ambulatory Visit: Payer: Self-pay

## 2018-11-06 DIAGNOSIS — J441 Chronic obstructive pulmonary disease with (acute) exacerbation: Secondary | ICD-10-CM | POA: Diagnosis not present

## 2018-11-06 DIAGNOSIS — R0602 Shortness of breath: Secondary | ICD-10-CM | POA: Diagnosis not present

## 2018-11-06 DIAGNOSIS — Z7189 Other specified counseling: Secondary | ICD-10-CM | POA: Diagnosis not present

## 2018-11-06 DIAGNOSIS — R05 Cough: Secondary | ICD-10-CM | POA: Diagnosis not present

## 2018-11-06 MED ORDER — BENZONATATE 100 MG PO CAPS: 100.0000 mg | ORAL_CAPSULE | Freq: Three times a day (TID) | ORAL | 0 refills | Status: DC | PRN

## 2018-11-06 MED ORDER — AMOXICILLIN-POT CLAVULANATE 875-125 MG PO TABS: 1.0000 | ORAL_TABLET | Freq: Two times a day (BID) | ORAL | 0 refills | Status: DC

## 2018-11-06 MED ORDER — PREDNISONE 20 MG PO TABS: ORAL_TABLET | ORAL | 0 refills | Status: DC

## 2018-11-06 NOTE — ED Triage Notes (Signed)
Patient c/o cough and fever that started 1 week ago. She states it has been going on for 3 weeks but her cough is just getting worse.

## 2018-11-19 ENCOUNTER — Telehealth: Payer: Self-pay

## 2018-11-19 NOTE — Telephone Encounter (Signed)
Patient scheduled for an AWV on 11/22/2018 with [facility], Due to Covid-19 pandemic this is unable to be done in office, called patient to see if she was able to do this virtually. Patient scheduled for skype call on 11/21/2018 at 11:00am. email sent to patient with skype meeting details

## 2018-11-21 ENCOUNTER — Ambulatory Visit: Payer: Medicare Other

## 2018-11-21 ENCOUNTER — Telehealth: Payer: Self-pay

## 2018-11-21 NOTE — Telephone Encounter (Signed)
Attempted to complete AWV via skype due to covid-19 pandemic. We were unable to get connected on skype today. patient was rescheduled for AWV in office for July 2020. Patient confirmed and understands she has a call with Dr.Johnson tomorrow at 10:30am.

## 2018-11-22 ENCOUNTER — Ambulatory Visit (INDEPENDENT_AMBULATORY_CARE_PROVIDER_SITE_OTHER): Payer: Medicare Other | Admitting: Family Medicine

## 2018-11-22 ENCOUNTER — Ambulatory Visit: Payer: TRICARE For Life (TFL)

## 2018-11-22 ENCOUNTER — Encounter: Payer: Self-pay | Admitting: Family Medicine

## 2018-11-22 ENCOUNTER — Other Ambulatory Visit: Payer: Self-pay

## 2018-11-22 VITALS — Ht 63.0 in | Wt 115.0 lb

## 2018-11-22 DIAGNOSIS — F419 Anxiety disorder, unspecified: Secondary | ICD-10-CM

## 2018-11-22 DIAGNOSIS — J410 Simple chronic bronchitis: Secondary | ICD-10-CM

## 2018-11-22 DIAGNOSIS — I70213 Atherosclerosis of native arteries of extremities with intermittent claudication, bilateral legs: Secondary | ICD-10-CM | POA: Diagnosis not present

## 2018-11-22 NOTE — Progress Notes (Signed)
Ht 5\' 3"  (1.6 m)   Wt 115 lb (52.2 kg)   BMI 20.37 kg/m    Subjective:    Patient ID: Savannah Mcclure, female    DOB: [birth year], 78 y.o.   MRN: [id number]  HPI: Savannah Mcclure is a 78 y.o. female  Chief Complaint  Patient presents with  . Hypertension    f/u  . Cough   Savannah Mcclure presents today for follow up on her breathing. She notes that with the augmentin she had severe diarrhea. She feels like her breathing has gotten better. Still coughing. Still bringing some stuff up. She feels like she is about 90% better. No fevers. No chills. No issues with her ears, nose or throat. She has been using her colloidal silver and her inhalers.   She is very concerned about her daughter. She notes that she is in a lot of pain from caring for her daughter. She has a lot of stress also from caring from her daughter. She has been taking her hydroxyzine. She is feeling well with it. She is otherwise doing well. No other concerns or complaints at this time.   Relevant past medical, surgical, family and social history reviewed and updated as indicated. Interim medical history since our last visit reviewed. Allergies and medications reviewed and updated.  Review of Systems  Constitutional: Negative.   HENT: Negative.   Respiratory: Positive for cough and shortness of breath. Negative for apnea, choking, chest tightness, wheezing and stridor.   Cardiovascular: Negative.   Skin: Negative.   Neurological: Negative.   Hematological: Negative.   Psychiatric/Behavioral: Negative for agitation, behavioral problems, confusion, decreased concentration, dysphoric mood, hallucinations, self-injury, sleep disturbance and suicidal ideas. The patient is nervous/anxious. The patient is not hyperactive.     Per HPI unless specifically indicated above     Objective:    Ht 5\' 3"  (1.6 m)   Wt 115 lb (52.2 kg)   BMI 20.37 kg/m   Wt Readings from Last 3 Encounters:  11/22/18 115 lb (52.2 kg)   11/06/18 115 lb (52.2 kg)  10/15/18 119 lb 4.8 oz (54.1 kg)    Physical Exam Vitals signs and nursing note reviewed.  Pulmonary:     Effort: Pulmonary effort is normal. No respiratory distress.     Comments: Speaking in full sentences Neurological:     Mental Status: She is alert.  Psychiatric:        Mood and Affect: Mood normal.        Behavior: Behavior normal.        Thought Content: Thought content normal.        Judgment: Judgment normal.     Results for orders placed or performed in visit on 40/98/11  Basic metabolic panel  Result Value Ref Range   Glucose 99 65 - 99 mg/dL   BUN 12 8 - 27 mg/dL   Creatinine, Ser 0.79 0.57 - 1.00 mg/dL   GFR calc non Af Amer 72 >59 mL/min/1.73   GFR calc Af Amer 83 >59 mL/min/1.73   BUN/Creatinine Ratio 15 12 - 28   Sodium 142 134 - 144 mmol/L   Potassium 3.9 3.5 - 5.2 mmol/L   Chloride 102 96 - 106 mmol/L   CO2 24 20 - 29 mmol/L   Calcium 9.1 8.7 - 10.3 mg/dL      Assessment & Plan:   Problem List Items Addressed This Visit      Respiratory   COPD (chronic obstructive pulmonary disease) (HCC) -  Primary    Still with a bit of a cough, but about 90% better. Continue inhalers and continue to monitor. Call with any concerns.         Other   Anxiety    Slightly exacerbated with the care of her daughter. She knows that she can take up to 100mg  of her hydroxyzine. Continue to monitor. Call with any concerns.           Follow up plan: Return September, for follow up.   . This visit was completed via telephone due to the restrictions of the COVID-19 pandemic. All issues as above were discussed and addressed but no physical exam was performed. If it was felt that the patient should be evaluated in the office, they were directed there. The patient verbally consented to this visit. Patient was unable to complete an audio/visual visit due to Lack of equipment. Due to the catastrophic nature of the COVID-19 pandemic, this visit was  done through audio contact only. . Location of the patient: home . Location of the provider: home . Those involved with this call:  . Provider: Park Liter, DO . CMA: Lesle Chris, [place] . Front Desk/Registration: Linard Millers  . Time spent on call: 15 minutes on the phone discussing health concerns. 23 minutes total spent in review of patient's record and preparation of their chart.

## 2018-11-22 NOTE — Assessment & Plan Note (Signed)
Still with a bit of a cough, but about 90% better. Continue inhalers and continue to monitor. Call with any concerns.

## 2018-11-22 NOTE — Assessment & Plan Note (Signed)
Slightly exacerbated with the care of her daughter. She knows that she can take up to 100mg  of her hydroxyzine. Continue to monitor. Call with any concerns.

## 2018-11-23 ENCOUNTER — Ambulatory Visit: Payer: TRICARE For Life (TFL)

## 2018-11-27 ENCOUNTER — Encounter (INDEPENDENT_AMBULATORY_CARE_PROVIDER_SITE_OTHER): Payer: Medicare Other

## 2018-11-27 ENCOUNTER — Ambulatory Visit (INDEPENDENT_AMBULATORY_CARE_PROVIDER_SITE_OTHER): Payer: Medicare Other | Admitting: Vascular Surgery

## 2018-12-04 ENCOUNTER — Other Ambulatory Visit: Payer: Self-pay

## 2018-12-04 ENCOUNTER — Ambulatory Visit
Payer: Medicare Other | Attending: Student in an Organized Health Care Education/Training Program | Admitting: Student in an Organized Health Care Education/Training Program

## 2018-12-04 DIAGNOSIS — M5136 Other intervertebral disc degeneration, lumbar region: Secondary | ICD-10-CM

## 2018-12-04 DIAGNOSIS — G894 Chronic pain syndrome: Secondary | ICD-10-CM

## 2018-12-04 DIAGNOSIS — M48062 Spinal stenosis, lumbar region with neurogenic claudication: Secondary | ICD-10-CM | POA: Diagnosis not present

## 2018-12-04 DIAGNOSIS — M4120 Other idiopathic scoliosis, site unspecified: Secondary | ICD-10-CM

## 2018-12-04 DIAGNOSIS — M47814 Spondylosis without myelopathy or radiculopathy, thoracic region: Secondary | ICD-10-CM

## 2018-12-04 DIAGNOSIS — M47816 Spondylosis without myelopathy or radiculopathy, lumbar region: Secondary | ICD-10-CM | POA: Diagnosis not present

## 2018-12-04 MED ORDER — OXYCODONE HCL 5 MG PO TABS: 5.0000 mg | ORAL_TABLET | Freq: Four times a day (QID) | ORAL | 0 refills | Status: DC | PRN

## 2018-12-04 NOTE — Progress Notes (Signed)
Pain Management Virtual Encounter Note - Virtual Visit via Telephone Telehealth (real-time audio visits between healthcare provider and patient).  Patient's Phone No. & Preferred Pharmacy:  [phone] (home); [phone] (mobile); (Preferred) [phone] [email or web address]  [facility] - [address], [address] [place] [address] Phone: [phone] Fax: [place], [place] [address] Phone: [phone] Fax: [place], [place] [address] [place] [address] Phone: [phone] Fax: [phone]   Pre-screening note:  Our staff contacted [name] and offered her an "in person", "face-to-face" appointment versus a telephone encounter. She indicated preferring the telephone encounter, at this time.  Reason for Virtual Visit: COVID-19*  Social distancing based on CDC and AMA recommendations.   I contacted [place] on 12/04/2018 at 1:27 PM via telephone and clearly identified myself as Gillis Santa, MD. I verified that I was speaking with the correct person using two identifiers (Name and date of birth: May 04, 1941).  Advanced Informed Consent I sought verbal advanced consent from [place] for virtual visit interactions. I informed [name] of possible security and privacy concerns, risks, and limitations associated with providing "not-in-person" medical evaluation and management services. I also informed [name] of the availability of "in-person" appointments. Finally, I informed her that there would be a charge for the virtual visit and that she could be  personally, fully or partially, financially responsible for it. [name] expressed understanding and agreed to proceed.   Historic Elements   Savannah Mcclure is a 78 y.o. year old, female patient evaluated today after her last encounter by our practice on 09/13/2018. [name]  has a past medical history of Aneurysm ([place]), Anxiety, Blind right eye, Cardiac arrest ([place]), Cataract, Claudication ([place]), COPD (chronic obstructive pulmonary disease) ([place]), Dyspnea, Heart murmur, Hypertension, Leaky heart valve, Oxygen deficit, Peripheral vascular disease ([place]), Scoliosis, Sepsis ([place]), Spinal stenosis, Stroke ([place]), and Tachycardia. She also  has a past surgical history that includes right eye surgery; Wisdom tooth extraction; Back surgery; Abdominal hysterectomy; Breast lumpectomy (Left); and Lower Extremity Angiography (Right, 06/20/2018). [name] has a current medication list which includes the following prescription(s): b complex-c, benazepril, cilostazol, cyclobenzaprine, gabapentin, hydroxyzine, ipratropium-albuterol, lidocaine, magnesium chloride, montelukast, multivitamin with minerals, niacinamide, omeprazole, oxycodone, potassium citrate, tiotropium, UNABLE TO FIND, UNABLE TO FIND, and UNABLE TO FIND. She  reports that she has been smoking. She has a 30.00 pack-year smoking history. She has quit using smokeless tobacco. She reports current alcohol use. She reports that she does not use drugs. [name] is allergic to other; amlodipine; augmentin [amoxicillin-pot clavulanate]; azithromycin; contrast media [iodinated diagnostic agents]; doxycycline; erythromycin; hydrocodone; lactose intolerance (gi); minocycline; nsaids; relafen [nabumetone]; sulfa antibiotics; and tetracyclines & related.   HPI  I last saw her on 09/13/2018. She is being evaluated for medication management.  Patient calls and is endorsing increased pain in her upper lumbar spine.  Patient is status post T12 L1-L2 RFA on 05/07/2018 bilaterally.  She states that this provided her with significant pain relief and improvement in her functional status  until approximately 3 to 4 weeks ago where she started to notice increasing pain that was consistent.  Overall I am pleased with how the ablation went and given the patient's complex and scoliotic spine, 6 months of  moderate to significant pain relief that resulted in functional improvement is clinically significant.  Patient is interested in repeating.  Will place order for repeat T12, L1, L2 RFA bilaterally with sedation.  This will be done after COVID-19 restrictions for elective procedures are lifted.  We will also refill the patient's oxycodone.  Her last fill was in November and that has lasted her until April.  She uses the medications sporadically but given that she has having increased pain she states that she is using them more regularly.  Discussed increasing her quantity to 120 which should last the patient 3 to 4 months.   Pharmacotherapy Assessment   07/10/2018  1   07/10/2018  Oxycodone Hcl 5 MG Tablet  90.00 30 Un Pha   [id number]   Sou ([id number])   0  22.50 MME  Comm Ins   [place]     Monitoring: Pharmacotherapy: No side-effects or adverse reactions reported. [place] PMP: PDMP reviewed during this encounter.       Compliance: No problems identified or detected. Plan: Refer to "POC".  Review of recent tests  DG Chest 2 View CLINICAL DATA:  Sick for 3 weeks. Productive cough with fever and shortness of breath. Current smoker.  EXAM: CHEST - 2 VIEW  COMPARISON:  Chest radiograph 12/19/2017.  FINDINGS: Scoliotic deformity. Stigmata of DISH. Hyperinflation and scarring representing COPD. No consolidation or edema. Normal heart size. Thoracic atherosclerosis.  IMPRESSION: COPD. No active disease.  Stable exam.  Electronically Signed   By: Staci Righter M.D.   On: 11/06/2018 11:26   Office Visit on 10/15/2018  Component Date Value Ref Range Status  . Glucose 10/15/2018 99  65 - 99 mg/dL Final  . BUN 10/15/2018 12  8 - 27 mg/dL Final  . Creatinine, Ser 10/15/2018 0.79  0.57 - 1.00  mg/dL Final  . GFR calc non Af Amer 10/15/2018 72  >59 mL/min/1.73 Final  . GFR calc Af Amer 10/15/2018 83  >59 mL/min/1.73 Final  . BUN/Creatinine Ratio 10/15/2018 15  12 - 28 Final  . Sodium 10/15/2018 142  134 - 144 mmol/L Final  . Potassium 10/15/2018 3.9  3.5 - 5.2 mmol/L Final  . Chloride 10/15/2018 102  96 - 106 mmol/L Final  . CO2 10/15/2018 24  20 - 29 mmol/L Final  . Calcium 10/15/2018 9.1  8.7 - 10.3 mg/dL Final   Assessment  The primary encounter diagnosis was Thoracic spondylosis without myelopathy. Diagnoses of Spondylosis without myelopathy or radiculopathy, lumbar region, Chronic pain syndrome, Lumbar degenerative disc disease, Scoliosis (and kyphoscoliosis), idiopathic, and Spinal stenosis of lumbar region with neurogenic claudication were also pertinent to this visit.  Endorsing increased pain in her upper lumbar spine.  Patient is status post T12 L1-L2 RFA on 05/07/2018 bilaterally.  She states that this provided her with significant pain relief and improvement in her functional status until approximately 3 to 4 weeks ago when she started to notice increasing pain that was consistent.  Overall I am pleased with how the ablation went and given the patient's complex and scoliotic spine, 6 months of moderate to significant pain relief that resulted in functional improvement is clinically significant.  Patient is interested in repeating.  Will place order for repeat T12, L1, L2 RFA bilaterally with sedation.  This will be done after COVID-19 restrictions for elective procedures are lifted.  We will also refill the patient's oxycodone.  Her last fill was in November and that has lasted her until April.  She uses the  medications sporadically but given that she has having increased pain she states that she is using them more regularly.  Discussed increasing her quantity to #120 which should last the patient 3 to 4 months.  Plan of Care  I am having Savannah "[name]"  maintain her B-COMPLEX-C PO, multivitamin with minerals, UNABLE TO FIND, UNABLE TO FIND, UNABLE TO FIND, ipratropium-albuterol, tiotropium, hydrOXYzine, montelukast, MAGNESIUM CHLORIDE PO, POTASSIUM CITRATE PO, NIACINAMIDE PO, cilostazol, lidocaine, omeprazole, gabapentin, cyclobenzaprine, benazepril, and oxyCODONE.  Pharmacotherapy (Medications Ordered): Meds ordered this encounter  Medications  . DISCONTD: oxyCODONE (OXY IR/ROXICODONE) 5 MG immediate release tablet    Sig: Take 1 tablet (5 mg total) by mouth every 6 (six) hours as needed for severe pain. For chronic pain    Dispense:  120 tablet    Refill:  0  . oxyCODONE (OXY IR/ROXICODONE) 5 MG immediate release tablet    Sig: Take 1 tablet (5 mg total) by mouth every 6 (six) hours as needed for severe pain. For chronic pain    Dispense:  120 tablet    Refill:  0   Orders:  Orders Placed This Encounter  Procedures  . Radiofrequency,Thoracic    Standing Status:   Future    Standing Expiration Date:   06/04/2020    Scheduling Instructions:     Side(s): Bilateral     Level(s): T12, L1, L2 , Medial Branch Nerve(s)     Sedation: With Sedation     Scheduling Timeframe: ASAA    Order Specific Question:   Where will this procedure be performed?    Answer:   [facility]   Follow-up plan:   Return for Procedure, After COVID-19 restrictions lifted.   I discussed the assessment and treatment plan with the patient. The patient was provided an opportunity to ask questions and all were answered. The patient agreed with the plan and demonstrated an understanding of the instructions.  Patient advised to call back or seek an in-person evaluation if the symptoms or condition worsens.  Total duration of non-face-to-face encounter: 68minutes.  Note by: Gillis Santa, MD Date: 12/04/2018; Time: 1:27 PM  Disclaimer:  * Given the special circumstances of the COVID-19 pandemic, the federal government has announced that the Office for Civil  Rights (OCR) will exercise its enforcement discretion and will not impose penalties on physicians using telehealth in the event of noncompliance with regulatory requirements under the [place] and Accountability Act (HIPAA) in connection with the good faith provision of telehealth during the HYIFO-27 national public health emergency. ([place])

## 2018-12-05 ENCOUNTER — Other Ambulatory Visit: Payer: Self-pay | Admitting: Student in an Organized Health Care Education/Training Program

## 2018-12-15 NOTE — ED Provider Notes (Signed)
[facility]    CSN: [id number] Arrival date & time: 11/06/18  1034     History   Chief Complaint Chief Complaint  Patient presents with  . Cough  . Fever    HPI [name] is a 78 y.o. female.   The history is provided by the patient.  Cough  Associated symptoms: fever   Associated symptoms: no wheezing   Fever  Associated symptoms: cough   URI  Presenting symptoms: cough and fever   Severity:  Moderate Onset quality:  Sudden Duration:  1 week Timing:  Constant Progression:  Unchanged Chronicity:  New Relieved by:  Nothing Ineffective treatments:  OTC medications Associated symptoms: no wheezing   Risk factors: no recent travel and no sick contacts     Past Medical History:  Diagnosis Date  . 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 Anxiety   . Blind right eye   . Cardiac arrest ([place])   . Cataract    right eye  . Claudication ([place])   . COPD (chronic obstructive pulmonary disease) ([place])   . Dyspnea   . Heart murmur   . Hypertension   . Leaky heart valve   . Oxygen deficit    uses O2 at night 2 liters  . Peripheral vascular disease ([place])   . Scoliosis   . Sepsis ([place])   . Spinal stenosis   . Stroke [facility])    Eye  . Tachycardia     Patient Active Problem List   Diagnosis Date Noted  . Atherosclerosis of native artery of both lower extremities with intermittent claudication ([place]) 10/15/2018  . Spondylosis without myelopathy or radiculopathy, lumbar region 06/19/2018  . Thoracic spondylosis without myelopathy 06/19/2018  . Atherosclerosis of native arteries of extremity with intermittent claudication ([place]) 04/13/2018  . Carotid stenosis 04/13/2018  . Lumbar degenerative disc disease 12/19/2017  . Chronic, continuous use of opioids 12/19/2017  . Chronic left shoulder pain 12/19/2017  . Pain of both hip joints 12/19/2017  . Chronic pain syndrome 12/19/2017  . Controlled substance agreement signed 11/28/2017  . Scoliosis (and  kyphoscoliosis), idiopathic 10/30/2017  . PAD (peripheral artery disease) ([place]) 10/30/2017  . Skin lesions 10/30/2017  . Vision loss 10/30/2017  . Sinus tachycardia 10/30/2017  . Anxiety 10/30/2017  . Anemia 10/30/2017  . Hypokalemia 10/30/2017  . Abnormal weight loss 10/30/2017  . HTN (hypertension) 10/07/2017  . COPD (chronic obstructive pulmonary disease) ([place]) 10/07/2017  . 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  . History of tobacco abuse 07/25/2011    Past Surgical History:  Procedure Laterality Date  . ABDOMINAL HYSTERECTOMY     total  . BACK SURGERY    . BREAST LUMPECTOMY Left   . LOWER EXTREMITY ANGIOGRAPHY Right 06/20/2018   Procedure: LOWER EXTREMITY ANGIOGRAPHY;  Surgeon: Algernon Huxley, MD;  Location: [place] [facility];  Service: Cardiovascular;  Laterality: Right;  . 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 WISDOM TOOTH EXTRACTION      OB History   No obstetric history on file.      Home Medications    Prior to Admission medications   Medication Sig Start Date End Date Taking? Authorizing Provider  B-COMPLEX-C PO Take 1 tablet by mouth daily.   Yes [provider]  benazepril (LOTENSIN) 10 MG tablet Take 1 tablet (10 mg total) by mouth daily. 10/15/18  Yes Johnson, Megan P, DO  cilostazol (PLETAL)  100 MG tablet TAKE 1 TABLET TWICE A DAY 06/26/18  Yes Johnson, Megan P, DO  gabapentin (NEURONTIN) 300 MG capsule Take 1 capsule (300 mg total) by mouth 2 (two) times daily. 09/13/18 12/12/18 Yes Gillis Santa, MD  hydrOXYzine (ATARAX/VISTARIL) 25 MG tablet Take 1 tablet (25 mg total) by mouth 3 (three) times daily as needed. 01/30/18  Yes Johnson, Megan P, DO  ipratropium-albuterol (DUONEB) 0.5-2.5 (3) MG/3ML SOLN Take 3 mLs by nebulization every 6 (six) hours as needed. 11/20/17  Yes Johnson, Megan P, DO  lidocaine (LIDODERM) 5 % Place 1 patch onto the skin daily. Remove & Discard patch within 12 hours or as  directed by MD 09/06/18  Yes Johnson, Megan P, DO  MAGNESIUM CHLORIDE PO Take 1 capsule by mouth daily.   Yes [provider]  montelukast (SINGULAIR) 10 MG tablet Take 1 tablet (10 mg total) by mouth at bedtime. 03/06/18  Yes Johnson, Megan P, DO  Multiple Vitamins-Minerals (MULTIVITAMIN WITH MINERALS) tablet Take 1 tablet by mouth daily.   Yes [provider]  NIACINAMIDE PO Take 1 capsule by mouth daily.   Yes [provider]  omeprazole (PRILOSEC) 20 MG capsule Take 1 capsule (20 mg total) by mouth daily. 09/06/18  Yes Johnson, Megan P, DO  POTASSIUM CITRATE PO Take 1 capsule by mouth daily.   Yes [provider]  tiotropium (SPIRIVA HANDIHALER) 18 MCG inhalation capsule Place 1 capsule (18 mcg total) into inhaler and inhale daily. 01/30/18 01/30/19 Yes Johnson, Megan P, DO  oxyCODONE (OXY IR/ROXICODONE) 5 MG immediate release tablet Take 1 tablet (5 mg total) by mouth every 6 (six) hours as needed for severe pain. For chronic pain 12/04/18   Gillis Santa, MD  UNABLE TO FIND Take 1 tablet by mouth 2 (two) times daily. Cardio FX    [provider]  UNABLE TO FIND Take 1 tablet by mouth daily. [place]    [provider]  UNABLE TO FIND Take 2 tablets by mouth daily. Joint FX    [provider]    Family History Family History  Problem Relation Age of Onset  . Intracerebral hemorrhage Mother   . Hypertension Mother   . Heart failure Father   . Schizophrenia Daughter   . Rheum arthritis Maternal Grandmother   . Kidney disease Maternal Grandmother   . Heart disease Paternal Grandmother   . Stroke Paternal Grandmother   . Stroke Paternal Grandfather   . Heart disease Paternal Grandfather     Social History Social History   Tobacco Use  . Smoking status: Current Every Day Smoker    Packs/day: 1.00    Years: 30.00    Pack years: 30.00  . Smokeless tobacco: Former Network engineer Use Topics  . Alcohol use: Yes    Frequency:  Never    Comment: wine on occasion  . Drug use: No     Allergies   Other; Amlodipine; Augmentin [amoxicillin-pot clavulanate]; Azithromycin; Contrast media [iodinated diagnostic agents]; Doxycycline; Erythromycin; Hydrocodone; Lactose intolerance (gi); Minocycline; Nsaids; Relafen [nabumetone]; Sulfa antibiotics; and Tetracyclines & related   Review of Systems Review of Systems  Constitutional: Positive for fever.  Respiratory: Positive for cough. Negative for wheezing.      Physical Exam Triage Vital Signs ED Triage Vitals  Enc Vitals Group     BP 11/06/18 1055 (!) 151/89     Pulse Rate 11/06/18 1055 (!) 135     Resp 11/06/18 1055 (!) 24     Temp  11/06/18 1055 98.6 F (37 C)     Temp Source 11/06/18 1055 Oral     SpO2 11/06/18 1055 98 %     Weight 11/06/18 1052 115 lb (52.2 kg)     Height 11/06/18 1052 5\' 1"  (1.549 m)     Head Circumference --      Peak Flow --      Pain Score 11/06/18 1052 0     Pain Loc --      Pain Edu? --      Excl. in [place]? --    No data found.  Updated Vital Signs BP (!) 151/89 (BP Location: Right Arm)   Pulse (!) 135   Temp 98.6 F (37 C) (Oral)   Resp (!) 24   Ht 5\' 1"  (1.549 m)   Wt 52.2 kg   SpO2 98%   BMI 21.73 kg/m   Visual Acuity Right Eye Distance:   Left Eye Distance:   Bilateral Distance:    Right Eye Near:   Left Eye Near:    Bilateral Near:     Physical Exam Vitals signs and nursing note reviewed.  Constitutional:      General: She is not in acute distress.    Appearance: She is not toxic-appearing or diaphoretic.  Cardiovascular:     Rate and Rhythm: Normal rate.  Pulmonary:     Effort: Pulmonary effort is normal. No respiratory distress.     Breath sounds: No stridor.  Neurological:     Mental Status: She is alert.      UC Treatments / Results  Labs (all labs ordered are listed, but only abnormal results are displayed) Labs Reviewed - No data to display  EKG None  Radiology No results found.   Procedures Procedures (including critical care time)  Medications Ordered in UC Medications - No data to display  Initial Impression / Assessment and Plan / UC Course  I have reviewed the triage vital signs and the nursing notes.  Pertinent labs & imaging results that were available during my care of the patient were reviewed by me and considered in my medical decision making (see chart for details).      Final Clinical Impressions(s) / UC Diagnoses   Final diagnoses:  COPD exacerbation ([place])  Advice Given About Covid-19 Virus Infection    ED Prescriptions    Medication Sig Dispense Auth. Provider   amoxicillin-clavulanate (AUGMENTIN) 875-125 MG tablet Take 1 tablet by mouth 2 (two) times daily. 20 tablet Lataya Varnell, Linward Foster, MD   predniSONE (DELTASONE) 20 MG tablet 3 tabs po once day 1, then 2 tabs po qd x 2 days, then 1 tab po qd x 2 days, then half a tab po qd x 2 days 10 tablet Marny Smethers, Linward Foster, MD   benzonatate (TESSALON) 100 MG capsule Take 1 capsule (100 mg total) by mouth 3 (three) times daily as needed. 21 capsule Norval Gable, MD      1. x-ray result (copd)  and diagnosis reviewed with patient 2. rx as per orders above; reviewed possible side effects, interactions, risks and benefits   3. Follow-up prn if symptoms worsen or don't improve  Controlled Substance Prescriptions [place] Controlled Substance Registry consulted? Not Applicable   Norval Gable, MD 12/15/18 [phone]

## 2018-12-18 ENCOUNTER — Encounter: Payer: Medicare Other | Admitting: Student in an Organized Health Care Education/Training Program

## 2018-12-27 ENCOUNTER — Other Ambulatory Visit: Payer: Self-pay

## 2018-12-27 ENCOUNTER — Encounter: Payer: Self-pay | Admitting: Family Medicine

## 2018-12-27 ENCOUNTER — Other Ambulatory Visit: Payer: Medicare Other

## 2018-12-27 ENCOUNTER — Telehealth: Payer: Self-pay | Admitting: Family Medicine

## 2018-12-27 ENCOUNTER — Ambulatory Visit (INDEPENDENT_AMBULATORY_CARE_PROVIDER_SITE_OTHER): Payer: Medicare Other | Admitting: Family Medicine

## 2018-12-27 ENCOUNTER — Telehealth: Payer: Self-pay | Admitting: *Deleted

## 2018-12-27 VITALS — BP 124/71 | HR 138 | Ht 61.0 in | Wt 115.0 lb

## 2018-12-27 DIAGNOSIS — Z20822 Contact with and (suspected) exposure to covid-19: Secondary | ICD-10-CM

## 2018-12-27 DIAGNOSIS — R6889 Other general symptoms and signs: Secondary | ICD-10-CM | POA: Diagnosis not present

## 2018-12-27 DIAGNOSIS — I70213 Atherosclerosis of native arteries of extremities with intermittent claudication, bilateral legs: Secondary | ICD-10-CM | POA: Diagnosis not present

## 2018-12-27 NOTE — Telephone Encounter (Signed)
Patient needs covid testing. 77yo, HTN, PAD, chronic pain with fever, body aches, SOB, sore throat and cough for 4-5 days. Please set up ASAP Pt. Name: Savannah Mcclure "Savannah Mcclure DOB: 01-10-1941 MRN: [id number]  Reason for COVID?19 test: 77yo, HTN, PAD, chronic pain with fever, body aches, SOB, sore throat and cough for 4-5 days. Please set up ASAP  Insurance information (name and policy number):  Insurance eligibility

## 2018-12-27 NOTE — Progress Notes (Signed)
BP 124/71 (BP Location: Right Arm, Patient Position: Sitting, Cuff Size: Normal)    Pulse (!) 138    Ht 5\' 1"  (1.549 m)    Wt 115 lb (52.2 kg)    BMI 21.73 kg/m    Subjective:    Patient ID: Savannah Mcclure, female    DOB: October 06, 1940, 78 y.o.   MRN: [id number]  HPI: Savannah Mcclure is a 78 y.o. female  Chief Complaint  Patient presents with   Cough    Patient states symptoms ongoing 1 week. Would like to be tested for COVID.   Sore Throat   Shortness of Breath   Fatigue   Generalized Body Aches   UPPER RESPIRATORY TRACT INFECTION Duration: past few days Worst symptom: decreased appetite, sore throat, SOB Fever: unknown, does not have a thermometer Cough: yes Shortness of breath: yes Wheezing: yes Chest pain: no Chest tightness: yes Chest congestion: yes Nasal congestion: yes Runny nose: no Post nasal drip: no Sneezing: no Sore throat: yes Swollen glands: no Sinus pressure: no Headache: yes Face pain: no Toothache: no Ear pain: no  Ear pressure: no  Eyes red/itching:no Eye drainage/crusting: yes  Vomiting: no Rash: no Fatigue: yes Sick contacts: yes Strep contacts: no  Context: better Recurrent sinusitis: no Relief with OTC cold/cough medications: no  Treatments attempted: none    Relevant past medical, surgical, family and social history reviewed and updated as indicated. Interim medical history since our last visit reviewed. Allergies and medications reviewed and updated.  Review of Systems  Constitutional: Positive for chills, diaphoresis, fatigue and fever. Negative for activity change, appetite change and unexpected weight change.  HENT: Positive for rhinorrhea. Negative for congestion, dental problem, drooling, ear discharge, ear pain, facial swelling, hearing loss, mouth sores, nosebleeds, postnasal drip, sinus pressure, sinus pain, sneezing, sore throat, tinnitus, trouble swallowing and voice change.   Respiratory: Positive  for cough, chest tightness and shortness of breath. Negative for apnea, choking, wheezing and stridor.   Cardiovascular: Negative.   Gastrointestinal: Positive for nausea. Negative for abdominal distention, abdominal pain, anal bleeding, blood in stool, constipation, diarrhea, rectal pain and vomiting.  Musculoskeletal: Positive for back pain and myalgias. Negative for arthralgias, gait problem, joint swelling, neck pain and neck stiffness.  Skin: Negative.   Neurological: Negative.   Psychiatric/Behavioral: Negative.     Per HPI unless specifically indicated above     Objective:    BP 124/71 (BP Location: Right Arm, Patient Position: Sitting, Cuff Size: Normal)    Pulse (!) 138    Ht 5\' 1"  (1.549 m)    Wt 115 lb (52.2 kg)    BMI 21.73 kg/m   Wt Readings from Last 3 Encounters:  12/27/18 115 lb (52.2 kg)  11/22/18 115 lb (52.2 kg)  11/06/18 115 lb (52.2 kg)    Physical Exam Vitals signs and nursing note reviewed.  Constitutional:      General: She is not in acute distress.    Appearance: Normal appearance. She is not ill-appearing, toxic-appearing or diaphoretic.  HENT:     Head: Normocephalic and atraumatic.     Right Ear: External ear normal.     Left Ear: External ear normal.     Nose: Nose normal.     Mouth/Throat:     Mouth: Mucous membranes are moist.     Pharynx: Oropharynx is clear.  Eyes:     General: No scleral icterus.       Right eye: No discharge.  Left eye: No discharge.     Conjunctiva/sclera: Conjunctivae normal.     Pupils: Pupils are equal, round, and reactive to light.  Neck:     Musculoskeletal: Normal range of motion.  Cardiovascular:     Rate and Rhythm: Tachycardia present.  Pulmonary:     Effort: Pulmonary effort is normal. No respiratory distress.     Comments: Speaking in full sentences Musculoskeletal: Normal range of motion.  Skin:    Coloration: Skin is not jaundiced or pale.     Findings: No bruising, erythema, lesion or rash.    Neurological:     Mental Status: She is alert and oriented to person, place, and time. Mental status is at baseline.  Psychiatric:        Mood and Affect: Mood normal.        Behavior: Behavior normal.        Thought Content: Thought content normal.        Judgment: Judgment normal.     Results for orders placed or performed in visit on 07/86/75  Basic metabolic panel  Result Value Ref Range   Glucose 99 65 - 99 mg/dL   BUN 12 8 - 27 mg/dL   Creatinine, Ser 0.79 0.57 - 1.00 mg/dL   GFR calc non Af Amer 72 >59 mL/min/1.73   GFR calc Af Amer 83 >59 mL/min/1.73   BUN/Creatinine Ratio 15 12 - 28   Sodium 142 134 - 144 mmol/L   Potassium 3.9 3.5 - 5.2 mmol/L   Chloride 102 96 - 106 mmol/L   CO2 24 20 - 29 mmol/L   Calcium 9.1 8.7 - 10.3 mg/dL      Assessment & Plan:   Problem List Items Addressed This Visit    None    Visit Diagnoses    Suspected Covid-19 Virus Infection    -  Primary   Patient needs testing- will arrange. If negtive, will treat for bronchitis. Follow up Monday.   Relevant Orders   Temperature monitoring       Follow up plan: Return Monday.    This visit was completed via Doximity due to the restrictions of the COVID-19 pandemic. All issues as above were discussed and addressed. Physical exam was done as above through visual confirmation on Doximity. If it was felt that the patient should be evaluated in the office, they were directed there. The patient verbally consented to this visit.  Location of the patient: home  Location of the provider: home  Those involved with this call:   Provider: Park Liter, DO  CMA: Merilyn Baba, [place] Desk/Registration: Jill Side   Time spent on call: 15 minutes with patient face to face via video conference. More than 50% of this time was spent in counseling and coordination of care. 23 minutes total spent in review of patient's record and preparation of their chart.

## 2018-12-27 NOTE — Telephone Encounter (Signed)
Contacted pt per Dr Park Liter to schedule community covid testing; the pt would like to know about having her daughter tested at the same time; explained that this appointment for testing is solley for her; she verbalizes understanding; she lives in [facility] and says that she can come to appointment today; pt offered and accepted appointment 12/27/2018 at 1545 at the [facility] testing site; she verbalizes understanding; order placed per protocol; will route to provider for notification.

## 2018-12-27 NOTE — Telephone Encounter (Signed)
Patient needs covid testing. 77yo, HTN, PAD, chronic pain with fever, body aches, SOB, sore throat and cough for 4-5 days. Please set up ASAP Pt. Name: Savannah Mcclure "Savannah Mcclure DOB: 1940-10-17 MRN: [id number]  Reason for COVID?19 test: 77yo, HTN, PAD, chronic pain with fever, body aches, SOB, sore throat and cough for 4-5 days. Please set up ASAP  Insurance information  MEDICARE MEDICARE PART A AND B [id number]    Insurance eligibility  Eligible

## 2018-12-27 NOTE — Telephone Encounter (Signed)
FYI. Insurance provided and routed to [place].

## 2018-12-27 NOTE — Telephone Encounter (Signed)
Noted! Thank you

## 2018-12-27 NOTE — Telephone Encounter (Signed)
Please provide Insurance Information and send to [place]

## 2018-12-31 LAB — NOVEL CORONAVIRUS, NAA: SARS-CoV-2, NAA: NOT DETECTED

## 2019-01-01 ENCOUNTER — Ambulatory Visit (INDEPENDENT_AMBULATORY_CARE_PROVIDER_SITE_OTHER): Payer: Medicare Other | Admitting: Family Medicine

## 2019-01-01 ENCOUNTER — Other Ambulatory Visit: Payer: Self-pay

## 2019-01-01 ENCOUNTER — Encounter: Payer: Self-pay | Admitting: Family Medicine

## 2019-01-01 VITALS — BP 135/67 | HR 129 | Ht 61.0 in

## 2019-01-01 DIAGNOSIS — J410 Simple chronic bronchitis: Secondary | ICD-10-CM

## 2019-01-01 DIAGNOSIS — G4700 Insomnia, unspecified: Secondary | ICD-10-CM

## 2019-01-01 DIAGNOSIS — I70213 Atherosclerosis of native arteries of extremities with intermittent claudication, bilateral legs: Secondary | ICD-10-CM

## 2019-01-01 MED ORDER — PREDNISONE 10 MG PO TABS: 10.0000 mg | ORAL_TABLET | Freq: Every day | ORAL | 0 refills | Status: DC

## 2019-01-01 NOTE — Assessment & Plan Note (Signed)
COVID-19 test negative. Advised patient to stay home until no fever for 3 days. Will treat with prednisone, no abx given allergies. Recheck 2 weeks by phone. Call with any concerns.

## 2019-01-01 NOTE — Progress Notes (Signed)
BP 135/67   Pulse (!) 129   Ht 5\' 1"  (1.549 m)   BMI 21.73 kg/m    Subjective:    Patient ID: Savannah Mcclure, female    DOB: [birth year], 78 y.o.   MRN: [id number]  HPI: Savannah Mcclure is a 78 y.o. female  Chief Complaint  Patient presents with  . Cough    f/u.   Marland Kitchen Sore Throat  . Insomnia    x about a month   UPPER RESPIRATORY TRACT INFECTION Duration: 1-1.5 weeks Worst symptom: cough Fever: unknown, doesn't have a thermometer Cough: yes Shortness of breath: yes Wheezing: no Chest pain: no Chest tightness: no Chest congestion: yes Nasal congestion: no Runny nose: no Post nasal drip: yes Sneezing: no Sore throat: no Swollen glands: no Sinus pressure: no Headache: no Face pain: no Toothache: no Ear pain: no  Ear pressure: no  Eyes red/itching:yes Eye drainage/crusting: yes  Vomiting: no Rash: no Fatigue: yes Sick contacts: yes Strep contacts: no  Context: better Recurrent sinusitis: no Relief with OTC cold/cough medications: yes  Treatments attempted: cold/sinus, mucinex and anti-histamine   INSOMNIA Duration: 1 month Satisfied with sleep quality: no Difficulty falling asleep: yes Difficulty staying asleep: no Waking a few hours after sleep onset: no Early morning awakenings: no Daytime hypersomnolence: no Wakes feeling refreshed: no Good sleep hygiene: yes Apnea: no Snoring: no Depressed/anxious mood: yes Recent stress: yes Restless legs/nocturnal leg cramps: no Chronic pain/arthritis: yes History of sleep study: no Treatments attempted: hydroxyzine     Relevant past medical, surgical, family and social history reviewed and updated as indicated. Interim medical history since our last visit reviewed. Allergies and medications reviewed and updated.  Review of Systems  Constitutional: Negative.   Respiratory: Negative.   Cardiovascular: Negative.   Musculoskeletal: Negative.   Skin: Negative.    Psychiatric/Behavioral: Positive for sleep disturbance. Negative for agitation, behavioral problems, confusion, decreased concentration, dysphoric mood, hallucinations, self-injury and suicidal ideas. The patient is nervous/anxious. The patient is not hyperactive.     Per HPI unless specifically indicated above     Objective:    BP 135/67   Pulse (!) 129   Ht 5\' 1"  (1.549 m)   BMI 21.73 kg/m   Wt Readings from Last 3 Encounters:  12/27/18 115 lb (52.2 kg)  11/22/18 115 lb (52.2 kg)  11/06/18 115 lb (52.2 kg)    Physical Exam Vitals signs and nursing note reviewed.  Constitutional:      General: She is not in acute distress.    Appearance: Normal appearance. She is not ill-appearing, toxic-appearing or diaphoretic.  HENT:     Head: Normocephalic and atraumatic.     Right Ear: External ear normal.     Left Ear: External ear normal.     Nose: Congestion and rhinorrhea present.     Mouth/Throat:     Mouth: Mucous membranes are moist. No oral lesions.     Pharynx: Oropharynx is clear. No pharyngeal swelling, oropharyngeal exudate, posterior oropharyngeal erythema or uvula swelling.     Tonsils: No tonsillar exudate or tonsillar abscesses.  Eyes:     General: No scleral icterus.       Right eye: No discharge.        Left eye: No discharge.     Conjunctiva/sclera: Conjunctivae normal.     Pupils: Pupils are equal, round, and reactive to light.  Neck:     Musculoskeletal: Normal range of motion.  Pulmonary:     Effort: Pulmonary  effort is normal. No respiratory distress.     Comments: Speaking in full sentences Musculoskeletal: Normal range of motion.  Skin:    Coloration: Skin is not jaundiced or pale.     Findings: No bruising, erythema, lesion or rash.  Neurological:     Mental Status: She is alert and oriented to person, place, and time. Mental status is at baseline.  Psychiatric:        Mood and Affect: Mood normal.        Behavior: Behavior normal.        Thought  Content: Thought content normal.        Judgment: Judgment normal.     Results for orders placed or performed in visit on 12/27/18  Novel Coronavirus, NAA ([facility])  Result Value Ref Range   SARS-CoV-2, NAA Not Detected Not Detected      Assessment & Plan:   Problem List Items Addressed This Visit      Respiratory   COPD (chronic obstructive pulmonary disease) ([place]) - Primary    COVID-19 test negative. Advised patient to stay home until no fever for 3 days. Will treat with prednisone, no abx given allergies. Recheck 2 weeks by phone. Call with any concerns.       Relevant Medications   predniSONE (DELTASONE) 10 MG tablet    Other Visit Diagnoses    Insomnia, unspecified type       Offered Rx for trazodone. She declined it at this time. Will take 2 hydroxyzine. Will call if she changes her mind.        Follow up plan: Return in about 2 weeks (around 01/15/2019) for follow up COPD .   Marland Kitchen This visit was completed via Doximity due to the restrictions of the COVID-19 pandemic. All issues as above were discussed and addressed. Physical exam was done as above through visual confirmation on Doximity. If it was felt that the patient should be evaluated in the office, they were directed there. The patient verbally consented to this visit. . Location of the patient: home . Location of the provider: work . Those involved with this call:  . Provider: Park Liter, DO . CMA: Lesle Chris, [place] . Front Desk/Registration: Don Perking  . Time spent on call: 25 minutes with patient face to face via video conference. More than 50% of this time was spent in counseling and coordination of care. 40 minutes total spent in review of patient's record and preparation of their chart.

## 2019-01-10 IMAGING — CR DG CHEST 2V
1 series · 2 of 2 positions shown · non-contrast
Comparison: Two-view chest x-ray 09/10/2012 and 09/19/2008.

CLINICAL DATA: Shortness of breath.  Productive cough.

EXAM:
CHEST  2 VIEW

[Series 1: dg chest 2 view · 0.14mm/px · 2 of 2 slices shown]
[im 1/2]
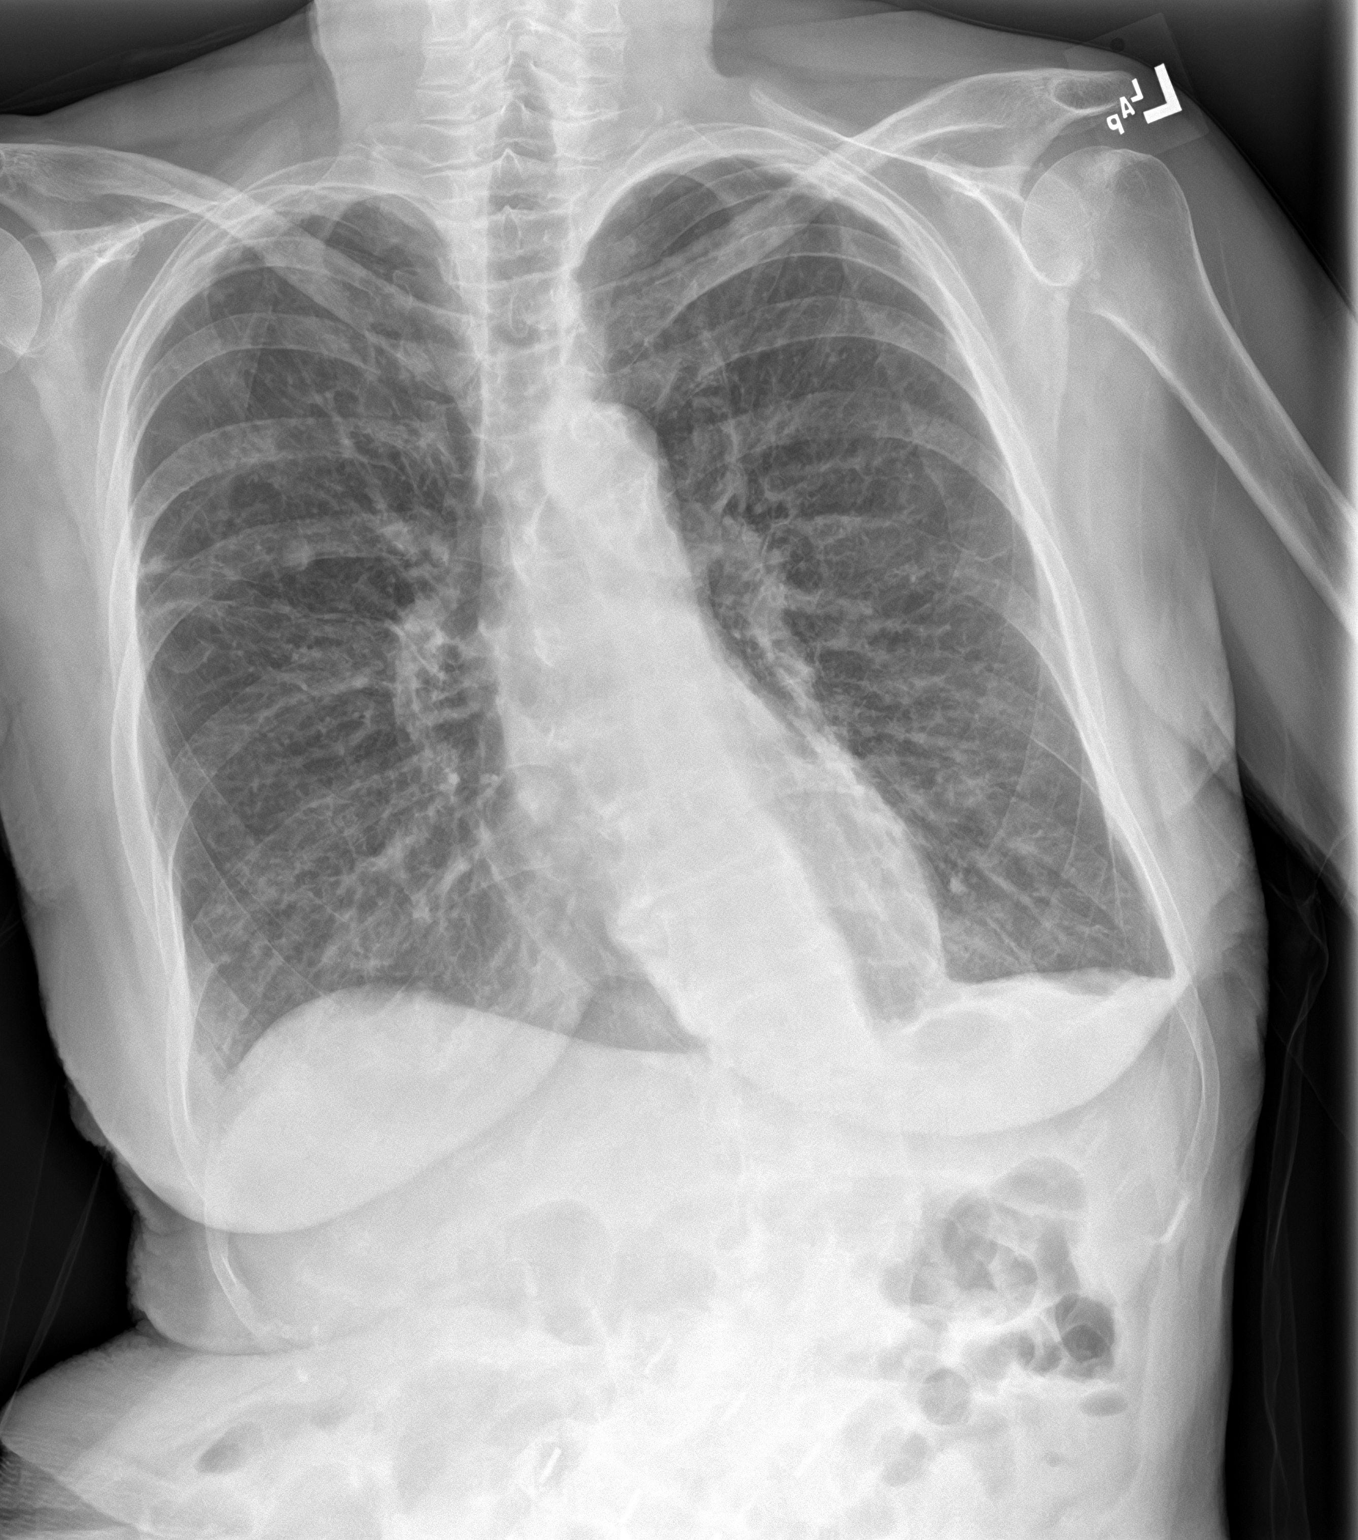
[im 2/2]
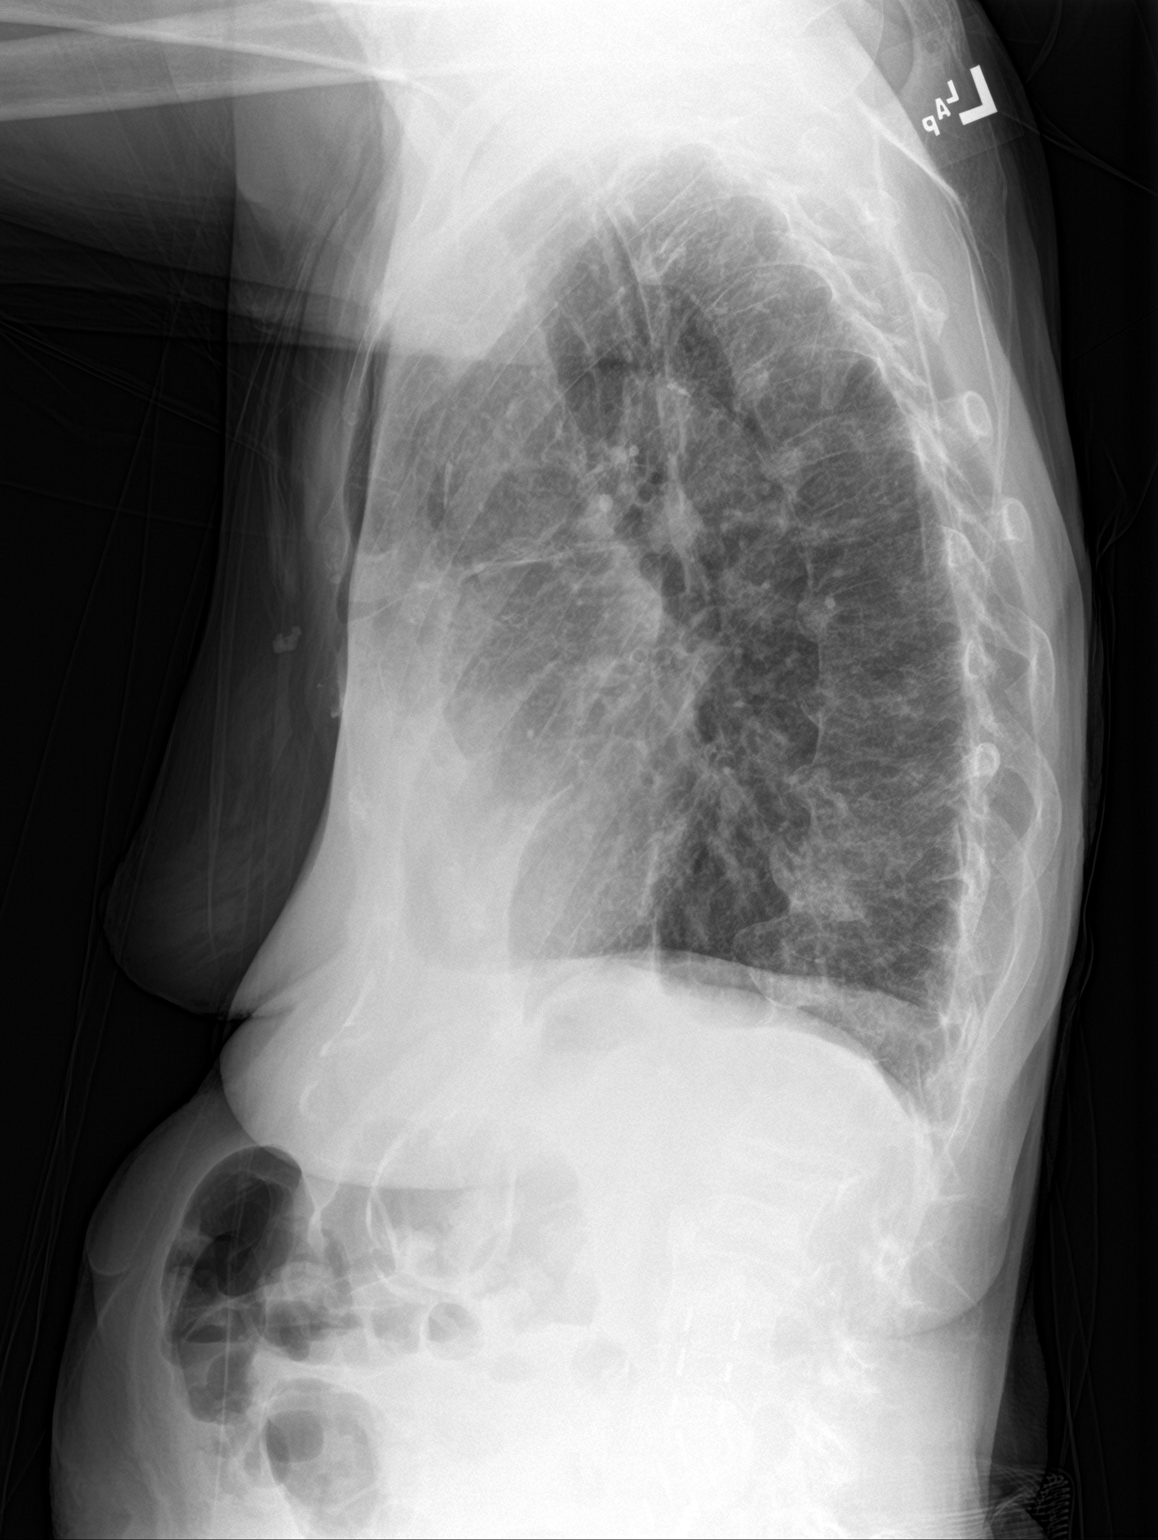

[2 of 2 positions shown; findings below may reference images not displayed]

FINDINGS: Nodular densities in the right upper lobe are stable.

The heart size is normal. Bilateral lower lobe airspace disease is
new. Small effusions are present bilaterally. There is no edema.

Severe levoconvex scoliosis at the thoracolumbar junction is again
noted.
IMPRESSION: 1. New bilateral lower lobe airspace disease and effusions
compatible with lobar pneumonia.
2. Stable nodular densities in the right upper lobe.
3. Scoliosis.

## 2019-01-15 ENCOUNTER — Other Ambulatory Visit: Payer: Self-pay

## 2019-01-15 ENCOUNTER — Encounter (INDEPENDENT_AMBULATORY_CARE_PROVIDER_SITE_OTHER): Payer: Self-pay | Admitting: Vascular Surgery

## 2019-01-15 ENCOUNTER — Ambulatory Visit (INDEPENDENT_AMBULATORY_CARE_PROVIDER_SITE_OTHER): Payer: Medicare Other

## 2019-01-15 ENCOUNTER — Ambulatory Visit (INDEPENDENT_AMBULATORY_CARE_PROVIDER_SITE_OTHER): Payer: Medicare Other | Admitting: Vascular Surgery

## 2019-01-15 VITALS — BP 154/82 | HR 114 | Resp 18 | Ht 61.0 in | Wt 118.0 lb

## 2019-01-15 DIAGNOSIS — Z79899 Other long term (current) drug therapy: Secondary | ICD-10-CM

## 2019-01-15 DIAGNOSIS — I70213 Atherosclerosis of native arteries of extremities with intermittent claudication, bilateral legs: Secondary | ICD-10-CM

## 2019-01-15 DIAGNOSIS — F17200 Nicotine dependence, unspecified, uncomplicated: Secondary | ICD-10-CM

## 2019-01-15 DIAGNOSIS — J410 Simple chronic bronchitis: Secondary | ICD-10-CM | POA: Diagnosis not present

## 2019-01-15 DIAGNOSIS — I6523 Occlusion and stenosis of bilateral carotid arteries: Secondary | ICD-10-CM | POA: Diagnosis not present

## 2019-01-15 DIAGNOSIS — I10 Essential (primary) hypertension: Secondary | ICD-10-CM | POA: Diagnosis not present

## 2019-01-15 NOTE — Assessment & Plan Note (Signed)
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  She only has claudication symptoms so no intervention has been performed in her case.  Her ABIs today are actually better at 0.75 on the right and 0.72 on the left with reasonable monophasic waveforms distally. At this time, she is doing reasonably well and I would not recommend any intervention.  Plan to recheck in 6 months with noninvasive studies.  No current limb threatening or life-threatening symptoms.

## 2019-01-15 NOTE — Patient Instructions (Signed)

## 2019-01-15 NOTE — Progress Notes (Signed)
MRN : [id number]  Savannah Mcclure is a 78 y.o. (1940/12/14) female who presents with chief complaint of  Chief Complaint  Patient presents with  . Follow-up    ultrasound  .  History of Present Illness: patient comes in today for follow up of multiple medical issues.  She is doing reasonably well.  She is more active because she is having to take care of her daughter and her daughter's family.  She denies any ulceration or infection.  No focal neurologic events. Specifically, the patient denies amaurosis fugax, speech or swallowing difficulties, or arm or leg weakness or numbness.  Her right carotid stenosis was in the 40 to 59% range in her left carotid stenosis was in the 60 to 79% range when checked recently. 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  She only has claudication symptoms so no intervention has been performed in her case.  Her ABIs today are actually better at 0.75 on the right and 0.72 on the left with reasonable monophasic waveforms distally.  Current Outpatient Medications  Medication Sig Dispense Refill  . B-COMPLEX-C PO Take 1 tablet by mouth daily.    . benazepril (LOTENSIN) 10 MG tablet Take 1 tablet (10 mg total) by mouth daily. 90 tablet 1  . cilostazol (PLETAL) 100 MG tablet TAKE 1 TABLET TWICE A DAY 180 tablet 4  . cyclobenzaprine (FLEXERIL) 5 MG tablet Take 5 mg by mouth 3 (three) times daily as needed for muscle spasms.    Marland Kitchen gabapentin (NEURONTIN) 300 MG capsule     . hydrOXYzine (ATARAX/VISTARIL) 25 MG tablet Take 1 tablet (25 mg total) by mouth 3 (three) times daily as needed. 270 tablet 3  . ipratropium-albuterol (DUONEB) 0.5-2.5 (3) MG/3ML SOLN Take 3 mLs by nebulization every 6 (six) hours as needed. 360 mL 6  . lidocaine (LIDODERM) 5 % Place 1 patch onto the skin daily. Remove & Discard patch within 12 hours or as directed by MD 60 patch 6  . MAGNESIUM CHLORIDE PO Take 1 capsule by mouth  daily.    . montelukast (SINGULAIR) 10 MG tablet Take 1 tablet (10 mg total) by mouth at bedtime. 90 tablet 3  . Multiple Vitamins-Minerals (MULTIVITAMIN WITH MINERALS) tablet Take 1 tablet by mouth daily.    Marland Kitchen NIACINAMIDE PO Take 1 capsule by mouth daily.    Marland Kitchen omeprazole (PRILOSEC) 20 MG capsule Take 1 capsule (20 mg total) by mouth daily. 90 capsule 1  . oxyCODONE (OXY IR/ROXICODONE) 5 MG immediate release tablet Take 1 tablet (5 mg total) by mouth every 6 (six) hours as needed for severe pain. For chronic pain 120 tablet 0  . POTASSIUM CITRATE PO Take 1 capsule by mouth daily.    . predniSONE (DELTASONE) 10 MG tablet Take 1 tablet (10 mg total) by mouth daily with breakfast. 42 tablet 0  . tiotropium (SPIRIVA HANDIHALER) 18 MCG inhalation capsule Place 1 capsule (18 mcg total) into inhaler and inhale daily. 90 capsule 3  . UNABLE TO FIND Take 1 tablet by mouth 2 (two) times daily. Cardio FX    . UNABLE TO FIND Take 1 tablet by mouth daily. Cinnergy    . UNABLE TO FIND Take 2 tablets by mouth daily. Joint FX    . gabapentin (NEURONTIN) 300 MG capsule Take 1 capsule (300 mg total) by mouth 2 (two) times daily. 180 capsule 0   No current facility-administered medications for this visit.  Past Medical History:  Diagnosis Date  . 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 Anxiety   . Blind right eye   . Cardiac arrest ([place])   . Cataract    right eye  . Claudication ([place])   . COPD (chronic obstructive pulmonary disease) ([place])   . Dyspnea   . Heart murmur   . Hypertension   . Leaky heart valve   . Oxygen deficit    uses O2 at night 2 liters  . Peripheral vascular disease ([place])   . Scoliosis   . Sepsis ([place])   . Spinal stenosis   . Stroke [facility])    Eye  . Tachycardia     Past Surgical History:  Procedure Laterality Date  . ABDOMINAL HYSTERECTOMY     total  . BACK SURGERY    . BREAST LUMPECTOMY Left   . LOWER EXTREMITY ANGIOGRAPHY Right 06/20/2018   Procedure: LOWER EXTREMITY  ANGIOGRAPHY;  Surgeon: Algernon Huxley, MD;  Location: [place] [facility];  Service: Cardiovascular;  Laterality: Right;  . 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 WISDOM TOOTH EXTRACTION      Social History Social History   Tobacco Use  . Smoking status: Current Every Day Smoker    Packs/day: 1.00    Years: 30.00    Pack years: 30.00  . Smokeless tobacco: Former Network engineer Use Topics  . Alcohol use: Yes    Frequency: Never    Comment: wine on occasion  . Drug use: No     Family History Family History  Problem Relation Age of Onset  . Intracerebral hemorrhage Mother   . Hypertension Mother   . Heart failure Father   . Schizophrenia Daughter   . Rheum arthritis Maternal Grandmother   . Kidney disease Maternal Grandmother   . Heart disease Paternal Grandmother   . Stroke Paternal Grandmother   . Stroke Paternal Grandfather   . Heart disease Paternal Grandfather     Allergies  Allergen Reactions  . Other Itching    Cat Gut sutures   . Amlodipine Hives  . Augmentin [Amoxicillin-Pot Clavulanate] Diarrhea  . Azithromycin Hives  . Contrast Media [Iodinated Diagnostic Agents] Hives  . Doxycycline Diarrhea  . Erythromycin Diarrhea  . Hydrocodone Nausea Only  . Lactose Intolerance ([place])   . Minocycline Diarrhea  . Nsaids Other (See Comments)    Internal Bleeding  . Relafen [Nabumetone] Hives  . Sulfa Antibiotics Hives  . Tetracyclines & Related Diarrhea   REVIEW OF SYSTEMS(Negative unless checked)  Constitutional: [] ?Weight loss[] ?Fever[] ?Chills Cardiac:[] ?Chest pain[] ?Chest pressure[] ?Palpitations [] ?Shortness of breath when laying flat [] ?Shortness of breath at rest [] ?Shortness of breath with exertion. Vascular: [x] ?Pain in legs with walking[] ?Pain in legsat rest[] ?Pain in legs when laying flat [x] ?Claudication [] ?Pain in feet when walking [] ?Pain in feet at rest [] ?Pain in feet when laying flat [] ?History of  DVT [] ?Phlebitis [] ?Swelling in legs [] ?Varicose veins [] ?Non-healing ulcers Pulmonary: [] ?Uses home oxygen [] ?Productive cough[] ?Hemoptysis [] ?Wheeze [] ?COPD [] ?Asthma Neurologic: [] ?Dizziness [] ?Blackouts [] ?Seizures [] ?History of stroke [] ?History of TIA[] ?Aphasia [x] ?Temporary blindness[] ?Dysphagia [] ?Weaknessor numbness in arms [] ?Weakness or numbnessin legs Musculoskeletal: [] ?Arthritis [] ?Joint swelling [x] ?Joint pain [x] ?Low back pain Hematologic:[] ?Easy bruising[] ?Easy bleeding [] ?Hypercoagulable state [] ?Anemic [] ?Hepatitis Gastrointestinal:[] ?Blood in stool[] ?Vomiting blood[] ?Gastroesophageal reflux/heartburn[] ?Abdominal pain Genitourinary: [] ?Chronic kidney disease [] ?Difficulturination [] ?Frequenturination [] ?Burning with urination[] ?Hematuria Skin: [] ?Rashes [] ?Ulcers [] ?Wounds Psychological: [] ?History of anxiety[] ?History of major depression.  Physical Examination  Vitals:   01/15/19 0956 01/15/19 0957  BP: (!) 183/72 (!) 154/82  Pulse: Marland Kitchen)  114   Resp: 18   Weight: 118 lb (53.5 kg)   Height: 5\' 1"  (1.549 m)    Body mass index is 22.3 kg/m. Gen:  WD/WN, NAD Head: [place]/AT, No temporalis wasting. Ear/Nose/Throat: Hearing grossly intact, nares w/o erythema or drainage, trachea midline Eyes: Conjunctiva clear. Sclera non-icteric Neck: Supple.  Left carotid bruit  Pulmonary:  Good air movement, equal and clear to auscultation bilaterally.  Cardiac: tachycardic Vascular:  Vessel Right Left  Radial Palpable Palpable                  Femoral Not Palpable Not Palpable      PT Not Palpable Not Palpable  DP Not Palpable Not Palpable   Gastrointestinal: soft, non-tender/non-distended. No guarding/reflex.  Musculoskeletal: M/S 5/5 throughout.  No deformity or atrophy. No edema. Neurologic: CN 2-12 intact. Sensation grossly intact in extremities.  Symmetrical.  Speech is fluent. Motor exam  as listed above. Psychiatric: Judgment intact, Mood & affect appropriate for pt's clinical situation. Dermatologic: No rashes or ulcers noted.  No cellulitis or open wounds.      CBC Lab Results  Component Value Date   WBC 9.1 09/06/2018   HGB 15.4 09/06/2018   HCT 45.1 09/06/2018   MCV 92 09/06/2018   PLT 441 09/06/2018    BMET    Component Value Date/Time   NA 142 10/15/2018 1334   NA 132 (L) 09/12/2012 1540   K 3.9 10/15/2018 1334   K 3.8 09/11/2012 0401   CL 102 10/15/2018 1334   CL 99 09/11/2012 0401   CO2 24 10/15/2018 1334   CO2 24 09/11/2012 0401   GLUCOSE 99 10/15/2018 1334   GLUCOSE 126 (H) 10/31/2017 1300   GLUCOSE 262 (H) 09/11/2012 0401   BUN 12 10/15/2018 1334   BUN 22 (H) 09/11/2012 0401   CREATININE 0.79 10/15/2018 1334   CREATININE 0.86 09/11/2012 0401   CALCIUM 9.1 10/15/2018 1334   CALCIUM 8.5 09/11/2012 0401   GFRNONAA 72 10/15/2018 1334   GFRNONAA >60 09/11/2012 0401   GFRAA 83 10/15/2018 1334   GFRAA >60 09/11/2012 0401   CrCl cannot be calculated (Patient's most recent lab result is older than the maximum 21 days allowed.).  COAG Lab Results  Component Value Date   INR 1.07 10/07/2017    Radiology No results found.    Assessment/Plan HTN (hypertension) blood pressure control important in reducing the progression of atherosclerotic disease. On appropriate oral medications.   COPD (chronic obstructive pulmonary disease) (HCC) Continue pulmonary medications and aerosols as already ordered, these medications have been reviewed and there are no changes at this time.  Carotid stenosis Her right carotid stenosis was in the 40 to 59% range in her left carotid stenosis was in the 60 to 79% range when checked recently. At this point, medical management would still be continued and recommended. We will recheck this in 6 months with duplex.  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  She only has claudication symptoms so no intervention has been performed in her case.  Her ABIs today are actually better at 0.75 on the right and 0.72 on the left with reasonable monophasic waveforms distally. At this time, she is doing reasonably well and I would not recommend any intervention.  Plan to recheck in 6 months with noninvasive studies.  No current limb threatening or life-threatening symptoms.  Leotis Pain, MD  01/15/2019 11:36 AM    This note was created with Dragon medical transcription system.  Any errors from dictation are purely unintentional

## 2019-01-30 ENCOUNTER — Telehealth: Payer: Self-pay | Admitting: Family Medicine

## 2019-01-30 NOTE — Telephone Encounter (Signed)
Medication Refill - Medication: benazepril (LOTENSIN) 10 MG tablet    Has the patient contacted their pharmacy? Yes.   (Agent: If no, request that the patient contact the pharmacy for the refill.) (Agent: If yes, when and what did the pharmacy advise?)  [facility]   Agent: Please be advised that RX refills may take up to 3 business days. We ask that you follow-up with your pharmacy.

## 2019-01-30 NOTE — Telephone Encounter (Signed)
6 month supply sent in in March- should not be out

## 2019-01-31 ENCOUNTER — Other Ambulatory Visit: Payer: Self-pay | Admitting: Family Medicine

## 2019-01-31 NOTE — Telephone Encounter (Signed)
Pharmacy is getting medication ready for patient.  

## 2019-01-31 NOTE — Telephone Encounter (Signed)
Would Dr. Wynetta Emery like to refill

## 2019-02-08 ENCOUNTER — Other Ambulatory Visit: Payer: Self-pay | Admitting: Family Medicine

## 2019-02-19 ENCOUNTER — Telehealth: Payer: Self-pay | Admitting: Family Medicine

## 2019-02-19 NOTE — Telephone Encounter (Signed)
Called patient. This request was from [place]. The last Rx was sent to [facility]. Explained to patient that she can continue to get her Benazepril from [place], then contact us to send it [facility] when she runs out--or she can get it transferred and to contact [facility].  Also requesting prednisone. Was last filled in May. Patient states it really helps with her breathing and her arthritis. She is unable to get in to Pain Management for procedures due to COVID. I explained Dr. Wynetta Emery would have to review the prednisone Rx and we would let her know.

## 2019-02-19 NOTE — Telephone Encounter (Signed)
6 months supply given in March- should not be due until September

## 2019-02-19 NOTE — Telephone Encounter (Signed)
Appointment scheduled.

## 2019-02-19 NOTE — Telephone Encounter (Signed)
REFILL benazepril (LOTENSIN) 10 MG tablet  predniSONE (DELTASONE) 10 MG tablet  [facility] - [place], [place] - [address] [phone] (Phone) [phone] (Fax)

## 2019-02-19 NOTE — Telephone Encounter (Signed)
Will need an appointment for a prednisone rx

## 2019-02-20 ENCOUNTER — Ambulatory Visit (INDEPENDENT_AMBULATORY_CARE_PROVIDER_SITE_OTHER): Payer: Medicare Other

## 2019-02-20 DIAGNOSIS — Z0000 Encounter for general adult medical examination without abnormal findings: Secondary | ICD-10-CM | POA: Diagnosis not present

## 2019-02-20 NOTE — Progress Notes (Signed)
Subjective:   Savannah Mcclure is a 78 y.o. female who presents for Medicare Annual (Subsequent) preventive examination.  Review of Systems:   Cardiac Risk Factors include: advanced age (>48men, >75 women);dyslipidemia;hypertension     Objective:     Vitals: There were no vitals taken for this visit.  There is no height or weight on file to calculate BMI.  Advanced Directives 11/06/2018 06/20/2018 06/19/2018 05/07/2018 03/19/2018 03/01/2018 01/31/2018  Does Patient Have a Medical Advance Directive? No No No No No No No  Type of Advance Directive - - - - - - -  Does patient want to make changes to medical advance directive? - - - - - - -  Copy of [place] in Chart? - - - - - - -  Would patient like information on creating a medical advance directive? - - No - Patient declined No - Patient declined No - Patient declined No - Patient declined No - Patient declined    Tobacco Social History   Tobacco Use  Smoking Status Current Every Day Smoker  . Packs/day: 1.00  . Years: 30.00  . Pack years: 30.00  Smokeless Tobacco Never Used  Tobacco Comment   0.75     Ready to quit: Yes Counseling given: Yes Comment: 0.75   Clinical Intake:  Pre-visit preparation completed: Yes  Pain : 0-10 Pain Score: 2  Pain Type: Chronic pain Pain Location: Back Pain Descriptors / Indicators: Aching Pain Onset: More than a month ago Pain Frequency: Constant     Nutritional Risks: None Diabetes: No  How often do you need to have someone help you when you read instructions, pamphlets, or other written materials from your doctor or pharmacy?: 1 - Never What is the last grade level you completed in school?: college  Interpreter Needed?: No  Information entered by :: Tiffany Hill,LPN  Past Medical History:  Diagnosis Date  . 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 Anxiety   . Blind right eye   . Cardiac arrest ([place])   . Cataract    right eye  . Claudication ([place])    . COPD (chronic obstructive pulmonary disease) ([place])   . Dyspnea   . Heart murmur   . Hypertension   . Leaky heart valve   . Oxygen deficit    uses O2 at night 2 liters  . Peripheral vascular disease ([place])   . Scoliosis   . Sepsis ([place])   . Spinal stenosis   . Stroke [facility])    Eye  . Tachycardia    Past Surgical History:  Procedure Laterality Date  . ABDOMINAL HYSTERECTOMY     total  . BACK SURGERY    . BREAST LUMPECTOMY Left   . LOWER EXTREMITY ANGIOGRAPHY Right 06/20/2018   Procedure: LOWER EXTREMITY ANGIOGRAPHY;  Surgeon: Algernon Huxley, MD;  Location: [place] [facility];  Service: Cardiovascular;  Laterality: Right;  . 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 WISDOM TOOTH EXTRACTION     Family History  Problem Relation Age of Onset  . Intracerebral hemorrhage Mother   . Hypertension Mother   . Heart failure Father   . Schizophrenia Daughter   . Rheum arthritis Maternal Grandmother   . Kidney disease Maternal Grandmother   . Heart disease Paternal Grandmother   . Stroke Paternal Grandmother   . Stroke Paternal Grandfather   . Heart disease Paternal Grandfather    Social History  Socioeconomic History  . Marital status: Widowed    Spouse name: Not on file  . Number of children: Not on file  . Years of education: college   . Highest education level: Not on file  Occupational History  . Occupation: retired  Scientific laboratory technician  . Financial resource strain: Not very hard  . Food insecurity    Worry: Never true    Inability: Never true  . Transportation needs    Medical: No    Non-medical: No  Tobacco Use  . Smoking status: Current Every Day Smoker    Packs/day: 1.00    Years: 30.00    Pack years: 30.00  . Smokeless tobacco: Never Used  . Tobacco comment: 0.75  Substance and Sexual Activity  . Alcohol use: Yes    Frequency: Never    Comment: wine on occasion  . Drug use: No  . Sexual activity: Not on file  Lifestyle  . Physical activity     Days per week: 5 days    Minutes per session: 20 min  . Stress: Not at all  Relationships  . Social Herbalist on phone: Once a week    Gets together: Never    Attends religious service: Never    Active member of club or organization: No    Attends meetings of clubs or organizations: Never    Relationship status: Widowed  Other Topics Concern  . Not on file  Social History Narrative  . Not on file    Outpatient Encounter Medications as of 02/20/2019  Medication Sig  . B-COMPLEX-C PO Take 1 tablet by mouth daily.  . benazepril (LOTENSIN) 10 MG tablet Take 1 tablet (10 mg total) by mouth daily.  . cilostazol (PLETAL) 100 MG tablet TAKE 1 TABLET TWICE A DAY  . cyclobenzaprine (FLEXERIL) 5 MG tablet Take 5 mg by mouth 3 (three) times daily as needed for muscle spasms.  Marland Kitchen gabapentin (NEURONTIN) 300 MG capsule Take 1 capsule (300 mg total) by mouth 2 (two) times daily.  Marland Kitchen gabapentin (NEURONTIN) 300 MG capsule   . hydrOXYzine (ATARAX/VISTARIL) 25 MG tablet TAKE 1 TABLET THREE TIMES A DAY AS NEEDED  . ipratropium-albuterol (DUONEB) 0.5-2.5 (3) MG/3ML SOLN Take 3 mLs by nebulization every 6 (six) hours as needed.  . lidocaine (LIDODERM) 5 % Place 1 patch onto the skin daily. Remove & Discard patch within 12 hours or as directed by MD  . MAGNESIUM CHLORIDE PO Take 1 capsule by mouth daily.  . montelukast (SINGULAIR) 10 MG tablet Take 1 tablet (10 mg total) by mouth at bedtime.  . Multiple Vitamins-Minerals (MULTIVITAMIN WITH MINERALS) tablet Take 1 tablet by mouth daily.  Marland Kitchen NIACINAMIDE PO Take 1 capsule by mouth daily.  Marland Kitchen omeprazole (PRILOSEC) 20 MG capsule Take 1 capsule (20 mg total) by mouth daily.  Marland Kitchen oxyCODONE (OXY IR/ROXICODONE) 5 MG immediate release tablet Take 1 tablet (5 mg total) by mouth every 6 (six) hours as needed for severe pain. For chronic pain  . POTASSIUM CITRATE PO Take 1 capsule by mouth daily.  Marland Kitchen tiotropium (SPIRIVA HANDIHALER) 18 MCG inhalation capsule Place 1  capsule (18 mcg total) into inhaler and inhale daily.  Marland Kitchen UNABLE TO FIND Take 1 tablet by mouth 2 (two) times daily. Cardio FX  . UNABLE TO FIND Take 1 tablet by mouth daily. Cinnergy  . UNABLE TO FIND Take 2 tablets by mouth daily. Joint FX  . [DISCONTINUED] predniSONE (DELTASONE) 10 MG tablet Take 1 tablet (  10 mg total) by mouth daily with breakfast. (Patient not taking: Reported on 02/20/2019)   No facility-administered encounter medications on file as of 02/20/2019.     Activities of Daily Living In your present state of health, do you have any difficulty performing the following activities: 02/20/2019 11/22/2018  Hearing? Y N  Comment loss of hearing right ear -  Vision? Y N  Comment blind in right eye, eye glasses -  Difficulty concentrating or making decisions? N N  Walking or climbing stairs? N Y  Comment just takes time -  Dressing or bathing? N N  Doing errands, shopping? N N  Preparing Food and eating ? N -  Using the Toilet? N -  In the past six months, have you accidently leaked urine? N -  Do you have problems with loss of bowel control? N -  Managing your Medications? N -  Managing your Finances? N -  Housekeeping or managing your Housekeeping? N -  Some recent data might be hidden    Patient Care Team: [name], DO as PCP - General (Family Medicine)    Assessment:   This is a routine wellness examination for Savannah.  Exercise Activities and Dietary recommendations Current Exercise Habits: Home exercise routine, Type of exercise: walking;stretching, Time (Minutes): 15, Frequency (Times/Week): 7, Weekly Exercise (Minutes/Week): 105, Intensity: Mild, Exercise limited by: None identified  Goals    . Quit Smoking     Smoking cessation discussed       Fall Risk: Fall Risk  02/20/2019 11/22/2018 06/19/2018 05/07/2018 03/19/2018  Falls in the past year? 0 0 0 No No  Number falls in past yr: - 0 - - -  Injury with Fall? - 0 - - -    FALL RISK PREVENTION PERTAINING  TO THE HOME:  Any stairs in or around the home? Yes  If so, are there any without handrails? No   Home free of loose throw rugs in walkways, pet beds, electrical cords, etc? Yes  Adequate lighting in your home to reduce risk of falls? Yes   ASSISTIVE DEVICES UTILIZED TO PREVENT FALLS:  Life alert? No  Use of a cane, walker or w/c? Yes  not currently using Grab bars in the bathroom? No  Shower chair or bench in shower? Yes  Elevated toilet seat or a handicapped toilet? No   DME ORDERS:  DME order needed?  No   TIMED UP AND GO:  Unable to perform    Depression Screen PHQ 2/9 Scores 02/20/2019 11/22/2018 09/06/2018 06/19/2018  PHQ - 2 Score 0 0 0 0  PHQ- 9 Score - 4 2 -     Cognitive Function     6CIT Screen 11/20/2017  What Year? 0 points  What month? 0 points  What time? 0 points  Count back from 20 0 points  Months in reverse 0 points  Repeat phrase 0 points  Total Score 0    Immunization History  Administered Date(s) Administered  . Influenza, High Dose Seasonal PF 04/23/2018  . Influenza-Unspecified 06/15/2017  . Pneumococcal Conjugate-13 09/06/2018  . Td 12/12/2017    Qualifies for Shingles Vaccine? Yes  reaction to zostavax in 2012, declined shingrix   Tdap: up to date   Flu Vaccine: up to date   Pneumococcal Vaccine: due 08/2019  Screening Tests Health Maintenance  Topic Date Due  . INFLUENZA VACCINE  03/16/2019  . PNA vac Low Risk Adult (2 of 2 - PPSV23) 09/07/2019  . TETANUS/TDAP  12/13/2027  . DEXA SCAN  Completed    Cancer Screenings:  Colorectal Screening: no longer required  Mammogram: no longer required  Bone Density: Completed 08/15/2010  Lung Cancer Screening: (Low Dose CT Chest recommended if Age 67-80 years, 30 pack-year currently smoking OR have quit w/in 15years.) does qualify.   Had CT scan 12/2017  Additional Screening:  Hepatitis C Screening: does not qualify  Vision Screening: Recommended annual ophthalmology exams for  early detection of glaucoma and other disorders of the eye. Is the patient up to date with their annual eye exam?  Yes  Who is the provider or what is the name of the office in which the pt attends annual eye exams? [facility]   Dental Screening: Recommended annual dental exams for proper oral hygiene  Community Resource Referral:  CRR required this visit?  No       Plan:  I have personally reviewed and addressed the Medicare Annual Wellness questionnaire and have noted the following in the patient's chart:  A. Medical and social history B. Use of alcohol, tobacco or illicit drugs  C. Current medications and supplements D. Functional ability and status E.  Nutritional status F.  Physical activity G. Advance directives H. List of other physicians I.  Hospitalizations, surgeries, and ER visits in previous 12 months J.  [place] such as hearing and vision if needed, cognitive and depression L. Referrals and appointments   In addition, I have reviewed and discussed with patient certain preventive protocols, quality metrics, and best practice recommendations. A written personalized care plan for preventive services as well as general preventive health recommendations were provided to patient.   Signed,    Bevelyn Ngo, LPN  02/18/4141 Nurse Health Advisor   Nurse Notes: none

## 2019-02-20 NOTE — Patient Instructions (Signed)
Savannah Mcclure , Thank you for taking time to come for your Medicare Wellness Visit. I appreciate your ongoing commitment to your health goals. Please review the following plan we discussed and let me know if I can assist you in the future.   Screening recommendations/referrals: Colonoscopy: not indicated  Mammogram: not indicated  Bone Density: completed 08/2010 Recommended yearly ophthalmology/optometry visit for glaucoma screening and checkup Recommended yearly dental visit for hygiene and checkup  Vaccinations: Influenza vaccine: up to date  Pneumococcal vaccine: pneumovax 23 due 08/2019 Tdap vaccine: up to date  Shingles vaccine: shingrix eligible, check with your pharmacist   Advanced directives: please pick up a copy of this information next time your in the office.   Conditions/risks identified: If you wish to quit smoking, help is available. For free tobacco cessation program offerings call the [facility] at 548-219-2346 or Live Well Line at (224) 545-9917. You may also visit [email or web address] or email [email or web address] for more information on other programs.   Next appointment: follow up in one year for your annual wellness exam.    Preventive Care 79 Years and Older, Female Preventive care refers to lifestyle choices and visits with your health care provider that can promote health and wellness. What does preventive care include?  A yearly physical exam. This is also called an annual well check.  Dental exams once or twice a year.  Routine eye exams. Ask your health care provider how often you should have your eyes checked.  Personal lifestyle choices, including:  Daily care of your teeth and gums.  Regular physical activity.  Eating a healthy diet.  Avoiding tobacco and drug use.  Limiting alcohol use.  Practicing safe sex.  Taking low-dose aspirin every day.  Taking vitamin and mineral supplements as recommended by your health  care provider. What happens during an annual well check? The services and screenings done by your health care provider during your annual well check will depend on your age, overall health, lifestyle risk factors, and family history of disease. Counseling  Your health care provider may ask you questions about your:  Alcohol use.  Tobacco use.  Drug use.  Emotional well-being.  Home and relationship well-being.  Sexual activity.  Eating habits.  History of falls.  Memory and ability to understand (cognition).  Work and work Statistician.  Reproductive health. Screening  You may have the following tests or measurements:  Height, weight, and BMI.  Blood pressure.  Lipid and cholesterol levels. These may be checked every 5 years, or more frequently if you are over 32 years old.  Skin check.  Lung cancer screening. You may have this screening every year starting at age 66 if you have a 30-pack-year history of smoking and currently smoke or have quit within the past 15 years.  Fecal occult blood test (FOBT) of the stool. You may have this test every year starting at age 51.  Flexible sigmoidoscopy or colonoscopy. You may have a sigmoidoscopy every 5 years or a colonoscopy every 10 years starting at age 35.  Hepatitis C blood test.  Hepatitis B blood test.  Sexually transmitted disease (STD) testing.  Diabetes screening. This is done by checking your blood sugar (glucose) after you have not eaten for a while (fasting). You may have this done every 1-3 years.  Bone density scan. This is done to screen for osteoporosis. You may have this done starting at age 39.  Mammogram. This may be done every 1-2 years. Talk to  your health care provider about how often you should have regular mammograms. Talk with your health care provider about your test results, treatment options, and if necessary, the need for more tests. Vaccines  Your health care provider may recommend certain  vaccines, such as:  Influenza vaccine. This is recommended every year.  Tetanus, diphtheria, and acellular pertussis (Tdap, Td) vaccine. You may need a Td booster every 10 years.  Zoster vaccine. You may need this after age 50.  Pneumococcal 13-valent conjugate (PCV13) vaccine. One dose is recommended after age 63.  Pneumococcal polysaccharide (PPSV23) vaccine. One dose is recommended after age 49. Talk to your health care provider about which screenings and vaccines you need and how often you need them. This information is not intended to replace advice given to you by your health care provider. Make sure you discuss any questions you have with your health care provider. Document Released: 08/28/2015 Document Revised: 04/20/2016 Document Reviewed: 06/02/2015 Elsevier Interactive Patient Education  2017 [employer] Prevention in the Home Falls can cause injuries. They can happen to people of all ages. There are many things you can do to make your home safe and to help prevent falls. What can I do on the outside of my home?  Regularly fix the edges of walkways and driveways and fix any cracks.  Remove anything that might make you trip as you walk through a door, such as a raised step or threshold.  Trim any bushes or trees on the path to your home.  Use bright outdoor lighting.  Clear any walking paths of anything that might make someone trip, such as rocks or tools.  Regularly check to see if handrails are loose or broken. Make sure that both sides of any steps have handrails.  Any raised decks and porches should have guardrails on the edges.  Have any leaves, snow, or ice cleared regularly.  Use sand or salt on walking paths during winter.  Clean up any spills in your garage right away. This includes oil or grease spills. What can I do in the bathroom?  Use night lights.  Install grab bars by the toilet and in the tub and shower. Do not use towel bars as grab  bars.  Use non-skid mats or decals in the tub or shower.  If you need to sit down in the shower, use a plastic, non-slip stool.  Keep the floor dry. Clean up any water that spills on the floor as soon as it happens.  Remove soap buildup in the tub or shower regularly.  Attach bath mats securely with double-sided non-slip rug tape.  Do not have throw rugs and other things on the floor that can make you trip. What can I do in the bedroom?  Use night lights.  Make sure that you have a light by your bed that is easy to reach.  Do not use any sheets or blankets that are too big for your bed. They should not hang down onto the floor.  Have a firm chair that has side arms. You can use this for support while you get dressed.  Do not have throw rugs and other things on the floor that can make you trip. What can I do in the kitchen?  Clean up any spills right away.  Avoid walking on wet floors.  Keep items that you use a lot in easy-to-reach places.  If you need to reach something above you, use a strong step stool that has  a grab bar.  Keep electrical cords out of the way.  Do not use floor polish or wax that makes floors slippery. If you must use wax, use non-skid floor wax.  Do not have throw rugs and other things on the floor that can make you trip. What can I do with my stairs?  Do not leave any items on the stairs.  Make sure that there are handrails on both sides of the stairs and use them. Fix handrails that are broken or loose. Make sure that handrails are as long as the stairways.  Check any carpeting to make sure that it is firmly attached to the stairs. Fix any carpet that is loose or worn.  Avoid having throw rugs at the top or bottom of the stairs. If you do have throw rugs, attach them to the floor with carpet tape.  Make sure that you have a light switch at the top of the stairs and the bottom of the stairs. If you do not have them, ask someone to add them for  you. What else can I do to help prevent falls?  Wear shoes that:  Do not have high heels.  Have rubber bottoms.  Are comfortable and fit you well.  Are closed at the toe. Do not wear sandals.  If you use a stepladder:  Make sure that it is fully opened. Do not climb a closed stepladder.  Make sure that both sides of the stepladder are locked into place.  Ask someone to hold it for you, if possible.  Clearly mark and make sure that you can see:  Any grab bars or handrails.  First and last steps.  Where the edge of each step is.  Use tools that help you move around (mobility aids) if they are needed. These include:  Canes.  Walkers.  Scooters.  Crutches.  Turn on the lights when you go into a dark area. Replace any light bulbs as soon as they burn out.  Set up your furniture so you have a clear path. Avoid moving your furniture around.  If any of your floors are uneven, fix them.  If there are any pets around you, be aware of where they are.  Review your medicines with your doctor. Some medicines can make you feel dizzy. This can increase your chance of falling. Ask your doctor what other things that you can do to help prevent falls. This information is not intended to replace advice given to you by your health care provider. Make sure you discuss any questions you have with your health care provider. Document Released: 05/28/2009 Document Revised: 01/07/2016 Document Reviewed: 09/05/2014 Elsevier Interactive Patient Education  2017 [employer].

## 2019-02-21 ENCOUNTER — Encounter: Payer: Self-pay | Admitting: Family Medicine

## 2019-02-21 ENCOUNTER — Ambulatory Visit (INDEPENDENT_AMBULATORY_CARE_PROVIDER_SITE_OTHER): Payer: Medicare Other | Admitting: Family Medicine

## 2019-02-21 ENCOUNTER — Other Ambulatory Visit: Payer: Self-pay

## 2019-02-21 VITALS — BP 141/55 | HR 135

## 2019-02-21 DIAGNOSIS — I6523 Occlusion and stenosis of bilateral carotid arteries: Secondary | ICD-10-CM

## 2019-02-21 DIAGNOSIS — J410 Simple chronic bronchitis: Secondary | ICD-10-CM | POA: Diagnosis not present

## 2019-02-21 NOTE — Assessment & Plan Note (Signed)
Doing well. Discussed the dangers of long-term use of prednisone. Will call if in exacerbation. Call with any concerns.

## 2019-02-21 NOTE — Progress Notes (Signed)
BP (!) 141/55   Pulse (!) 135    Subjective:    Patient ID: Savannah Mcclure, female    DOB: Jan 04, 1941, 78 y.o.   MRN: [id number]  HPI: Savannah Mcclure is a 78 y.o. female  Chief Complaint  Patient presents with  . Medication Refill    Pt requesting prednisode refill; Patient states it really helps with her breathing and her arthritis.    Savannah is requesting a refill on her prednisone as she feels like it makes her joints and breathing better. She is doing well with her breathing right now. No SOB. No cough. No wheezing. She has had many episodes of COPD exacerbations ain the past and does not feel like she is in one right now.   She notes that her back is hurting because she has not been able get shots from pain management. She is going to get one next week. She is otherwise doing well with no other concerns or complaints at this time.   Relevant past medical, surgical, family and social history reviewed and updated as indicated. Interim medical history since our last visit reviewed. Allergies and medications reviewed and updated.  Review of Systems  Constitutional: Negative.   Respiratory: Negative.   Cardiovascular: Negative.   Gastrointestinal: Negative.   Musculoskeletal: Positive for back pain, gait problem and myalgias. Negative for arthralgias, joint swelling, neck pain and neck stiffness.  Skin: Negative.   Neurological: Negative for dizziness, tremors, seizures, syncope, facial asymmetry, speech difficulty, weakness, light-headedness, numbness and headaches.  Psychiatric/Behavioral: Negative.     Per HPI unless specifically indicated above     Objective:    BP (!) 141/55   Pulse (!) 135   Wt Readings from Last 3 Encounters:  01/15/19 118 lb (53.5 kg)  12/27/18 115 lb (52.2 kg)  11/22/18 115 lb (52.2 kg)    Physical Exam Vitals signs and nursing note reviewed.  Constitutional:      General: She is not in acute distress.    Appearance:  Normal appearance. She is not ill-appearing, toxic-appearing or diaphoretic.  HENT:     Head: Normocephalic and atraumatic.     Right Ear: External ear normal.     Left Ear: External ear normal.     Nose: Nose normal.     Mouth/Throat:     Mouth: Mucous membranes are moist.     Pharynx: Oropharynx is clear.  Eyes:     General: No scleral icterus.       Right eye: No discharge.        Left eye: No discharge.     Conjunctiva/sclera: Conjunctivae normal.     Pupils: Pupils are equal, round, and reactive to light.  Neck:     Musculoskeletal: Normal range of motion.  Pulmonary:     Effort: Pulmonary effort is normal. No respiratory distress.     Comments: Speaking in full sentences Musculoskeletal: Normal range of motion.  Skin:    Coloration: Skin is not jaundiced or pale.     Findings: No bruising, erythema, lesion or rash.  Neurological:     Mental Status: She is alert and oriented to person, place, and time. Mental status is at baseline.  Psychiatric:        Mood and Affect: Mood normal.        Behavior: Behavior normal.        Thought Content: Thought content normal.        Judgment: Judgment normal.  Results for orders placed or performed in visit on 12/27/18  Novel Coronavirus, NAA ([facility])  Result Value Ref Range   SARS-CoV-2, NAA Not Detected Not Detected      Assessment & Plan:   Problem List Items Addressed This Visit      Respiratory   COPD (chronic obstructive pulmonary disease) ([place]) - Primary    Doing well. Discussed the dangers of long-term use of prednisone. Will call if in exacerbation. Call with any concerns.          Follow up plan: Return As scheduled.   . This visit was completed via Doximity due to the restrictions of the COVID-19 pandemic. All issues as above were discussed and addressed. Physical exam was done as above through visual confirmation on Doximity. If it was felt that the patient should be evaluated in the office, they were  directed there. The patient verbally consented to this visit. . Location of the patient: home . Location of the provider: work . Those involved with this call:  . Provider: Park Liter, DO . CMA: Yvonna Alanis, [place] . Front Desk/Registration: Don Perking  . Time spent on call: 10 minutes with patient face to face via video conference. More than 50% of this time was spent in counseling and coordination of care. 15 minutes total spent in review of patient's record and preparation of their chart.

## 2019-02-22 IMAGING — DX DG CHEST 1V PORT
1 series · 1 of 1 positions shown · non-contrast
Comparison: 08/25/2017 and prior exams

CLINICAL DATA: Fever and shortness of breath for 2 days.

EXAM:
PORTABLE CHEST 1 VIEW

[chest ap]
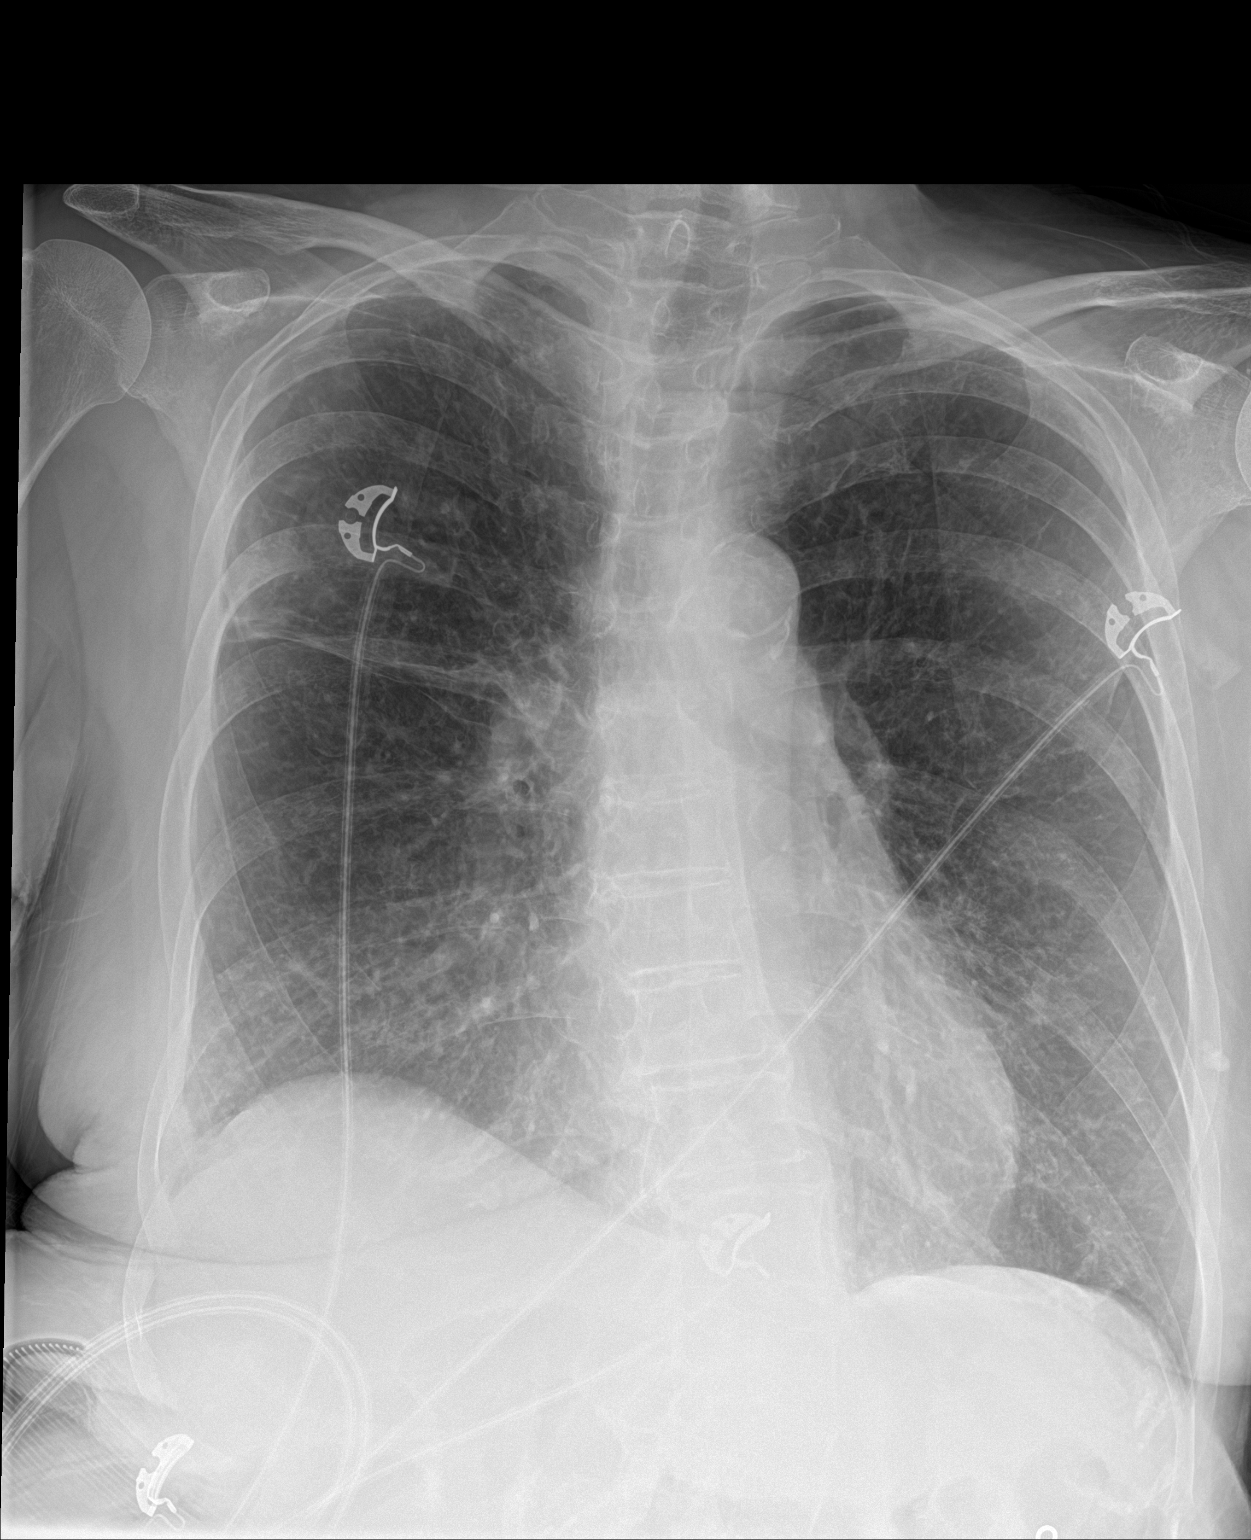

[1 of 1 positions shown; findings below may reference images not displayed]

FINDINGS: Cardiomediastinal silhouette is unchanged.

Interstitial prominence is again noted. No new pulmonary opacities
are identified.

A possible trace RIGHT pleural effusion again noted.

There is no evidence of pneumothorax or acute bony abnormality.
IMPRESSION: 1. No evidence of acute cardiopulmonary disease
2. Stable pulmonary opacities and interstitial prominence with
possible trace RIGHT pleural effusion.

## 2019-02-25 ENCOUNTER — Other Ambulatory Visit: Payer: Self-pay

## 2019-02-25 ENCOUNTER — Ambulatory Visit (INDEPENDENT_AMBULATORY_CARE_PROVIDER_SITE_OTHER): Payer: Medicare Other | Admitting: Nurse Practitioner

## 2019-02-25 ENCOUNTER — Telehealth: Payer: Self-pay | Admitting: *Deleted

## 2019-02-25 ENCOUNTER — Other Ambulatory Visit: Payer: Medicare Other

## 2019-02-25 ENCOUNTER — Encounter: Payer: Self-pay | Admitting: Nurse Practitioner

## 2019-02-25 ENCOUNTER — Telehealth: Payer: Self-pay | Admitting: Family Medicine

## 2019-02-25 DIAGNOSIS — Z20822 Contact with and (suspected) exposure to covid-19: Secondary | ICD-10-CM

## 2019-02-25 DIAGNOSIS — R6889 Other general symptoms and signs: Secondary | ICD-10-CM | POA: Diagnosis not present

## 2019-02-25 DIAGNOSIS — I6523 Occlusion and stenosis of bilateral carotid arteries: Secondary | ICD-10-CM

## 2019-02-25 DIAGNOSIS — R059 Cough, unspecified: Secondary | ICD-10-CM

## 2019-02-25 DIAGNOSIS — R05 Cough: Secondary | ICD-10-CM | POA: Diagnosis not present

## 2019-02-25 MED ORDER — CEFUROXIME AXETIL 250 MG PO TABS: 250.0000 mg | ORAL_TABLET | Freq: Two times a day (BID) | ORAL | 0 refills | Status: DC

## 2019-02-25 NOTE — Progress Notes (Signed)
There were no vitals taken for this visit.   Subjective:    Patient ID: Savannah Mcclure, female    DOB: 31-Jan-1941, 78 y.o.   MRN: [id number]  HPI: Savannah Mcclure is a 78 y.o. female  Chief Complaint  Patient presents with  . URI    pt states she has had a cough, fever, SOB, and aches that started 02/22/19    . This visit was completed via Doximity due to the restrictions of the COVID-19 pandemic. All issues as above were discussed and addressed. Physical exam was done as above through visual confirmation on Doximity. If it was felt that the patient should be evaluated in the office, they were directed there. The patient verbally consented to this visit. . Location of the patient: home . Location of the provider: work . Those involved with this call:  . Provider: Marnee Guarneri, DNP . CMA: Gerda Diss, CMA . Front Desk/Registration: Jill Side  . Time spent on call: 15 minutes with patient face to face via video conference. More than 50% of this time was spent in counseling and coordination of care. 10 minutes total spent in review of patient's record and preparation of their chart. I verified patient identity using two factors (patient name and date of birth). Patient consents verbally to being seen via telemedicine visit today.   UPPER RESPIRATORY TRACT INFECTION Started with symptoms last week, saw Dr. Wynetta Emery 02/21/19 and reports symptoms started after this.  Has history of exacerbations with COPD.  Just completed Prednisone.  Has been out and about running errands and unsure if exposure to Covid, does wear mask and use hand sanitizer.  No recent travel overseas.  Fever: yes Cough: yes Shortness of breath: yes Wheezing: no Chest pain: no Chest tightness: no Chest congestion: no Nasal congestion: yes Runny nose: yes Post nasal drip: yes Sneezing: yes Sore throat: yes Swollen glands: no Sinus pressure: no Headache: no Face pain: no Toothache: no Ear  pain: none Ear pressure: yes "right Eyes red/itching:no Eye drainage/crusting: no  Vomiting: no Rash: no Fatigue: yes Sick contacts: no Strep contacts: no  Context: fluctuating Recurrent sinusitis: no Relief with OTC cold/cough medications: no  Treatments attempted: none   Relevant past medical, surgical, family and social history reviewed and updated as indicated. Interim medical history since our last visit reviewed. Allergies and medications reviewed and updated.  Review of Systems  Constitutional: Positive for chills, fatigue and fever. Negative for activity change, appetite change and diaphoresis.  HENT: Positive for congestion, postnasal drip, rhinorrhea, sinus pressure and sore throat. Negative for ear discharge, ear pain, facial swelling, sinus pain, sneezing and voice change.   Eyes: Negative for pain and visual disturbance.  Respiratory: Positive for cough. Negative for chest tightness, shortness of breath and wheezing.   Cardiovascular: Negative for chest pain, palpitations and leg swelling.  Gastrointestinal: Negative for abdominal distention, abdominal pain, constipation, diarrhea, nausea and vomiting.  Endocrine: Negative.   Musculoskeletal: Positive for myalgias.  Neurological: Negative for dizziness, numbness and headaches.  Psychiatric/Behavioral: Negative.     Per HPI unless specifically indicated above     Objective:    There were no vitals taken for this visit.  Wt Readings from Last 3 Encounters:  01/15/19 118 lb (53.5 kg)  12/27/18 115 lb (52.2 kg)  11/22/18 115 lb (52.2 kg)    Physical Exam Vitals signs and nursing note reviewed.  Constitutional:      General: She is awake. She is not in acute  distress.    Appearance: She is well-developed. She is ill-appearing.  HENT:     Head: Normocephalic.     Right Ear: Hearing normal. No drainage.     Left Ear: Hearing normal. No drainage.     Nose: Rhinorrhea present. Rhinorrhea is clear.     Right  Sinus: No maxillary sinus tenderness or frontal sinus tenderness.     Left Sinus: No maxillary sinus tenderness or frontal sinus tenderness.     Mouth/Throat:     Pharynx: Posterior oropharyngeal erythema (mild with cobblestoning) present. No pharyngeal swelling or oropharyngeal exudate.  Eyes:     General: Lids are normal.        Right eye: No discharge.        Left eye: No discharge.     Conjunctiva/sclera: Conjunctivae normal.  Neck:     Musculoskeletal: Normal range of motion.  Cardiovascular:     Comments: .Unable to auscultate due to virtual exam only Pulmonary:     Effort: Pulmonary effort is normal. No accessory muscle usage or respiratory distress.     Comments: Unable to auscultate due to virtual exam only.  Intermittent productive cough present with no SOB with talking.  Hoarse voice. Neurological:     Mental Status: She is alert and oriented to person, place, and time.  Psychiatric:        Attention and Perception: Attention normal.        Mood and Affect: Mood normal.        Behavior: Behavior normal. Behavior is cooperative.        Thought Content: Thought content normal.        Judgment: Judgment normal.     Results for orders placed or performed in visit on 12/27/18  Novel Coronavirus, NAA ([facility])  Result Value Ref Range   SARS-CoV-2, NAA Not Detected Not Detected      Assessment & Plan:   Problem List Items Addressed This Visit      Other   Cough    Acute and recently completed Prednisone.  [facility] message left for COVID testing due to current symptoms.  Script sent for Ceftin (has been treated with this in past for COPD exacerbation).  Have recommended maintaining a 14 day at home quarantine at this time based on current symptoms.  Patient agrees with this POC.  They will return in 2 weeks for follow-up.  Discussed that if worsening she is immediately to go to nearest ER or UC for face to face visit.         I discussed the assessment and treatment plan  with the patient. The patient was provided an opportunity to ask questions and all were answered. The patient agreed with the plan and demonstrated an understanding of the instructions.   The patient was advised to call back or seek an in-person evaluation if the symptoms worsen or if the condition fails to improve as anticipated.   I provided 15 minutes of time during this encounter.  Follow up plan: Return in about 2 weeks (around 03/11/2019) for Follow-up cough.

## 2019-02-25 NOTE — Assessment & Plan Note (Signed)
Acute and recently completed Prednisone.  [facility] message left for COVID testing due to current symptoms.  Script sent for Ceftin (has been treated with this in past for COPD exacerbation).  Have recommended maintaining a 14 day at home quarantine at this time based on current symptoms.  Patient agrees with this POC.  They will return in 2 weeks for follow-up.  Discussed that if worsening she is immediately to go to nearest ER or UC for face to face visit.

## 2019-02-25 NOTE — Telephone Encounter (Signed)
Patient called stating she would like to get a covid test done.  Patient had a fever on 7/12 of 100.7 and today7/13 99.8.  Patient is also experiencing coughing.  Patient would like some medication regardless for symptoms.  Call back [phone]

## 2019-02-25 NOTE — Telephone Encounter (Signed)
Scheduled patient for COVID 19 test today at 1:30 pm at [facility].  Testing protocol reviewed with patient.

## 2019-02-25 NOTE — Telephone Encounter (Signed)
Noted  

## 2019-02-25 NOTE — Patient Instructions (Signed)

## 2019-02-25 NOTE — Telephone Encounter (Signed)
-----   Message from Venita Lick, NP sent at 02/25/2019 11:31 AM EDT ----- NAME: Jule Ser  DOB: March 03, 1941  MRN: [id number]  Insurance:  Medicare  Reason: COPD with symptoms of Covid fever, chills, SOB, cough.

## 2019-02-27 ENCOUNTER — Telehealth: Payer: Self-pay | Admitting: Pain Medicine

## 2019-02-27 ENCOUNTER — Other Ambulatory Visit
Admission: RE | Admit: 2019-02-27 | Discharge: 2019-02-27 | Disposition: A | Discharge status: Home / Self Care | Payer: Medicare Other | Source: Ambulatory Visit | Attending: Student in an Organized Health Care Education/Training Program | Admitting: Student in an Organized Health Care Education/Training Program

## 2019-02-27 NOTE — Telephone Encounter (Signed)
Patient is having symptoms of Covid and had a test on Monday. She is waiting for results. Should she cancel her RFA scheduled for Mon 7-20 ? Please let patient know

## 2019-02-28 LAB — NOVEL CORONAVIRUS, NAA: SARS-CoV-2, NAA: NOT DETECTED

## 2019-03-04 ENCOUNTER — Other Ambulatory Visit: Payer: Self-pay

## 2019-03-04 ENCOUNTER — Encounter: Payer: Self-pay | Admitting: Student in an Organized Health Care Education/Training Program

## 2019-03-04 ENCOUNTER — Ambulatory Visit
Admission: RE | Admit: 2019-03-04 | Discharge: 2019-03-04 | Disposition: A | Discharge status: Home / Self Care | Payer: Medicare Other | Source: Ambulatory Visit | Attending: Student in an Organized Health Care Education/Training Program | Admitting: Student in an Organized Health Care Education/Training Program

## 2019-03-04 ENCOUNTER — Ambulatory Visit (HOSPITAL_BASED_OUTPATIENT_CLINIC_OR_DEPARTMENT_OTHER): Payer: Medicare Other | Admitting: Student in an Organized Health Care Education/Training Program

## 2019-03-04 DIAGNOSIS — M47814 Spondylosis without myelopathy or radiculopathy, thoracic region: Secondary | ICD-10-CM | POA: Diagnosis not present

## 2019-03-04 MED ORDER — FENTANYL CITRATE (PF) 100 MCG/2ML IJ SOLN
INTRAMUSCULAR | Status: AC
  Filled 2019-03-04: qty 2

## 2019-03-04 MED ORDER — ROPIVACAINE HCL 2 MG/ML IJ SOLN
INTRAMUSCULAR | Status: AC
  Filled 2019-03-04: qty 10

## 2019-03-04 MED ORDER — LIDOCAINE HCL 2 % IJ SOLN
20.0000 mL | Freq: Once | INTRAMUSCULAR | Status: AC
  Administered 2019-03-04: 400 mg

## 2019-03-04 MED ORDER — DEXAMETHASONE SODIUM PHOSPHATE 10 MG/ML IJ SOLN
INTRAMUSCULAR | Status: AC
  Filled 2019-03-04: qty 1

## 2019-03-04 MED ORDER — LIDOCAINE HCL 2 % IJ SOLN
INTRAMUSCULAR | Status: AC
  Filled 2019-03-04: qty 20

## 2019-03-04 MED ORDER — DEXAMETHASONE SODIUM PHOSPHATE 10 MG/ML IJ SOLN
10.0000 mg | Freq: Once | INTRAMUSCULAR | Status: AC
  Administered 2019-03-04: 10 mg
  Filled 2019-03-04: qty 1

## 2019-03-04 MED ORDER — FENTANYL CITRATE (PF) 100 MCG/2ML IJ SOLN
25.0000 ug | INTRAMUSCULAR | Status: DC | PRN
  Administered 2019-03-04: 13:00:00 via INTRAVENOUS

## 2019-03-04 MED ORDER — ROPIVACAINE HCL 2 MG/ML IJ SOLN
1.0000 mL | Freq: Once | INTRAMUSCULAR | Status: AC
  Administered 2019-03-04: 13:00:00 10 mL via EPIDURAL
  Filled 2019-03-04: qty 10

## 2019-03-04 NOTE — Patient Instructions (Signed)

## 2019-03-04 NOTE — Progress Notes (Signed)
Safety precautions to be maintained throughout the outpatient stay will include: orient to surroundings, keep bed in low position, maintain call bell within reach at all times, provide assistance with transfer out of bed and ambulation.  

## 2019-03-04 NOTE — Progress Notes (Signed)
Patient's Name: Savannah Mcclure  MRN: [id number]  Referring Provider: Gillis Santa, MD  DOB: [birth year]  PCP: [name], DO  DOS: 03/04/2019  Note by: Gillis Santa, MD  Service setting: Ambulatory outpatient  Specialty: Interventional Pain Management  Patient type: Established  Location: [facility] (AMB) Pain Management Facility  Visit type: Interventional Procedure   Primary Reason for Visit: Interventional Pain Management Treatment. CC: Back Pain (mid, lower)  Procedure:          Anesthesia, Analgesia, Anxiolysis:  Type: Therapeutic Thoracic Medial Nerve Radiofrequency Ablation  #2 (#1 Done 04/2018) Region: Posterior   Level: T11-12, T12-L1 and  L1-L2 Medial Branch Level(s) Laterality: Bilateral Paraspinal  Type: Moderate (Conscious) Sedation combined with Local Anesthesia Indication(s): Analgesia and Anxiety Route: Intravenous (IV) IV Access: Secured Sedation: Meaningful verbal contact was maintained at all times during the procedure  Local Anesthetic: Lidocaine 1-2%   Position: Prone   Indications: 1. Thoracic spondylosis without myelopathy    Savannah Mcclure has been dealing with the above chronic pain for longer than three months and has either failed to respond, was unable to tolerate, or simply did not get enough benefit from other more conservative therapies including, but not limited to: 1. Over-the-counter medications 2. Anti-inflammatory medications 3. Muscle relaxants 4. Membrane stabilizers 5. Opioids 6. Physical therapy and/or chiropractic manipulation 7. Modalities (Heat, ice, etc.) 8. Invasive techniques such as nerve blocks. Savannah Mcclure has attained more than 50% relief of the pain from a series of diagnostic injections conducted in separate occasions.  Pain Score: Pre-procedure: 4 /10 Post-procedure: 0-No pain/10  Pre-op Assessment:  Savannah Mcclure is a 78 y.o. (year old), female patient, seen today for interventional treatment. She  has  a past surgical history that includes right eye surgery; Wisdom tooth extraction; Back surgery; Abdominal hysterectomy; Breast lumpectomy (Left); and Lower Extremity Angiography (Right, 06/20/2018). Savannah Mcclure has a current medication list which includes the following prescription(s): b complex-c, benazepril, cefuroxime, cilostazol, cyclobenzaprine, gabapentin, hydroxyzine, ipratropium-albuterol, lidocaine, magnesium chloride, montelukast, multivitamin with minerals, niacinamide, omeprazole, oxycodone, potassium citrate, UNABLE TO FIND, UNABLE TO FIND, UNABLE TO FIND, gabapentin, and tiotropium, and the following Facility-Administered Medications: fentanyl. Her primarily concern today is the Back Pain (mid, lower)  Initial Vital Signs:  Pulse/HCG Rate: (!) 138ECG Heart Rate: (!) 139 Temp: 97.6 F (36.4 C) Resp: 20 BP: (!) 147/68 SpO2: 98 %  BMI: Estimated body mass index is 21.73 kg/m as calculated from the following:   Height as of this encounter: 5\' 1"  (1.549 m).   Weight as of this encounter: 115 lb (52.2 kg).  Risk Assessment: Allergies: Reviewed. She is allergic to other; amlodipine; augmentin [amoxicillin-pot clavulanate]; azithromycin; contrast media [iodinated diagnostic agents]; doxycycline; erythromycin; hydrocodone; lactose intolerance (gi); minocycline; nsaids; relafen [nabumetone]; sulfa antibiotics; and tetracyclines & related.  Allergy Precautions: None required Coagulopathies: Reviewed. None identified.  Blood-thinner therapy: None at this time Active Infection(s): Reviewed. None identified. Savannah Mcclure is afebrile  Site Confirmation: Savannah Mcclure was asked to confirm the procedure and laterality before marking the site Procedure checklist: Completed Consent: Before the procedure and under the influence of no sedative(s), amnesic(s), or anxiolytics, the patient was informed of the treatment options, risks and possible complications. To fulfill our ethical and  legal obligations, as recommended by the American Medical Association's Code of Ethics, I have informed the patient of my clinical impression; the nature and purpose of the treatment or procedure; the risks, benefits, and possible complications of the intervention; the alternatives, including doing nothing;  the risk(s) and benefit(s) of the alternative treatment(s) or procedure(s); and the risk(s) and benefit(s) of doing nothing. The patient was provided information about the general risks and possible complications associated with the procedure. These may include, but are not limited to: failure to achieve desired goals, infection, bleeding, organ or nerve damage, allergic reactions, paralysis, and death. In addition, the patient was informed of those risks and complications associated to Spine-related procedures, such as failure to decrease pain; infection (i.e.: Meningitis, epidural or intraspinal abscess); bleeding (i.e.: epidural hematoma, subarachnoid hemorrhage, or any other type of intraspinal or peri-dural bleeding); organ or nerve damage (i.e.: Any type of peripheral nerve, nerve root, or spinal cord injury) with subsequent damage to sensory, motor, and/or autonomic systems, resulting in permanent pain, numbness, and/or weakness of one or several areas of the body; allergic reactions; (i.e.: anaphylactic reaction); and/or death. Furthermore, the patient was informed of those risks and complications associated with the medications. These include, but are not limited to: allergic reactions (i.e.: anaphylactic or anaphylactoid reaction(s)); adrenal axis suppression; blood sugar elevation that in diabetics may result in ketoacidosis or comma; water retention that in patients with history of congestive heart failure may result in shortness of breath, pulmonary edema, and decompensation with resultant heart failure; weight gain; swelling or edema; medication-induced neural toxicity; particulate matter  embolism and blood vessel occlusion with resultant organ, and/or nervous system infarction; and/or aseptic necrosis of one or more joints. Finally, the patient was informed that Medicine is not an exact science; therefore, there is also the possibility of unforeseen or unpredictable risks and/or possible complications that may result in a catastrophic outcome. The patient indicated having understood very clearly. We have given the patient no guarantees and we have made no promises. Enough time was given to the patient to ask questions, all of which were answered to the patient's satisfaction. Savannah Mcclure has indicated that she wanted to continue with the procedure. Attestation: I, the ordering provider, attest that I have discussed with the patient the benefits, risks, side-effects, alternatives, likelihood of achieving goals, and potential problems during recovery for the procedure that I have provided informed consent. Date  Time: 03/04/2019 11:24 AM  Pre-Procedure Preparation:  Monitoring: As per clinic protocol. Respiration, ETCO2, SpO2, BP, heart rate and rhythm monitor placed and checked for adequate function Safety Precautions: Patient was assessed for positional comfort and pressure points before starting the procedure. Time-out: I initiated and conducted the "Time-out" before starting the procedure, as per protocol. The patient was asked to participate by confirming the accuracy of the "Time Out" information. Verification of the correct person, site, and procedure were performed and confirmed by me, the nursing staff, and the patient. "Time-out" conducted as per Joint Commission's Universal Protocol (UP.01.01.01). Time: 1205  Description of Procedure:          Target Area: For thoracic and lumbar Facet block(s), the target is the groove formed by the junction of the transverse process and superior articular process.  Approach: Paraspinal approach. Area Prepped: Entire Upper Back  Area Prepping solution: Duraprep (Iodine Povacrylex [0.7% available Iodine] and Isopropyl Alcohol, 74% w/w) Safety Precautions: Aspiration looking for blood return was conducted prior to all injections. At no point did we inject any substances, as a needle was being advanced. No attempts were made at seeking any paresthesias. Safe injection practices and needle disposal techniques used. Medications properly checked for expiration dates. SDV (single dose vial) medications used. Description of the Procedure: Protocol guidelines were followed. The patient was  placed in position over the procedure table. The target area was identified and the area prepped in the usual manner. The skin and muscle were infiltrated with local anesthetic. Appropriate amount of time allowed to pass for local anesthetics to take effect. Radiofrequency needles were introduced to the target area using fluoroscopic guidance. Using the NeuroTherm NT1100 Radiofrequency Generator, sensory stimulation using 50 Hz was used to locate & identify the nerve, making sure that the needle was positioned such that there was no sensory stimulation below 0.3 V or above 0.7 V. Stimulation using 2 Hz was used to evaluate the motor component. Care was taken not to lesion any nerves that demonstrated motor stimulation of the lower extremities at an output of less than 2.5 times that of the sensory threshold, or a maximum of 2.0 V. Once satisfactory placement of the needles was achieved, the numbing solution was slowly injected after negative aspiration. After waiting for at least 2 minutes, the ablation was performed at 80 degrees C for 60 seconds, using regular Radiofrequency settings. Once the procedure was completed, the needles were then removed and the area cleansed, making sure to leave some of the prepping solution back to take advantage of its long term bactericidal properties. Intra-operative Compliance: Compliant Vitals:   03/04/19 1240 03/04/19  1250 03/04/19 1300 03/04/19 1310  BP: (!) 145/76 (!) 143/71 134/61 (!) 129/93  Pulse:      Resp: 13 17 15 20   Temp:      SpO2: 100% 100% 98% 99%  Weight:      Height:        Start Time: 1205 hrs. End Time: 1243 hrs. Materials & Medications:  Needle(s) Type: Teflon-coated, curved tip, Radiofrequency needle(s) Gauge: 22G Length: 10cm Medication(s): Please see orders for medications and dosing details. 6 cc solution made of 5 cc of 0.2% ropivacaine, 1 cc of Decadron 10 mg/cc.  1 cc injected at each level above bilaterally prior to lesioning (after sensory and motor testing).  Imaging Guidance (Spinal):          Type of Imaging Technique: Fluoroscopy Guidance (Spinal) Indication(s): Assistance in needle guidance and placement for procedures requiring needle placement in or near specific anatomical locations not easily accessible without such assistance. Exposure Time: Please see nurses notes. Contrast: None used. Fluoroscopic Guidance: I was personally present during the use of fluoroscopy. "Tunnel Vision Technique" used to obtain the best possible view of the target area. Parallax error corrected before commencing the procedure. "Direction-depth-direction" technique used to introduce the needle under continuous pulsed fluoroscopy. Once target was reached, antero-posterior, oblique, and lateral fluoroscopic projection used confirm needle placement in all planes. Images permanently stored in EMR. Interpretation: No contrast injected. I personally interpreted the imaging intraoperatively. Adequate needle placement confirmed in multiple planes. Permanent images saved into the patient's record.  Antibiotic Prophylaxis:   Anti-infectives (From admission, onward)   None     Indication(s): None identified  Post-operative Assessment:  Post-procedure Vital Signs:  Pulse/HCG Rate: (!) 138(!) 120 Temp: 97.6 F (36.4 C) Resp: 20 BP: (!) 129/93 SpO2: 99 %  EBL: None  Complications: No  immediate post-treatment complications observed by team, or reported by patient.  Note: The patient tolerated the entire procedure well. A repeat set of vitals were taken after the procedure and the patient was kept under observation following institutional policy, for this type of procedure. Post-procedural neurological assessment was performed, showing return to baseline, prior to discharge. The patient was provided with post-procedure discharge instructions, including a section  on how to identify potential problems. Should any problems arise concerning this procedure, the patient was given instructions to immediately contact us, at any time, without hesitation. In any case, we plan to contact the patient by telephone for a follow-up status report regarding this interventional procedure.  Comments:  No additional relevant information.  Plan of Care    Imaging Orders     DG PAIN CLINIC C-ARM 1-60 MIN NO REPORT Procedure Orders    No procedure(s) ordered today    Medications ordered for procedure: Meds ordered this encounter  Medications  . fentaNYL (SUBLIMAZE) injection 25-50 mcg    Make sure Narcan is available in the pyxis when using this medication. In the event of respiratory depression (RR< 8/min): Titrate NARCAN (naloxone) in increments of 0.1 to 0.2 mg IV at 2-3 minute intervals, until desired degree of reversal.  . lidocaine (XYLOCAINE) 2 % (with pres) injection 400 mg  . ropivacaine (PF) 2 mg/mL (0.2%) (NAROPIN) injection 1 mL  . dexamethasone (DECADRON) injection 10 mg   Medications administered: We administered fentaNYL, lidocaine, ropivacaine (PF) 2 mg/mL (0.2%), and dexamethasone.  See the medical record for exact dosing, route, and time of administration.  New Prescriptions   No medications on file   Disposition: Discharge home  Discharge Date & Time: 03/04/2019; 1310 hrs.   Physician-requested Follow-up: Return in about 6 weeks (around 04/15/2019) for Post Procedure  Evaluation, virtual.  Future Appointments  Date Time Provider [place]  04/15/2019  1:30 PM Gillis Santa, MD [facility] None  04/25/2019  1:30 PM [name], DO [facility]  07/19/2019 10:00 AM [facility] VASC 3 [facility] None  07/19/2019 11:00 AM [facility] V[facility] 3 [facility] None  07/19/2019 11:30 AM Dew, Erskine Squibb, MD [facility] None   Primary Care Physician: [name], DO Location: [facility] Note by: Gillis Santa, MD Date: 03/04/2019; Time: 2:10 PM  Disclaimer:  Medicine is not an Chief Strategy Officer. The only guarantee in medicine is that nothing is guaranteed. It is important to note that the decision to proceed with this intervention was based on the information collected from the patient. The Data and conclusions were drawn from the patient's questionnaire, the interview, and the physical examination. Because the information was provided in large part by the patient, it cannot be guaranteed that it has not been purposely or unconsciously manipulated. Every effort has been made to obtain as much relevant data as possible for this evaluation. It is important to note that the conclusions that lead to this procedure are derived in large part from the available data. Always take into account that the treatment will also be dependent on availability of resources and existing treatment guidelines, considered by other Pain Management Practitioners as being common knowledge and practice, at the time of the intervention. For Medico-Legal purposes, it is also important to point out that variation in procedural techniques and pharmacological choices are the acceptable norm. The indications, contraindications, technique, and results of the above procedure should only be interpreted and judged by a Board-Certified Interventional Pain Specialist with extensive familiarity and expertise in the same exact procedure and technique.

## 2019-03-05 ENCOUNTER — Telehealth: Payer: Self-pay | Admitting: *Deleted

## 2019-03-05 NOTE — Telephone Encounter (Signed)
States doing "wonderful" . Denies any post procedure issues.

## 2019-03-10 ENCOUNTER — Other Ambulatory Visit: Payer: Self-pay | Admitting: Family Medicine

## 2019-03-10 NOTE — Telephone Encounter (Signed)
Requested medication (s) are due for refill today: yes  Requested medication (s) are on the active medication list: yes  Last refill:  01/30/18  Future visit scheduled: yes  Notes to clinic:  RX exp 01/30/19   Requested Prescriptions  Pending Prescriptions Disp Refills   SPIRIVA HANDIHALER 18 MCG inhalation capsule [Pharmacy Med Name: SPIRIVA HANDIHALER CAPS 18MCG] 90 capsule 3    Sig: PLACE 1 CAPSULE INTO INHALER AND INHALE [place]     Pulmonology:  Anticholinergic Agents Passed - 03/10/2019  6:14 AM      Passed - Valid encounter within last 12 months    Recent Outpatient Visits          1 week ago Cough   [facility] [place], Henrine Screws T, NP   2 weeks ago Simple chronic bronchitis ([place])   [facility] [place], Megan P, DO   2 months ago Simple chronic bronchitis ([place])   [facility] [place], [place], DO   2 months ago Suspected Covid-19 Virus Infection   [facility] [place], Megan P, DO   3 months ago Simple chronic bronchitis [facility])   [place], Barb Merino, DO      Future Appointments            In 2 days Wynetta Emery, Barb Merino, DO [employer], [place]   In 1 month [place], Barb Merino, DO [employer], [facility]

## 2019-03-12 ENCOUNTER — Other Ambulatory Visit: Payer: Self-pay

## 2019-03-12 ENCOUNTER — Encounter: Payer: Self-pay | Admitting: Family Medicine

## 2019-03-12 ENCOUNTER — Ambulatory Visit (INDEPENDENT_AMBULATORY_CARE_PROVIDER_SITE_OTHER): Payer: Medicare Other | Admitting: Family Medicine

## 2019-03-12 VITALS — BP 130/62 | HR 128 | Ht 61.0 in

## 2019-03-12 DIAGNOSIS — J410 Simple chronic bronchitis: Secondary | ICD-10-CM

## 2019-03-12 DIAGNOSIS — I6523 Occlusion and stenosis of bilateral carotid arteries: Secondary | ICD-10-CM | POA: Diagnosis not present

## 2019-03-12 DIAGNOSIS — F419 Anxiety disorder, unspecified: Secondary | ICD-10-CM

## 2019-03-12 MED ORDER — SPIRIVA HANDIHALER 18 MCG IN CAPS: ORAL_CAPSULE | RESPIRATORY_TRACT | 3 refills | Status: DC

## 2019-03-12 MED ORDER — OMEPRAZOLE 20 MG PO CPDR: 20.0000 mg | DELAYED_RELEASE_CAPSULE | Freq: Every day | ORAL | 1 refills | Status: DC

## 2019-03-12 MED ORDER — MONTELUKAST SODIUM 10 MG PO TABS: 10.0000 mg | ORAL_TABLET | Freq: Every day | ORAL | 3 refills | Status: DC

## 2019-03-12 NOTE — Progress Notes (Signed)
BP 130/62   Pulse (!) 128   Ht 5\' 1"  (1.549 m)   BMI 21.73 kg/m    Subjective:    Patient ID: Savannah Mcclure, female    DOB: Jul 14, 1941, 78 y.o.   MRN: [id number]  HPI: Savannah Mcclure is a 78 y.o. female  Chief Complaint  Patient presents with  . Cough    f/u   Savannah Mcclure presents today via virtual visit for follow up on her breathing. Never took the ceftin- did not realize that it was sent to the pharmacy. Breathing is doing OK. She notes that she coughs in the AM, but otherwise does OK. Not on prednisone. Seems to be doing fine. She is feeling back to herself. Notes that she had her nerve block done about a week ago. Feeling well. Back is feeling much better. Has been under a lot of stress with her family and her daughter. No other concerns or complaints at this time.   Relevant past medical, surgical, family and social history reviewed and updated as indicated. Interim medical history since our last visit reviewed. Allergies and medications reviewed and updated.  Review of Systems  Constitutional: Negative.   Cardiovascular: Negative.   Gastrointestinal: Negative.   Musculoskeletal: Positive for back pain and myalgias. Negative for arthralgias, gait problem, joint swelling, neck pain and neck stiffness.  Skin: Negative.   Neurological: Negative.   Psychiatric/Behavioral: Negative.     Per HPI unless specifically indicated above     Objective:    BP 130/62   Pulse (!) 128   Ht 5\' 1"  (1.549 m)   BMI 21.73 kg/m   Wt Readings from Last 3 Encounters:  03/04/19 115 lb (52.2 kg)  01/15/19 118 lb (53.5 kg)  12/27/18 115 lb (52.2 kg)    Physical Exam Vitals signs and nursing note reviewed.  Constitutional:      General: She is not in acute distress.    Appearance: Normal appearance. She is not ill-appearing, toxic-appearing or diaphoretic.  HENT:     Head: Normocephalic and atraumatic.     Right Ear: External ear normal.     Left Ear: External ear  normal.     Nose: Nose normal.     Mouth/Throat:     Mouth: Mucous membranes are moist.     Pharynx: Oropharynx is clear.  Eyes:     General: No scleral icterus.       Right eye: No discharge.        Left eye: No discharge.     Conjunctiva/sclera: Conjunctivae normal.     Pupils: Pupils are equal, round, and reactive to light.  Neck:     Musculoskeletal: Normal range of motion.  Pulmonary:     Effort: Pulmonary effort is normal. No respiratory distress.     Comments: Speaking in full sentences Musculoskeletal: Normal range of motion.  Skin:    Coloration: Skin is not jaundiced or pale.     Findings: No bruising, erythema, lesion or rash.  Neurological:     Mental Status: She is alert and oriented to person, place, and time. Mental status is at baseline.  Psychiatric:        Mood and Affect: Mood normal.        Behavior: Behavior normal.        Thought Content: Thought content normal.        Judgment: Judgment normal.     Results for orders placed or performed in visit on 02/25/19  Novel Coronavirus,  NAA ([facility])  Result Value Ref Range   SARS-CoV-2, NAA Not Detected Not Detected      Assessment & Plan:   Problem List Items Addressed This Visit      Respiratory   COPD (chronic obstructive pulmonary disease) ([place]) - Primary    Did not take her ceftin. Back to normal. Refills of singulair and spiriva given today. Call with any concerns. Continue to monitor. Recheck September.      Relevant Medications   montelukast (SINGULAIR) 10 MG tablet   tiotropium (SPIRIVA HANDIHALER) 18 MCG inhalation capsule     Other   Anxiety    A lot of stress with her daughter. Will continue current regimen. Support given. Call with any concerns.           Follow up plan: Return September, for 6 month follow up.    . This visit was completed via [employer] due to the restrictions of the COVID-19 pandemic. All issues as above were discussed and addressed. Physical exam was done as  above through visual confirmation on Doximity. If it was felt that the patient should be evaluated in the office, they were directed there. The patient verbally consented to this visit. . Location of the patient: home . Location of the provider: work . Those involved with this call:  . Provider: Park Liter, DO . CMA: Lesle Chris, [place] . Front Desk/Registration: Don Perking  . Time spent on call: 15 minutes with patient face to face via video conference. More than 50% of this time was spent in counseling and coordination of care. 23 minutes total spent in review of patient's record and preparation of their chart.

## 2019-03-12 NOTE — Assessment & Plan Note (Signed)
Did not take her ceftin. Back to normal. Refills of singulair and spiriva given today. Call with any concerns. Continue to monitor. Recheck September.

## 2019-03-12 NOTE — Assessment & Plan Note (Signed)
A lot of stress with her daughter. Will continue current regimen. Support given. Call with any concerns.

## 2019-03-14 ENCOUNTER — Other Ambulatory Visit: Payer: Self-pay | Admitting: Family Medicine

## 2019-03-18 IMAGING — CR DG CHEST 2V
1 series · 2 of 2 positions shown · non-contrast
Comparison: 10/07/2017.  08/25/2017.  09/10/2012.

CLINICAL DATA: COPD.  Hypertension.

EXAM:
CHEST - 2 VIEW

[Series 1: dg chest 2 view · 0.14mm/px · 2 of 2 slices shown]
[im 1/2]
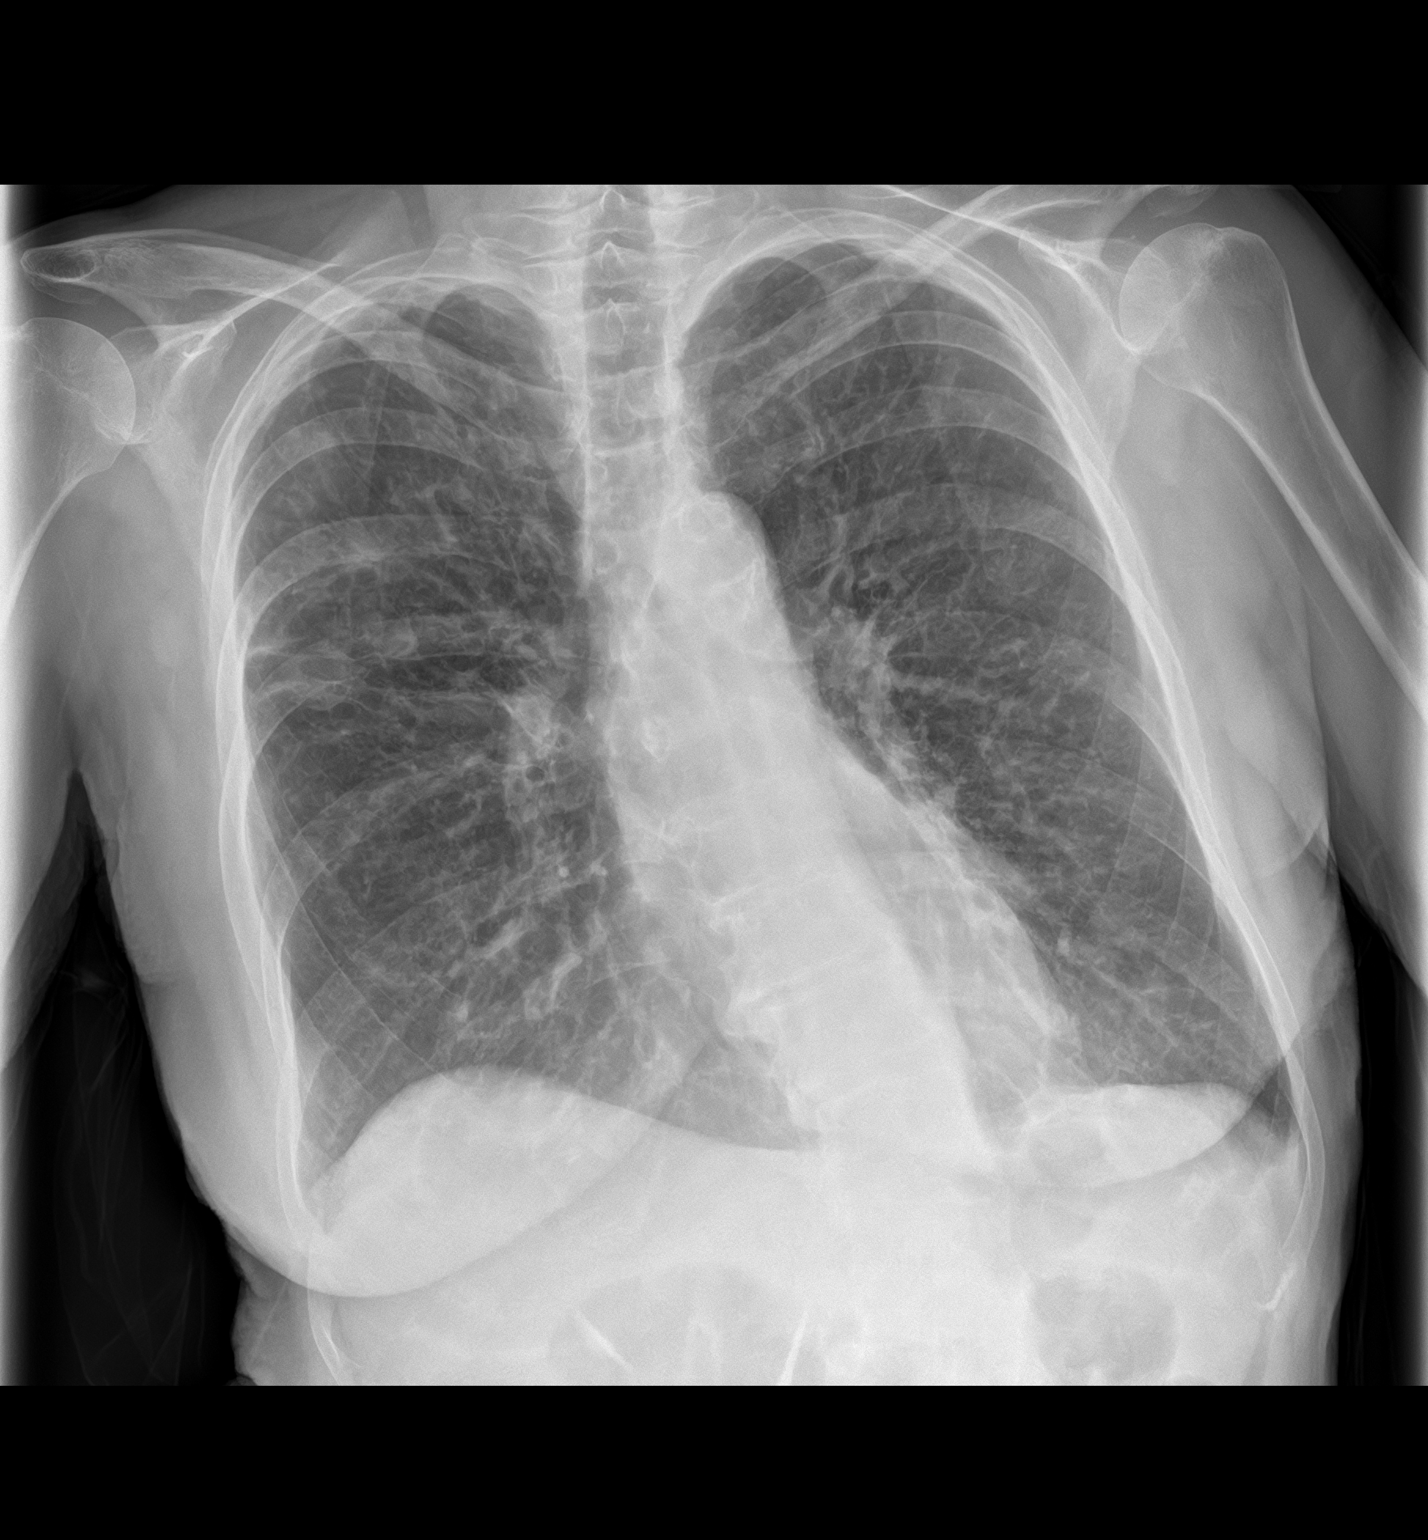
[im 2/2]
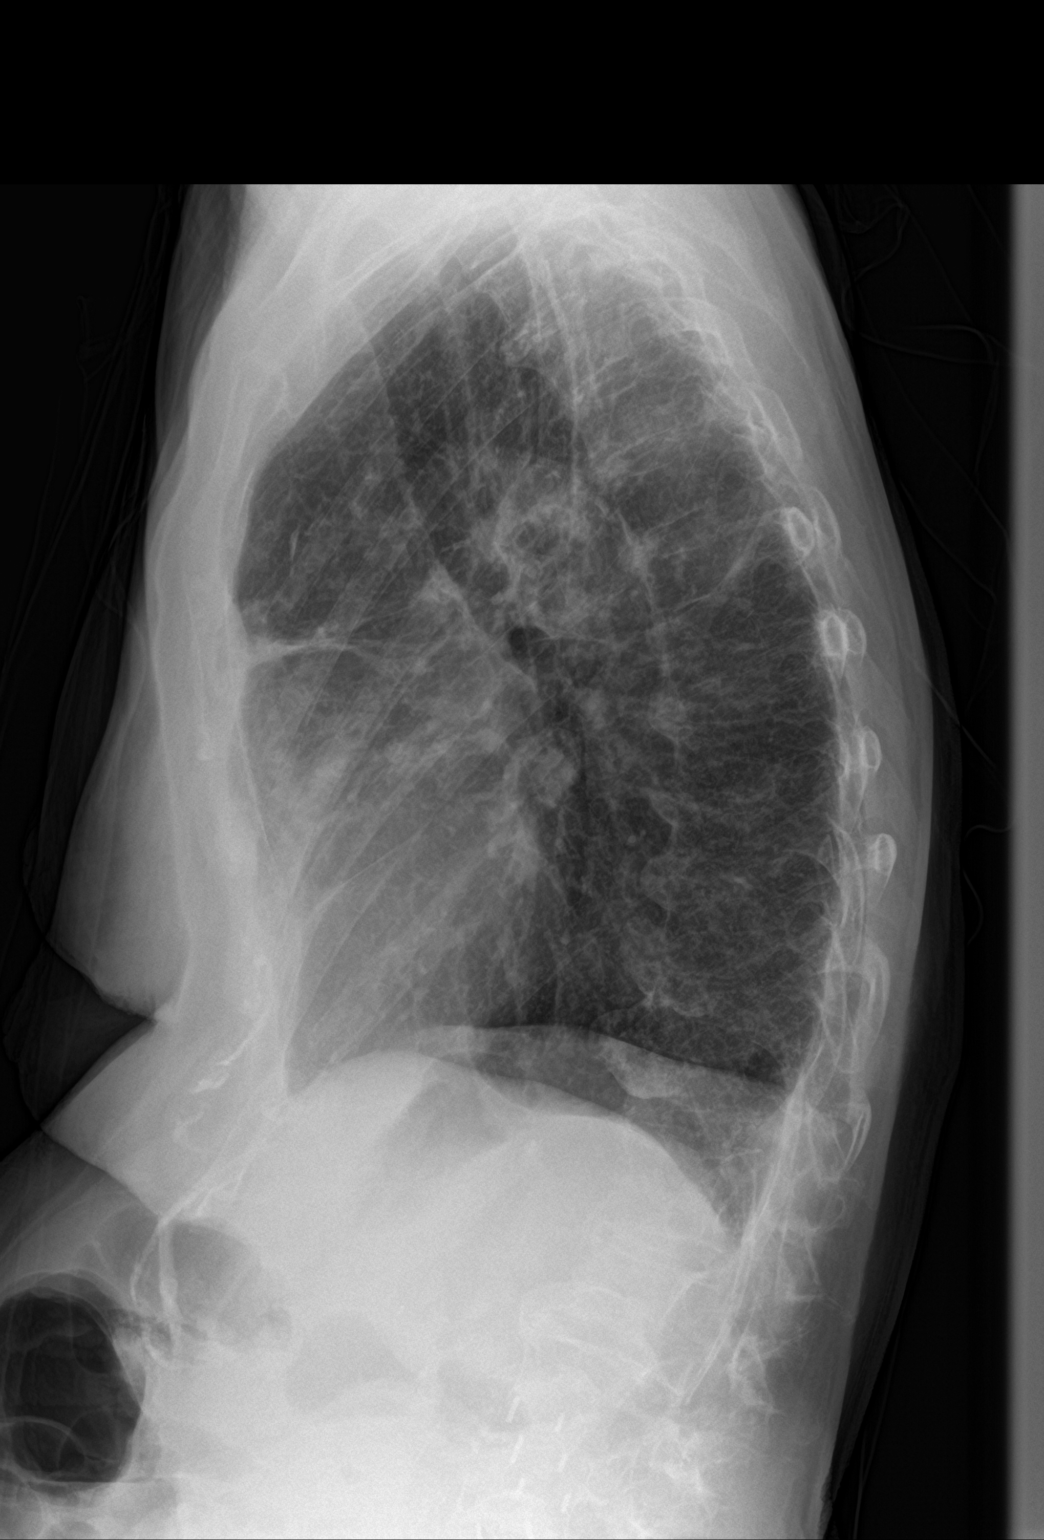

[2 of 2 positions shown; findings below may reference images not displayed]

FINDINGS: Mediastinum and hilar structures normal. Stable right upper lobe
nodular opacities and pleural thickening. Chronic interstitial
changes present bilaterally. Heart size normal. Thoracic spine
scoliosis. Surgical clips upper abdomen.
IMPRESSION: Stable right upper lobe nodular opacities and pleural thickening. No
acute infiltrate.

## 2019-04-04 DIAGNOSIS — H2512 Age-related nuclear cataract, left eye: Secondary | ICD-10-CM | POA: Diagnosis not present

## 2019-04-10 ENCOUNTER — Telehealth: Payer: Self-pay | Admitting: Student in an Organized Health Care Education/Training Program

## 2019-04-10 ENCOUNTER — Encounter: Payer: Self-pay | Admitting: Student in an Organized Health Care Education/Training Program

## 2019-04-10 MED ORDER — GABAPENTIN 300 MG PO CAPS: 300.0000 mg | ORAL_CAPSULE | Freq: Two times a day (BID) | ORAL | 2 refills | Status: DC

## 2019-04-10 NOTE — Telephone Encounter (Signed)
Patient notified

## 2019-04-10 NOTE — Telephone Encounter (Signed)
Needs to get [facility].  Gabapentin.  Oxycodone- she has started breaking out in hives and itches severely when she takes. She only takes when she needs for pain.  Please call patient

## 2019-04-10 NOTE — Telephone Encounter (Signed)
She has appt on 04-15-19. She needs Gabapentin before then. Will you send it to [facility] (3 month supply)? Note that this pharmacy is on her profile, but not her primary pharmacy.

## 2019-04-15 ENCOUNTER — Other Ambulatory Visit: Payer: Self-pay

## 2019-04-15 ENCOUNTER — Encounter: Payer: Self-pay | Admitting: Student in an Organized Health Care Education/Training Program

## 2019-04-15 ENCOUNTER — Ambulatory Visit
Payer: Medicare Other | Attending: Student in an Organized Health Care Education/Training Program | Admitting: Student in an Organized Health Care Education/Training Program

## 2019-04-15 DIAGNOSIS — G894 Chronic pain syndrome: Secondary | ICD-10-CM

## 2019-04-15 DIAGNOSIS — M5136 Other intervertebral disc degeneration, lumbar region: Secondary | ICD-10-CM | POA: Diagnosis not present

## 2019-04-15 DIAGNOSIS — M47816 Spondylosis without myelopathy or radiculopathy, lumbar region: Secondary | ICD-10-CM | POA: Diagnosis not present

## 2019-04-15 DIAGNOSIS — F1190 Opioid use, unspecified, uncomplicated: Secondary | ICD-10-CM

## 2019-04-15 DIAGNOSIS — M4120 Other idiopathic scoliosis, site unspecified: Secondary | ICD-10-CM

## 2019-04-15 DIAGNOSIS — M47814 Spondylosis without myelopathy or radiculopathy, thoracic region: Secondary | ICD-10-CM

## 2019-04-15 MED ORDER — TRAMADOL HCL 50 MG PO TABS: 50.0000 mg | ORAL_TABLET | Freq: Two times a day (BID) | ORAL | 0 refills | Status: AC | PRN

## 2019-04-15 MED ORDER — TRAMADOL HCL 50 MG PO TABS: 50.0000 mg | ORAL_TABLET | Freq: Two times a day (BID) | ORAL | 0 refills | Status: DC | PRN

## 2019-04-15 NOTE — Progress Notes (Signed)
Pain Management Virtual Encounter Note - Virtual Visit via [place] (real-time audio visits between healthcare provider and patient).   Patient's Phone No. & Preferred Pharmacy:  [phone] (home); [phone] (mobile); (Preferred) [phone] [email or web address]  [facility] - [address], [place] [address] Phone: [phone] Fax: [place], [place] [address] Phone: [phone] Fax: [place], [place] [address] [place] [address] Phone: [phone] Fax: [phone]    Pre-screening note:  Our staff contacted Savannah Mcclure and offered her an "in person", "face-to-face" appointment versus a telephone encounter. She indicated preferring the telephone encounter, at this time.   Reason for Virtual Visit: COVID-19*  Social distancing based on CDC and AMA recommendations.   I contacted [place] on 04/15/2019 via video conference.      I clearly identified myself as Gillis Santa, MD. I verified that I was speaking with the correct person using two identifiers (Name: Savannah Mcclure, and date of birth: [birth year]).  Advanced Informed Consent I sought verbal advanced consent from [place] for virtual visit interactions. I informed Savannah Mcclure of possible security and privacy concerns, risks, and limitations associated with providing "not-in-person" medical evaluation and management services. I also informed Savannah Mcclure of the availability of "in-person" appointments. Finally, I informed her that there would be a charge for the virtual visit and that she could be  personally, fully or partially, financially responsible for it. Savannah Mcclure expressed understanding and agreed to  proceed.   Historic Elements   Savannah Mcclure is a 78 y.o. year old, female patient evaluated today after her last encounter by our practice on 04/10/2019. Savannah Mcclure  has a past medical history of Aneurysm ([place]), Anxiety, Blind right eye, Cardiac arrest ([place]), Cataract, Claudication ([place]), COPD (chronic obstructive pulmonary disease) ([place]), Dyspnea, Heart murmur, Hypertension, Leaky heart valve, Oxygen deficit, Peripheral vascular disease ([place]), Scoliosis, Sepsis ([place]), Spinal stenosis, Stroke ([place]), and Tachycardia. She also  has a past surgical history that includes right eye surgery; Wisdom tooth extraction; Back surgery; Abdominal hysterectomy; Breast lumpectomy (Left); and Lower Extremity Angiography (Right, 06/20/2018). Savannah Mcclure has a current medication list which includes the following prescription(s): b complex-c, benazepril, cilostazol, cyclobenzaprine, hydroxyzine, ipratropium-albuterol, lidocaine, magnesium chloride, montelukast, multivitamin with minerals, niacinamide, omeprazole, potassium citrate, spiriva handihaler, cefuroxime, gabapentin, tramadol, UNABLE TO FIND, UNABLE TO FIND, and UNABLE TO FIND. She  reports that she has been smoking cigarettes. She has a 15.00 pack-year smoking history. She has never used smokeless tobacco. She reports current alcohol use. She reports that she does not use drugs. Savannah Mcclure is allergic to other; amlodipine; augmentin [amoxicillin-pot clavulanate]; azithromycin; contrast media [iodinated diagnostic agents]; doxycycline; erythromycin; hydrocodone; lactose intolerance (gi); minocycline; nsaids; relafen [nabumetone]; sulfa antibiotics; and tetracyclines & related.   HPI  Today, she is being contacted for both, medication management and a post-procedure assessment.  Evaluation of last interventional procedure  04/10/2019 Procedure:  Type: Therapeutic Thoracic Medial Nerve Radiofrequency Ablation  #2 (#1 Done  04/2018) Region: Posterior   Level: T11-12, T12-L1 and  L1-L2 Medial Branch Level(s) Laterality: Bilateral Paraspinal Pre-procedure pain score:  4/10 Post-procedure pain score: 0/10         Influential Factors: Intra-procedural challenges: None observed.  Reported side-effects: None.        Post-procedural adverse reactions or complications: None reported         Sedation: Please see nurses note for DOS. When no sedatives are used, the analgesic levels obtained are directly associated to the effectiveness of the local anesthetics. However, when sedation is provided, the level of analgesia obtained during the initial 1 hour following the intervention, is believed to be the result of a combination of factors. These factors may include, but are not limited to: 1. The effectiveness of the local anesthetics used. 2. The effects of the analgesic(s) and/or anxiolytic(s) used. 3. The degree of discomfort experienced by the patient at the time of the procedure. 4. The patients ability and reliability in recalling and recording the events. 5. The presence and influence of possible secondary gains and/or psychosocial factors. Reported result: Relief experienced during the 1st hour after the procedure:100%   (Ultra-Short Term Relief)            Interpretative annotation: Clinically appropriate result. Analgesia during this period is likely to be Local Anesthetic and/or IV Sedative (Analgesic/Anxiolytic) related.          Effects of local anesthetic: The analgesic effects attained during this period are directly associated to the localized infiltration of local anesthetics and therefore cary significant diagnostic value as to the etiological location, or anatomical origin, of the pain. Expected duration of relief is directly dependent on the pharmacodynamics of the local anesthetic used. Long-acting (4-6 hours) anesthetics used.  Reported result: Relief during the next 4 to 6 hour after the procedure:  100%   (Short-Term Relief)            Interpretative annotation: Clinically appropriate result. Analgesia during this period is likely to be Local Anesthetic-related.          Long-term benefit: Defined as the period of time past the expected duration of local anesthetics (1 hour for short-acting and 4-6 hours for long-acting). With the possible exception of prolonged sympathetic blockade from the local anesthetics, benefits during this period are typically attributed to, or associated with, other factors such as analgesic sensory neuropraxia, antiinflammatory effects, or beneficial biochemical changes provided by agents other than the local anesthetics.  Reported result: Extended relief following procedure:75-80%   (Long-Term Relief)            Interpretative annotation: Clinically appropriate result. Good relief. Therapeutic success.    Benefit could signal adequate RF ablation.  Pharmacotherapy Assessment  Analgesic:  12/07/2018  1   12/04/2018  Oxycodone Hcl 5 MG Tablet  120.00 30 Un Pha   [id number]   Sou ([id number])   0  30.00 MME  Comm Ins   [place]     Monitoring: Pharmacotherapy: Patient is experiencing pruritus and hives.  Has multiple allergies.  Has tried hydrocodone in the past which was not effective.  Resulted in nausea as well.  Plan for tramadol. [place] PMP: PDMP not reviewed this encounter.       Compliance: No problems identified. Effectiveness: Clinically acceptable. Plan: Refer to "POC".  UDS:  Summary  Date Value Ref Range Status  01/23/2018 FINAL  Final    Comment:    ==================================================================== TOXASSURE SELECT 13 (MW) ==================================================================== Test                             Result       Flag       Units Drug Absent but  Declared for Prescription Verification   Oxycodone                      Not Detected UNEXPECTED ng/mg  creat ==================================================================== Test                      Result    Flag   Units      Ref Range   Creatinine              163              mg/dL      >=20 ==================================================================== Declared Medications:  The flagging and interpretation on this report are based on the  following declared medications.  Unexpected results may arise from  inaccuracies in the declared medications.  **Note: The testing scope of this panel includes these medications:  Oxycodone  **Note: The testing scope of this panel does not include following  reported medications:  Albuterol (Ipratropium-Albuterol)  Benazepril  Cilostazol  Diltiazem  Gabapentin  Hydroxyzine  Ipratropium (Ipratropium-Albuterol)  Lidocaine  Loperamide  Multivitamin  Supplement  Tiotropium  Vitamin B ==================================================================== For clinical consultation, please call [phone]. ====================================================================    Laboratory Chemistry Profile (12 mo)  Renal: 10/15/2018: BUN 12; BUN/Creatinine Ratio 15; Creatinine, Ser 0.79  Lab Results  Component Value Date   GFRAA 83 10/15/2018   GFRNONAA 72 10/15/2018   Hepatic: 09/06/2018: Albumin 5.0 Lab Results  Component Value Date   AST 20 09/06/2018   ALT 16 09/06/2018   Other: No results found for requested labs within last 8760 hours. Note: Above Lab results reviewed.   Assessment  The primary encounter diagnosis was Thoracic spondylosis without myelopathy. Diagnoses of Spondylosis without myelopathy or radiculopathy, lumbar region, Lumbar degenerative disc disease, Scoliosis (and kyphoscoliosis), idiopathic, Chronic, continuous use of opioids, and Chronic pain syndrome were also pertinent to this visit.  Plan of Care  I have discontinued [place] "Anne"'s oxyCODONE. I am also having her maintain her  B-COMPLEX-C PO, multivitamin with minerals, UNABLE TO FIND, UNABLE TO FIND, UNABLE TO FIND, ipratropium-albuterol, MAGNESIUM CHLORIDE PO, POTASSIUM CITRATE PO, NIACINAMIDE PO, cilostazol, lidocaine, benazepril, cyclobenzaprine, hydrOXYzine, cefUROXime, montelukast, Spiriva HandiHaler, omeprazole, and traMADol.  Patient states that she is experiencing moderate to significant pain relief in her mid thoracic region after thoracic RFA.  Can repeat in the future.  In regards to medication management, patient has been experiencing pruritus and hives with oxycodone.  She has allergies to hydrocodone.  We discussed tramadol.  Patient would like to try tramadol to see if it provides pain relief without the side effects of hives and pruritus that she was experiencing with oxycodone.  Pharmacotherapy (Medications Ordered): Meds ordered this encounter  Medications  . DISCONTD: traMADol (ULTRAM) 50 MG tablet    Sig: Take 1-2 tablets (50-100 mg total) by mouth every 12 (twelve) hours as needed for severe pain.    Dispense:  120 tablet    Refill:  0    Fill one day early if pharmacy is closed on scheduled refill date. Do not fill until:  To last until:  . traMADol (ULTRAM) 50 MG tablet    Sig: Take 1-2 tablets (50-100 mg total) by mouth every 12 (twelve) hours as needed for severe pain.    Dispense:  120 tablet    Refill:  0    Fill one day early if pharmacy is closed on scheduled refill date.   Orders:  Orders Placed This  Encounter  Procedures  . ToxASSURE Select 13 (MW), Urine    Volume: 30 ml(s). Minimum 3 ml of urine is needed. Document temperature of fresh sample. Indications: Long term (current) use of opiate analgesic [id number])   Follow-up plan:   Return if symptoms worsen or fail to improve.     Status post T11, T12, L1 RFA bilaterally on 03/04/2019, 04/2018   Recent Visits Date Type Provider Dept  03/04/19 Procedure visit Gillis Santa, MD [facility]  Showing recent visits within  past 90 days and meeting all other requirements   Today's Visits Date Type Provider Dept  04/15/19 Office Visit Gillis Santa, MD [facility]  Showing today's visits and meeting all other requirements   Future Appointments No visits were found meeting these conditions.  Showing future appointments within next 90 days and meeting all other requirements   I discussed the assessment and treatment plan with the patient. The patient was provided an opportunity to ask questions and all were answered. The patient agreed with the plan and demonstrated an understanding of the instructions.  Patient advised to call back or seek an in-person evaluation if the symptoms or condition worsens.  Total duration of non-face-to-face encounter: 25 minutes.  Note by: Gillis Santa, MD Date: 04/15/2019; Time: 2:41 PM  Note: This dictation was prepared with Dragon dictation. Any transcriptional errors that may result from this process are unintentional.  Disclaimer:  * Given the special circumstances of the COVID-19 pandemic, the federal government has announced that the Office for Civil Rights (OCR) will exercise its enforcement discretion and will not impose penalties on physicians using telehealth in the event of noncompliance with regulatory requirements under the [place] (HIPAA) in connection with the good faith provision of telehealth during the XX123456 national public health emergency. ([place])

## 2019-04-16 DIAGNOSIS — R000 Tachycardia, unspecified: Secondary | ICD-10-CM | POA: Diagnosis not present

## 2019-04-16 DIAGNOSIS — H2512 Age-related nuclear cataract, left eye: Secondary | ICD-10-CM | POA: Diagnosis not present

## 2019-04-17 ENCOUNTER — Other Ambulatory Visit: Payer: Self-pay | Admitting: Student in an Organized Health Care Education/Training Program

## 2019-04-24 ENCOUNTER — Telehealth: Payer: Self-pay | Admitting: Student in an Organized Health Care Education/Training Program

## 2019-04-24 DIAGNOSIS — G894 Chronic pain syndrome: Secondary | ICD-10-CM | POA: Diagnosis not present

## 2019-04-24 NOTE — Telephone Encounter (Signed)
Patient is here to give UDS and get script for tramadol. Please check if this is ready

## 2019-04-24 NOTE — Telephone Encounter (Signed)
This has been taken care of per Stephan Minister RN

## 2019-04-25 ENCOUNTER — Other Ambulatory Visit: Payer: Self-pay

## 2019-04-25 ENCOUNTER — Ambulatory Visit (INDEPENDENT_AMBULATORY_CARE_PROVIDER_SITE_OTHER): Payer: Medicare Other | Admitting: Family Medicine

## 2019-04-25 ENCOUNTER — Encounter: Payer: Self-pay | Admitting: *Deleted

## 2019-04-25 ENCOUNTER — Encounter: Payer: Self-pay | Admitting: Family Medicine

## 2019-04-25 ENCOUNTER — Ambulatory Visit: Payer: Medicare Other | Admitting: Anesthesiology

## 2019-04-25 VITALS — BP 108/75 | HR 125

## 2019-04-25 DIAGNOSIS — J410 Simple chronic bronchitis: Secondary | ICD-10-CM | POA: Diagnosis not present

## 2019-04-25 DIAGNOSIS — I10 Essential (primary) hypertension: Secondary | ICD-10-CM | POA: Diagnosis not present

## 2019-04-25 DIAGNOSIS — F419 Anxiety disorder, unspecified: Secondary | ICD-10-CM | POA: Diagnosis not present

## 2019-04-25 DIAGNOSIS — I6523 Occlusion and stenosis of bilateral carotid arteries: Secondary | ICD-10-CM

## 2019-04-25 DIAGNOSIS — D649 Anemia, unspecified: Secondary | ICD-10-CM | POA: Diagnosis not present

## 2019-04-25 DIAGNOSIS — Z13220 Encounter for screening for lipoid disorders: Secondary | ICD-10-CM

## 2019-04-25 DIAGNOSIS — Z23 Encounter for immunization: Secondary | ICD-10-CM

## 2019-04-25 MED ORDER — BENAZEPRIL HCL 10 MG PO TABS: 10.0000 mg | ORAL_TABLET | Freq: Every day | ORAL | 1 refills | Status: DC

## 2019-04-25 MED ORDER — CILOSTAZOL 100 MG PO TABS: 100.0000 mg | ORAL_TABLET | Freq: Two times a day (BID) | ORAL | 4 refills | Status: DC

## 2019-04-25 NOTE — Assessment & Plan Note (Signed)
Under good control on current regimen. Continue current regimen. Continue to monitor. Call with any concerns. Refills given. Labs drawn today.   

## 2019-04-25 NOTE — Assessment & Plan Note (Signed)
In exacerbation due to social stressors. Continue PRN hydroxyzine. Call with any concerns.

## 2019-04-25 NOTE — Anesthesia Preprocedure Evaluation (Deleted)
Anesthesia Evaluation  Patient identified by MRN, date of birth, ID band Patient awake    Reviewed: Allergy & Precautions, H&P , NPO status , Patient's Chart, lab work & pertinent test results, reviewed documented beta blocker date and time   History of Anesthesia Complications (+) history of anesthetic complications (patient states she "crashed" during spine surgery)  Airway Mallampati: II  TM Distance: >3 FB Neck ROM: full    Dental no notable dental hx.    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 On appropriate oral medications.  COPD (chronic obstructive pulmonary disease) (HCC) Continue pulmonary medications and aerosols as already ordered, these medications have been reviewed and there are no changes at this time.  Carotid stenosis Her right carotid stenosis was in the 40 to 59% range in her left carotid stenosis was in the 60 to 79% rangewhen checked recently. At this point, medical management would still be continued and recommended. We will recheck this in 6 months with duplex.  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  She only has claudication symptoms so no intervention has been performed in her case.  Her ABIs today are actually better at 0.75 on the right and 0.72 on the left with reasonable monophasic waveforms distally. At this time, she is doing reasonably well and I would not recommend any intervention.  Plan to recheck in 6 months with noninvasive studies.  No current limb threatening or life-threatening symptoms.  Leotis Pain, MD  Reproductive/Obstetrics negative OB ROS                            Anesthesia Physical Anesthesia Plan  ASA: III  Anesthesia Plan: MAC   Post-op Pain Management:    Induction:   PONV Risk Score and Plan:   Airway Management Planned:   Additional Equipment:   Intra-op Plan:   Post-operative Plan:   Informed Consent: I have reviewed the patients History and Physical, chart, labs and discussed the procedure including the risks, benefits and alternatives for the proposed anesthesia with the patient or authorized representative who has indicated his/her understanding and acceptance.     Dental Advisory Given  Plan Discussed with: CRNA  Anesthesia Plan Comments:         Anesthesia Quick Evaluation

## 2019-04-25 NOTE — Assessment & Plan Note (Signed)
Rechecking levels today. Call with any concerns.  

## 2019-04-25 NOTE — Progress Notes (Signed)
BP 108/75   Pulse (!) 125   SpO2 97%    Subjective:    Patient ID: Savannah Mcclure, female    DOB: 09/15/1940, 78 y.o.   MRN: [id number]  HPI: Savannah Mcclure is a 78 y.o. female  Chief Complaint  Patient presents with  . Hypertension  . Anxiety   HYPERTENSION Hypertension status: controlled  Satisfied with current treatment? yes Duration of hypertension: chronic BP monitoring frequency:  not checking BP medication side effects:  no Medication compliance: excellent compliance Previous BP meds: benazepril Aspirin: no Recurrent headaches: no Visual changes: no Palpitations: no Dyspnea: no Chest pain: no Lower extremity edema: no Dizzy/lightheaded: no  ANXIETY/STRESS- very concerned about her daughter. Concerned about her daughter being abused. Her step-son is sending up a girl who is being abused to her and is also going to moving up Duration:exacerbated Anxious mood: yes  Excessive worrying: yes Irritability: yes  Sweating: no Nausea: no Palpitations:no Hyperventilation: no Panic attacks: no Agoraphobia: no  Obscessions/compulsions: no Depressed mood: yes Depression screen [facility] 2/9 02/20/2019 11/22/2018 09/06/2018 06/19/2018 05/07/2018  Decreased Interest 0 0 0 0 0  Down, Depressed, Hopeless 0 0 0 0 0  PHQ - 2 Score 0 0 0 0 0  Altered sleeping - 2 1 - -  Tired, decreased energy - 1 1 - -  Change in appetite - 1 0 - -  Feeling bad or failure about yourself  - 0 0 - -  Trouble concentrating - 0 0 - -  Moving slowly or fidgety/restless - 0 0 - -  Suicidal thoughts - 0 0 - -  PHQ-9 Score - 4 2 - -  Difficult doing work/chores - - Not difficult at all - -  Some recent data might be hidden   GAD 7 : Generalized Anxiety Score 11/22/2018 09/06/2018 01/30/2018 01/04/2018  Nervous, Anxious, on Edge 2 2 0 1  Control/stop worrying 2 3 0 2  Worry too much - different things 2 0 1 3  Trouble relaxing 1 1 0 2  Restless 0 0 0 1  Easily annoyed or irritable 1 1  1 2   Afraid - awful might happen 0 0 0 0  Total GAD 7 Score 8 7 2 11   Anxiety Difficulty - Not difficult at all Not difficult at all Somewhat difficult   Anhedonia: no Weight changes: no Insomnia: no   Hypersomnia: no Fatigue/loss of energy: yes Feelings of worthlessness: no Feelings of guilt: no Impaired concentration/indecisiveness: no Suicidal ideations: no  Crying spells: yes Recent Stressors/Life Changes: yes   Relationship problems: yes   Family stress: yes     Financial stress: yes    Job stress: no    Recent death/loss: no  ANEMIA Anemia status: stable Compliance with treatment: excellent compliance Iron supplementation side effects: no Severity of anemia: mild Fatigue: yes Decreased exercise tolerance: no  Dyspnea on exertion: no Palpitations: no Bleeding: no Pica: no  Relevant past medical, surgical, family and social history reviewed and updated as indicated. Interim medical history since our last visit reviewed. Allergies and medications reviewed and updated.  Review of Systems  Constitutional: Negative.   Eyes: Positive for visual disturbance. Negative for photophobia, pain, discharge, redness and itching.  Respiratory: Negative.   Cardiovascular: Negative.   Neurological: Negative.   Psychiatric/Behavioral: Negative.     Per HPI unless specifically indicated above     Objective:    BP 108/75   Pulse (!) 125   SpO2 97%  Wt Readings from Last 3 Encounters:  03/04/19 115 lb (52.2 kg)  01/15/19 118 lb (53.5 kg)  12/27/18 115 lb (52.2 kg)    Physical Exam Vitals signs and nursing note reviewed.  Constitutional:      General: She is not in acute distress.    Appearance: Normal appearance. She is not ill-appearing, toxic-appearing or diaphoretic.  HENT:     Head: Normocephalic and atraumatic.     Right Ear: External ear normal.     Left Ear: External ear normal.     Nose: Nose normal.     Mouth/Throat:     Mouth: Mucous membranes are  moist.     Pharynx: Oropharynx is clear.  Eyes:     General: No scleral icterus.       Right eye: No discharge.        Left eye: No discharge.     Extraocular Movements: Extraocular movements intact.     Pupils: Pupils are equal, round, and reactive to light.     Comments: Cataract R eye  Neck:     Musculoskeletal: Normal range of motion and neck supple.  Cardiovascular:     Rate and Rhythm: Normal rate and regular rhythm.     Pulses: Normal pulses.     Heart sounds: Normal heart sounds. No murmur. No friction rub. No gallop.   Pulmonary:     Effort: Pulmonary effort is normal. No respiratory distress.     Breath sounds: Normal breath sounds. No stridor. No wheezing, rhonchi or rales.  Chest:     Chest wall: No tenderness.  Musculoskeletal: Normal range of motion.  Skin:    General: Skin is warm and dry.     Capillary Refill: Capillary refill takes less than 2 seconds.     Coloration: Skin is not jaundiced or pale.     Findings: No bruising, erythema, lesion or rash.  Neurological:     General: No focal deficit present.     Mental Status: She is alert and oriented to person, place, and time. Mental status is at baseline.  Psychiatric:        Mood and Affect: Mood normal.        Behavior: Behavior normal.        Thought Content: Thought content normal.        Judgment: Judgment normal.     Results for orders placed or performed in visit on 02/25/19  Novel Coronavirus, NAA ([facility])  Result Value Ref Range   SARS-CoV-2, NAA Not Detected Not Detected      Assessment & Plan:   Problem List Items Addressed This Visit      Cardiovascular and Mediastinum   HTN (hypertension) - Primary    Under good control on current regimen. Continue current regimen. Continue to monitor. Call with any concerns. Refills given. Labs drawn today.        Relevant Medications   benazepril (LOTENSIN) 10 MG tablet   Other Relevant Orders   Comprehensive metabolic panel     Respiratory    COPD (chronic obstructive pulmonary disease) ([place])    Under good control on current regimen. Continue current regimen. Continue to monitor. Call with any concerns. Refills given. Labs drawn today.         Other   Anxiety    In exacerbation due to social stressors. Continue PRN hydroxyzine. Call with any concerns.       Anemia    Rechecking levels today. Call with any concerns.  Relevant Orders   CBC with Differential/Platelet    Other Visit Diagnoses    Screening for cholesterol level       Labs drawn today.    Relevant Orders   Lipid Panel w/o Chol/HDL Ratio   Immunization due       Flu shot given today.    Relevant Orders   Flu Vaccine QUAD High Dose(Fluad) (Completed)       Follow up plan: Return in about 6 months (around 10/23/2019) for Wellness/follow up .

## 2019-04-26 ENCOUNTER — Other Ambulatory Visit
Admission: RE | Admit: 2019-04-26 | Discharge: 2019-04-26 | Disposition: A | Discharge status: Home / Self Care | Payer: Medicare Other | Source: Ambulatory Visit | Attending: Ophthalmology | Admitting: Ophthalmology

## 2019-04-26 DIAGNOSIS — Z01812 Encounter for preprocedural laboratory examination: Secondary | ICD-10-CM | POA: Diagnosis not present

## 2019-04-26 DIAGNOSIS — Z20828 Contact with and (suspected) exposure to other viral communicable diseases: Secondary | ICD-10-CM | POA: Diagnosis not present

## 2019-04-26 LAB — CBC WITH DIFFERENTIAL/PLATELET
Basophils Absolute: 0.1 10*3/uL (ref 0.0–0.2)
Basos: 1 %
EOS (ABSOLUTE): 0.2 10*3/uL (ref 0.0–0.4)
Eos: 1 %
Hematocrit: 40.3 % (ref 34.0–46.6)
Hemoglobin: 13.9 g/dL (ref 11.1–15.9)
Immature Grans (Abs): 0 10*3/uL (ref 0.0–0.1)
Immature Granulocytes: 0 %
Lymphocytes Absolute: 2.8 10*3/uL (ref 0.7–3.1)
Lymphs: 22 %
MCH: 31.2 pg (ref 26.6–33.0)
MCHC: 34.5 g/dL (ref 31.5–35.7)
MCV: 91 fL (ref 79–97)
Monocytes Absolute: 0.8 10*3/uL (ref 0.1–0.9)
Monocytes: 7 %
Neutrophils Absolute: 8.9 10*3/uL — ABNORMAL HIGH (ref 1.4–7.0)
Neutrophils: 69 %
Platelets: 474 10*3/uL — ABNORMAL HIGH (ref 150–450)
RBC: 4.45 x10E6/uL (ref 3.77–5.28)
RDW: 13 % (ref 11.7–15.4)
WBC: 12.8 10*3/uL — ABNORMAL HIGH (ref 3.4–10.8)

## 2019-04-26 LAB — LIPID PANEL W/O CHOL/HDL RATIO
Cholesterol, Total: 153 mg/dL (ref 100–199)
HDL: 53 mg/dL (ref >39)
LDL Chol Calc (NIH): 78 mg/dL (ref 0–99)
Triglycerides: 123 mg/dL (ref 0–149)
VLDL Cholesterol Cal: 22 mg/dL (ref 5–40)

## 2019-04-26 LAB — TOXASSURE SELECT 13 (MW), URINE

## 2019-04-26 LAB — COMPREHENSIVE METABOLIC PANEL
ALT: 11 IU/L (ref 0–32)
AST: 15 IU/L (ref 0–40)
Albumin/Globulin Ratio: 2.2 (ref 1.2–2.2)
Albumin: 4.7 g/dL (ref 3.7–4.7)
Alkaline Phosphatase: 80 IU/L (ref 39–117)
BUN/Creatinine Ratio: 12 (ref 12–28)
BUN: 10 mg/dL (ref 8–27)
Bilirubin Total: 0.3 mg/dL (ref 0.0–1.2)
CO2: 26 mmol/L (ref 20–29)
Calcium: 9.7 mg/dL (ref 8.7–10.3)
Chloride: 101 mmol/L (ref 96–106)
Creatinine, Ser: 0.82 mg/dL (ref 0.57–1.00)
GFR calc Af Amer: 79 mL/min/{1.73_m2} (ref >59)
GFR calc non Af Amer: 69 mL/min/{1.73_m2} (ref >59)
Globulin, Total: 2.1 g/dL (ref 1.5–4.5)
Glucose: 82 mg/dL (ref 65–99)
Potassium: 4.6 mmol/L (ref 3.5–5.2)
Sodium: 142 mmol/L (ref 134–144)
Total Protein: 6.8 g/dL (ref 6.0–8.5)

## 2019-04-27 LAB — SARS CORONAVIRUS 2 (TAT 6-24 HRS): SARS Coronavirus 2: NEGATIVE

## 2019-04-29 NOTE — Discharge Instructions (Signed)
General Anesthesia, Adult, Care After °This sheet gives you information about how to care for yourself after your procedure. Your health care provider may also give you more specific instructions. If you have problems or questions, contact your health care provider. °What can I expect after the procedure? °After the procedure, the following side effects are common: °· Pain or discomfort at the IV site. °· Nausea. °· Vomiting. °· Sore throat. °· Trouble concentrating. °· Feeling cold or chills. °· Weak or tired. °· Sleepiness and fatigue. °· Soreness and body aches. These side effects can affect parts of the body that were not involved in surgery. °Follow these instructions at home: ° °For at least 24 hours after the procedure: °· Have a responsible adult stay with you. It is important to have someone help care for you until you are awake and alert. °· Rest as needed. °· Do not: °? Participate in activities in which you could fall or become injured. °? Drive. °? Use heavy machinery. °? Drink alcohol. °? Take sleeping pills or medicines that cause drowsiness. °? Make important decisions or sign legal documents. °? Take care of children on your own. °Eating and drinking °· Follow any instructions from your health care provider about eating or drinking restrictions. °· When you feel hungry, start by eating small amounts of foods that are soft and easy to digest (bland), such as toast. Gradually return to your regular diet. °· Drink enough fluid to keep your urine pale yellow. °· If you vomit, rehydrate by drinking water, juice, or clear broth. °General instructions °· If you have sleep apnea, surgery and certain medicines can increase your risk for breathing problems. Follow instructions from your health care provider about wearing your sleep device: °? Anytime you are sleeping, including during daytime naps. °? While taking prescription pain medicines, sleeping medicines, or medicines that make you drowsy. °· Return to  your normal activities as told by your health care provider. Ask your health care provider what activities are safe for you. °· Take over-the-counter and prescription medicines only as told by your health care provider. °· If you smoke, do not smoke without supervision. °· Keep all follow-up visits as told by your health care provider. This is important. °Contact a health care provider if: °· You have nausea or vomiting that does not get better with medicine. °· You cannot eat or drink without vomiting. °· You have pain that does not get better with medicine. °· You are unable to pass urine. °· You develop a skin rash. °· You have a fever. °· You have redness around your IV site that gets worse. °Get help right away if: °· You have difficulty breathing. °· You have chest pain. °· You have blood in your urine or stool, or you vomit blood. °Summary °· After the procedure, it is common to have a sore throat or nausea. It is also common to feel tired. °· Have a responsible adult stay with you for the first 24 hours after general anesthesia. It is important to have someone help care for you until you are awake and alert. °· When you feel hungry, start by eating small amounts of foods that are soft and easy to digest (bland), such as toast. Gradually return to your regular diet. °· Drink enough fluid to keep your urine pale yellow. °· Return to your normal activities as told by your health care provider. Ask your health care provider what activities are safe for you. °This information is not   intended to replace advice given to you by your health care provider. Make sure you discuss any questions you have with your health care provider. °Document Released: 11/07/2000 Document Revised: 08/04/2017 Document Reviewed: 03/17/2017 °Elsevier Patient Education © 2020 [employer] °Cataract Surgery, Care After °This sheet gives you information about how to care for yourself after your procedure. Your health care provider may also  give you more specific instructions. If you have problems or questions, contact your health care provider. °What can I expect after the procedure? °After the procedure, it is common to have: °· Itching. °· Discomfort. °· Fluid discharge. °· Sensitivity to light and to touch. °· Bruising in or around the eye. °· Mild blurred vision. °Follow these instructions at home: °Eye care ° °· Do not touch or rub your eyes. °· Protect your eyes as told by your health care provider. You may be told to wear a protective eye shield or sunglasses. °· Do not put a contact lens into the affected eye or eyes until your health care provider approves. °· Keep the area around your eye clean and dry: °? Avoid swimming. °? Do not allow water to hit you directly in the face while showering. °? Keep soap and shampoo out of your eyes. °· Check your eye every day for signs of infection. Watch for: °? Redness, swelling, or pain. °? Fluid, blood, or pus. °? Warmth. °? A bad smell. °? Vision that is getting worse. °? Sensitivity that is getting worse. °Activity °· Do not drive for 24 hours if you were given a sedative during your procedure. °· Avoid strenuous activities, such as playing contact sports, for as long as told by your health care provider. °· Do not drive or use heavy machinery until your health care provider approves. °· Do not bend or lift heavy objects. Bending increases pressure in the eye. You can walk, climb stairs, and do light household chores. °· Ask your health care provider when you can return to work. If you work in a dusty environment, you may be advised to wear protective eyewear for a period of time. °General instructions °· Take or apply over-the-counter and prescription medicines only as told by your health care provider. This includes eye drops. °· Keep all follow-up visits as told by your health care provider. This is important. °Contact a health care provider if: °· You have increased bruising around your  eye. °· You have pain that is not helped with medicine. °· You have a fever. °· You have redness, swelling, or pain in your eye. °· You have fluid, blood, or pus coming from your incision. °· Your vision gets worse. °· Your sensitivity to light gets worse. °Get help right away if: °· You have sudden loss of vision. °· You see flashes of light or spots (floaters). °· You have severe eye pain. °· You develop nausea or vomiting. °Summary °· After your procedure, it is common to have itching, discomfort, bruising, fluid discharge, or sensitivity to light. °· Follow instructions from your health care provider about caring for your eye after the procedure. °· Do not rub your eye after the procedure. You may need to wear eye protection or sunglasses. Do not wear contact lenses. Keep the area around your eye clean and dry. °· Avoid activities that require a lot of effort. These include playing sports and lifting heavy objects. °· Contact a health care provider if you have increased bruising, pain that does not go away, or a fever. Get   help right away if you suddenly lose your vision, see flashes of light or spots, or have severe pain in the eye. °This information is not intended to replace advice given to you by your health care provider. Make sure you discuss any questions you have with your health care provider. °Document Released: 02/18/2005 Document Revised: 01/29/2018 Document Reviewed: 01/29/2018 °Elsevier Patient Education © 2020 Elsevier Inc. ° °

## 2019-05-01 ENCOUNTER — Other Ambulatory Visit: Payer: Self-pay

## 2019-05-01 ENCOUNTER — Ambulatory Visit
Admission: RE | Admit: 2019-05-01 | Discharge: 2019-05-01 | Disposition: A | Discharge status: Home / Self Care | Payer: Medicare Other | Attending: Ophthalmology | Admitting: Ophthalmology

## 2019-05-01 ENCOUNTER — Encounter
Admission: RE | Disposition: A | Discharge status: Home / Self Care | Payer: Self-pay | Source: Home / Self Care | Attending: Ophthalmology

## 2019-05-01 DIAGNOSIS — B888 Other specified infestations: Secondary | ICD-10-CM | POA: Insufficient documentation

## 2019-05-01 DIAGNOSIS — Z5309 Procedure and treatment not carried out because of other contraindication: Secondary | ICD-10-CM | POA: Diagnosis not present

## 2019-05-01 DIAGNOSIS — H269 Unspecified cataract: Secondary | ICD-10-CM | POA: Insufficient documentation

## 2019-05-01 HISTORY — DX: Gastro-esophageal reflux disease without esophagitis: K21.9

## 2019-05-01 SURGERY — PHACOEMULSIFICATION, CATARACT, WITH IOL INSERTION
Anesthesia: Monitor Anesthesia Care | Laterality: Left

## 2019-05-01 MED ORDER — TETRACAINE HCL 0.5 % OP SOLN
1.0000 [drp] | OPHTHALMIC | Status: DC | PRN
  Administered 2019-05-01: 09:00:00 1 [drp] via OPHTHALMIC

## 2019-05-01 MED ORDER — ARMC OPHTHALMIC DILATING DROPS
1.0000 "application " | OPHTHALMIC | Status: DC | PRN
  Administered 2019-05-01: 1 via OPHTHALMIC

## 2019-05-01 MED ORDER — MOXIFLOXACIN HCL 0.5 % OP SOLN
1.0000 [drp] | OPHTHALMIC | Status: DC | PRN
  Administered 2019-05-01 (×2): 1 [drp] via OPHTHALMIC

## 2019-05-01 SURGICAL SUPPLY — 23 items
CANNULA ANT/CHMB 27GA (MISCELLANEOUS) ×3 IMPLANT
GLOVE SURG LX 7.5 STRW (GLOVE) ×2
GLOVE SURG LX STRL 7.5 STRW (GLOVE) ×1 IMPLANT
GLOVE SURG TRIUMPH 8.0 PF LTX (GLOVE) ×3 IMPLANT
GOWN STRL REUS W/ TWL LRG LVL3 (GOWN DISPOSABLE) ×2 IMPLANT
GOWN STRL REUS W/TWL LRG LVL3 (GOWN DISPOSABLE) ×4
MARKER SKIN DUAL TIP RULER LAB (MISCELLANEOUS) ×3 IMPLANT
NDL RETROBULBAR .5 NSTRL (NEEDLE) IMPLANT
NEEDLE FILTER BLUNT 18X 1/2SAF (NEEDLE)
NEEDLE FILTER BLUNT 18X1 1/2 (NEEDLE) IMPLANT
PACK CATARACT BRASINGTON (MISCELLANEOUS) ×3 IMPLANT
PACK EYE AFTER SURG (MISCELLANEOUS) ×3 IMPLANT
PACK OPTHALMIC (MISCELLANEOUS) ×3 IMPLANT
RING MALYGIN 7.0 (MISCELLANEOUS) IMPLANT
SUT ETHILON 10-0 CS-B-6CS-B-6 (SUTURE)
SUT VICRYL  9 0 (SUTURE)
SUT VICRYL 9 0 (SUTURE) IMPLANT
SUTURE EHLN 10-0 CS-B-6CS-B-6 (SUTURE) IMPLANT
SYR 3ML LL SCALE MARK (SYRINGE) ×3 IMPLANT
SYR 5ML LL (SYRINGE) IMPLANT
SYR TB 1ML LUER SLIP (SYRINGE) ×3 IMPLANT
WATER STERILE IRR 500ML POUR (IV SOLUTION) ×3 IMPLANT
WIPE NON LINTING 3.25X3.25 (MISCELLANEOUS) ×3 IMPLANT

## 2019-05-01 NOTE — H&P (Addendum)
The History and Physical notes are on paper, have been signed, and are to be scanned.  The patient was found to be infested with "bed bugs" Surgery canceled.  Savannah Mcclure 05/01/2019 8:51 AM

## 2019-05-01 NOTE — Progress Notes (Signed)
Procedure cancelled due to bed bug infestation.

## 2019-05-21 ENCOUNTER — Other Ambulatory Visit: Payer: Self-pay | Admitting: Family Medicine

## 2019-05-21 MED ORDER — CILOSTAZOL 100 MG PO TABS: 100.0000 mg | ORAL_TABLET | Freq: Two times a day (BID) | ORAL | 4 refills | Status: DC

## 2019-05-21 NOTE — Telephone Encounter (Signed)
Medication Refill - Medication:  cilostazol (PLETAL) 100 MG tablet   Has the patient contacted their pharmacy? Yes advised to call office. Pt is almost out and [facility] wont send on time. Asking for 1 month refill at local pharmacy.   Preferred Pharmacy (with phone number or street name):  [place], [place] [phone] (Phone) [phone] (Fax)   Agent: Please be advised that RX refills may take up to 3 business days. We ask that you follow-up with your pharmacy.

## 2019-06-19 ENCOUNTER — Other Ambulatory Visit: Payer: Self-pay | Admitting: Family Medicine

## 2019-07-03 ENCOUNTER — Ambulatory Visit: Admit: 2019-07-03 | Payer: Medicare Other | Admitting: Ophthalmology

## 2019-07-03 SURGERY — PHACOEMULSIFICATION, CATARACT, WITH IOL INSERTION
Anesthesia: Topical | Laterality: Left

## 2019-07-19 ENCOUNTER — Ambulatory Visit (INDEPENDENT_AMBULATORY_CARE_PROVIDER_SITE_OTHER): Payer: Medicare Other

## 2019-07-19 ENCOUNTER — Ambulatory Visit (INDEPENDENT_AMBULATORY_CARE_PROVIDER_SITE_OTHER): Payer: Medicare Other | Admitting: Vascular Surgery

## 2019-07-19 ENCOUNTER — Other Ambulatory Visit: Payer: Self-pay

## 2019-07-19 ENCOUNTER — Encounter (INDEPENDENT_AMBULATORY_CARE_PROVIDER_SITE_OTHER): Payer: Self-pay | Admitting: Vascular Surgery

## 2019-07-19 VITALS — BP 119/75 | HR 125 | Resp 15 | Wt 119.4 lb

## 2019-07-19 DIAGNOSIS — I10 Essential (primary) hypertension: Secondary | ICD-10-CM | POA: Diagnosis not present

## 2019-07-19 DIAGNOSIS — I6523 Occlusion and stenosis of bilateral carotid arteries: Secondary | ICD-10-CM

## 2019-07-19 DIAGNOSIS — I70213 Atherosclerosis of native arteries of extremities with intermittent claudication, bilateral legs: Secondary | ICD-10-CM | POA: Diagnosis not present

## 2019-07-19 DIAGNOSIS — J410 Simple chronic bronchitis: Secondary | ICD-10-CM | POA: Diagnosis not present

## 2019-07-19 NOTE — Assessment & Plan Note (Signed)
Her ABIs today are basically stable at 0.66 on the right and 0.68 on the left with monophasic waveforms.  She has known aortoiliac occlusion and SFA occlusions.  We have opted for conservative therapy because her symptoms have remained fairly stable. Would still continue conservative therapy and plan to see her back in 6 months.

## 2019-07-19 NOTE — Progress Notes (Signed)
MRN : [id number]  Savannah Mcclure is a 78 y.o. ([birth year]) female who presents with chief complaint of  Chief Complaint  Patient presents with  . Follow-up    ultrasound follow up  .  History of Present Illness: Patient returns in follow-up of multiple vascular issues.  She has fairly stable claudication symptoms in both lower extremities.  No new ulceration or infection.  Her ABIs today are basically stable at 0.66 on the right and 0.68 on the left with monophasic waveforms.  She has known aortoiliac occlusion and SFA occlusions.  We have opted for conservative therapy because her symptoms have remained fairly stable. She is also studied for carotid disease today.  She has no focal neurologic symptoms since her last visit. Specifically, the patient denies amaurosis fugax, speech or swallowing difficulties, or arm or leg weakness or numbness. Right carotid stenosis in the 40 to 59% range by duplex and left carotid stenosis in the 60 to 79% range by duplex.  No significant change from her previous studies.  Current Outpatient Medications  Medication Sig Dispense Refill  . B-COMPLEX-C PO Take 1 tablet by mouth daily.    . benazepril (LOTENSIN) 10 MG tablet Take 1 tablet (10 mg total) by mouth daily. 90 tablet 1  . cilostazol (PLETAL) 100 MG tablet Take 1 tablet (100 mg total) by mouth 2 (two) times daily. 180 tablet 4  . cyclobenzaprine (FLEXERIL) 5 MG tablet Take 5 mg by mouth 3 (three) times daily as needed for muscle spasms.    . diphenhydrAMINE (BENADRYL) 25 MG tablet Take 25 mg by mouth every 6 (six) hours as needed.    . DUREZOL 0.05 % EMUL     . gabapentin (NEURONTIN) 300 MG capsule Take 1 capsule (300 mg total) by mouth 2 (two) times daily. 180 capsule 2  . hydrOXYzine (ATARAX/VISTARIL) 25 MG tablet TAKE 1 TABLET THREE TIMES A DAY AS NEEDED 270 tablet 3  . lidocaine (XYLOCAINE) 2 % solution Use as directed 15 mLs in the mouth or throat as needed for mouth pain.    Marland Kitchen  MAGNESIUM CHLORIDE PO Take 1 capsule by mouth daily.    . Misc Natural Products (JOINT HEALTH PO) Take by mouth daily.    . montelukast (SINGULAIR) 10 MG tablet TAKE 1 TABLET AT BEDTIME 90 tablet 3  . Multiple Vitamins-Minerals (MULTIVITAMIN WITH MINERALS) tablet Take 1 tablet by mouth daily.    Marland Kitchen NIACINAMIDE PO Take 1 capsule by mouth daily.    Marland Kitchen omeprazole (PRILOSEC) 20 MG capsule TAKE 1 CAPSULE DAILY 90 capsule 3  . POTASSIUM CITRATE PO Take 1 capsule by mouth daily.    Marland Kitchen tiotropium (SPIRIVA HANDIHALER) 18 MCG inhalation capsule PLACE 1 CAPSULE INTO INHALER AND INHALE THE CONTENTS DAILY 90 capsule 3  . UNABLE TO FIND Take 1 tablet by mouth 2 (two) times daily. Cardio FX    . UNABLE TO FIND Take 1 tablet by mouth daily. Cinnergy    . ipratropium-albuterol (DUONEB) 0.5-2.5 (3) MG/3ML SOLN Take 3 mLs by nebulization every 6 (six) hours as needed. (Patient not taking: Reported on 04/25/2019) 360 mL 6  . lidocaine (LIDODERM) 5 % Place 1 patch onto the skin daily. Remove & Discard patch within 12 hours or as directed by MD (Patient not taking: Reported on 04/25/2019) 60 patch 6   No current facility-administered medications for this visit.     Past Medical History:  Diagnosis Date  . 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  Anxiety   . Blind right eye   . Cardiac arrest ([place])   . Cataract    right eye  . Claudication ([place])   . Complication of anesthesia 03/15/2012   "crashed" during first part of 3-part back surgery.   Marland Kitchen COPD (chronic obstructive pulmonary disease) ([place])   . Dyspnea   . GERD (gastroesophageal reflux disease)   . Heart murmur   . Hypertension   . Leaky heart valve   . Oxygen deficit    uses O2 at night 2 liters  . Peripheral vascular disease ([place])   . Scoliosis   . Sepsis ([place])   . Spinal stenosis   . Stroke [facility])    Eye  . Tachycardia     Past Surgical History:  Procedure Laterality Date  . ABDOMINAL HYSTERECTOMY     total  . BACK SURGERY    . BREAST LUMPECTOMY  Left   . LOWER EXTREMITY ANGIOGRAPHY Right 06/20/2018   Procedure: LOWER EXTREMITY ANGIOGRAPHY;  Surgeon: Algernon Huxley, MD;  Location: [facility];  Service: Cardiovascular;  Laterality: Right;  . 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 WISDOM TOOTH EXTRACTION       Social History   Tobacco Use  . Smoking status: Current Every Day Smoker    Packs/day: 0.50    Years: 30.00    Pack years: 15.00    Types: Cigarettes  . Smokeless tobacco: Never Used  . Tobacco comment: since "mid 62s"  Substance Use Topics  . Alcohol use: Yes    Frequency: Never    Comment: wine on occasion (1x/month)  . Drug use: No    Family History  Problem Relation Age of Onset  . Intracerebral hemorrhage Mother   . Hypertension Mother   . Heart failure Father   . Schizophrenia Daughter   . Rheum arthritis Maternal Grandmother   . Kidney disease Maternal Grandmother   . Heart disease Paternal Grandmother   . Stroke Paternal Grandmother   . Stroke Paternal Grandfather   . Heart disease Paternal Grandfather      Allergies  Allergen Reactions  . Other Itching    Cat Gut sutures   . Oxycodone Hives  . Amlodipine Hives  . Augmentin [Amoxicillin-Pot Clavulanate] Diarrhea  . Azithromycin Hives  . Contrast Media [Iodinated Diagnostic Agents] Hives  . Doxycycline Diarrhea  . Erythromycin Diarrhea  . Hydrocodone Nausea Only  . Lactose Intolerance (Gi) Diarrhea    Bloating  . Minocycline Diarrhea  . Nsaids Other (See Comments)    Internal Bleeding  . Relafen [Nabumetone] Hives  . Sulfa Antibiotics Hives  . Tetracyclines & Related Diarrhea     REVIEW OF SYSTEMS(Negative unless checked)  Constitutional: [] ??Weight loss[] ??Fever[] ??Chills Cardiac:[] ??Chest pain[] ??Chest pressure[] ??Palpitations [] ??Shortness of breath when laying flat [] ??Shortness of breath at rest [] ??Shortness of breath with exertion. Vascular: [x] ??Pain in legs with  walking[] ??Pain in legsat rest[] ??Pain in legs when laying flat [x] ??Claudication [] ??Pain in feet when walking [] ??Pain in feet at rest [] ??Pain in feet when laying flat [] ??History of DVT [] ??Phlebitis [] ??Swelling in legs [] ??Varicose veins [] ??Non-healing ulcers Pulmonary: [] ??Uses home oxygen [] ??Productive cough[] ??Hemoptysis [] ??Wheeze [] ??COPD [] ??Asthma Neurologic: [] ??Dizziness [] ??Blackouts [] ??Seizures [] ??History of stroke [] ??History of TIA[] ??Aphasia [x] ??Temporary blindness[] ??Dysphagia [] ??Weaknessor numbness in arms [] ??Weakness or numbnessin legs Musculoskeletal: [] ??Arthritis [] ??Joint swelling [x] ??Joint pain [x] ??Low back pain Hematologic:[] ??Easy bruising[] ??Easy bleeding [] ??Hypercoagulable state [] ??Anemic [] ??Hepatitis Gastrointestinal:[] ??Blood in stool[] ??Vomiting blood[] ??Gastroesophageal reflux/heartburn[] ??Abdominal pain Genitourinary: [] ??Chronic kidney disease [] ??Difficulturination [] ??Frequenturination [] ??Burning with urination[] ??Hematuria Skin: [] ??Rashes [] ??Ulcers [] ??Wounds  Psychological: [] ??History of anxiety[] ??History of major depression.  Physical Examination  Vitals:   07/19/19 1154 07/19/19 1155  BP: (!) 158/77 119/75  Pulse: (!) 125   Resp: 15   Weight: 119 lb 6.4 oz (54.2 kg)    Body mass index is 22.56 kg/m. Gen:  WD/WN, NAD Head: [place]/AT, No temporalis wasting. Ear/Nose/Throat: Hearing grossly intact, nares w/o erythema or drainage, trachea midline Neck: Supple.  Left carotid bruit  Pulmonary:  Good air movement, equal and clear to auscultation bilaterally.  Cardiac: tachycardic Vascular:  Vessel Right Left  Radial Palpable Palpable                          PT 1+Palpable 1+Palpable  DP Trace Palpable Trace Palpable    Musculoskeletal: M/S 5/5 throughout.  No deformity or atrophy. No edema. Neurologic: CN 2-12 intact. Sensation  grossly intact in extremities.  Symmetrical.  Speech is fluent. Motor exam as listed above. Psychiatric: Judgment intact, Mood & affect appropriate for pt's clinical situation. Dermatologic: No rashes or ulcers noted.  No cellulitis or open wounds.      CBC Lab Results  Component Value Date   WBC 12.8 (H) 04/25/2019   HGB 13.9 04/25/2019   HCT 40.3 04/25/2019   MCV 91 04/25/2019   PLT 474 (H) 04/25/2019    BMET    Component Value Date/Time   NA 142 04/25/2019 1428   NA 132 (L) 09/12/2012 1540   K 4.6 04/25/2019 1428   K 3.8 09/11/2012 0401   CL 101 04/25/2019 1428   CL 99 09/11/2012 0401   CO2 26 04/25/2019 1428   CO2 24 09/11/2012 0401   GLUCOSE 82 04/25/2019 1428   GLUCOSE 126 (H) 10/31/2017 1300   GLUCOSE 262 (H) 09/11/2012 0401   BUN 10 04/25/2019 1428   BUN 22 (H) 09/11/2012 0401   CREATININE 0.82 04/25/2019 1428   CREATININE 0.86 09/11/2012 0401   CALCIUM 9.7 04/25/2019 1428   CALCIUM 8.5 09/11/2012 0401   GFRNONAA 69 04/25/2019 1428   GFRNONAA >60 09/11/2012 0401   GFRAA 79 04/25/2019 1428   GFRAA >60 09/11/2012 0401   CrCl cannot be calculated (Patient's most recent lab result is older than the maximum 21 days allowed.).  COAG Lab Results  Component Value Date   INR 1.07 10/07/2017    Radiology No results found.     Assessment/Plan HTN (hypertension) blood pressure control important in reducing the progression of atherosclerotic disease. On appropriate oral medications.   COPD (chronic obstructive pulmonary disease) (HCC) Continue pulmonary medications and aerosols as already ordered, these medications have been reviewed and there are no changes at this time.  Atherosclerosis of native artery of both lower extremities with intermittent claudication (HCC) Her ABIs today are basically stable at 0.66 on the right and 0.68 on the left with monophasic waveforms.  She has known aortoiliac occlusion and SFA occlusions.  We have opted for  conservative therapy because her symptoms have remained fairly stable. Would still continue conservative therapy and plan to see her back in 6 months.  Carotid stenosis Right carotid stenosis in the 40 to 59% range by duplex and left carotid stenosis in the 60 to 79% range by duplex.  No significant change from her previous studies.  Continue current medical regimen.  Recheck in 6 months.    Leotis Pain, MD  07/19/2019 12:25 PM    This note was created with Dragon medical transcription system.  Any errors from  dictation are purely unintentional

## 2019-07-19 NOTE — Assessment & Plan Note (Signed)
Right carotid stenosis in the 40 to 59% range by duplex and left carotid stenosis in the 60 to 79% range by duplex.  No significant change from her previous studies.  Continue current medical regimen.  Recheck in 6 months.

## 2019-08-06 ENCOUNTER — Other Ambulatory Visit: Payer: Self-pay

## 2019-08-06 ENCOUNTER — Encounter: Payer: Self-pay | Admitting: Ophthalmology

## 2019-08-12 ENCOUNTER — Other Ambulatory Visit: Payer: Self-pay

## 2019-08-12 ENCOUNTER — Other Ambulatory Visit
Admission: RE | Admit: 2019-08-12 | Discharge: 2019-08-12 | Disposition: A | Discharge status: Home / Self Care | Payer: Medicare Other | Source: Ambulatory Visit | Attending: Ophthalmology | Admitting: Ophthalmology

## 2019-08-12 DIAGNOSIS — Z20828 Contact with and (suspected) exposure to other viral communicable diseases: Secondary | ICD-10-CM | POA: Diagnosis not present

## 2019-08-12 DIAGNOSIS — Z01812 Encounter for preprocedural laboratory examination: Secondary | ICD-10-CM | POA: Insufficient documentation

## 2019-08-13 LAB — SARS CORONAVIRUS 2 (TAT 6-24 HRS): SARS Coronavirus 2: NEGATIVE

## 2019-08-13 NOTE — Discharge Instructions (Signed)

## 2019-08-14 ENCOUNTER — Ambulatory Visit
Admission: RE | Admit: 2019-08-14 | Discharge: 2019-08-14 | Disposition: A | Discharge status: Home / Self Care | Payer: Medicare Other | Attending: Ophthalmology | Admitting: Ophthalmology

## 2019-08-14 ENCOUNTER — Encounter: Payer: Self-pay | Admitting: Ophthalmology

## 2019-08-14 ENCOUNTER — Ambulatory Visit: Payer: Medicare Other | Admitting: Anesthesiology

## 2019-08-14 ENCOUNTER — Encounter
Admission: RE | Disposition: A | Discharge status: Home / Self Care | Payer: Self-pay | Source: Home / Self Care | Attending: Ophthalmology

## 2019-08-14 DIAGNOSIS — F419 Anxiety disorder, unspecified: Secondary | ICD-10-CM | POA: Diagnosis not present

## 2019-08-14 DIAGNOSIS — I739 Peripheral vascular disease, unspecified: Secondary | ICD-10-CM | POA: Diagnosis not present

## 2019-08-14 DIAGNOSIS — Z8673 Personal history of transient ischemic attack (TIA), and cerebral infarction without residual deficits: Secondary | ICD-10-CM | POA: Insufficient documentation

## 2019-08-14 DIAGNOSIS — H2512 Age-related nuclear cataract, left eye: Secondary | ICD-10-CM | POA: Insufficient documentation

## 2019-08-14 DIAGNOSIS — H5461 Unqualified visual loss, right eye, normal vision left eye: Secondary | ICD-10-CM | POA: Insufficient documentation

## 2019-08-14 DIAGNOSIS — K219 Gastro-esophageal reflux disease without esophagitis: Secondary | ICD-10-CM | POA: Diagnosis not present

## 2019-08-14 DIAGNOSIS — F17210 Nicotine dependence, cigarettes, uncomplicated: Secondary | ICD-10-CM | POA: Insufficient documentation

## 2019-08-14 DIAGNOSIS — E876 Hypokalemia: Secondary | ICD-10-CM | POA: Insufficient documentation

## 2019-08-14 DIAGNOSIS — I10 Essential (primary) hypertension: Secondary | ICD-10-CM | POA: Insufficient documentation

## 2019-08-14 DIAGNOSIS — G629 Polyneuropathy, unspecified: Secondary | ICD-10-CM | POA: Insufficient documentation

## 2019-08-14 DIAGNOSIS — J449 Chronic obstructive pulmonary disease, unspecified: Secondary | ICD-10-CM | POA: Diagnosis not present

## 2019-08-14 DIAGNOSIS — Z9981 Dependence on supplemental oxygen: Secondary | ICD-10-CM | POA: Insufficient documentation

## 2019-08-14 DIAGNOSIS — Z79899 Other long term (current) drug therapy: Secondary | ICD-10-CM | POA: Diagnosis not present

## 2019-08-14 HISTORY — PX: CATARACT EXTRACTION W/PHACO: SHX586

## 2019-08-14 SURGERY — PHACOEMULSIFICATION, CATARACT, WITH IOL INSERTION
Anesthesia: Monitor Anesthesia Care | Site: Eye | Laterality: Left

## 2019-08-14 MED ORDER — CYCLOPENTOLATE HCL 2 % OP SOLN
1.0000 [drp] | OPHTHALMIC | Status: DC | PRN
  Administered 2019-08-14 (×3): 1 [drp] via OPHTHALMIC

## 2019-08-14 MED ORDER — NA HYALUR & NA CHOND-NA HYALUR 0.4-0.35 ML IO KIT
PACK | INTRAOCULAR | Status: DC | PRN
  Administered 2019-08-14: 1 mL via INTRAOCULAR

## 2019-08-14 MED ORDER — ARMC OPHTHALMIC DILATING DROPS: 1.0000 "application " | OPHTHALMIC | Status: DC | PRN

## 2019-08-14 MED ORDER — LIDOCAINE HCL (PF) 2 % IJ SOLN
INTRAOCULAR | Status: DC | PRN
  Administered 2019-08-14: 2 mL

## 2019-08-14 MED ORDER — EPINEPHRINE PF 1 MG/ML IJ SOLN
INTRAOCULAR | Status: DC | PRN
  Administered 2019-08-14: 14:00:00 47 mL via OPHTHALMIC

## 2019-08-14 MED ORDER — MOXIFLOXACIN HCL 0.5 % OP SOLN
1.0000 [drp] | OPHTHALMIC | Status: DC | PRN
  Administered 2019-08-14 (×3): 1 [drp] via OPHTHALMIC

## 2019-08-14 MED ORDER — CEFUROXIME OPHTHALMIC INJECTION 1 MG/0.1 ML
INJECTION | OPHTHALMIC | Status: DC | PRN
  Administered 2019-08-14: 0.1 mL via INTRACAMERAL

## 2019-08-14 MED ORDER — PHENYLEPHRINE HCL 10 % OP SOLN
1.0000 [drp] | OPHTHALMIC | Status: DC | PRN
  Administered 2019-08-14 (×3): 1 [drp] via OPHTHALMIC

## 2019-08-14 MED ORDER — LACTATED RINGERS IV SOLN: 10.0000 mL/h | INTRAVENOUS | Status: DC

## 2019-08-14 MED ORDER — BRIMONIDINE TARTRATE-TIMOLOL 0.2-0.5 % OP SOLN
OPHTHALMIC | Status: DC | PRN
  Administered 2019-08-14: 1 [drp] via OPHTHALMIC

## 2019-08-14 MED ORDER — TETRACAINE HCL 0.5 % OP SOLN
1.0000 [drp] | OPHTHALMIC | Status: DC | PRN
  Administered 2019-08-14 (×3): 1 [drp] via OPHTHALMIC

## 2019-08-14 SURGICAL SUPPLY — 19 items
CANNULA ANT/CHMB 27G (MISCELLANEOUS) ×1 IMPLANT
CANNULA ANT/CHMB 27GA (MISCELLANEOUS) ×3 IMPLANT
GLOVE SURG LX 7.5 STRW (GLOVE) ×2
GLOVE SURG LX STRL 7.5 STRW (GLOVE) ×1 IMPLANT
GLOVE SURG TRIUMPH 8.0 PF LTX (GLOVE) ×3 IMPLANT
GOWN STRL REUS W/ TWL LRG LVL3 (GOWN DISPOSABLE) ×2 IMPLANT
GOWN STRL REUS W/TWL LRG LVL3 (GOWN DISPOSABLE) ×4
LENS IOL ACRYSOF IQ 22.0 (Intraocular Lens) ×2 IMPLANT
MARKER SKIN DUAL TIP RULER LAB (MISCELLANEOUS) ×3 IMPLANT
NDL FILTER BLUNT 18X1 1/2 (NEEDLE) IMPLANT
NEEDLE FILTER BLUNT 18X 1/2SAF (NEEDLE) ×4
NEEDLE FILTER BLUNT 18X1 1/2 (NEEDLE) ×2 IMPLANT
PACK CATARACT BRASINGTON (MISCELLANEOUS) ×3 IMPLANT
PACK EYE AFTER SURG (MISCELLANEOUS) ×3 IMPLANT
PACK OPTHALMIC (MISCELLANEOUS) ×3 IMPLANT
SYR 3ML LL SCALE MARK (SYRINGE) ×3 IMPLANT
SYR TB 1ML LUER SLIP (SYRINGE) ×3 IMPLANT
WATER STERILE IRR 500ML POUR (IV SOLUTION) ×3 IMPLANT
WIPE NON LINTING 3.25X3.25 (MISCELLANEOUS) ×3 IMPLANT

## 2019-08-14 NOTE — H&P (Signed)

## 2019-08-14 NOTE — Transfer of Care (Signed)
Immediate Anesthesia Transfer of Care Note  Patient: Savannah Mcclure  Procedure(s) Performed: CATARACT EXTRACTION PHACO AND INTRAOCULAR LENS PLACEMENT (IOC) LEFT 10.22 00:59.7 42.4% (Left Eye)  Patient Location: PACU  Anesthesia Type: MAC  Level of Consciousness: awake, alert  and patient cooperative  Airway and Oxygen Therapy: Patient Spontanous Breathing and Patient connected to supplemental oxygen  Post-op Assessment: Post-op Vital signs reviewed, Patient's Cardiovascular Status Stable, Respiratory Function Stable, Patent Airway and No signs of Nausea or vomiting  Post-op Vital Signs: Reviewed and stable  Complications: No apparent anesthesia complications

## 2019-08-14 NOTE — Anesthesia Preprocedure Evaluation (Signed)
Anesthesia Evaluation  Patient identified by MRN, date of birth, ID band Patient awake    Reviewed: Allergy & Precautions, H&P , NPO status , Patient's Chart, lab work & pertinent test results  Airway Mallampati: II  TM Distance: >3 FB Neck ROM: full    Dental no notable dental hx.    Pulmonary shortness of breath and with exertion, COPD,  COPD inhaler and oxygen dependent, Current Smoker and Patient abstained from smoking.,    Pulmonary exam normal breath sounds clear to auscultation       Cardiovascular hypertension, + Peripheral Vascular Disease  Normal cardiovascular exam Rhythm:regular Rate:Normal     Neuro/Psych CVA    GI/Hepatic GERD  ,  Endo/Other    Renal/GU      Musculoskeletal   Abdominal   Peds  Hematology   Anesthesia Other Findings   Reproductive/Obstetrics                             Anesthesia Physical Anesthesia Plan  ASA: IV  Anesthesia Plan: MAC   Post-op Pain Management:    Induction:   PONV Risk Score and Plan: 2 and Midazolam, TIVA and Treatment may vary due to age or medical condition  Airway Management Planned:   Additional Equipment:   Intra-op Plan:   Post-operative Plan:   Informed Consent: I have reviewed the patients History and Physical, chart, labs and discussed the procedure including the risks, benefits and alternatives for the proposed anesthesia with the patient or authorized representative who has indicated his/her understanding and acceptance.       Plan Discussed with: CRNA  Anesthesia Plan Comments:         Anesthesia Quick Evaluation

## 2019-08-14 NOTE — Anesthesia Postprocedure Evaluation (Signed)
Anesthesia Post Note  Patient: Savannah Mcclure  Procedure(s) Performed: CATARACT EXTRACTION PHACO AND INTRAOCULAR LENS PLACEMENT (IOC) LEFT 10.22 00:59.7 42.4% (Left Eye)     Patient location during evaluation: PACU Anesthesia Type: MAC Level of consciousness: awake and alert and oriented Pain management: satisfactory to patient Vital Signs Assessment: post-procedure vital signs reviewed and stable Respiratory status: spontaneous breathing, nonlabored ventilation and respiratory function stable Cardiovascular status: blood pressure returned to baseline and stable Postop Assessment: Adequate PO intake and No signs of nausea or vomiting Anesthetic complications: no    Raliegh Ip

## 2019-08-14 NOTE — Op Note (Signed)
OPERATIVE NOTE  [name] [id number] 08/14/2019   PREOPERATIVE DIAGNOSIS:  Nuclear sclerotic cataract left eye. H25.12   POSTOPERATIVE DIAGNOSIS:    Nuclear sclerotic cataract left eye.     PROCEDURE:  Phacoemusification with posterior chamber intraocular lens placement of the left eye  Ultrasound time: Procedure(s) with comments: CATARACT EXTRACTION PHACO AND INTRAOCULAR LENS PLACEMENT (IOC) LEFT 10.22 00:59.7 42.4% (Left) - leave it last patient per Zerrick Hanssen  LENS:   Implant Name Type Inv. Item Serial No. Manufacturer Lot No. LRB No. Used Action  LENS IOL ACRYSOF IQ 22.0 - [id number] Intraocular Lens LENS IOL ACRYSOF IQ 22.0 [id number] [employer]  Left 1 Implanted      SURGEON:  Wyonia Hough, MD   ANESTHESIA:  Topical with tetracaine drops and 2% Xylocaine jelly, augmented with 1% preservative-free intracameral lidocaine.    COMPLICATIONS:  None.   DESCRIPTION OF PROCEDURE:  The patient was identified in the holding room and transported to the operating room and placed in the supine position under the operating microscope.  The left eye was identified as the operative eye and it was prepped and draped in the usual sterile ophthalmic fashion.   A 1 millimeter clear-corneal paracentesis was made at the 1:30 position.  0.5 ml of preservative-free 1% lidocaine was injected into the anterior chamber.  The anterior chamber was filled with Viscoat viscoelastic.  A 2.4 millimeter keratome was used to make a near-clear corneal incision at the 10:30 position.  .  A curvilinear capsulorrhexis was made with a cystotome and capsulorrhexis forceps.  Balanced salt solution was used to hydrodissect and hydrodelineate the nucleus.   Phacoemulsification was then used in stop and chop fashion to remove the lens nucleus and epinucleus.  The remaining cortex was then removed using the irrigation and aspiration handpiece. Provisc was then placed into the capsular bag to distend it  for lens placement.  A lens was then injected into the capsular bag.  The remaining viscoelastic was aspirated.   Wounds were hydrated with balanced salt solution.  The anterior chamber was inflated to a physiologic pressure with balanced salt solution.  No wound leaks were noted. Cefuroxime 0.1 ml of a 10mg /ml solution was injected into the anterior chamber for a dose of 1 mg of intracameral antibiotic at the completion of the case.   Timolol and Brimonidine drops were applied to the eye.  The patient was taken to the recovery room in stable condition without complications of anesthesia or surgery.  Kiyoshi Schaab 08/14/2019, 2:19 PM

## 2019-08-14 NOTE — Anesthesia Procedure Notes (Signed)
Procedure Name: MAC Date/Time: 08/14/2019 2:05 PM Performed by: Mayme Genta, CRNA Pre-anesthesia Checklist: Patient identified, Emergency Drugs available, Suction available, Timeout performed and Patient being monitored Patient Re-evaluated:Patient Re-evaluated prior to induction Oxygen Delivery Method: Nasal cannula Placement Confirmation: positive ETCO2

## 2019-10-24 ENCOUNTER — Ambulatory Visit: Payer: Medicare Other | Admitting: Family Medicine

## 2019-10-24 ENCOUNTER — Other Ambulatory Visit: Payer: Self-pay

## 2019-10-24 ENCOUNTER — Encounter: Payer: Self-pay | Admitting: Family Medicine

## 2019-10-24 ENCOUNTER — Telehealth (INDEPENDENT_AMBULATORY_CARE_PROVIDER_SITE_OTHER): Payer: Medicare Other | Admitting: Family Medicine

## 2019-10-24 DIAGNOSIS — I10 Essential (primary) hypertension: Secondary | ICD-10-CM

## 2019-10-24 DIAGNOSIS — J410 Simple chronic bronchitis: Secondary | ICD-10-CM | POA: Diagnosis not present

## 2019-10-24 DIAGNOSIS — J301 Allergic rhinitis due to pollen: Secondary | ICD-10-CM | POA: Diagnosis not present

## 2019-10-24 DIAGNOSIS — D649 Anemia, unspecified: Secondary | ICD-10-CM

## 2019-10-24 DIAGNOSIS — I70213 Atherosclerosis of native arteries of extremities with intermittent claudication, bilateral legs: Secondary | ICD-10-CM | POA: Diagnosis not present

## 2019-10-24 DIAGNOSIS — I739 Peripheral vascular disease, unspecified: Secondary | ICD-10-CM | POA: Diagnosis not present

## 2019-10-24 DIAGNOSIS — F419 Anxiety disorder, unspecified: Secondary | ICD-10-CM | POA: Diagnosis not present

## 2019-10-24 MED ORDER — OMEPRAZOLE 20 MG PO CPDR: 20.0000 mg | DELAYED_RELEASE_CAPSULE | Freq: Every day | ORAL | 3 refills | Status: DC

## 2019-10-24 MED ORDER — HYDROXYZINE HCL 25 MG PO TABS: 25.0000 mg | ORAL_TABLET | Freq: Three times a day (TID) | ORAL | 3 refills | Status: DC | PRN

## 2019-10-24 MED ORDER — PREDNISONE 50 MG PO TABS: 50.0000 mg | ORAL_TABLET | Freq: Every day | ORAL | 0 refills | Status: DC

## 2019-10-24 MED ORDER — SPIRIVA HANDIHALER 18 MCG IN CAPS: ORAL_CAPSULE | RESPIRATORY_TRACT | 3 refills | Status: DC

## 2019-10-24 MED ORDER — BENAZEPRIL HCL 10 MG PO TABS: 10.0000 mg | ORAL_TABLET | Freq: Every day | ORAL | 1 refills | Status: DC

## 2019-10-24 MED ORDER — MONTELUKAST SODIUM 10 MG PO TABS: 10.0000 mg | ORAL_TABLET | Freq: Every day | ORAL | 3 refills | Status: DC

## 2019-10-24 MED ORDER — CILOSTAZOL 100 MG PO TABS: 100.0000 mg | ORAL_TABLET | Freq: Two times a day (BID) | ORAL | 4 refills | Status: DC

## 2019-10-24 NOTE — Assessment & Plan Note (Signed)
Will keep BP and cholesterol under good control. Continue to monitor. Call with any concerns.  

## 2019-10-24 NOTE — Assessment & Plan Note (Signed)
Under good control on current regimen. Continue current regimen. Continue to monitor. Call with any concerns. Refills given.   

## 2019-10-24 NOTE — Progress Notes (Signed)
There were no vitals taken for this visit.   Subjective:    Patient ID: Savannah Mcclure, female    DOB: 1940-10-28, 79 y.o.   MRN: [id number]  HPI: Savannah Mcclure is a 79 y.o. female  Chief Complaint  Patient presents with  . Hypertension  . COPD  . Anemia  . URI   UPPER RESPIRATORY TRACT INFECTION Duration: chonic- worse in the past month Worst symptom: bloody Fever: no Cough: no Shortness of breath: no Wheezing: no Chest pain: no Chest tightness: no Chest congestion: no Nasal congestion: yes Runny nose: yes Post nasal drip: yes Sneezing: no Sore throat: no Swollen glands: no Sinus pressure: yes Headache: yes Face pain: no Toothache: no Ear pain: no  Ear pressure: no  Eyes red/itching:yes Eye drainage/crusting: no  Vomiting: no Rash: no Fatigue: no Sick contacts: no Strep contacts: no  Context: stable Recurrent sinusitis: no Relief with OTC cold/cough medications: no  Treatments attempted: pseudoephedrine   HYPERTENSION Hypertension status: controlled  Satisfied with current treatment? yes Duration of hypertension: chronic BP monitoring frequency:  not checking BP medication side effects:  no Medication compliance: excellent compliance Previous BP meds: benazepril Aspirin: no Recurrent headaches: no Visual changes: no Palpitations: no Dyspnea: no Chest pain: no Lower extremity edema: no Dizzy/lightheaded: yes- 1x last month  ANEMIA Anemia status: stable Etiology of anemia: iron def Duration of anemia treatment: chronic  Compliance with treatment: excellent compliance Iron supplementation side effects: no Severity of anemia: mild Fatigue: no Decreased exercise tolerance: no  Dyspnea on exertion: no Palpitations: no Bleeding: no Pica: yes  COPD COPD status: controlled Satisfied with current treatment?: yes Oxygen use: no Dyspnea frequency:  Cough frequency:  Rescue inhaler frequency:   Limitation of activity:  no Productive cough: no Pneumovax: Not up to Date Influenza: Up to Date  Relevant past medical, surgical, family and social history reviewed and updated as indicated. Interim medical history since our last visit reviewed. Allergies and medications reviewed and updated.  Review of Systems  Constitutional: Positive for fatigue. Negative for activity change, appetite change, chills, diaphoresis, fever and unexpected weight change.  HENT: Positive for congestion, postnasal drip, rhinorrhea and sinus pressure. Negative for dental problem, drooling, ear discharge, ear pain, facial swelling, hearing loss, mouth sores, nosebleeds, sinus pain, sneezing, sore throat, tinnitus, trouble swallowing and voice change.   Eyes: Negative.   Respiratory: Negative.   Cardiovascular: Negative.   Gastrointestinal: Negative.   Musculoskeletal: Positive for arthralgias, back pain and myalgias. Negative for gait problem, joint swelling, neck pain and neck stiffness.  Skin: Negative.   Neurological: Negative.   Psychiatric/Behavioral: Negative.     Per HPI unless specifically indicated above     Objective:    There were no vitals taken for this visit.  Wt Readings from Last 3 Encounters:  08/06/19 116 lb (52.6 kg)  07/19/19 119 lb 6.4 oz (54.2 kg)  05/01/19 119 lb (54 kg)    Physical Exam Vitals and nursing note reviewed.  Constitutional:      General: She is not in acute distress.    Appearance: Normal appearance. She is not ill-appearing, toxic-appearing or diaphoretic.  HENT:     Head: Normocephalic and atraumatic.     Right Ear: External ear normal.     Left Ear: External ear normal.     Nose: Nose normal.     Mouth/Throat:     Mouth: Mucous membranes are moist.     Pharynx: Oropharynx is clear.  Eyes:  General: No scleral icterus.       Right eye: No discharge.        Left eye: No discharge.     Conjunctiva/sclera: Conjunctivae normal.     Pupils: Pupils are equal, round, and reactive  to light.  Pulmonary:     Effort: Pulmonary effort is normal. No respiratory distress.     Comments: Speaking in full sentences Musculoskeletal:        General: Normal range of motion.     Cervical back: Normal range of motion.  Skin:    Coloration: Skin is not jaundiced or pale.     Findings: No bruising, erythema, lesion or rash.  Neurological:     Mental Status: She is alert and oriented to person, place, and time. Mental status is at baseline.  Psychiatric:        Mood and Affect: Mood normal.        Behavior: Behavior normal.        Thought Content: Thought content normal.        Judgment: Judgment normal.     Results for orders placed or performed during the hospital encounter of 08/12/19  SARS CORONAVIRUS 2 (TAT 6-24 HRS) Nasopharyngeal Nasopharyngeal Swab   Specimen: Nasopharyngeal Swab  Result Value Ref Range   SARS Coronavirus 2 NEGATIVE NEGATIVE      Assessment & Plan:   Problem List Items Addressed This Visit      Cardiovascular and Mediastinum   HTN (hypertension) - Primary    Feeling well. Continue current regimen. Continue to monitor. Call with any concerns. Will come in for BP and labs next week when she's feeling better.       Relevant Medications   benazepril (LOTENSIN) 10 MG tablet   Other Relevant Orders   Comprehensive metabolic panel   PAD (peripheral artery disease) ([facility])    Will keep BP and cholesterol under good control. Continue to monitor. Call with any concerns.       Relevant Medications   benazepril (LOTENSIN) 10 MG tablet   Other Relevant Orders   Comprehensive metabolic panel     Respiratory   COPD (chronic obstructive pulmonary disease) ([facility])    Under good control on current regimen. Continue current regimen. Continue to monitor. Call with any concerns. Refills given.        Relevant Medications   tiotropium (SPIRIVA HANDIHALER) 18 MCG inhalation capsule   montelukast (SINGULAIR) 10 MG tablet   predniSONE (DELTASONE) 50 MG  tablet   Other Relevant Orders   Comprehensive metabolic panel     Other   Anxiety    Under good control on current regimen. Continue current regimen. Continue to monitor. Call with any concerns. Refills given.        Relevant Medications   hydrOXYzine (ATARAX/VISTARIL) 25 MG tablet   Other Relevant Orders   Comprehensive metabolic panel   Anemia    Will recheck labs. Treat as needed. Call with any concerns.       Relevant Orders   CBC with Differential/Platelet   Comprehensive metabolic panel    Other Visit Diagnoses    Seasonal allergic rhinitis due to pollen       Will treat with burst of prednisone. Call wif not getting better or getting worse.        Follow up plan: Return July- wellness.   . This visit was completed via MyChart due to the restrictions of the COVID-19 pandemic. All issues as above were discussed and addressed. Physical  exam was done as above through visual confirmation on MyChart. If it was felt that the patient should be evaluated in the office, they were directed there. The patient verbally consented to this visit. . Location of the patient: parking lot . Location of the provider: work . Those involved with this call:  . Provider: Park Liter, DO . CMA: Tiffany Reel, CMA . Front Desk/Registration: Don Perking  . Time spent on call: 25 minutes with patient face to face via video conference. More than 50% of this time was spent in counseling and coordination of care. 40 minutes total spent in review of patient's record and preparation of their chart.

## 2019-10-24 NOTE — Assessment & Plan Note (Signed)
Feeling well. Continue current regimen. Continue to monitor. Call with any concerns. Will come in for BP and labs next week when she's feeling better.

## 2019-10-24 NOTE — Assessment & Plan Note (Signed)
Will recheck labs. Treat as needed. Call with any concerns.

## 2019-10-29 ENCOUNTER — Telehealth: Payer: Self-pay | Admitting: Family Medicine

## 2019-10-29 NOTE — Telephone Encounter (Signed)
LVM for pt to call back. Also sending letter through mail and mychart.

## 2019-10-29 NOTE — Telephone Encounter (Signed)
-----   Message from [name], [place] sent at 10/24/2019  2:16 PM EST ----- July- wellness/follow up

## 2019-12-09 NOTE — Telephone Encounter (Signed)
LVM for pt to call back.

## 2020-01-01 ENCOUNTER — Encounter: Payer: Self-pay | Admitting: Student in an Organized Health Care Education/Training Program

## 2020-01-02 ENCOUNTER — Encounter: Payer: Self-pay | Admitting: Student in an Organized Health Care Education/Training Program

## 2020-01-02 ENCOUNTER — Ambulatory Visit
Payer: Medicare Other | Attending: Student in an Organized Health Care Education/Training Program | Admitting: Student in an Organized Health Care Education/Training Program

## 2020-01-02 ENCOUNTER — Other Ambulatory Visit: Payer: Self-pay

## 2020-01-02 DIAGNOSIS — M47816 Spondylosis without myelopathy or radiculopathy, lumbar region: Secondary | ICD-10-CM

## 2020-01-02 DIAGNOSIS — G894 Chronic pain syndrome: Secondary | ICD-10-CM

## 2020-01-02 DIAGNOSIS — M4120 Other idiopathic scoliosis, site unspecified: Secondary | ICD-10-CM | POA: Diagnosis not present

## 2020-01-02 DIAGNOSIS — M47814 Spondylosis without myelopathy or radiculopathy, thoracic region: Secondary | ICD-10-CM | POA: Diagnosis not present

## 2020-01-02 DIAGNOSIS — M5136 Other intervertebral disc degeneration, lumbar region: Secondary | ICD-10-CM

## 2020-01-02 MED ORDER — GABAPENTIN 300 MG PO CAPS: 300.0000 mg | ORAL_CAPSULE | Freq: Two times a day (BID) | ORAL | 2 refills | Status: DC

## 2020-01-02 MED ORDER — BUPRENORPHINE 5 MCG/HR TD PTWK: 1.0000 | MEDICATED_PATCH | TRANSDERMAL | 0 refills | Status: DC

## 2020-01-02 NOTE — Progress Notes (Signed)
Patient: Savannah Mcclure  Service Category: E/M  Provider: Gillis Santa, MD  DOB: 01/30/1941  DOS: 01/02/2020  Location: Office  MRN: [id number]  Setting: Ambulatory outpatient  Referring Provider: [name], DO  Type: Established Patient  Specialty: Interventional Pain Management  PCP: [name], DO  Location: Home  Delivery: TeleHealth     Virtual Encounter - Pain Management PROVIDER NOTE: Information contained herein reflects review and annotations entered in association with encounter. Interpretation of such information and data should be left to medically-trained personnel. Information provided to patient can be located elsewhere in the medical record under "Patient Instructions". Document created using STT-dictation technology, any transcriptional errors that may result from process are unintentional.    Contact & Pharmacy Preferred: [phone] Home: [phone] (home) Mobile: [phone] (mobile) E-mail: [email or web address]  [facility] - [address], [place] [address] Phone: [phone] Fax: [place], [place] [address] Phone: [phone] Fax: [place], [place] [address] [place] [address] Phone: [phone] Fax: [phone]   Pre-screening  Savannah Mcclure offered "in-person" vs "virtual" encounter. She indicated preferring virtual for this encounter.   Reason COVID-19*  Social distancing based on CDC and AMA recommendations.   I contacted [facility] on 01/02/2020 via video conference.      I clearly identified myself as Gillis Santa, MD. I verified that I was speaking with the correct person using two identifiers (Name: Savannah Mcclure, and date of birth: Jan 16, 1941).  Consent I sought  verbal advanced consent from [place] for virtual visit interactions. I informed Savannah Mcclure of possible security and privacy concerns, risks, and limitations associated with providing "not-in-person" medical evaluation and management services. I also informed Savannah Mcclure of the availability of "in-person" appointments. Finally, I informed her that there would be a charge for the virtual visit and that she could be  personally, fully or partially, financially responsible for it. Savannah Mcclure expressed understanding and agreed to proceed.   Historic Elements   Savannah Mcclure is a 79 y.o. year old, female patient evaluated today after her last contact with our practice on Visit date not found. Savannah Mcclure  has a past medical history of Aneurysm ([place]), Anxiety, Blind right eye, Cardiac arrest ([place]), Cataract, Claudication ([place]), Complication of anesthesia (03/15/2012), COPD (chronic obstructive pulmonary disease) ([place]), Dyspnea, GERD (gastroesophageal reflux disease), Heart murmur, Hypertension, Leaky heart valve, Oxygen deficit, Peripheral vascular disease ([place]), Scoliosis, Sepsis ([place]), Spinal stenosis, Stroke ([place]), and Tachycardia. She also  has a past surgical history that includes right eye surgery; Wisdom tooth extraction; Back surgery; Abdominal hysterectomy; Breast lumpectomy (Left); Lower Extremity Angiography (Right, 06/20/2018); and Cataract extraction w/PHACO (Left, 08/14/2019). Savannah Mcclure has a current medication list which includes the following prescription(s): b complex-c, benazepril, cilostazol, cyclobenzaprine, diphenhydramine, durezol, gabapentin, hydroxyzine, lidocaine, misc natural products, montelukast, multivitamin with minerals, niacinamide, omeprazole, pseudoephedrine hcl, spiriva handihaler, tramadol, UNABLE TO FIND, UNABLE TO FIND, buprenorphine, ipratropium-albuterol, and prednisone. She  reports that she has been smoking  cigarettes. She has a 15.00 pack-year smoking history. She has never used smokeless tobacco. She reports current alcohol use. She reports that she does not use drugs. Savannah Mcclure is allergic to other; oxycodone; amlodipine; augmentin [amoxicillin-pot clavulanate]; azithromycin; contrast media [iodinated  diagnostic agents]; doxycycline; erythromycin; hydrocodone; lactose intolerance (gi); minocycline; nsaids; relafen [nabumetone]; sulfa antibiotics; and tetracyclines & related.   HPI  Today, she is being contacted for medication management.   Worsening LBP after 8 hr car ride yesterday. States that Tramadol is not very helpful and has caused her to break out in hives at times.  She has also tried oxycodone and hydrocodone in the past and states that it caused too much sedation and was not effective for her chronic pain given the ups and downs that she would feel with it.  It seems that the patient would benefit from a long-acting opioid analgesic that has a low side effect potential in terms of cognitive side effects and dependence.  We discussed Butrans, buprenorphine for chronic pain management.  Risks and benefits reviewed and patient like to proceed.  Patient also requesting refill of her gabapentin.  Pharmacotherapy Assessment  Analgesic: Discontinue tramadol, start Butrans patch as below. Monitoring: [place] PMP: PDMP reviewed during this encounter.       Pharmacotherapy: No side-effects or adverse reactions reported. Compliance: No problems identified. Effectiveness: Clinically acceptable. Plan: Refer to "POC".  UDS:  Summary  Date Value Ref Range Status  04/24/2019 Note  Final    Comment:    ==================================================================== [employer] Select 13 (MW) ==================================================================== Test                             Result       Flag       Units Drug Absent but Declared for Prescription Verification   Tramadol                        Not Detected UNEXPECTED ng/mg creat ==================================================================== Test                      Result    Flag   Units      Ref Range   Creatinine              53               mg/dL      >=20 ==================================================================== Declared Medications:  The flagging and interpretation on this report are based on the  following declared medications.  Unexpected results may arise from  inaccuracies in the declared medications.  **Note: The testing scope of this panel includes these medications:  Tramadol  **Note: The testing scope of this panel does not include the  following reported medications:  Albuterol (Duoneb)  Benazepril  Cefuroxime  Cilostazol (Pletal)  Cyclobenzaprine  Gabapentin  Hydroxyzine  Ipratropium (Duoneb)  Magnesium  Montelukast  Multivitamin  Niacinamide  Omeprazole  Potassium  Supplement  Tiotropium  Topical Lidocaine  Vitamin B ==================================================================== For clinical consultation, please call [phone]. ====================================================================    Laboratory Chemistry Profile   Renal Lab Results  Component Value Date   BUN 10 04/25/2019   CREATININE 0.82 04/25/2019   BCR 12 04/25/2019   GFRAA 79 04/25/2019   GFRNONAA 69 04/25/2019     Hepatic Lab Results  Component Value Date   AST 15 04/25/2019   ALT 11 04/25/2019   ALBUMIN 4.7 04/25/2019   ALKPHOS 80 04/25/2019     Electrolytes Lab Results  Component Value Date   NA 142 04/25/2019   K 4.6 04/25/2019   CL 101 04/25/2019   CALCIUM 9.7 04/25/2019   MG 1.8 10/13/2017  PHOS 3.2 10/13/2017     Bone No results found for: VD25OH, [id number], [id number], [id number], 25OHVITD1, 25OHVITD2, 25OHVITD3, TESTOFREE, TESTOSTERONE   Inflammation (CRP: Acute Phase) (ESR: Chronic Phase) Lab Results  Component Value Date   LATICACIDVEN 1.8  10/09/2017       Note: Above Lab results reviewed.  Imaging  VAS US CAROTID Carotid Arterial Duplex Study  Comparison Study:  01/15/2019  Performing Technologist: Charlane Ferretti RT (R)(VS)    Examination Guidelines: A complete evaluation includes B-mode imaging, spectral Doppler, color Doppler, and power Doppler as needed of all accessible portions of each vessel. Bilateral testing is considered an integral part of a complete examination. Limited examinations for reoccurring indications may be performed as noted.    Right Carotid Findings: +----------+--------+--------+--------+------------------+--------------------+           PSV cm/sEDV cm/sStenosisPlaque DescriptionComments             +----------+--------+--------+--------+------------------+--------------------+ CCA Prox  77      9                                 intimal thickening   +----------+--------+--------+--------+------------------+--------------------+ CCA Mid   87      12                                                     +----------+--------+--------+--------+------------------+--------------------+ CCA Distal92      16              calcific                               +----------+--------+--------+--------+------------------+--------------------+ ICA Prox  166     30                                ICA/CCA ratio = 1.91 +----------+--------+--------+--------+------------------+--------------------+ ICA Mid   157     30                                                     +----------+--------+--------+--------+------------------+--------------------+ ICA Distal96      23                                                     +----------+--------+--------+--------+------------------+--------------------+ ECA       370     0                                                       +----------+--------+--------+--------+------------------+--------------------+  +----------+--------+-------+--------+-------------------+           PSV cm/sEDV cmsDescribeArm Pressure (mmHG) +----------+--------+-------+--------+-------------------+ Subclavian213                    135                 +----------+--------+-------+--------+-------------------+  +---------+--------+--+--------+--+  VertebralPSV cm/s85EDV cm/s15 +---------+--------+--+--------+--+  Left Carotid Findings: +----------+--------+--------+--------+------------------+--------------------+           PSV cm/sEDV cm/sStenosisPlaque DescriptionComments             +----------+--------+--------+--------+------------------+--------------------+ CCA Prox  84      9                                 intimal thickening   +----------+--------+--------+--------+------------------+--------------------+ CCA Mid   104     22                                intimal thickening   +----------+--------+--------+--------+------------------+--------------------+ CCA Distal102     22              calcific                               +----------+--------+--------+--------+------------------+--------------------+ ICA Prox  258     70              calcific          ICA/CCA ratio = 2.53 +----------+--------+--------+--------+------------------+--------------------+ ICA Mid   207     22                                                     +----------+--------+--------+--------+------------------+--------------------+ ICA Distal118     24                                                     +----------+--------+--------+--------+------------------+--------------------+ ECA       450     38                                                      +----------+--------+--------+--------+------------------+--------------------+  +----------+--------+--------+--------+-------------------+           PSV cm/sEDV cm/sDescribeArm Pressure (mmHG) +----------+--------+--------+--------+-------------------+ [id number]                     112                 +----------+--------+--------+--------+-------------------+  +---------+--------+--+--------+-+ VertebralPSV cm/s41EDV cm/s7 +---------+--------+--+--------+-+     Summary: Right Carotid: Velocities in the right ICA are consistent with a 40-59%                stenosis. The ECA appears >50% stenosed.  Left Carotid: Velocities in the left ICA are consistent with a 60-79% stenosis.               The ECA appears >50% stenosed.  Vertebrals:  Bilateral vertebral arteries demonstrate antegrade flow. Right              vertebral demonstrates abnormal flow pattern. Subclavians: Bilateral subclavian artery flow was disturbed.  *See table(s) above for measurements and observations.    Electronically signed by Leotis Pain MD on 07/23/2019 at 3:04:33 PM.  Final   VAS [place] ABI WITH/WO TBI LOWER EXTREMITY DOPPLER STUDY  Indications: Peripheral artery disease, and Aorta occlusion              Bilat CIA, EIA, SFA occlusions with bilat PFA's providing              collateral flow.   Vascular Interventions: Patient states a history of right leg angioplasty.  Comparison Study: 01/15/2019  Performing Technologist: Charlane Ferretti RT (R)(VS)    Examination Guidelines: A complete evaluation includes at minimum, Doppler waveform signals and systolic blood pressure reading at the level of bilateral brachial, anterior tibial, and posterior tibial arteries, when vessel segments are accessible. Bilateral testing is considered an integral part of a complete examination. Photoelectric Plethysmograph (PPG) waveforms and toe systolic pressure readings are included as required  and additional duplex testing as needed. Limited examinations for reoccurring indications may be performed as noted.    ABI Findings: +---------+------------------+-----+----------+--------+ Right    Rt Pressure (mmHg)IndexWaveform  Comment  +---------+------------------+-----+----------+--------+ Brachial 135                                       +---------+------------------+-----+----------+--------+ ATA      82                0.61 monophasic         +---------+------------------+-----+----------+--------+ PTA      89                0.66 monophasic         +---------+------------------+-----+----------+--------+ Great Toe41                0.30 Abnormal           +---------+------------------+-----+----------+--------+  +---------+------------------+-----+----------+-------+ Left     Lt Pressure (mmHg)IndexWaveform  Comment +---------+------------------+-----+----------+-------+ Brachial 112                                      +---------+------------------+-----+----------+-------+ ATA      92                0.68 monophasic        +---------+------------------+-----+----------+-------+ PTA      31                0.23 monophasic        +---------+------------------+-----+----------+-------+ Great Toe61                0.45 Abnormal          +---------+------------------+-----+----------+-------+  +-------+-----------+-----------+------------+------------+ ABI/TBIToday's ABIToday's TBIPrevious ABIPrevious TBI +-------+-----------+-----------+------------+------------+ Right  .66        .30        .75                      +-------+-----------+-----------+------------+------------+ Left   .68        .45        .72                      +-------+-----------+-----------+------------+------------+  Bilateral ABIs appear essentially unchanged compared to prior study on 01/15/2019.   Summary: Right: Resting  right ankle-brachial index indicates moderate right lower extremity arterial disease. The right toe-brachial index is abnormal.  Left: Resting left ankle-brachial index indicates moderate left lower extremity arterial disease. The left toe-brachial index is  abnormal.  *See table(s) above for measurements and observations.    Electronically signed by Leotis Pain MD on 07/23/2019 at 3:04:28 PM.    Final    Assessment  The primary encounter diagnosis was Chronic pain syndrome. Diagnoses of Scoliosis (and kyphoscoliosis), idiopathic, Thoracic spondylosis without myelopathy, Spondylosis without myelopathy or radiculopathy, lumbar region, and Lumbar degenerative disc disease were also pertinent to this visit.  Plan of Care  [name] has a current medication list which includes the following long-term medication(s): benazepril, gabapentin, montelukast, omeprazole, spiriva handihaler, and ipratropium-albuterol.  Pharmacotherapy (Medications Ordered): Meds ordered this encounter  Medications  . buprenorphine (BUTRANS) 5 MCG/HR PTWK    Sig: Place 1 patch onto the skin every 7 (seven) days.    Dispense:  4 patch    Refill:  0    Do not place this medication, or any other prescription from our practice, on "Automatic Refill". Patient may have prescription filled one day early if pharmacy is closed on scheduled refill date.  . gabapentin (NEURONTIN) 300 MG capsule    Sig: Take 1 capsule (300 mg total) by mouth 2 (two) times daily.    Dispense:  180 capsule    Refill:  2   Follow-up plan:   Return in about 4 weeks (around 01/30/2020) for Medication Management, in person.     Status post T11, T12, L1 RFA bilaterally on 03/04/2019, 04/2018    Recent Visits No visits were found meeting these conditions.  Showing recent visits within past 90 days and meeting all other requirements   Today's Visits Date Type Provider Dept  01/02/20 Telemedicine Gillis Santa, MD [facility]  Showing today's visits and meeting all other requirements   Future Appointments No visits were found meeting these conditions.  Showing future appointments within next 90 days and meeting all other requirements   I discussed the assessment and treatment plan with the patient. The patient was provided an opportunity to ask questions and all were answered. The patient agreed with the plan and demonstrated an understanding of the instructions.  Patient advised to call back or seek an in-person evaluation if the symptoms or condition worsens.  Duration of encounter: 25 minutes.  Note by: Gillis Santa, MD Date: 01/02/2020; Time: 9:12 AM

## 2020-01-06 ENCOUNTER — Telehealth: Payer: Self-pay | Admitting: Student in an Organized Health Care Education/Training Program

## 2020-01-06 NOTE — Telephone Encounter (Signed)
Patient notified per voicemail that Buprenorphine patches have been approved.

## 2020-01-06 NOTE — Telephone Encounter (Signed)
Patient is calling to check on PA for her pain patches? Said someone was working on it Friday? Please let patient know status

## 2020-01-09 ENCOUNTER — Telehealth: Payer: Self-pay | Admitting: Student in an Organized Health Care Education/Training Program

## 2020-01-09 NOTE — Telephone Encounter (Signed)
Patient Savannah Mcclure 01-08-20 at 5:39 stating her patches are not helping her pain and she is in pain all the time. Has Med Mgmt appt 01-30-20.  Wants to speak with someone about this.

## 2020-01-09 NOTE — Telephone Encounter (Signed)
Attempted to call patient. No answer and no AM to leave a message

## 2020-01-10 ENCOUNTER — Telehealth: Payer: Self-pay | Admitting: *Deleted

## 2020-01-10 NOTE — Telephone Encounter (Signed)
Returned pt call concerning the pain patches that Dr. Holley Raring prescribed on the 01/02/20. Pt stated that the patches are working now and at first she was feeling little light head but better now. Informed pt to call back if needed.

## 2020-01-24 ENCOUNTER — Other Ambulatory Visit: Payer: Self-pay

## 2020-01-24 ENCOUNTER — Ambulatory Visit (INDEPENDENT_AMBULATORY_CARE_PROVIDER_SITE_OTHER): Payer: Medicare Other | Admitting: Nurse Practitioner

## 2020-01-24 ENCOUNTER — Ambulatory Visit (INDEPENDENT_AMBULATORY_CARE_PROVIDER_SITE_OTHER): Payer: Medicare Other

## 2020-01-24 ENCOUNTER — Encounter (INDEPENDENT_AMBULATORY_CARE_PROVIDER_SITE_OTHER): Payer: Self-pay | Admitting: Nurse Practitioner

## 2020-01-24 VITALS — BP 182/82 | HR 134 | Ht 61.0 in | Wt 120.0 lb

## 2020-01-24 DIAGNOSIS — I6523 Occlusion and stenosis of bilateral carotid arteries: Secondary | ICD-10-CM | POA: Diagnosis not present

## 2020-01-24 DIAGNOSIS — Z87891 Personal history of nicotine dependence: Secondary | ICD-10-CM

## 2020-01-24 DIAGNOSIS — I10 Essential (primary) hypertension: Secondary | ICD-10-CM

## 2020-01-24 DIAGNOSIS — M48062 Spinal stenosis, lumbar region with neurogenic claudication: Secondary | ICD-10-CM

## 2020-01-24 DIAGNOSIS — I70213 Atherosclerosis of native arteries of extremities with intermittent claudication, bilateral legs: Secondary | ICD-10-CM | POA: Diagnosis not present

## 2020-01-24 NOTE — Progress Notes (Unsigned)
Take 

## 2020-01-27 ENCOUNTER — Encounter (INDEPENDENT_AMBULATORY_CARE_PROVIDER_SITE_OTHER): Payer: Self-pay | Admitting: Nurse Practitioner

## 2020-01-27 ENCOUNTER — Telehealth (INDEPENDENT_AMBULATORY_CARE_PROVIDER_SITE_OTHER): Payer: Self-pay

## 2020-01-27 NOTE — Progress Notes (Signed)
Subjective:    Patient ID: Savannah Mcclure, female    DOB: [birth year], 79 y.o.   MRN: [id number] Chief Complaint  Patient presents with  . Follow-up    U/S Follow up    The patient returns to the office for followup and review of the noninvasive studies. There has been a significant deterioration in the lower extremity symptoms.  The patient notes interval shortening of their claudication distance and development of mild rest pain symptoms. No new ulcers or wounds have occurred since the last visit.  While the patient is elderly, she is still fairly active and this has decreased her quality of life.  There have been no significant changes to the patient's overall health care.  The patient denies amaurosis fugax or recent TIA symptoms. There are no recent neurological changes noted. The patient denies history of DVT, PE or superficial thrombophlebitis. The patient denies recent episodes of angina or shortness of breath.   The patient is seen for follow up evaluation of carotid stenosis. The carotid stenosis followed by ultrasound.    ABI's Rt=0.68 and Lt=0.64 (previous ABI's Rt=0.75 and Lt=0.72) Duplex US of the lower extremity arterial system shows strong monophasic tibial artery waveforms bilaterally with dampened right toe waveforms and nearly flat left toe waveforms   Carotid Duplex done today shows 40 to 59% bilaterally.  There is a decrease in the left internal carotid artery velocities, compared to December 2020.   Review of Systems  Cardiovascular:       Claudication  Musculoskeletal: Positive for arthralgias and myalgias.  All other systems reviewed and are negative.      Objective:   Physical Exam Vitals reviewed.  HENT:     Head: Normocephalic.  Cardiovascular:     Rate and Rhythm: Regular rhythm. Tachycardia present.     Pulses: Normal pulses.     Heart sounds: Normal heart sounds.  Pulmonary:     Effort: Pulmonary effort is normal.     Breath  sounds: Normal breath sounds.  Neurological:     Mental Status: She is alert and oriented to person, place, and time.  Psychiatric:        Mood and Affect: Mood normal.        Behavior: Behavior normal.        Thought Content: Thought content normal.        Judgment: Judgment normal.     BP (!) 182/82   Pulse (!) 134   Ht 5\' 1"  (1.549 m)   Wt 120 lb (54.4 kg)   BMI 22.67 kg/m   Past Medical History:  Diagnosis Date  . 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 Anxiety   . Blind right eye   . Cardiac arrest ([place])   . Cataract    right eye  . Claudication ([place])   . Complication of anesthesia 03/15/2012   "crashed" during first part of 3-part back surgery.   Marland Kitchen COPD (chronic obstructive pulmonary disease) ([place])   . Dyspnea   . GERD (gastroesophageal reflux disease)   . Heart murmur   . Hypertension   . Leaky heart valve   . Oxygen deficit    uses O2 at night 2 liters  . Peripheral vascular disease ([place])   . Scoliosis   . Sepsis ([place])   . Spinal stenosis   . Stroke [facility])    Eye  . Tachycardia     Social History   Socioeconomic History  . Marital status: Widowed  Spouse name: Not on file  . Number of children: Not on file  . Years of education: college   . Highest education level: Not on file  Occupational History  . Occupation: retired  Tobacco Use  . Smoking status: Current Every Day Smoker    Packs/day: 0.50    Years: 30.00    Pack years: 15.00    Types: Cigarettes  . Smokeless tobacco: Never Used  . Tobacco comment: since "mid 40s"  Vaping Use  . Vaping Use: Some days  Substance and Sexual Activity  . Alcohol use: Yes    Comment: wine on occasion (1x/month)  . Drug use: No  . Sexual activity: Not on file  Other Topics Concern  . Not on file  Social History Narrative  . Not on file   Social Determinants of Health   Financial Resource Strain:   . Difficulty of Paying Living Expenses:   Food Insecurity:   . Worried About Charity fundraiser in the  Last Year:   . Arboriculturist in the Last Year:   Transportation Needs:   . Film/video editor (Medical):   Marland Kitchen Lack of Transportation (Non-Medical):   Physical Activity: Insufficiently Active  . Days of Exercise per Week: 5 days  . Minutes of Exercise per Session: 20 min  Stress:   . Feeling of Stress :   Social Connections:   . Frequency of Communication with Friends and Family:   . Frequency of Social Gatherings with Friends and Family:   . Attends Religious Services:   . Active Member of Clubs or Organizations:   . Attends Archivist Meetings:   Marland Kitchen Marital Status:   Intimate Partner Violence:   . Fear of Current or Ex-Partner:   . Emotionally Abused:   Marland Kitchen Physically Abused:   . Sexually Abused:     Past Surgical History:  Procedure Laterality Date  . ABDOMINAL HYSTERECTOMY     total  . BACK SURGERY    . BREAST LUMPECTOMY Left   . CATARACT EXTRACTION W/PHACO Left 08/14/2019   Procedure: CATARACT EXTRACTION PHACO AND INTRAOCULAR LENS PLACEMENT (IOC) LEFT 10.22 00:59.7 42.4%;  Surgeon: Leandrew Koyanagi, MD;  Location: [facility];  Service: Ophthalmology;  Laterality: Left;  leave it last patient per [place]  . LOWER EXTREMITY ANGIOGRAPHY Right 06/20/2018   Procedure: LOWER EXTREMITY ANGIOGRAPHY;  Surgeon: Algernon Huxley, MD;  Location: [facility];  Service: Cardiovascular;  Laterality: Right;  . 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 WISDOM TOOTH EXTRACTION      Family History  Problem Relation Age of Onset  . Intracerebral hemorrhage Mother   . Hypertension Mother   . Heart failure Father   . Schizophrenia Daughter   . Rheum arthritis Maternal Grandmother   . Kidney disease Maternal Grandmother   . Heart disease Paternal Grandmother   . Stroke Paternal Grandmother   . Stroke Paternal Grandfather   . Heart disease Paternal Grandfather     Allergies  Allergen Reactions  . Other Itching    Cat Gut sutures   .  Oxycodone Hives  . Amlodipine Hives  . Augmentin [Amoxicillin-Pot Clavulanate] Diarrhea  . Azithromycin Hives  . Contrast Media [Iodinated Diagnostic Agents] Hives  . Doxycycline Diarrhea  . Erythromycin Diarrhea  . Hydrocodone Nausea Only  . Lactose Intolerance (Gi) Diarrhea    Bloating  . Minocycline Diarrhea  . Nsaids Other (See Comments)  Internal Bleeding  . Relafen [Nabumetone] Hives  . Sulfa Antibiotics Hives  . Tetracyclines & Related Diarrhea       Assessment & Plan:   1. Atherosclerosis of native artery of both lower extremities with intermittent claudication (HCC) Recommend:  The patient has experienced increased symptoms and is now describing lifestyle limiting claudication and mild rest pain.   Given the severity of the patient's lower extremity symptoms the patient should undergo angiography and intervention.  Risk and benefits were reviewed the patient.  Indications for the procedure were reviewed.  All questions were answered, the patient agrees to proceed.   Also discussed with the patient due to her known, extensive peripheral arterial disease, this angiogram may be more diagnostic than interventional.  The patient should continue walking and begin a more formal exercise program.  The patient should continue antiplatelet therapy and aggressive treatment of the lipid abnormalities  The patient will follow up with me after the angiogram.   2. Essential hypertension Continue antihypertensive medications as already ordered, these medications have been reviewed and there are no changes at this time.   3. History of tobacco abuse Discussed with patient how tobacco usage decreases the longevity of vascular intervention in addition to accelerate his atherosclerotic risk process.  Patient urged to stop smoking.  4. Spinal stenosis of lumbar region with neurogenic claudication The patient's claudication could certainly be related to her spinal stenosis.  However  given the multiple occlusions the patient has arterially, in addition to the dampened toe waveforms in her left lower extremity, treatment of peripheral arterial disease presents as a more urgent matter.   5. Bilateral carotid artery stenosis Recommend:  Given the patient's asymptomatic subcritical stenosis no further invasive testing or surgery at this time.  Duplex ultrasound shows 40 to 59% stenosis bilaterally, however in December the patient had a 60 to 79% stenosis in the left internal carotid artery.  Continue antiplatelet therapy as prescribed Continue management of CAD, HTN and Hyperlipidemia Healthy heart diet,  encouraged exercise at least 4 times per week Follow up in 6 months with duplex ultrasound and physical exam    Current Outpatient Medications on File Prior to Visit  Medication Sig Dispense Refill  . B-COMPLEX-C PO Take 1 tablet by mouth daily.    . benazepril (LOTENSIN) 10 MG tablet Take 1 tablet (10 mg total) by mouth daily. 90 tablet 1  . buprenorphine (BUTRANS) 5 MCG/HR PTWK Place 1 patch onto the skin every 7 (seven) days. 4 patch 0  . cilostazol (PLETAL) 100 MG tablet Take 1 tablet (100 mg total) by mouth 2 (two) times daily. 180 tablet 4  . cyclobenzaprine (FLEXERIL) 5 MG tablet Take 5 mg by mouth 3 (three) times daily as needed for muscle spasms.    . diphenhydrAMINE (BENADRYL) 25 MG tablet Take 25 mg by mouth every 6 (six) hours as needed.    . DUREZOL 0.05 % EMUL     . gabapentin (NEURONTIN) 300 MG capsule Take 1 capsule (300 mg total) by mouth 2 (two) times daily. 180 capsule 2  . hydrOXYzine (ATARAX/VISTARIL) 25 MG tablet Take 1 tablet (25 mg total) by mouth 3 (three) times daily as needed. 270 tablet 3  . ipratropium-albuterol (DUONEB) 0.5-2.5 (3) MG/3ML SOLN Take 3 mLs by nebulization every 6 (six) hours as needed. 360 mL 6  . lidocaine (LIDODERM) 5 % Place 1 patch onto the skin daily. Remove & Discard patch within 12 hours or as directed by MD 60 patch 6    .  Misc Natural Products (JOINT HEALTH PO) Take by mouth daily.    . montelukast (SINGULAIR) 10 MG tablet Take 1 tablet (10 mg total) by mouth at bedtime. 90 tablet 3  . Multiple Vitamins-Minerals (MULTIVITAMIN WITH MINERALS) tablet Take 1 tablet by mouth daily.    Marland Kitchen NIACINAMIDE PO Take 1 capsule by mouth daily.    Marland Kitchen omeprazole (PRILOSEC) 20 MG capsule Take 1 capsule (20 mg total) by mouth daily. 90 capsule 3  . predniSONE (DELTASONE) 50 MG tablet Take 1 tablet (50 mg total) by mouth daily with breakfast. 5 tablet 0  . Pseudoephedrine HCl (SUDAFED PO) Take by mouth.    . tiotropium (SPIRIVA HANDIHALER) 18 MCG inhalation capsule PLACE 1 CAPSULE INTO INHALER AND INHALE THE CONTENTS DAILY 90 capsule 3  . traMADol (ULTRAM) 50 MG tablet Take by mouth every 6 (six) hours as needed.    Marland Kitchen UNABLE TO FIND Take 1 tablet by mouth 2 (two) times daily. Cardio FX    . UNABLE TO FIND Take 1 tablet by mouth daily. Cinnergy     No current facility-administered medications on file prior to visit.    There are no Patient Instructions on file for this visit. No follow-ups on file.   Kris Hartmann, NP

## 2020-01-27 NOTE — Telephone Encounter (Signed)
I attempted to contact the patient and a message was left for a return call. 

## 2020-01-29 ENCOUNTER — Telehealth (INDEPENDENT_AMBULATORY_CARE_PROVIDER_SITE_OTHER): Payer: Self-pay

## 2020-01-29 NOTE — Telephone Encounter (Signed)
Spoke with the patient and she is now scheduled with Dr. Lucky Cowboy for a LLE angio on 02/03/20 wit ha 9:00 am arrival time to the [facility]. Patient will do covid testing on 01/30/20 between 8-1 pm at the [facility]. Pre-procedure instructions were discussed and will be mailed.

## 2020-01-30 ENCOUNTER — Encounter: Payer: Self-pay | Admitting: Student in an Organized Health Care Education/Training Program

## 2020-01-30 ENCOUNTER — Ambulatory Visit (HOSPITAL_BASED_OUTPATIENT_CLINIC_OR_DEPARTMENT_OTHER): Payer: Medicare Other | Admitting: Student in an Organized Health Care Education/Training Program

## 2020-01-30 ENCOUNTER — Other Ambulatory Visit: Payer: Self-pay

## 2020-01-30 ENCOUNTER — Other Ambulatory Visit
Admission: RE | Admit: 2020-01-30 | Discharge: 2020-01-30 | Disposition: A | Discharge status: Home / Self Care | Payer: Medicare Other | Source: Ambulatory Visit | Attending: Vascular Surgery | Admitting: Vascular Surgery

## 2020-01-30 VITALS — BP 146/88 | HR 128 | Temp 98.2°F | Resp 16 | Ht 61.0 in | Wt 120.0 lb

## 2020-01-30 DIAGNOSIS — Z8673 Personal history of transient ischemic attack (TIA), and cerebral infarction without residual deficits: Secondary | ICD-10-CM | POA: Insufficient documentation

## 2020-01-30 DIAGNOSIS — M47814 Spondylosis without myelopathy or radiculopathy, thoracic region: Secondary | ICD-10-CM | POA: Diagnosis not present

## 2020-01-30 DIAGNOSIS — Z20822 Contact with and (suspected) exposure to covid-19: Secondary | ICD-10-CM | POA: Insufficient documentation

## 2020-01-30 DIAGNOSIS — M47816 Spondylosis without myelopathy or radiculopathy, lumbar region: Secondary | ICD-10-CM | POA: Insufficient documentation

## 2020-01-30 DIAGNOSIS — Z79891 Long term (current) use of opiate analgesic: Secondary | ICD-10-CM | POA: Insufficient documentation

## 2020-01-30 DIAGNOSIS — M5136 Other intervertebral disc degeneration, lumbar region: Secondary | ICD-10-CM

## 2020-01-30 DIAGNOSIS — I739 Peripheral vascular disease, unspecified: Secondary | ICD-10-CM | POA: Diagnosis not present

## 2020-01-30 DIAGNOSIS — M25552 Pain in left hip: Secondary | ICD-10-CM | POA: Diagnosis not present

## 2020-01-30 DIAGNOSIS — K219 Gastro-esophageal reflux disease without esophagitis: Secondary | ICD-10-CM | POA: Insufficient documentation

## 2020-01-30 DIAGNOSIS — M4120 Other idiopathic scoliosis, site unspecified: Secondary | ICD-10-CM | POA: Diagnosis not present

## 2020-01-30 DIAGNOSIS — Z886 Allergy status to analgesic agent status: Secondary | ICD-10-CM | POA: Insufficient documentation

## 2020-01-30 DIAGNOSIS — M48062 Spinal stenosis, lumbar region with neurogenic claudication: Secondary | ICD-10-CM

## 2020-01-30 DIAGNOSIS — G894 Chronic pain syndrome: Secondary | ICD-10-CM | POA: Diagnosis not present

## 2020-01-30 DIAGNOSIS — M25512 Pain in left shoulder: Secondary | ICD-10-CM | POA: Diagnosis not present

## 2020-01-30 DIAGNOSIS — F17210 Nicotine dependence, cigarettes, uncomplicated: Secondary | ICD-10-CM | POA: Insufficient documentation

## 2020-01-30 DIAGNOSIS — Z885 Allergy status to narcotic agent status: Secondary | ICD-10-CM | POA: Insufficient documentation

## 2020-01-30 DIAGNOSIS — Z8249 Family history of ischemic heart disease and other diseases of the circulatory system: Secondary | ICD-10-CM | POA: Insufficient documentation

## 2020-01-30 DIAGNOSIS — F1190 Opioid use, unspecified, uncomplicated: Secondary | ICD-10-CM

## 2020-01-30 DIAGNOSIS — I10 Essential (primary) hypertension: Secondary | ICD-10-CM | POA: Diagnosis not present

## 2020-01-30 DIAGNOSIS — Z880 Allergy status to penicillin: Secondary | ICD-10-CM | POA: Diagnosis not present

## 2020-01-30 DIAGNOSIS — H5461 Unqualified visual loss, right eye, normal vision left eye: Secondary | ICD-10-CM | POA: Diagnosis not present

## 2020-01-30 DIAGNOSIS — M25551 Pain in right hip: Secondary | ICD-10-CM | POA: Diagnosis not present

## 2020-01-30 DIAGNOSIS — Z882 Allergy status to sulfonamides status: Secondary | ICD-10-CM | POA: Insufficient documentation

## 2020-01-30 DIAGNOSIS — Z881 Allergy status to other antibiotic agents status: Secondary | ICD-10-CM | POA: Insufficient documentation

## 2020-01-30 DIAGNOSIS — J449 Chronic obstructive pulmonary disease, unspecified: Secondary | ICD-10-CM | POA: Insufficient documentation

## 2020-01-30 DIAGNOSIS — F419 Anxiety disorder, unspecified: Secondary | ICD-10-CM | POA: Insufficient documentation

## 2020-01-30 DIAGNOSIS — Z888 Allergy status to other drugs, medicaments and biological substances status: Secondary | ICD-10-CM | POA: Insufficient documentation

## 2020-01-30 LAB — SARS CORONAVIRUS 2 (TAT 6-24 HRS): SARS Coronavirus 2: NEGATIVE

## 2020-01-30 NOTE — Patient Instructions (Signed)
1. Recommend ES APAP 500 mg (1-2 tablets) up to three times a day, not to exceed a daily dose of 3 g 2. Recommend alpha lipoic acid [phone] mg daily for neuropathic pain of b/l feet. 3. D/C [place].

## 2020-01-30 NOTE — Progress Notes (Signed)
PROVIDER NOTE: Information contained herein reflects review and annotations entered in association with encounter. Interpretation of such information and data should be left to medically-trained personnel. Information provided to patient can be located elsewhere in the medical record under "Patient Instructions". Document created using STT-dictation technology, any transcriptional errors that may result from process are unintentional.    Patient: Savannah Mcclure  Service Category: E/M  Provider: Gillis Santa, MD  DOB: Feb 10, 1941  DOS: 01/30/2020  Specialty: Interventional Pain Management  MRN: [id number]  Setting: Ambulatory outpatient  PCP: [name], DO  Type: Established Patient    Referring Provider: [name], DO  Location: Office  Delivery: Face-to-face     HPI  Reason for encounter: Savannah Mcclure, a 79 y.o. year old female, is here today for evaluation and management of her Chronic pain syndrome [G89.4]. Savannah Mcclure's primary complain today is Back Pain (entire back ) Last encounter: Practice (01/10/2020). My last encounter with her was on 01/09/2020. Pertinent problems: Savannah Mcclure has HTN (hypertension); COPD (chronic obstructive pulmonary disease) ([place]); Scoliosis (and kyphoscoliosis), idiopathic; Spinal stenosis of lumbar region with neurogenic claudication; PAD (peripheral artery disease) ([place]); Chronic left shoulder pain; Pain of both hip joints; Chronic pain syndrome; Spondylosis without myelopathy or radiculopathy, lumbar region; and Thoracic spondylosis without myelopathy on their pertinent problem list. Pain Assessment: Severity of Chronic pain is reported as a 3 /10. Location: Back Upper, Mid, Lower/into legs. Onset: More than a month ago. Quality: Constant, Discomfort, Sharp, Aching. Timing: Constant. Modifying factor(s): patches are helping a little bit.  make her very loopy and affects her balance.. Vitals:  height is '5\' 1"'  (1.549 m) and  weight is 120 lb (54.4 kg). Her temporal temperature is 98.2 F (36.8 C). Her blood pressure is 146/88 (abnormal) and her pulse is 128 (abnormal). Her respiration is 16 and oxygen saturation is 100%.   Patient follows up today for medication management.  Unfortunately buprenorphine was not beneficial for her.  She actually had side effects of dizziness and balance abnormality.  She did not receive significant analgesic benefit from buprenorphine.  I recommend that she discontinue this medication.  She states that Dr. Wynetta Emery prescribed her a steroid taper that was very beneficial for her pain.  Unfortunately, do not recommend chronic steroid therapy for her condition.  Discussed utilizing acetaminophen and alpha lipoic acid for her pain condition.  ROS  Constitutional: Denies any fever or chills Gastrointestinal: No reported hemesis, hematochezia, vomiting, or acute GI distress Musculoskeletal: Denies any acute onset joint swelling, redness, loss of ROM, or weakness Neurological: No reported episodes of acute onset apraxia, aphasia, dysarthria, agnosia, amnesia, paralysis, loss of coordination, or loss of consciousness  Medication Review  B Complex-C, Difluprednate, Misc Natural Products, Niacinamide, UNABLE TO FIND, benazepril, cilostazol, cyclobenzaprine, diphenhydrAMINE, gabapentin, hydrOXYzine, lidocaine, montelukast, multivitamin with minerals, omeprazole, and tiotropium  History Review  Allergy: Savannah Mcclure is allergic to tramadol, other, oxycodone, amlodipine, augmentin [amoxicillin-pot clavulanate], azithromycin, contrast media [iodinated diagnostic agents], doxycycline, erythromycin, hydrocodone, lactose intolerance (gi), minocycline, nsaids, relafen [nabumetone], sulfa antibiotics, and tetracyclines & related. Drug: Savannah Mcclure  reports no history of drug use. Alcohol:  reports current alcohol use. Tobacco:  reports that she has been smoking cigarettes. She has a 15.00  pack-year smoking history. She has never used smokeless tobacco. Social: Savannah Mcclure  reports that she has been smoking cigarettes. She has a 15.00 pack-year smoking history. She has never used smokeless tobacco. She reports current alcohol use. She reports that she  does not use drugs. Medical:  has a past medical history of Aneurysm ([place]), Anxiety, Blind right eye, Cardiac arrest ([place]), Cataract, Claudication ([place]), Complication of anesthesia (03/15/2012), COPD (chronic obstructive pulmonary disease) ([place]), Dyspnea, GERD (gastroesophageal reflux disease), Heart murmur, Hypertension, Leaky heart valve, Oxygen deficit, Peripheral vascular disease ([place]), Scoliosis, Sepsis ([place]), Spinal stenosis, Stroke ([place]), and Tachycardia. Surgical: Savannah Mcclure  has a past surgical history that includes right eye surgery; Wisdom tooth extraction; Back surgery; Abdominal hysterectomy; Breast lumpectomy (Left); Lower Extremity Angiography (Right, 06/20/2018); and Cataract extraction w/PHACO (Left, 08/14/2019). Family: family history includes Heart disease in her paternal grandfather and paternal grandmother; Heart failure in her father; Hypertension in her mother; Intracerebral hemorrhage in her mother; Kidney disease in her maternal grandmother; Rheum arthritis in her maternal grandmother; Schizophrenia in her daughter; Stroke in her paternal grandfather and paternal grandmother.  Laboratory Chemistry Profile   Renal Lab Results  Component Value Date   BUN 10 04/25/2019   CREATININE 0.82 04/25/2019   BCR 12 04/25/2019   GFRAA 79 04/25/2019   GFRNONAA 69 04/25/2019     Hepatic Lab Results  Component Value Date   AST 15 04/25/2019   ALT 11 04/25/2019   ALBUMIN 4.7 04/25/2019   ALKPHOS 80 04/25/2019     Electrolytes Lab Results  Component Value Date   NA 142 04/25/2019   K 4.6 04/25/2019   CL 101 04/25/2019   CALCIUM 9.7 04/25/2019   MG 1.8 10/13/2017   PHOS 3.2 10/13/2017     Bone No  results found for: VD25OH, VD125OH2TOT, PI9518AC1, YS0630ZS0, 25OHVITD1, 25OHVITD2, 25OHVITD3, TESTOFREE, TESTOSTERONE   Inflammation (CRP: Acute Phase) (ESR: Chronic Phase) Lab Results  Component Value Date   LATICACIDVEN 1.8 10/09/2017       Note: Above Lab results reviewed.  Recent Imaging Review  VAS US CAROTID Carotid Arterial Duplex Study  Indications:       Carotid artery disease. Comparison Study:  07/19/2019; Compared to prior study on 07/19/2019 the Left                    ICA appears to have decreased from 60-79% to 40-59% stenosis                    today.  Performing Technologist: Almira Coaster RVS    Examination Guidelines: A complete evaluation includes B-mode imaging, spectral Doppler, color Doppler, and power Doppler as needed of all accessible portions of each vessel. Bilateral testing is considered an integral part of a complete examination. Limited examinations for reoccurring indications may be performed as noted.    Right Carotid Findings: +----------+--------+--------+--------+------------------+--------+             PSV cm/s EDV cm/s Stenosis Plaque Description Comments  +----------+--------+--------+--------+------------------+--------+  CCA Prox   91       16                                             +----------+--------+--------+--------+------------------+--------+  CCA Mid    93       16                                             +----------+--------+--------+--------+------------------+--------+  CCA Distal 79  18                                             +----------+--------+--------+--------+------------------+--------+  ICA Prox   153      29                                             +----------+--------+--------+--------+------------------+--------+  ICA Mid    137      27                                             +----------+--------+--------+--------+------------------+--------+  ICA Distal 163      25                                              +----------+--------+--------+--------+------------------+--------+  ECA        414      0                                              +----------+--------+--------+--------+------------------+--------+  +----------+--------+-------+--------+-------------------+             PSV cm/s EDV cms Describe Arm Pressure (mmHG)  +----------+--------+-------+--------+-------------------+  Subclavian 161      0                                     +----------+--------+-------+--------+-------------------+  +---------+--------+--+--------+--+  Vertebral PSV cm/s 81 EDV cm/s 18  +---------+--------+--+--------+--+     Left Carotid Findings: +----------+--------+--------+--------+------------------+--------+             PSV cm/s EDV cm/s Stenosis Plaque Description Comments  +----------+--------+--------+--------+------------------+--------+  CCA Prox   84       13                                             +----------+--------+--------+--------+------------------+--------+  CCA Mid    95       18                                             +----------+--------+--------+--------+------------------+--------+  CCA Distal 79       14                                             +----------+--------+--------+--------+------------------+--------+  ICA Prox   208      41                                             +----------+--------+--------+--------+------------------+--------+  ICA Mid    134      26                                             +----------+--------+--------+--------+------------------+--------+  ICA Distal 132      32                                             +----------+--------+--------+--------+------------------+--------+  ECA        411      20                                             +----------+--------+--------+--------+------------------+--------+  +----------+--------+--------+--------+-------------------+             PSV cm/s EDV  cm/s Describe Arm Pressure (mmHG)  +----------+--------+--------+--------+-------------------+  Subclavian 81       0                                      +----------+--------+--------+--------+-------------------+  +---------+--------+--+--------+--+  Vertebral PSV cm/s 34 EDV cm/s 15  +---------+--------+--+--------+--+        Summary: Right Carotid: Velocities in the right ICA are consistent with a 40-59%                stenosis. The ECA appears >50% stenosed.  Left Carotid: Velocities in the left ICA are consistent with a 40-59% stenosis.               The ECA appears >50% stenosed.  Vertebrals:  Bilateral vertebral arteries demonstrate antegrade flow. Subclavians: Normal flow hemodynamics were seen in bilateral subclavian              arteries.  *See table(s) above for measurements and observations.    Electronically signed by Leotis Pain MD on 01/28/2020 at 12:02:54 PM.      Final   VAS [place] ABI WITH/WO TBI LOWER EXTREMITY DOPPLER STUDY  Indications: Peripheral artery disease, and Aorta occlusion              Bilat CIA, EIA, SFA occlusions with bilat PFA's providing              collateral flow.   Vascular Interventions: Patient states a history of right leg angioplasty.  Comparison Study: 07/19/2019  Performing Technologist: Almira Coaster RVS    Examination Guidelines: A complete evaluation includes at minimum, Doppler waveform signals and systolic blood pressure reading at the level of bilateral brachial, anterior tibial, and posterior tibial arteries, when vessel segments are accessible. Bilateral testing is considered an integral part of a complete examination. Photoelectric Plethysmograph (PPG) waveforms and toe systolic pressure readings are included as required and additional duplex testing as needed. Limited examinations for reoccurring indications may be performed as noted.    ABI  Findings: +---------+------------------+-----+--------+--------+  Right     Rt Pressure (mmHg) Index Waveform Comment   +---------+------------------+-----+--------+--------+  Brachial  158                                         +---------+------------------+-----+--------+--------+  ATA       100                0.63                     +---------+------------------+-----+--------+--------+  PTA       107                0.68                     +---------+------------------+-----+--------+--------+  Great Toe 87                 0.55                     +---------+------------------+-----+--------+--------+  +---------+------------------+-----+--------+-------+  Left      Lt Pressure (mmHg) Index Waveform Comment  +---------+------------------+-----+--------+-------+  Brachial  124                                        +---------+------------------+-----+--------+-------+  ATA       100                0.63                    +---------+------------------+-----+--------+-------+  PTA       101                0.64                    +---------+------------------+-----+--------+-------+  Great Toe 75                 0.47                    +---------+------------------+-----+--------+-------+  +-------+-----------+-----------+------------+------------+  ABI/TBI Today's ABI Today's TBI Previous ABI Previous TBI  +-------+-----------+-----------+------------+------------+  Right   .68         .55         .66          .30           +-------+-----------+-----------+------------+------------+  Left    .64         .47         .68          .47           +-------+-----------+-----------+------------+------------+  Bilateral ABIs appear essentially unchanged compared to prior study on 07/19/2019. Compared to prior study on 07/19/2019. Rt TBIs appear increased compared to prior study on 07/19/2019.   Summary: Right: Resting right ankle-brachial index indicates moderate right lower extremity  arterial disease. The right toe-brachial index is abnormal.  Left: Resting left ankle-brachial index indicates moderate left lower extremity arterial disease. The left toe-brachial index is abnormal.    *See table(s) above for measurements and observations.    Electronically signed by Leotis Pain MD on 01/28/2020 at 12:02:51 PM.       Final   Note: Reviewed        Physical Exam  General appearance: Well nourished, well developed, and well hydrated. In no apparent acute distress Mental status: Alert, oriented x 3 (person, place, & time)       Respiratory: No evidence of acute respiratory distress Eyes: PERLA Vitals: BP (!) 146/88 (BP Location: Left Arm, Patient Position: Sitting, Cuff Size: Normal)    Pulse [place] Kitchen)  128    Temp 98.2 F (36.8 C) (Temporal)    Resp 16    Ht '5\' 1"'  (1.549 m)    Wt 120 lb (54.4 kg)    SpO2 100%    BMI 22.67 kg/m  BMI: Estimated body mass index is 22.67 kg/m as calculated from the following:   Height as of this encounter: '5\' 1"'  (1.549 m).   Weight as of this encounter: 120 lb (54.4 kg). Ideal: Ideal body weight: 47.8 kg (105 lb 6.1 oz) Adjusted ideal body weight: 50.5 kg (111 lb 3.7 oz)  Lumbar Spine Area Exam  Skin & Axial Inspection: Thoraco-lumbar Scoliosis Alignment: Scoliosis detected Functional ROM: Pain restricted ROM       Stability: No instability detected Muscle Tone/Strength: Functionally intact. No obvious neuro-muscular anomalies detected. Sensory (Neurological): Musculoskeletal pain pattern  Gait & Posture Assessment  Ambulation: Unassisted Gait: Relatively normal for age and body habitus Posture: WNL  Lower Extremity Exam    Side: Right lower extremity  Side: Left lower extremity  Stability: No instability observed          Stability: No instability observed          Skin & Extremity Inspection: Skin color, temperature, and hair growth are WNL. No peripheral edema or cyanosis. No masses, redness, swelling, asymmetry, or associated skin  lesions. No contractures.  Skin & Extremity Inspection: Skin color, temperature, and hair growth are WNL. No peripheral edema or cyanosis. No masses, redness, swelling, asymmetry, or associated skin lesions. No contractures.  Functional ROM: Pain restricted ROM for hip and knee joints          Functional ROM: Pain restricted ROM for hip and knee joints          Muscle Tone/Strength: Functionally intact. No obvious neuro-muscular anomalies detected.  Muscle Tone/Strength: Functionally intact. No obvious neuro-muscular anomalies detected.  Sensory (Neurological): Neurogenic pain pattern        Sensory (Neurological): Neurogenic pain pattern        DTR: Patellar: deferred today Achilles: deferred today Plantar: deferred today  DTR: Patellar: deferred today Achilles: deferred today Plantar: deferred today  Palpation: No palpable anomalies  Palpation: No palpable anomalies    Assessment   Status Diagnosis  Controlled Controlled Controlled 1. Chronic pain syndrome   2. Scoliosis (and kyphoscoliosis), idiopathic   3. Thoracic spondylosis without myelopathy   4. Spondylosis without myelopathy or radiculopathy, lumbar region   5. Lumbar degenerative disc disease   6. Chronic, continuous use of opioids   7. Spinal stenosis of lumbar region with neurogenic claudication      Updated Problems: Problem  Spondylosis Without Myelopathy Or Radiculopathy, Lumbar Region  Thoracic Spondylosis Without Myelopathy  Chronic Left Shoulder Pain  Pain of Both Hip Joints  Chronic Pain Syndrome  Scoliosis (And Kyphoscoliosis), Idiopathic  Pad (Peripheral Artery Disease) ([facility])  Htn (Hypertension)  Copd (Chronic Obstructive Pulmonary Disease) ([facility])  Spinal Stenosis of Lumbar Region With Neurogenic Claudication    Plan of Care    Unfortunately, we have exhausted various pharmacologic analgesic options.  For the time being, continue gabapentin as prescribed.  Recommend Tylenol [phone] mg 3 times daily  as needed, not to exceed 3 g daily.  Patient can also try alpha lipoic acid 600 to 1200 mg which is over-the-counter for her neuropathic pain.   Follow-up plan:   Return in about 3 months (around 05/01/2020) for Medication Management, in person.     Status post T11, T12, L1 RFA bilaterally on 03/04/2019,  04/2018     Recent Visits Date Type Provider Dept  01/02/20 Telemedicine Gillis Santa, MD [facility]  Showing recent visits within past 90 days and meeting all other requirements Today's Visits Date Type Provider Dept  01/30/20 Office Visit Gillis Santa, MD [facility]  Showing today's visits and meeting all other requirements Future Appointments Date Type Provider Dept  04/28/20 Appointment Gillis Santa, MD [facility]  Showing future appointments within next 90 days and meeting all other requirements  I discussed the assessment and treatment plan with the patient. The patient was provided an opportunity to ask questions and all were answered. The patient agreed with the plan and demonstrated an understanding of the instructions.  Patient advised to call back or seek an in-person evaluation if the symptoms or condition worsens.  Duration of encounter: 15 minutes.  Note by: Gillis Santa, MD Date: 01/30/2020; Time: 11:39 AM

## 2020-02-03 ENCOUNTER — Other Ambulatory Visit: Payer: Self-pay

## 2020-02-03 ENCOUNTER — Encounter: Payer: Self-pay | Admitting: Vascular Surgery

## 2020-02-03 ENCOUNTER — Other Ambulatory Visit (INDEPENDENT_AMBULATORY_CARE_PROVIDER_SITE_OTHER): Payer: Self-pay | Admitting: Nurse Practitioner

## 2020-02-03 ENCOUNTER — Encounter
Admission: RE | Disposition: A | Discharge status: Home / Self Care | Payer: Self-pay | Source: Home / Self Care | Attending: Vascular Surgery

## 2020-02-03 ENCOUNTER — Ambulatory Visit
Admission: RE | Admit: 2020-02-03 | Discharge: 2020-02-03 | Disposition: A | Discharge status: Home / Self Care | Payer: Medicare Other | Attending: Vascular Surgery | Admitting: Vascular Surgery

## 2020-02-03 DIAGNOSIS — F17210 Nicotine dependence, cigarettes, uncomplicated: Secondary | ICD-10-CM | POA: Insufficient documentation

## 2020-02-03 DIAGNOSIS — I70213 Atherosclerosis of native arteries of extremities with intermittent claudication, bilateral legs: Secondary | ICD-10-CM | POA: Insufficient documentation

## 2020-02-03 DIAGNOSIS — Z8673 Personal history of transient ischemic attack (TIA), and cerebral infarction without residual deficits: Secondary | ICD-10-CM | POA: Diagnosis not present

## 2020-02-03 DIAGNOSIS — Z8674 Personal history of sudden cardiac arrest: Secondary | ICD-10-CM | POA: Insufficient documentation

## 2020-02-03 DIAGNOSIS — M48062 Spinal stenosis, lumbar region with neurogenic claudication: Secondary | ICD-10-CM | POA: Diagnosis not present

## 2020-02-03 DIAGNOSIS — I70219 Atherosclerosis of native arteries of extremities with intermittent claudication, unspecified extremity: Secondary | ICD-10-CM

## 2020-02-03 DIAGNOSIS — Z888 Allergy status to other drugs, medicaments and biological substances status: Secondary | ICD-10-CM | POA: Diagnosis not present

## 2020-02-03 DIAGNOSIS — K219 Gastro-esophageal reflux disease without esophagitis: Secondary | ICD-10-CM | POA: Diagnosis not present

## 2020-02-03 DIAGNOSIS — Z79899 Other long term (current) drug therapy: Secondary | ICD-10-CM | POA: Insufficient documentation

## 2020-02-03 DIAGNOSIS — Z882 Allergy status to sulfonamides status: Secondary | ICD-10-CM | POA: Insufficient documentation

## 2020-02-03 DIAGNOSIS — Z9981 Dependence on supplemental oxygen: Secondary | ICD-10-CM | POA: Insufficient documentation

## 2020-02-03 DIAGNOSIS — J449 Chronic obstructive pulmonary disease, unspecified: Secondary | ICD-10-CM | POA: Diagnosis not present

## 2020-02-03 DIAGNOSIS — I10 Essential (primary) hypertension: Secondary | ICD-10-CM | POA: Insufficient documentation

## 2020-02-03 DIAGNOSIS — Z881 Allergy status to other antibiotic agents status: Secondary | ICD-10-CM | POA: Diagnosis not present

## 2020-02-03 DIAGNOSIS — I70223 Atherosclerosis of native arteries of extremities with rest pain, bilateral legs: Secondary | ICD-10-CM | POA: Diagnosis not present

## 2020-02-03 DIAGNOSIS — I6523 Occlusion and stenosis of bilateral carotid arteries: Secondary | ICD-10-CM | POA: Insufficient documentation

## 2020-02-03 DIAGNOSIS — F419 Anxiety disorder, unspecified: Secondary | ICD-10-CM | POA: Insufficient documentation

## 2020-02-03 HISTORY — PX: LOWER EXTREMITY ANGIOGRAPHY: CATH118251

## 2020-02-03 LAB — CREATININE, SERUM
Creatinine, Ser: 0.76 mg/dL (ref 0.44–1.00)
GFR calc Af Amer: >60 mL/min (ref >60)
GFR calc non Af Amer: >60 mL/min (ref >60)

## 2020-02-03 LAB — BUN: BUN: 11 mg/dL (ref 8–23)

## 2020-02-03 SURGERY — LOWER EXTREMITY ANGIOGRAPHY
Anesthesia: Moderate Sedation | Laterality: Left

## 2020-02-03 MED ORDER — SODIUM CHLORIDE 0.9 % IV SOLN: INTRAVENOUS | Status: DC

## 2020-02-03 MED ORDER — SODIUM CHLORIDE 0.9% FLUSH: 3.0000 mL | Freq: Two times a day (BID) | INTRAVENOUS | Status: DC

## 2020-02-03 MED ORDER — ONDANSETRON HCL 4 MG/2ML IJ SOLN: 4.0000 mg | Freq: Four times a day (QID) | INTRAMUSCULAR | Status: DC | PRN

## 2020-02-03 MED ORDER — FENTANYL CITRATE (PF) 100 MCG/2ML IJ SOLN
INTRAMUSCULAR | Status: DC | PRN
  Administered 2020-02-03 (×2): 25 ug via INTRAVENOUS
  Administered 2020-02-03: 50 ug via INTRAVENOUS

## 2020-02-03 MED ORDER — MIDAZOLAM HCL 2 MG/ML PO SYRP: 8.0000 mg | ORAL_SOLUTION | Freq: Once | ORAL | Status: DC | PRN

## 2020-02-03 MED ORDER — LABETALOL HCL 5 MG/ML IV SOLN: 10.0000 mg | INTRAVENOUS | Status: DC | PRN

## 2020-02-03 MED ORDER — HYDRALAZINE HCL 20 MG/ML IJ SOLN: 5.0000 mg | INTRAMUSCULAR | Status: DC | PRN

## 2020-02-03 MED ORDER — METHYLPREDNISOLONE SODIUM SUCC 125 MG IJ SOLR
INTRAMUSCULAR | Status: AC
  Administered 2020-02-03: 125 mg via INTRAVENOUS
  Filled 2020-02-03: qty 2

## 2020-02-03 MED ORDER — CLOPIDOGREL BISULFATE 75 MG PO TABS: 75.0000 mg | ORAL_TABLET | Freq: Every day | ORAL | 11 refills | Status: DC

## 2020-02-03 MED ORDER — FAMOTIDINE 20 MG PO TABS: 40.0000 mg | ORAL_TABLET | Freq: Once | ORAL | Status: AC | PRN

## 2020-02-03 MED ORDER — CEFAZOLIN SODIUM-DEXTROSE 2-4 GM/100ML-% IV SOLN
2.0000 g | Freq: Once | INTRAVENOUS | Status: AC
  Administered 2020-02-03: 2 g via INTRAVENOUS

## 2020-02-03 MED ORDER — MIDAZOLAM HCL 5 MG/5ML IJ SOLN
INTRAMUSCULAR | Status: AC
  Filled 2020-02-03: qty 5

## 2020-02-03 MED ORDER — CLOPIDOGREL BISULFATE 75 MG PO TABS: 75.0000 mg | ORAL_TABLET | Freq: Every day | ORAL | Status: DC

## 2020-02-03 MED ORDER — HEPARIN SODIUM (PORCINE) 1000 UNIT/ML IJ SOLN
INTRAMUSCULAR | Status: DC | PRN
  Administered 2020-02-03: 4000 [IU] via INTRAVENOUS

## 2020-02-03 MED ORDER — ACETAMINOPHEN 325 MG PO TABS: 650.0000 mg | ORAL_TABLET | ORAL | Status: DC | PRN

## 2020-02-03 MED ORDER — FENTANYL CITRATE (PF) 100 MCG/2ML IJ SOLN
INTRAMUSCULAR | Status: AC
  Filled 2020-02-03: qty 2

## 2020-02-03 MED ORDER — SODIUM CHLORIDE 0.9% FLUSH: 3.0000 mL | INTRAVENOUS | Status: DC | PRN

## 2020-02-03 MED ORDER — ATORVASTATIN CALCIUM 10 MG PO TABS
10.0000 mg | ORAL_TABLET | Freq: Every day | ORAL | Status: DC
  Filled 2020-02-03: qty 1

## 2020-02-03 MED ORDER — SODIUM CHLORIDE 0.9 % IV SOLN: 250.0000 mL | INTRAVENOUS | Status: DC | PRN

## 2020-02-03 MED ORDER — HEPARIN SODIUM (PORCINE) 1000 UNIT/ML IJ SOLN
INTRAMUSCULAR | Status: AC
  Filled 2020-02-03: qty 1

## 2020-02-03 MED ORDER — MIDAZOLAM HCL 2 MG/2ML IJ SOLN
INTRAMUSCULAR | Status: DC | PRN
  Administered 2020-02-03: 2 mg via INTRAVENOUS
  Administered 2020-02-03 (×2): 1 mg via INTRAVENOUS

## 2020-02-03 MED ORDER — ATORVASTATIN CALCIUM 10 MG PO TABS
10.0000 mg | ORAL_TABLET | Freq: Every day | ORAL | 11 refills | Status: DC
Start: 2020-02-03 — End: 2020-08-05

## 2020-02-03 MED ORDER — METHYLPREDNISOLONE SODIUM SUCC 125 MG IJ SOLR: 125.0000 mg | Freq: Once | INTRAMUSCULAR | Status: AC | PRN

## 2020-02-03 MED ORDER — DIPHENHYDRAMINE HCL 50 MG/ML IJ SOLN
INTRAMUSCULAR | Status: AC
  Administered 2020-02-03: 50 mg via INTRAVENOUS
  Filled 2020-02-03: qty 1

## 2020-02-03 MED ORDER — FAMOTIDINE 20 MG PO TABS
ORAL_TABLET | ORAL | Status: AC
  Administered 2020-02-03: 40 mg via ORAL
  Filled 2020-02-03: qty 2

## 2020-02-03 MED ORDER — DIPHENHYDRAMINE HCL 50 MG/ML IJ SOLN: 50.0000 mg | Freq: Once | INTRAMUSCULAR | Status: AC | PRN

## 2020-02-03 MED ORDER — FENTANYL CITRATE (PF) 100 MCG/2ML IJ SOLN: 12.5000 ug | Freq: Once | INTRAMUSCULAR | Status: DC | PRN

## 2020-02-03 SURGICAL SUPPLY — 38 items
BALLN LUTONIX AV 6X60X75 (BALLOONS) ×3
BALLN LUTONIX DCB 5X100X130 (BALLOONS) ×3
BALLN ULTRVRSE 4X100X75C (BALLOONS) ×3
BALLN ULTRVRSE 4X150X150 (BALLOONS) ×3
BALLN ULTRVRSE 5X60X75C (BALLOONS) ×3
BALLN ULTRVRSE 7X40X75C (BALLOONS) ×3
BALLOON LUTONIX AV 6X60X75 (BALLOONS) IMPLANT
BALLOON LUTONIX DCB 5X100X130 (BALLOONS) IMPLANT
BALLOON ULTRVRSE 4X100X75C (BALLOONS) IMPLANT
BALLOON ULTRVRSE 4X150X150 (BALLOONS) IMPLANT
BALLOON ULTRVRSE 5X60X75C (BALLOONS) IMPLANT
BALLOON ULTRVRSE 7X40X75C (BALLOONS) IMPLANT
CANNULA 5F STIFF (CANNULA) ×2 IMPLANT
CATH BEACON 5 .035 65 KMP TIP (CATHETERS) ×2 IMPLANT
CATH BEACON 5 .035 65 RIM TIP (CATHETERS) ×2 IMPLANT
CATH CXI SUPP ST 4FR 90CM (CATHETERS) ×2 IMPLANT
CATH MICROCATH PRGRT 2.8F 110 (CATHETERS) IMPLANT
CATH PIG 70CM (CATHETERS) ×2 IMPLANT
CATH VS15FR (CATHETERS) ×2 IMPLANT
DEVICE PRESTO INFLATION (MISCELLANEOUS) ×2 IMPLANT
DEVICE STARCLOSE SE CLOSURE (Vascular Products) ×2 IMPLANT
GLIDECATH 4FR STR (CATHETERS) ×2 IMPLANT
GLIDEWIRE ADV .035X180CM (WIRE) ×2 IMPLANT
HANDLE DETACHMENT COIL (MISCELLANEOUS) IMPLANT
MICROCATH PROGREAT 2.8F 110 CM (CATHETERS) ×3
PACK ANGIOGRAPHY (CUSTOM PROCEDURE TRAY) ×2 IMPLANT
SHEATH ANL2 6FRX45 HC (SHEATH) ×2 IMPLANT
SHEATH BRITE TIP 5FRX11 (SHEATH) ×2 IMPLANT
SHEATH HALO 035 6FRX10 (SHEATH) ×2 IMPLANT
STENT LIFESTAR 8X80X80 (Permanent Stent) ×2 IMPLANT
STENT LIFESTREAM 6X37X80 (Permanent Stent) ×2 IMPLANT
STENT LIFESTREAM 7X16X80 (Permanent Stent) IMPLANT
STENT LIFESTREAM 7X26X80 (Permanent Stent) ×2 IMPLANT
SYR MEDRAD MARK 7 150ML (SYRINGE) ×2 IMPLANT
TUBING CONTRAST HIGH PRESS 72 (TUBING) ×2 IMPLANT
WIRE G 018X200 V18 (WIRE) ×2 IMPLANT
WIRE G V18X300CM (WIRE) ×2 IMPLANT
WIRE J 3MM .035X145CM (WIRE) ×4 IMPLANT

## 2020-02-03 NOTE — Progress Notes (Signed)
Dr. Dew at bedside, speaking with pt. Re: procedural results. Pt. Verbalized understanding of conversation.  

## 2020-02-03 NOTE — H&P (Signed)
[facility] History & Physical Update  The patient was interviewed and re-examined.  The patient's previous History and Physical has been reviewed and is unchanged.  There is no change in the plan of care. We plan to proceed with the scheduled procedure.  Leotis Pain, MD  02/03/2020, 8:29 AM

## 2020-02-03 NOTE — Op Note (Signed)
[facility]  Percutaneous Study/Intervention Procedural Note   Date of Surgery: 02/03/2020  Surgeon(s):De Jaworski    Assistants:none  Pre-operative Diagnosis: PAD with claudication and early rest pain in the bilateral lower extremities  Post-operative diagnosis:  Same  Procedure(s) Performed:             1.  Ultrasound guidance for vascular access right femoral artery             2.  Catheter placement into left profunda femoris artery from right femoral approach             3.  Aortogram and selective left lower extremity angiogram             4.  Percutaneous transluminal angioplasty of left common femoral artery with 5 mm diameter angioplasty balloon             5.  Percutaneous transluminal angioplasty of left external and common iliac arteries with 4 mm diameter angioplasty balloon and 5 mm diameter angioplasty balloon  6.  Lifestream stent placement to the left common iliac artery with 6 mm diameter by 37 mm length stent postdilated with a 7 mm balloon  7.  Percutaneous transluminal angioplasty of the right common and external iliac arteries with a 5 mm diameter angioplasty balloon.  8.  Life star stent placement to the right external iliac artery with 8 mm diameter by 8 cm length stent  9.  Lifestream stent placement to the right common iliac artery with 7 mm diameter by 26 mm length stent             10.  StarClose closure device right femoral artery  EBL: 10 cc  Contrast: 100 cc  Fluoro Time: 19.1 minutes  Moderate Conscious Sedation Time: approximately 65 minutes using 4 mg of Versed and 100 mcg of Fentanyl              Indications:  Patient is a 79 y.o.female with a long history of peripheral arterial disease with disabling claudication symptoms and now some signs of early rest pain. The patient has noninvasive study showing ABIs in the 0.6 range bilaterally. The patient is brought in for angiography for further evaluation and potential treatment.   Due to the limb threatening nature of the situation, angiogram was performed for attempted limb salvage. The patient is aware that if the procedure fails, amputation would be expected.  The patient also understands that even with successful revascularization, amputation may still be required due to the severity of the situation.  Risks and benefits are discussed and informed consent is obtained.   Procedure:  The patient was identified and appropriate procedural time out was performed.  The patient was then placed supine on the table and prepped and draped in the usual sterile fashion. Moderate conscious sedation was administered during a face to face encounter with the patient throughout the procedure with my supervision of the RN administering medicines and monitoring the patient's vital signs, pulse oximetry, telemetry and mental status throughout from the start of the procedure until the patient was taken to the recovery room. Ultrasound was used to evaluate the right common femoral artery.  It was patent but small and diseased.  A digital ultrasound image was acquired.  A Seldinger needle was used to access the right common femoral artery under direct ultrasound guidance and a permanent image was performed.  A 0.035 J wire was advanced without resistance and a 5Fr sheath was placed.  Pigtail  catheter was placed into the aorta and an AP aortogram was performed. This demonstrated normal renal arteries with a heavily calcified aorta and iliac arteries.  The aorta was not stenotic were clearly aneurysmal.  The right common iliac artery had about a 70% stenosis with the external iliac artery having a nearly occlusive 90 to 95% stenosis tracking down to just above the femoral head.  The right common femoral artery had at least moderate disease as well.  The left common iliac artery was occluded approximately 1 cm beyond its origin with reconstitution in the left common femoral artery which was diseased. I then  crossed the aortic bifurcation and advanced to the left femoral head.  This was done tediously with first a rim catheter and then a V S1 catheter to get purchase in the left common iliac occlusion and then used the advantage wire to get down into the femoral artery.  I then went up and over with a CXI catheter into the left common femoral artery.  Selective left lower extremity angiogram was then performed. This demonstrated chronic occlusion of the left SFA.  The profunda femoris was large and patent.  The common femoral artery had at least moderate disease in the proximal to mid segment in the 60 to 70% range.  There was reconstitution of the above-knee popliteal artery with what appeared to be three-vessel runoff distally although it was very sluggish due to the poor inflow. It was felt that it was in the patient's best interest to proceed with intervention after these images to avoid a second procedure and a larger amount of contrast and fluoroscopy based off of the findings from the initial angiogram. The patient was systemically heparinized and I started by performing angioplasty to the left common and external iliac arteries with 4 mm diameter and then 5 mm diameter balloons.  I then advanced down and treated the left common femoral artery with a 5 mm diameter by 6 cm length angioplasty balloon inflated to 8 atm for 1 minute.  The right common and external iliac artery were treated with 5 mm diameter by 10 cm length angioplasty balloon inflated to 10 atm for 1 minute.  There is still significant residual disease present bilaterally.  On the left, there remained a greater than 80% residual stenosis in the left common iliac artery.  The left external iliac artery had about a 30 to 40% stenosis.  The left common femoral artery had stenosis in the 40 to 50% range, but this is in the location where stent was not possible.  The right common and external iliac artery had greater than 50% residual stenosis.  A 6  French Ansell sheath was then placed over the [employer] wire. I then addressed the left common iliac artery with a 6 mm diameter by 37 mm length lifestream stent and then postdilated this with a 7 mm balloon with less than 10% residual stenosis in this location.  I then turned my attention to the right iliacs.  The right external iliac artery to about 2 to 3 cm above the femoral head up to the iliac bifurcation was treated with an 8 mm diameter by 8 cm length life star stent postdilated with 6 mm balloon.  The right common iliac artery was treated with a 7 mm diameter by 26 mm length lifestream stent inflated to 12 atm.  Completion imaging showed less than 20% residual stenosis in the right common and external iliac arteries.  There is still some disease  in the proximal right common femoral artery but this would not be amenable to endovascular therapy and would require femoral endarterectomy if she remains symptomatic.  The left side may also require this in the future as well. I elected to terminate the procedure. The sheath was removed and StarClose closure device was deployed in the right femoral artery with excellent hemostatic result. The patient was taken to the recovery room in stable condition having tolerated the procedure well.  Findings:               Aortogram:  This demonstrated normal renal arteries with a heavily calcified aorta and iliac arteries.  The aorta was not stenotic were clearly aneurysmal.  The right common iliac artery had about a 70% stenosis with the external iliac artery having a nearly occlusive 90 to 95% stenosis tracking down to just above the femoral head.  The right common femoral artery had at least moderate disease as well.  The left common iliac artery was occluded approximately 1 cm beyond its origin with reconstitution in the left common femoral artery which was diseased             Right Lower Extremity:  This demonstrated chronic occlusion of the left SFA.  The  profunda femoris was large and patent.  The common femoral artery had at least moderate disease in the proximal to mid segment in the 60 to 70% range.  There was reconstitution of the above-knee popliteal artery with what appeared to be three-vessel runoff distally although it was very sluggish due to the poor inflow   Disposition: Patient was taken to the recovery room in stable condition having tolerated the procedure well.  Complications: None  Leotis Pain 02/03/2020 12:27 PM   This note was created with Dragon Medical transcription system. Any errors in dictation are purely unintentional.

## 2020-02-04 ENCOUNTER — Encounter: Payer: Self-pay | Admitting: Vascular Surgery

## 2020-02-06 ENCOUNTER — Ambulatory Visit: Payer: Self-pay

## 2020-02-06 NOTE — Telephone Encounter (Signed)
Patient called and says she had angioplasty on Monday and the procedure was done through her left groin. She says since Monday, she has extensive bruising from the groin down the left thigh to her knee. She says it feels hot and it feels like edema, swollen. She asked if this is normal. I explained it's normal to have bruising, but the surgeon's office is the best place to receive advice from about this issue. She says she's tried calling but only receives a message about a new patient. I placed her on hold and found the front office number, called and was placed on hold for 10 minutes, then a recording to leave a message, call 911 or call [facility] for the vascular surgeon on call if it's not an emergency. I advised the patient and she says she will call the hospital and if needed she will go to the ED at [facility].

## 2020-02-06 NOTE — Telephone Encounter (Signed)
LVM for pt to call back to schedule appt

## 2020-02-18 ENCOUNTER — Other Ambulatory Visit (INDEPENDENT_AMBULATORY_CARE_PROVIDER_SITE_OTHER): Payer: Self-pay | Admitting: Vascular Surgery

## 2020-02-18 DIAGNOSIS — Z95820 Peripheral vascular angioplasty status with implants and grafts: Secondary | ICD-10-CM

## 2020-02-18 DIAGNOSIS — I70213 Atherosclerosis of native arteries of extremities with intermittent claudication, bilateral legs: Secondary | ICD-10-CM

## 2020-02-21 NOTE — Telephone Encounter (Signed)
Letter generated to send to pt.

## 2020-02-25 ENCOUNTER — Other Ambulatory Visit: Payer: Self-pay

## 2020-02-25 ENCOUNTER — Ambulatory Visit (INDEPENDENT_AMBULATORY_CARE_PROVIDER_SITE_OTHER): Payer: Medicare Other | Admitting: Vascular Surgery

## 2020-02-25 ENCOUNTER — Encounter (INDEPENDENT_AMBULATORY_CARE_PROVIDER_SITE_OTHER): Payer: Self-pay | Admitting: Vascular Surgery

## 2020-02-25 ENCOUNTER — Ambulatory Visit (INDEPENDENT_AMBULATORY_CARE_PROVIDER_SITE_OTHER): Payer: Medicare Other

## 2020-02-25 VITALS — BP 149/79 | HR 134 | Ht 61.0 in | Wt 120.0 lb

## 2020-02-25 DIAGNOSIS — I10 Essential (primary) hypertension: Secondary | ICD-10-CM

## 2020-02-25 DIAGNOSIS — I70213 Atherosclerosis of native arteries of extremities with intermittent claudication, bilateral legs: Secondary | ICD-10-CM

## 2020-02-25 DIAGNOSIS — Z95820 Peripheral vascular angioplasty status with implants and grafts: Secondary | ICD-10-CM

## 2020-02-25 DIAGNOSIS — IMO0002 Reserved for concepts with insufficient information to code with codable children: Secondary | ICD-10-CM | POA: Insufficient documentation

## 2020-02-25 DIAGNOSIS — I6523 Occlusion and stenosis of bilateral carotid arteries: Secondary | ICD-10-CM | POA: Diagnosis not present

## 2020-02-25 NOTE — Progress Notes (Signed)
MRN : [id number]  Savannah Mcclure is a 79 y.o. ([birth year]) female who presents with chief complaint of  Chief Complaint  Patient presents with  . Follow-up    3week  [facility]  ABI   .  History of Present Illness: Patient returns today in follow up of her vascular issues.  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 She had significant bruising and some swelling afterwards, but this is resolved now.  Her legs are much better and she has only mild claudication symptoms at current.  She had to stop taking Plavix and aspirin due to side effects including black tarry stools.  ABIs today are up to 0.87 on the right and 0.90 on the left with good waveforms.  This is a marked improvement from her previous levels.  Current Outpatient Medications  Medication Sig Dispense Refill  . atorvastatin (LIPITOR) 10 MG tablet Take 1 tablet (10 mg total) by mouth daily. 30 tablet 11  . B-COMPLEX-C PO Take 1 tablet by mouth daily.    . benazepril (LOTENSIN) 10 MG tablet Take 1 tablet (10 mg total) by mouth daily. 90 tablet 1  . cilostazol (PLETAL) 100 MG tablet Take 1 tablet (100 mg total) by mouth 2 (two) times daily. 180 tablet 4  . clopidogrel (PLAVIX) 75 MG tablet Take 1 tablet (75 mg total) by mouth daily. 30 tablet 11  . cyclobenzaprine (FLEXERIL) 5 MG tablet Take 5 mg by mouth 3 (three) times daily as needed for muscle spasms.     . diphenhydrAMINE (BENADRYL) 25 MG tablet Take 25 mg by mouth every 6 (six) hours as needed.    . DUREZOL 0.05 % EMUL Place into the right eye every morning.     . gabapentin (NEURONTIN) 300 MG capsule Take 1 capsule (300 mg total) by mouth 2 (two) times daily. 180 capsule 2  . hydrOXYzine (ATARAX/VISTARIL) 25 MG tablet Take 1 tablet (25 mg total) by mouth 3 (three) times daily as needed. 270 tablet 3  . lidocaine (LIDODERM) 5 % Place 1 patch onto the skin daily. Remove & Discard patch within 12 hours or as directed by MD 60  patch 6  . Misc Natural Products (JOINT HEALTH PO) Take by mouth daily.    . montelukast (SINGULAIR) 10 MG tablet Take 1 tablet (10 mg total) by mouth at bedtime. 90 tablet 3  . Multiple Vitamins-Minerals (MULTIVITAMIN WITH MINERALS) tablet Take 1 tablet by mouth daily.    Marland Kitchen NIACINAMIDE PO Take 1 capsule by mouth daily.    Marland Kitchen omeprazole (PRILOSEC) 20 MG capsule Take 1 capsule (20 mg total) by mouth daily. 90 capsule 3  . tiotropium (SPIRIVA HANDIHALER) 18 MCG inhalation capsule PLACE 1 CAPSULE INTO INHALER AND INHALE THE CONTENTS DAILY 90 capsule 3  . UNABLE TO FIND Take 1 tablet by mouth 2 (two) times daily. Cardio FX    . UNABLE TO FIND Take 1 tablet by mouth daily. Cinnergy     No current facility-administered medications for this visit.    Past Medical History:  Diagnosis Date  . 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 Anxiety   . Blind right eye   . Cardiac arrest ([place])   . Cataract    right eye  . Claudication ([place])   . Complication of anesthesia 03/15/2012   "crashed" during first part of 3-part back surgery.   Marland Kitchen COPD (chronic obstructive pulmonary disease) ([place])   . Dyspnea   .  GERD (gastroesophageal reflux disease)   . Heart murmur   . Hypertension   . Leaky heart valve   . Oxygen deficit    uses O2 at night 2 liters  . Peripheral vascular disease ([place])   . Scoliosis   . Sepsis ([place])   . Spinal stenosis   . Stroke [facility])    Eye  . Tachycardia     Past Surgical History:  Procedure Laterality Date  . ABDOMINAL HYSTERECTOMY     total  . BACK SURGERY    . BREAST LUMPECTOMY Left   . CATARACT EXTRACTION W/PHACO Left 08/14/2019   Procedure: CATARACT EXTRACTION PHACO AND INTRAOCULAR LENS PLACEMENT (IOC) LEFT 10.22 00:59.7 42.4%;  Surgeon: Leandrew Koyanagi, MD;  Location: [facility];  Service: Ophthalmology;  Laterality: Left;  leave it last patient per [place]  . LOWER EXTREMITY ANGIOGRAPHY Right 06/20/2018   Procedure: LOWER EXTREMITY ANGIOGRAPHY;  Surgeon:  Algernon Huxley, MD;  Location: [facility];  Service: Cardiovascular;  Laterality: Right;  . LOWER EXTREMITY ANGIOGRAPHY Left 02/03/2020   Procedure: LOWER EXTREMITY ANGIOGRAPHY;  Surgeon: Algernon Huxley, MD;  Location: [facility];  Service: Cardiovascular;  Laterality: Left;  . 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 WISDOM TOOTH EXTRACTION       Social History   Tobacco Use  . Smoking status: Current Every Day Smoker    Packs/day: 0.50    Years: 30.00    Pack years: 15.00    Types: Cigarettes  . Smokeless tobacco: Never Used  . Tobacco comment: since "mid 106s"  Vaping Use  . Vaping Use: Some days  Substance Use Topics  . Alcohol use: Yes    Comment: wine on occasion (1x/month)  . Drug use: No     Family History  Problem Relation Age of Onset  . Intracerebral hemorrhage Mother   . Hypertension Mother   . Heart failure Father   . Schizophrenia Daughter   . Rheum arthritis Maternal Grandmother   . Kidney disease Maternal Grandmother   . Heart disease Paternal Grandmother   . Stroke Paternal Grandmother   . Stroke Paternal Grandfather   . Heart disease Paternal Grandfather     Allergies  Allergen Reactions  . Tramadol Rash  . Other Itching    Cat Gut sutures   . Oxycodone Hives  . Amlodipine Hives  . Augmentin [Amoxicillin-Pot Clavulanate] Diarrhea  . Azithromycin Hives  . Contrast Media [Iodinated Diagnostic Agents] Hives  . Doxycycline Diarrhea  . Erythromycin Diarrhea  . Hydrocodone Nausea Only  . Lactose Intolerance (Gi) Diarrhea    Bloating  . Minocycline Diarrhea  . Nsaids Other (See Comments)    Internal Bleeding  . Relafen [Nabumetone] Hives  . Sulfa Antibiotics Hives  . Tetracyclines & Related Diarrhea     REVIEW OF SYSTEMS (Negative unless checked)  Constitutional: [] Weight loss  [] Fever  [] Chills Cardiac: [] Chest pain   [] Chest pressure   [] Palpitations   [] Shortness of breath when laying flat   [] Shortness of  breath at rest   [] Shortness of breath with exertion. Vascular:  [x] Pain in legs with walking   [x] Pain in legs at rest   [] Pain in legs when laying flat   [] Claudication   [] Pain in feet when walking  [] Pain in feet at rest  [] Pain in feet when laying flat   [] History of DVT   [] Phlebitis   [] Swelling in legs   [] Varicose veins   []   Non-healing ulcers Pulmonary:   [] Uses home oxygen   [] Productive cough   [] Hemoptysis   [] Wheeze  [] COPD   [] Asthma Neurologic:  [] Dizziness  [] Blackouts   [] Seizures   [] History of stroke   [] History of TIA  [] Aphasia   [] Temporary blindness   [] Dysphagia   [] Weakness or numbness in arms   [] Weakness or numbness in legs Musculoskeletal:  [x] Arthritis   [] Joint swelling   [] Joint pain   [] Low back pain Hematologic:  [] Easy bruising  [] Easy bleeding   [] Hypercoagulable state   [] Anemic   Gastrointestinal:  [x] Blood in stool   [] Vomiting blood  [x] Gastroesophageal reflux/heartburn   [] Abdominal pain Genitourinary:  [] Chronic kidney disease   [] Difficult urination  [] Frequent urination  [] Burning with urination   [] Hematuria Skin:  [] Rashes   [] Ulcers   [] Wounds Psychological:  [] History of anxiety   []  History of major depression.  Physical Examination  BP (!) 149/79   Pulse (!) 134   Ht 5\' 1"  (1.549 m)   Wt 120 lb (54.4 kg)   BMI 22.67 kg/m  Gen:  WD/WN, NAD Head: [place]/AT, No temporalis wasting. Ear/Nose/Throat: Hearing grossly intact, nares w/o erythema or drainage Eyes: Conjunctiva clear. Sclera non-icteric Neck: Supple.  Trachea midline Pulmonary:  Good air movement, no use of accessory muscles.  Cardiac: tachycardic Vascular:  Vessel Right Left  Radial Palpable Palpable                          PT Palpable Palpable  DP Palpable Palpable   Gastrointestinal: soft, non-tender/non-distended. No guarding/reflex.  Musculoskeletal: M/S 5/5 throughout.  No deformity or atrophy. No edema. Neurologic: Sensation grossly intact in extremities.  Symmetrical.   Speech is fluent.  Psychiatric: Judgment intact, Mood & affect appropriate for pt's clinical situation. Dermatologic: No rashes or ulcers noted.  No cellulitis or open wounds.       Labs Recent Results (from the past 2160 hour(s))  SARS CORONAVIRUS 2 (TAT 6-24 HRS) Nasopharyngeal Nasopharyngeal Swab     Status: None   Collection Time: 01/30/20  1:04 PM   Specimen: Nasopharyngeal Swab  Result Value Ref Range   SARS Coronavirus 2 NEGATIVE NEGATIVE    Comment: (NOTE) SARS-CoV-2 target nucleic acids are NOT DETECTED.  The SARS-CoV-2 RNA is generally detectable in upper and lower respiratory specimens during the acute phase of infection. Negative results do not preclude SARS-CoV-2 infection, do not rule out co-infections with other pathogens, and should not be used as the sole basis for treatment or other patient management decisions. Negative results must be combined with clinical observations, patient history, and epidemiological information. The expected result is Negative.  Fact Sheet for Patients: SugarRoll.be  Fact Sheet for Healthcare Providers: https://www.woods-mathews.com/  This test is not yet approved or cleared by the [place] FDA and  has been authorized for detection and/or diagnosis of SARS-CoV-2 by FDA under an Emergency Use Authorization (EUA). This EUA will remain  in effect (meaning this test can be used) for the duration of the COVID-19 declaration under Se ction 564(b)(1) of the Act, 21 U.S.C. section 360bbb-3(b)(1), unless the authorization is terminated or revoked sooner.  Performed at [facility], [place] [address], [place], [place] [address]   BUN     Status: None   Collection Time: 02/03/20  9:46 AM  Result Value Ref Range   BUN 11 8 - 23 mg/dL    Comment: Performed at [facility], [place],  [place] [address]  Creatinine, serum     Status: None   Collection Time:  02/03/20  9:46 AM  Result Value Ref Range   Creatinine, Ser 0.76 0.44 - 1.00 mg/dL   GFR calc non Af Amer >60 >60 mL/min   GFR calc Af Amer >60 >60 mL/min    Comment: Performed at [facility], [address] [place], [place] [address]    Radiology PERIPHERAL VASCULAR CATHETERIZATION  Result Date: 02/03/2020 See op note   Assessment/Plan  HTN (hypertension) blood pressure control important in reducing the progression of atherosclerotic disease. On appropriate oral medications.   Carotid stenosis Stable at last check earlier this year.  No new symptoms.  To be checked later this year.  Atherosclerosis of native artery of both lower extremities with intermittent claudication (HCC) ABIs today are up to 0.87 on the right and 0.90 on the left with good waveforms.  This is a marked improvement from her previous levels.  Clinically, she is doing much better with essentially no claudication symptoms at current.  I am worried about durability if she has not been able to tolerate any antiplatelets including a baby aspirin.  These are iliac stents, but particularly in a smoker the long-term durability is not going to be great without some sort of antiplatelet therapy. We will plan follow up in 3-4 months with non-invasive studies.    Leotis Pain, MD  02/25/2020 2:27 PM    This note was created with Dragon medical transcription system.  Any errors from dictation are purely unintentional

## 2020-02-25 NOTE — Assessment & Plan Note (Signed)
Stable at last check earlier this year.  No new symptoms.  To be checked later this year.

## 2020-02-25 NOTE — Assessment & Plan Note (Signed)
ABIs today are up to 0.87 on the right and 0.90 on the left with good waveforms.  This is a marked improvement from her previous levels.  Clinically, she is doing much better with essentially no claudication symptoms at current.  I am worried about durability if she has not been able to tolerate any antiplatelets including a baby aspirin.  These are iliac stents, but particularly in a smoker the long-term durability is not going to be great without some sort of antiplatelet therapy. We will plan follow up in 3-4 months with non-invasive studies.

## 2020-02-25 NOTE — Assessment & Plan Note (Signed)
blood pressure control important in reducing the progression of atherosclerotic disease. On appropriate oral medications.  

## 2020-03-09 ENCOUNTER — Encounter: Payer: Self-pay | Admitting: Family Medicine

## 2020-03-09 ENCOUNTER — Other Ambulatory Visit: Payer: Self-pay

## 2020-03-09 ENCOUNTER — Ambulatory Visit (INDEPENDENT_AMBULATORY_CARE_PROVIDER_SITE_OTHER): Payer: Medicare Other | Admitting: Family Medicine

## 2020-03-09 VITALS — BP 95/64 | HR 136 | Temp 98.7°F | Ht 62.21 in | Wt 119.6 lb

## 2020-03-09 DIAGNOSIS — D649 Anemia, unspecified: Secondary | ICD-10-CM

## 2020-03-09 DIAGNOSIS — F419 Anxiety disorder, unspecified: Secondary | ICD-10-CM | POA: Diagnosis not present

## 2020-03-09 DIAGNOSIS — E785 Hyperlipidemia, unspecified: Secondary | ICD-10-CM

## 2020-03-09 DIAGNOSIS — I10 Essential (primary) hypertension: Secondary | ICD-10-CM

## 2020-03-09 DIAGNOSIS — D692 Other nonthrombocytopenic purpura: Secondary | ICD-10-CM | POA: Diagnosis not present

## 2020-03-09 DIAGNOSIS — Z1283 Encounter for screening for malignant neoplasm of skin: Secondary | ICD-10-CM | POA: Diagnosis not present

## 2020-03-09 DIAGNOSIS — Z1159 Encounter for screening for other viral diseases: Secondary | ICD-10-CM

## 2020-03-09 DIAGNOSIS — J410 Simple chronic bronchitis: Secondary | ICD-10-CM

## 2020-03-09 DIAGNOSIS — Z0000 Encounter for general adult medical examination without abnormal findings: Secondary | ICD-10-CM

## 2020-03-09 DIAGNOSIS — I6523 Occlusion and stenosis of bilateral carotid arteries: Secondary | ICD-10-CM | POA: Diagnosis not present

## 2020-03-09 DIAGNOSIS — R8281 Pyuria: Secondary | ICD-10-CM

## 2020-03-09 DIAGNOSIS — Z23 Encounter for immunization: Secondary | ICD-10-CM | POA: Diagnosis not present

## 2020-03-09 LAB — URINALYSIS, ROUTINE W REFLEX MICROSCOPIC
Bilirubin, UA: NEGATIVE
Glucose, UA: NEGATIVE
Nitrite, UA: NEGATIVE
Protein,UA: NEGATIVE
RBC, UA: NEGATIVE
Specific Gravity, UA: 1.015 (ref 1.005–1.030)
Urobilinogen, Ur: 0.2 mg/dL (ref 0.2–1.0)
pH, UA: 5 (ref 5.0–7.5)

## 2020-03-09 LAB — MICROSCOPIC EXAMINATION: RBC, Urine: NONE SEEN /hpf (ref 0–2)

## 2020-03-09 LAB — MICROALBUMIN, URINE WAIVED
Creatinine, Urine Waived: 200 mg/dL (ref 10–300)
Microalb, Ur Waived: 30 mg/L — ABNORMAL HIGH (ref 0–19)
Microalb/Creat Ratio: <30 mg/g (ref <30)

## 2020-03-09 MED ORDER — BENAZEPRIL HCL 5 MG PO TABS: 5.0000 mg | ORAL_TABLET | Freq: Every day | ORAL | 1 refills | Status: DC

## 2020-03-09 NOTE — Assessment & Plan Note (Signed)
Under good control on current regimen. Continue current regimen. Continue to monitor. Call with any concerns. Refills given. Labs drawn today.   

## 2020-03-09 NOTE — Progress Notes (Signed)
BP (!) 95/64 (BP Location: Left Arm, Patient Position: Sitting, Cuff Size: Small)   Pulse (!) 136   Temp 98.7 F (37.1 C) (Oral)   Ht 5' 2.21" (1.58 m)   Wt 119 lb 9.6 oz (54.3 kg)   SpO2 96%   BMI 21.73 kg/m    Subjective:    Patient ID: Savannah Mcclure, female    DOB: 01-20-1941, 79 y.o.   MRN: [id number]  HPI: Savannah Mcclure is a 79 y.o. female presenting on 03/09/2020 for comprehensive medical examination. Current medical complaints include:  Has not been doing well. Her daughter just got out of the hospital after [place]. Her grand daughter has been removed from the house.   HYPERTENSION / HYPERLIPIDEMIA Satisfied with current treatment? yes Duration of hypertension: chronic BP monitoring frequency: not checking BP medication side effects: no Past BP meds: benazepril Duration of hyperlipidemia: chronic Cholesterol medication side effects: yes Cholesterol supplements: none Past cholesterol medications: atorvastatin- refuses to to continue Medication compliance: good compliance Aspirin: no Recent stressors: yes Recurrent headaches: no Visual changes: no Palpitations: yes Dyspnea: no Chest pain: yes Lower extremity edema: no Dizzy/lightheaded: yes  COPD COPD status: stable Satisfied with current treatment?: yes Oxygen use: no Dyspnea frequency: with exertion Cough frequency: daily Rescue inhaler frequency: occasionally    Limitation of activity: yes Pneumovax: Not up to Date Influenza: Up to Date  ANEMIA Anemia status: stable Compliance with treatment: fair compliance Iron supplementation side effects: no Severity of anemia: moderate Fatigue: yes Decreased exercise tolerance: yes  Dyspnea on exertion: yes Palpitations: no Bleeding: no Pica: no  ANXIETY/STRESS Duration:exacerbated Anxious mood: yes  Excessive worrying: yes Irritability: no  Sweating: no Nausea: no Palpitations:no Hyperventilation: no Panic attacks:  no Agoraphobia: no  Obscessions/compulsions: no Depressed mood: no Depression screen [facility] 2/9 04/25/2019 02/20/2019 11/22/2018 09/06/2018 06/19/2018  Decreased Interest 0 0 0 0 0  Down, Depressed, Hopeless 0 0 0 0 0  PHQ - 2 Score 0 0 0 0 0  Altered sleeping 2 - 2 1 -  Tired, decreased energy 1 - 1 1 -  Change in appetite 0 - 1 0 -  Feeling bad or failure about yourself  0 - 0 0 -  Trouble concentrating 0 - 0 0 -  Moving slowly or fidgety/restless 0 - 0 0 -  Suicidal thoughts 0 - 0 0 -  PHQ-9 Score 3 - 4 2 -  Difficult doing work/chores Not difficult at all - - Not difficult at all -  Some recent data might be hidden   Anhedonia: no Weight changes: no Insomnia: no   Hypersomnia: no Fatigue/loss of energy: yes Feelings of worthlessness: no Feelings of guilt: no Impaired concentration/indecisiveness: no Suicidal ideations: no  Crying spells: no Recent Stressors/Life Changes: yes   Relationship problems: no   Family stress: yes     Financial stress: yes    Job stress: no    Recent death/loss: no  Menopausal Symptoms: no  Functional Status Survey: Is the patient deaf or have difficulty hearing?: Yes Does the patient have difficulty seeing, even when wearing glasses/contacts?: No Does the patient have difficulty concentrating, remembering, or making decisions?: No Does the patient have difficulty walking or climbing stairs?: No Does the patient have difficulty dressing or bathing?: No Does the patient have difficulty doing errands alone such as visiting a doctor's office or shopping?: No  Fall Risk  03/09/2020 01/30/2020 03/04/2019 02/20/2019 11/22/2018  Falls in the past year? 0 0 0 0 0  Number falls in past yr: - - 0 - 0  Injury with Fall? - - 0 - 0  Risk for fall due to : - No Fall Risks - - -  Follow up - Falls evaluation completed - - -    Depression Screen Depression screen [facility] 2/9 03/09/2020 04/25/2019 02/20/2019 11/22/2018 09/06/2018  Decreased Interest 2 0 0 0 0  Down,  Depressed, Hopeless 2 0 0 0 0  PHQ - 2 Score 4 0 0 0 0  Altered sleeping 0 2 - 2 1  Tired, decreased energy 1 1 - 1 1  Change in appetite 1 0 - 1 0  Feeling bad or failure about yourself  0 0 - 0 0  Trouble concentrating 0 0 - 0 0  Moving slowly or fidgety/restless 0 0 - 0 0  Suicidal thoughts 0 0 - 0 0  PHQ-9 Score 6 3 - 4 2  Difficult doing work/chores Somewhat difficult Not difficult at all - - Not difficult at all  Some recent data might be hidden   Advanced Directives Does patient have a HCPOA?    yes Does patient have a living will or MOST form?  yes  Past Medical History:  Past Medical History:  Diagnosis Date  . Abdominal distension 03/18/2012  . Abnormal weight loss 10/30/2017  . Acute kidney injury ([place]) 03/22/2012  . 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 Anxiety   . Blind right eye   . Cardiac arrest ([place])   . Cataract    right eye  . Claudication ([place])   . Complication of anesthesia 03/15/2012   "crashed" during first part of 3-part back surgery.   Marland Kitchen COPD (chronic obstructive pulmonary disease) ([place])   . Delirium 03/18/2012  . Dyspnea   . GERD (gastroesophageal reflux disease)   . Heart murmur   . Hypertension   . Leaky heart valve   . Oxygen deficit    uses O2 at night 2 liters  . Peripheral vascular disease ([name])   . Scoliosis   . Sepsis ([place])   . Shock ([place]) 03/23/2012  . SIRS (systemic inflammatory response syndrome) ([place]) 03/23/2012  . Spinal stenosis   . Stroke [facility])    Eye  . Tachycardia     Surgical History:  Past Surgical History:  Procedure Laterality Date  . ABDOMINAL HYSTERECTOMY     total  . BACK SURGERY    . BREAST LUMPECTOMY Left   . CATARACT EXTRACTION W/PHACO Left 08/14/2019   Procedure: CATARACT EXTRACTION PHACO AND INTRAOCULAR LENS PLACEMENT (IOC) LEFT 10.22 00:59.7 42.4%;  Surgeon: Leandrew Koyanagi, MD;  Location: [facility];  Service: Ophthalmology;  Laterality: Left;  leave it last patient per [place]  . LOWER EXTREMITY  ANGIOGRAPHY Right 06/20/2018   Procedure: LOWER EXTREMITY ANGIOGRAPHY;  Surgeon: Algernon Huxley, MD;  Location: [facility];  Service: Cardiovascular;  Laterality: Right;  . LOWER EXTREMITY ANGIOGRAPHY Left 02/03/2020   Procedure: LOWER EXTREMITY ANGIOGRAPHY;  Surgeon: Algernon Huxley, MD;  Location: [facility];  Service: Cardiovascular;  Laterality: Left;  . 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 WISDOM TOOTH EXTRACTION      Medications:  Current Outpatient Medications on File Prior to Visit  Medication Sig  . B-COMPLEX-C PO Take 1 tablet by mouth daily.  . cilostazol (PLETAL) 100 MG tablet Take 1 tablet (100 mg total) by mouth 2 (two) times daily.  . cyclobenzaprine (  FLEXERIL) 5 MG tablet Take 5 mg by mouth 3 (three) times daily as needed for muscle spasms.   . diphenhydrAMINE (BENADRYL) 25 MG tablet Take 25 mg by mouth every 6 (six) hours as needed.  . DUREZOL 0.05 % EMUL Place into the right eye every morning.   . gabapentin (NEURONTIN) 300 MG capsule Take 1 capsule (300 mg total) by mouth 2 (two) times daily.  . hydrOXYzine (ATARAX/VISTARIL) 25 MG tablet Take 1 tablet (25 mg total) by mouth 3 (three) times daily as needed.  . lidocaine (LIDODERM) 5 % Place 1 patch onto the skin daily. Remove & Discard patch within 12 hours or as directed by MD  . Misc Natural Products ([place]) Take by mouth daily.  . montelukast (SINGULAIR) 10 MG tablet Take 1 tablet (10 mg total) by mouth at bedtime.  . Multiple Vitamins-Minerals (MULTIVITAMIN WITH MINERALS) tablet Take 1 tablet by mouth daily.  Marland Kitchen NIACINAMIDE PO Take 1 capsule by mouth daily.  Marland Kitchen omeprazole (PRILOSEC) 20 MG capsule Take 1 capsule (20 mg total) by mouth daily.  Marland Kitchen tiotropium (SPIRIVA HANDIHALER) 18 MCG inhalation capsule PLACE 1 CAPSULE INTO INHALER AND INHALE THE CONTENTS DAILY  . UNABLE TO FIND Take 1 tablet by mouth 2 (two) times daily. Cardio FX  . UNABLE TO FIND Take 1 tablet by mouth daily.  Cinnergy  . atorvastatin (LIPITOR) 10 MG tablet Take 1 tablet (10 mg total) by mouth daily. (Patient not taking: Reported on 03/09/2020)   No current facility-administered medications on file prior to visit.    Allergies:  Allergies  Allergen Reactions  . Tramadol Rash  . Other Itching    Cat Gut sutures   . Oxycodone Hives  . Amlodipine Hives  . Augmentin [Amoxicillin-Pot Clavulanate] Diarrhea  . Azithromycin Hives  . Contrast Media [Iodinated Diagnostic Agents] Hives  . Doxycycline Diarrhea  . Erythromycin Diarrhea  . Hydrocodone Nausea Only  . Lactose Intolerance (Gi) Diarrhea    Bloating  . Minocycline Diarrhea  . Nsaids Other (See Comments)    Internal Bleeding  . Relafen [Nabumetone] Hives  . Sulfa Antibiotics Hives  . Tetracyclines & Related Diarrhea    Social History:  Social History   Socioeconomic History  . Marital status: Widowed    Spouse name: Not on file  . Number of children: 2  . Years of education: college   . Highest education level: Not on file  Occupational History  . Occupation: retired  Tobacco Use  . Smoking status: Current Every Day Smoker    Packs/day: 0.50    Years: 30.00    Pack years: 15.00    Types: Cigarettes  . Smokeless tobacco: Never Used  . Tobacco comment: since "mid 34s"  Vaping Use  . Vaping Use: Some days  Substance and Sexual Activity  . Alcohol use: Yes    Comment: wine on occasion (1x/month)  . Drug use: No  . Sexual activity: Not on file  Other Topics Concern  . Not on file  Social History Narrative   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 And grandkids (14 & 70 y.o.)   Social Determinants of Health   Financial Resource Strain:   . Difficulty of Paying Living Expenses:   Food Insecurity:   . Worried About Charity fundraiser in the Last Year:   . Arboriculturist in the Last Year:   Transportation Needs:   . Film/video editor (Medical):   Marland Kitchen Lack of Transportation (Non-Medical):   Physical  Activity:   . Days of Exercise per Week:   . Minutes of Exercise per Session:   Stress:   . Feeling of Stress :   Social Connections:   . Frequency of Communication with Friends and Family:   . Frequency of Social Gatherings with Friends and Family:   . Attends Religious Services:   . Active Member of Clubs or Organizations:   . Attends Archivist Meetings:   Marland Kitchen Marital Status:   Intimate Partner Violence:   . Fear of Current or Ex-Partner:   . Emotionally Abused:   Marland Kitchen Physically Abused:   . Sexually Abused:    Social History   Tobacco Use  Smoking Status Current Every Day Smoker  . Packs/day: 0.50  . Years: 30.00  . Pack years: 15.00  . Types: Cigarettes  Smokeless Tobacco Never Used  Tobacco Comment   since "mid 51s"   Social History   Substance and Sexual Activity  Alcohol Use Yes   Comment: wine on occasion (1x/month)    Family History:  Family History  Problem Relation Age of Onset  . Intracerebral hemorrhage Mother   . Hypertension Mother   . Heart failure Father   . Schizophrenia Daughter   . Rheum arthritis Maternal Grandmother   . Kidney disease Maternal Grandmother   . Heart disease Paternal Grandmother   . Stroke Paternal Grandmother   . Stroke Paternal Grandfather   . Heart disease Paternal Grandfather     Past medical history, surgical history, medications, allergies, family history and social history reviewed with patient today and changes made to appropriate areas of the chart.   Review of Systems  Constitutional: Negative.   HENT: Negative.   Eyes: Negative.   Respiratory: Positive for cough. Negative for hemoptysis, sputum production, shortness of breath and wheezing.   Cardiovascular: Positive for chest pain. Negative for palpitations, orthopnea, claudication, leg swelling and PND.  Gastrointestinal: Positive for heartburn. Negative for abdominal pain, blood in stool, constipation, diarrhea, melena, nausea and vomiting.   Genitourinary: Negative.   Musculoskeletal: Positive for neck pain. Negative for back pain, falls, joint pain and myalgias.  Skin: Negative.   Neurological: Negative.   Endo/Heme/Allergies: Negative for environmental allergies and polydipsia. Bruises/bleeds easily.  Psychiatric/Behavioral: Negative for depression, hallucinations, memory loss, substance abuse and suicidal ideas. The patient is nervous/anxious. The patient does not have insomnia.     All other ROS negative except what is listed above and in the HPI.      Objective:    BP (!) 95/64 (BP Location: Left Arm, Patient Position: Sitting, Cuff Size: Small)   Pulse (!) 136   Temp 98.7 F (37.1 C) (Oral)   Ht 5' 2.21" (1.58 m)   Wt 119 lb 9.6 oz (54.3 kg)   SpO2 96%   BMI 21.73 kg/m   Wt Readings from Last 3 Encounters:  03/09/20 119 lb 9.6 oz (54.3 kg)  02/25/20 120 lb (54.4 kg)  02/03/20 120 lb (54.4 kg)    Physical Exam Vitals and nursing note reviewed.  Constitutional:      General: She is not in acute distress.    Appearance: Normal appearance. She is not ill-appearing, toxic-appearing or diaphoretic.  HENT:     Head: Normocephalic and atraumatic.     Right Ear: External ear normal.     Left Ear: External ear normal.     Nose: Nose normal.     Mouth/Throat:     Mouth: Mucous membranes are moist.  Pharynx: Oropharynx is clear.  Eyes:     General: No scleral icterus.       Right eye: No discharge.        Left eye: No discharge.     Extraocular Movements: Extraocular movements intact.     Conjunctiva/sclera: Conjunctivae normal.     Pupils: Pupils are equal, round, and reactive to light.  Cardiovascular:     Rate and Rhythm: Normal rate and regular rhythm.     Pulses: Normal pulses.     Heart sounds: Normal heart sounds. No murmur heard.  No friction rub. No gallop.   Pulmonary:     Effort: Pulmonary effort is normal. No respiratory distress.     Breath sounds: Normal breath sounds. No stridor. No  wheezing, rhonchi or rales.  Chest:     Chest wall: No tenderness.  Musculoskeletal:        General: Normal range of motion.     Cervical back: Normal range of motion and neck supple.  Skin:    General: Skin is warm and dry.     Capillary Refill: Capillary refill takes less than 2 seconds.     Coloration: Skin is not jaundiced or pale.     Findings: No bruising, erythema, lesion or rash.  Neurological:     General: No focal deficit present.     Mental Status: She is alert and oriented to person, place, and time. Mental status is at baseline.  Psychiatric:        Mood and Affect: Mood normal.        Behavior: Behavior normal.        Thought Content: Thought content normal.        Judgment: Judgment normal.     6CIT Screen 03/09/2020 11/20/2017  What Year? 0 points 0 points  What month? 0 points 0 points  What time? 0 points 0 points  Count back from 20 0 points 0 points  Months in reverse 0 points 0 points  Repeat phrase 0 points 0 points  Total Score 0 0     Results for orders placed or performed in visit on 03/09/20  Microscopic Examination   Urine  Result Value Ref Range   WBC, UA 0-5 0 - 5 /hpf   RBC None seen 0 - 2 /hpf   Epithelial Cells (non renal) 0-10 0 - 10 /hpf   Bacteria, UA Moderate (A) None seen/Few  Microalbumin, Urine Waived  Result Value Ref Range   Microalb, Ur Waived 30 (H) 0 - 19 mg/L   Creatinine, Urine Waived 200 10 - 300 mg/dL   Microalb/Creat Ratio <30 <30 mg/g  Urinalysis, Routine w reflex microscopic  Result Value Ref Range   Specific Gravity, UA 1.015 1.005 - 1.030   pH, UA 5.0 5.0 - 7.5   Color, UA Yellow Yellow   Appearance Ur Clear Clear   Leukocytes,UA Trace (A) Negative   Protein,UA Negative Negative/Trace   Glucose, UA Negative Negative   Ketones, UA Trace (A) Negative   RBC, UA Negative Negative   Bilirubin, UA Negative Negative   Urobilinogen, Ur 0.2 0.2 - 1.0 mg/dL   Nitrite, UA Negative Negative   Microscopic Examination See  below:       Assessment & Plan:   Problem List Items Addressed This Visit      Cardiovascular and Mediastinum   HTN (hypertension)    Overtreated. Will decrease her benazepril and recheck 1 month. Labs drawn today.  Relevant Medications   benazepril (LOTENSIN) 5 MG tablet   Other Relevant Orders   CBC with Differential/Platelet   Comprehensive metabolic panel   Microalbumin, Urine Waived (Completed)   TSH   Urinalysis, Routine w reflex microscopic (Completed)     Respiratory   COPD (chronic obstructive pulmonary disease) (HCC)    Under good control on current regimen. Continue current regimen. Continue to monitor. Call with any concerns. Refills given. Labs drawn today.       Relevant Orders   CBC with Differential/Platelet   Comprehensive metabolic panel   TSH     Other   Anxiety    Not doing well due to social stressors. Continue current regimen. Continue to monitor. Call with any concerns.       Anemia    Under good control on current regimen. Continue current regimen. Continue to monitor. Call with any concerns. Refills given. Labs drawn today.       Relevant Orders   CBC with Differential/Platelet   Comprehensive metabolic panel   TSH   Dyslipidemia    Refuses statin. Rehcecking labs today. Await results. Treat as needed.       Relevant Orders   CBC with Differential/Platelet   Comprehensive metabolic panel   Lipid Panel w/o Chol/HDL Ratio   TSH    Other Visit Diagnoses    Encounter for Medicare annual wellness exam    -  Primary   Preventative care discussed today as below.   Senile purpura ([place])       Reassured patient. Continue to monitor.    Relevant Medications   benazepril (LOTENSIN) 5 MG tablet   Other Relevant Orders   CBC with Differential/Platelet   Comprehensive metabolic panel   TSH   Encounter for hepatitis C screening test for low risk patient       Labs drawn today. Await results.    Relevant Orders   Hepatitis C Antibody    Screening for skin cancer       Referral to dermatology made today.   Relevant Orders   Ambulatory referral to Dermatology   TSH       Preventative Services:  Health Risk Assessment and Personalized Prevention Plan: Done today Bone Mass Measurements: Declined Breast Cancer Screening:N/A CVD Screening: doen today Cervical Cancer Screening: N/A Colon Cancer Screening: N/A Depression Screening: done today Diabetes Screening: done today Glaucoma Screening: See your eye doctor Hepatitis B vaccine: N/A Hepatitis C screening: done today HIV Screening: Up to date Flu Vaccine: get in the fall Lung cancer Screening: Due Obesity Screening: Done today Pneumonia Vaccines (2): done today STI Screening: N/A  Follow up plan: Return in about 4 weeks (around 04/06/2020) for follow up BP.   LABORATORY TESTING:  - Pap smear: not applicable  IMMUNIZATIONS:   - Tdap: Tetanus vaccination status reviewed: last tetanus booster within 10 years. - Influenza: Postponed to flu season - Pneumovax: administered today - Prevnar: Up to date - COVID: up to date  SCREENING: -Mammogram: Not applicable  - Colonoscopy: Not applicable  - Bone Density: Refused  -Hearing Test: Ordered today   PATIENT COUNSELING:   Advised to take 1 mg of folate supplement per day if capable of pregnancy.   Sexuality: Discussed sexually transmitted diseases, partner selection, use of condoms, avoidance of unintended pregnancy  and contraceptive alternatives.   Advised to avoid cigarette smoking.  I discussed with the patient that most people either abstain from alcohol or drink within safe limits (<=14/week and <=4 drinks/occasion for  males, <=7/weeks and <= 3 drinks/occasion for females) and that the risk for alcohol disorders and other health effects rises proportionally with the number of drinks per week and how often a drinker exceeds daily limits.  Discussed cessation/primary prevention of drug use and  availability of treatment for abuse.   Diet: Encouraged to adjust caloric intake to maintain  or achieve ideal body weight, to reduce intake of dietary saturated fat and total fat, to limit sodium intake by avoiding high sodium foods and not adding table salt, and to maintain adequate dietary potassium and calcium preferably from fresh fruits, vegetables, and low-fat dairy products.    stressed the importance of regular exercise  Injury prevention: Discussed safety belts, safety helmets, smoke detector, smoking near bedding or upholstery.   Dental health: Discussed importance of regular tooth brushing, flossing, and dental visits.    NEXT PREVENTATIVE PHYSICAL DUE IN 1 YEAR. Return in about 4 weeks (around 04/06/2020) for follow up BP.

## 2020-03-09 NOTE — Assessment & Plan Note (Signed)
Overtreated. Will decrease her benazepril and recheck 1 month. Labs drawn today.

## 2020-03-09 NOTE — Progress Notes (Deleted)
BP (!) 95/64 (BP Location: Left Arm, Patient Position: Sitting, Cuff Size: Small)    Pulse (!) 136    Temp 98.7 F (37.1 C) (Oral)    Ht 5' 2.21" (1.58 m)    Wt 119 lb 9.6 oz (54.3 kg)    SpO2 96%    BMI 21.73 kg/m    Subjective:    Patient ID: Savannah Mcclure, female    DOB: Dec 21, 1940, 79 y.o.   MRN: [id number]  HPI: Savannah Mcclure is a 79 y.o. female presenting on 03/09/2020 for comprehensive medical examination. Current medical complaints include:  Has not been doing well. Her daughter just got out of the hospital after [place]. Her grand daughter has been removed from the house.   HYPERTENSION / HYPERLIPIDEMIA Satisfied with current treatment? yes Duration of hypertension: chronic BP monitoring frequency: not checking BP medication side effects: no Past BP meds: benazepril Duration of hyperlipidemia: chronic Cholesterol medication side effects: yes Cholesterol supplements: none Past cholesterol medications: atorvastatin- refuses to to continue Medication compliance: good compliance Aspirin: no Recent stressors: yes Recurrent headaches: no Visual changes: no Palpitations: yes Dyspnea: no Chest pain: yes Lower extremity edema: no Dizzy/lightheaded: yes  COPD COPD status: stable Satisfied with current treatment?: yes Oxygen use: no Dyspnea frequency: with exertion Cough frequency: daily Rescue inhaler frequency: occasionally    Limitation of activity: yes Pneumovax: Not up to Date Influenza: Up to Date  ANEMIA Anemia status: stable Compliance with treatment: fair compliance Iron supplementation side effects: no Severity of anemia: moderate Fatigue: yes Decreased exercise tolerance: yes  Dyspnea on exertion: yes Palpitations: no Bleeding: no Pica: no  ANXIETY/STRESS Duration:exacerbated Anxious mood: yes  Excessive worrying: yes Irritability: no  Sweating: no Nausea: no Palpitations:no Hyperventilation: no Panic attacks:  no Agoraphobia: no  Obscessions/compulsions: no Depressed mood: no Depression screen [facility] 2/9 04/25/2019 02/20/2019 11/22/2018 09/06/2018 06/19/2018  Decreased Interest 0 0 0 0 0  Down, Depressed, Hopeless 0 0 0 0 0  PHQ - 2 Score 0 0 0 0 0  Altered sleeping 2 - 2 1 -  Tired, decreased energy 1 - 1 1 -  Change in appetite 0 - 1 0 -  Feeling bad or failure about yourself  0 - 0 0 -  Trouble concentrating 0 - 0 0 -  Moving slowly or fidgety/restless 0 - 0 0 -  Suicidal thoughts 0 - 0 0 -  PHQ-9 Score 3 - 4 2 -  Difficult doing work/chores Not difficult at all - - Not difficult at all -  Some recent data might be hidden   Anhedonia: no Weight changes: no Insomnia: no   Hypersomnia: no Fatigue/loss of energy: yes Feelings of worthlessness: no Feelings of guilt: no Impaired concentration/indecisiveness: no Suicidal ideations: no  Crying spells: no Recent Stressors/Life Changes: yes   Relationship problems: no   Family stress: yes     Financial stress: yes    Job stress: no    Recent death/loss: no  Menopausal Symptoms: no  Depression Screen done today and results listed below:  Depression screen [facility] 2/9 04/25/2019 02/20/2019 11/22/2018 09/06/2018 06/19/2018  Decreased Interest 0 0 0 0 0  Down, Depressed, Hopeless 0 0 0 0 0  PHQ - 2 Score 0 0 0 0 0  Altered sleeping 2 - 2 1 -  Tired, decreased energy 1 - 1 1 -  Change in appetite 0 - 1 0 -  Feeling bad or failure about yourself  0 - 0 0 -  Trouble concentrating 0 - 0 0 -  Moving slowly or fidgety/restless 0 - 0 0 -  Suicidal thoughts 0 - 0 0 -  PHQ-9 Score 3 - 4 2 -  Difficult doing work/chores Not difficult at all - - Not difficult at all -  Some recent data might be hidden     Past Medical History:  Past Medical History:  Diagnosis Date   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    COPD (chronic obstructive pulmonary disease) (HCC)    Dyspnea    GERD (gastroesophageal reflux disease)    Heart murmur    Hypertension    Leaky heart valve    Oxygen deficit    uses O2 at night 2 liters   Peripheral vascular disease ([place])    Scoliosis    Sepsis ([place])    Spinal stenosis    Stroke [facility])    Eye   Tachycardia     Surgical History:  Past Surgical History:  Procedure Laterality Date   ABDOMINAL HYSTERECTOMY     total   BACK SURGERY     BREAST LUMPECTOMY Left    CATARACT EXTRACTION W/PHACO Left 08/14/2019   Procedure: CATARACT EXTRACTION PHACO AND INTRAOCULAR LENS PLACEMENT (IOC) LEFT 10.22 00:59.7 42.4%;  Surgeon: Leandrew Koyanagi, MD;  Location: [place];  Service: Ophthalmology;  Laterality: Left;  leave it last patient per [place] Right 06/20/2018   Procedure: LOWER EXTREMITY ANGIOGRAPHY;  Surgeon: Algernon Huxley, MD;  Location: [place] CV LAB;  Service: Cardiovascular;  Laterality: Right;   LOWER EXTREMITY ANGIOGRAPHY Left 02/03/2020   Procedure: LOWER EXTREMITY ANGIOGRAPHY;  Surgeon: Algernon Huxley, MD;  Location: [place] CV LAB;  Service: Cardiovascular;  Laterality: Left;   right eye surgery     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   benazepril (LOTENSIN) 10 MG tablet Take 1 tablet (10 mg total) by mouth daily.   cilostazol (PLETAL) 100 MG tablet Take 1 tablet (100 mg total) by mouth 2 (two) times daily.   cyclobenzaprine (FLEXERIL) 5 MG tablet Take 5 mg by mouth 3 (three) times daily as needed for muscle spasms.    diphenhydrAMINE (BENADRYL) 25 MG tablet Take 25 mg by mouth every 6 (six) hours as needed.   DUREZOL 0.05 % EMUL Place into the right eye every morning.      gabapentin (NEURONTIN) 300 MG capsule Take 1 capsule (300 mg total) by mouth 2 (two) times daily.   hydrOXYzine (ATARAX/VISTARIL) 25 MG tablet Take 1 tablet (25 mg total) by mouth 3 (three) times daily as needed.   lidocaine (LIDODERM) 5 % Place 1 patch onto the skin daily. Remove & Discard patch within 12 hours or as directed by MD   Misc Natural Products ([place]) Take by mouth daily.   montelukast (SINGULAIR) 10 MG tablet Take 1 tablet (10 mg total) by mouth at bedtime.   Multiple Vitamins-Minerals (MULTIVITAMIN WITH MINERALS) tablet Take 1 tablet by mouth daily.   NIACINAMIDE PO Take 1  capsule by mouth daily.   omeprazole (PRILOSEC) 20 MG capsule Take 1 capsule (20 mg total) by mouth daily.   tiotropium (SPIRIVA HANDIHALER) 18 MCG inhalation capsule PLACE 1 CAPSULE INTO INHALER AND INHALE THE CONTENTS DAILY   UNABLE TO FIND Take 1 tablet by mouth 2 (two) times daily. Cardio FX   UNABLE TO FIND Take 1 tablet by mouth daily. Cinnergy   atorvastatin (LIPITOR) 10 MG tablet Take 1 tablet (10 mg total) by mouth daily. (Patient not taking: Reported on 03/09/2020)   No current facility-administered medications on file prior to visit.    Allergies:  Allergies  Allergen Reactions   Tramadol Rash   Other Itching    Cat Gut sutures    Oxycodone Hives   Amlodipine Hives   Augmentin [Amoxicillin-Pot Clavulanate] Diarrhea   Azithromycin Hives   Contrast Media [Iodinated Diagnostic Agents] Hives   Doxycycline Diarrhea   Erythromycin Diarrhea   Hydrocodone Nausea Only   Lactose Intolerance (Gi) Diarrhea    Bloating   Minocycline Diarrhea   Nsaids Other (See Comments)    Internal Bleeding   Relafen [Nabumetone] Hives   Sulfa Antibiotics Hives   Tetracyclines & Related Diarrhea    Social History:  Social History   Socioeconomic History   Marital status: Widowed    Spouse name: Not on file   Number of children: 2   Years of education: college     Highest education level: Not on file  Occupational History   Occupation: retired  Tobacco Use   Smoking status: Current Every Day Smoker    Packs/day: 0.50    Years: 30.00    Pack years: 15.00    Types: Cigarettes   Smokeless tobacco: Never Used   Tobacco comment: since "mid 40s"  Vaping Use   Vaping Use: Some days  Substance and Sexual Activity   Alcohol use: Yes    Comment: wine on occasion (1x/month)   Drug use: No   Sexual activity: Not on file  Other Topics Concern   Not on file  Social History Narrative   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 And grandkids (60 & 43 y.o.)   Social Determinants of Health   Financial Resource Strain:    Difficulty of Paying Living Expenses:   Food Insecurity:    Worried About Charity fundraiser in the Last Year:    Arboriculturist in the Last Year:   Transportation Needs:    Film/video editor (Medical):    Lack of Transportation (Non-Medical):   Physical Activity:    Days of Exercise per Week:    Minutes of Exercise per Session:   Stress:    Feeling of Stress :   Social Connections:    Frequency of Communication with Friends and Family:    Frequency of Social Gatherings with Friends and Family:    Attends Religious Services:    Active Member of Clubs or Organizations:    Attends Music therapist:    Marital Status:   Intimate Partner Violence:    Fear of Current or Ex-Partner:    Emotionally Abused:    Physically Abused:    Sexually Abused:    Social History   Tobacco Use  Smoking Status Current Every Day Smoker   Packs/day: 0.50   Years: 30.00   Pack years: 15.00   Types: Cigarettes  Smokeless Tobacco Never Used  Tobacco Comment   since "mid 95s"   Social History   Substance and Sexual Activity  Alcohol Use Yes   Comment: wine on occasion (1x/month)    Family History:  Family History  Problem Relation Age of Onset   Intracerebral hemorrhage Mother     Hypertension Mother    Heart failure Father    Schizophrenia Daughter    Rheum arthritis Maternal Grandmother    Kidney disease Maternal Grandmother    Heart disease Paternal Grandmother    Stroke Paternal Grandmother    Stroke Paternal Grandfather    Heart disease Paternal Grandfather     Past medical history, surgical history, medications, allergies, family history and social history reviewed with patient today and changes made to appropriate areas of the chart.   Review of Systems  Constitutional: Negative.   HENT: Negative.   Eyes: Negative.   Respiratory: Positive for cough. Negative for hemoptysis, sputum production, shortness of breath and wheezing.   Cardiovascular: Positive for chest pain. Negative for palpitations, orthopnea, claudication, leg swelling and PND.  Skin: Negative.   Neurological: Negative.   Psychiatric/Behavioral: Positive for depression. Negative for hallucinations, memory loss, substance abuse and suicidal ideas. The patient is nervous/anxious. The patient does not have insomnia.     All other ROS negative except what is listed above and in the HPI.      Objective:    BP (!) 95/64 (BP Location: Left Arm, Patient Position: Sitting, Cuff Size: Small)    Pulse (!) 136    Temp 98.7 F (37.1 C) (Oral)    Ht 5' 2.21" (1.58 m)    Wt 119 lb 9.6 oz (54.3 kg)    SpO2 96%    BMI 21.73 kg/m   Wt Readings from Last 3 Encounters:  03/09/20 119 lb 9.6 oz (54.3 kg)  02/25/20 120 lb (54.4 kg)  02/03/20 120 lb (54.4 kg)    Physical Exam  Results for orders placed or performed during the hospital encounter of 02/03/20  BUN  Result Value Ref Range   BUN 11 8 - 23 mg/dL  Creatinine, serum  Result Value Ref Range   Creatinine, Ser 0.76 0.44 - 1.00 mg/dL   GFR calc non Af Amer >60 >60 mL/min   GFR calc Af Amer >60 >60 mL/min      Assessment & Plan:   Problem List Items Addressed This Visit    None       Follow up plan: No follow-ups on  file.   LABORATORY TESTING:  - Pap smear: {Blank MGQQPY:19509::"TOI done","not applicable","up to date","done elsewhere"}  IMMUNIZATIONS:   - Tdap: Tetanus vaccination status reviewed: {tetanus status:315746}. - Influenza: {Blank single:19197::"Up to date","Administered today","Postponed to flu season","Refused","Given elsewhere"} - Pneumovax: {Blank single:19197::"Up to date","Administered today","Not applicable","Refused","Given elsewhere"} - Prevnar: {Blank single:19197::"Up to date","Administered today","Not applicable","Refused","Given elsewhere"} - HPV: {Blank single:19197::"Up to date","Administered today","Not applicable","Refused","Given elsewhere"} - Zostavax vaccine: {Blank single:19197::"Up to date","Administered today","Not applicable","Refused","Given elsewhere"}  SCREENING: -Mammogram: {Blank single:19197::"Up to date","Ordered today","Not applicable","Refused","Done elsewhere"}  - Colonoscopy: {Blank single:19197::"Up to date","Ordered today","Not applicable","Refused","Done elsewhere"}  - Bone Density: {Blank single:19197::"Up to date","Ordered today","Not applicable","Refused","Done elsewhere"}  -Hearing Test: {Blank single:19197::"Up to date","Ordered today","Not applicable","Refused","Done elsewhere"}  -Spirometry: {Blank single:19197::"Up to date","Ordered today","Not applicable","Refused","Done elsewhere"}   PATIENT COUNSELING:   Advised to take 1 mg of folate supplement per day if capable of pregnancy.   Sexuality: Discussed sexually transmitted diseases, partner selection, use of condoms, avoidance of unintended pregnancy  and contraceptive alternatives.   Advised to avoid cigarette smoking.  I discussed with the patient that most people either abstain from alcohol or drink within safe limits (<=  14/week and <=4 drinks/occasion for males, <=7/weeks and <= 3 drinks/occasion for females) and that the risk for alcohol disorders and other health effects rises  proportionally with the number of drinks per week and how often a drinker exceeds daily limits.  Discussed cessation/primary prevention of drug use and availability of treatment for abuse.   Diet: Encouraged to adjust caloric intake to maintain  or achieve ideal body weight, to reduce intake of dietary saturated fat and total fat, to limit sodium intake by avoiding high sodium foods and not adding table salt, and to maintain adequate dietary potassium and calcium preferably from fresh fruits, vegetables, and low-fat dairy products.    stressed the importance of regular exercise  Injury prevention: Discussed safety belts, safety helmets, smoke detector, smoking near bedding or upholstery.   Dental health: Discussed importance of regular tooth brushing, flossing, and dental visits.    NEXT PREVENTATIVE PHYSICAL DUE IN 1 YEAR. No follow-ups on file.

## 2020-03-09 NOTE — Assessment & Plan Note (Signed)
Not doing well due to social stressors. Continue current regimen. Continue to monitor. Call with any concerns.

## 2020-03-09 NOTE — Assessment & Plan Note (Signed)
Refuses statin. Rehcecking labs today. Await results. Treat as needed.

## 2020-03-10 LAB — CBC WITH DIFFERENTIAL/PLATELET
Basophils Absolute: 0.1 10*3/uL (ref 0.0–0.2)
Basos: 1 %
EOS (ABSOLUTE): 0.3 10*3/uL (ref 0.0–0.4)
Eos: 2 %
Hematocrit: 39.8 % (ref 34.0–46.6)
Hemoglobin: 13.3 g/dL (ref 11.1–15.9)
Immature Grans (Abs): 0 10*3/uL (ref 0.0–0.1)
Immature Granulocytes: 0 %
Lymphocytes Absolute: 2.9 10*3/uL (ref 0.7–3.1)
Lymphs: 21 %
MCH: 31.1 pg (ref 26.6–33.0)
MCHC: 33.4 g/dL (ref 31.5–35.7)
MCV: 93 fL (ref 79–97)
Monocytes Absolute: 0.8 10*3/uL (ref 0.1–0.9)
Monocytes: 6 %
Neutrophils Absolute: 9.8 10*3/uL — ABNORMAL HIGH (ref 1.4–7.0)
Neutrophils: 70 %
Platelets: 446 10*3/uL (ref 150–450)
RBC: 4.27 x10E6/uL (ref 3.77–5.28)
RDW: 12.9 % (ref 11.7–15.4)
WBC: 13.9 10*3/uL — ABNORMAL HIGH (ref 3.4–10.8)

## 2020-03-10 LAB — COMPREHENSIVE METABOLIC PANEL
ALT: 15 IU/L (ref 0–32)
AST: 18 IU/L (ref 0–40)
Albumin/Globulin Ratio: 2 (ref 1.2–2.2)
Albumin: 4.5 g/dL (ref 3.7–4.7)
Alkaline Phosphatase: 100 IU/L (ref 48–121)
BUN/Creatinine Ratio: 17 (ref 12–28)
BUN: 15 mg/dL (ref 8–27)
Bilirubin Total: <0.2 mg/dL (ref 0.0–1.2)
CO2: 24 mmol/L (ref 20–29)
Calcium: 9.4 mg/dL (ref 8.7–10.3)
Chloride: 97 mmol/L (ref 96–106)
Creatinine, Ser: 0.9 mg/dL (ref 0.57–1.00)
GFR calc Af Amer: 70 mL/min/{1.73_m2} (ref >59)
GFR calc non Af Amer: 61 mL/min/{1.73_m2} (ref >59)
Globulin, Total: 2.2 g/dL (ref 1.5–4.5)
Glucose: 88 mg/dL (ref 65–99)
Potassium: 3.9 mmol/L (ref 3.5–5.2)
Sodium: 143 mmol/L (ref 134–144)
Total Protein: 6.7 g/dL (ref 6.0–8.5)

## 2020-03-10 LAB — LIPID PANEL W/O CHOL/HDL RATIO
Cholesterol, Total: 158 mg/dL (ref 100–199)
HDL: 47 mg/dL (ref >39)
LDL Chol Calc (NIH): 84 mg/dL (ref 0–99)
Triglycerides: 159 mg/dL — ABNORMAL HIGH (ref 0–149)
VLDL Cholesterol Cal: 27 mg/dL (ref 5–40)

## 2020-03-10 LAB — TSH: TSH: 5.78 u[IU]/mL — ABNORMAL HIGH (ref 0.450–4.500)

## 2020-03-10 LAB — HEPATITIS C ANTIBODY: Hep C Virus Ab: <0.1 s/co ratio (ref 0.0–0.9)

## 2020-03-23 DIAGNOSIS — Z86018 Personal history of other benign neoplasm: Secondary | ICD-10-CM | POA: Diagnosis not present

## 2020-03-23 DIAGNOSIS — D2261 Melanocytic nevi of right upper limb, including shoulder: Secondary | ICD-10-CM | POA: Diagnosis not present

## 2020-03-23 DIAGNOSIS — L578 Other skin changes due to chronic exposure to nonionizing radiation: Secondary | ICD-10-CM | POA: Diagnosis not present

## 2020-03-23 DIAGNOSIS — L570 Actinic keratosis: Secondary | ICD-10-CM | POA: Diagnosis not present

## 2020-03-23 DIAGNOSIS — L814 Other melanin hyperpigmentation: Secondary | ICD-10-CM | POA: Diagnosis not present

## 2020-03-23 DIAGNOSIS — L821 Other seborrheic keratosis: Secondary | ICD-10-CM | POA: Diagnosis not present

## 2020-03-23 DIAGNOSIS — D485 Neoplasm of uncertain behavior of skin: Secondary | ICD-10-CM | POA: Diagnosis not present

## 2020-04-03 DIAGNOSIS — Z961 Presence of intraocular lens: Secondary | ICD-10-CM | POA: Diagnosis not present

## 2020-04-09 ENCOUNTER — Encounter: Payer: Self-pay | Admitting: Family Medicine

## 2020-04-09 ENCOUNTER — Ambulatory Visit (INDEPENDENT_AMBULATORY_CARE_PROVIDER_SITE_OTHER): Payer: Medicare Other | Admitting: Family Medicine

## 2020-04-09 ENCOUNTER — Other Ambulatory Visit: Payer: Self-pay

## 2020-04-09 VITALS — BP 134/84 | HR 129 | Temp 98.7°F | Wt 119.6 lb

## 2020-04-09 DIAGNOSIS — D72829 Elevated white blood cell count, unspecified: Secondary | ICD-10-CM

## 2020-04-09 DIAGNOSIS — I10 Essential (primary) hypertension: Secondary | ICD-10-CM | POA: Diagnosis not present

## 2020-04-09 DIAGNOSIS — R7989 Other specified abnormal findings of blood chemistry: Secondary | ICD-10-CM

## 2020-04-09 DIAGNOSIS — I6523 Occlusion and stenosis of bilateral carotid arteries: Secondary | ICD-10-CM

## 2020-04-09 DIAGNOSIS — R42 Dizziness and giddiness: Secondary | ICD-10-CM | POA: Diagnosis not present

## 2020-04-09 NOTE — Assessment & Plan Note (Signed)
Doing better on current dose of medicine. Continue to monitor. Call with any concerns. Refills given today.

## 2020-04-09 NOTE — Patient Instructions (Signed)

## 2020-04-09 NOTE — Progress Notes (Signed)
BP 134/84 (BP Location: Left Arm, Patient Position: Sitting, Cuff Size: Normal)   Pulse (!) 129   Temp 98.7 F (37.1 C) (Oral)   Wt 119 lb 9.6 oz (54.3 kg)   SpO2 95%   BMI 21.73 kg/m    Subjective:    Patient ID: Savannah Mcclure, female    DOB: 05-18-1941, 79 y.o.   MRN: [id number]  HPI: Savannah Mcclure is a 79 y.o. female  Chief Complaint  Patient presents with  . Blood Pressure Check  . elevated white blood count  . Thyroid Problem   HYPERTENSION Hypertension status: better  Satisfied with current treatment? yes Duration of hypertension: chronic BP monitoring frequency:  not checking BP range:  BP medication side effects:  no Medication compliance: excellent compliance Previous BP meds:benazepril Aspirin: no Recurrent headaches: no Visual changes: no Palpitations: no Dyspnea: no Chest pain: no Lower extremity edema: no Dizzy/lightheaded: yes  DIZZINESS Duration: couple of weeks Description of symptoms: room spinning Duration of episode: minutes Dizziness frequency: recurrent Provoking factors: none Aggravating factors:  none Triggered by rolling over in bed: no Triggered by bending over: no Aggravated by head movement: yes Aggravated by exertion, coughing, loud noises: no Recent head injury: no Recent or current viral symptoms: no History of vasovagal episodes: no Nausea: no Vomiting: no Tinnitus: no Hearing loss: no Aural fullness: no Headache: no Photophobia/phonophobia: no Unsteady gait: no Postural instability: yes Diplopia, dysarthria, dysphagia or weakness: no Related to exertion: no Pallor: no Diaphoresis: no Dyspnea: no Chest pain: no   Relevant past medical, surgical, family and social history reviewed and updated as indicated. Interim medical history since our last visit reviewed. Allergies and medications reviewed and updated.  Review of Systems  Constitutional: Negative.   Respiratory: Negative.     Cardiovascular: Negative.   Gastrointestinal: Negative.   Musculoskeletal: Negative.   Neurological: Positive for dizziness. Negative for tremors, seizures, syncope, facial asymmetry, speech difficulty, weakness, light-headedness, numbness and headaches.  Psychiatric/Behavioral: Negative.     Per HPI unless specifically indicated above     Objective:    BP 134/84 (BP Location: Left Arm, Patient Position: Sitting, Cuff Size: Normal)   Pulse (!) 129   Temp 98.7 F (37.1 C) (Oral)   Wt 119 lb 9.6 oz (54.3 kg)   SpO2 95%   BMI 21.73 kg/m   Wt Readings from Last 3 Encounters:  04/09/20 119 lb 9.6 oz (54.3 kg)  03/09/20 119 lb 9.6 oz (54.3 kg)  02/25/20 120 lb (54.4 kg)    Physical Exam Vitals and nursing note reviewed.  Constitutional:      General: She is not in acute distress.    Appearance: Normal appearance. She is not ill-appearing, toxic-appearing or diaphoretic.  HENT:     Head: Normocephalic and atraumatic.     Right Ear: External ear normal.     Left Ear: External ear normal.     Nose: Nose normal.     Mouth/Throat:     Mouth: Mucous membranes are moist.     Pharynx: Oropharynx is clear.  Eyes:     General: No scleral icterus.       Right eye: No discharge.        Left eye: No discharge.     Extraocular Movements: Extraocular movements intact.     Left eye: Nystagmus present.     Conjunctiva/sclera: Conjunctivae normal.     Pupils: Pupils are equal, round, and reactive to light.  Cardiovascular:  Rate and Rhythm: Normal rate and regular rhythm.     Pulses: Normal pulses.     Heart sounds: Normal heart sounds. No murmur heard.  No friction rub. No gallop.   Pulmonary:     Effort: Pulmonary effort is normal. No respiratory distress.     Breath sounds: Normal breath sounds. No stridor. No wheezing, rhonchi or rales.  Chest:     Chest wall: No tenderness.  Musculoskeletal:        General: Normal range of motion.     Cervical back: Normal range of motion  and neck supple.  Skin:    General: Skin is warm and dry.     Capillary Refill: Capillary refill takes less than 2 seconds.     Coloration: Skin is not jaundiced or pale.     Findings: No bruising, erythema, lesion or rash.  Neurological:     General: No focal deficit present.     Mental Status: She is alert and oriented to person, place, and time. Mental status is at baseline.  Psychiatric:        Mood and Affect: Mood normal.        Behavior: Behavior normal.        Thought Content: Thought content normal.        Judgment: Judgment normal.     Results for orders placed or performed in visit on 03/09/20  Microscopic Examination   Urine  Result Value Ref Range   WBC, UA 0-5 0 - 5 /hpf   RBC None seen 0 - 2 /hpf   Epithelial Cells (non renal) 0-10 0 - 10 /hpf   Bacteria, UA Moderate (A) None seen/Few  CBC with Differential/Platelet  Result Value Ref Range   WBC 13.9 (H) 3.4 - 10.8 x10E3/uL   RBC 4.27 3.77 - 5.28 x10E6/uL   Hemoglobin 13.3 11.1 - 15.9 g/dL   Hematocrit 39.8 34.0 - 46.6 %   MCV 93 79 - 97 fL   MCH 31.1 26.6 - 33.0 pg   MCHC 33.4 31 - 35 g/dL   RDW 12.9 11.7 - 15.4 %   Platelets 446 150 - 450 x10E3/uL   Neutrophils 70 Not Estab. %   Lymphs 21 Not Estab. %   Monocytes 6 Not Estab. %   Eos 2 Not Estab. %   Basos 1 Not Estab. %   Neutrophils Absolute 9.8 (H) 1 - 7 x10E3/uL   Lymphocytes Absolute 2.9 0 - 3 x10E3/uL   Monocytes Absolute 0.8 0 - 0 x10E3/uL   EOS (ABSOLUTE) 0.3 0.0 - 0.4 x10E3/uL   Basophils Absolute 0.1 0 - 0 x10E3/uL   Immature Granulocytes 0 Not Estab. %   Immature Grans (Abs) 0.0 0.0 - 0.1 x10E3/uL  Comprehensive metabolic panel  Result Value Ref Range   Glucose 88 65 - 99 mg/dL   BUN 15 8 - 27 mg/dL   Creatinine, Ser 0.90 0.57 - 1.00 mg/dL   GFR calc non Af Amer 61 >59 mL/min/1.73   GFR calc Af Amer 70 >59 mL/min/1.73   BUN/Creatinine Ratio 17 12 - 28   Sodium 143 134 - 144 mmol/L   Potassium 3.9 3.5 - 5.2 mmol/L   Chloride 97 96 -  106 mmol/L   CO2 24 20 - 29 mmol/L   Calcium 9.4 8.7 - 10.3 mg/dL   Total Protein 6.7 6.0 - 8.5 g/dL   Albumin 4.5 3.7 - 4.7 g/dL   Globulin, Total 2.2 1.5 - 4.5 g/dL   Albumin/Globulin  Ratio 2.0 1.2 - 2.2   Bilirubin Total <0.2 0.0 - 1.2 mg/dL   Alkaline Phosphatase 100 48 - 121 IU/L   AST 18 0 - 40 IU/L   ALT 15 0 - 32 IU/L  Lipid Panel w/o Chol/HDL Ratio  Result Value Ref Range   Cholesterol, Total 158 100 - 199 mg/dL   Triglycerides 159 (H) 0 - 149 mg/dL   HDL 47 >39 mg/dL   VLDL Cholesterol Cal 27 5 - 40 mg/dL   LDL Chol Calc (NIH) 84 0 - 99 mg/dL  Microalbumin, Urine Waived  Result Value Ref Range   Microalb, Ur Waived 30 (H) 0 - 19 mg/L   Creatinine, Urine Waived 200 10 - 300 mg/dL   Microalb/Creat Ratio <30 <30 mg/g  TSH  Result Value Ref Range   TSH 5.780 (H) 0.450 - 4.500 uIU/mL  Urinalysis, Routine w reflex microscopic  Result Value Ref Range   Specific Gravity, UA 1.015 1.005 - 1.030   pH, UA 5.0 5.0 - 7.5   Color, UA Yellow Yellow   Appearance Ur Clear Clear   Leukocytes,UA Trace (A) Negative   Protein,UA Negative Negative/Trace   Glucose, UA Negative Negative   Ketones, UA Trace (A) Negative   RBC, UA Negative Negative   Bilirubin, UA Negative Negative   Urobilinogen, Ur 0.2 0.2 - 1.0 mg/dL   Nitrite, UA Negative Negative   Microscopic Examination See below:   Hepatitis C Antibody  Result Value Ref Range   Hep C Virus Ab <0.1 0.0 - 0.9 s/co ratio      Assessment & Plan:   Problem List Items Addressed This Visit      Cardiovascular and Mediastinum   HTN (hypertension) - Primary    Doing better on current dose of medicine. Continue to monitor. Call with any concerns. Refills given today.       Relevant Orders   TSH   Basic metabolic panel    Other Visit Diagnoses    Abnormal thyroid blood test       Rechecking labs today. Await results. Treat as needed.    Relevant Orders   TSH   Leukocytosis, unspecified type       Rechecking labs today.  Await results. Treat as needed.    Relevant Orders   CBC with Differential/Platelet   Vertigo       Will treat with epley's manuver. Call with any concerns. Continue to monitor.        Follow up plan: Return in about 5 months (around 09/09/2020).

## 2020-04-10 LAB — CBC WITH DIFFERENTIAL/PLATELET
Basophils Absolute: 0 10*3/uL (ref 0.0–0.2)
Basos: 0 %
EOS (ABSOLUTE): 0.2 10*3/uL (ref 0.0–0.4)
Eos: 2 %
Hematocrit: 39.3 % (ref 34.0–46.6)
Hemoglobin: 12.9 g/dL (ref 11.1–15.9)
Immature Grans (Abs): 0 10*3/uL (ref 0.0–0.1)
Immature Granulocytes: 0 %
Lymphocytes Absolute: 2.9 10*3/uL (ref 0.7–3.1)
Lymphs: 24 %
MCH: 30.6 pg (ref 26.6–33.0)
MCHC: 32.8 g/dL (ref 31.5–35.7)
MCV: 93 fL (ref 79–97)
Monocytes Absolute: 0.8 10*3/uL (ref 0.1–0.9)
Monocytes: 6 %
Neutrophils Absolute: 8.1 10*3/uL — ABNORMAL HIGH (ref 1.4–7.0)
Neutrophils: 68 %
Platelets: 431 10*3/uL (ref 150–450)
RBC: 4.21 x10E6/uL (ref 3.77–5.28)
RDW: 12.9 % (ref 11.7–15.4)
WBC: 12.1 10*3/uL — ABNORMAL HIGH (ref 3.4–10.8)

## 2020-04-10 LAB — BASIC METABOLIC PANEL
BUN/Creatinine Ratio: 11 — ABNORMAL LOW (ref 12–28)
BUN: 8 mg/dL (ref 8–27)
CO2: 25 mmol/L (ref 20–29)
Calcium: 9.3 mg/dL (ref 8.7–10.3)
Chloride: 102 mmol/L (ref 96–106)
Creatinine, Ser: 0.75 mg/dL (ref 0.57–1.00)
GFR calc Af Amer: 88 mL/min/{1.73_m2} (ref >59)
GFR calc non Af Amer: 76 mL/min/{1.73_m2} (ref >59)
Glucose: 86 mg/dL (ref 65–99)
Potassium: 3.7 mmol/L (ref 3.5–5.2)
Sodium: 142 mmol/L (ref 134–144)

## 2020-04-10 LAB — TSH: TSH: 8 u[IU]/mL — ABNORMAL HIGH (ref 0.450–4.500)

## 2020-04-13 ENCOUNTER — Other Ambulatory Visit: Payer: Self-pay | Admitting: Family Medicine

## 2020-04-13 DIAGNOSIS — E039 Hypothyroidism, unspecified: Secondary | ICD-10-CM

## 2020-04-13 MED ORDER — LEVOTHYROXINE SODIUM 25 MCG PO TABS: 25.0000 ug | ORAL_TABLET | Freq: Every day | ORAL | 1 refills | Status: DC

## 2020-04-28 ENCOUNTER — Other Ambulatory Visit: Payer: Self-pay

## 2020-04-28 ENCOUNTER — Encounter: Payer: Self-pay | Admitting: Student in an Organized Health Care Education/Training Program

## 2020-04-28 ENCOUNTER — Ambulatory Visit
Payer: Medicare Other | Attending: Student in an Organized Health Care Education/Training Program | Admitting: Student in an Organized Health Care Education/Training Program

## 2020-04-28 VITALS — BP 166/83 | HR 123 | Temp 97.7°F | Resp 16 | Ht 61.0 in | Wt 118.0 lb

## 2020-04-28 DIAGNOSIS — M48062 Spinal stenosis, lumbar region with neurogenic claudication: Secondary | ICD-10-CM | POA: Diagnosis present

## 2020-04-28 DIAGNOSIS — G894 Chronic pain syndrome: Secondary | ICD-10-CM

## 2020-04-28 DIAGNOSIS — M4120 Other idiopathic scoliosis, site unspecified: Secondary | ICD-10-CM

## 2020-04-28 DIAGNOSIS — M47814 Spondylosis without myelopathy or radiculopathy, thoracic region: Secondary | ICD-10-CM | POA: Insufficient documentation

## 2020-04-28 DIAGNOSIS — F1190 Opioid use, unspecified, uncomplicated: Secondary | ICD-10-CM | POA: Diagnosis present

## 2020-04-28 DIAGNOSIS — M5136 Other intervertebral disc degeneration, lumbar region: Secondary | ICD-10-CM | POA: Diagnosis not present

## 2020-04-28 DIAGNOSIS — M47816 Spondylosis without myelopathy or radiculopathy, lumbar region: Secondary | ICD-10-CM | POA: Diagnosis not present

## 2020-04-28 MED ORDER — TRAMADOL HCL 50 MG PO TABS: 50.0000 mg | ORAL_TABLET | Freq: Two times a day (BID) | ORAL | 1 refills | Status: AC

## 2020-04-28 NOTE — Progress Notes (Signed)
PROVIDER NOTE: Information contained herein reflects review and annotations entered in association with encounter. Interpretation of such information and data should be left to medically-trained personnel. Information provided to patient can be located elsewhere in the medical record under "Patient Instructions". Document created using STT-dictation technology, any transcriptional errors that may result from process are unintentional.    Patient: Savannah Mcclure  Service Category: E/M  Provider:  , MD  DOB: 09/07/1940  DOS: 04/28/2020  Specialty: Interventional Pain Management  MRN: [id number]  Setting: Ambulatory outpatient  PCP: Johnson, Megan P, DO  Type: Established Patient    Referring Provider: Johnson, Megan P, DO  Location: Office  Delivery: Face-to-face     HPI  Reason for encounter: Savannah Mcclure, a 79 y.o. year old female, is here today for evaluation and management of her Scoliosis (and kyphoscoliosis), idiopathic [M41.20]. [name]'s primary complain today is Back Pain (lower) Last encounter: Practice (01/30/2020). My last encounter with her was on 01/30/2020. Pertinent problems: [name] has HTN (hypertension); COPD (chronic obstructive pulmonary disease) (HCC); Scoliosis (and kyphoscoliosis), idiopathic; Spinal stenosis of lumbar region with neurogenic claudication; PAD (peripheral artery disease) (HCC); Chronic left shoulder pain; Pain of both hip joints; Chronic pain syndrome; Spondylosis without myelopathy or radiculopathy, lumbar region; and Thoracic spondylosis without myelopathy on their pertinent problem list. Pain Assessment: Severity of Chronic pain is reported as a 3 /10. Location: Back Lower/denies. Onset: More than a month ago. Quality: Burning, Aching, Shooting, Stabbing. Timing: Constant. Modifying factor(s): medication, lidocain patches. Vitals:  height is 5' 1" (1.549 m) and weight is 118 lb (53.5 kg). Her temporal  temperature is 97.7 F (36.5 C). Her blood pressure is 166/83 (abnormal) and her pulse is 123 (abnormal). Her respiration is 16 and oxygen saturation is 100%.   Patient was diagnosed with hypothyroidism and has started thyroid supplementation.  She has been doing this for the last month and does endorse a mild improvement in her fatigue and her energy level.  Patient presents today for medication management.  She has tried Butrans patch in the past as well as oxycodone.  Butrans patch was not effective and resulted in cognitive side effects.  Oxycodone causes her to break out in significant hives.  She has found benefit with tramadol in the past so we will restart that medication to be taking on an as-needed basis, 5200 mg twice daily as needed for chronic pain management.  Of note, patient states that she does have a daughter that has a history of substance abuse.  Patient confirms that she utilizes a lock box to store her medications in her house.  We discussed safe storage practices so that her medications are only taken by her.  Pharmacotherapy Assessment   Analgesic: Tramadol 50 mg to 100 mg twice daily as needed, quantity 120/month  Monitoring: [place] PMP: PDMP not reviewed this encounter.       Pharmacotherapy: No side-effects or adverse reactions reported. Compliance: No problems identified. Effectiveness: Clinically acceptable.  No notes on file  UDS:  Summary  Date Value Ref Range Status  04/24/2019 Note  Final    Comment:    ==================================================================== [employer] Select 13 (MW) ==================================================================== Test                             Result       Flag       Units Drug Absent but Declared for Prescription Verification   Tramadol                         Not Detected UNEXPECTED ng/mg creat ==================================================================== Test                      Result    Flag    Units      Ref Range   Creatinine              53               mg/dL      >=20 ==================================================================== Declared Medications:  The flagging and interpretation on this report are based on the  following declared medications.  Unexpected results may arise from  inaccuracies in the declared medications.  **Note: The testing scope of this panel includes these medications:  Tramadol  **Note: The testing scope of this panel does not include the  following reported medications:  Albuterol (Duoneb)  Benazepril  Cefuroxime  Cilostazol (Pletal)  Cyclobenzaprine  Gabapentin  Hydroxyzine  Ipratropium (Duoneb)  Magnesium  Montelukast  Multivitamin  Niacinamide  Omeprazole  Potassium  Supplement  Tiotropium  Topical Lidocaine  Vitamin B ==================================================================== For clinical consultation, please call [phone]. ====================================================================      ROS  Constitutional: Denies any fever or chills Gastrointestinal: No reported hemesis, hematochezia, vomiting, or acute GI distress Musculoskeletal: Denies any acute onset joint swelling, redness, loss of ROM, or weakness Neurological: No reported episodes of acute onset apraxia, aphasia, dysarthria, agnosia, amnesia, paralysis, loss of coordination, or loss of consciousness  Medication Review  B Complex-C, Difluprednate, Misc Natural Products, Niacinamide, UNABLE TO FIND, acetaminophen, atorvastatin, benazepril, cilostazol, cyclobenzaprine, diphenhydrAMINE, gabapentin, hydrOXYzine, levothyroxine, lidocaine, montelukast, multivitamin with minerals, omeprazole, tiotropium, and traMADol  History Review  Allergy: [name] is allergic to tramadol, other, oxycodone, amlodipine, augmentin [amoxicillin-pot clavulanate], azithromycin, contrast media [iodinated diagnostic agents], doxycycline, erythromycin,  hydrocodone, lactose intolerance (gi), minocycline, nsaids, relafen [nabumetone], sulfa antibiotics, and tetracyclines & related. Drug: [name]  reports no history of drug use. Alcohol:  reports current alcohol use. Tobacco:  reports that she has been smoking cigarettes. She has a 15.00 pack-year smoking history. She has never used smokeless tobacco. Social: Ms. Eckroth  reports that she has been smoking cigarettes. She has a 15.00 pack-year smoking history. She has never used smokeless tobacco. She reports current alcohol use. She reports that she does not use drugs. Medical:  has a past medical history of Abdominal distension (03/18/2012), Abnormal weight loss (10/30/2017), Acute kidney injury (HCC) (03/22/2012), Aneurysm (HCC), Anxiety, Blind right eye, Cardiac arrest (HCC), Cataract, Claudication (HCC), Complication of anesthesia (03/15/2012), COPD (chronic obstructive pulmonary disease) (HCC), Delirium (03/18/2012), Dyspnea, GERD (gastroesophageal reflux disease), Heart murmur, Hypertension, Leaky heart valve, Oxygen deficit, Peripheral vascular disease (HCC), Scoliosis, Sepsis (HCC), Shock (HCC) (03/23/2012), SIRS (systemic inflammatory response syndrome) (HCC) (03/23/2012), Spinal stenosis, Stroke (HCC), and Tachycardia. Surgical: Ms. Ebright  has a past surgical history that includes right eye surgery; Wisdom tooth extraction; Back surgery; Abdominal hysterectomy; Breast lumpectomy (Left); Lower Extremity Angiography (Right, 06/20/2018); Cataract extraction w/PHACO (Left, 08/14/2019); and Lower Extremity Angiography (Left, 02/03/2020). Family: family history includes Heart disease in her paternal grandfather and paternal grandmother; Heart failure in her father; Hypertension in her mother; Intracerebral hemorrhage in her mother; Kidney disease in her maternal grandmother; Rheum arthritis in her maternal grandmother; Schizophrenia in her daughter; Stroke in her paternal grandfather and  paternal grandmother.  Laboratory Chemistry Profile   Renal Lab Results  Component Value Date   BUN 8 04/09/2020   CREATININE 0.75 04/09/2020   BCR 11 (L) 04/09/2020   GFRAA 88   04/09/2020   GFRNONAA 76 04/09/2020     Hepatic Lab Results  Component Value Date   AST 18 03/09/2020   ALT 15 03/09/2020   ALBUMIN 4.5 03/09/2020   ALKPHOS 100 03/09/2020     Electrolytes Lab Results  Component Value Date   NA 142 04/09/2020   K 3.7 04/09/2020   CL 102 04/09/2020   CALCIUM 9.3 04/09/2020   MG 1.8 10/13/2017   PHOS 3.2 10/13/2017     Bone No results found for: VD25OH, VD125OH2TOT, VD3125OH2, VD2125OH2, 25OHVITD1, 25OHVITD2, 25OHVITD3, TESTOFREE, TESTOSTERONE   Inflammation (CRP: Acute Phase) (ESR: Chronic Phase) Lab Results  Component Value Date   LATICACIDVEN 1.8 10/09/2017       Note: Above Lab results reviewed.  Recent Imaging Review  VAS US ABI WITH/WO TBI LOWER EXTREMITY DOPPLER STUDY  Indications: Peripheral artery disease, and Aorta occlusion              Bilat CIA, EIA, SFA occlusions with bilat PFA's providing              collateral flow.   Vascular Interventions: 02/03/2020 left PTA stent of EIA/CIA                         Patient states a history of right leg angioplasty.  Comparison Study: 01/24/2020  Performing Technologist: Terry Knight RVT    Examination Guidelines: A complete evaluation includes at minimum, Doppler waveform signals and systolic blood pressure reading at the level of bilateral brachial, anterior tibial, and posterior tibial arteries, when vessel segments are accessible. Bilateral testing is considered an integral part of a complete examination. Photoelectric Plethysmograph (PPG) waveforms and toe systolic pressure readings are included as required and additional duplex testing as needed. Limited examinations for reoccurring indications may be performed as noted.    ABI  Findings: +---------+------------------+-----+--------+--------+ Right    Rt Pressure (mmHg)IndexWaveformComment  +---------+------------------+-----+--------+--------+ Brachial 136                                     +---------+------------------+-----+--------+--------+ ATA      118               0.87 biphasic         +---------+------------------+-----+--------+--------+ PTA      108               0.79 biphasic         +---------+------------------+-----+--------+--------+ Great Toe94                0.69 Normal           +---------+------------------+-----+--------+--------+  +---------+------------------+-----+---------+-------+ Left     Lt Pressure (mmHg)IndexWaveform Comment +---------+------------------+-----+---------+-------+ ATA      122               0.90 triphasic        +---------+------------------+-----+---------+-------+ PTA      107               0.79 biphasic         +---------+------------------+-----+---------+-------+ Great Toe93                0.68 Normal           +---------+------------------+-----+---------+-------+  +-------+-----------+-----------+------------+------------+ ABI/TBIToday's ABIToday's TBIPrevious ABIPrevious TBI +-------+-----------+-----------+------------+------------+ Right  .87        .69        .68         .  55          +-------+-----------+-----------+------------+------------+ Left   .90        .68        .64         .47          +-------+-----------+-----------+------------+------------+  Bilateral ABIs and TBIs appear increased compared to prior study on 01/24/2020.   Summary: Right: Resting right ankle-brachial index indicates mild right lower extremity arterial disease. The right toe-brachial index is normal.  Left: Resting left ankle-brachial index indicates mild left lower extremity arterial disease. The left toe-brachial index is normal.    *See table(s)  above for measurements and observations.    Electronically signed by Leotis Pain MD on 03/06/2020 at 8:33:38 AM.       Final   Note: Reviewed        Physical Exam  General appearance: Well nourished, well developed, and well hydrated. In no apparent acute distress Mental status: Alert, oriented x 3 (person, place, & time)       Respiratory: No evidence of acute respiratory distress  Vitals: BP (!) 166/83 (BP Location: Right Arm, Patient Position: Sitting, Cuff Size: Normal)   Pulse (!) 123   Temp 97.7 F (36.5 C) (Temporal)   Resp 16   Ht 5' 1" (1.549 m)   Wt 118 lb (53.5 kg)   SpO2 100%   BMI 22.30 kg/m  BMI: Estimated body mass index is 22.3 kg/m as calculated from the following:   Height as of this encounter: 5' 1" (1.549 m).   Weight as of this encounter: 118 lb (53.5 kg). Ideal: Ideal body weight: 47.8 kg (105 lb 6.1 oz) Adjusted ideal body weight: 50.1 kg (110 lb 6.8 oz)   Lumbar Spine Area Exam  Skin & Axial Inspection: Thoraco-lumbar Scoliosis Alignment: Scoliosis detected Functional ROM: Pain restricted ROM       Stability: No instability detected Muscle Tone/Strength: Functionally intact. No obvious neuro-muscular anomalies detected. Sensory (Neurological): Musculoskeletal pain pattern  Gait & Posture Assessment  Ambulation: Unassisted Gait: Relatively normal for age and body habitus Posture: WNL  Lower Extremity Exam    Side: Right lower extremity  Side: Left lower extremity  Stability: No instability observed          Stability: No instability observed          Skin & Extremity Inspection: Skin color, temperature, and hair growth are WNL. No peripheral edema or cyanosis. No masses, redness, swelling, asymmetry, or associated skin lesions. No contractures.  Skin & Extremity Inspection: Skin color, temperature, and hair growth are WNL. No peripheral edema or cyanosis. No masses, redness, swelling, asymmetry, or associated skin lesions. No contractures.   Functional ROM: Pain restricted ROM for hip and knee joints          Functional ROM: Pain restricted ROM for hip and knee joints          Muscle Tone/Strength: Functionally intact. No obvious neuro-muscular anomalies detected.  Muscle Tone/Strength: Functionally intact. No obvious neuro-muscular anomalies detected.  Sensory (Neurological): Neurogenic pain pattern        Sensory (Neurological): Neurogenic pain pattern        DTR: Patellar: deferred today Achilles: deferred today Plantar: deferred today  DTR: Patellar: deferred today Achilles: deferred today Plantar: deferred today  Palpation: No palpable anomalies  Palpation: No palpable anomalies     Assessment   Status Diagnosis  Controlled Controlled Controlled 1. Scoliosis (and kyphoscoliosis), idiopathic   2. Thoracic spondylosis without myelopathy  3. Spondylosis without myelopathy or radiculopathy, lumbar region   4. Lumbar degenerative disc disease   5. Chronic, continuous use of opioids   6. Spinal stenosis of lumbar region with neurogenic claudication   7. Chronic pain syndrome        Plan of Care   Ms. Ajanay A Stamp has a current medication list which includes the following long-term medication(s): atorvastatin, benazepril, gabapentin, levothyroxine, montelukast, omeprazole, and spiriva handihaler.  Pharmacotherapy (Medications Ordered): Meds ordered this encounter  Medications  . traMADol (ULTRAM) 50 MG tablet    Sig: Take 1-2 tablets (50-100 mg total) by mouth 2 (two) times daily. Month last 30 days.    Dispense:  120 tablet    Refill:  1    [place] STOP ACT - Not applicable. Fill one day early if pharmacy is closed on scheduled refill date.   Follow-up plan:   Return if symptoms worsen or fail to improve.     Status post T11, T12, L1 RFA bilaterally on 03/04/2019, 04/2018      Recent Visits Date Type Provider Dept  01/30/20 Office Visit , , MD [facility]  Showing  recent visits within past 90 days and meeting all other requirements Today's Visits Date Type Provider Dept  04/28/20 Office Visit , , MD [facility]  Showing today's visits and meeting all other requirements Future Appointments No visits were found meeting these conditions. Showing future appointments within next 90 days and meeting all other requirements  I discussed the assessment and treatment plan with the patient. The patient was provided an opportunity to ask questions and all were answered. The patient agreed with the plan and demonstrated an understanding of the instructions.  Patient advised to call back or seek an in-person evaluation if the symptoms or condition worsens.  Duration of encounter: 30 minutes.  Note by:  , MD Date: 04/28/2020; Time: 11:34 AM 

## 2020-04-28 NOTE — Patient Instructions (Signed)
Rx for Tramadol to last until 06/27/20 has been escribed to your pharmacy.

## 2020-05-25 ENCOUNTER — Other Ambulatory Visit: Payer: Medicare Other

## 2020-05-25 ENCOUNTER — Other Ambulatory Visit: Payer: Self-pay

## 2020-05-25 DIAGNOSIS — E039 Hypothyroidism, unspecified: Secondary | ICD-10-CM | POA: Diagnosis not present

## 2020-05-26 LAB — TSH: TSH: 5.38 u[IU]/mL — ABNORMAL HIGH (ref 0.450–4.500)

## 2020-05-27 ENCOUNTER — Other Ambulatory Visit: Payer: Self-pay | Admitting: Family Medicine

## 2020-05-27 DIAGNOSIS — E039 Hypothyroidism, unspecified: Secondary | ICD-10-CM

## 2020-05-27 MED ORDER — LEVOTHYROXINE SODIUM 50 MCG PO TABS: 50.0000 ug | ORAL_TABLET | Freq: Every day | ORAL | 1 refills | Status: DC

## 2020-06-01 ENCOUNTER — Other Ambulatory Visit: Payer: Self-pay

## 2020-06-01 ENCOUNTER — Ambulatory Visit (INDEPENDENT_AMBULATORY_CARE_PROVIDER_SITE_OTHER): Payer: Medicare Other

## 2020-06-01 DIAGNOSIS — Z23 Encounter for immunization: Secondary | ICD-10-CM

## 2020-06-30 ENCOUNTER — Telehealth: Payer: Self-pay

## 2020-06-30 ENCOUNTER — Other Ambulatory Visit: Payer: Self-pay

## 2020-06-30 ENCOUNTER — Other Ambulatory Visit: Payer: Medicare Other

## 2020-06-30 DIAGNOSIS — I10 Essential (primary) hypertension: Secondary | ICD-10-CM | POA: Diagnosis not present

## 2020-06-30 DIAGNOSIS — F419 Anxiety disorder, unspecified: Secondary | ICD-10-CM | POA: Diagnosis not present

## 2020-06-30 DIAGNOSIS — E039 Hypothyroidism, unspecified: Secondary | ICD-10-CM | POA: Diagnosis not present

## 2020-06-30 DIAGNOSIS — D649 Anemia, unspecified: Secondary | ICD-10-CM

## 2020-06-30 DIAGNOSIS — J410 Simple chronic bronchitis: Secondary | ICD-10-CM | POA: Diagnosis not present

## 2020-06-30 DIAGNOSIS — I739 Peripheral vascular disease, unspecified: Secondary | ICD-10-CM | POA: Diagnosis not present

## 2020-06-30 NOTE — Telephone Encounter (Signed)
Pt presented in office to get labs done and wanting to let Dr Wynetta Emery know that the thyroid medicine she is taking has been giving her headaches and causing diarrhea. She is wondering about maybe getting on something more natural due to most chemical medicines giving her problems. Please advise.

## 2020-07-01 LAB — CBC WITH DIFFERENTIAL/PLATELET
Basophils Absolute: 0.1 10*3/uL (ref 0.0–0.2)
Basos: 1 %
EOS (ABSOLUTE): 0.3 10*3/uL (ref 0.0–0.4)
Eos: 3 %
Hematocrit: 39.7 % (ref 34.0–46.6)
Hemoglobin: 13.2 g/dL (ref 11.1–15.9)
Immature Grans (Abs): 0 10*3/uL (ref 0.0–0.1)
Immature Granulocytes: 0 %
Lymphocytes Absolute: 2.6 10*3/uL (ref 0.7–3.1)
Lymphs: 23 %
MCH: 30.3 pg (ref 26.6–33.0)
MCHC: 33.2 g/dL (ref 31.5–35.7)
MCV: 91 fL (ref 79–97)
Monocytes Absolute: 0.6 10*3/uL (ref 0.1–0.9)
Monocytes: 6 %
Neutrophils Absolute: 7.7 10*3/uL — ABNORMAL HIGH (ref 1.4–7.0)
Neutrophils: 67 %
Platelets: 483 10*3/uL — ABNORMAL HIGH (ref 150–450)
RBC: 4.36 x10E6/uL (ref 3.77–5.28)
RDW: 13.2 % (ref 11.7–15.4)
WBC: 11.3 10*3/uL — ABNORMAL HIGH (ref 3.4–10.8)

## 2020-07-01 LAB — COMPREHENSIVE METABOLIC PANEL
ALT: 7 IU/L (ref 0–32)
AST: 15 IU/L (ref 0–40)
Albumin/Globulin Ratio: 1.9 (ref 1.2–2.2)
Albumin: 4.4 g/dL (ref 3.7–4.7)
Alkaline Phosphatase: 88 IU/L (ref 44–121)
BUN/Creatinine Ratio: 9 — ABNORMAL LOW (ref 12–28)
BUN: 7 mg/dL — ABNORMAL LOW (ref 8–27)
Bilirubin Total: 0.5 mg/dL (ref 0.0–1.2)
CO2: 26 mmol/L (ref 20–29)
Calcium: 9.3 mg/dL (ref 8.7–10.3)
Chloride: 100 mmol/L (ref 96–106)
Creatinine, Ser: 0.74 mg/dL (ref 0.57–1.00)
GFR calc Af Amer: 88 mL/min/{1.73_m2} (ref >59)
GFR calc non Af Amer: 77 mL/min/{1.73_m2} (ref >59)
Globulin, Total: 2.3 g/dL (ref 1.5–4.5)
Glucose: 101 mg/dL — ABNORMAL HIGH (ref 65–99)
Potassium: 3.5 mmol/L (ref 3.5–5.2)
Sodium: 140 mmol/L (ref 134–144)
Total Protein: 6.7 g/dL (ref 6.0–8.5)

## 2020-07-01 LAB — TSH: TSH: 5.16 u[IU]/mL — ABNORMAL HIGH (ref 0.450–4.500)

## 2020-07-08 ENCOUNTER — Other Ambulatory Visit: Payer: Self-pay | Admitting: Family Medicine

## 2020-07-08 DIAGNOSIS — E039 Hypothyroidism, unspecified: Secondary | ICD-10-CM

## 2020-07-08 NOTE — Telephone Encounter (Signed)
Please let her know that her thyroid is still not where it needs to be. Because she's having so many symptoms with the medicine, I'm going to send her to endocrinology. They should be calling her.

## 2020-07-08 NOTE — Telephone Encounter (Signed)
Pt returned call/ no answer from office. /advised Pt of a call from Endo

## 2020-07-08 NOTE — Telephone Encounter (Signed)
Called and LVM asking for patient to please return my call.  

## 2020-07-13 NOTE — Telephone Encounter (Signed)
Patient notified

## 2020-08-05 ENCOUNTER — Telehealth (INDEPENDENT_AMBULATORY_CARE_PROVIDER_SITE_OTHER): Payer: Medicare Other | Admitting: Family Medicine

## 2020-08-05 ENCOUNTER — Encounter: Payer: Self-pay | Admitting: Family Medicine

## 2020-08-05 DIAGNOSIS — J320 Chronic maxillary sinusitis: Secondary | ICD-10-CM | POA: Diagnosis not present

## 2020-08-05 DIAGNOSIS — Z202 Contact with and (suspected) exposure to infections with a predominantly sexual mode of transmission: Secondary | ICD-10-CM

## 2020-08-05 DIAGNOSIS — Z114 Encounter for screening for human immunodeficiency virus [HIV]: Secondary | ICD-10-CM | POA: Diagnosis not present

## 2020-08-05 DIAGNOSIS — Z0189 Encounter for other specified special examinations: Secondary | ICD-10-CM

## 2020-08-05 DIAGNOSIS — Z113 Encounter for screening for infections with a predominantly sexual mode of transmission: Secondary | ICD-10-CM

## 2020-08-05 MED ORDER — CEFUROXIME AXETIL 250 MG PO TABS: 250.0000 mg | ORAL_TABLET | Freq: Two times a day (BID) | ORAL | 0 refills | Status: DC

## 2020-08-05 NOTE — Progress Notes (Signed)
There were no vitals taken for this visit.   Subjective:    Patient ID: Savannah Mcclure, female    DOB: 27-Jul-1941, 79 y.o.   MRN: [id number]  HPI: Savannah Mcclure is a 79 y.o. female  Chief Complaint  Patient presents with  . Sinusitis    Pt states she has sinus issues, coughs when she has a lot of congestion   . Exposure to STD    Pt states she would like to make an apt to get tested for STD's says she drank after her daughter who was diagnosed with gonorrhea.   UPPER RESPIRATORY TRACT INFECTION Duration: about a month Worst symptom: cough and congestion Fever: no Cough: yes Shortness of breath: no Wheezing: no Chest pain: no Chest tightness: no Chest congestion: no Nasal congestion: yes Runny nose: yes Post nasal drip: yes Sneezing: no Sore throat: no Swollen glands: no Sinus pressure: yes Headache: no Face pain: no Toothache: no Ear pain: no  Ear pressure: no  Eyes red/itching:no Eye drainage/crusting: no  Vomiting: no Rash: no Fatigue: yes Sick contacts: yes- daughter had pneumonia about a month ago Strep contacts: no  Context: stable Recurrent sinusitis: yes Relief with OTC cold/cough medications: yes  Treatments attempted: cold/sinus, mucinex, anti-histamine and pseudoephedrine   Daughter was diagnosed with oral gonorrhea. She drank out of her drink without telling her. She would like STI screening and specifically gonorrhea testing.   Relevant past medical, surgical, family and social history reviewed and updated as indicated. Interim medical history since our last visit reviewed. Allergies and medications reviewed and updated.  Review of Systems  Constitutional: Positive for fatigue. Negative for activity change, appetite change, chills, diaphoresis, fever and unexpected weight change.  HENT: Positive for congestion, postnasal drip, rhinorrhea, sinus pressure and sinus pain. Negative for dental problem, drooling, ear discharge, ear  pain, facial swelling, hearing loss, mouth sores, nosebleeds, sneezing, sore throat, tinnitus, trouble swallowing and voice change.   Eyes: Negative.   Respiratory: Negative.   Cardiovascular: Negative.   Gastrointestinal: Negative.   Musculoskeletal: Negative.   Neurological: Negative.   Psychiatric/Behavioral: Negative.     Per HPI unless specifically indicated above     Objective:    There were no vitals taken for this visit.  Wt Readings from Last 3 Encounters:  04/28/20 118 lb (53.5 kg)  04/09/20 119 lb 9.6 oz (54.3 kg)  03/09/20 119 lb 9.6 oz (54.3 kg)    Physical Exam Vitals and nursing note reviewed.  Pulmonary:     Effort: Pulmonary effort is normal. No respiratory distress.     Comments: Speaking in full sentences Neurological:     Mental Status: She is alert.  Psychiatric:        Mood and Affect: Mood normal.        Behavior: Behavior normal.        Thought Content: Thought content normal.        Judgment: Judgment normal.     Results for orders placed or performed in visit on 06/30/20  TSH  Result Value Ref Range   TSH 5.160 (H) 0.450 - 4.500 uIU/mL  Comprehensive metabolic panel  Result Value Ref Range   Glucose 101 (H) 65 - 99 mg/dL   BUN 7 (L) 8 - 27 mg/dL   Creatinine, Ser 0.74 0.57 - 1.00 mg/dL   GFR calc non Af Amer 77 >59 mL/min/1.73   GFR calc Af Amer 88 >59 mL/min/1.73   BUN/Creatinine Ratio 9 (L) 12 - 28  Sodium 140 134 - 144 mmol/L   Potassium 3.5 3.5 - 5.2 mmol/L   Chloride 100 96 - 106 mmol/L   CO2 26 20 - 29 mmol/L   Calcium 9.3 8.7 - 10.3 mg/dL   Total Protein 6.7 6.0 - 8.5 g/dL   Albumin 4.4 3.7 - 4.7 g/dL   Globulin, Total 2.3 1.5 - 4.5 g/dL   Albumin/Globulin Ratio 1.9 1.2 - 2.2   Bilirubin Total 0.5 0.0 - 1.2 mg/dL   Alkaline Phosphatase 88 44 - 121 IU/L   AST 15 0 - 40 IU/L   ALT 7 0 - 32 IU/L  CBC with Differential/Platelet  Result Value Ref Range   WBC 11.3 (H) 3.4 - 10.8 x10E3/uL   RBC 4.36 3.77 - 5.28 x10E6/uL    Hemoglobin 13.2 11.1 - 15.9 g/dL   Hematocrit 39.7 34.0 - 46.6 %   MCV 91 79 - 97 fL   MCH 30.3 26.6 - 33.0 pg   MCHC 33.2 31.5 - 35.7 g/dL   RDW 13.2 11.7 - 15.4 %   Platelets 483 (H) 150 - 450 x10E3/uL   Neutrophils 67 Not Estab. %   Lymphs 23 Not Estab. %   Monocytes 6 Not Estab. %   Eos 3 Not Estab. %   Basos 1 Not Estab. %   Neutrophils Absolute 7.7 (H) 1.4 - 7.0 x10E3/uL   Lymphocytes Absolute 2.6 0.7 - 3.1 x10E3/uL   Monocytes Absolute 0.6 0.1 - 0.9 x10E3/uL   EOS (ABSOLUTE) 0.3 0.0 - 0.4 x10E3/uL   Basophils Absolute 0.1 0.0 - 0.2 x10E3/uL   Immature Granulocytes 0 Not Estab. %   Immature Grans (Abs) 0.0 0.0 - 0.1 x10E3/uL      Assessment & Plan:   Problem List Items Addressed This Visit   None   Visit Diagnoses    Chronic maxillary sinusitis    -  Primary   Will treat with ceftin- has tolerated it before with allergies. Will also get her into ENT for evaluation given chronicity of symptoms and recurrances.    Relevant Medications   cefUROXime (CEFTIN) 250 MG tablet   Other Relevant Orders   Ambulatory referral to ENT   Exposure to gonorrhea       Daughter had oral gonorrhea and she shared a drink with her. Will check STIs as well as oral gonorrhea swab- orders in. Await results. Likely low risk.   Relevant Orders   GC/Chlamydia Probe Amp   Hepatitis panel, acute   RPR   HSV(herpes simplex vrs) 1+2 ab-IgG   GC NAA, Pharyngeal   Routine screening for STI (sexually transmitted infection)       Relevant Orders   GC/Chlamydia Probe Amp   Hepatitis panel, acute   RPR   HSV(herpes simplex vrs) 1+2 ab-IgG   Screening for HIV (human immunodeficiency virus)       Relevant Orders   HIV Antibody (routine testing w rflx)   Encounter for other specified special examinations        Relevant Orders   Hepatitis panel, acute       Follow up plan: Return if symptoms worsen or fail to improve.   . This visit was completed via telephone due to the restrictions of the  COVID-19 pandemic. All issues as above were discussed and addressed but no physical exam was performed. If it was felt that the patient should be evaluated in the office, they were directed there. The patient verbally consented to this visit. Patient was unable to  complete an audio/visual visit due to Lack of equipment. Due to the catastrophic nature of the COVID-19 pandemic, this visit was done through audio contact only. . Location of the patient: parking lot . Location of the provider: work . Those involved with this call:  . Provider: Park Liter, DO . CMA: Louanna Raw, [place] . Front Desk/Registration: Jill Side  . Time spent on call: 25 minutes on the phone discussing health concerns. 30 minutes total spent in review of patient's record and preparation of their chart.

## 2020-08-06 ENCOUNTER — Ambulatory Visit: Payer: Medicare Other

## 2020-08-06 ENCOUNTER — Other Ambulatory Visit: Payer: Self-pay

## 2020-08-25 ENCOUNTER — Ambulatory Visit (INDEPENDENT_AMBULATORY_CARE_PROVIDER_SITE_OTHER): Payer: Medicare Other | Admitting: Vascular Surgery

## 2020-08-25 ENCOUNTER — Other Ambulatory Visit (INDEPENDENT_AMBULATORY_CARE_PROVIDER_SITE_OTHER): Payer: Self-pay | Admitting: Vascular Surgery

## 2020-08-25 ENCOUNTER — Ambulatory Visit (INDEPENDENT_AMBULATORY_CARE_PROVIDER_SITE_OTHER): Payer: Medicare Other

## 2020-08-25 ENCOUNTER — Other Ambulatory Visit: Payer: Self-pay

## 2020-08-25 VITALS — BP 155/95 | HR 139 | Ht 63.0 in | Wt 109.0 lb

## 2020-08-25 DIAGNOSIS — I6523 Occlusion and stenosis of bilateral carotid arteries: Secondary | ICD-10-CM | POA: Diagnosis not present

## 2020-08-25 DIAGNOSIS — I70213 Atherosclerosis of native arteries of extremities with intermittent claudication, bilateral legs: Secondary | ICD-10-CM | POA: Diagnosis not present

## 2020-08-25 DIAGNOSIS — I10 Essential (primary) hypertension: Secondary | ICD-10-CM

## 2020-08-25 NOTE — Assessment & Plan Note (Signed)
ABIs today are down a bit to 0.71 on the right and 0.64 on the left but she still has strong waveforms which are biphasic on the right and monophasic on the left.  Symptoms markedly improved after revascularization last year.  Continue current medical regimen.  Recheck in 6 months.

## 2020-08-25 NOTE — Assessment & Plan Note (Signed)
Carotid duplex today reveals velocities in the 40 to 59% range bilaterally without significant progression from previous studies.  This has previously followed in the 60 to 79% range and the velocities are still borderline for this.  We will continue to follow this on 53-month intervals.

## 2020-08-25 NOTE — Progress Notes (Signed)
MRN : [id number]  Peyson [name] is a 80 y.o. ([birth year]) female who presents with chief complaint of  Chief Complaint  Patient presents with  . Follow-up  . Carotid    U/S  .  History of Present Illness: Patient returns today in follow up of multiple vascular issues.  She is doing well today.  She is having a lot of back issues but her legs have been fine and she has not had any focal neurologic symptoms.  Her claudication symptoms are markedly improved after revascularization last year.  ABIs today are down a bit to 0.71 on the right and 0.64 on the left but she still has strong waveforms which are biphasic on the right and monophasic on the left. Carotid duplex today reveals velocities in the 40 to 59% range bilaterally without significant progression from previous studies.  This has previously followed in the 60 to 79% range and the velocities are still borderline for this.  Current Outpatient Medications  Medication Sig Dispense Refill  . acetaminophen (TYLENOL) 500 MG tablet Take 500 mg by mouth every 6 (six) hours as needed.    . B-COMPLEX-C PO Take 1 tablet by mouth daily.    . benazepril (LOTENSIN) 5 MG tablet Take 1 tablet (5 mg total) by mouth daily. 90 tablet 1  . cefUROXime (CEFTIN) 250 MG tablet Take 1 tablet (250 mg total) by mouth 2 (two) times daily with a meal. 20 tablet 0  . cilostazol (PLETAL) 100 MG tablet Take 1 tablet (100 mg total) by mouth 2 (two) times daily. 180 tablet 4  . cyclobenzaprine (FLEXERIL) 5 MG tablet Take 5 mg by mouth 3 (three) times daily as needed for muscle spasms.     . diphenhydrAMINE (BENADRYL) 25 MG tablet Take 25 mg by mouth every 6 (six) hours as needed.     . DUREZOL 0.05 % EMUL Place into the right eye every morning.     . gabapentin (NEURONTIN) 300 MG capsule Take 1 capsule (300 mg total) by mouth 2 (two) times daily. 180 capsule 2  . hydrOXYzine (ATARAX/VISTARIL) 25 MG tablet Take 1 tablet (25 mg total) by mouth 3 (three)  times daily as needed. 270 tablet 3  . levothyroxine (SYNTHROID) 50 MCG tablet Take 1 tablet (50 mcg total) by mouth daily before breakfast. 30 tablet 1  . lidocaine (LIDODERM) 5 % Place 1 patch onto the skin daily. Remove & Discard patch within 12 hours or as directed by MD 60 patch 6  . Misc Natural Products (JOINT HEALTH PO) Take by mouth daily.    . montelukast (SINGULAIR) 10 MG tablet Take 1 tablet (10 mg total) by mouth at bedtime. 90 tablet 3  . Multiple Vitamins-Minerals (MULTIVITAMIN WITH MINERALS) tablet Take 1 tablet by mouth daily.    Marland Kitchen NIACINAMIDE PO Take 1 capsule by mouth daily.    Marland Kitchen omeprazole (PRILOSEC) 20 MG capsule Take 1 capsule (20 mg total) by mouth daily. 90 capsule 3  . tiotropium (SPIRIVA HANDIHALER) 18 MCG inhalation capsule PLACE 1 CAPSULE INTO INHALER AND INHALE THE CONTENTS DAILY 90 capsule 3  . UNABLE TO FIND Take 1 tablet by mouth 2 (two) times daily. Cardio FX    . UNABLE TO FIND Take 1 tablet by mouth daily. Cinnergy      No current facility-administered medications for this visit.    Past Medical History:  Diagnosis Date  . Abdominal distension 03/18/2012  . Abnormal weight loss 10/30/2017  . Acute kidney  injury ([employer]) 03/22/2012  . 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 Anxiety   . Blind right eye   . Cardiac arrest ([place])   . Cataract    right eye  . Claudication ([place])   . Complication of anesthesia 03/15/2012   "crashed" during first part of 3-part back surgery.   Marland Kitchen COPD (chronic obstructive pulmonary disease) ([place])   . Delirium 03/18/2012  . Dyspnea   . GERD (gastroesophageal reflux disease)   . Heart murmur   . Hypertension   . Leaky heart valve   . Oxygen deficit    uses O2 at night 2 liters  . Peripheral vascular disease ([place])   . Scoliosis   . Sepsis ([place])   . Shock ([place]) 03/23/2012  . SIRS (systemic inflammatory response syndrome) ([place]) 03/23/2012  . Spinal stenosis   . Stroke [facility])    Eye  . Tachycardia     Past Surgical History:  Procedure  Laterality Date  . ABDOMINAL HYSTERECTOMY     total  . BACK SURGERY    . BREAST LUMPECTOMY Left   . CATARACT EXTRACTION W/PHACO Left 08/14/2019   Procedure: CATARACT EXTRACTION PHACO AND INTRAOCULAR LENS PLACEMENT (IOC) LEFT 10.22 00:59.7 42.4%;  Surgeon: Leandrew Koyanagi, MD;  Location: [place];  Service: Ophthalmology;  Laterality: Left;  leave it last patient per [place]  . LOWER EXTREMITY ANGIOGRAPHY Right 06/20/2018   Procedure: LOWER EXTREMITY ANGIOGRAPHY;  Surgeon: Algernon Huxley, MD;  Location: [place] [facility];  Service: Cardiovascular;  Laterality: Right;  . LOWER EXTREMITY ANGIOGRAPHY Left 02/03/2020   Procedure: LOWER EXTREMITY ANGIOGRAPHY;  Surgeon: Algernon Huxley, MD;  Location: [place] [facility];  Service: Cardiovascular;  Laterality: Left;  . 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 WISDOM TOOTH EXTRACTION       Social History   Tobacco Use  . Smoking status: Current Every Day Smoker    Packs/day: 0.50    Years: 30.00    Pack years: 15.00    Types: Cigarettes  . Smokeless tobacco: Never Used  . Tobacco comment: since "mid 1s"  Vaping Use  . Vaping Use: Some days  Substance Use Topics  . Alcohol use: Yes    Comment: wine on occasion (1x/month)  . Drug use: No      Family History  Problem Relation Age of Onset  . Intracerebral hemorrhage Mother   . Hypertension Mother   . Heart failure Father   . Schizophrenia Daughter   . Rheum arthritis Maternal Grandmother   . Kidney disease Maternal Grandmother   . Heart disease Paternal Grandmother   . Stroke Paternal Grandmother   . Stroke Paternal Grandfather   . Heart disease Paternal Grandfather     Allergies  Allergen Reactions  . Tramadol Rash  . Other Itching    Cat Gut sutures   . Oxycodone Hives  . Amlodipine Hives  . Augmentin [Amoxicillin-Pot Clavulanate] Diarrhea  . Azithromycin Hives  . Contrast Media [Iodinated Diagnostic Agents] Hives  . Doxycycline  Diarrhea  . Erythromycin Diarrhea  . Hydrocodone Nausea Only  . Lactose Intolerance (Gi) Diarrhea    Bloating  . Minocycline Diarrhea  . Nsaids Other (See Comments)    Internal Bleeding  . Relafen [Nabumetone] Hives  . Sulfa Antibiotics Hives  . Tetracyclines & Related Diarrhea    REVIEW OF SYSTEMS (Negative unless checked)  Constitutional: '[]' ?Weight loss  '[]' ?Fever  '[]' ?Chills Cardiac: '[]' ?Chest pain   '[]' ?  Chest pressure   '[x]' ?Palpitations   '[]' ?Shortness of breath when laying flat   '[]' ?Shortness of breath at rest   '[]' ?Shortness of breath with exertion. Vascular:  '[x]' ?Pain in legs with walking   '[x]' ?Pain in legs at rest   '[]' ?Pain in legs when laying flat   '[]' ?Claudication   '[]' ?Pain in feet when walking  '[]' ?Pain in feet at rest  '[]' ?Pain in feet when laying flat   '[]' ?History of DVT   '[]' ?Phlebitis   '[]' ?Swelling in legs   '[]' ?Varicose veins   '[]' ?Non-healing ulcers Pulmonary:   '[]' ?Uses home oxygen   '[]' ?Productive cough   '[]' ?Hemoptysis   '[]' ?Wheeze  '[]' ?COPD   '[]' ?Asthma Neurologic:  '[]' ?Dizziness  '[]' ?Blackouts   '[]' ?Seizures   '[]' ?History of stroke   '[]' ?History of TIA  '[]' ?Aphasia   '[]' ?Temporary blindness   '[]' ?Dysphagia   '[]' ?Weakness or numbness in arms   '[]' ?Weakness or numbness in legs Musculoskeletal:  '[x]' ?Arthritis   '[]' ?Joint swelling   '[]' ?Joint pain   '[]' ?Low back pain Hematologic:  '[]' ?Easy bruising  '[]' ?Easy bleeding   '[]' ?Hypercoagulable state   '[]' ?Anemic   Gastrointestinal:  '[x]' ?Blood in stool   '[]' ?Vomiting blood  '[x]' ?Gastroesophageal reflux/heartburn   '[]' ?Abdominal pain Genitourinary:  '[]' ?Chronic kidney disease   '[]' ?Difficult urination  '[]' ?Frequent urination  '[]' ?Burning with urination   '[]' ?Hematuria Skin:  '[]' ?Rashes   '[]' ?Ulcers   '[]' ?Wounds Psychological:  '[]' ?History of anxiety   '[]' ? History of major depression.  Physical Examination  BP (!) 155/95   Pulse (!) 139   Ht '5\' 3"'  (1.6 m)   Wt 109 lb (49.4 kg)   BMI 19.31 kg/m  Gen:  WD/WN, NAD Head: [place]/AT, No temporalis wasting. Ear/Nose/Throat:  Hearing grossly intact, nares w/o erythema or drainage Eyes: Conjunctiva clear. Sclera non-icteric Neck: Supple.  Trachea midline Pulmonary:  Good air movement, no use of accessory muscles.  Cardiac: Tachycardic Vascular:  Vessel Right Left  Radial Palpable Palpable                          PT Palpable Palpable  DP Palpable Palpable   Gastrointestinal: soft, non-tender/non-distended. No guarding/reflex.  Musculoskeletal: M/S 5/5 throughout.  No deformity or atrophy.  No edema. Neurologic: Sensation grossly intact in extremities.  Symmetrical.  Speech is fluent.  Psychiatric: Judgment intact, Mood & affect appropriate for pt's clinical situation. Dermatologic: No rashes or ulcers noted.  No cellulitis or open wounds.       Labs Recent Results (from the past 2160 hour(s))  TSH     Status: Abnormal   Collection Time: 06/30/20 10:37 AM  Result Value Ref Range   TSH 5.160 (H) 0.450 - 4.500 uIU/mL  Comprehensive metabolic panel     Status: Abnormal   Collection Time: 06/30/20 10:37 AM  Result Value Ref Range   Glucose 101 (H) 65 - 99 mg/dL   BUN 7 (L) 8 - 27 mg/dL   Creatinine, Ser 0.74 0.57 - 1.00 mg/dL   GFR calc non Af Amer 77 >59 mL/min/1.73   GFR calc Af Amer 88 >59 mL/min/1.73    Comment: **In accordance with recommendations from the NKF-ASN Task force,**   [facility] is in the process of updating its eGFR calculation to the   2021 CKD-EPI creatinine equation that estimates kidney function   without a race variable.    BUN/Creatinine Ratio 9 (L) 12 - 28   Sodium 140 134 - 144 mmol/L   Potassium 3.5 3.5 - 5.2 mmol/L   Chloride 100 96 -  106 mmol/L   CO2 26 20 - 29 mmol/L   Calcium 9.3 8.7 - 10.3 mg/dL   Total Protein 6.7 6.0 - 8.5 g/dL   Albumin 4.4 3.7 - 4.7 g/dL   Globulin, Total 2.3 1.5 - 4.5 g/dL   Albumin/Globulin Ratio 1.9 1.2 - 2.2   Bilirubin Total 0.5 0.0 - 1.2 mg/dL   Alkaline Phosphatase 88 44 - 121 IU/L    Comment:               **Please note  reference interval change**   AST 15 0 - 40 IU/L   ALT 7 0 - 32 IU/L  CBC with Differential/Platelet     Status: Abnormal   Collection Time: 06/30/20 10:37 AM  Result Value Ref Range   WBC 11.3 (H) 3.4 - 10.8 x10E3/uL   RBC 4.36 3.77 - 5.28 x10E6/uL   Hemoglobin 13.2 11.1 - 15.9 g/dL   Hematocrit 39.7 34.0 - 46.6 %   MCV 91 79 - 97 fL   MCH 30.3 26.6 - 33.0 pg   MCHC 33.2 31.5 - 35.7 g/dL   RDW 13.2 11.7 - 15.4 %   Platelets 483 (H) 150 - 450 x10E3/uL   Neutrophils 67 Not Estab. %   Lymphs 23 Not Estab. %   Monocytes 6 Not Estab. %   Eos 3 Not Estab. %   Basos 1 Not Estab. %   Neutrophils Absolute 7.7 (H) 1.4 - 7.0 x10E3/uL   Lymphocytes Absolute 2.6 0.7 - 3.1 x10E3/uL   Monocytes Absolute 0.6 0.1 - 0.9 x10E3/uL   EOS (ABSOLUTE) 0.3 0.0 - 0.4 x10E3/uL   Basophils Absolute 0.1 0.0 - 0.2 x10E3/uL   Immature Granulocytes 0 Not Estab. %   Immature Grans (Abs) 0.0 0.0 - 0.1 x10E3/uL    Radiology No results found.  Assessment/Plan HTN (hypertension) blood pressure control important in reducing the progression of atherosclerotic disease. On appropriate oral medications.  Atherosclerosis of native artery of both lower extremities with intermittent claudication (HCC) ABIs today are down a bit to 0.71 on the right and 0.64 on the left but she still has strong waveforms which are biphasic on the right and monophasic on the left.  Symptoms markedly improved after revascularization last year.  Continue current medical regimen.  Recheck in 6 months.  Carotid stenosis Carotid duplex today reveals velocities in the 40 to 59% range bilaterally without significant progression from previous studies.  This has previously followed in the 60 to 79% range and the velocities are still borderline for this.  We will continue to follow this on 44-monthintervals.    JLeotis Pain MD  08/25/2020 4:23 PM    This note was created with Dragon medical transcription system.  Any errors from dictation  are purely unintentional

## 2020-08-28 DIAGNOSIS — E063 Autoimmune thyroiditis: Secondary | ICD-10-CM | POA: Diagnosis not present

## 2020-09-03 DIAGNOSIS — J301 Allergic rhinitis due to pollen: Secondary | ICD-10-CM | POA: Diagnosis not present

## 2020-09-03 DIAGNOSIS — J019 Acute sinusitis, unspecified: Secondary | ICD-10-CM | POA: Diagnosis not present

## 2020-09-10 ENCOUNTER — Ambulatory Visit (INDEPENDENT_AMBULATORY_CARE_PROVIDER_SITE_OTHER): Payer: Medicare Other | Admitting: Family Medicine

## 2020-09-10 ENCOUNTER — Other Ambulatory Visit: Payer: Self-pay

## 2020-09-10 ENCOUNTER — Encounter: Payer: Self-pay | Admitting: Family Medicine

## 2020-09-10 VITALS — BP 166/105 | HR 130 | Temp 98.1°F | Ht 62.99 in | Wt 109.8 lb

## 2020-09-10 DIAGNOSIS — Z114 Encounter for screening for human immunodeficiency virus [HIV]: Secondary | ICD-10-CM | POA: Diagnosis not present

## 2020-09-10 DIAGNOSIS — Z202 Contact with and (suspected) exposure to infections with a predominantly sexual mode of transmission: Secondary | ICD-10-CM

## 2020-09-10 DIAGNOSIS — E039 Hypothyroidism, unspecified: Secondary | ICD-10-CM

## 2020-09-10 DIAGNOSIS — I10 Essential (primary) hypertension: Secondary | ICD-10-CM | POA: Diagnosis not present

## 2020-09-10 DIAGNOSIS — J301 Allergic rhinitis due to pollen: Secondary | ICD-10-CM | POA: Diagnosis not present

## 2020-09-10 DIAGNOSIS — Z113 Encounter for screening for infections with a predominantly sexual mode of transmission: Secondary | ICD-10-CM | POA: Diagnosis not present

## 2020-09-10 DIAGNOSIS — R000 Tachycardia, unspecified: Secondary | ICD-10-CM | POA: Diagnosis not present

## 2020-09-10 DIAGNOSIS — I6523 Occlusion and stenosis of bilateral carotid arteries: Secondary | ICD-10-CM

## 2020-09-10 DIAGNOSIS — Z0189 Encounter for other specified special examinations: Secondary | ICD-10-CM

## 2020-09-10 MED ORDER — ATENOLOL 25 MG PO TABS: 25.0000 mg | ORAL_TABLET | Freq: Every day | ORAL | 3 refills | Status: DC

## 2020-09-10 MED ORDER — BENAZEPRIL HCL 5 MG PO TABS: 5.0000 mg | ORAL_TABLET | Freq: Every day | ORAL | 1 refills | Status: DC

## 2020-09-10 NOTE — Progress Notes (Signed)
BP (!) 166/105   Pulse (!) 130   Temp 98.1 F (36.7 C) (Oral)   Ht 5' 2.99" (1.6 m)   Wt 109 lb 12.8 oz (49.8 kg)   BMI 19.46 kg/m    Subjective:    Patient ID: Savannah Mcclure, female    DOB: [birth year], 80 y.o.   MRN: [id number]  HPI: Savannah Mcclure is a 80 y.o. female  Chief Complaint  Patient presents with  . Hypertension  . Lab work    Gonorrhea testing, daughter has in her throat and she drank from her cup,   HYPERTENSION Hypertension status: uncontrolled  Satisfied with current treatment? no Duration of hypertension: chronic BP monitoring frequency:  not checking BP medication side effects:  no Medication compliance: excellent compliance Recurrent headaches: no Visual changes: no Palpitations: no Dyspnea: no Chest pain: no Lower extremity edema: no Dizzy/lightheaded: no   PALPITATIONS Duration: chronic Symptom description: heart racing Duration of episode: minutes Frequency: recurrentl Activity when event occurred: standing Related to exertion: yes Dyspnea: yes Chest pain: no Syncope: no Anxiety/stress: yes Nausea/vomiting: no Diaphoresis: no Coronary artery disease: no Congestive heart failure: no Arrhythmia:no Thyroid disease: no Caffeine intake: 1 cafienated beverage Status:  stable Treatments attempted:none  Did not come in for her STI screen. Will get them drawn today.   HYPOTHYROIDISM- has not been taking her synthroid Thyroid control status:uncontrolled Satisfied with current treatment? no Medication side effects: yes Medication compliance: stopped her medicine Etiology of hypothyroidism:  Recent dose adjustment:no Fatigue: yes Cold intolerance: no Heat intolerance: no Weight gain: no Weight loss: no Constipation: no Diarrhea/loose stools: no Palpitations: yes Lower extremity edema: no Anxiety/depressed mood: yes   Relevant past medical, surgical, family and social history reviewed and updated as  indicated. Interim medical history since our last visit reviewed. Allergies and medications reviewed and updated.  Review of Systems  Constitutional: Negative.   Respiratory: Negative.   Cardiovascular: Positive for palpitations. Negative for chest pain and leg swelling.  Gastrointestinal: Negative.   Genitourinary: Negative.   Musculoskeletal: Negative.   Neurological: Negative.   Psychiatric/Behavioral: Negative.     Per HPI unless specifically indicated above     Objective:    BP (!) 166/105   Pulse (!) 130   Temp 98.1 F (36.7 C) (Oral)   Ht 5' 2.99" (1.6 m)   Wt 109 lb 12.8 oz (49.8 kg)   BMI 19.46 kg/m   Wt Readings from Last 3 Encounters:  09/10/20 109 lb 12.8 oz (49.8 kg)  08/25/20 109 lb (49.4 kg)  04/28/20 118 lb (53.5 kg)    Physical Exam Vitals and nursing note reviewed.  Constitutional:      General: She is not in acute distress.    Appearance: Normal appearance. She is not ill-appearing, toxic-appearing or diaphoretic.  HENT:     Head: Normocephalic and atraumatic.     Right Ear: External ear normal.     Left Ear: External ear normal.     Nose: Nose normal.     Mouth/Throat:     Mouth: Mucous membranes are moist.     Pharynx: Oropharynx is clear.  Eyes:     General: No scleral icterus.       Right eye: No discharge.        Left eye: No discharge.     Extraocular Movements: Extraocular movements intact.     Conjunctiva/sclera: Conjunctivae normal.     Pupils: Pupils are equal, round, and reactive to light.  Cardiovascular:  Rate and Rhythm: Normal rate and regular rhythm.     Pulses: Normal pulses.     Heart sounds: Normal heart sounds. No murmur heard. No friction rub. No gallop.   Pulmonary:     Effort: Pulmonary effort is normal. No respiratory distress.     Breath sounds: Normal breath sounds. No stridor. No wheezing, rhonchi or rales.  Chest:     Chest wall: No tenderness.  Musculoskeletal:        General: Normal range of motion.      Cervical back: Normal range of motion and neck supple.  Skin:    General: Skin is warm and dry.     Capillary Refill: Capillary refill takes less than 2 seconds.     Coloration: Skin is not jaundiced or pale.     Findings: No bruising, erythema, lesion or rash.  Neurological:     General: No focal deficit present.     Mental Status: She is alert and oriented to person, place, and time. Mental status is at baseline.  Psychiatric:        Mood and Affect: Mood normal.        Behavior: Behavior normal.        Thought Content: Thought content normal.        Judgment: Judgment normal.     Results for orders placed or performed in visit on 09/10/20  GC NAA, Pharyngeal   TH  Result Value Ref Range   N GONORRHOEA RRNA NPH QL PCR Negative Negative  HSV(herpes simplex vrs) 1+2 ab-IgG  Result Value Ref Range   HSV 1 Glycoprotein G Ab, IgG 9.47 (H) 0.00 - 0.90 index   HSV 2 IgG, Type Spec <0.91 0.00 - 0.90 index  RPR  Result Value Ref Range   RPR Ser Ql Non Reactive Non Reactive  Hepatitis panel, acute  Result Value Ref Range   Hep A IgM Negative Negative   Hepatitis B Surface Ag Negative Negative   Hep B C IgM Negative Negative   Hep C Virus Ab <0.1 0.0 - 0.9 s/co ratio  HIV Antibody (routine testing w rflx)  Result Value Ref Range   HIV Screen 4th Generation wRfx Non Reactive Non Reactive  Allergen Panel (27) + IGE  Result Value Ref Range   Class Description Allergens WILL FOLLOW    IgE (Immunoglobulin E), Serum WILL FOLLOW    D Pteronyssinus IgE WILL FOLLOW    D Farinae IgE WILL FOLLOW    Cat Dander IgE WILL FOLLOW    Dog Dander IgE WILL FOLLOW    [place] Grass IgE WILL FOLLOW    Timothy Grass IgE WILL FOLLOW    [name] Bluegrass IgE WILL FOLLOW    Dayanna Pryce Grass IgE WILL FOLLOW    Bahia Grass IgE WILL FOLLOW    Cockroach, American IgE WILL FOLLOW    Penicillium Chrysogen IgE WILL FOLLOW    Cladosporium Herbarum IgE WILL FOLLOW    Aspergillus Fumigatus IgE WILL FOLLOW     Mucor Racemosus IgE WILL FOLLOW    Alternaria Alternata IgE WILL FOLLOW    Setomelanomma Rostrat WILL FOLLOW    Oak, White IgE WILL FOLLOW    Elm, American IgE WILL FOLLOW    Maple/Box Elder IgE WILL FOLLOW    Common Silver Wendee Copp IgE WILL FOLLOW    Hickory, White IgE WILL FOLLOW    White Mulberry IgE WILL FOLLOW    [place], [place] IgE WILL FOLLOW    Ragweed, Short IgE WILL  FOLLOW    Plantain, English IgE WILL FOLLOW    Cocklebur IgE WILL FOLLOW    Pigweed, Rough IgE WILL FOLLOW   CBC with Differential/Platelet  Result Value Ref Range   WBC 12.7 (H) 3.4 - 10.8 x10E3/uL   RBC 4.42 3.77 - 5.28 x10E6/uL   Hemoglobin 13.4 11.1 - 15.9 g/dL   Hematocrit 40.4 34.0 - 46.6 %   MCV 91 79 - 97 fL   MCH 30.3 26.6 - 33.0 pg   MCHC 33.2 31.5 - 35.7 g/dL   RDW 12.5 11.7 - 15.4 %   Platelets 500 (H) 150 - 450 x10E3/uL   Neutrophils 68 Not Estab. %   Lymphs 23 Not Estab. %   Monocytes 6 Not Estab. %   Eos 2 Not Estab. %   Basos 1 Not Estab. %   Neutrophils Absolute 8.7 (H) 1.4 - 7.0 x10E3/uL   Lymphocytes Absolute 2.9 0.7 - 3.1 x10E3/uL   Monocytes Absolute 0.8 0.1 - 0.9 x10E3/uL   EOS (ABSOLUTE) 0.2 0.0 - 0.4 x10E3/uL   Basophils Absolute 0.1 0.0 - 0.2 x10E3/uL   Immature Granulocytes 0 Not Estab. %   Immature Grans (Abs) 0.0 0.0 - 0.1 [id number]  Basic metabolic panel  Result Value Ref Range   Glucose 97 65 - 99 mg/dL   BUN 8 8 - 27 mg/dL   Creatinine, Ser 0.73 0.57 - 1.00 mg/dL   GFR calc non Af Amer 78 >59 mL/min/1.73   GFR calc Af Amer 90 >59 mL/min/1.73   BUN/Creatinine Ratio 11 (L) 12 - 28   Sodium 140 134 - 144 mmol/L   Potassium 3.5 3.5 - 5.2 mmol/L   Chloride 98 96 - 106 mmol/L   CO2 25 20 - 29 mmol/L   Calcium 9.2 8.7 - 10.3 mg/dL  TSH  Result Value Ref Range   TSH 8.540 (H) 0.450 - 4.500 uIU/mL      Assessment & Plan:   Problem List Items Addressed This Visit      Cardiovascular and Mediastinum   HTN (hypertension)    Will continue benazepril and add atenolol.  Recheck 2 weeks. Call with any concerns. Labs drawn today. Await results.      Relevant Medications   atenolol (TENORMIN) 25 MG tablet   benazepril (LOTENSIN) 5 MG tablet     Endocrine   Acquired hypothyroidism    Has been off her meds. Will recheck her labs and treat as needed. Await results.       Relevant Medications   atenolol (TENORMIN) 25 MG tablet     Other   Sinus tachycardia    Will get her started on atenolol. Recheck 2 weeks. Call with any concerns.        Other Visit Diagnoses    Seasonal allergic rhinitis due to pollen    -  Primary   Wants labs for her ENT- drawn today. (Dr. Richardson Landry)   Relevant Orders   Allergen Panel (27) + IGE (Completed)   Tachycardia       Will get her started on atenolol. Recheck 2 weeks. Call with any concerns.    Relevant Orders   EKG 12-Lead (Completed)   CBC with Differential/Platelet (Completed)   Basic metabolic panel (Completed)   TSH (Completed)   Exposure to gonorrhea       Daughter had oral gonorrhea and she shared a drink with her. Will check STIs as well as oral gonorrhea swab- orders in. Await results. Likely low risk.   Relevant  Orders   GC NAA, Pharyngeal   Routine screening for STI (sexually transmitted infection)       Labs drawn today. Await results.    Relevant Orders   GC NAA, Pharyngeal   Encounter for other specified special examinations       Labs drawn today. Await results.    Relevant Orders   GC NAA, Pharyngeal   Screening for HIV (human immunodeficiency virus)       Labs drawn today. Await results.        Follow up plan: Return in about 2 weeks (around 09/24/2020).

## 2020-09-12 LAB — GC NAA, PHARYNGEAL: N GONORRHOEA RRNA NPH QL PCR: NEGATIVE

## 2020-09-13 DIAGNOSIS — E039 Hypothyroidism, unspecified: Secondary | ICD-10-CM | POA: Insufficient documentation

## 2020-09-13 NOTE — Assessment & Plan Note (Signed)
Will get her started on atenolol. Recheck 2 weeks. Call with any concerns.

## 2020-09-13 NOTE — Assessment & Plan Note (Signed)
Has been off her meds. Will recheck her labs and treat as needed. Await results.

## 2020-09-13 NOTE — Assessment & Plan Note (Signed)
Will continue benazepril and add atenolol. Recheck 2 weeks. Call with any concerns. Labs drawn today. Await results.

## 2020-09-14 ENCOUNTER — Other Ambulatory Visit: Payer: Self-pay | Admitting: Family Medicine

## 2020-09-14 DIAGNOSIS — E039 Hypothyroidism, unspecified: Secondary | ICD-10-CM

## 2020-09-14 LAB — HSV(HERPES SIMPLEX VRS) I + II AB-IGG
HSV 1 Glycoprotein G Ab, IgG: 9.47 index — ABNORMAL HIGH (ref 0.00–0.90)
HSV 2 IgG, Type Spec: <0.91 index (ref 0.00–0.90)

## 2020-09-14 LAB — CBC WITH DIFFERENTIAL/PLATELET
Basophils Absolute: 0.1 10*3/uL (ref 0.0–0.2)
Basos: 1 %
EOS (ABSOLUTE): 0.2 10*3/uL (ref 0.0–0.4)
Eos: 2 %
Hematocrit: 40.4 % (ref 34.0–46.6)
Hemoglobin: 13.4 g/dL (ref 11.1–15.9)
Immature Grans (Abs): 0 10*3/uL (ref 0.0–0.1)
Immature Granulocytes: 0 %
Lymphocytes Absolute: 2.9 10*3/uL (ref 0.7–3.1)
Lymphs: 23 %
MCH: 30.3 pg (ref 26.6–33.0)
MCHC: 33.2 g/dL (ref 31.5–35.7)
MCV: 91 fL (ref 79–97)
Monocytes Absolute: 0.8 10*3/uL (ref 0.1–0.9)
Monocytes: 6 %
Neutrophils Absolute: 8.7 10*3/uL — ABNORMAL HIGH (ref 1.4–7.0)
Neutrophils: 68 %
Platelets: 500 10*3/uL — ABNORMAL HIGH (ref 150–450)
RBC: 4.42 x10E6/uL (ref 3.77–5.28)
RDW: 12.5 % (ref 11.7–15.4)
WBC: 12.7 10*3/uL — ABNORMAL HIGH (ref 3.4–10.8)

## 2020-09-14 LAB — BASIC METABOLIC PANEL
BUN/Creatinine Ratio: 11 — ABNORMAL LOW (ref 12–28)
BUN: 8 mg/dL (ref 8–27)
CO2: 25 mmol/L (ref 20–29)
Calcium: 9.2 mg/dL (ref 8.7–10.3)
Chloride: 98 mmol/L (ref 96–106)
Creatinine, Ser: 0.73 mg/dL (ref 0.57–1.00)
GFR calc Af Amer: 90 mL/min/{1.73_m2} (ref >59)
GFR calc non Af Amer: 78 mL/min/{1.73_m2} (ref >59)
Glucose: 97 mg/dL (ref 65–99)
Potassium: 3.5 mmol/L (ref 3.5–5.2)
Sodium: 140 mmol/L (ref 134–144)

## 2020-09-14 LAB — ALLERGEN PANEL (27) + IGE
Alternaria Alternata IgE: <0.1 kU/L
Aspergillus Fumigatus IgE: <0.1 kU/L
Bahia Grass IgE: <0.1 kU/L
Bermuda Grass IgE: <0.1 kU/L
Cat Dander IgE: <0.1 kU/L
Cedar, Mountain IgE: <0.1 kU/L
Cladosporium Herbarum IgE: <0.1 kU/L
Cocklebur IgE: <0.1 kU/L
Cockroach, American IgE: <0.1 kU/L
Common Silver Birch IgE: <0.1 kU/L
D Farinae IgE: <0.1 kU/L
D Pteronyssinus IgE: <0.1 kU/L
Dog Dander IgE: <0.1 kU/L
Elm, American IgE: <0.1 kU/L
Hickory, White IgE: <0.1 kU/L
IgE (Immunoglobulin E), Serum: 14 IU/mL (ref 6–495)
Johnson Grass IgE: <0.1 kU/L
Kentucky Bluegrass IgE: <0.1 kU/L
Maple/Box Elder IgE: <0.1 kU/L
Mucor Racemosus IgE: <0.1 kU/L
Oak, White IgE: <0.1 kU/L
Penicillium Chrysogen IgE: <0.1 kU/L
Pigweed, Rough IgE: <0.1 kU/L
Plantain, English IgE: <0.1 kU/L
Ragweed, Short IgE: <0.1 kU/L
Setomelanomma Rostrat: <0.1 kU/L
Timothy Grass IgE: <0.1 kU/L
White Mulberry IgE: <0.1 kU/L

## 2020-09-14 LAB — RPR: RPR Ser Ql: NONREACTIVE

## 2020-09-14 LAB — HEPATITIS PANEL, ACUTE
Hep A IgM: NEGATIVE
Hep B C IgM: NEGATIVE
Hep C Virus Ab: <0.1 s/co ratio (ref 0.0–0.9)
Hepatitis B Surface Ag: NEGATIVE

## 2020-09-14 LAB — TSH: TSH: 8.54 u[IU]/mL — ABNORMAL HIGH (ref 0.450–4.500)

## 2020-09-14 LAB — HIV ANTIBODY (ROUTINE TESTING W REFLEX): HIV Screen 4th Generation wRfx: NONREACTIVE

## 2020-09-14 MED ORDER — THYROID 30 MG PO TABS: 30.0000 mg | ORAL_TABLET | Freq: Every day | ORAL | 1 refills | Status: DC

## 2020-09-14 NOTE — Progress Notes (Signed)
Interpreted by me on 09/10/20. Sinus tachycardia at 123, incomplete RBBB

## 2020-09-15 ENCOUNTER — Telehealth: Payer: Self-pay

## 2020-09-15 NOTE — Progress Notes (Signed)
Apt has been schduled for 6 week labs

## 2020-09-15 NOTE — Telephone Encounter (Signed)
erroneous error  

## 2020-09-17 LAB — GC NAA, PHARYNGEAL

## 2020-09-24 ENCOUNTER — Ambulatory Visit (INDEPENDENT_AMBULATORY_CARE_PROVIDER_SITE_OTHER): Payer: Medicare Other | Admitting: Family Medicine

## 2020-09-24 ENCOUNTER — Encounter: Payer: Self-pay | Admitting: Family Medicine

## 2020-09-24 ENCOUNTER — Other Ambulatory Visit: Payer: Self-pay

## 2020-09-24 VITALS — BP 129/74 | HR 101 | Temp 97.7°F | Wt 112.2 lb

## 2020-09-24 DIAGNOSIS — E039 Hypothyroidism, unspecified: Secondary | ICD-10-CM

## 2020-09-24 DIAGNOSIS — R000 Tachycardia, unspecified: Secondary | ICD-10-CM | POA: Diagnosis not present

## 2020-09-24 DIAGNOSIS — I6523 Occlusion and stenosis of bilateral carotid arteries: Secondary | ICD-10-CM | POA: Diagnosis not present

## 2020-09-24 NOTE — Progress Notes (Signed)
BP 129/74   Pulse (!) 101   Temp 97.7 F (36.5 C)   Wt 112 lb 3.2 oz (50.9 kg)   SpO2 98%   BMI 19.88 kg/m    Subjective:    Patient ID: Savannah Mcclure, female    DOB: [birth year], 81 y.o.   MRN: [id number]  HPI: Savannah Mcclure is a 80 y.o. female  Chief Complaint  Patient presents with  . Hypertension  . Tachycardia    Follow up    Started her atenolol 3 days ago. No side effects that she has noticed any side effects. HR has come down. She is feeling better. No other concerns.   HYPOTHYROIDISM Thyroid control status:uncontrolled Satisfied with current treatment? yes Medication side effects: no Medication compliance: excellent compliance Recent dose adjustment:yes Fatigue: no Cold intolerance: no Heat intolerance: no Weight gain: no Weight loss: no Constipation: no Diarrhea/loose stools: no Palpitations: yes Lower extremity edema: no Anxiety/depressed mood: yes  Relevant past medical, surgical, family and social history reviewed and updated as indicated. Interim medical history since our last visit reviewed. Allergies and medications reviewed and updated.  Review of Systems  Constitutional: Negative.   Respiratory: Negative.   Cardiovascular: Negative.   Gastrointestinal: Negative.   Musculoskeletal: Negative.   Neurological: Negative.   Psychiatric/Behavioral: Negative.     Per HPI unless specifically indicated above     Objective:    BP 129/74   Pulse (!) 101   Temp 97.7 F (36.5 C)   Wt 112 lb 3.2 oz (50.9 kg)   SpO2 98%   BMI 19.88 kg/m   Wt Readings from Last 3 Encounters:  09/24/20 112 lb 3.2 oz (50.9 kg)  09/10/20 109 lb 12.8 oz (49.8 kg)  08/25/20 109 lb (49.4 kg)    Physical Exam Vitals and nursing note reviewed.  Constitutional:      General: She is not in acute distress.    Appearance: Normal appearance. She is not ill-appearing, toxic-appearing or diaphoretic.  HENT:     Head: Normocephalic and atraumatic.      Right Ear: External ear normal.     Left Ear: External ear normal.     Nose: Nose normal.     Mouth/Throat:     Mouth: Mucous membranes are moist.     Pharynx: Oropharynx is clear.  Eyes:     General: No scleral icterus.       Right eye: No discharge.        Left eye: No discharge.     Extraocular Movements: Extraocular movements intact.     Conjunctiva/sclera: Conjunctivae normal.     Pupils: Pupils are equal, round, and reactive to light.  Cardiovascular:     Rate and Rhythm: Normal rate and regular rhythm.     Pulses: Normal pulses.     Heart sounds: Normal heart sounds. No murmur heard. No friction rub. No gallop.   Pulmonary:     Effort: Pulmonary effort is normal. No respiratory distress.     Breath sounds: Normal breath sounds. No stridor. No wheezing, rhonchi or rales.  Chest:     Chest wall: No tenderness.  Musculoskeletal:        General: Normal range of motion.     Cervical back: Normal range of motion and neck supple.  Skin:    General: Skin is warm and dry.     Capillary Refill: Capillary refill takes less than 2 seconds.     Coloration: Skin is not jaundiced or pale.  Findings: No bruising, erythema, lesion or rash.  Neurological:     General: No focal deficit present.     Mental Status: She is alert and oriented to person, place, and time. Mental status is at baseline.  Psychiatric:        Mood and Affect: Mood normal.        Behavior: Behavior normal.        Thought Content: Thought content normal.        Judgment: Judgment normal.     Results for orders placed or performed in visit on 09/10/20  GC NAA, Pharyngeal   TH  Result Value Ref Range   N GONORRHOEA RRNA NPH QL PCR Negative Negative  GC NAA, Pharyngeal   TH  Result Value Ref Range   N GONORRHOEA RRNA NPH QL PCR CANCELED   HSV(herpes simplex vrs) 1+2 ab-IgG  Result Value Ref Range   HSV 1 Glycoprotein G Ab, IgG 9.47 (H) 0.00 - 0.90 index   HSV 2 IgG, Type Spec <0.91 0.00 - 0.90  index  RPR  Result Value Ref Range   RPR Ser Ql Non Reactive Non Reactive  Hepatitis panel, acute  Result Value Ref Range   Hep A IgM Negative Negative   Hepatitis B Surface Ag Negative Negative   Hep B C IgM Negative Negative   Hep C Virus Ab <0.1 0.0 - 0.9 s/co ratio  HIV Antibody (routine testing w rflx)  Result Value Ref Range   HIV Screen 4th Generation wRfx Non Reactive Non Reactive  Allergen Panel (27) + IGE  Result Value Ref Range   Class Description Allergens Comment    IgE (Immunoglobulin E), Serum 14 6 - 495 IU/mL   D Pteronyssinus IgE <0.10 Class 0 kU/L   D Farinae IgE <0.10 Class 0 kU/L   Cat Dander IgE <0.10 Class 0 kU/L   Dog Dander IgE <0.10 Class 0 kU/L   [place] Grass IgE <0.10 Class 0 kU/L   Timothy Grass IgE <0.10 Class 0 kU/L   [name] Bluegrass IgE <0.10 Class 0 kU/L   Vinay Ertl Grass IgE <0.10 Class 0 kU/L   Bahia Grass IgE <0.10 Class 0 kU/L   Cockroach, American IgE <0.10 Class 0 kU/L   Penicillium Chrysogen IgE <0.10 Class 0 kU/L   Cladosporium Herbarum IgE <0.10 Class 0 kU/L   Aspergillus Fumigatus IgE <0.10 Class 0 kU/L   Mucor Racemosus IgE <0.10 Class 0 kU/L   Alternaria Alternata IgE <0.10 Class 0 kU/L   Setomelanomma Rostrat <0.10 Class 0 kU/L   Oak, White IgE <0.10 Class 0 kU/L   Elm, American IgE <0.10 Class 0 kU/L   Maple/Box Elder IgE <0.10 Class 0 kU/L   Common Silver Wendee Copp IgE <0.10 Class 0 kU/L   Hickory, White IgE <0.10 Class 0 kU/L   White Mulberry IgE <0.10 Class 0 kU/L   Cedar, [place] IgE <0.10 Class 0 kU/L   Ragweed, Short IgE <0.10 Class 0 kU/L   Plantain, English IgE <0.10 Class 0 kU/L   Cocklebur IgE <0.10 Class 0 kU/L   Pigweed, Rough IgE <0.10 Class 0 kU/L  CBC with Differential/Platelet  Result Value Ref Range   WBC 12.7 (H) 3.4 - 10.8 x10E3/uL   RBC 4.42 3.77 - 5.28 x10E6/uL   Hemoglobin 13.4 11.1 - 15.9 g/dL   Hematocrit 40.4 34.0 - 46.6 %   MCV 91 79 - 97 fL   MCH 30.3 26.6 - 33.0 pg   MCHC 33.2 31.5 - 35.7  g/dL    RDW 12.5 11.7 - 15.4 %   Platelets 500 (H) 150 - 450 x10E3/uL   Neutrophils 68 Not Estab. %   Lymphs 23 Not Estab. %   Monocytes 6 Not Estab. %   Eos 2 Not Estab. %   Basos 1 Not Estab. %   Neutrophils Absolute 8.7 (H) 1.4 - 7.0 x10E3/uL   Lymphocytes Absolute 2.9 0.7 - 3.1 x10E3/uL   Monocytes Absolute 0.8 0.1 - 0.9 x10E3/uL   EOS (ABSOLUTE) 0.2 0.0 - 0.4 x10E3/uL   Basophils Absolute 0.1 0.0 - 0.2 x10E3/uL   Immature Granulocytes 0 Not Estab. %   Immature Grans (Abs) 0.0 0.0 - 0.1 [id number]/[id number]  Basic metabolic panel  Result Value Ref Range   Glucose 97 65 - 99 mg/dL   BUN 8 8 - 27 mg/dL   Creatinine, Ser 0.73 0.57 - 1.00 mg/dL   GFR calc non Af Amer 78 >59 mL/min/1.73   GFR calc Af Amer 90 >59 mL/min/1.73   BUN/Creatinine Ratio 11 (L) 12 - 28   Sodium 140 134 - 144 mmol/L   Potassium 3.5 3.5 - 5.2 mmol/L   Chloride 98 96 - 106 mmol/L   CO2 25 20 - 29 mmol/L   Calcium 9.2 8.7 - 10.3 mg/dL  TSH  Result Value Ref Range   TSH 8.540 (H) 0.450 - 4.500 uIU/mL      Assessment & Plan:   Problem List Items Addressed This Visit      Endocrine   Acquired hypothyroidism - Primary    Tolerating her armour thyroid well. Will recheck in 1 month. Call with any concerns. Continue to monitor.        Other Visit Diagnoses    Tachycardia       Doing much better and tolerating atenolol. Will recheck 1 month. Call with any concerns.        Follow up plan: Return in about 4 weeks (around 10/22/2020).

## 2020-09-24 NOTE — Assessment & Plan Note (Signed)
Tolerating her armour thyroid well. Will recheck in 1 month. Call with any concerns. Continue to monitor.

## 2020-09-28 ENCOUNTER — Telehealth: Payer: Self-pay | Admitting: Student in an Organized Health Care Education/Training Program

## 2020-09-28 DIAGNOSIS — M47814 Spondylosis without myelopathy or radiculopathy, thoracic region: Secondary | ICD-10-CM

## 2020-09-28 DIAGNOSIS — M4120 Other idiopathic scoliosis, site unspecified: Secondary | ICD-10-CM

## 2020-09-28 DIAGNOSIS — G894 Chronic pain syndrome: Secondary | ICD-10-CM

## 2020-09-28 NOTE — Telephone Encounter (Signed)
Patient is out of gabapentin, physician did not order return visit for med mgmt. Please advise

## 2020-09-29 DIAGNOSIS — Z872 Personal history of diseases of the skin and subcutaneous tissue: Secondary | ICD-10-CM | POA: Diagnosis not present

## 2020-09-29 DIAGNOSIS — D485 Neoplasm of uncertain behavior of skin: Secondary | ICD-10-CM | POA: Diagnosis not present

## 2020-09-29 DIAGNOSIS — Z86018 Personal history of other benign neoplasm: Secondary | ICD-10-CM | POA: Diagnosis not present

## 2020-09-29 DIAGNOSIS — L821 Other seborrheic keratosis: Secondary | ICD-10-CM | POA: Diagnosis not present

## 2020-09-29 DIAGNOSIS — L570 Actinic keratosis: Secondary | ICD-10-CM | POA: Diagnosis not present

## 2020-09-29 DIAGNOSIS — L578 Other skin changes due to chronic exposure to nonionizing radiation: Secondary | ICD-10-CM | POA: Diagnosis not present

## 2020-09-29 DIAGNOSIS — D225 Melanocytic nevi of trunk: Secondary | ICD-10-CM | POA: Diagnosis not present

## 2020-09-29 MED ORDER — GABAPENTIN 300 MG PO CAPS
300.0000 mg | ORAL_CAPSULE | Freq: Two times a day (BID) | ORAL | 2 refills | Status: DC
Start: 2020-09-29 — End: 2021-03-29

## 2020-09-29 NOTE — Telephone Encounter (Signed)
Patient notified

## 2020-10-01 DIAGNOSIS — J301 Allergic rhinitis due to pollen: Secondary | ICD-10-CM | POA: Diagnosis not present

## 2020-10-22 ENCOUNTER — Ambulatory Visit
Admission: RE | Admit: 2020-10-22 | Discharge: 2020-10-22 | Disposition: A | Discharge status: Home / Self Care | Payer: Medicare Other | Source: Home / Self Care | Attending: Family Medicine | Admitting: Family Medicine

## 2020-10-22 ENCOUNTER — Ambulatory Visit
Admission: RE | Admit: 2020-10-22 | Discharge: 2020-10-22 | Disposition: A | Discharge status: Home / Self Care | Payer: Medicare Other | Source: Ambulatory Visit | Attending: Family Medicine | Admitting: Family Medicine

## 2020-10-22 ENCOUNTER — Encounter: Payer: Self-pay | Admitting: Family Medicine

## 2020-10-22 ENCOUNTER — Ambulatory Visit (INDEPENDENT_AMBULATORY_CARE_PROVIDER_SITE_OTHER): Payer: Medicare Other | Admitting: Family Medicine

## 2020-10-22 ENCOUNTER — Other Ambulatory Visit: Payer: Self-pay

## 2020-10-22 VITALS — BP 173/91 | HR 87 | Temp 97.7°F | Wt 111.0 lb

## 2020-10-22 DIAGNOSIS — R000 Tachycardia, unspecified: Secondary | ICD-10-CM | POA: Diagnosis not present

## 2020-10-22 DIAGNOSIS — E039 Hypothyroidism, unspecified: Secondary | ICD-10-CM | POA: Diagnosis not present

## 2020-10-22 DIAGNOSIS — Z8279 Family history of other congenital malformations, deformations and chromosomal abnormalities: Secondary | ICD-10-CM | POA: Insufficient documentation

## 2020-10-22 DIAGNOSIS — I10 Essential (primary) hypertension: Secondary | ICD-10-CM | POA: Diagnosis not present

## 2020-10-22 DIAGNOSIS — R918 Other nonspecific abnormal finding of lung field: Secondary | ICD-10-CM | POA: Diagnosis not present

## 2020-10-22 DIAGNOSIS — I700 Atherosclerosis of aorta: Secondary | ICD-10-CM | POA: Insufficient documentation

## 2020-10-22 DIAGNOSIS — I6523 Occlusion and stenosis of bilateral carotid arteries: Secondary | ICD-10-CM | POA: Diagnosis not present

## 2020-10-22 MED ORDER — BENAZEPRIL HCL 10 MG PO TABS: 10.0000 mg | ORAL_TABLET | Freq: Every day | ORAL | 1 refills | Status: DC

## 2020-10-22 MED ORDER — ATENOLOL 25 MG PO TABS: 25.0000 mg | ORAL_TABLET | Freq: Every day | ORAL | 1 refills | Status: DC

## 2020-10-22 MED ORDER — OMEPRAZOLE 20 MG PO CPDR: 20.0000 mg | DELAYED_RELEASE_CAPSULE | Freq: Every day | ORAL | 3 refills | Status: DC

## 2020-10-22 NOTE — Assessment & Plan Note (Signed)
Not under good control. Will increase her benazpril to 10mg  and recheck 4-6 weeks. Call with any concerns.

## 2020-10-22 NOTE — Assessment & Plan Note (Signed)
Tolerating her armour thyroid well. Will recheck labs today and adjust dose as needed. Call with any concerns.

## 2020-10-22 NOTE — Progress Notes (Signed)
BP (!) 173/91   Pulse 87   Temp 97.7 F (36.5 C)   Wt 111 lb (50.3 kg)   BMI 19.67 kg/m    Subjective:    Patient ID: Savannah Mcclure, female    DOB: 1940/10/22, 80 y.o.   MRN: [id number]  HPI: Savannah Mcclure is a 80 y.o. female  Chief Complaint  Patient presents with  . Hypothyroidism    [facility]   . Tachycardia    Follow up. Would like atenolol sent to [facility]   . breast bone concern    Patient has a question regarding breast bone, states she believes it caves in.   HYPOTHYROIDISM Thyroid control status:better Satisfied with current treatment? yes Medication side effects: no Medication compliance: excellent compliance Recent dose adjustment:yes Fatigue: no Cold intolerance: no Heat intolerance: no Weight gain: no Weight loss: no Constipation: no Diarrhea/loose stools: no Palpitations: no Lower extremity edema: no Anxiety/depressed mood: yes  HYPERTENSION Hypertension status: uncontrolled  Satisfied with current treatment? yes Duration of hypertension: chronic BP monitoring frequency:  not checking BP medication side effects:  no Medication compliance: excellent compliance Previous BP meds: benazepril, atenolol Aspirin: no Recurrent headaches: no Visual changes: no Palpitations: no Dyspnea: no Chest pain: no Lower extremity edema: no Dizzy/lightheaded: no   She is concerned about having pectus excavatum. She would like to have an x-ray to make sure she doesn't have it. Her daughter and grandson do.   Relevant past medical, surgical, family and social history reviewed and updated as indicated. Interim medical history since our last visit reviewed. Allergies and medications reviewed and updated.  Review of Systems  Constitutional: Negative.   Respiratory: Negative.   Cardiovascular: Negative.   Gastrointestinal: Negative.   Musculoskeletal: Negative.   Psychiatric/Behavioral:  Negative.     Per HPI unless specifically indicated above     Objective:    BP (!) 173/91   Pulse 87   Temp 97.7 F (36.5 C)   Wt 111 lb (50.3 kg)   BMI 19.67 kg/m   Wt Readings from Last 3 Encounters:  10/22/20 111 lb (50.3 kg)  09/24/20 112 lb 3.2 oz (50.9 kg)  09/10/20 109 lb 12.8 oz (49.8 kg)    Physical Exam Vitals and nursing note reviewed.  Constitutional:      General: She is not in acute distress.    Appearance: Normal appearance. She is not ill-appearing, toxic-appearing or diaphoretic.  HENT:     Head: Normocephalic and atraumatic.     Right Ear: External ear normal.     Left Ear: External ear normal.     Nose: Nose normal.     Mouth/Throat:     Mouth: Mucous membranes are moist.     Pharynx: Oropharynx is clear.  Eyes:     General: No scleral icterus.       Right eye: No discharge.        Left eye: No discharge.     Extraocular Movements: Extraocular movements intact.     Conjunctiva/sclera: Conjunctivae normal.     Pupils: Pupils are equal, round, and reactive to light.  Cardiovascular:     Rate and Rhythm: Normal rate and regular rhythm.     Pulses: Normal pulses.     Heart sounds: Normal heart sounds. No murmur heard. No friction rub. No gallop.   Pulmonary:     Effort: Pulmonary effort is normal. No respiratory distress.     Breath sounds: Normal  breath sounds. No stridor. No wheezing, rhonchi or rales.  Chest:     Chest wall: No tenderness.  Musculoskeletal:        General: Normal range of motion.     Cervical back: Normal range of motion and neck supple.  Skin:    General: Skin is warm and dry.     Capillary Refill: Capillary refill takes less than 2 seconds.     Coloration: Skin is not jaundiced or pale.     Findings: No bruising, erythema, lesion or rash.  Neurological:     General: No focal deficit present.     Mental Status: She is alert and oriented to person, place, and time. Mental status is at baseline.  Psychiatric:        Mood  and Affect: Mood normal.        Behavior: Behavior normal.        Thought Content: Thought content normal.        Judgment: Judgment normal.     Results for orders placed or performed in visit on 09/10/20  GC NAA, Pharyngeal   TH  Result Value Ref Range   N GONORRHOEA RRNA NPH QL PCR Negative Negative  GC NAA, Pharyngeal   TH  Result Value Ref Range   N GONORRHOEA RRNA NPH QL PCR CANCELED   HSV(herpes simplex vrs) 1+2 ab-IgG  Result Value Ref Range   HSV 1 Glycoprotein G Ab, IgG 9.47 (H) 0.00 - 0.90 index   HSV 2 IgG, Type Spec <0.91 0.00 - 0.90 index  RPR  Result Value Ref Range   RPR Ser Ql Non Reactive Non Reactive  Hepatitis panel, acute  Result Value Ref Range   Hep A IgM Negative Negative   Hepatitis B Surface Ag Negative Negative   Hep B C IgM Negative Negative   Hep C Virus Ab <0.1 0.0 - 0.9 s/co ratio  HIV Antibody (routine testing w rflx)  Result Value Ref Range   HIV Screen 4th Generation wRfx Non Reactive Non Reactive  Allergen Panel (27) + IGE  Result Value Ref Range   Class Description Allergens Comment    IgE (Immunoglobulin E), Serum 14 6 - 495 IU/mL   D Pteronyssinus IgE <0.10 Class 0 kU/L   D Farinae IgE <0.10 Class 0 kU/L   Cat Dander IgE <0.10 Class 0 kU/L   Dog Dander IgE <0.10 Class 0 kU/L   [place] Grass IgE <0.10 Class 0 kU/L   Timothy Grass IgE <0.10 Class 0 kU/L   [name] Bluegrass IgE <0.10 Class 0 kU/L   Thaila Bottoms Grass IgE <0.10 Class 0 kU/L   [place]ia Grass IgE <0.10 Class 0 kU/L   Cockroach, American IgE <0.10 Class 0 kU/L   Penicillium Chrysogen IgE <0.10 Class 0 kU/L   Cladosporium Herbarum IgE <0.10 Class 0 kU/L   Aspergillus Fumigatus IgE <0.10 Class 0 kU/L   Mucor Racemosus IgE <0.10 Class 0 kU/L   Alternaria Alternata IgE <0.10 Class 0 kU/L   Setomelanomma Rostrat <0.10 Class 0 kU/L   Oak, White IgE <0.10 Class 0 kU/L   Elm, American IgE <0.10 Class 0 kU/L   Maple/Box Elder IgE <0.10 Class 0 kU/L   Common Silver Wendee Copp IgE <0.10  Class 0 kU/L   Hickory, White IgE <0.10 Class 0 kU/L   White Mulberry IgE <0.10 Class 0 kU/L   Cedar, [place] IgE <0.10 Class 0 kU/L   Ragweed, Short IgE <0.10 Class 0 kU/L   Plantain, English IgE <  0.10 Class 0 kU/L   Cocklebur IgE <0.10 Class 0 kU/L   Pigweed, Rough IgE <0.10 Class 0 kU/L  CBC with Differential/Platelet  Result Value Ref Range   WBC 12.7 (H) 3.4 - 10.8 x10E3/uL   RBC 4.42 3.77 - 5.28 x10E6/uL   Hemoglobin 13.4 11.1 - 15.9 g/dL   Hematocrit 40.4 34.0 - 46.6 %   MCV 91 79 - 97 fL   MCH 30.3 26.6 - 33.0 pg   MCHC 33.2 31.5 - 35.7 g/dL   RDW 12.5 11.7 - 15.4 %   Platelets 500 (H) 150 - 450 x10E3/uL   Neutrophils 68 Not Estab. %   Lymphs 23 Not Estab. %   Monocytes 6 Not Estab. %   Eos 2 Not Estab. %   Basos 1 Not Estab. %   Neutrophils Absolute 8.7 (H) 1.4 - 7.0 x10E3/uL   Lymphocytes Absolute 2.9 0.7 - 3.1 x10E3/uL   Monocytes Absolute 0.8 0.1 - 0.9 x10E3/uL   EOS (ABSOLUTE) 0.2 0.0 - 0.4 x10E3/uL   Basophils Absolute 0.1 0.0 - 0.2 x10E3/uL   Immature Granulocytes 0 Not Estab. %   Immature Grans (Abs) 0.0 0.0 - 0.1 W41L2/GM  Basic metabolic panel  Result Value Ref Range   Glucose 97 65 - 99 mg/dL   BUN 8 8 - 27 mg/dL   Creatinine, Ser 0.73 0.57 - 1.00 mg/dL   GFR calc non Af Amer 78 >59 mL/min/1.73   GFR calc Af Amer 90 >59 mL/min/1.73   BUN/Creatinine Ratio 11 (L) 12 - 28   Sodium 140 134 - 144 mmol/L   Potassium 3.5 3.5 - 5.2 mmol/L   Chloride 98 96 - 106 mmol/L   CO2 25 20 - 29 mmol/L   Calcium 9.2 8.7 - 10.3 mg/dL  TSH  Result Value Ref Range   TSH 8.540 (H) 0.450 - 4.500 uIU/mL      Assessment & Plan:   Problem List Items Addressed This Visit      Cardiovascular and Mediastinum   HTN (hypertension)    Not under good control. Will increase her benazpril to 10mg  and recheck 4-6 weeks. Call with any concerns.       Relevant Medications   atenolol (TENORMIN) 25 MG tablet   benazepril (LOTENSIN) 10 MG tablet   Other Relevant Orders   Basic  metabolic panel     Endocrine   Acquired hypothyroidism - Primary    Tolerating her armour thyroid well. Will recheck labs today and adjust dose as needed. Call with any concerns.       Relevant Medications   atenolol (TENORMIN) 25 MG tablet    Other Visit Diagnoses    Tachycardia       Under much better control. Continue atenolol. Call with any concerns.    Family history of congenital anomalies       Concern for pectus excavatum. Will check x-ray. Await results.    Relevant Orders   DG Chest 2 View       Follow up plan: Return 4-6 weeks.

## 2020-10-23 ENCOUNTER — Telehealth: Payer: Self-pay | Admitting: Family Medicine

## 2020-10-23 ENCOUNTER — Other Ambulatory Visit: Payer: Self-pay | Admitting: Family Medicine

## 2020-10-23 DIAGNOSIS — E039 Hypothyroidism, unspecified: Secondary | ICD-10-CM

## 2020-10-23 DIAGNOSIS — R9389 Abnormal findings on diagnostic imaging of other specified body structures: Secondary | ICD-10-CM

## 2020-10-23 DIAGNOSIS — R000 Tachycardia, unspecified: Secondary | ICD-10-CM

## 2020-10-23 LAB — BASIC METABOLIC PANEL WITH GFR
BUN/Creatinine Ratio: 10 — ABNORMAL LOW (ref 12–28)
BUN: 8 mg/dL (ref 8–27)
CO2: 25 mmol/L (ref 20–29)
Calcium: 9.6 mg/dL (ref 8.7–10.3)
Chloride: 99 mmol/L (ref 96–106)
Creatinine, Ser: 0.84 mg/dL (ref 0.57–1.00)
Glucose: 76 mg/dL (ref 65–99)
Potassium: 4 mmol/L (ref 3.5–5.2)
Sodium: 142 mmol/L (ref 134–144)
eGFR: 70 mL/min/1.73

## 2020-10-23 LAB — TSH: TSH: 16 u[IU]/mL — ABNORMAL HIGH (ref 0.450–4.500)

## 2020-10-23 MED ORDER — THYROID 60 MG PO TABS: 60.0000 mg | ORAL_TABLET | Freq: Every day | ORAL | 1 refills | Status: DC

## 2020-10-23 NOTE — Telephone Encounter (Signed)
Called patient about her results. We will get her scheduled for CT chest due to abnormal CXR. Ordered today.

## 2020-10-27 ENCOUNTER — Other Ambulatory Visit: Payer: Medicare Other

## 2020-11-03 DIAGNOSIS — I700 Atherosclerosis of aorta: Secondary | ICD-10-CM | POA: Insufficient documentation

## 2020-11-04 ENCOUNTER — Other Ambulatory Visit: Payer: Self-pay

## 2020-11-04 ENCOUNTER — Ambulatory Visit
Admission: RE | Admit: 2020-11-04 | Discharge: 2020-11-04 | Disposition: A | Discharge status: Home / Self Care | Payer: Medicare Other | Source: Ambulatory Visit | Attending: Family Medicine | Admitting: Family Medicine

## 2020-11-04 DIAGNOSIS — R9389 Abnormal findings on diagnostic imaging of other specified body structures: Secondary | ICD-10-CM | POA: Diagnosis not present

## 2020-11-04 DIAGNOSIS — R0602 Shortness of breath: Secondary | ICD-10-CM | POA: Diagnosis not present

## 2020-11-06 ENCOUNTER — Other Ambulatory Visit: Payer: Self-pay | Admitting: Family Medicine

## 2020-11-06 MED ORDER — LEVOFLOXACIN 750 MG PO TABS: 750.0000 mg | ORAL_TABLET | Freq: Every day | ORAL | 0 refills | Status: DC

## 2020-11-13 ENCOUNTER — Other Ambulatory Visit: Payer: Self-pay | Admitting: Family Medicine

## 2020-11-16 ENCOUNTER — Encounter: Payer: Self-pay | Admitting: Family Medicine

## 2020-11-16 ENCOUNTER — Ambulatory Visit (INDEPENDENT_AMBULATORY_CARE_PROVIDER_SITE_OTHER): Payer: Medicare Other | Admitting: Family Medicine

## 2020-11-16 ENCOUNTER — Other Ambulatory Visit: Payer: Self-pay

## 2020-11-16 VITALS — BP 133/73 | HR 94 | Temp 97.9°F | Wt 113.6 lb

## 2020-11-16 DIAGNOSIS — J189 Pneumonia, unspecified organism: Secondary | ICD-10-CM

## 2020-11-16 DIAGNOSIS — I10 Essential (primary) hypertension: Secondary | ICD-10-CM

## 2020-11-16 DIAGNOSIS — E039 Hypothyroidism, unspecified: Secondary | ICD-10-CM

## 2020-11-16 DIAGNOSIS — I6523 Occlusion and stenosis of bilateral carotid arteries: Secondary | ICD-10-CM | POA: Diagnosis not present

## 2020-11-16 MED ORDER — CILOSTAZOL 100 MG PO TABS: 100.0000 mg | ORAL_TABLET | Freq: Two times a day (BID) | ORAL | 1 refills | Status: DC

## 2020-11-16 NOTE — Assessment & Plan Note (Signed)
Under good control on current regimen. Continue current regimen. Continue to monitor. Call with any concerns. Refills given. Labs drawn today.   

## 2020-11-16 NOTE — Patient Instructions (Signed)
Go the week for 4/25 to get chest x-ray

## 2020-11-16 NOTE — Assessment & Plan Note (Signed)
Rechecking labs today. Await results. Treat as needed.  °

## 2020-11-16 NOTE — Progress Notes (Signed)
BP 133/73   Pulse 94   Temp 97.9 F (36.6 C) (Oral)   Wt 113 lb 9.6 oz (51.5 kg)   BMI 20.13 kg/m    Subjective:    Patient ID: Savannah Mcclure, female    DOB: 1941/06/03, 80 y.o.   MRN: [id number]  HPI: Savannah Mcclure is a 80 y.o. female  Chief Complaint  Patient presents with  . Hypertension   Feeling better. Much less tired. Feeling more like herself. Still weak in her legs. Finished her abx yesterday.   HYPERTENSION Hypertension status: controlled  Satisfied with current treatment? yes Duration of hypertension: chronic BP monitoring frequency:  not checking BP medication side effects:  no Medication compliance: excellent compliance Previous BP meds:benazepril, atenolol Aspirin: no Recurrent headaches: no Visual changes: no Palpitations: no Dyspnea: no Chest pain: no Lower extremity edema: no Dizzy/lightheaded: no  HYPOTHYROIDISM Thyroid control status:controlled Satisfied with current treatment? yes Medication side effects: no Medication compliance: excellent compliance Recent dose adjustment:yes Fatigue: no Cold intolerance: no Heat intolerance: no Weight gain: no Weight loss: no Constipation: no Diarrhea/loose stools: no Palpitations: no Lower extremity edema: no Anxiety/depressed mood: no  Relevant past medical, surgical, family and social history reviewed and updated as indicated. Interim medical history since our last visit reviewed. Allergies and medications reviewed and updated.  Review of Systems  Constitutional: Negative.   Respiratory: Negative.   Cardiovascular: Negative.   Gastrointestinal: Negative.   Musculoskeletal: Negative.   Neurological: Positive for weakness. Negative for dizziness, tremors, seizures, syncope, facial asymmetry, speech difficulty, light-headedness, numbness and headaches.  Psychiatric/Behavioral: Negative.     Per HPI unless specifically indicated above     Objective:    BP 133/73    Pulse 94   Temp 97.9 F (36.6 C) (Oral)   Wt 113 lb 9.6 oz (51.5 kg)   BMI 20.13 kg/m   Wt Readings from Last 3 Encounters:  11/16/20 113 lb 9.6 oz (51.5 kg)  10/22/20 111 lb (50.3 kg)  09/24/20 112 lb 3.2 oz (50.9 kg)    Physical Exam Vitals and nursing note reviewed.  Constitutional:      General: She is not in acute distress.    Appearance: Normal appearance. She is not ill-appearing, toxic-appearing or diaphoretic.  HENT:     Head: Normocephalic and atraumatic.     Right Ear: External ear normal.     Left Ear: External ear normal.     Nose: Nose normal.     Mouth/Throat:     Mouth: Mucous membranes are moist.     Pharynx: Oropharynx is clear.  Eyes:     General: No scleral icterus.       Right eye: No discharge.        Left eye: No discharge.     Extraocular Movements: Extraocular movements intact.     Conjunctiva/sclera: Conjunctivae normal.     Pupils: Pupils are equal, round, and reactive to light.  Cardiovascular:     Rate and Rhythm: Normal rate and regular rhythm.     Pulses: Normal pulses.     Heart sounds: Normal heart sounds. No murmur heard. No friction rub. No gallop.   Pulmonary:     Effort: Pulmonary effort is normal. No respiratory distress.     Breath sounds: Normal breath sounds. No stridor. No wheezing, rhonchi or rales.  Chest:     Chest wall: No tenderness.  Musculoskeletal:        General: Normal range of motion.  Cervical back: Normal range of motion and neck supple.  Skin:    General: Skin is warm and dry.     Capillary Refill: Capillary refill takes less than 2 seconds.     Coloration: Skin is not jaundiced or pale.     Findings: No bruising, erythema, lesion or rash.  Neurological:     General: No focal deficit present.     Mental Status: She is alert and oriented to person, place, and time. Mental status is at baseline.  Psychiatric:        Mood and Affect: Mood normal.        Behavior: Behavior normal.        Thought Content:  Thought content normal.        Judgment: Judgment normal.     Results for orders placed or performed in visit on 10/22/20  TSH  Result Value Ref Range   TSH 16.000 (H) 0.450 - 4.500 uIU/mL  Basic metabolic panel  Result Value Ref Range   Glucose 76 65 - 99 mg/dL   BUN 8 8 - 27 mg/dL   Creatinine, Ser 0.84 0.57 - 1.00 mg/dL   eGFR 70 >59 mL/min/1.73   BUN/Creatinine Ratio 10 (L) 12 - 28   Sodium 142 134 - 144 mmol/L   Potassium 4.0 3.5 - 5.2 mmol/L   Chloride 99 96 - 106 mmol/L   CO2 25 20 - 29 mmol/L   Calcium 9.6 8.7 - 10.3 mg/dL      Assessment & Plan:   Problem List Items Addressed This Visit      Cardiovascular and Mediastinum   HTN (hypertension)    Under good control on current regimen. Continue current regimen. Continue to monitor. Call with any concerns. Refills given. Labs drawn today.        Relevant Orders   Basic metabolic panel     Endocrine   Acquired hypothyroidism    Rechecking labs today. Await results. Treat as needed.        Other Visit Diagnoses    Pneumonia of both lower lobes due to infectious organism    -  Primary   Just finished her abx. Will recheck CXR in 3 weeks. Call with any concerns.    Relevant Orders   DG Chest 2 View   CBC with Differential/Platelet       Follow up plan: Return in about 6 months (around 05/18/2021).

## 2020-11-17 ENCOUNTER — Other Ambulatory Visit: Payer: Self-pay | Admitting: Family Medicine

## 2020-11-17 DIAGNOSIS — E039 Hypothyroidism, unspecified: Secondary | ICD-10-CM

## 2020-11-17 LAB — CBC WITH DIFFERENTIAL/PLATELET
Basophils Absolute: 0.1 10*3/uL (ref 0.0–0.2)
Basos: 1 %
EOS (ABSOLUTE): 0.1 10*3/uL (ref 0.0–0.4)
Eos: 2 %
Hematocrit: 39.1 % (ref 34.0–46.6)
Hemoglobin: 12.8 g/dL (ref 11.1–15.9)
Immature Grans (Abs): 0 10*3/uL (ref 0.0–0.1)
Immature Granulocytes: 0 %
Lymphocytes Absolute: 2.2 10*3/uL (ref 0.7–3.1)
Lymphs: 28 %
MCH: 30.6 pg (ref 26.6–33.0)
MCHC: 32.7 g/dL (ref 31.5–35.7)
MCV: 94 fL (ref 79–97)
Monocytes Absolute: 0.8 10*3/uL (ref 0.1–0.9)
Monocytes: 10 %
Neutrophils Absolute: 4.8 10*3/uL (ref 1.4–7.0)
Neutrophils: 59 %
Platelets: 409 10*3/uL (ref 150–450)
RBC: 4.18 x10E6/uL (ref 3.77–5.28)
RDW: 13.1 % (ref 11.7–15.4)
WBC: 8 10*3/uL (ref 3.4–10.8)

## 2020-11-17 LAB — BASIC METABOLIC PANEL
BUN/Creatinine Ratio: 13 (ref 12–28)
BUN: 11 mg/dL (ref 8–27)
CO2: 20 mmol/L (ref 20–29)
Calcium: 9.4 mg/dL (ref 8.7–10.3)
Chloride: 102 mmol/L (ref 96–106)
Creatinine, Ser: 0.83 mg/dL (ref 0.57–1.00)
Glucose: 78 mg/dL (ref 65–99)
Potassium: 4.1 mmol/L (ref 3.5–5.2)
Sodium: 142 mmol/L (ref 134–144)
eGFR: 71 mL/min/{1.73_m2} (ref >59)

## 2020-11-17 LAB — TSH: TSH: 5.5 u[IU]/mL — ABNORMAL HIGH (ref 0.450–4.500)

## 2020-11-17 MED ORDER — THYROID 90 MG PO TABS: 90.0000 mg | ORAL_TABLET | Freq: Every day | ORAL | 1 refills | Status: DC

## 2020-12-03 ENCOUNTER — Ambulatory Visit: Payer: Medicare Other | Admitting: Family Medicine

## 2020-12-04 DIAGNOSIS — E063 Autoimmune thyroiditis: Secondary | ICD-10-CM | POA: Diagnosis not present

## 2020-12-07 ENCOUNTER — Ambulatory Visit
Admission: RE | Admit: 2020-12-07 | Discharge: 2020-12-07 | Disposition: A | Discharge status: Home / Self Care | Payer: Medicare Other | Source: Ambulatory Visit | Attending: Family Medicine | Admitting: Family Medicine

## 2020-12-07 ENCOUNTER — Other Ambulatory Visit: Payer: Self-pay

## 2020-12-07 ENCOUNTER — Ambulatory Visit
Admission: RE | Admit: 2020-12-07 | Discharge: 2020-12-07 | Disposition: A | Discharge status: Home / Self Care | Payer: Medicare Other | Attending: Family Medicine | Admitting: Family Medicine

## 2020-12-07 ENCOUNTER — Telehealth: Payer: Self-pay | Admitting: Student in an Organized Health Care Education/Training Program

## 2020-12-07 ENCOUNTER — Ambulatory Visit: Payer: Medicare Other | Admitting: Family Medicine

## 2020-12-07 DIAGNOSIS — J189 Pneumonia, unspecified organism: Secondary | ICD-10-CM | POA: Insufficient documentation

## 2020-12-07 NOTE — Telephone Encounter (Signed)
Patient notified that the prescription was sent to [facility].  She states that that is ok because theya re actually cheaper.

## 2020-12-07 NOTE — Telephone Encounter (Signed)
Patient is calling about med refills ? Says they should have been sent into mail pharmacy [facility] ? She says she has not gotten any meds since feb ? Please call pateint

## 2020-12-11 ENCOUNTER — Other Ambulatory Visit: Payer: Self-pay | Admitting: Family Medicine

## 2020-12-11 DIAGNOSIS — J189 Pneumonia, unspecified organism: Secondary | ICD-10-CM

## 2020-12-22 ENCOUNTER — Other Ambulatory Visit: Payer: Self-pay

## 2020-12-22 ENCOUNTER — Encounter: Payer: Self-pay | Admitting: Pulmonary Disease

## 2020-12-22 ENCOUNTER — Ambulatory Visit (INDEPENDENT_AMBULATORY_CARE_PROVIDER_SITE_OTHER): Payer: Medicare Other | Admitting: Pulmonary Disease

## 2020-12-22 VITALS — BP 134/78 | HR 100 | Temp 97.1°F | Ht 62.0 in | Wt 111.0 lb

## 2020-12-22 DIAGNOSIS — K219 Gastro-esophageal reflux disease without esophagitis: Secondary | ICD-10-CM

## 2020-12-22 DIAGNOSIS — R918 Other nonspecific abnormal finding of lung field: Secondary | ICD-10-CM

## 2020-12-22 DIAGNOSIS — M4124 Other idiopathic scoliosis, thoracic region: Secondary | ICD-10-CM | POA: Diagnosis not present

## 2020-12-22 DIAGNOSIS — F17210 Nicotine dependence, cigarettes, uncomplicated: Secondary | ICD-10-CM | POA: Diagnosis not present

## 2020-12-22 DIAGNOSIS — J449 Chronic obstructive pulmonary disease, unspecified: Secondary | ICD-10-CM

## 2020-12-22 NOTE — Progress Notes (Signed)
Subjective:    Patient ID: Savannah Mcclure, female    DOB: [birth year], 80 y.o.   MRN: [id number]  HPI The patient is an 80 year old current smoker (half PPD) who presents for evaluation of potential chronic lung infection as noted on chest CT.  She is currently raised referred by Dr. Park Liter.  Findings were noted on incidental imaging ordered on a visit of 22 October 2020.  At that time the patient had the concerned that her breastbone was caving in.  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  At that time showed right upper lobe changes that could be secondary to bronchiectasis with mucus impaction.  CT was obtained on 23 March which showed findings consistent with potential MAI.  The patient has been asymptomatic with regards to this.  She has a chronic cough however she continues to smoke.  She has not had any change in the character of her cough.  She did receive antibiotics which were followed by another chest x-ray performed on 07 December 2020 that showed persistence of the findings though perhaps somewhat better.  MAI tends to be waxing and waning.  The patient does have significant gastroesophageal reflux symptoms and does not follow adequate antireflux measures.  She has not had any fevers, chills or sweats.  No hemoptysis.  She has not had any orthopnea, paroxysmal nocturnal dyspnea or lower extremity edema. Patient does not describe any weight loss or anorexia.  The patient has not had significant occupational exposure though was exposed to renovations in school she worked in.  Worked as a Forensic psychologist.  There are dogs and cats in the home.  She has not served in the [employer].  She has previously resided in [place], [place], [place], [place], [place], [place], [place] and [place].  Most of these in childhood.  She has not had any exotic travel.  She enjoys gardening.   Review of  Systems A 10 point review of systems was performed and it is as noted above otherwise negative.  Past Medical History:  Diagnosis Date  . Abdominal distension 03/18/2012  . Abnormal weight loss 10/30/2017  . Acute kidney injury ([place]) 03/22/2012  . 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 Anxiety   . Blind right eye   . Cardiac arrest ([place])   . Cataract    right eye  . Claudication ([place])   . Complication of anesthesia 03/15/2012   "crashed" during first part of 3-part back surgery.   Marland Kitchen COPD (chronic obstructive pulmonary disease) ([place])   . Delirium 03/18/2012  . Dyspnea   . GERD (gastroesophageal reflux disease)   . Heart murmur   . Hypertension   . Leaky heart valve   . Oxygen deficit    uses O2 at night 2 liters  . Peripheral vascular disease ([place])   . Scoliosis   . Sepsis ([place])   . Shock ([place]) 03/23/2012  . SIRS (systemic inflammatory response syndrome) ([place]) 03/23/2012  . Spinal stenosis   . Stroke [facility])    Eye  . Tachycardia    Past Surgical History:  Procedure Laterality Date  . ABDOMINAL HYSTERECTOMY     total  . BACK SURGERY    . BREAST LUMPECTOMY Left   . CATARACT EXTRACTION W/PHACO Left 08/14/2019   Procedure: CATARACT EXTRACTION PHACO AND INTRAOCULAR LENS PLACEMENT (IOC) LEFT 10.22 00:59.7 42.4%;  Surgeon: Leandrew Koyanagi, MD;  Location: [facility];  Service: Ophthalmology;  Laterality:  Left;  leave it last patient per [place]  . LOWER EXTREMITY ANGIOGRAPHY Right 06/20/2018   Procedure: LOWER EXTREMITY ANGIOGRAPHY;  Surgeon: Algernon Huxley, MD;  Location: [facility];  Service: Cardiovascular;  Laterality: Right;  . LOWER EXTREMITY ANGIOGRAPHY Left 02/03/2020   Procedure: LOWER EXTREMITY ANGIOGRAPHY;  Surgeon: Algernon Huxley, MD;  Location: [facility];  Service: Cardiovascular;  Laterality: Left;  . 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 WISDOM TOOTH EXTRACTION     Family History  Problem Relation Age of Onset  . Intracerebral  hemorrhage Mother   . Hypertension Mother   . Heart failure Father   . Schizophrenia Daughter   . Rheum arthritis Maternal Grandmother   . Kidney disease Maternal Grandmother   . Heart disease Paternal Grandmother   . Stroke Paternal Grandmother   . Stroke Paternal Grandfather   . Heart disease Paternal Grandfather    Social History   Tobacco Use  . Smoking status: Current Every Day Smoker    Packs/day: 1.00    Years: 40.00    Pack years: 40.00    Types: Cigarettes  . Smokeless tobacco: Never Used  . Tobacco comment: 0.5PPD 12/22/20  Substance Use Topics  . Alcohol use: Yes    Comment: wine on occasion (1x/month)   Allergies  Allergen Reactions  . Tramadol Rash  . Other Itching    Cat Gut sutures   . Oxycodone Hives  . Amlodipine Hives  . Augmentin [Amoxicillin-Pot Clavulanate] Diarrhea  . Azithromycin Hives  . Contrast Media [Iodinated Diagnostic Agents] Hives  . Doxycycline Diarrhea  . Erythromycin Diarrhea  . Hydrocodone Nausea Only  . Lactose Intolerance (Gi) Diarrhea    Bloating  . Minocycline Diarrhea  . Nsaids Other (See Comments)    Internal Bleeding  . Relafen [Nabumetone] Hives  . Sulfa Antibiotics Hives  . Tetracyclines & Related Diarrhea   Current Meds  Medication Sig  . acetaminophen (TYLENOL) 500 MG tablet Take 500 mg by mouth every 6 (six) hours as needed.  Marland Kitchen atenolol (TENORMIN) 25 MG tablet Take 1 tablet (25 mg total) by mouth daily.  . B-COMPLEX-C PO Take 1 tablet by mouth daily.  . benazepril (LOTENSIN) 10 MG tablet Take 1 tablet (10 mg total) by mouth daily.  . cilostazol (PLETAL) 100 MG tablet Take 1 tablet (100 mg total) by mouth 2 (two) times daily.  . cyclobenzaprine (FLEXERIL) 5 MG tablet Take 5 mg by mouth 3 (three) times daily as needed for muscle spasms.   . diphenhydrAMINE (BENADRYL) 25 MG tablet Take 25 mg by mouth every 6 (six) hours as needed.   . DUREZOL 0.05 % EMUL Place into the right eye every morning.   . gabapentin  (NEURONTIN) 300 MG capsule Take 1 capsule (300 mg total) by mouth 2 (two) times daily.  . hydrOXYzine (ATARAX/VISTARIL) 25 MG tablet Take 1 tablet (25 mg total) by mouth 3 (three) times daily as needed.  . lidocaine (LIDODERM) 5 % Place 1 patch onto the skin daily. Remove & Discard patch within 12 hours or as directed by MD  . Misc Natural Products ([place]) Take by mouth daily.  . montelukast (SINGULAIR) 10 MG tablet Take 1 tablet (10 mg total) by mouth at bedtime.  . Multiple Vitamins-Minerals (MULTIVITAMIN WITH MINERALS) tablet Take 1 tablet by mouth daily.  Marland Kitchen NIACINAMIDE PO Take 1 capsule by mouth daily.  Marland Kitchen omeprazole (PRILOSEC) 20 MG capsule Take  1 capsule (20 mg total) by mouth daily.  Marland Kitchen thyroid (ARMOUR THYROID) 90 MG tablet Take 1 tablet (90 mg total) by mouth daily before breakfast.  . tiotropium (SPIRIVA HANDIHALER) 18 MCG inhalation capsule PLACE 1 CAPSULE INTO INHALER AND INHALE THE CONTENTS DAILY  . traMADol (ULTRAM) 50 MG tablet Take by mouth.  Marland Kitchen UNABLE TO FIND Take 1 tablet by mouth 2 (two) times daily. Cardio FX  . UNABLE TO FIND Take 1 tablet by mouth daily. Trinity    Immunization History  Administered Date(s) Administered  . Fluad Quad(high Dose 65+) 04/25/2019, 06/01/2020  . Influenza, High Dose Seasonal PF 04/23/2018  . Influenza-Unspecified 06/15/2017  . PFIZER(Purple Top)SARS-COV-2 Vaccination 08/22/2019, 09/14/2019, 05/04/2020  . Pneumococcal Conjugate-13 09/06/2018  . Pneumococcal Polysaccharide-23 03/09/2020  . Td 12/12/2017       Objective:   Physical Exam BP 134/78 (BP Location: Left Arm, Cuff Size: Normal)   Pulse 100   Temp (!) 97.1 F (36.2 C) (Temporal)   Ht _0  (1.575 m)   Wt 111 lb (50.3 kg)   SpO2 97%   BMI 20.30 kg/m   GENERAL: Thin well-developed woman, no acute distress. HEAD: Normocephalic, atraumatic.  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  No scleral icterus.  MOUTH: Nose/mouth/throat not examined due to  masking requirements for COVID 19. NECK: Supple. No thyromegaly. Trachea midline. No JVD.  No adenopathy. PULMONARY: Good air entry bilaterally.  Coarse otherwise, no adventitious sounds. CARDIOVASCULAR: S1 and S2. Regular rate and rhythm.  No rubs, murmurs or gallops heard. ABDOMEN: Benign. MUSCULOSKELETAL: Thoracic dextroscoliosis, no clubbing, no edema.  NEUROLOGIC: No focal deficit, no gait disturbance, speech is fluent. SKIN: Intact,warm,dry.  On limited exam no rashes or skin lesions. PSYCH: Mood and behavior normal.  Representative images of the CT performed 11/04/2020, independently reviewed, reviewed with the patient:         Chest x-ray performed 07 December 2020, independently reviewed:   Assessment & Plan:     ICD-10-CM   1. 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 COPD suggested by initial evaluation [facility])  J44.9 Pulmonary Function Test [facility] Only   PFTs to further evaluate May need triple therapy Currently only on LAMA and as needed SAMA  3. Gastroesophageal reflux disease, unspecified whether esophagitis present  K21.9    Patient symptomatic Not following antireflux measures Discussed antireflux measures Continue Prilosec  4. Other idiopathic scoliosis, thoracic region  M41.24    This issue adds complexity to her management   5. Tobacco dependence due to cigarettes  F17.210    Patient counseled regards to discontinuation of smoking Total counseling time 3 to 5 minutes   Orders Placed This Encounter  Procedures  . CT CHEST WO CONTRAST    Standing Status:   Future    Standing Expiration Date:   12/22/2021    Scheduling Instructions:     04/2021    Order Specific Question:   Preferred imaging location?    Answer:   [facility]  . 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  These findings will have to be followed up currently.  Additionally, chronic silent aspiration could have a similar appearance.  Patient was advised of proper antireflux measures.  Currently I do not believe that she needs any further antimicrobials we will repeat CT scan of the chest in September which will be 6 months from her initial CT.  She will also undergo pulmonary function testing to evaluate for COPD.  Renold Don, MD [facility]   *This note was dictated using voice recognition software/Dragon.  Despite best efforts to proofread, errors can occur which can change the meaning.  Any change was purely unintentional.

## 2020-12-22 NOTE — Patient Instructions (Signed)
We are going to get breathing tests.  We are going to have you collect your sputum so that we can have it cultured.  We will repeat CT scan of the chest in September.  We will see you in follow-up in 2 months time call sooner should any new problems arise.  This will be to follow-up on your breathing tests.

## 2020-12-23 ENCOUNTER — Ambulatory Visit
Payer: Medicare Other | Attending: Student in an Organized Health Care Education/Training Program | Admitting: Student in an Organized Health Care Education/Training Program

## 2020-12-23 ENCOUNTER — Encounter: Payer: Self-pay | Admitting: Student in an Organized Health Care Education/Training Program

## 2020-12-23 VITALS — BP 144/78 | HR 122 | Temp 97.1°F | Resp 16 | Ht 61.0 in | Wt 111.0 lb

## 2020-12-23 DIAGNOSIS — G894 Chronic pain syndrome: Secondary | ICD-10-CM | POA: Diagnosis not present

## 2020-12-23 DIAGNOSIS — M47816 Spondylosis without myelopathy or radiculopathy, lumbar region: Secondary | ICD-10-CM | POA: Diagnosis not present

## 2020-12-23 DIAGNOSIS — M4120 Other idiopathic scoliosis, site unspecified: Secondary | ICD-10-CM | POA: Diagnosis not present

## 2020-12-23 DIAGNOSIS — M47814 Spondylosis without myelopathy or radiculopathy, thoracic region: Secondary | ICD-10-CM | POA: Insufficient documentation

## 2020-12-23 MED ORDER — TRAMADOL HCL 50 MG PO TABS: 50.0000 mg | ORAL_TABLET | Freq: Four times a day (QID) | ORAL | 1 refills | Status: AC | PRN

## 2020-12-23 NOTE — Progress Notes (Signed)
PROVIDER NOTE: Information contained herein reflects review and annotations entered in association with encounter. Interpretation of such information and data should be left to medically-trained personnel. Information provided to patient can be located elsewhere in the medical record under "Patient Instructions". Document created using STT-dictation technology, any transcriptional errors that may result from process are unintentional.    Patient: Savannah Mcclure  Service Category: E/M  Provider: Gillis Santa, MD  DOB: 1940-11-08  DOS: 12/23/2020  Specialty: Interventional Pain Management  MRN: [id number]  Setting: Ambulatory outpatient  PCP: [name], DO  Type: Established Patient    Referring Provider: [name], DO  Location: Office  Delivery: Face-to-face     HPI  [name], a 80 y.o. year old female, is here today because of her Chronic pain syndrome [G89.4]. [name]'s primary complain today is Back Pain Last encounter: My last encounter with her was on 12/07/2020. Pertinent problems: Ms. Berryman has HTN (hypertension); COPD (chronic obstructive pulmonary disease) ([place]); Idiopathic scoliosis and kyphoscoliosis; Spinal stenosis of lumbar region with neurogenic claudication; PAD (peripheral artery disease) ([place]); Chronic left shoulder pain; Pain of both hip joints; Chronic pain syndrome; Spondylosis without myelopathy or radiculopathy, lumbar region; and Thoracic spondylosis without myelopathy on their pertinent problem list. Pain Assessment: Severity of Chronic pain is reported as a 3 /10. Location: Back Medial,Upper/Denies. Onset: More than a month ago. Quality: Constant ("aggrevating"). Timing: Constant. Modifying factor(s): Reclining, tramadol. Vitals:  height is 5' 1" (1.549 m) and weight is 111 lb (50.3 kg). Her temporal temperature is 97.1 F (36.2 C) (abnormal). Her blood pressure is 144/78 (abnormal) and her pulse is 122 (abnormal). Her  respiration is 16 and oxygen saturation is 95%.   Reason for encounter: medication management.    No change in medical history since last visit.  Patient's pain is at baseline.  Patient continues  pain regimen as prescribed.  States that it provides pain relief and improvement in functional status.   Pharmacotherapy Assessment   09/15/2020  04/28/2020   1  Tramadol Hcl 50 Mg Tablet  120.00  30  Un Pha  [id number]  Sou ([id number])  1/1  20.00 MME  Comm Ins  [place]      Analgesic: Tramadol 50 mg QID prn   Monitoring: [place] PMP: PDMP reviewed during this encounter.       Pharmacotherapy: No side-effects or adverse reactions reported. Compliance: No problems identified. Effectiveness: Clinically acceptable.  Janne Napoleon, RN  12/23/2020  2:09 PM  Sign when Signing Visit Safety precautions to be maintained throughout the outpatient stay will include: orient to surroundings, keep bed in low position, maintain call bell within reach at all times, provide assistance with transfer out of bed and ambulation.   Nursing Pain Medication Assessment:  Safety precautions to be maintained throughout the outpatient stay will include: orient to surroundings, keep bed in low position, maintain call bell within reach at all times, provide assistance with transfer out of bed and ambulation.  Medication Inspection Compliance: Pill count conducted under aseptic conditions, in front of the patient. Neither the pills nor the bottle was removed from the patient's sight at any time. Once count was completed pills were immediately returned to the patient in their original bottle.  Medication: Tramadol (Ultram) Pill/Patch Count: 16 of 120 Pill/Patch Appearance: Markings consistent with prescribed medication Bottle Appearance: Standard pharmacy container. Clearly labeled. Filled Date: 02 / 01 / 2022 Last Medication intake:  Today    UDS:  Summary  Date Value Ref Range Status  04/24/2019 Note  Final    Comment:     ==================================================================== ToxASSURE Select 13 (MW) ==================================================================== Test                             Result       Flag       Units Drug Absent but Declared for Prescription Verification   Tramadol                       Not Detected UNEXPECTED ng/mg creat ==================================================================== Test                      Result    Flag   Units      Ref Range   Creatinine              53               mg/dL      >=20 ==================================================================== Declared Medications:  The flagging and interpretation on this report are based on the  following declared medications.  Unexpected results may arise from  inaccuracies in the declared medications.  **Note: The testing scope of this panel includes these medications:  Tramadol  **Note: The testing scope of this panel does not include the  following reported medications:  Albuterol (Duoneb)  Benazepril  Cefuroxime  Cilostazol (Pletal)  Cyclobenzaprine  Gabapentin  Hydroxyzine  Ipratropium (Duoneb)  Magnesium  Montelukast  Multivitamin  Niacinamide  Omeprazole  Potassium  Supplement  Tiotropium  Topical Lidocaine  Vitamin B ==================================================================== For clinical consultation, please call [phone]. ====================================================================      ROS  Constitutional: Denies any fever or chills Gastrointestinal: No reported hemesis, hematochezia, vomiting, or acute GI distress Musculoskeletal: Denies any acute onset joint swelling, redness, loss of ROM, or weakness Neurological: No reported episodes of acute onset apraxia, aphasia, dysarthria, agnosia, amnesia, paralysis, loss of coordination, or loss of consciousness  Medication Review  B Complex-C, Difluprednate, Misc Natural Products, Niacinamide,  UNABLE TO FIND, acetaminophen, atenolol, benazepril, cilostazol, cyclobenzaprine, diphenhydrAMINE, gabapentin, hydrOXYzine, lidocaine, montelukast, multivitamin with minerals, omeprazole, thyroid, tiotropium, and traMADol  History Review  Allergy: Ms. Miles-Richert is allergic to tramadol, other, oxycodone, amlodipine, augmentin [amoxicillin-pot clavulanate], azithromycin, contrast media [iodinated diagnostic agents], doxycycline, erythromycin, hydrocodone, lactose intolerance (gi), minocycline, nsaids, relafen [nabumetone], sulfa antibiotics, and tetracyclines & related. Drug: Ms. Miles-Richert  reports no history of drug use. Alcohol:  reports current alcohol use. Tobacco:  reports that she has been smoking cigarettes. She has a 40.00 pack-year smoking history. She has never used smokeless tobacco. Social: Ms. Miles-Richert  reports that she has been smoking cigarettes. She has a 40.00 pack-year smoking history. She has never used smokeless tobacco. She reports current alcohol use. She reports that she does not use drugs. Medical:  has a past medical history of Abdominal distension (03/18/2012), Abnormal weight loss (10/30/2017), Acute kidney injury ([place]) (03/22/2012), Aneurysm ([place]), Anxiety, Blind right eye, Cardiac arrest ([place]), Cataract, Claudication ([place]), Complication of anesthesia (03/15/2012), COPD (chronic obstructive pulmonary disease) ([place]), Delirium (03/18/2012), Dyspnea, GERD (gastroesophageal reflux disease), Heart murmur, Hypertension, Leaky heart valve, Oxygen deficit, Peripheral vascular disease ([place]), Scoliosis, Sepsis ([place]), Shock ([place]) (03/23/2012), SIRS (systemic inflammatory response syndrome) ([place]) (03/23/2012), Spinal stenosis, Stroke ([place]), and Tachycardia. Surgical: Ms. Miles-Richert  has a past surgical history that includes right eye surgery; Wisdom tooth extraction; Back surgery; Abdominal hysterectomy; Breast lumpectomy (Left); Lower Extremity  Angiography (Right, 06/20/2018); Cataract  extraction w/PHACO (Left, 08/14/2019); and Lower Extremity Angiography (Left, 02/03/2020). Family: family history includes Heart disease in her paternal grandfather and paternal grandmother; Heart failure in her father; Hypertension in her mother; Intracerebral hemorrhage in her mother; Kidney disease in her maternal grandmother; Rheum arthritis in her maternal grandmother; Schizophrenia in her daughter; Stroke in her paternal grandfather and paternal grandmother.  Laboratory Chemistry Profile   Renal Lab Results  Component Value Date   BUN 11 11/16/2020   CREATININE 0.83 11/16/2020   BCR 13 11/16/2020   GFRAA 90 09/10/2020   GFRNONAA 78 09/10/2020     Hepatic Lab Results  Component Value Date   AST 15 06/30/2020   ALT 7 06/30/2020   ALBUMIN 4.4 06/30/2020   ALKPHOS 88 06/30/2020     Electrolytes Lab Results  Component Value Date   NA 142 11/16/2020   K 4.1 11/16/2020   CL 102 11/16/2020   CALCIUM 9.4 11/16/2020   MG 1.8 10/13/2017   PHOS 3.2 10/13/2017     Bone No results found for: VD25OH, VD125OH2TOT, LO7564PP2, RJ1884ZY6, 25OHVITD1, 25OHVITD2, 25OHVITD3, TESTOFREE, TESTOSTERONE   Inflammation (CRP: Acute Phase) (ESR: Chronic Phase) Lab Results  Component Value Date   LATICACIDVEN 1.8 10/09/2017       Note: Above Lab results reviewed.  Recent Imaging Review  DG Chest 2 View CLINICAL DATA:  80 year old female with a history of follow-up pneumonia  EXAM: CHEST - 2 VIEW  COMPARISON:  10/22/2020, CT 11/04/2020  FINDINGS: Cardiomediastinal silhouette unchanged in size and contour. No interlobular septal thickening.  Reticular opacities persist at the right upper lung. Coarsened interstitial markings throughout. No new confluent airspace disease. No pleural effusion or pneumothorax.  Degenerative changes of the spine with scoliotic curvature, unchanged from the prior. No displaced fracture.  IMPRESSION: Similar appearance and distribution of  reticulonodular opacities in the lungs, slightly improved at the right lung base, noted on prior CT to be most likely related to chronic infection.  Electronically Signed   By: Corrie Mckusick D.O.   On: 12/07/2020 15:45 Note: Reviewed        Physical Exam  General appearance: Well nourished, well developed, and well hydrated. In no apparent acute distress Mental status: Alert, oriented x 3 (person, place, & time)       Respiratory: No evidence of acute respiratory distress Eyes: PERLA Vitals: BP (!) 144/78   Pulse (!) 122   Temp (!) 97.1 F (36.2 C) (Temporal)   Resp 16   Ht 5' 1" (1.549 m)   Wt 111 lb (50.3 kg)   SpO2 95%   BMI 20.97 kg/m  BMI: Estimated body mass index is 20.97 kg/m as calculated from the following:   Height as of this encounter: 5' 1" (1.549 m).   Weight as of this encounter: 111 lb (50.3 kg). Ideal: Ideal body weight: 47.8 kg (105 lb 6.1 oz) Adjusted ideal body weight: 48.8 kg (107 lb 10 oz)  Low back pain, worse with facet loading Significant scoliosis 5 out of 5 strength bilateral lower extremity: Plantar flexion, dorsiflexion, knee flexion, knee extension.   Assessment   Status Diagnosis  Controlled Controlled Controlled 1. Chronic pain syndrome   2. Idiopathic scoliosis and kyphoscoliosis   3. Thoracic spondylosis without myelopathy   4. Spondylosis without myelopathy or radiculopathy, lumbar region      Updated Problems: Problem  Idiopathic Scoliosis and Kyphoscoliosis    Plan of Care   Ms. Savannah [name] has a current  medication list which includes the following long-term medication(s): atenolol, benazepril, gabapentin, montelukast, omeprazole, thyroid, and spiriva handihaler.  Pharmacotherapy (Medications Ordered): Meds ordered this encounter  Medications  . traMADol (ULTRAM) 50 MG tablet    Sig: Take 1 tablet (50 mg total) by mouth every 6 (six) hours as needed for moderate pain.    Dispense:  120 tablet    Refill:  1     Chronic pain syndrome   Orders:  Orders Placed This Encounter  Procedures  . ToxASSURE Select 13 (MW), Urine    Volume: 30 ml(s). Minimum 3 ml of urine is needed. Document temperature of fresh sample. Indications: Long term (current) use of opiate analgesic (O96.295)    Order Specific Question:   Release to patient    Answer:   Immediate   Follow-up plan:   Return if symptoms worsen or fail to improve.     Status post T11, T12, L1 RFA bilaterally on 03/04/2019, 04/2018       Recent Visits No visits were found meeting these conditions. Showing recent visits within past 90 days and meeting all other requirements Today's Visits Date Type Provider Dept  12/23/20 Office Visit Gillis Santa, MD [facility]  Showing today's visits and meeting all other requirements Future Appointments No visits were found meeting these conditions. Showing future appointments within next 90 days and meeting all other requirements  I discussed the assessment and treatment plan with the patient. The patient was provided an opportunity to ask questions and all were answered. The patient agreed with the plan and demonstrated an understanding of the instructions.  Patient advised to call back or seek an in-person evaluation if the symptoms or condition worsens.  Duration of encounter: 30 minutes.  Note by: Gillis Santa, MD Date: 12/23/2020; Time: 2:48 PM

## 2020-12-23 NOTE — Progress Notes (Signed)
Safety precautions to be maintained throughout the outpatient stay will include: orient to surroundings, keep bed in low position, maintain call bell within reach at all times, provide assistance with transfer out of bed and ambulation.   Nursing Pain Medication Assessment:  Safety precautions to be maintained throughout the outpatient stay will include: orient to surroundings, keep bed in low position, maintain call bell within reach at all times, provide assistance with transfer out of bed and ambulation.  Medication Inspection Compliance: Pill count conducted under aseptic conditions, in front of the patient. Neither the pills nor the bottle was removed from the patient's sight at any time. Once count was completed pills were immediately returned to the patient in their original bottle.  Medication: Tramadol (Ultram) Pill/Patch Count: 16 of 120 Pill/Patch Appearance: Markings consistent with prescribed medication Bottle Appearance: Standard pharmacy container. Clearly labeled. Filled Date: 02 / 01 / 2022 Last Medication intake:  Today

## 2020-12-29 ENCOUNTER — Other Ambulatory Visit: Payer: Self-pay | Admitting: Family Medicine

## 2020-12-29 ENCOUNTER — Other Ambulatory Visit: Payer: TRICARE For Life (TFL)

## 2020-12-29 ENCOUNTER — Other Ambulatory Visit: Payer: Self-pay

## 2020-12-29 ENCOUNTER — Other Ambulatory Visit: Payer: Medicare Other

## 2020-12-29 DIAGNOSIS — E039 Hypothyroidism, unspecified: Secondary | ICD-10-CM

## 2020-12-30 LAB — TSH: TSH: 0.444 u[IU]/mL — ABNORMAL LOW (ref 0.450–4.500)

## 2021-01-01 ENCOUNTER — Other Ambulatory Visit: Payer: Self-pay | Admitting: Family Medicine

## 2021-01-01 ENCOUNTER — Telehealth: Payer: Self-pay | Admitting: Family Medicine

## 2021-01-01 DIAGNOSIS — E039 Hypothyroidism, unspecified: Secondary | ICD-10-CM

## 2021-01-01 MED ORDER — THYROID 81.25 MG PO TABS: 81.2500 mg | ORAL_TABLET | Freq: Every day | ORAL | 0 refills | Status: DC

## 2021-01-01 MED ORDER — THYROID 81.25 MG PO TABS: 81.2500 mg | ORAL_TABLET | Freq: Every day | ORAL | 1 refills | Status: DC

## 2021-01-01 NOTE — Telephone Encounter (Signed)
Requested medication (s) are due for refill today: yes  Requested medication (s) are on the active medication list: yes  Last refill:  01/01/21 #30 1 refill  Future visit scheduled: no  Notes to clinic:  Pharmacy comment: THIS PRODUCT NOT AVAILABLE FROM OUR [facility] -- PLEASE CONSIDER ALTERNATIVE   THANKS     Requested Prescriptions  Pending Prescriptions Disp Refills   NATURE-THROID 81.25 MG TABS [Pharmacy Med Name: thyroid 81.25 MG Tab] 30 tablet 1    Sig: Take 81.25 mg by mouth daily before breakfast.      Endocrinology:  Hypothyroid Agents Failed - 01/01/2021  2:49 PM      Failed - TSH needs to be rechecked within 3 months after an abnormal result. Refill until TSH is due.      Failed - TSH in normal range and within 360 days    TSH  Date Value Ref Range Status  12/29/2020 0.444 (L) 0.450 - 4.500 uIU/mL Final          Passed - Valid encounter within last 12 months    Recent Outpatient Visits           1 month ago Pneumonia of both lower lobes due to infectious organism   [place], [place], DO   2 months ago Acquired hypothyroidism   [facility] [place], [place], DO   3 months ago Acquired hypothyroidism   [facility] [place], Megan P, DO   3 months ago Seasonal allergic rhinitis due to pollen   [facility], Megan P, DO   4 months ago Chronic maxillary sinusitis   [facility] [place], [place], DO

## 2021-01-01 NOTE — Telephone Encounter (Signed)
Please check with patient- will have to either alternate medicine or try to get this pill from another pharmacy

## 2021-01-01 NOTE — Telephone Encounter (Signed)
Routing to provider  

## 2021-01-01 NOTE — Telephone Encounter (Signed)
[facility] you [facility]

## 2021-01-12 NOTE — Telephone Encounter (Signed)
Tried to call pharmacy twice, unable to get through, will try again.

## 2021-01-12 NOTE — Telephone Encounter (Signed)
Patient called in to inform Dr Wynetta Emery that [facility] need a call from her because they need additional information. Please call [facility] at    Ph# [phone]

## 2021-01-15 ENCOUNTER — Telehealth: Payer: Self-pay

## 2021-01-15 MED ORDER — THYROID 60 MG PO TABS: 60.0000 mg | ORAL_TABLET | ORAL | 1 refills | Status: DC

## 2021-01-15 MED ORDER — THYROID 90 MG PO TABS: 90.0000 mg | ORAL_TABLET | ORAL | 1 refills | Status: DC

## 2021-01-15 NOTE — Telephone Encounter (Signed)
Patient is aware of date/time of covid test prior to PFT.  

## 2021-01-15 NOTE — Addendum Note (Signed)
Addended by: [name] on: 01/15/2021 12:13 PM   Modules accepted: Orders

## 2021-01-15 NOTE — Telephone Encounter (Signed)
Can this medication be changed, they do not carry this either

## 2021-01-15 NOTE — Telephone Encounter (Addendum)
Please let her know that unfortunately because of her dose of the medicine, she's going to have to get 2 rxs and alternate between the 2 of them. It'll be 90 1 day and 60 the next. We'll recheck her in 6 weeks. Thanks.

## 2021-01-15 NOTE — Telephone Encounter (Signed)
Patient notified.  Lab appt scheduled.

## 2021-01-20 ENCOUNTER — Other Ambulatory Visit
Admission: RE | Admit: 2021-01-20 | Discharge: 2021-01-20 | Disposition: A | Discharge status: Home / Self Care | Payer: Medicare Other | Source: Ambulatory Visit | Attending: Pulmonary Disease | Admitting: Pulmonary Disease

## 2021-01-20 ENCOUNTER — Other Ambulatory Visit: Payer: Self-pay

## 2021-01-20 DIAGNOSIS — Z01812 Encounter for preprocedural laboratory examination: Secondary | ICD-10-CM | POA: Diagnosis not present

## 2021-01-20 DIAGNOSIS — Z20822 Contact with and (suspected) exposure to covid-19: Secondary | ICD-10-CM | POA: Insufficient documentation

## 2021-01-20 LAB — SARS CORONAVIRUS 2 (TAT 6-24 HRS): SARS Coronavirus 2: NEGATIVE

## 2021-01-21 ENCOUNTER — Ambulatory Visit: Payer: Medicare Other | Attending: Pulmonary Disease

## 2021-01-21 DIAGNOSIS — F17210 Nicotine dependence, cigarettes, uncomplicated: Secondary | ICD-10-CM | POA: Insufficient documentation

## 2021-01-21 DIAGNOSIS — J449 Chronic obstructive pulmonary disease, unspecified: Secondary | ICD-10-CM | POA: Diagnosis not present

## 2021-01-21 MED ORDER — ALBUTEROL SULFATE (2.5 MG/3ML) 0.083% IN NEBU
2.5000 mg | INHALATION_SOLUTION | Freq: Once | RESPIRATORY_TRACT | Status: AC
  Administered 2021-01-21: 2.5 mg via RESPIRATORY_TRACT
  Filled 2021-01-21: qty 3

## 2021-01-29 ENCOUNTER — Telehealth: Payer: Self-pay | Admitting: Pulmonary Disease

## 2021-01-29 NOTE — Telephone Encounter (Signed)
Trelegy 100/62.5/25 1 puff daily

## 2021-01-29 NOTE — Telephone Encounter (Signed)
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  I would recommend changing inhaler to either Breztri or Trelegy whichever her insurance covers.  I do not think thatshe is getting adequate coverage with just the Spiriva alone.      Patient is aware of results and voiced her understanding.  Patient would like to try Trelegy.   Dr. Patsey Berthold, which dosage of trelegy?

## 2021-01-29 NOTE — Telephone Encounter (Signed)
Lm for patient to see if she would like a sample prior to sending mail order.

## 2021-02-01 MED ORDER — TRELEGY ELLIPTA 100-62.5-25 MCG/INH IN AEPB: 1.0000 | INHALATION_SPRAY | Freq: Every day | RESPIRATORY_TRACT | 0 refills | Status: AC

## 2021-02-01 NOTE — Telephone Encounter (Signed)
Lm x2 for patient.  Rx for trelegy has been sent to [facility].  Will close encounter per office protocol.

## 2021-02-16 ENCOUNTER — Other Ambulatory Visit: Payer: TRICARE For Life (TFL)

## 2021-02-16 ENCOUNTER — Encounter: Payer: Self-pay | Admitting: Family Medicine

## 2021-02-16 ENCOUNTER — Ambulatory Visit (INDEPENDENT_AMBULATORY_CARE_PROVIDER_SITE_OTHER): Payer: Medicare Other | Admitting: Family Medicine

## 2021-02-16 DIAGNOSIS — J029 Acute pharyngitis, unspecified: Secondary | ICD-10-CM

## 2021-02-16 NOTE — Progress Notes (Signed)
There were no vitals taken for this visit.   Subjective:    Patient ID: Savannah Mcclure, female    DOB: 1941-01-06, 80 y.o.   MRN: [id number]  HPI: Savannah Mcclure is a 80 y.o. female  Chief Complaint  Patient presents with   Sore Throat    Patient states has had a sore throat since yesterday and had chills. Patient states her daughter has recently been sick and she has been in close contact with her.    UPPER RESPIRATORY TRACT INFECTION- her daughter is sick with flu pneumonia in the [facility] ICU Duration: 1 day Worst symptom: sore throat and swollen glands Fever: yes Cough: yes Shortness of breath: yes Wheezing: no Chest pain: no Chest tightness: no Chest congestion: yes Nasal congestion: yes Runny nose: yes Post nasal drip: yes Sneezing: no Sore throat: yes Swollen glands: yes Sinus pressure: yes Headache: no Face pain: no Toothache: no Ear pain: no  Ear pressure: yes  Eyes red/itching:no Eye drainage/crusting: no  Vomiting: no Rash: no Fatigue: yes Sick contacts: yes Strep contacts: no  Context: worse Recurrent sinusitis: no Relief with OTC cold/cough medications: no  Treatments attempted: collodial silver   Relevant past medical, surgical, family and social history reviewed and updated as indicated. Interim medical history since our last visit reviewed. Allergies and medications reviewed and updated.  Review of Systems  Constitutional:  Positive for chills, diaphoresis, fatigue and fever. Negative for activity change, appetite change and unexpected weight change.  HENT:  Positive for congestion, postnasal drip, rhinorrhea, sinus pressure and sore throat. Negative for dental problem, drooling, ear discharge, ear pain, facial swelling, hearing loss, mouth sores, nosebleeds, sinus pain, sneezing, tinnitus, trouble swallowing and voice change.   Respiratory:  Positive for cough. Negative for apnea, choking, chest tightness, shortness of  breath, wheezing and stridor.   Cardiovascular: Negative.   Gastrointestinal: Negative.   Skin: Negative.   Psychiatric/Behavioral: Negative.     Per HPI unless specifically indicated above     Objective:    There were no vitals taken for this visit.  Wt Readings from Last 3 Encounters:  12/23/20 111 lb (50.3 kg)  12/22/20 111 lb (50.3 kg)  11/16/20 113 lb 9.6 oz (51.5 kg)    Physical Exam Vitals and nursing note reviewed.  Pulmonary:     Effort: Pulmonary effort is normal. No respiratory distress.     Comments: Speaking in full sentences Neurological:     Mental Status: She is alert.  Psychiatric:        Mood and Affect: Mood normal.        Behavior: Behavior normal.        Thought Content: Thought content normal.        Judgment: Judgment normal.    Results for orders placed or performed during the hospital encounter of 01/20/21  SARS CORONAVIRUS 2 (TAT 6-24 HRS) Nasopharyngeal Nasopharyngeal Swab   Specimen: Nasopharyngeal Swab  Result Value Ref Range   SARS Coronavirus 2 NEGATIVE NEGATIVE      Assessment & Plan:   Problem List Items Addressed This Visit   None Visit Diagnoses     Sore throat    -  Primary   Given flu exposure, concern for flu- will check flu, strep and covid. Await results. Treat as needed.    Relevant Orders   Veritor Flu A/B Waived   Rapid Strep Screen ([facility] ONLY)   Novel Coronavirus, NAA ([facility])  Follow up plan: Return if symptoms worsen or fail to improve.   This visit was completed via telephone due to the restrictions of the COVID-19 pandemic. All issues as above were discussed and addressed but no physical exam was performed. If it was felt that the patient should be evaluated in the office, they were directed there. The patient verbally consented to this visit. Patient was unable to complete an audio/visual visit due to Lack of equipment. Due to the catastrophic nature of the COVID-19 pandemic, this visit was  done through audio contact only. Location of the patient: home Location of the provider: work Those involved with this call:  Provider: Park Liter, DO CMA: Louanna Raw, [place] Desk/Registration: Jill Side  Time spent on call:  21 minutes on the phone discussing health concerns. 30 minutes total spent in review of patient's record and preparation of their chart.

## 2021-02-17 LAB — VERITOR FLU A/B WAIVED
Influenza A: NEGATIVE
Influenza B: NEGATIVE

## 2021-02-17 LAB — NOVEL CORONAVIRUS, NAA: SARS-CoV-2, NAA: NOT DETECTED

## 2021-02-17 LAB — SARS-COV-2, NAA 2 DAY TAT

## 2021-02-18 MED ORDER — PREDNISONE 50 MG PO TABS: 50.0000 mg | ORAL_TABLET | Freq: Every day | ORAL | 0 refills | Status: DC

## 2021-02-18 NOTE — Addendum Note (Signed)
Addended by: [name] on: 02/18/2021 11:50 AM   Modules accepted: Orders

## 2021-02-22 DIAGNOSIS — E440 Moderate protein-calorie malnutrition: Secondary | ICD-10-CM | POA: Diagnosis not present

## 2021-02-22 DIAGNOSIS — R0600 Dyspnea, unspecified: Secondary | ICD-10-CM | POA: Diagnosis not present

## 2021-02-22 DIAGNOSIS — R509 Fever, unspecified: Secondary | ICD-10-CM | POA: Diagnosis not present

## 2021-02-22 DIAGNOSIS — J470 Bronchiectasis with acute lower respiratory infection: Secondary | ICD-10-CM | POA: Diagnosis not present

## 2021-02-22 DIAGNOSIS — J471 Bronchiectasis with (acute) exacerbation: Secondary | ICD-10-CM | POA: Diagnosis not present

## 2021-02-22 DIAGNOSIS — R059 Cough, unspecified: Secondary | ICD-10-CM | POA: Diagnosis not present

## 2021-02-22 DIAGNOSIS — Z6820 Body mass index (BMI) 20.0-20.9, adult: Secondary | ICD-10-CM | POA: Diagnosis not present

## 2021-02-22 DIAGNOSIS — A419 Sepsis, unspecified organism: Secondary | ICD-10-CM | POA: Diagnosis not present

## 2021-02-22 DIAGNOSIS — J9601 Acute respiratory failure with hypoxia: Secondary | ICD-10-CM | POA: Diagnosis not present

## 2021-02-22 DIAGNOSIS — I10 Essential (primary) hypertension: Secondary | ICD-10-CM | POA: Diagnosis not present

## 2021-02-22 DIAGNOSIS — R000 Tachycardia, unspecified: Secondary | ICD-10-CM | POA: Diagnosis not present

## 2021-02-22 DIAGNOSIS — Z20822 Contact with and (suspected) exposure to covid-19: Secondary | ICD-10-CM | POA: Diagnosis not present

## 2021-02-22 DIAGNOSIS — I4510 Unspecified right bundle-branch block: Secondary | ICD-10-CM | POA: Diagnosis not present

## 2021-02-22 DIAGNOSIS — E876 Hypokalemia: Secondary | ICD-10-CM | POA: Diagnosis not present

## 2021-02-22 DIAGNOSIS — J189 Pneumonia, unspecified organism: Secondary | ICD-10-CM | POA: Diagnosis not present

## 2021-02-22 DIAGNOSIS — R112 Nausea with vomiting, unspecified: Secondary | ICD-10-CM | POA: Diagnosis not present

## 2021-02-22 DIAGNOSIS — R918 Other nonspecific abnormal finding of lung field: Secondary | ICD-10-CM | POA: Diagnosis not present

## 2021-02-22 LAB — CULTURE, GROUP A STREP: Strep A Culture: NEGATIVE

## 2021-02-22 LAB — RAPID STREP SCREEN (MED CTR MEBANE ONLY): Strep Gp A Ag, IA W/Reflex: NEGATIVE

## 2021-02-23 ENCOUNTER — Encounter (INDEPENDENT_AMBULATORY_CARE_PROVIDER_SITE_OTHER): Payer: Medicare Other

## 2021-02-23 ENCOUNTER — Ambulatory Visit (INDEPENDENT_AMBULATORY_CARE_PROVIDER_SITE_OTHER): Payer: Medicare Other | Admitting: Vascular Surgery

## 2021-02-23 DIAGNOSIS — J189 Pneumonia, unspecified organism: Secondary | ICD-10-CM | POA: Diagnosis not present

## 2021-02-23 DIAGNOSIS — Z6820 Body mass index (BMI) 20.0-20.9, adult: Secondary | ICD-10-CM | POA: Diagnosis not present

## 2021-02-23 DIAGNOSIS — F17210 Nicotine dependence, cigarettes, uncomplicated: Secondary | ICD-10-CM | POA: Diagnosis not present

## 2021-02-23 DIAGNOSIS — I10 Essential (primary) hypertension: Secondary | ICD-10-CM | POA: Diagnosis not present

## 2021-02-24 DIAGNOSIS — E876 Hypokalemia: Secondary | ICD-10-CM | POA: Diagnosis not present

## 2021-02-24 DIAGNOSIS — R7401 Elevation of levels of liver transaminase levels: Secondary | ICD-10-CM | POA: Diagnosis not present

## 2021-02-24 DIAGNOSIS — J471 Bronchiectasis with (acute) exacerbation: Secondary | ICD-10-CM | POA: Diagnosis present

## 2021-02-24 DIAGNOSIS — R000 Tachycardia, unspecified: Secondary | ICD-10-CM | POA: Diagnosis present

## 2021-02-24 DIAGNOSIS — R590 Localized enlarged lymph nodes: Secondary | ICD-10-CM | POA: Diagnosis not present

## 2021-02-24 DIAGNOSIS — A419 Sepsis, unspecified organism: Secondary | ICD-10-CM | POA: Diagnosis present

## 2021-02-24 DIAGNOSIS — Z20822 Contact with and (suspected) exposure to covid-19: Secondary | ICD-10-CM | POA: Diagnosis present

## 2021-02-24 DIAGNOSIS — J189 Pneumonia, unspecified organism: Secondary | ICD-10-CM | POA: Diagnosis present

## 2021-02-24 DIAGNOSIS — I739 Peripheral vascular disease, unspecified: Secondary | ICD-10-CM | POA: Diagnosis present

## 2021-02-24 DIAGNOSIS — K219 Gastro-esophageal reflux disease without esophagitis: Secondary | ICD-10-CM | POA: Diagnosis present

## 2021-02-24 DIAGNOSIS — R6520 Severe sepsis without septic shock: Secondary | ICD-10-CM | POA: Diagnosis present

## 2021-02-24 DIAGNOSIS — J470 Bronchiectasis with acute lower respiratory infection: Secondary | ICD-10-CM | POA: Diagnosis present

## 2021-02-24 DIAGNOSIS — Z882 Allergy status to sulfonamides status: Secondary | ICD-10-CM | POA: Diagnosis not present

## 2021-02-24 DIAGNOSIS — F419 Anxiety disorder, unspecified: Secondary | ICD-10-CM | POA: Diagnosis present

## 2021-02-24 DIAGNOSIS — R9431 Abnormal electrocardiogram [ECG] [EKG]: Secondary | ICD-10-CM | POA: Diagnosis not present

## 2021-02-24 DIAGNOSIS — I4510 Unspecified right bundle-branch block: Secondary | ICD-10-CM | POA: Diagnosis present

## 2021-02-24 DIAGNOSIS — E440 Moderate protein-calorie malnutrition: Secondary | ICD-10-CM | POA: Diagnosis present

## 2021-02-24 DIAGNOSIS — Z9981 Dependence on supplemental oxygen: Secondary | ICD-10-CM | POA: Diagnosis not present

## 2021-02-24 DIAGNOSIS — J9601 Acute respiratory failure with hypoxia: Secondary | ICD-10-CM | POA: Diagnosis not present

## 2021-02-24 DIAGNOSIS — I10 Essential (primary) hypertension: Secondary | ICD-10-CM | POA: Diagnosis present

## 2021-02-24 DIAGNOSIS — J479 Bronchiectasis, uncomplicated: Secondary | ICD-10-CM | POA: Diagnosis not present

## 2021-02-24 DIAGNOSIS — R197 Diarrhea, unspecified: Secondary | ICD-10-CM | POA: Diagnosis not present

## 2021-02-24 DIAGNOSIS — R911 Solitary pulmonary nodule: Secondary | ICD-10-CM | POA: Diagnosis not present

## 2021-02-24 DIAGNOSIS — G629 Polyneuropathy, unspecified: Secondary | ICD-10-CM | POA: Diagnosis present

## 2021-02-24 DIAGNOSIS — F17210 Nicotine dependence, cigarettes, uncomplicated: Secondary | ICD-10-CM | POA: Diagnosis present

## 2021-02-24 DIAGNOSIS — Z6820 Body mass index (BMI) 20.0-20.9, adult: Secondary | ICD-10-CM | POA: Diagnosis not present

## 2021-02-24 DIAGNOSIS — G8929 Other chronic pain: Secondary | ICD-10-CM | POA: Diagnosis present

## 2021-02-24 DIAGNOSIS — E039 Hypothyroidism, unspecified: Secondary | ICD-10-CM | POA: Diagnosis present

## 2021-02-24 DIAGNOSIS — J90 Pleural effusion, not elsewhere classified: Secondary | ICD-10-CM | POA: Diagnosis not present

## 2021-02-24 DIAGNOSIS — Z79899 Other long term (current) drug therapy: Secondary | ICD-10-CM | POA: Diagnosis not present

## 2021-02-25 ENCOUNTER — Telehealth: Payer: Self-pay

## 2021-02-25 NOTE — Telephone Encounter (Signed)
Copied from [place] [id number]. Topic: General - Other >> Feb 25, 2021  2:28 PM Lennox Solders wrote: Reason for CRM: FYI pt is calling to let dr Wynetta Emery know she is in [facility] with double PNA

## 2021-02-25 NOTE — Telephone Encounter (Signed)
FYI

## 2021-02-26 DIAGNOSIS — J189 Pneumonia, unspecified organism: Secondary | ICD-10-CM | POA: Diagnosis not present

## 2021-03-02 ENCOUNTER — Other Ambulatory Visit: Payer: TRICARE For Life (TFL)

## 2021-03-02 ENCOUNTER — Telehealth: Payer: Self-pay

## 2021-03-02 NOTE — Telephone Encounter (Signed)
Copied from [place] [id number]. Topic: Appointment Scheduling - Scheduling Inquiry for Clinic >> Mar 01, 2021  5:01 PM Tessa Lerner A wrote: Reason for CRM: Jenny Reichmann with [facility] has called requesting for the practice to contact the patient's daughter (E. Valines) to schedule a hospital follow up visit   The patient is estimated to be discharged from [facility] tomorrow 03/02/21  Please contact patient's daughter when possible   Lvm to make this apt

## 2021-03-09 DIAGNOSIS — J440 Chronic obstructive pulmonary disease with acute lower respiratory infection: Secondary | ICD-10-CM | POA: Diagnosis not present

## 2021-03-09 DIAGNOSIS — Z8616 Personal history of COVID-19: Secondary | ICD-10-CM | POA: Diagnosis not present

## 2021-03-09 DIAGNOSIS — I10 Essential (primary) hypertension: Secondary | ICD-10-CM | POA: Diagnosis not present

## 2021-03-09 DIAGNOSIS — E440 Moderate protein-calorie malnutrition: Secondary | ICD-10-CM | POA: Diagnosis not present

## 2021-03-09 DIAGNOSIS — J471 Bronchiectasis with (acute) exacerbation: Secondary | ICD-10-CM | POA: Diagnosis not present

## 2021-03-09 DIAGNOSIS — G894 Chronic pain syndrome: Secondary | ICD-10-CM | POA: Diagnosis not present

## 2021-03-09 DIAGNOSIS — J189 Pneumonia, unspecified organism: Secondary | ICD-10-CM | POA: Diagnosis not present

## 2021-03-09 DIAGNOSIS — E876 Hypokalemia: Secondary | ICD-10-CM | POA: Diagnosis not present

## 2021-03-09 DIAGNOSIS — F17210 Nicotine dependence, cigarettes, uncomplicated: Secondary | ICD-10-CM | POA: Diagnosis not present

## 2021-03-09 DIAGNOSIS — E039 Hypothyroidism, unspecified: Secondary | ICD-10-CM | POA: Diagnosis not present

## 2021-03-15 ENCOUNTER — Other Ambulatory Visit: Payer: Self-pay

## 2021-03-15 ENCOUNTER — Ambulatory Visit (INDEPENDENT_AMBULATORY_CARE_PROVIDER_SITE_OTHER): Payer: Medicare Other | Admitting: Family Medicine

## 2021-03-15 ENCOUNTER — Telehealth: Payer: Self-pay

## 2021-03-15 ENCOUNTER — Encounter: Payer: Self-pay | Admitting: Family Medicine

## 2021-03-15 VITALS — BP 121/62 | HR 93 | Temp 98.6°F | Ht 61.0 in | Wt 107.6 lb

## 2021-03-15 DIAGNOSIS — R748 Abnormal levels of other serum enzymes: Secondary | ICD-10-CM | POA: Diagnosis not present

## 2021-03-15 DIAGNOSIS — I6523 Occlusion and stenosis of bilateral carotid arteries: Secondary | ICD-10-CM | POA: Diagnosis not present

## 2021-03-15 DIAGNOSIS — J479 Bronchiectasis, uncomplicated: Secondary | ICD-10-CM

## 2021-03-15 MED ORDER — THYROID 90 MG PO TABS: 90.0000 mg | ORAL_TABLET | ORAL | 1 refills | Status: DC

## 2021-03-15 MED ORDER — TRELEGY ELLIPTA 100-62.5-25 MCG/INH IN AEPB: 1.0000 | INHALATION_SPRAY | Freq: Every day | RESPIRATORY_TRACT | 3 refills | Status: DC

## 2021-03-15 MED ORDER — THYROID 60 MG PO TABS: 60.0000 mg | ORAL_TABLET | ORAL | 1 refills | Status: DC

## 2021-03-15 NOTE — Telephone Encounter (Signed)
Wants to try Toradol because her daughter took it while in the hospital and it helped. I told her you do not typically prescribe Toradol, but I would ask you. Last appt 12/2020, no future appts scheduled.

## 2021-03-15 NOTE — Telephone Encounter (Signed)
Patient notified per voicemail. 

## 2021-03-15 NOTE — Telephone Encounter (Signed)
Wants to know if she can switch from tramadol to Toradol medicine.

## 2021-03-15 NOTE — Progress Notes (Signed)
BP 121/62   Pulse 93   Temp 98.6 F (37 C) (Oral)   Ht '5\' 1"'  (1.549 m)   Wt 107 lb 9.6 oz (48.8 kg)   SpO2 94%   BMI 20.33 kg/m    Subjective:    Patient ID: Savannah Mcclure, female    DOB: 08/26/1940, 80 y.o.   MRN: [id number]  HPI: Savannah Mcclure is a 80 y.o. female  Chief Complaint  Patient presents with   Hospitalization Follow-up    Admitted to [facility] for pneumonia    Transition of [facility] Follow up.   Hospital/Facility: [facility] D/C Physician: Dr Savannah Mcclure D/C Date: 03/01/21  Records Requested: 03/15/21 Records Received: 03/15/21 Records Reviewed: 03/15/21  Diagnoses on Discharge: Hypokalemia, Pneumonia of R upper lobe  Date of interactive Contact within 48 hours of discharge: NOT DONE  Date of 7 day or 14 day face-to-face visit: 03/15/21   within 14 days  Outpatient Encounter Medications as of 03/15/2021  Medication Sig Note   acetaminophen (TYLENOL) 500 MG tablet Take 500 mg by mouth every 6 (six) hours as needed.    albuterol (PROVENTIL) (2.5 MG/3ML) 0.083% nebulizer solution Take by nebulization 3 (three) times daily.    atenolol (TENORMIN) 25 MG tablet Take 1 tablet (25 mg total) by mouth daily.    B-COMPLEX-C PO Take 1 tablet by mouth daily.    benazepril (LOTENSIN) 10 MG tablet Take 1 tablet (10 mg total) by mouth daily.    cilostazol (PLETAL) 100 MG tablet Take 1 tablet (100 mg total) by mouth 2 (two) times daily.    cyclobenzaprine (FLEXERIL) 5 MG tablet Take 5 mg by mouth 3 (three) times daily as needed for muscle spasms.     diphenhydrAMINE (BENADRYL) 25 MG tablet Take 25 mg by mouth every 6 (six) hours as needed.     DUREZOL 0.05 % EMUL Place into the right eye every morning.     fluticasone (FLONASE) 50 MCG/ACT nasal spray 2 sprays into each nostril in the morning. Up to three sprays.    gabapentin (NEURONTIN) 300 MG capsule Take 1 capsule (300 mg total) by mouth 2 (two) times daily.    hydrOXYzine (ATARAX/VISTARIL) 25 MG tablet  Take 1 tablet (25 mg total) by mouth 3 (three) times daily as needed.    lidocaine (LIDODERM) 5 % Place 1 patch onto the skin daily. Remove & Discard patch within 12 hours or as directed by MD    Misc Natural Products ([place]) Take by mouth daily.    montelukast (SINGULAIR) 10 MG tablet Take 1 tablet (10 mg total) by mouth at bedtime.    Multiple Vitamins-Minerals (MULTIVITAMIN WITH MINERALS) tablet Take 1 tablet by mouth daily.    NIACINAMIDE PO Take 1 capsule by mouth daily.    omeprazole (PRILOSEC) 20 MG capsule Take 1 capsule (20 mg total) by mouth daily.    UNABLE TO FIND Take 1 tablet by mouth 2 (two) times daily. Cardio FX    UNABLE TO FIND Take 1 tablet by mouth daily. Cinnergy  09/06/2018: PRN    [DISCONTINUED] thyroid (ARMOUR THYROID) 60 MG tablet Take 1 tablet (60 mg total) by mouth every other day. To be alternated with the 5m tab    [DISCONTINUED] thyroid (ARMOUR THYROID) 90 MG tablet Take 1 tablet (90 mg total) by mouth every other day. To be alternated with the 642mtab    Fluticasone-Umeclidin-Vilant (TRELEGY ELLIPTA) 100-62.5-25 MCG/INH AEPB Inhale 1 puff into the lungs daily.  thyroid (ARMOUR THYROID) 60 MG tablet Take 1 tablet (60 mg total) by mouth every other day. To be alternated with the 60m tab    thyroid (ARMOUR THYROID) 90 MG tablet Take 1 tablet (90 mg total) by mouth every other day. To be alternated with the 658mtab    [DISCONTINUED] Fluticasone-Umeclidin-Vilant (TRELEGY ELLIPTA) 100-62.5-25 MCG/INH AEPB Inhale into the lungs.    [DISCONTINUED] predniSONE (DELTASONE) 50 MG tablet Take 1 tablet (50 mg total) by mouth daily with breakfast.    No facility-administered encounter medications on file as of 03/15/2021.  Per Hospitalist: "[name] is a [age].o. female with [facility] new diagnosis of bronchiectasis now established..   1. Initial clinical presentation consistent with community-acquired pneumonia with acute  hypoxemic respiratory failure, CT scan shows much more complex lung disease consistent with multifocal bronchiectasis. [facility] of [facility] lung disease of this significance. -Appreciate Pulmonology consultation, continued recommendations for therapy.  Vancomycin discontinued, start Augmentin, plan to discharge in the morning on home oxygen. -No positive results on sputum or blood cultures at this time -Continue nasal cannula oxygen to keep O2 sat above 93%, she continues to improve now down to 2 L nasal cannula with 96% saturation. -She completed 3 days of atypical coverage with azithromycin, 5 days of vancomycin and cefepime complaint.  2. Hypokalemia: Resolved, she will likely need a low-dose potassium supplement on discharge. Unclear what she has chronic hypokalemia.  3. Hypomagnesemia and prolonged QTc:  -Repleted IV, level 1.6 today. Increase daily supplement to 400 mg twice daily. -Repeat EKG in a.m., no complaint of palpitations, patient hemodynamically stable..  4. Anxiety: -Follow-up as outpatient to continue her Celexa or other SSRI or similar medication to help. No acute anxiety issues.  5. Mild elevation in liver transaminases of no clinical significance at this time.   Chronic Medical Conditions COPD: continue home trelegy ellipta, singulair HTN: continue atenolol 2573maily, benazepril 75m63mily Hypothyroidism: PCP has her taking 60mg38m 90mg 88mrnating every day. Continue 60mg d30m here. Chronic pain: continue home tramadol 50mg q625mn + benadryl Added Lidocaine Patch for back pain Tobacco use disorder: smoking cessation consult"  Diagnostic Tests Reviewed:  Patchy right upper lung airspace opacity, concerning for pneumonia. Follow-up radiography in 6 weeks is recommended to ensure resolution and excludeunderlying neoplasm.  Narrative  EXAM: XR CHEST 2 VIEWS  DATE:  02/22/2021 5:47 PM  ACCESSION: [id number]: 02/22/2021 5:52 PM  [place] IND[place]: 79 years36[place] Female with DYSPNEA ; Dyspnea     COMPARISON: None   TECHNIQUE: PA and Lateral Chest Radiographs.   FINDINGS:   The lungs appear somewhat hyperinflated. There is patchy left upper lung airspace opacity. The lungs are otherwise clear. No pleural effusion or pneumothorax is identified. The cardiomediastinal silhouette is normal in size. There are calcifications of the aorta. The bones appear demineralized. Thereare degenerative changes of the spine.  Procedure Note  Pietryga, Jason AlMelba Coon7/06/2021  Formatting of this note might be different from the original.  EXAM: XR CHEST 2 VIEWS  DATE: 02/22/2021 5:47 PM  ACCESSION: [id number]: 02/22/2021 5:52 PM  [place] IND[place]: 79 years56[place] Female with DYSPNEA ; Dyspnea     COMPARISON: None   TECHNIQUE: PA and Lateral Chest Radiographs.   FINDINGS:   The lungs appear  somewhat hyperinflated. There is patchy left upper lung airspace opacity. The lungs are otherwise clear. No pleural effusion or pneumothorax is identified. The cardiomediastinal silhouette is normal in size. There are calcifications of the aorta. The bones appear demineralized. There are degenerative changes of the spine.   IMPRESSION:   Patchy right upper lung airspace opacity, concerning for pneumonia. Follow-up radiography in 6 weeks is recommended to ensure resolution and excludeunderlying neoplasm.  Chest -- Computed Tomography    Impression  1.Multifocal bronchiectasis, with superimposed groundglass opacification and smooth interlobular septal thickening seen throughout both lungs. These findings are most pronounced within the left upper lobe Additional nodular and consolidative opacities with superimposed tree-in-bud nodularity seen throughout the dependent  aspects of both lower lobes. While nonspecific, these findings could potentially be seen in the setting of developing atypical infectious processes and/or some component of multifocal aspiration. Repeat chest CT is recommended in 3-6 weeks to ensure resolution/clearing.  2.Small bilateral pleural effusions with associated passive atelectasis of adjacent lung parenchyma (left greater than right).  3.53m rounded perifissural nodule within the right upper lobe. Of note, an outside study dated 2019 reported a similar nodule measuring the same size per review of care everywhere. Continued attention is recommended on follow up.  4.Other chronic or incidental findings, as noted above.   --Multiple prominent mediastinal and bilateral axillary lymph nodes as described above. Findings may be reactive in nature, however cannot exclude diffuse process. Recommend clinical correlation.   --Additional chronic and incidental findings, as above.  Narrative  EXAM: CT CHEST WO CONTRAST  DATE: 02/25/2021 6:14 PM  ACCESSION: [id number]  DICTATED: 02/25/2021 7:15 PM  [place] LOCATION: [place]  CLINICAL INDICATION: 80years old Female with Pneumonia  -  eval bronchiectasis; Pneumonia     COMPARISON: Chest radiograph 02/22/2021   TECHNIQUE: A helical CT scan was obtained without IV contrast from the thoracic inlet through the hemidiaphragms. Images were reconstructed in the axial plane.  Coronal and sagittal reformatted images of the chest were also provided for further evaluation of the lung parenchyma.   FINDINGS:   AIRWAYS, LUNGS, PLEURA:  Clear central airways. Biapical pleural-parenchymal scarring. Emphysema. Multifocal areas of bronchiectasis with peripherally impacted airways are seen throughout both lungs, most pronounced throughout the bilateral upper lobes (left greater than right). Multifocal areas of nodular groundglass opacification with smooth interlobular septal thickening seen  throughout both upper lobes, along the medial aspect of the right middle lobe, and left lower lobe. Additional scattered foci of tree-in-bud nodularity throughout the dependent aspects of both lower lobes and   8 mm rounded nodular opacity in the perifissural right upper lobe (3:37). Small bilateral pleural effusions with associated passive atelectasis of adjacent lung parenchyma (greater on the left).   MEDIASTINUM: Normal heart size. Dense mitral valve and aortic valve calcifications. Three-vessel calcified atherosclerotic disease of the coronary arteries. No pericardial effusion. Calcified atherosclerotic disease of the aortic arch and descending thoracic aorta. Normal caliber thoracic aorta. The main pulmonary artery is normal in caliber. Multiple prominent mediastinal lymph nodes. Precarinal lymph node measuring 1.0 cm. (3:39). Small sliding-type hiatal hernia.   IMAGED ABDOMEN: Calcified granuloma at the medial hepatic dome. The upper abdomen is otherwise unremarkable.   SOFT TISSUES: Mildly prominent bilateral axillary lymph nodes measuring up to 1.1 cm in short axis bilaterally. Benign-appearing calcifications are seen throughout the left breast. Soft tissues are otherwise unremarkable.   BONES: S-shaped scoliosis of the thoracolumbar spine, with multilevel degenerative changes seen throughout multilevel anterior bridging osteophytes,  which could potentially be seen in the setting of DISH. No concerning osseouslesions.  Procedure Note  Gar Gibbon, MD - 02/25/2021  Formatting of this note might be different from the original.  EXAM: CT CHEST WO CONTRAST  DATE: 02/25/2021 6:14 PM  ACCESSION: [id number]  DICTATED: 02/25/2021 7:15 PM  INTERPRETATION LOCATION: [place]   CLINICAL INDICATION: 80 years old Female with Pneumonia  -  eval bronchiectasis; Pneumonia     COMPARISON: Chest radiograph 02/22/2021   TECHNIQUE: A helical CT scan was obtained without IV contrast from the  thoracic inlet through the hemidiaphragms. Images were reconstructed in the axial plane.  Coronal and sagittal reformatted images of the chest were also provided for further evaluation of the lung parenchyma.   FINDINGS:   AIRWAYS, LUNGS, PLEURA:  Clear central airways. Biapical pleural-parenchymal scarring. Emphysema. Multifocal areas of bronchiectasis with peripherally impacted airways are seen throughout both lungs, most pronounced throughout the bilateral upper lobes (left greater than right). Multifocal areas of nodular groundglass opacification with smooth interlobular septal thickening seen throughout both upper lobes, along the medial aspect of the right middle lobe, and left lower lobe. Additional scattered foci of tree-in-bud nodularity throughout the dependent aspects of both lower lobes and   8 mm rounded nodular opacity in the perifissural right upper lobe (3:37). Small bilateral pleural effusions with associated passive atelectasis of adjacent lung parenchyma (greater on the left).   MEDIASTINUM: Normal heart size. Dense mitral valve and aortic valve calcifications. Three-vessel calcified atherosclerotic disease of the coronary arteries. No pericardial effusion. Calcified atherosclerotic disease of the aortic arch and descending thoracic aorta. Normal caliber thoracic aorta. The main pulmonary artery is normal in caliber. Multiple prominent mediastinal lymph nodes. Precarinal lymph node measuring 1.0 cm. (3:39). Small sliding-type hiatal hernia.   IMAGED ABDOMEN: Calcified granuloma at the medial hepatic dome. The upper abdomen is otherwise unremarkable.   SOFT TISSUES: Mildly prominent bilateral axillary lymph nodes measuring up to 1.1 cm in short axis bilaterally. Benign-appearing calcifications are seen throughout the left breast. Soft tissues are otherwise unremarkable.   BONES: S-shaped scoliosis of the thoracolumbar spine, with multilevel degenerative changes seen throughout  multilevel anterior bridging osteophytes, which could potentially be seen in the setting of DISH. No concerning osseous lesions.   IMPRESSION:  1.Multifocal bronchiectasis, with superimposed groundglass opacification and smooth interlobular septal thickening seen throughout both lungs. These findings are most pronounced within the left upper lobe Additional nodular and consolidative opacities with superimposed tree-in-bud nodularity seen throughout the dependent aspects of both lower lobes. While nonspecific, these findings could potentially be seen in the setting of developing atypical infectious processes and/or some component of multifocal aspiration. Repeat chest CT is recommended in 3-6 weeks to ensure resolution/clearing.  2.Small bilateral pleural effusions with associated passive atelectasis of adjacent lung parenchyma (left greater than right).  3.80m rounded perifissural nodule within the right upper lobe. Of note, an outside study dated 2019 reported a similar nodule measuring the same size per review of care everywhere. Continued attention is recommended on follow up.  4.Other chronic or incidental findings, as noted above.   --Multiple prominent mediastinal and bilateral axillary lymph nodes as described above. Findings may be reactive in nature, however cannot exclude diffuse process. Recommend clinical correlation.   --Additional chronic and incidental findings, as above.  Disposition: Home with Home Health  Consults: Pulmonology  Discharge Instructions: Follow up here  Disease/illness Education: Discussed today  Home Health/Community Services Discussions/Referrals: In place  Establishment or re-establishment of referral  orders for community resources: In place  Discussion with other health care providers: None  Assessment and Support of treatment regimen adherence:  Good  Appointments Coordinated with: Patient  Education for self-management, independent living, and ADLs:  Discussed today  Since getting out of the hospital, Savannah Mcclure has been feeling OK. She feels like she is starting to get her strength back. She has been getting her stamina back. She has been doing home health, looking to get them back in. No coughing. No fevers. No SOB. No wheezing.    Relevant past medical, surgical, family and social history reviewed and updated as indicated. Interim medical history since our last visit reviewed. Allergies and medications reviewed and updated.  Review of Systems  Constitutional:  Positive for fatigue. Negative for activity change, appetite change, chills, diaphoresis, fever and unexpected weight change.  HENT: Negative.    Eyes: Negative.   Respiratory: Negative.    Cardiovascular: Negative.   Gastrointestinal: Negative.   Musculoskeletal: Negative.   Neurological: Negative.   Psychiatric/Behavioral: Negative.     Per HPI unless specifically indicated above     Objective:    BP 121/62   Pulse 93   Temp 98.6 F (37 C) (Oral)   Ht '5\' 1"'  (1.549 m)   Wt 107 lb 9.6 oz (48.8 kg)   SpO2 94%   BMI 20.33 kg/m   Wt Readings from Last 3 Encounters:  03/15/21 107 lb 9.6 oz (48.8 kg)  12/23/20 111 lb (50.3 kg)  12/22/20 111 lb (50.3 kg)    Physical Exam Vitals and nursing note reviewed.  Constitutional:      General: She is not in acute distress.    Appearance: Normal appearance. She is not ill-appearing, toxic-appearing or diaphoretic.  HENT:     Head: Normocephalic and atraumatic.     Right Ear: External ear normal.     Left Ear: External ear normal.     Nose: Nose normal.     Mouth/Throat:     Mouth: Mucous membranes are moist.     Pharynx: Oropharynx is clear.  Eyes:     General: No scleral icterus.       Right eye: No discharge.        Left eye: No discharge.     Extraocular Movements: Extraocular movements intact.     Conjunctiva/sclera: Conjunctivae normal.     Pupils: Pupils are equal, round, and reactive to light.  Cardiovascular:      Rate and Rhythm: Normal rate and regular rhythm.     Pulses: Normal pulses.     Heart sounds: Normal heart sounds. No murmur heard.   No friction rub. No gallop.  Pulmonary:     Effort: Pulmonary effort is normal. No respiratory distress.     Breath sounds: Normal breath sounds. No stridor. No wheezing, rhonchi or rales.  Chest:     Chest wall: No tenderness.  Musculoskeletal:        General: Normal range of motion.     Cervical back: Normal range of motion and neck supple.  Skin:    General: Skin is warm and dry.     Capillary Refill: Capillary refill takes less than 2 seconds.     Coloration: Skin is not jaundiced or pale.     Findings: No bruising, erythema, lesion or rash.  Neurological:     General: No focal deficit present.     Mental Status: She is alert and oriented to person, place, and time. Mental status is at  baseline.  Psychiatric:        Mood and Affect: Mood normal.        Behavior: Behavior normal.        Thought Content: Thought content normal.        Judgment: Judgment normal.    Results for orders placed or performed in visit on 03/15/21  CBC with Differential/Platelet  Result Value Ref Range   WBC 9.5 3.4 - 10.8 x10E3/uL   RBC 4.19 3.77 - 5.28 x10E6/uL   Hemoglobin 12.5 11.1 - 15.9 g/dL   Hematocrit 38.0 34.0 - 46.6 %   MCV 91 79 - 97 fL   MCH 29.8 26.6 - 33.0 pg   MCHC 32.9 31.5 - 35.7 g/dL   RDW 13.6 11.7 - 15.4 %   Platelets 456 (H) 150 - 450 x10E3/uL   Neutrophils 52 Not Estab. %   Lymphs 36 Not Estab. %   Monocytes 7 Not Estab. %   Eos 4 Not Estab. %   Basos 1 Not Estab. %   Neutrophils Absolute 4.9 1.4 - 7.0 x10E3/uL   Lymphocytes Absolute 3.4 (H) 0.7 - 3.1 x10E3/uL   Monocytes Absolute 0.6 0.1 - 0.9 x10E3/uL   EOS (ABSOLUTE) 0.4 0.0 - 0.4 x10E3/uL   Basophils Absolute 0.1 0.0 - 0.2 x10E3/uL   Immature Granulocytes 0 Not Estab. %   Immature Grans (Abs) 0.0 0.0 - 0.1 x10E3/uL  Comprehensive metabolic panel  Result Value Ref Range    Glucose 73 65 - 99 mg/dL   BUN 8 8 - 27 mg/dL   Creatinine, Ser 0.61 0.57 - 1.00 mg/dL   eGFR 90 >59 mL/min/1.73   BUN/Creatinine Ratio 13 12 - 28   Sodium 142 134 - 144 mmol/L   Potassium 3.9 3.5 - 5.2 mmol/L   Chloride 102 96 - 106 mmol/L   CO2 28 20 - 29 mmol/L   Calcium 9.2 8.7 - 10.3 mg/dL   Total Protein 6.3 6.0 - 8.5 g/dL   Albumin 3.9 3.7 - 4.7 g/dL   Globulin, Total 2.4 1.5 - 4.5 g/dL   Albumin/Globulin Ratio 1.6 1.2 - 2.2   Bilirubin Total 0.2 0.0 - 1.2 mg/dL   Alkaline Phosphatase 87 44 - 121 IU/L   AST 14 0 - 40 IU/L   ALT 8 0 - 32 IU/L  Magnesium  Result Value Ref Range   Magnesium 1.7 1.6 - 2.3 mg/dL      Assessment & Plan:   Problem List Items Addressed This Visit       Respiratory   Bronchiectasis without complication ([place]) - Primary    Doing better. Lungs clear. Continue to follow with pulmonology. Continue to use inhalers. Refills given. Call with any concerns.        Relevant Orders   CBC with Differential/Platelet (Completed)   Other Visit Diagnoses     Abnormal liver enzymes       Rechecking labs today. Await results. Treat as needed.    Relevant Orders   Comprehensive metabolic panel (Completed)   Hypomagnesemia       Rechecking labs today. Await results. Treat as needed.    Relevant Orders   Magnesium (Completed)        Follow up plan: Return As scheduled.   >30 spent with patient today

## 2021-03-16 ENCOUNTER — Encounter: Payer: Self-pay | Admitting: Family Medicine

## 2021-03-16 DIAGNOSIS — J479 Bronchiectasis, uncomplicated: Secondary | ICD-10-CM | POA: Insufficient documentation

## 2021-03-16 LAB — COMPREHENSIVE METABOLIC PANEL
ALT: 8 IU/L (ref 0–32)
AST: 14 IU/L (ref 0–40)
Albumin/Globulin Ratio: 1.6 (ref 1.2–2.2)
Albumin: 3.9 g/dL (ref 3.7–4.7)
Alkaline Phosphatase: 87 IU/L (ref 44–121)
BUN/Creatinine Ratio: 13 (ref 12–28)
BUN: 8 mg/dL (ref 8–27)
Bilirubin Total: 0.2 mg/dL (ref 0.0–1.2)
CO2: 28 mmol/L (ref 20–29)
Calcium: 9.2 mg/dL (ref 8.7–10.3)
Chloride: 102 mmol/L (ref 96–106)
Creatinine, Ser: 0.61 mg/dL (ref 0.57–1.00)
Globulin, Total: 2.4 g/dL (ref 1.5–4.5)
Glucose: 73 mg/dL (ref 65–99)
Potassium: 3.9 mmol/L (ref 3.5–5.2)
Sodium: 142 mmol/L (ref 134–144)
Total Protein: 6.3 g/dL (ref 6.0–8.5)
eGFR: 90 mL/min/{1.73_m2} (ref >59)

## 2021-03-16 LAB — CBC WITH DIFFERENTIAL/PLATELET
Basophils Absolute: 0.1 10*3/uL (ref 0.0–0.2)
Basos: 1 %
EOS (ABSOLUTE): 0.4 10*3/uL (ref 0.0–0.4)
Eos: 4 %
Hematocrit: 38 % (ref 34.0–46.6)
Hemoglobin: 12.5 g/dL (ref 11.1–15.9)
Immature Grans (Abs): 0 10*3/uL (ref 0.0–0.1)
Immature Granulocytes: 0 %
Lymphocytes Absolute: 3.4 10*3/uL — ABNORMAL HIGH (ref 0.7–3.1)
Lymphs: 36 %
MCH: 29.8 pg (ref 26.6–33.0)
MCHC: 32.9 g/dL (ref 31.5–35.7)
MCV: 91 fL (ref 79–97)
Monocytes Absolute: 0.6 10*3/uL (ref 0.1–0.9)
Monocytes: 7 %
Neutrophils Absolute: 4.9 10*3/uL (ref 1.4–7.0)
Neutrophils: 52 %
Platelets: 456 10*3/uL — ABNORMAL HIGH (ref 150–450)
RBC: 4.19 x10E6/uL (ref 3.77–5.28)
RDW: 13.6 % (ref 11.7–15.4)
WBC: 9.5 10*3/uL (ref 3.4–10.8)

## 2021-03-16 LAB — MAGNESIUM: Magnesium: 1.7 mg/dL (ref 1.6–2.3)

## 2021-03-16 NOTE — Assessment & Plan Note (Signed)
Doing better. Lungs clear. Continue to follow with pulmonology. Continue to use inhalers. Refills given. Call with any concerns.

## 2021-03-17 ENCOUNTER — Telehealth: Payer: Self-pay | Admitting: *Deleted

## 2021-03-17 DIAGNOSIS — E876 Hypokalemia: Secondary | ICD-10-CM | POA: Diagnosis not present

## 2021-03-17 DIAGNOSIS — J440 Chronic obstructive pulmonary disease with acute lower respiratory infection: Secondary | ICD-10-CM | POA: Diagnosis not present

## 2021-03-17 DIAGNOSIS — J471 Bronchiectasis with (acute) exacerbation: Secondary | ICD-10-CM | POA: Diagnosis not present

## 2021-03-17 DIAGNOSIS — I10 Essential (primary) hypertension: Secondary | ICD-10-CM | POA: Diagnosis not present

## 2021-03-17 DIAGNOSIS — J189 Pneumonia, unspecified organism: Secondary | ICD-10-CM | POA: Diagnosis not present

## 2021-03-17 DIAGNOSIS — E440 Moderate protein-calorie malnutrition: Secondary | ICD-10-CM | POA: Diagnosis not present

## 2021-03-17 MED ORDER — FLUCONAZOLE 150 MG PO TABS: 150.0000 mg | ORAL_TABLET | Freq: Every day | ORAL | 0 refills | Status: DC

## 2021-03-17 NOTE — Telephone Encounter (Signed)
Routing to patient's provider to advise.

## 2021-03-17 NOTE — Telephone Encounter (Signed)
Cristy Friedlander, RN with [facility] visit with patient today.  Patient has itching and is raw in the vaginal area. She has been on an antibiotic recently. Been using OTC monostat with no improvement. Requesting prescription for yeast. Pharmacy on file is [facility].

## 2021-03-17 NOTE — Addendum Note (Signed)
Addended by: [name] on: 03/17/2021 12:19 PM   Modules accepted: Orders

## 2021-03-18 DIAGNOSIS — J189 Pneumonia, unspecified organism: Secondary | ICD-10-CM | POA: Diagnosis not present

## 2021-03-18 DIAGNOSIS — J471 Bronchiectasis with (acute) exacerbation: Secondary | ICD-10-CM | POA: Diagnosis not present

## 2021-03-18 DIAGNOSIS — E440 Moderate protein-calorie malnutrition: Secondary | ICD-10-CM | POA: Diagnosis not present

## 2021-03-18 DIAGNOSIS — E876 Hypokalemia: Secondary | ICD-10-CM | POA: Diagnosis not present

## 2021-03-18 DIAGNOSIS — I10 Essential (primary) hypertension: Secondary | ICD-10-CM | POA: Diagnosis not present

## 2021-03-18 DIAGNOSIS — J440 Chronic obstructive pulmonary disease with acute lower respiratory infection: Secondary | ICD-10-CM | POA: Diagnosis not present

## 2021-03-25 DIAGNOSIS — J189 Pneumonia, unspecified organism: Secondary | ICD-10-CM | POA: Diagnosis not present

## 2021-03-25 DIAGNOSIS — E876 Hypokalemia: Secondary | ICD-10-CM | POA: Diagnosis not present

## 2021-03-25 DIAGNOSIS — J471 Bronchiectasis with (acute) exacerbation: Secondary | ICD-10-CM | POA: Diagnosis not present

## 2021-03-25 DIAGNOSIS — J440 Chronic obstructive pulmonary disease with acute lower respiratory infection: Secondary | ICD-10-CM | POA: Diagnosis not present

## 2021-03-25 DIAGNOSIS — I10 Essential (primary) hypertension: Secondary | ICD-10-CM | POA: Diagnosis not present

## 2021-03-25 DIAGNOSIS — E440 Moderate protein-calorie malnutrition: Secondary | ICD-10-CM | POA: Diagnosis not present

## 2021-03-26 DIAGNOSIS — E440 Moderate protein-calorie malnutrition: Secondary | ICD-10-CM | POA: Diagnosis not present

## 2021-03-26 DIAGNOSIS — J440 Chronic obstructive pulmonary disease with acute lower respiratory infection: Secondary | ICD-10-CM | POA: Diagnosis not present

## 2021-03-26 DIAGNOSIS — J471 Bronchiectasis with (acute) exacerbation: Secondary | ICD-10-CM | POA: Diagnosis not present

## 2021-03-26 DIAGNOSIS — J189 Pneumonia, unspecified organism: Secondary | ICD-10-CM | POA: Diagnosis not present

## 2021-03-26 DIAGNOSIS — I10 Essential (primary) hypertension: Secondary | ICD-10-CM | POA: Diagnosis not present

## 2021-03-26 DIAGNOSIS — E876 Hypokalemia: Secondary | ICD-10-CM | POA: Diagnosis not present

## 2021-03-29 ENCOUNTER — Telehealth: Payer: Self-pay

## 2021-03-29 DIAGNOSIS — G894 Chronic pain syndrome: Secondary | ICD-10-CM

## 2021-03-29 DIAGNOSIS — M47814 Spondylosis without myelopathy or radiculopathy, thoracic region: Secondary | ICD-10-CM

## 2021-03-29 DIAGNOSIS — M4120 Other idiopathic scoliosis, site unspecified: Secondary | ICD-10-CM

## 2021-03-29 MED ORDER — GABAPENTIN 300 MG PO CAPS: 300.0000 mg | ORAL_CAPSULE | Freq: Two times a day (BID) | ORAL | 6 refills | Status: DC

## 2021-03-29 NOTE — Telephone Encounter (Signed)
Patient notified

## 2021-03-29 NOTE — Telephone Encounter (Signed)
Pt is requesting refill on Gabapentin 

## 2021-04-01 DIAGNOSIS — J189 Pneumonia, unspecified organism: Secondary | ICD-10-CM | POA: Diagnosis not present

## 2021-04-01 DIAGNOSIS — E440 Moderate protein-calorie malnutrition: Secondary | ICD-10-CM | POA: Diagnosis not present

## 2021-04-01 DIAGNOSIS — I10 Essential (primary) hypertension: Secondary | ICD-10-CM | POA: Diagnosis not present

## 2021-04-01 DIAGNOSIS — J440 Chronic obstructive pulmonary disease with acute lower respiratory infection: Secondary | ICD-10-CM | POA: Diagnosis not present

## 2021-04-01 DIAGNOSIS — E876 Hypokalemia: Secondary | ICD-10-CM | POA: Diagnosis not present

## 2021-04-01 DIAGNOSIS — J471 Bronchiectasis with (acute) exacerbation: Secondary | ICD-10-CM | POA: Diagnosis not present

## 2021-04-02 DIAGNOSIS — J471 Bronchiectasis with (acute) exacerbation: Secondary | ICD-10-CM | POA: Diagnosis not present

## 2021-04-02 DIAGNOSIS — J440 Chronic obstructive pulmonary disease with acute lower respiratory infection: Secondary | ICD-10-CM | POA: Diagnosis not present

## 2021-04-02 DIAGNOSIS — J189 Pneumonia, unspecified organism: Secondary | ICD-10-CM | POA: Diagnosis not present

## 2021-04-02 DIAGNOSIS — E440 Moderate protein-calorie malnutrition: Secondary | ICD-10-CM | POA: Diagnosis not present

## 2021-04-02 DIAGNOSIS — E876 Hypokalemia: Secondary | ICD-10-CM | POA: Diagnosis not present

## 2021-04-02 DIAGNOSIS — I10 Essential (primary) hypertension: Secondary | ICD-10-CM | POA: Diagnosis not present

## 2021-04-05 DIAGNOSIS — Z961 Presence of intraocular lens: Secondary | ICD-10-CM | POA: Diagnosis not present

## 2021-04-06 DIAGNOSIS — J440 Chronic obstructive pulmonary disease with acute lower respiratory infection: Secondary | ICD-10-CM | POA: Diagnosis not present

## 2021-04-06 DIAGNOSIS — J189 Pneumonia, unspecified organism: Secondary | ICD-10-CM | POA: Diagnosis not present

## 2021-04-06 DIAGNOSIS — E876 Hypokalemia: Secondary | ICD-10-CM | POA: Diagnosis not present

## 2021-04-06 DIAGNOSIS — I10 Essential (primary) hypertension: Secondary | ICD-10-CM | POA: Diagnosis not present

## 2021-04-06 DIAGNOSIS — J471 Bronchiectasis with (acute) exacerbation: Secondary | ICD-10-CM | POA: Diagnosis not present

## 2021-04-06 DIAGNOSIS — E440 Moderate protein-calorie malnutrition: Secondary | ICD-10-CM | POA: Diagnosis not present

## 2021-04-08 DIAGNOSIS — F17210 Nicotine dependence, cigarettes, uncomplicated: Secondary | ICD-10-CM | POA: Diagnosis not present

## 2021-04-08 DIAGNOSIS — E039 Hypothyroidism, unspecified: Secondary | ICD-10-CM | POA: Diagnosis not present

## 2021-04-08 DIAGNOSIS — Z8616 Personal history of COVID-19: Secondary | ICD-10-CM | POA: Diagnosis not present

## 2021-04-08 DIAGNOSIS — E440 Moderate protein-calorie malnutrition: Secondary | ICD-10-CM | POA: Diagnosis not present

## 2021-04-08 DIAGNOSIS — E876 Hypokalemia: Secondary | ICD-10-CM | POA: Diagnosis not present

## 2021-04-08 DIAGNOSIS — G894 Chronic pain syndrome: Secondary | ICD-10-CM | POA: Diagnosis not present

## 2021-04-08 DIAGNOSIS — J471 Bronchiectasis with (acute) exacerbation: Secondary | ICD-10-CM | POA: Diagnosis not present

## 2021-04-08 DIAGNOSIS — J440 Chronic obstructive pulmonary disease with acute lower respiratory infection: Secondary | ICD-10-CM | POA: Diagnosis not present

## 2021-04-08 DIAGNOSIS — J189 Pneumonia, unspecified organism: Secondary | ICD-10-CM | POA: Diagnosis not present

## 2021-04-08 DIAGNOSIS — I10 Essential (primary) hypertension: Secondary | ICD-10-CM | POA: Diagnosis not present

## 2021-04-13 ENCOUNTER — Ambulatory Visit (INDEPENDENT_AMBULATORY_CARE_PROVIDER_SITE_OTHER): Payer: Medicare Other | Admitting: Vascular Surgery

## 2021-04-13 ENCOUNTER — Encounter (INDEPENDENT_AMBULATORY_CARE_PROVIDER_SITE_OTHER): Payer: Medicare Other

## 2021-04-14 DIAGNOSIS — I10 Essential (primary) hypertension: Secondary | ICD-10-CM | POA: Diagnosis not present

## 2021-04-14 DIAGNOSIS — J189 Pneumonia, unspecified organism: Secondary | ICD-10-CM | POA: Diagnosis not present

## 2021-04-14 DIAGNOSIS — E440 Moderate protein-calorie malnutrition: Secondary | ICD-10-CM | POA: Diagnosis not present

## 2021-04-14 DIAGNOSIS — J471 Bronchiectasis with (acute) exacerbation: Secondary | ICD-10-CM | POA: Diagnosis not present

## 2021-04-14 DIAGNOSIS — E876 Hypokalemia: Secondary | ICD-10-CM | POA: Diagnosis not present

## 2021-04-14 DIAGNOSIS — J440 Chronic obstructive pulmonary disease with acute lower respiratory infection: Secondary | ICD-10-CM | POA: Diagnosis not present

## 2021-04-16 DIAGNOSIS — J440 Chronic obstructive pulmonary disease with acute lower respiratory infection: Secondary | ICD-10-CM | POA: Diagnosis not present

## 2021-04-16 DIAGNOSIS — I10 Essential (primary) hypertension: Secondary | ICD-10-CM | POA: Diagnosis not present

## 2021-04-16 DIAGNOSIS — J471 Bronchiectasis with (acute) exacerbation: Secondary | ICD-10-CM | POA: Diagnosis not present

## 2021-04-16 DIAGNOSIS — J189 Pneumonia, unspecified organism: Secondary | ICD-10-CM | POA: Diagnosis not present

## 2021-04-16 DIAGNOSIS — E440 Moderate protein-calorie malnutrition: Secondary | ICD-10-CM | POA: Diagnosis not present

## 2021-04-16 DIAGNOSIS — E876 Hypokalemia: Secondary | ICD-10-CM | POA: Diagnosis not present

## 2021-04-20 ENCOUNTER — Other Ambulatory Visit: Payer: Self-pay

## 2021-04-20 ENCOUNTER — Encounter: Payer: Self-pay | Admitting: Family Medicine

## 2021-04-20 ENCOUNTER — Ambulatory Visit (INDEPENDENT_AMBULATORY_CARE_PROVIDER_SITE_OTHER): Payer: Medicare Other | Admitting: Family Medicine

## 2021-04-20 VITALS — BP 154/84 | HR 81 | Temp 98.2°F | Ht 60.98 in | Wt 109.8 lb

## 2021-04-20 DIAGNOSIS — I6523 Occlusion and stenosis of bilateral carotid arteries: Secondary | ICD-10-CM

## 2021-04-20 DIAGNOSIS — F419 Anxiety disorder, unspecified: Secondary | ICD-10-CM

## 2021-04-20 DIAGNOSIS — E785 Hyperlipidemia, unspecified: Secondary | ICD-10-CM

## 2021-04-20 DIAGNOSIS — D649 Anemia, unspecified: Secondary | ICD-10-CM | POA: Diagnosis not present

## 2021-04-20 DIAGNOSIS — J410 Simple chronic bronchitis: Secondary | ICD-10-CM | POA: Diagnosis not present

## 2021-04-20 DIAGNOSIS — J479 Bronchiectasis, uncomplicated: Secondary | ICD-10-CM

## 2021-04-20 DIAGNOSIS — E039 Hypothyroidism, unspecified: Secondary | ICD-10-CM

## 2021-04-20 DIAGNOSIS — I10 Essential (primary) hypertension: Secondary | ICD-10-CM | POA: Diagnosis not present

## 2021-04-20 DIAGNOSIS — I700 Atherosclerosis of aorta: Secondary | ICD-10-CM

## 2021-04-20 MED ORDER — ATENOLOL 25 MG PO TABS: 25.0000 mg | ORAL_TABLET | Freq: Every day | ORAL | 1 refills | Status: DC

## 2021-04-20 MED ORDER — CILOSTAZOL 100 MG PO TABS: 100.0000 mg | ORAL_TABLET | Freq: Two times a day (BID) | ORAL | 1 refills | Status: DC

## 2021-04-20 MED ORDER — BENAZEPRIL HCL 10 MG PO TABS: 10.0000 mg | ORAL_TABLET | Freq: Every day | ORAL | 1 refills | Status: DC

## 2021-04-20 MED ORDER — ESCITALOPRAM OXALATE 5 MG PO TABS: ORAL_TABLET | ORAL | 3 refills | Status: DC

## 2021-04-20 MED ORDER — ALBUTEROL SULFATE HFA 108 (90 BASE) MCG/ACT IN AERS: 2.0000 | INHALATION_SPRAY | Freq: Four times a day (QID) | RESPIRATORY_TRACT | 0 refills | Status: DC | PRN

## 2021-04-20 NOTE — Assessment & Plan Note (Signed)
Rechecking labs today. Await results. Treat as needed.  °

## 2021-04-20 NOTE — Assessment & Plan Note (Signed)
Will keep BP and cholesterol under good control. Await results. Treat as needed.

## 2021-04-20 NOTE — Assessment & Plan Note (Signed)
Not under good control. Will add low dose lexapro and recheck 1 month. Call with any concerns.

## 2021-04-20 NOTE — Progress Notes (Signed)
BP (!) 154/84   Pulse 81   Temp 98.2 F (36.8 C) (Oral)   Ht 5' 0.98" (1.549 m)   Wt 109 lb 12.8 oz (49.8 kg)   SpO2 96%   BMI 20.76 kg/m    Subjective:    Patient ID: Savannah Mcclure, female    DOB: 1940-12-15, 80 y.o.   MRN: [id number]  HPI: Savannah Mcclure is a 80 y.o. female  Chief Complaint  Patient presents with   Hypertension   Hypothyroidism   Cough    Patient states that her cough returned about 2 weeks ago   HYPERTENSION / [place] Satisfied with current treatment? yes Duration of hypertension: chronic BP monitoring frequency: not checking BP medication side effects: no Past BP meds: atenolol, benazepril Duration of hyperlipidemia: chronic Cholesterol medication side effects: not on anything Cholesterol supplements: none Past cholesterol medications: none Medication compliance: excellent compliance Aspirin: yes Recent stressors: yes Recurrent headaches: no Visual changes: no Palpitations: no Dyspnea: no Chest pain: no Lower extremity edema: no Dizzy/lightheaded: no  HYPOTHYROIDISM Thyroid control status:stable Satisfied with current treatment? yes Medication side effects: no Medication compliance: excellent compliance Recent dose adjustment:yes Fatigue: yes Cold intolerance: no Heat intolerance: no Weight gain: no Weight loss: no Constipation: no Diarrhea/loose stools: no Palpitations: no Lower extremity edema: no Anxiety/depressed mood: yes  COPD COPD status: uncontrolled Satisfied with current treatment?: no Oxygen use: no Dyspnea frequency: occasionally Cough frequency: in the AM and PM Rescue inhaler frequency:  has not used it Limitation of activity: yes Productive cough: yes Pneumovax: Up to Date Influenza:  not available  ANXIETY/DEPRESSION Duration:exacerbated Anxious mood: yes  Excessive worrying: yes Irritability: yes  Sweating: no Nausea: no Palpitations:no Hyperventilation: no Panic  attacks: no Agoraphobia: no  Obscessions/compulsions: no Depressed mood: no Depression screen [facility] 2/9 04/20/2021 12/23/2020 09/10/2020 03/09/2020 04/25/2019  Decreased Interest 0 0 0 2 0  Down, Depressed, Hopeless 0 0 0 2 0  PHQ - 2 Score 0 0 0 4 0  Altered sleeping - - 0 0 2  Tired, decreased energy - - 0 1 1  Change in appetite - - 0 1 0  Feeling bad or failure about yourself  - - 0 0 0  Trouble concentrating - - 0 0 0  Moving slowly or fidgety/restless - - 0 0 0  Suicidal thoughts - - 0 0 0  PHQ-9 Score - - 0 6 3  Difficult doing work/chores - - - Somewhat difficult Not difficult at all  Some recent data might be hidden   Anhedonia: no Weight changes: no Insomnia: no   Hypersomnia: no Fatigue/loss of energy: yes Feelings of worthlessness: no Feelings of guilt: no Impaired concentration/indecisiveness: no Suicidal ideations: no  Crying spells: no Recent Stressors/Life Changes: yes   Relationship problems: no   Family stress: yes     Financial stress: yes    Job stress: no    Recent death/loss: no   Relevant past medical, surgical, family and social history reviewed and updated as indicated. Interim medical history since our last visit reviewed. Allergies and medications reviewed and updated.  Review of Systems  Constitutional: Negative.   Respiratory:  Positive for cough. Negative for apnea, choking, chest tightness, shortness of breath, wheezing and stridor.   Cardiovascular: Negative.   Gastrointestinal: Negative.   Musculoskeletal: Negative.   Psychiatric/Behavioral:  Negative for agitation, behavioral problems, confusion, decreased concentration, dysphoric mood, hallucinations, self-injury, sleep disturbance and suicidal ideas. The patient is nervous/anxious. The patient is not hyperactive.  Per HPI unless specifically indicated above     Objective:    BP (!) 154/84   Pulse 81   Temp 98.2 F (36.8 C) (Oral)   Ht 5' 0.98" (1.549 m)   Wt 109 lb 12.8 oz (49.8  kg)   SpO2 96%   BMI 20.76 kg/m   Wt Readings from Last 3 Encounters:  04/20/21 109 lb 12.8 oz (49.8 kg)  03/15/21 107 lb 9.6 oz (48.8 kg)  12/23/20 111 lb (50.3 kg)    Physical Exam Vitals and nursing note reviewed.  Constitutional:      General: She is not in acute distress.    Appearance: Normal appearance. She is not ill-appearing, toxic-appearing or diaphoretic.  HENT:     Head: Normocephalic and atraumatic.     Right Ear: External ear normal.     Left Ear: External ear normal.     Nose: Nose normal.     Mouth/Throat:     Mouth: Mucous membranes are moist.     Pharynx: Oropharynx is clear.  Eyes:     General: No scleral icterus.       Right eye: No discharge.        Left eye: No discharge.     Extraocular Movements: Extraocular movements intact.     Conjunctiva/sclera: Conjunctivae normal.     Pupils: Pupils are equal, round, and reactive to light.  Cardiovascular:     Rate and Rhythm: Normal rate and regular rhythm.     Pulses: Normal pulses.     Heart sounds: Normal heart sounds. No murmur heard.   No friction rub. No gallop.  Pulmonary:     Effort: Pulmonary effort is normal. No respiratory distress.     Breath sounds: Normal breath sounds. No stridor. No wheezing, rhonchi or rales.  Chest:     Chest wall: No tenderness.  Musculoskeletal:        General: Normal range of motion.     Cervical back: Normal range of motion and neck supple.  Skin:    General: Skin is warm and dry.     Capillary Refill: Capillary refill takes less than 2 seconds.     Coloration: Skin is not jaundiced or pale.     Findings: No bruising, erythema, lesion or rash.  Neurological:     General: No focal deficit present.     Mental Status: She is alert and oriented to person, place, and time. Mental status is at baseline.  Psychiatric:        Mood and Affect: Mood normal.        Behavior: Behavior normal.        Thought Content: Thought content normal.        Judgment: Judgment  normal.    Results for orders placed or performed in visit on 03/15/21  CBC with Differential/Platelet  Result Value Ref Range   WBC 9.5 3.4 - 10.8 x10E3/uL   RBC 4.19 3.77 - 5.28 x10E6/uL   Hemoglobin 12.5 11.1 - 15.9 g/dL   Hematocrit 38.0 34.0 - 46.6 %   MCV 91 79 - 97 fL   MCH 29.8 26.6 - 33.0 pg   MCHC 32.9 31.5 - 35.7 g/dL   RDW 13.6 11.7 - 15.4 %   Platelets 456 (H) 150 - 450 x10E3/uL   Neutrophils 52 Not Estab. %   Lymphs 36 Not Estab. %   Monocytes 7 Not Estab. %   Eos 4 Not Estab. %   Basos 1 Not Estab. %  Neutrophils Absolute 4.9 1.4 - 7.0 x10E3/uL   Lymphocytes Absolute 3.4 (H) 0.7 - 3.1 x10E3/uL   Monocytes Absolute 0.6 0.1 - 0.9 x10E3/uL   EOS (ABSOLUTE) 0.4 0.0 - 0.4 x10E3/uL   Basophils Absolute 0.1 0.0 - 0.2 x10E3/uL   Immature Granulocytes 0 Not Estab. %   Immature Grans (Abs) 0.0 0.0 - 0.1 x10E3/uL  Comprehensive metabolic panel  Result Value Ref Range   Glucose 73 65 - 99 mg/dL   BUN 8 8 - 27 mg/dL   Creatinine, Ser 0.61 0.57 - 1.00 mg/dL   eGFR 90 >59 mL/min/1.73   BUN/Creatinine Ratio 13 12 - 28   Sodium 142 134 - 144 mmol/L   Potassium 3.9 3.5 - 5.2 mmol/L   Chloride 102 96 - 106 mmol/L   CO2 28 20 - 29 mmol/L   Calcium 9.2 8.7 - 10.3 mg/dL   Total Protein 6.3 6.0 - 8.5 g/dL   Albumin 3.9 3.7 - 4.7 g/dL   Globulin, Total 2.4 1.5 - 4.5 g/dL   Albumin/Globulin Ratio 1.6 1.2 - 2.2   Bilirubin Total 0.2 0.0 - 1.2 mg/dL   Alkaline Phosphatase 87 44 - 121 IU/L   AST 14 0 - 40 IU/L   ALT 8 0 - 32 IU/L  Magnesium  Result Value Ref Range   Magnesium 1.7 1.6 - 2.3 mg/dL      Assessment & Plan:   Problem List Items Addressed This Visit       Cardiovascular and Mediastinum   HTN (hypertension) - Primary    Running a little high today- will watch diet and recheck 1 month. Call with any concerns.       Relevant Medications   benazepril (LOTENSIN) 10 MG tablet   atenolol (TENORMIN) 25 MG tablet   Other Relevant Orders   CBC with  Differential/Platelet   Comprehensive metabolic panel   Aortic atherosclerosis (HCC)    Will keep BP and cholesterol under good control. Await results. Treat as needed.       Relevant Medications   benazepril (LOTENSIN) 10 MG tablet   atenolol (TENORMIN) 25 MG tablet   Other Relevant Orders   CBC with Differential/Platelet   Comprehensive metabolic panel   Lipid Panel w/o Chol/HDL Ratio     Respiratory   COPD (chronic obstructive pulmonary disease) ([place])    Continue trelegy. Continue albuterol. Call with any concerns. Continue to follow with pulmonology as needed.       Relevant Medications   albuterol (VENTOLIN HFA) 108 (90 Base) MCG/ACT inhaler   Bronchiectasis without complication ([place])    Continue trelegy. Continue albuterol. Call with any concerns. Continue to follow with pulmonology as needed.         Endocrine   Acquired hypothyroidism    Rechecking labs today. Await results. Treat as needed.       Relevant Medications   atenolol (TENORMIN) 25 MG tablet   Other Relevant Orders   CBC with Differential/Platelet   Comprehensive metabolic panel   TSH     Other   Anxiety    Not under good control. Will add low dose lexapro and recheck 1 month. Call with any concerns.       Relevant Medications   escitalopram (LEXAPRO) 5 MG tablet   Other Relevant Orders   CBC with Differential/Platelet   Comprehensive metabolic panel   Anemia    Rechecking labs today. Await results. Treat as needed.       Relevant Orders   CBC with  Differential/Platelet   Comprehensive metabolic panel   Dyslipidemia    Rechecking labs today. Await results. Treat as needed.       Relevant Orders   CBC with Differential/Platelet   Comprehensive metabolic panel   Lipid Panel w/o Chol/HDL Ratio     Follow up plan: Return in about 4 weeks (around 05/18/2021).

## 2021-04-20 NOTE — Assessment & Plan Note (Signed)
Running a little high today- will watch diet and recheck 1 month. Call with any concerns.

## 2021-04-20 NOTE — Assessment & Plan Note (Signed)
Continue trelegy. Continue albuterol. Call with any concerns. Continue to follow with pulmonology as needed.

## 2021-04-21 ENCOUNTER — Encounter (INDEPENDENT_AMBULATORY_CARE_PROVIDER_SITE_OTHER): Payer: Self-pay | Admitting: Nurse Practitioner

## 2021-04-21 ENCOUNTER — Ambulatory Visit (INDEPENDENT_AMBULATORY_CARE_PROVIDER_SITE_OTHER): Payer: Medicare Other | Admitting: Nurse Practitioner

## 2021-04-21 ENCOUNTER — Ambulatory Visit (INDEPENDENT_AMBULATORY_CARE_PROVIDER_SITE_OTHER): Payer: Medicare Other

## 2021-04-21 VITALS — BP 170/71 | HR 76 | Resp 16 | Wt 109.8 lb

## 2021-04-21 DIAGNOSIS — I70213 Atherosclerosis of native arteries of extremities with intermittent claudication, bilateral legs: Secondary | ICD-10-CM

## 2021-04-21 DIAGNOSIS — Z87891 Personal history of nicotine dependence: Secondary | ICD-10-CM | POA: Diagnosis not present

## 2021-04-21 DIAGNOSIS — I6523 Occlusion and stenosis of bilateral carotid arteries: Secondary | ICD-10-CM

## 2021-04-21 DIAGNOSIS — I10 Essential (primary) hypertension: Secondary | ICD-10-CM | POA: Diagnosis not present

## 2021-04-21 LAB — CBC WITH DIFFERENTIAL/PLATELET
Basophils Absolute: 0.1 10*3/uL (ref 0.0–0.2)
Basos: 1 %
EOS (ABSOLUTE): 0.2 10*3/uL (ref 0.0–0.4)
Eos: 2 %
Hematocrit: 40.5 % (ref 34.0–46.6)
Hemoglobin: 13.5 g/dL (ref 11.1–15.9)
Immature Grans (Abs): 0 10*3/uL (ref 0.0–0.1)
Immature Granulocytes: 0 %
Lymphocytes Absolute: 2.4 10*3/uL (ref 0.7–3.1)
Lymphs: 33 %
MCH: 30.5 pg (ref 26.6–33.0)
MCHC: 33.3 g/dL (ref 31.5–35.7)
MCV: 92 fL (ref 79–97)
Monocytes Absolute: 0.4 10*3/uL (ref 0.1–0.9)
Monocytes: 6 %
Neutrophils Absolute: 4.2 10*3/uL (ref 1.4–7.0)
Neutrophils: 58 %
Platelets: 450 10*3/uL (ref 150–450)
RBC: 4.42 x10E6/uL (ref 3.77–5.28)
RDW: 13.1 % (ref 11.7–15.4)
WBC: 7.3 10*3/uL (ref 3.4–10.8)

## 2021-04-21 LAB — COMPREHENSIVE METABOLIC PANEL
ALT: 11 IU/L (ref 0–32)
AST: 18 IU/L (ref 0–40)
Albumin/Globulin Ratio: 2.1 (ref 1.2–2.2)
Albumin: 4.7 g/dL (ref 3.7–4.7)
Alkaline Phosphatase: 75 IU/L (ref 44–121)
BUN/Creatinine Ratio: 16 (ref 12–28)
BUN: 12 mg/dL (ref 8–27)
Bilirubin Total: 0.3 mg/dL (ref 0.0–1.2)
CO2: 26 mmol/L (ref 20–29)
Calcium: 9.6 mg/dL (ref 8.7–10.3)
Chloride: 101 mmol/L (ref 96–106)
Creatinine, Ser: 0.76 mg/dL (ref 0.57–1.00)
Globulin, Total: 2.2 g/dL (ref 1.5–4.5)
Glucose: 82 mg/dL (ref 65–99)
Potassium: 4.3 mmol/L (ref 3.5–5.2)
Sodium: 141 mmol/L (ref 134–144)
Total Protein: 6.9 g/dL (ref 6.0–8.5)
eGFR: 79 mL/min/{1.73_m2} (ref >59)

## 2021-04-21 LAB — LIPID PANEL W/O CHOL/HDL RATIO
Cholesterol, Total: 176 mg/dL (ref 100–199)
HDL: 48 mg/dL (ref >39)
LDL Chol Calc (NIH): 101 mg/dL — ABNORMAL HIGH (ref 0–99)
Triglycerides: 157 mg/dL — ABNORMAL HIGH (ref 0–149)
VLDL Cholesterol Cal: 27 mg/dL (ref 5–40)

## 2021-04-21 LAB — TSH: TSH: 5.92 u[IU]/mL — ABNORMAL HIGH (ref 0.450–4.500)

## 2021-04-22 DIAGNOSIS — E440 Moderate protein-calorie malnutrition: Secondary | ICD-10-CM | POA: Diagnosis not present

## 2021-04-22 DIAGNOSIS — J189 Pneumonia, unspecified organism: Secondary | ICD-10-CM | POA: Diagnosis not present

## 2021-04-22 DIAGNOSIS — J440 Chronic obstructive pulmonary disease with acute lower respiratory infection: Secondary | ICD-10-CM | POA: Diagnosis not present

## 2021-04-22 DIAGNOSIS — E876 Hypokalemia: Secondary | ICD-10-CM | POA: Diagnosis not present

## 2021-04-22 DIAGNOSIS — J471 Bronchiectasis with (acute) exacerbation: Secondary | ICD-10-CM | POA: Diagnosis not present

## 2021-04-22 DIAGNOSIS — I10 Essential (primary) hypertension: Secondary | ICD-10-CM | POA: Diagnosis not present

## 2021-04-23 ENCOUNTER — Other Ambulatory Visit: Payer: Self-pay | Admitting: Family Medicine

## 2021-04-23 DIAGNOSIS — E440 Moderate protein-calorie malnutrition: Secondary | ICD-10-CM | POA: Diagnosis not present

## 2021-04-23 DIAGNOSIS — J189 Pneumonia, unspecified organism: Secondary | ICD-10-CM | POA: Diagnosis not present

## 2021-04-23 DIAGNOSIS — I10 Essential (primary) hypertension: Secondary | ICD-10-CM | POA: Diagnosis not present

## 2021-04-23 DIAGNOSIS — J440 Chronic obstructive pulmonary disease with acute lower respiratory infection: Secondary | ICD-10-CM | POA: Diagnosis not present

## 2021-04-23 DIAGNOSIS — J471 Bronchiectasis with (acute) exacerbation: Secondary | ICD-10-CM | POA: Diagnosis not present

## 2021-04-23 DIAGNOSIS — E876 Hypokalemia: Secondary | ICD-10-CM | POA: Diagnosis not present

## 2021-04-23 MED ORDER — THYROID 60 MG PO TABS: 60.0000 mg | ORAL_TABLET | ORAL | 1 refills | Status: DC

## 2021-04-23 MED ORDER — THYROID 90 MG PO TABS: 90.0000 mg | ORAL_TABLET | ORAL | 1 refills | Status: DC

## 2021-04-26 ENCOUNTER — Encounter (INDEPENDENT_AMBULATORY_CARE_PROVIDER_SITE_OTHER): Payer: Self-pay | Admitting: Nurse Practitioner

## 2021-04-26 NOTE — Progress Notes (Signed)
Subjective:    Patient ID: Savannah Mcclure, female    DOB: 11/17/1940, 80 y.o.   MRN: [id number] Chief Complaint  Patient presents with   Follow-up    Ultrasound follow up    Savannah Mcclure is an 80 year old female that returns today for her noninvasive studies related to her carotid disease as well as peripheral arterial disease.  The patient denies any worsening claudication-like symptoms.  She denies any rest pain.  There have been no TIA or CVA-like symptoms.  Overall the patient has been doing well.  She also endorses being more active.  Today noninvasive studies show 1 to 39% stenosis in the right ICA with a 40 to 59% stenosis of the left ICA.  The bilateral vertebral arteries have antegrade flow with normal flow hemodynamics in the bilateral subclavian arteries.    The right ABI 0.87 with a left of 0.82.  Previously on 08/25/2020 the ABI was 0.71 on the right and 0.64 on the left.  The patient has biphasic tibial artery waveforms with good toe waveforms bilaterally.   Review of Systems  Eyes:  Negative for visual disturbance.  Skin:  Negative for wound.  All other systems reviewed and are negative.     Objective:   Physical Exam Vitals reviewed.  HENT:     Head: Normocephalic.  Cardiovascular:     Rate and Rhythm: Normal rate.     Comments: Nonpalpable pedal pulses Pulmonary:     Effort: Pulmonary effort is normal.  Skin:    General: Skin is warm and dry.  Neurological:     Mental Status: She is alert and oriented to person, place, and time.  Psychiatric:        Mood and Affect: Mood normal.        Behavior: Behavior normal.        Thought Content: Thought content normal.        Judgment: Judgment normal.    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    COPD (chronic obstructive pulmonary disease) ([place])    Delirium 03/18/2012   Dyspnea    GERD (gastroesophageal reflux disease)    Heart murmur    Hypertension    Leaky heart valve    Oxygen deficit    uses O2 at night 2 liters   Peripheral vascular disease ([place])    Scoliosis    Sepsis ([place])    Shock ([place]) 03/23/2012   SIRS (systemic inflammatory response syndrome) ([place]) 03/23/2012   Spinal stenosis    Stroke [facility])    Eye   Tachycardia     Social History   Socioeconomic History   Marital status: Widowed    Spouse name: Not on file   Number of children: 2   Years of education: college    Highest education level: Not on file  Occupational History   Occupation: retired  Tobacco Use  Smoking status: Every Day    Packs/day: 1.00    Years: 40.00    Pack years: 40.00    Types: Cigarettes   Smokeless tobacco: Never   Tobacco comments:    0.5PPD 12/22/20  Vaping Use   Vaping Use: Former  Substance and Sexual Activity   Alcohol use: Yes    Comment: wine on occasion (1x/month)   Drug use: No   Sexual activity: Not Currently  Other Topics Concern   Not on file  Social History Narrative   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 And grandkids (72 & 71 y.o.)   Social Determinants of Health   Financial Resource Strain: Not on file  Food Insecurity: Not on file  Transportation Needs: Not on file  Physical Activity: Not on file  Stress: Not on file  Social Connections: Not on file  Intimate Partner Violence: Not on file    Past Surgical History:  Procedure Laterality Date   ABDOMINAL HYSTERECTOMY     total   BACK SURGERY     BREAST LUMPECTOMY Left    CATARACT EXTRACTION W/PHACO Left 08/14/2019   Procedure: CATARACT  EXTRACTION PHACO AND INTRAOCULAR LENS PLACEMENT (IOC) LEFT 10.22 00:59.7 42.4%;  Surgeon: Leandrew Koyanagi, MD;  Location: [place];  Service: Ophthalmology;  Laterality: Left;  leave it last patient per [place] Right 06/20/2018   Procedure: LOWER EXTREMITY ANGIOGRAPHY;  Surgeon: Algernon Huxley, MD;  Location: [place] [facility];  Service: Cardiovascular;  Laterality: Right;   LOWER EXTREMITY ANGIOGRAPHY Left 02/03/2020   Procedure: LOWER EXTREMITY ANGIOGRAPHY;  Surgeon: Algernon Huxley, MD;  Location: [place] [facility];  Service: Cardiovascular;  Laterality: Left;   right eye surgery     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   WISDOM TOOTH EXTRACTION      Family History  Problem Relation Age of Onset   Intracerebral hemorrhage Mother    Hypertension Mother    Heart failure Father    Schizophrenia Daughter    Rheum arthritis Maternal Grandmother    Kidney disease Maternal Grandmother    Heart disease Paternal Grandmother    Stroke Paternal Grandmother    Stroke Paternal Grandfather    Heart disease Paternal Grandfather     Allergies  Allergen Reactions   Tramadol Rash   Other Itching    Cat Gut sutures    Oxycodone Hives   Amlodipine Hives   Augmentin [Amoxicillin-Pot Clavulanate] Diarrhea   Azithromycin Hives   Contrast Media [Iodinated Diagnostic Agents] Hives   Doxycycline Diarrhea   Erythromycin Diarrhea   Hydrocodone Nausea Only   Lactase    Lactose Intolerance (Gi) Diarrhea    Bloating   Minocycline Diarrhea   Nsaids Other (See Comments)    Internal Bleeding   Relafen [Nabumetone] Hives   Sulfa Antibiotics Hives   Tetracyclines & Related Diarrhea    CBC Latest Ref Rng & Units 04/20/2021 03/15/2021 11/16/2020  WBC 3.4 - 10.8 x10E3/uL 7.3 9.5 8.0  Hemoglobin 11.1 - 15.9 g/dL 13.5 12.5 12.8  Hematocrit 34.0 - 46.6 % 40.5 38.0 39.1  Platelets 150 - 450 x10E3/uL 450 456(H) 409      CMP     Component Value Date/Time   NA 141  04/20/2021 1353   NA 132 (L) 09/12/2012 1540   K 4.3 04/20/2021 1353   K 3.8 09/11/2012 0401   CL 101 04/20/2021 1353   CL 99 09/11/2012 0401   CO2 26 04/20/2021 1353   CO2 24  09/11/2012 0401   GLUCOSE 82 04/20/2021 1353   GLUCOSE 126 (H) 10/31/2017 1300   GLUCOSE 262 (H) 09/11/2012 0401   BUN 12 04/20/2021 1353   BUN 22 (H) 09/11/2012 0401   CREATININE 0.76 04/20/2021 1353   CREATININE 0.86 09/11/2012 0401   CALCIUM 9.6 04/20/2021 1353   CALCIUM 8.5 09/11/2012 0401   PROT 6.9 04/20/2021 1353   ALBUMIN 4.7 04/20/2021 1353   AST 18 04/20/2021 1353   ALT 11 04/20/2021 1353   ALKPHOS 75 04/20/2021 1353   BILITOT 0.3 04/20/2021 1353   GFRNONAA 78 09/10/2020 1409   GFRNONAA >60 09/11/2012 0401   GFRAA 90 09/10/2020 1409   GFRAA >60 09/11/2012 0401     No results found.     Assessment & Plan:   1. Atherosclerosis of native artery of both lower extremities with intermittent claudication [facility]) Patient continues to deny any significant worsening of her claudication-like symptoms.  Studies are slightly improved from previous studies.  Patient will continue on her current medication regiment with antiplatelet therapy.  We will follow-up in 6 months or sooner if the patient develops worsening symptoms.  2. Bilateral carotid artery stenosis The patient continues to remain asymptomatic.  The velocities remain within the 40 to 59% range in the left ICA.  It previously measured in 60 to 79% range and were within the borderline range.  We will continue to follow in 48-monthintervals.  3. Primary hypertension Continue antihypertensive medications as already ordered, these medications have been reviewed and there are no changes at this time.   4. History of tobacco abuse Smoking cessation was discussed, 3-10 minutes spent on this topic specifically.  Continued smoking will worsen her vascular diseases.   Current Outpatient Medications on File Prior to Visit  Medication Sig Dispense  Refill   acetaminophen (TYLENOL) 500 MG tablet Take 500 mg by mouth every 6 (six) hours as needed.     albuterol (VENTOLIN HFA) 108 (90 Base) MCG/ACT inhaler Inhale 2 puffs into the lungs every 6 (six) hours as needed for wheezing or shortness of breath. 8 g 0   atenolol (TENORMIN) 25 MG tablet Take 1 tablet (25 mg total) by mouth daily. 90 tablet 1   B-COMPLEX-C PO Take 1 tablet by mouth daily.     benazepril (LOTENSIN) 10 MG tablet Take 1 tablet (10 mg total) by mouth daily. 90 tablet 1   cilostazol (PLETAL) 100 MG tablet Take 1 tablet (100 mg total) by mouth 2 (two) times daily. 180 tablet 1   cyclobenzaprine (FLEXERIL) 5 MG tablet Take 5 mg by mouth 3 (three) times daily as needed for muscle spasms.      diphenhydrAMINE (BENADRYL) 25 MG tablet Take 25 mg by mouth every 6 (six) hours as needed.      escitalopram (LEXAPRO) 5 MG tablet 1/2 tab daily for 1 week, then increase to 1 tab daily 30 tablet 3   fluconazole (DIFLUCAN) 150 MG tablet Take 1 tablet (150 mg total) by mouth daily. 1 tablet 0   fluticasone (FLONASE) 50 MCG/ACT nasal spray 2 sprays into each nostril in the morning. Up to three sprays.     Fluticasone-Umeclidin-Vilant (TRELEGY ELLIPTA) 100-62.5-25 MCG/INH AEPB Inhale 1 puff into the lungs daily. 3 each 3   gabapentin (NEURONTIN) 300 MG capsule Take 1 capsule (300 mg total) by mouth 2 (two) times daily. 180 capsule 6   hydrOXYzine (ATARAX/VISTARIL) 25 MG tablet Take 1 tablet (25 mg total) by mouth 3 (three) times daily as  needed. 270 tablet 3   lidocaine (LIDODERM) 5 % Place 1 patch onto the skin daily. Remove & Discard patch within 12 hours or as directed by MD 60 patch 6   Misc Natural Products (JOINT HEALTH PO) Take by mouth daily.     montelukast (SINGULAIR) 10 MG tablet Take 1 tablet (10 mg total) by mouth at bedtime. 90 tablet 3   Multiple Vitamins-Minerals (MULTIVITAMIN WITH MINERALS) tablet Take 1 tablet by mouth daily.     NIACINAMIDE PO Take 1 capsule by mouth daily.      omeprazole (PRILOSEC) 20 MG capsule Take 1 capsule (20 mg total) by mouth daily. 90 capsule 3   UNABLE TO FIND Take 1 tablet by mouth 2 (two) times daily. Cardio FX     UNABLE TO FIND Take 1 tablet by mouth daily. Cinnergy      DUREZOL 0.05 % EMUL Place into the right eye every morning.  (Patient not taking: No sig reported)     No current facility-administered medications on file prior to visit.    There are no Patient Instructions on file for this visit. No follow-ups on file.   Kris Hartmann, NP

## 2021-04-27 DIAGNOSIS — E440 Moderate protein-calorie malnutrition: Secondary | ICD-10-CM | POA: Diagnosis not present

## 2021-04-27 DIAGNOSIS — J471 Bronchiectasis with (acute) exacerbation: Secondary | ICD-10-CM | POA: Diagnosis not present

## 2021-04-27 DIAGNOSIS — I10 Essential (primary) hypertension: Secondary | ICD-10-CM | POA: Diagnosis not present

## 2021-04-27 DIAGNOSIS — J189 Pneumonia, unspecified organism: Secondary | ICD-10-CM | POA: Diagnosis not present

## 2021-04-27 DIAGNOSIS — E876 Hypokalemia: Secondary | ICD-10-CM | POA: Diagnosis not present

## 2021-04-27 DIAGNOSIS — J440 Chronic obstructive pulmonary disease with acute lower respiratory infection: Secondary | ICD-10-CM | POA: Diagnosis not present

## 2021-04-29 DIAGNOSIS — J440 Chronic obstructive pulmonary disease with acute lower respiratory infection: Secondary | ICD-10-CM | POA: Diagnosis not present

## 2021-04-29 DIAGNOSIS — E440 Moderate protein-calorie malnutrition: Secondary | ICD-10-CM | POA: Diagnosis not present

## 2021-04-29 DIAGNOSIS — J471 Bronchiectasis with (acute) exacerbation: Secondary | ICD-10-CM | POA: Diagnosis not present

## 2021-04-29 DIAGNOSIS — E876 Hypokalemia: Secondary | ICD-10-CM | POA: Diagnosis not present

## 2021-04-29 DIAGNOSIS — J189 Pneumonia, unspecified organism: Secondary | ICD-10-CM | POA: Diagnosis not present

## 2021-04-29 DIAGNOSIS — I10 Essential (primary) hypertension: Secondary | ICD-10-CM | POA: Diagnosis not present

## 2021-05-05 DIAGNOSIS — E876 Hypokalemia: Secondary | ICD-10-CM | POA: Diagnosis not present

## 2021-05-05 DIAGNOSIS — J189 Pneumonia, unspecified organism: Secondary | ICD-10-CM | POA: Diagnosis not present

## 2021-05-05 DIAGNOSIS — J440 Chronic obstructive pulmonary disease with acute lower respiratory infection: Secondary | ICD-10-CM | POA: Diagnosis not present

## 2021-05-05 DIAGNOSIS — J471 Bronchiectasis with (acute) exacerbation: Secondary | ICD-10-CM | POA: Diagnosis not present

## 2021-05-05 DIAGNOSIS — I10 Essential (primary) hypertension: Secondary | ICD-10-CM | POA: Diagnosis not present

## 2021-05-05 DIAGNOSIS — E440 Moderate protein-calorie malnutrition: Secondary | ICD-10-CM | POA: Diagnosis not present

## 2021-05-11 ENCOUNTER — Ambulatory Visit: Payer: Medicare Other

## 2021-05-13 ENCOUNTER — Telehealth: Payer: Self-pay | Admitting: Family Medicine

## 2021-05-13 NOTE — Telephone Encounter (Signed)
Copied from [place] [id number]. Topic: Medicare AWV >> May 13, 2021  3:45 PM Lavonia Drafts wrote: Reason for CRM: N/A, unable to leave a message for patient to call back and schedule the Medicare Annual Wellness Visit (AWV) virtually or by telephone.  Last AWV 03/09/20  Please schedule at anytime with [facility]Nurse Health Advisor.  45 minute appointment  Any questions, please call me at [phone]

## 2021-05-18 ENCOUNTER — Ambulatory Visit: Payer: Medicare Other | Admitting: Family Medicine

## 2021-05-19 ENCOUNTER — Other Ambulatory Visit: Payer: Self-pay

## 2021-05-19 ENCOUNTER — Ambulatory Visit
Admission: RE | Admit: 2021-05-19 | Discharge: 2021-05-19 | Disposition: A | Discharge status: Home / Self Care | Payer: Medicare Other | Source: Ambulatory Visit | Attending: Pulmonary Disease | Admitting: Pulmonary Disease

## 2021-05-19 DIAGNOSIS — J479 Bronchiectasis, uncomplicated: Secondary | ICD-10-CM | POA: Diagnosis not present

## 2021-05-19 DIAGNOSIS — J439 Emphysema, unspecified: Secondary | ICD-10-CM | POA: Diagnosis not present

## 2021-05-19 DIAGNOSIS — R911 Solitary pulmonary nodule: Secondary | ICD-10-CM | POA: Diagnosis not present

## 2021-05-19 DIAGNOSIS — R918 Other nonspecific abnormal finding of lung field: Secondary | ICD-10-CM | POA: Diagnosis not present

## 2021-05-19 DIAGNOSIS — I700 Atherosclerosis of aorta: Secondary | ICD-10-CM | POA: Diagnosis not present

## 2021-05-21 ENCOUNTER — Ambulatory Visit: Payer: Medicare Other | Admitting: Family Medicine

## 2021-05-26 ENCOUNTER — Ambulatory Visit: Payer: Medicare Other | Admitting: Family Medicine

## 2021-05-27 ENCOUNTER — Ambulatory Visit (INDEPENDENT_AMBULATORY_CARE_PROVIDER_SITE_OTHER): Payer: Medicare Other | Admitting: Family Medicine

## 2021-05-27 ENCOUNTER — Other Ambulatory Visit: Payer: Self-pay

## 2021-05-27 ENCOUNTER — Encounter: Payer: Self-pay | Admitting: Family Medicine

## 2021-05-27 VITALS — BP 123/76 | HR 105 | Ht 60.0 in | Wt 109.0 lb

## 2021-05-27 DIAGNOSIS — Z23 Encounter for immunization: Secondary | ICD-10-CM | POA: Diagnosis not present

## 2021-05-27 DIAGNOSIS — D692 Other nonthrombocytopenic purpura: Secondary | ICD-10-CM | POA: Diagnosis not present

## 2021-05-27 DIAGNOSIS — I25118 Atherosclerotic heart disease of native coronary artery with other forms of angina pectoris: Secondary | ICD-10-CM

## 2021-05-27 DIAGNOSIS — I10 Essential (primary) hypertension: Secondary | ICD-10-CM

## 2021-05-27 DIAGNOSIS — I6523 Occlusion and stenosis of bilateral carotid arteries: Secondary | ICD-10-CM | POA: Diagnosis not present

## 2021-05-27 MED ORDER — OMEPRAZOLE 20 MG PO CPDR: 20.0000 mg | DELAYED_RELEASE_CAPSULE | Freq: Every day | ORAL | 3 refills | Status: DC

## 2021-05-27 MED ORDER — ESCITALOPRAM OXALATE 5 MG PO TABS: ORAL_TABLET | ORAL | 3 refills | Status: DC

## 2021-05-27 NOTE — Assessment & Plan Note (Signed)
Under good control on current regimen. Continue current regimen. Continue to monitor. Call with any concerns. Refills given.   

## 2021-05-27 NOTE — Assessment & Plan Note (Signed)
Reassured patient. Continue to monitor.  

## 2021-05-27 NOTE — Assessment & Plan Note (Signed)
Will keep BP and cholesterol under good control. Continue to monitor. Call with any concerns.  

## 2021-05-27 NOTE — Progress Notes (Signed)
BP 123/76   Pulse (!) 105   Ht 5' (1.524 m)   Wt 109 lb (49.4 kg)   BMI 21.29 kg/m    Subjective:    Patient ID: Savannah Mcclure, female    DOB: 04-May-1941, 80 y.o.   MRN: [id number]  HPI: Savannah Mcclure is a 80 y.o. female  Chief Complaint  Patient presents with   Hypertension   ANXIETY/DEPRESSION Duration: chronic Status:better Anxious mood: no  Excessive worrying: no Irritability: no  Sweating: no Nausea: no Palpitations:no Hyperventilation: no Panic attacks: no Agoraphobia: no  Obscessions/compulsions: no Depressed mood: no Depression screen [facility] 2/9 05/27/2021 04/20/2021 12/23/2020 09/10/2020 03/09/2020  Decreased Interest 1 0 0 0 2  Down, Depressed, Hopeless 0 0 0 0 2  PHQ - 2 Score 1 0 0 0 4  Altered sleeping 1 - - 0 0  Tired, decreased energy 0 - - 0 1  Change in appetite 0 - - 0 1  Feeling bad or failure about yourself  0 - - 0 0  Trouble concentrating 0 - - 0 0  Moving slowly or fidgety/restless 0 - - 0 0  Suicidal thoughts 0 - - 0 0  PHQ-9 Score 2 - - 0 6  Difficult doing work/chores Not difficult at all - - - Somewhat difficult  Some recent data might be hidden   Anhedonia: no Weight changes: no Insomnia: no   Hypersomnia: no Fatigue/loss of energy: yes Feelings of worthlessness: no Feelings of guilt: no Impaired concentration/indecisiveness: no Suicidal ideations: no  Crying spells: no Recent Stressors/Life Changes: yes   Relationship problems: no   Family stress: no     Financial stress: no    Job stress: no    Recent death/loss: no  HYPERTENSION Hypertension status: controlled  Satisfied with current treatment? yes Duration of hypertension: chronic BP monitoring frequency:  not checking BP medication side effects:  no Medication compliance: excellent compliance Previous BP meds:atenolol, benazepril Aspirin: no Recurrent headaches: no Visual changes: no Palpitations: no Dyspnea: no Chest pain: no Lower  extremity edema: no Dizzy/lightheaded: no  Relevant past medical, surgical, family and social history reviewed and updated as indicated. Interim medical history since our last visit reviewed. Allergies and medications reviewed and updated.  Review of Systems  Constitutional: Negative.   Respiratory: Negative.    Cardiovascular: Negative.   Gastrointestinal: Negative.   Musculoskeletal: Negative.   Psychiatric/Behavioral: Negative.     Per HPI unless specifically indicated above     Objective:    BP 123/76   Pulse (!) 105   Ht 5' (1.524 m)   Wt 109 lb (49.4 kg)   BMI 21.29 kg/m   Wt Readings from Last 3 Encounters:  05/27/21 109 lb (49.4 kg)  04/21/21 109 lb 12.8 oz (49.8 kg)  04/20/21 109 lb 12.8 oz (49.8 kg)    Physical Exam Vitals and nursing note reviewed.  Constitutional:      General: She is not in acute distress.    Appearance: Normal appearance. She is not ill-appearing, toxic-appearing or diaphoretic.  HENT:     Head: Normocephalic and atraumatic.     Right Ear: External ear normal.     Left Ear: External ear normal.     Nose: Nose normal.     Mouth/Throat:     Mouth: Mucous membranes are moist.     Pharynx: Oropharynx is clear.  Eyes:     General: No scleral icterus.       Right eye: No  discharge.        Left eye: No discharge.     Extraocular Movements: Extraocular movements intact.     Conjunctiva/sclera: Conjunctivae normal.     Pupils: Pupils are equal, round, and reactive to light.  Cardiovascular:     Rate and Rhythm: Normal rate and regular rhythm.     Pulses: Normal pulses.     Heart sounds: Normal heart sounds. No murmur heard.   No friction rub. No gallop.  Pulmonary:     Effort: Pulmonary effort is normal. No respiratory distress.     Breath sounds: Normal breath sounds. No stridor. No wheezing, rhonchi or rales.  Chest:     Chest wall: No tenderness.  Musculoskeletal:        General: Normal range of motion.     Cervical back: Normal  range of motion and neck supple.  Skin:    General: Skin is warm and dry.     Capillary Refill: Capillary refill takes less than 2 seconds.     Coloration: Skin is not jaundiced or pale.     Findings: No bruising, erythema, lesion or rash.  Neurological:     General: No focal deficit present.     Mental Status: She is alert and oriented to person, place, and time. Mental status is at baseline.  Psychiatric:        Mood and Affect: Mood normal.        Behavior: Behavior normal.        Thought Content: Thought content normal.        Judgment: Judgment normal.    Results for orders placed or performed in visit on 04/20/21  CBC with Differential/Platelet  Result Value Ref Range   WBC 7.3 3.4 - 10.8 x10E3/uL   RBC 4.42 3.77 - 5.28 x10E6/uL   Hemoglobin 13.5 11.1 - 15.9 g/dL   Hematocrit 40.5 34.0 - 46.6 %   MCV 92 79 - 97 fL   MCH 30.5 26.6 - 33.0 pg   MCHC 33.3 31.5 - 35.7 g/dL   RDW 13.1 11.7 - 15.4 %   Platelets 450 150 - 450 x10E3/uL   Neutrophils 58 Not Estab. %   Lymphs 33 Not Estab. %   Monocytes 6 Not Estab. %   Eos 2 Not Estab. %   Basos 1 Not Estab. %   Neutrophils Absolute 4.2 1.4 - 7.0 x10E3/uL   Lymphocytes Absolute 2.4 0.7 - 3.1 x10E3/uL   Monocytes Absolute 0.4 0.1 - 0.9 x10E3/uL   EOS (ABSOLUTE) 0.2 0.0 - 0.4 x10E3/uL   Basophils Absolute 0.1 0.0 - 0.2 x10E3/uL   Immature Granulocytes 0 Not Estab. %   Immature Grans (Abs) 0.0 0.0 - 0.1 x10E3/uL  Comprehensive metabolic panel  Result Value Ref Range   Glucose 82 65 - 99 mg/dL   BUN 12 8 - 27 mg/dL   Creatinine, Ser 0.76 0.57 - 1.00 mg/dL   eGFR 79 >59 mL/min/1.73   BUN/Creatinine Ratio 16 12 - 28   Sodium 141 134 - 144 mmol/L   Potassium 4.3 3.5 - 5.2 mmol/L   Chloride 101 96 - 106 mmol/L   CO2 26 20 - 29 mmol/L   Calcium 9.6 8.7 - 10.3 mg/dL   Total Protein 6.9 6.0 - 8.5 g/dL   Albumin 4.7 3.7 - 4.7 g/dL   Globulin, Total 2.2 1.5 - 4.5 g/dL   Albumin/Globulin Ratio 2.1 1.2 - 2.2   Bilirubin Total 0.3  0.0 - 1.2 mg/dL   Alkaline  Phosphatase 75 44 - 121 IU/L   AST 18 0 - 40 IU/L   ALT 11 0 - 32 IU/L  Lipid Panel w/o Chol/HDL Ratio  Result Value Ref Range   Cholesterol, Total 176 100 - 199 mg/dL   Triglycerides 157 (H) 0 - 149 mg/dL   HDL 48 >39 mg/dL   VLDL Cholesterol Cal 27 5 - 40 mg/dL   LDL Chol Calc (NIH) 101 (H) 0 - 99 mg/dL  TSH  Result Value Ref Range   TSH 5.920 (H) 0.450 - 4.500 uIU/mL      Assessment & Plan:   Problem List Items Addressed This Visit       Cardiovascular and Mediastinum   HTN (hypertension)    Under good control on current regimen. Continue current regimen. Continue to monitor. Call with any concerns. Refills given.        Senile purpura ([place])    Reassured patient. Continue to monitor.       Coronary artery disease of native artery of native heart with stable angina pectoris ([place])    Will keep BP and cholesterol under good control. Continue to monitor. Call with any concerns.       Relevant Medications   escitalopram (LEXAPRO) 5 MG tablet   Other Visit Diagnoses     Needs flu shot    -  Primary   Relevant Orders   Flu Vaccine QUAD High Dose(Fluad) (Completed)        Follow up plan: Return March please.

## 2021-06-08 DIAGNOSIS — Z23 Encounter for immunization: Secondary | ICD-10-CM | POA: Diagnosis not present

## 2021-06-15 ENCOUNTER — Ambulatory Visit
Payer: Medicare Other | Attending: Student in an Organized Health Care Education/Training Program | Admitting: Student in an Organized Health Care Education/Training Program

## 2021-06-15 ENCOUNTER — Encounter: Payer: Self-pay | Admitting: Student in an Organized Health Care Education/Training Program

## 2021-06-15 ENCOUNTER — Other Ambulatory Visit: Payer: Self-pay

## 2021-06-15 ENCOUNTER — Other Ambulatory Visit: Payer: Self-pay | Admitting: Family Medicine

## 2021-06-15 DIAGNOSIS — M48062 Spinal stenosis, lumbar region with neurogenic claudication: Secondary | ICD-10-CM

## 2021-06-15 DIAGNOSIS — M47816 Spondylosis without myelopathy or radiculopathy, lumbar region: Secondary | ICD-10-CM

## 2021-06-15 DIAGNOSIS — G894 Chronic pain syndrome: Secondary | ICD-10-CM

## 2021-06-15 DIAGNOSIS — F1190 Opioid use, unspecified, uncomplicated: Secondary | ICD-10-CM

## 2021-06-15 DIAGNOSIS — M5136 Other intervertebral disc degeneration, lumbar region: Secondary | ICD-10-CM

## 2021-06-15 DIAGNOSIS — M47814 Spondylosis without myelopathy or radiculopathy, thoracic region: Secondary | ICD-10-CM

## 2021-06-15 DIAGNOSIS — M4120 Other idiopathic scoliosis, site unspecified: Secondary | ICD-10-CM

## 2021-06-15 NOTE — Progress Notes (Signed)
I attempted to call the patient however no response. Voicemail left instructing patient to call front desk office at [phone] to reschedule appointment. -Dr Nashla Althoff  

## 2021-06-15 NOTE — Telephone Encounter (Signed)
Medication Refill - Medication: 90 day supply montelukast (SINGULAIR) 10 MG tablet, thyroid (ARMOUR THYROID) 60 MG tablet, thyroid (ARMOUR THYROID) 90 MG tablet  Has the patient contacted their pharmacy? No. Patient states in the past PCP sent in a 15 day supply for each THYROID medication and she needs a 90 day supply for all 3 medications.   Preferred Pharmacy (with phone number or street name):  [place], [place] Phone:  [phone]  Fax:  [phone]      Has the patient been seen for an appointment in the last year OR does the patient have an upcoming appointment? Yes.    Agent: Please be advised that RX refills may take up to 3 business days. We ask that you follow-up with your pharmacy.

## 2021-06-15 NOTE — Telephone Encounter (Signed)
Requested medication (s) are due for refill today - expired Rx  Requested medication (s) are on the active medication list -yes  Future visit scheduled -yes  Last refill: 10/24/19 #90 3RF  Notes to clinic: Request RF: expired Rx  Requested Prescriptions  Pending Prescriptions Disp Refills   montelukast (SINGULAIR) 10 MG tablet 90 tablet 3    Sig: Take 1 tablet (10 mg total) by mouth at bedtime.     Pulmonology:  Leukotriene Inhibitors Passed - 06/15/2021  3:48 PM      Passed - Valid encounter within last 12 months    Recent Outpatient Visits           2 weeks ago Needs flu shot   [place], [place], DO   1 month ago Primary hypertension   [place], Megan P, DO   3 months ago Bronchiectasis without complication [facility])   [place], Megan P, DO   3 months ago Sore throat   [place], Megan P, DO   7 months ago Pneumonia of both lower lobes due to infectious organism   [place], [place], DO       Future Appointments             In 4 months [place], Megan P, DO [place], [facility]            Refused Prescriptions Disp Refills   thyroid (ARMOUR THYROID) 60 MG tablet 45 tablet 1    Sig: Take 1 tablet (60 mg total) by mouth every other day. To be alternated with the 90mg  tab     Endocrinology:  Hypothyroid Agents Failed - 06/15/2021  3:48 PM      Failed - TSH needs to be rechecked within 3 months after an abnormal result. Refill until TSH is due.      Failed - TSH in normal range and within 360 days    TSH  Date Value Ref Range Status  04/20/2021 5.920 (H) 0.450 - 4.500 uIU/mL Final          Passed - Valid encounter within last 12 months    Recent Outpatient Visits           2 weeks ago Needs flu shot   [place], [place], DO   1 month ago Primary hypertension   [place], Megan P, DO   3  months ago Bronchiectasis without complication [facility])   [place], Megan P, DO   3 months ago Sore throat   [place], Megan P, DO   7 months ago Pneumonia of both lower lobes due to infectious organism   [place], [place], DO       Future Appointments             In 4 months Johnson, Megan P, DO [place], [facility]             thyroid (ARMOUR THYROID) 90 MG tablet 45 tablet 1    Sig: Take 1 tablet (90 mg total) by mouth every other day. To be alternated with the 60mg  tab     Endocrinology:  Hypothyroid Agents Failed - 06/15/2021  3:48 PM      Failed - TSH needs to be rechecked within 3 months after an abnormal result. Refill until TSH is due.      Failed - TSH in normal range  and within 360 days    TSH  Date Value Ref Range Status  04/20/2021 5.920 (H) 0.450 - 4.500 uIU/mL Final          Passed - Valid encounter within last 12 months    Recent Outpatient Visits           2 weeks ago Needs flu shot   [place], [place], DO   1 month ago Primary hypertension   [place], Megan P, DO   3 months ago Bronchiectasis without complication [facility])   [place], Megan P, DO   3 months ago Sore throat   [place], Megan P, DO   7 months ago Pneumonia of both lower lobes due to infectious organism   [place], [place], DO       Future Appointments             In 4 months Johnson, Megan P, DO [place], [facility]               Requested Prescriptions  Pending Prescriptions Disp Refills   montelukast (SINGULAIR) 10 MG tablet 90 tablet 3    Sig: Take 1 tablet (10 mg total) by mouth at bedtime.     Pulmonology:  Leukotriene Inhibitors Passed - 06/15/2021  3:48 PM      Passed - Valid encounter within last 12 months    Recent Outpatient Visits           2 weeks ago  Needs flu shot   [place], [place], DO   1 month ago Primary hypertension   [place], Megan P, DO   3 months ago Bronchiectasis without complication [facility])   [place], Megan P, DO   3 months ago Sore throat   [place], Megan P, DO   7 months ago Pneumonia of both lower lobes due to infectious organism   [place], [place], DO       Future Appointments             In 4 months Johnson, Megan P, DO [place], [facility]            Refused Prescriptions Disp Refills   thyroid (ARMOUR THYROID) 60 MG tablet 45 tablet 1    Sig: Take 1 tablet (60 mg total) by mouth every other day. To be alternated with the 90mg  tab     Endocrinology:  Hypothyroid Agents Failed - 06/15/2021  3:48 PM      Failed - TSH needs to be rechecked within 3 months after an abnormal result. Refill until TSH is due.      Failed - TSH in normal range and within 360 days    TSH  Date Value Ref Range Status  04/20/2021 5.920 (H) 0.450 - 4.500 uIU/mL Final          Passed - Valid encounter within last 12 months    Recent Outpatient Visits           2 weeks ago Needs flu shot   [place], [place], DO   1 month ago Primary hypertension   [place], Megan P, DO   3 months ago Bronchiectasis without complication [facility])   [place], Megan P, DO   3 months ago Sore throat   [place], [place],  DO   7 months ago Pneumonia of both lower lobes due to infectious organism   [place], [place], DO       Future Appointments             In 4 months Johnson, Megan P, DO [facility], [facility]             thyroid (ARMOUR THYROID) 90 MG tablet 45 tablet 1    Sig: Take 1 tablet (90 mg total) by mouth every other day. To be alternated with the 60mg  tab      Endocrinology:  Hypothyroid Agents Failed - 06/15/2021  3:48 PM      Failed - TSH needs to be rechecked within 3 months after an abnormal result. Refill until TSH is due.      Failed - TSH in normal range and within 360 days    TSH  Date Value Ref Range Status  04/20/2021 5.920 (H) 0.450 - 4.500 uIU/mL Final          Passed - Valid encounter within last 12 months    Recent Outpatient Visits           2 weeks ago Needs flu shot   [place], [place], DO   1 month ago Primary hypertension   [place], Megan P, DO   3 months ago Bronchiectasis without complication [facility])   [place], Megan P, DO   3 months ago Sore throat   [place], Megan P, DO   7 months ago Pneumonia of both lower lobes due to infectious organism   [place], Barb Merino, DO       Future Appointments             In 4 months Wynetta Emery, Barb Merino, DO [employer], [facility]

## 2021-06-15 NOTE — Telephone Encounter (Signed)
Call to pharmacy- they are filling monthly supply because her insurance will not pay for 90 day as it was sent in. Call to patient- notified - she states the mail order was the same way.

## 2021-06-16 ENCOUNTER — Ambulatory Visit
Payer: Medicare Other | Attending: Student in an Organized Health Care Education/Training Program | Admitting: Student in an Organized Health Care Education/Training Program

## 2021-06-16 ENCOUNTER — Encounter: Payer: Self-pay | Admitting: Student in an Organized Health Care Education/Training Program

## 2021-06-16 DIAGNOSIS — M4120 Other idiopathic scoliosis, site unspecified: Secondary | ICD-10-CM

## 2021-06-16 DIAGNOSIS — M47814 Spondylosis without myelopathy or radiculopathy, thoracic region: Secondary | ICD-10-CM

## 2021-06-16 DIAGNOSIS — G894 Chronic pain syndrome: Secondary | ICD-10-CM | POA: Diagnosis not present

## 2021-06-16 DIAGNOSIS — M47816 Spondylosis without myelopathy or radiculopathy, lumbar region: Secondary | ICD-10-CM

## 2021-06-16 DIAGNOSIS — M48062 Spinal stenosis, lumbar region with neurogenic claudication: Secondary | ICD-10-CM | POA: Diagnosis not present

## 2021-06-16 DIAGNOSIS — M5136 Other intervertebral disc degeneration, lumbar region: Secondary | ICD-10-CM | POA: Diagnosis not present

## 2021-06-16 MED ORDER — MONTELUKAST SODIUM 10 MG PO TABS: 10.0000 mg | ORAL_TABLET | Freq: Every day | ORAL | 3 refills | Status: DC

## 2021-06-16 MED ORDER — TRAMADOL HCL 50 MG PO TABS: 50.0000 mg | ORAL_TABLET | Freq: Four times a day (QID) | ORAL | 1 refills | Status: DC | PRN

## 2021-06-16 NOTE — Progress Notes (Addendum)
Patient: Savannah Mcclure  Service Category: E/M  Provider: Gillis Santa, MD  DOB: 01-Jan-1941  DOS: 06/16/2021  Location: Office  MRN: [id number]  Setting: Ambulatory outpatient  Referring Provider: [name], DO  Type: Established Patient  Specialty: Interventional Pain Management  PCP: [name], DO  Location: Home  Delivery: TeleHealth     Virtual Encounter - Pain Management PROVIDER NOTE: Information contained herein reflects review and annotations entered in association with encounter. Interpretation of such information and data should be left to medically-trained personnel. Information provided to patient can be located elsewhere in the medical record under "Patient Instructions". Document created using STT-dictation technology, any transcriptional errors that may result from process are unintentional.    Contact & Pharmacy Preferred: [phone] Home: [phone] (home) Mobile: [phone] (mobile) E-mail: [email or web address]  [facility] - [address], [place] [address] Phone: [phone] Fax: [place], [place] [address] Phone: [phone] Fax: [place], [place] [address] [place] [address] Phone: [phone] Fax: [phone]   Pre-screening  Savannah Mcclure offered "in-person" vs "virtual" encounter. She indicated preferring virtual for this encounter.   Reason COVID-19*  Social distancing based on CDC and AMA recommendations.   I contacted [place] on 06/16/2021 via telephone.      I clearly identified myself as Gillis Santa, MD. I verified that I was speaking with the correct person using two identifiers (Name: Savannah Mcclure, and date of birth: [birth year]).  Consent I sought verbal  advanced consent from [place] for virtual visit interactions. I informed Savannah Mcclure of possible security and privacy concerns, risks, and limitations associated with providing "not-in-person" medical evaluation and management services. I also informed Savannah Mcclure of the availability of "in-person" appointments. Finally, I informed her that there would be a charge for the virtual visit and that she could be  personally, fully or partially, financially responsible for it. Savannah Mcclure expressed understanding and agreed to proceed.   Historic Elements   Savannah Mcclure is a 80 y.o. year old, female patient evaluated today after our last contact on 06/15/2021. Savannah Mcclure  has a past medical history of Abdominal distension (03/18/2012), Abnormal weight loss (10/30/2017), Acute kidney injury ([place]) (03/22/2012), Aneurysm ([place]), Anxiety, Blind right eye, Cardiac arrest ([place]), Cataract, Claudication ([place]), Complication of anesthesia (03/15/2012), COPD (chronic obstructive pulmonary disease) ([place]), Delirium (03/18/2012), Dyspnea, GERD (gastroesophageal reflux disease), Heart murmur, Hypertension, Leaky heart valve, Oxygen deficit, Peripheral vascular disease ([place]), Scoliosis, Sepsis ([place]), Shock ([place]) (03/23/2012), SIRS (systemic inflammatory response syndrome) ([place]) (03/23/2012), Spinal stenosis, Stroke ([place]), and Tachycardia. She also  has a past surgical history that includes right eye surgery; Wisdom tooth extraction; Back surgery; Abdominal hysterectomy; Breast lumpectomy (Left); Lower Extremity Angiography (Right, 06/20/2018); Cataract extraction w/PHACO (Left, 08/14/2019); and Lower Extremity Angiography (Left, 02/03/2020). Savannah Mcclure has a current medication list which includes the following prescription(s): acetaminophen, albuterol, atenolol, b complex-c, benazepril, cilostazol, cyclobenzaprine, diphenhydramine, durezol, escitalopram, fluticasone, trelegy ellipta,  gabapentin, hydroxyzine, lidocaine, misc natural products, montelukast, multivitamin with minerals, niacinamide, omeprazole, thyroid, thyroid, tramadol, UNABLE TO FIND, and UNABLE TO FIND. She  reports that she has been smoking cigarettes. She has a 40.00 pack-year smoking history. She has never used smokeless tobacco. She reports current alcohol  use. She reports that she does not use drugs. Savannah Mcclure is allergic to tramadol, other, oxycodone, amlodipine, augmentin [amoxicillin-pot clavulanate], azithromycin, contrast media [iodinated diagnostic agents], doxycycline, erythromycin, hydrocodone, lactose intolerance (gi), minocycline, nsaids, relafen [nabumetone], sulfa antibiotics, synthroid [levothyroxine], tetracyclines & related, and tilactase.   HPI  Today, she is being contacted for medication management.  No change in medical history since last visit.  Patient's pain is at baseline.  Patient continues multimodal pain regimen as prescribed.  States that it provides pain relief and improvement in functional status.   Pharmacotherapy Assessment   Analgesic: Tramadol 50 mg QID prn   Monitoring: [place] PMP: PDMP reviewed during this encounter.       Pharmacotherapy: No side-effects or adverse reactions reported. Compliance: No problems identified. Effectiveness: Clinically acceptable. Plan: Refer to "POC". UDS:  Summary  Date Value Ref Range Status  04/24/2019 Note  Final    Comment:    ==================================================================== [employer] Select 13 (MW) ==================================================================== Test                             Result       Flag       Units Drug Absent but Declared for Prescription Verification   Tramadol                       Not Detected UNEXPECTED ng/mg creat ==================================================================== Test                      Result    Flag   Units      Ref Range   Creatinine               53               mg/dL      >=20 ==================================================================== Declared Medications:  The flagging and interpretation on this report are based on the  following declared medications.  Unexpected results may arise from  inaccuracies in the declared medications.  **Note: The testing scope of this panel includes these medications:  Tramadol  **Note: The testing scope of this panel does not include the  following reported medications:  Albuterol (Duoneb)  Benazepril  Cefuroxime  Cilostazol (Pletal)  Cyclobenzaprine  Gabapentin  Hydroxyzine  Ipratropium (Duoneb)  Magnesium  Montelukast  Multivitamin  Niacinamide  Omeprazole  Potassium  Supplement  Tiotropium  Topical Lidocaine  Vitamin B ==================================================================== For clinical consultation, please call [phone]. ====================================================================      Laboratory Chemistry Profile   Renal Lab Results  Component Value Date   BUN 12 04/20/2021   CREATININE 0.76 04/20/2021   BCR 16 04/20/2021   GFRAA 90 09/10/2020   GFRNONAA 78 09/10/2020    Hepatic Lab Results  Component Value Date   AST 18 04/20/2021   ALT 11 04/20/2021   ALBUMIN 4.7 04/20/2021   ALKPHOS 75 04/20/2021    Electrolytes Lab Results  Component Value Date   NA 141 04/20/2021   K 4.3 04/20/2021   CL 101 04/20/2021   CALCIUM 9.6 04/20/2021   MG 1.7 03/15/2021   PHOS 3.2 10/13/2017    Bone No results found for: VD25OH, VD125OH2TOT, FX9024OX7, DZ3299ME2, 25OHVITD1, 25OHVITD2, 25OHVITD3, TESTOFREE, TESTOSTERONE  Inflammation (CRP: Acute Phase) (ESR: Chronic Phase) Lab Results  Component Value Date   LATICACIDVEN 1.8 10/09/2017         Note: Above Lab results reviewed.  Imaging  CT CHEST  WO CONTRAST CLINICAL DATA:  Lung nodule follow-up  EXAM: CT CHEST WITHOUT CONTRAST  TECHNIQUE: Multidetector CT imaging of the chest  was performed following the standard protocol without IV contrast.  COMPARISON:  CT chest dated November 04, 2020  FINDINGS: Cardiovascular: Normal heart size. No pericardial effusion. Three-vessel coronary artery calcifications. Mitral annular calcifications. Atherosclerotic disease of the thoracic aorta.  Mediastinum/Nodes: Small hiatal hernia. Normal thyroid. Mildly enlarged right axillary lymph node, measuring up to 1.1 cm on series 2, image 36, unchanged compared to prior and likely reactive. No pathologically enlarged lymph nodes in the chest.  Lungs/Pleura: Debris noted in the posterior trachea. Central airways are patent. Centrilobular emphysema. Stable solid pulmonary nodule of the right upper lobe measuring 8 mm in mean diameter. Decreased clustered nodularity of the posterior right upper lobe, right middle lobe and right lower lobe. Right middle lobe and lingular bronchiectasis with associated linear opacities and subpleural irregular linear opacities of the right upper lung, unchanged compared to prior and likely sequela of prior infection. No new parenchymal findings.  Upper Abdomen: No acute abnormality.  Musculoskeletal: No chest wall mass or suspicious bone lesions identified.  IMPRESSION: Decreased areas of clustered nodularity, findings are compatible with improved chronic atypical infection.  Aortic Atherosclerosis (ICD10-I70.0) and Emphysema (ICD10-J43.9).  Electronically Signed   By: Yetta Glassman M.D.   On: 05/21/2021 08:33  Assessment  The primary encounter diagnosis was Chronic pain syndrome. Diagnoses of Idiopathic scoliosis and kyphoscoliosis, Thoracic spondylosis without myelopathy, Spondylosis without myelopathy or radiculopathy, lumbar region, Lumbar degenerative disc disease, and Spinal stenosis of lumbar region with neurogenic claudication were also pertinent to this visit.  Plan of Care    [name] has a current  medication list which includes the following long-term medication(s): albuterol, atenolol, benazepril, escitalopram, gabapentin, montelukast, omeprazole, thyroid, and thyroid.  Pharmacotherapy (Medications Ordered): Meds ordered this encounter  Medications   DISCONTD: traMADol (ULTRAM) 50 MG tablet    Sig: Take 1 tablet (50 mg total) by mouth every 6 (six) hours as needed for severe pain.    Dispense:  120 tablet    Refill:  1    Fill one day early if pharmacy is closed on scheduled refill date. Do not fill until:  To last until:   traMADol (ULTRAM) 50 MG tablet    Sig: Take 1 tablet (50 mg total) by mouth every 6 (six) hours as needed for severe pain.    Dispense:  120 tablet    Refill:  1    Fill one day early if pharmacy is closed on scheduled refill date.    Follow-up plan:   Return if symptoms worsen or fail to improve.     Status post T11, T12, L1 RFA bilaterally on 03/04/2019, 04/2018        Recent Visits Date Type Provider Dept  06/16/21 Office Visit Gillis Santa, MD [facility]  06/15/21 Office Visit Gillis Santa, MD [facility]  Showing recent visits within past 90 days and meeting all other requirements Future Appointments No visits were found meeting these conditions. Showing future appointments within next 90 days and meeting all other requirements I discussed the assessment and treatment plan with the patient. The patient was provided an opportunity to ask questions and all were answered. The patient agreed with the plan and demonstrated an understanding of the instructions.  Patient advised to call back or seek an in-person evaluation if the symptoms or condition worsens.  Duration of encounter: 95mnutes.  Note by: Gillis Santa, MD Date: 06/16/2021; Time: 8:53 AM

## 2021-06-17 MED ORDER — TRAMADOL HCL 50 MG PO TABS: 50.0000 mg | ORAL_TABLET | Freq: Four times a day (QID) | ORAL | 1 refills | Status: AC | PRN

## 2021-06-17 NOTE — Addendum Note (Signed)
Addended by: Gillis Santa on: 06/17/2021 08:54 AM   Modules accepted: Orders

## 2021-07-01 ENCOUNTER — Telehealth: Payer: Self-pay | Admitting: Family Medicine

## 2021-07-01 NOTE — Telephone Encounter (Signed)
She will need to take her to the ER. We do not do any type of involuntary commitment.

## 2021-07-01 NOTE — Telephone Encounter (Signed)
Copied from [place] [id number]. Topic: General - Other >> Jun 30, 2021  4:24 PM Antonieta Iba C wrote: Reason for CRM: Benjamine Mola --[phone] room 4H11 pt's daughter is calling in for assistance. She says that mom needs help. Daughter says that she is currently in the hospital in result of pt. Daughter says that pt has dementia and anxiety. Benjamine Mola says that pt is very angry and is infusive. She would like assistance with getting pt committed.   Benjamine Mola would like a call back to discuss further.

## 2021-07-01 NOTE — Telephone Encounter (Signed)
Called and notified patient of Dr. Durenda Age message.

## 2021-07-09 ENCOUNTER — Ambulatory Visit: Payer: Self-pay | Admitting: *Deleted

## 2021-07-09 NOTE — Telephone Encounter (Signed)
Patient states that she's had, cough with green phlegm, high fever, and night sweats, had negative covid test.  Reason for Disposition  [1] MILD difficulty breathing (e.g., minimal/no SOB at rest, SOB with walking, pulse <100) AND [2] still present when not coughing  Answer Assessment - Initial Assessment Questions 1. ONSET: "When did the cough begin?"      Last week 2. SEVERITY: "How bad is the cough today?"      Coughing so bad she vomits 3. SPUTUM: "Describe the color of your sputum" (none, dry cough; clear, white, yellow, green)     Green thick 4. HEMOPTYSIS: "Are you coughing up any blood?" If so ask: "How much?" (flecks, streaks, tablespoons, etc.)     no 5. DIFFICULTY BREATHING: "Are you having difficulty breathing?" If Yes, ask: "How bad is it?" (e.g., mild, moderate, severe)    - MILD: No SOB at rest, mild SOB with walking, speaks normally in sentences, can lie down, no retractions, pulse < 100.    - MODERATE: SOB at rest, SOB with minimal exertion and prefers to sit, cannot lie down flat, speaks in phrases, mild retractions, audible wheezing, pulse 100-120.    - SEVERE: Very SOB at rest, speaks in single words, struggling to breathe, sitting hunched forward, retractions, pulse > 120      Using inhaler and nasal spray- mild 6. FEVER: "Do you have a fever?" If Yes, ask: "What is your temperature, how was it measured, and when did it start?"     No thermometer- is having symptoms 7. CARDIAC HISTORY: "Do you have any history of heart disease?" (e.g., heart attack, congestive heart failure)      No- angina in past, PAD 8. LUNG HISTORY: "Do you have any history of lung disease?"  (e.g., pulmonary embolus, asthma, emphysema)     COPD 9. PE RISK FACTORS: "Do you have a history of blood clots?" (or: recent major surgery, recent prolonged travel, bedridden)     no 10. OTHER SYMPTOMS: "Do you have any other symptoms?" (e.g., runny nose, wheezing, chest pain)       Fever, runny  nose,weakness 11. PREGNANCY: "Is there any chance you are pregnant?" "When was your last menstrual period?"       na 12. TRAVEL: "Have you traveled out of the country in the last month?" (e.g., travel history, exposures)       Daughter had viral pneumonia  Protocols used: Cough - Acute Productive-A-AH

## 2021-07-09 NOTE — Telephone Encounter (Signed)
Call to patient- complains of cough- thick green mucus, fever, congestion, weakness. Negative COVID test- but patient states her daughter was recently diagnosed with viral pneumonia. Patient advised UC for evaluation.

## 2021-07-10 ENCOUNTER — Emergency Department: Payer: Medicare Other

## 2021-07-10 ENCOUNTER — Inpatient Hospital Stay
Admission: EM | Admit: 2021-07-10 | Discharge: 2021-07-13 | DRG: 871 | Disposition: A | Discharge status: Home Health | Payer: Medicare Other | Attending: Internal Medicine | Admitting: Internal Medicine

## 2021-07-10 ENCOUNTER — Other Ambulatory Visit: Payer: Self-pay

## 2021-07-10 ENCOUNTER — Encounter: Payer: Self-pay | Admitting: Emergency Medicine

## 2021-07-10 DIAGNOSIS — Z8673 Personal history of transient ischemic attack (TIA), and cerebral infarction without residual deficits: Secondary | ICD-10-CM

## 2021-07-10 DIAGNOSIS — D75839 Thrombocytosis, unspecified: Secondary | ICD-10-CM

## 2021-07-10 DIAGNOSIS — G629 Polyneuropathy, unspecified: Secondary | ICD-10-CM | POA: Diagnosis present

## 2021-07-10 DIAGNOSIS — I25118 Atherosclerotic heart disease of native coronary artery with other forms of angina pectoris: Secondary | ICD-10-CM | POA: Diagnosis present

## 2021-07-10 DIAGNOSIS — J984 Other disorders of lung: Secondary | ICD-10-CM | POA: Diagnosis present

## 2021-07-10 DIAGNOSIS — J449 Chronic obstructive pulmonary disease, unspecified: Secondary | ICD-10-CM | POA: Diagnosis not present

## 2021-07-10 DIAGNOSIS — F17210 Nicotine dependence, cigarettes, uncomplicated: Secondary | ICD-10-CM | POA: Diagnosis present

## 2021-07-10 DIAGNOSIS — Z20822 Contact with and (suspected) exposure to covid-19: Secondary | ICD-10-CM | POA: Diagnosis present

## 2021-07-10 DIAGNOSIS — E039 Hypothyroidism, unspecified: Secondary | ICD-10-CM | POA: Diagnosis not present

## 2021-07-10 DIAGNOSIS — J440 Chronic obstructive pulmonary disease with acute lower respiratory infection: Secondary | ICD-10-CM | POA: Diagnosis present

## 2021-07-10 DIAGNOSIS — I2510 Atherosclerotic heart disease of native coronary artery without angina pectoris: Secondary | ICD-10-CM | POA: Diagnosis present

## 2021-07-10 DIAGNOSIS — Z79899 Other long term (current) drug therapy: Secondary | ICD-10-CM | POA: Diagnosis not present

## 2021-07-10 DIAGNOSIS — J441 Chronic obstructive pulmonary disease with (acute) exacerbation: Secondary | ICD-10-CM | POA: Diagnosis present

## 2021-07-10 DIAGNOSIS — I10 Essential (primary) hypertension: Secondary | ICD-10-CM

## 2021-07-10 DIAGNOSIS — K219 Gastro-esophageal reflux disease without esophagitis: Secondary | ICD-10-CM | POA: Diagnosis present

## 2021-07-10 DIAGNOSIS — Z9981 Dependence on supplemental oxygen: Secondary | ICD-10-CM | POA: Diagnosis not present

## 2021-07-10 DIAGNOSIS — D75838 Other thrombocytosis: Secondary | ICD-10-CM | POA: Diagnosis present

## 2021-07-10 DIAGNOSIS — R0902 Hypoxemia: Secondary | ICD-10-CM | POA: Diagnosis not present

## 2021-07-10 DIAGNOSIS — E876 Hypokalemia: Secondary | ICD-10-CM | POA: Diagnosis present

## 2021-07-10 DIAGNOSIS — I4510 Unspecified right bundle-branch block: Secondary | ICD-10-CM | POA: Diagnosis present

## 2021-07-10 DIAGNOSIS — J9601 Acute respiratory failure with hypoxia: Secondary | ICD-10-CM | POA: Diagnosis not present

## 2021-07-10 DIAGNOSIS — Z79890 Hormone replacement therapy (postmenopausal): Secondary | ICD-10-CM | POA: Diagnosis not present

## 2021-07-10 DIAGNOSIS — I70213 Atherosclerosis of native arteries of extremities with intermittent claudication, bilateral legs: Secondary | ICD-10-CM

## 2021-07-10 DIAGNOSIS — I739 Peripheral vascular disease, unspecified: Secondary | ICD-10-CM | POA: Diagnosis not present

## 2021-07-10 DIAGNOSIS — A419 Sepsis, unspecified organism: Secondary | ICD-10-CM | POA: Diagnosis not present

## 2021-07-10 DIAGNOSIS — I70219 Atherosclerosis of native arteries of extremities with intermittent claudication, unspecified extremity: Secondary | ICD-10-CM | POA: Diagnosis present

## 2021-07-10 DIAGNOSIS — J9621 Acute and chronic respiratory failure with hypoxia: Secondary | ICD-10-CM | POA: Diagnosis present

## 2021-07-10 DIAGNOSIS — R918 Other nonspecific abnormal finding of lung field: Secondary | ICD-10-CM | POA: Diagnosis not present

## 2021-07-10 DIAGNOSIS — Z90710 Acquired absence of both cervix and uterus: Secondary | ICD-10-CM | POA: Diagnosis not present

## 2021-07-10 DIAGNOSIS — Z8249 Family history of ischemic heart disease and other diseases of the circulatory system: Secondary | ICD-10-CM

## 2021-07-10 DIAGNOSIS — R0689 Other abnormalities of breathing: Secondary | ICD-10-CM | POA: Diagnosis not present

## 2021-07-10 DIAGNOSIS — Z87891 Personal history of nicotine dependence: Secondary | ICD-10-CM

## 2021-07-10 DIAGNOSIS — J189 Pneumonia, unspecified organism: Secondary | ICD-10-CM | POA: Diagnosis present

## 2021-07-10 DIAGNOSIS — R000 Tachycardia, unspecified: Secondary | ICD-10-CM | POA: Diagnosis not present

## 2021-07-10 DIAGNOSIS — R0602 Shortness of breath: Secondary | ICD-10-CM | POA: Diagnosis not present

## 2021-07-10 DIAGNOSIS — F32A Depression, unspecified: Secondary | ICD-10-CM | POA: Diagnosis present

## 2021-07-10 DIAGNOSIS — J90 Pleural effusion, not elsewhere classified: Secondary | ICD-10-CM | POA: Diagnosis not present

## 2021-07-10 DIAGNOSIS — J439 Emphysema, unspecified: Secondary | ICD-10-CM | POA: Diagnosis not present

## 2021-07-10 LAB — COMPREHENSIVE METABOLIC PANEL
ALT: 18 U/L (ref 0–44)
AST: 26 U/L (ref 15–41)
Albumin: 3.4 g/dL — ABNORMAL LOW (ref 3.5–5.0)
Alkaline Phosphatase: 83 U/L (ref 38–126)
Anion gap: 12 (ref 5–15)
BUN: 13 mg/dL (ref 8–23)
CO2: 24 mmol/L (ref 22–32)
Calcium: 8.5 mg/dL — ABNORMAL LOW (ref 8.9–10.3)
Chloride: 98 mmol/L (ref 98–111)
Creatinine, Ser: 0.67 mg/dL (ref 0.44–1.00)
GFR, Estimated: >60 mL/min (ref >60)
Glucose, Bld: 127 mg/dL — ABNORMAL HIGH (ref 70–99)
Potassium: 3 mmol/L — ABNORMAL LOW (ref 3.5–5.1)
Sodium: 134 mmol/L — ABNORMAL LOW (ref 135–145)
Total Bilirubin: 1.4 mg/dL — ABNORMAL HIGH (ref 0.3–1.2)
Total Protein: 7.9 g/dL (ref 6.5–8.1)

## 2021-07-10 LAB — CBC WITH DIFFERENTIAL/PLATELET
Abs Immature Granulocytes: 0.25 10*3/uL — ABNORMAL HIGH (ref 0.00–0.07)
Basophils Absolute: 0.1 10*3/uL (ref 0.0–0.1)
Basophils Relative: 0 %
Eosinophils Absolute: 0.1 10*3/uL (ref 0.0–0.5)
Eosinophils Relative: 0 %
HCT: 37.3 % (ref 36.0–46.0)
Hemoglobin: 12.6 g/dL (ref 12.0–15.0)
Immature Granulocytes: 1 %
Lymphocytes Relative: 2 %
Lymphs Abs: 0.7 10*3/uL (ref 0.7–4.0)
MCH: 30.7 pg (ref 26.0–34.0)
MCHC: 33.8 g/dL (ref 30.0–36.0)
MCV: 91 fL (ref 80.0–100.0)
Monocytes Absolute: 1.1 10*3/uL — ABNORMAL HIGH (ref 0.1–1.0)
Monocytes Relative: 4 %
Neutro Abs: 28.7 10*3/uL — ABNORMAL HIGH (ref 1.7–7.7)
Neutrophils Relative %: 93 %
Platelets: 539 10*3/uL — ABNORMAL HIGH (ref 150–400)
RBC: 4.1 MIL/uL (ref 3.87–5.11)
RDW: 13.5 % (ref 11.5–15.5)
Smear Review: INCREASED
WBC: 30.9 10*3/uL — ABNORMAL HIGH (ref 4.0–10.5)
nRBC: 0 % (ref 0.0–0.2)

## 2021-07-10 LAB — MAGNESIUM: Magnesium: 1.4 mg/dL — ABNORMAL LOW (ref 1.7–2.4)

## 2021-07-10 LAB — RESP PANEL BY RT-PCR (FLU A&B, COVID) ARPGX2
Influenza A by PCR: NEGATIVE
Influenza B by PCR: NEGATIVE
SARS Coronavirus 2 by RT PCR: NEGATIVE

## 2021-07-10 LAB — PROTIME-INR
INR: 1.3 — ABNORMAL HIGH (ref 0.8–1.2)
Prothrombin Time: 16.3 seconds — ABNORMAL HIGH (ref 11.4–15.2)

## 2021-07-10 LAB — LACTIC ACID, PLASMA
Lactic Acid, Venous: 1.6 mmol/L (ref 0.5–1.9)
Lactic Acid, Venous: 1.6 mmol/L (ref 0.5–1.9)

## 2021-07-10 MED ORDER — LACTATED RINGERS IV BOLUS
1000.0000 mL | Freq: Once | INTRAVENOUS | Status: AC
  Administered 2021-07-10: 1000 mL via INTRAVENOUS

## 2021-07-10 MED ORDER — UMECLIDINIUM BROMIDE 62.5 MCG/ACT IN AEPB
1.0000 | INHALATION_SPRAY | Freq: Every day | RESPIRATORY_TRACT | Status: DC
  Administered 2021-07-11 – 2021-07-13 (×3): 1 via RESPIRATORY_TRACT
  Filled 2021-07-10: qty 7

## 2021-07-10 MED ORDER — NIACINAMIDE 500 MG PO TABS: 500.0000 mg | ORAL_TABLET | Freq: Every day | ORAL | Status: DC

## 2021-07-10 MED ORDER — BENAZEPRIL HCL 10 MG PO TABS
10.0000 mg | ORAL_TABLET | Freq: Every day | ORAL | Status: DC
  Administered 2021-07-11 – 2021-07-13 (×3): 10 mg via ORAL
  Filled 2021-07-10 (×3): qty 1

## 2021-07-10 MED ORDER — LACTATED RINGERS IV SOLN: INTRAVENOUS | Status: DC

## 2021-07-10 MED ORDER — POTASSIUM CHLORIDE 2 MEQ/ML IV SOLN
INTRAVENOUS | Status: DC
  Filled 2021-07-10 (×2): qty 1000

## 2021-07-10 MED ORDER — PANTOPRAZOLE SODIUM 40 MG PO TBEC
40.0000 mg | DELAYED_RELEASE_TABLET | Freq: Every day | ORAL | Status: DC
  Administered 2021-07-11 – 2021-07-13 (×3): 40 mg via ORAL
  Filled 2021-07-10 (×3): qty 1

## 2021-07-10 MED ORDER — HYDROXYZINE HCL 25 MG PO TABS: 25.0000 mg | ORAL_TABLET | Freq: Three times a day (TID) | ORAL | Status: DC | PRN

## 2021-07-10 MED ORDER — FLUTICASONE FUROATE-VILANTEROL 100-25 MCG/ACT IN AEPB
1.0000 | INHALATION_SPRAY | Freq: Every day | RESPIRATORY_TRACT | Status: DC
  Administered 2021-07-11 – 2021-07-13 (×3): 1 via RESPIRATORY_TRACT
  Filled 2021-07-10: qty 28

## 2021-07-10 MED ORDER — MONTELUKAST SODIUM 10 MG PO TABS
10.0000 mg | ORAL_TABLET | Freq: Every day | ORAL | Status: DC
  Administered 2021-07-10 – 2021-07-12 (×3): 10 mg via ORAL
  Filled 2021-07-10 (×3): qty 1

## 2021-07-10 MED ORDER — IPRATROPIUM-ALBUTEROL 0.5-2.5 (3) MG/3ML IN SOLN: 3.0000 mL | Freq: Four times a day (QID) | RESPIRATORY_TRACT | Status: DC | PRN

## 2021-07-10 MED ORDER — POTASSIUM CHLORIDE CRYS ER 20 MEQ PO TBCR: 40.0000 meq | EXTENDED_RELEASE_TABLET | Freq: Once | ORAL | Status: DC

## 2021-07-10 MED ORDER — ONDANSETRON HCL 4 MG/2ML IJ SOLN: 4.0000 mg | Freq: Four times a day (QID) | INTRAMUSCULAR | Status: DC | PRN

## 2021-07-10 MED ORDER — ACETAMINOPHEN 325 MG PO TABS
650.0000 mg | ORAL_TABLET | Freq: Four times a day (QID) | ORAL | Status: DC | PRN
  Administered 2021-07-10: 650 mg via ORAL

## 2021-07-10 MED ORDER — LEVOFLOXACIN IN D5W 750 MG/150ML IV SOLN: 750.0000 mg | Freq: Once | INTRAVENOUS | Status: DC

## 2021-07-10 MED ORDER — CYCLOBENZAPRINE HCL 10 MG PO TABS
5.0000 mg | ORAL_TABLET | Freq: Three times a day (TID) | ORAL | Status: DC | PRN
  Filled 2021-07-10: qty 1

## 2021-07-10 MED ORDER — ESCITALOPRAM OXALATE 10 MG PO TABS
5.0000 mg | ORAL_TABLET | Freq: Every day | ORAL | Status: DC
  Administered 2021-07-11 – 2021-07-13 (×2): 5 mg via ORAL
  Filled 2021-07-10 (×2): qty 0.5
  Filled 2021-07-10: qty 1

## 2021-07-10 MED ORDER — FLUTICASONE PROPIONATE 50 MCG/ACT NA SUSP
1.0000 | Freq: Every day | NASAL | Status: DC
  Administered 2021-07-11 – 2021-07-13 (×3): 1 via NASAL
  Filled 2021-07-10: qty 16

## 2021-07-10 MED ORDER — FLUTICASONE-UMECLIDIN-VILANT 100-62.5-25 MCG/ACT IN AEPB: 1.0000 | INHALATION_SPRAY | Freq: Every day | RESPIRATORY_TRACT | Status: DC

## 2021-07-10 MED ORDER — ACETAMINOPHEN 325 MG RE SUPP: 650.0000 mg | Freq: Four times a day (QID) | RECTAL | Status: DC | PRN

## 2021-07-10 MED ORDER — THYROID 60 MG PO TABS
90.0000 mg | ORAL_TABLET | ORAL | Status: DC
  Administered 2021-07-11 – 2021-07-13 (×2): 90 mg via ORAL
  Filled 2021-07-10 (×2): qty 1

## 2021-07-10 MED ORDER — ACETAMINOPHEN 500 MG PO TABS
500.0000 mg | ORAL_TABLET | Freq: Four times a day (QID) | ORAL | Status: DC | PRN
  Administered 2021-07-11: 21:00:00 500 mg via ORAL
  Filled 2021-07-10 (×2): qty 1

## 2021-07-10 MED ORDER — THYROID 60 MG PO TABS
60.0000 mg | ORAL_TABLET | ORAL | Status: DC
  Administered 2021-07-12: 10:00:00 60 mg via ORAL
  Filled 2021-07-10: qty 1

## 2021-07-10 MED ORDER — CILOSTAZOL 100 MG PO TABS
100.0000 mg | ORAL_TABLET | Freq: Two times a day (BID) | ORAL | Status: DC
  Administered 2021-07-11 – 2021-07-13 (×5): 100 mg via ORAL
  Filled 2021-07-10 (×7): qty 1

## 2021-07-10 MED ORDER — THYROID 60 MG PO TABS: 90.0000 mg | ORAL_TABLET | ORAL | Status: DC

## 2021-07-10 MED ORDER — ACETAMINOPHEN 325 MG RE SUPP
325.0000 mg | Freq: Four times a day (QID) | RECTAL | Status: DC | PRN
  Filled 2021-07-10: qty 1

## 2021-07-10 MED ORDER — ATENOLOL 25 MG PO TABS
25.0000 mg | ORAL_TABLET | Freq: Every day | ORAL | Status: DC
  Administered 2021-07-10 – 2021-07-13 (×4): 25 mg via ORAL
  Filled 2021-07-10 (×4): qty 1

## 2021-07-10 MED ORDER — B COMPLEX-C PO TABS
1.0000 | ORAL_TABLET | Freq: Every day | ORAL | Status: DC
  Administered 2021-07-11 – 2021-07-13 (×3): 1 via ORAL
  Filled 2021-07-10 (×3): qty 1

## 2021-07-10 MED ORDER — GABAPENTIN 300 MG PO CAPS
300.0000 mg | ORAL_CAPSULE | Freq: Two times a day (BID) | ORAL | Status: DC
  Administered 2021-07-10 – 2021-07-13 (×6): 300 mg via ORAL
  Filled 2021-07-10 (×3): qty 1
  Filled 2021-07-10: qty 3
  Filled 2021-07-10 (×2): qty 1

## 2021-07-10 MED ORDER — ADULT MULTIVITAMIN W/MINERALS CH
1.0000 | ORAL_TABLET | Freq: Every day | ORAL | Status: DC
  Administered 2021-07-11 – 2021-07-13 (×3): 1 via ORAL
  Filled 2021-07-10 (×3): qty 1

## 2021-07-10 MED ORDER — LEVOFLOXACIN IN D5W 750 MG/150ML IV SOLN
750.0000 mg | Freq: Once | INTRAVENOUS | Status: AC
  Administered 2021-07-10: 750 mg via INTRAVENOUS
  Filled 2021-07-10: qty 150

## 2021-07-10 MED ORDER — LEVOFLOXACIN IN D5W 750 MG/150ML IV SOLN
750.0000 mg | INTRAVENOUS | Status: DC
  Administered 2021-07-12: 15:00:00 750 mg via INTRAVENOUS
  Filled 2021-07-10: qty 150

## 2021-07-10 MED ORDER — ONDANSETRON HCL 4 MG PO TABS: 4.0000 mg | ORAL_TABLET | Freq: Four times a day (QID) | ORAL | Status: DC | PRN

## 2021-07-10 MED ORDER — ENOXAPARIN SODIUM 40 MG/0.4ML IJ SOSY
40.0000 mg | PREFILLED_SYRINGE | INTRAMUSCULAR | Status: DC
  Administered 2021-07-11 – 2021-07-13 (×3): 40 mg via SUBCUTANEOUS
  Filled 2021-07-10 (×3): qty 0.4

## 2021-07-10 NOTE — Progress Notes (Signed)
PHARMACY -  BRIEF ANTIBIOTIC NOTE   Pharmacy has received consult(s) for levofloxacin from an ED provider.  The patient's profile has been reviewed for ht/wt/allergies/indication/available labs.    One time order(s) placed for: Levofloxacin 750 mg IV  Further antibiotics/pharmacy consults should be ordered by admitting physician if indicated.                       Thank you, Forde Dandy Hajer Dwyer 07/10/2021  2:06 PM

## 2021-07-10 NOTE — ED Triage Notes (Signed)
Arrived by EMS from home for SOB with cough X2 weeks. Productive cough (green). Reports her daughter recently had pneuomonia. EMS vitals 142HR, 150/90 b/p, 2L O2 continuous at 96%, 99.0 oral

## 2021-07-10 NOTE — Progress Notes (Signed)
CODE SEPSIS - PHARMACY COMMUNICATION  **Broad Spectrum Antibiotics should be administered within 1 hour of Sepsis diagnosis**  Time Code Sepsis Called/Page Received: 1335  Antibiotics Ordered: Levofloxacin  Time of 1st antibiotic administration: [place] ,PharmD Clinical Pharmacist  07/10/2021  2:06 PM

## 2021-07-10 NOTE — ED Provider Notes (Signed)
Emergency Medicine Provider Triage Evaluation Note  Savannah Mcclure , a 80 y.o. female  was evaluated in triage.  Pt complains of 1 week of shortness of breath, productive cough, dark and foul-smelling urine.    Review of Systems  Positive: Short of breath, productive cough, nausea, generalized weakness and dysuria Negative: Syncope  Physical Exam  BP (!) 149/75 (BP Location: Right Arm)   Pulse (!) 146   Temp 100.2 F (37.9 C) (Oral)   Resp (!) 26   Ht 5\' 1"  (1.549 m)   Wt 49.9 kg   SpO2 94%   BMI 20.78 kg/m  Gen:   Awake, appears uncomfortable Resp:  Tachypneic MSK:   Moves extremities without difficulty  Other:    Medical Decision Making  Medically screening exam initiated at 12:02 PM.  Appropriate orders placed.  Savannah Mcclure was informed that the remainder of the evaluation will be completed by another provider, this initial triage assessment does not replace that evaluation, and the importance of remaining in the ED until their evaluation is complete.  Sepsis screening protocols initiated   Vladimir Crofts, MD 07/10/21 [phone]

## 2021-07-10 NOTE — H&P (Signed)
History and Physical    Savannah Mcclure HCW:[id number] DOB: September 03, 1940 DOA: 07/10/2021  PCP: [name], DO   Patient coming from: Home  I have personally briefly reviewed patient's old medical records in [place]  Chief Complaint: Shortness of breath/cough  HPI: Savannah Mcclure is a 80 y.o. female with medical history significant for COPD with chronic respiratory failure on 2 L of oxygen at night, history of anxiety, nicotine dependence, peripheral vascular disease, hypertension and GERD who presents to the emergency room for evaluation of a 2-week history of worsening shortness of breath from her baseline associated with a cough productive of green phlegm as well as pleuritic chest pain and fever. She has had a sick contact and states that her daughter recently had pneumonia. Symptoms also associated with nausea an vomiting of bilious material. She denies having any urinary symptoms, no changes in her bowel habits, no headache, no dizziness, no lightheadedness, no abdominal pain, no lower extremity swelling, no palpitations, no diaphoresis, no blurred vision, no focal deficit. Sodium 134, potassium 3.0, chloride 98, bicarb 24, glucose 127, BUN 13, creatinine 0.67, calcium 8.5, alkaline phosphatase 83, albumin 3.4, AST 26, ALT 18, total protein 7.9, lactic acid 1.6, white count 13.9, hemoglobin 12.6, hematocrit 37.3, MCV 91.0, RDW 13.5, platelet count 539, PT 16.3, INR 1.3 Respiratory viral panel is negative Chest x-ray reviewed by me shows patchy bilateral pulmonary infiltrates consistent with pneumonia.  Hyperinflated lung fields. Twelve-lead EKG reviewed by me shows sinus tachycardia, low voltage QRS right bundle branch block      ED Course: Patient is an 80 year old female who presents to the ER for evaluation of a 2-week history of worsening shortness of breath from her baseline associated with fever and a cough productive of greenish phlegm. Per EMS  patient was tachycardic with heart rate of 140 bpm and a low-grade temp of 57F. In the ER she had a T-max of 100.4, she remains tachycardic and tachypneic with respiratory rate in the high 20s. She received 2 L IV fluid bolus as well as IV Levaquin and will be admitted to the hospital for further evaluation.    Review of Systems: As per HPI otherwise all other systems reviewed and negative.    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    COPD (chronic obstructive pulmonary disease) (HCC)    Delirium 03/18/2012   Dyspnea    GERD (gastroesophageal reflux disease)    Heart murmur    Hypertension    Leaky heart valve    Oxygen deficit    uses O2 at night 2 liters   Peripheral vascular disease ([place])    Scoliosis    Sepsis ([place])    Shock ([place]) 03/23/2012   SIRS (systemic inflammatory response syndrome) ([place]) 03/23/2012   Spinal stenosis    Stroke [facility])    Eye   Tachycardia     Past Surgical History:  Procedure Laterality Date   ABDOMINAL HYSTERECTOMY     total   BACK SURGERY     BREAST LUMPECTOMY Left    CATARACT EXTRACTION W/PHACO  Left 08/14/2019   Procedure: CATARACT EXTRACTION PHACO AND INTRAOCULAR LENS PLACEMENT (IOC) LEFT 10.22 00:59.7 42.4%;  Surgeon: Leandrew Koyanagi, MD;  Location: [place];  Service: Ophthalmology;  Laterality: Left;  leave it last patient per [place] Right 06/20/2018   Procedure: LOWER EXTREMITY ANGIOGRAPHY;  Surgeon: Algernon Huxley, MD;  Location: [place] CV LAB;  Service: Cardiovascular;  Laterality: Right;   LOWER EXTREMITY ANGIOGRAPHY Left 02/03/2020   Procedure: LOWER EXTREMITY  ANGIOGRAPHY;  Surgeon: Algernon Huxley, MD;  Location: [place] [facility];  Service: Cardiovascular;  Laterality: Left;   right eye surgery     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 She has a 40.00 pack-year smoking history. She has never used smokeless tobacco. She reports current alcohol use. She reports that she does not use drugs.  Allergies  Allergen Reactions   Tramadol Rash   Other Itching    Cat Gut sutures    Oxycodone Hives   Amlodipine Hives   Augmentin [Amoxicillin-Pot Clavulanate] Diarrhea   Azithromycin Hives   Contrast Media [Iodinated Diagnostic Agents] Hives   Doxycycline Diarrhea   Erythromycin Diarrhea   Hydrocodone Nausea Only   Lactose Intolerance (Gi) Diarrhea    Bloating   Minocycline Diarrhea   Nsaids Other (See Comments)    Internal Bleeding   Relafen [Nabumetone] Hives   Sulfa Antibiotics Hives   Synthroid [Levothyroxine] Diarrhea   Tetracyclines & Related Diarrhea   Tilactase     Family History  Problem Relation Age of Onset   Intracerebral hemorrhage Mother    Hypertension Mother    Heart failure Father    Schizophrenia Daughter    Rheum arthritis Maternal Grandmother    Kidney disease Maternal Grandmother    Heart disease Paternal Grandmother    Stroke Paternal Grandmother    Stroke Paternal Grandfather    Heart disease Paternal Grandfather       Prior to Admission medications   Medication Sig Start Date End Date Taking? Authorizing Provider  acetaminophen (TYLENOL) 500 MG tablet Take 500 mg by mouth every 6 (six) hours as needed.    [provider]  albuterol (VENTOLIN HFA) 108 (90 Base) MCG/ACT inhaler Inhale 2 puffs into the lungs every 6 (six) hours as needed for wheezing or shortness of breath. 04/20/21   Johnson, Megan P, DO  atenolol (TENORMIN) 25 MG tablet Take 1 tablet (25 mg total) by mouth daily. 04/20/21   Johnson, Megan P, DO  B-COMPLEX-C PO Take 1  tablet by mouth daily.    [provider]  benazepril (LOTENSIN) 10 MG tablet Take 1 tablet (10 mg total) by mouth daily. 04/20/21   Johnson, Megan P, DO  cilostazol (PLETAL) 100 MG tablet Take 1 tablet (100 mg total) by mouth 2 (two) times daily. 04/20/21   Johnson, Megan P, DO  cyclobenzaprine (FLEXERIL) 5 MG tablet Take 5 mg by mouth 3 (three) times daily as needed for muscle spasms.     [provider]  diphenhydrAMINE (BENADRYL) 25 MG tablet Take 25 mg by mouth every 6 (six) hours as needed.     [provider]  DUREZOL 0.05 % EMUL Place into the right eye every morning. 04/04/19   [provider]  escitalopram (LEXAPRO) 5 MG tablet 1/2 tab daily for 1 week, then increase to 1 tab daily 05/27/21   Park Liter P, DO  fluticasone (FLONASE) 50 MCG/ACT nasal spray 2 sprays into each nostril in the morning. Up to three sprays. 11/18/20   [provider]  Fluticasone-Umeclidin-Vilant (TRELEGY ELLIPTA) 100-62.5-25 MCG/INH AEPB Inhale 1 puff into the lungs daily. 03/15/21   Johnson, Megan P, DO  gabapentin (NEURONTIN) 300 MG capsule Take 1 capsule (300 mg total) by mouth 2 (two) times daily. 03/29/21 12/24/21  Gillis Santa, MD  hydrOXYzine (ATARAX/VISTARIL) 25 MG tablet Take 1 tablet (25 mg total) by mouth 3 (three) times daily as needed. 10/24/19   Johnson, Megan P, DO  lidocaine (LIDODERM) 5 % Place 1 patch onto the skin daily. Remove & Discard patch within 12 hours or as directed by MD 09/06/18   [name], DO  Misc Natural Products (JOINT HEALTH PO) Take by mouth daily.    [provider]  montelukast (SINGULAIR) 10 MG tablet Take 1 tablet (10 mg total) by mouth at bedtime. 06/16/21   Park Liter P, DO  Multiple Vitamins-Minerals (MULTIVITAMIN WITH MINERALS) tablet Take 1 tablet by mouth daily.    [provider]  NIACINAMIDE PO Take 1 capsule by mouth daily.    [provider]  omeprazole (PRILOSEC) 20 MG capsule Take 1 capsule  (20 mg total) by mouth daily. 05/27/21   Johnson, Megan P, DO  thyroid (ARMOUR THYROID) 60 MG tablet Take 1 tablet (60 mg total) by mouth every other day. To be alternated with the 90mg  tab 04/23/21   Park Liter P, DO  thyroid (ARMOUR THYROID) 90 MG tablet Take 1 tablet (90 mg total) by mouth every other day. To be alternated with the 60mg  tab 04/23/21   Park Liter P, DO  traMADol (ULTRAM) 50 MG tablet Take 1 tablet (50 mg total) by mouth every 6 (six) hours as needed for severe pain. 06/17/21 08/16/21  Gillis Santa, MD  UNABLE TO FIND Take 1 tablet by mouth 2 (two) times daily. Cardio FX    [provider]  UNABLE TO FIND Take 1 tablet by mouth daily. [place]  Patient not taking: Reported on 06/14/2021    [provider]    Physical Exam: Vitals:   07/10/21 1150 07/10/21 1151 07/10/21 1152 07/10/21 1340  BP:    (!) 172/65  Pulse:    (!) 130  Resp:   (!) 26 16  Temp:    99.5 F (37.5 C)  TempSrc:    Oral  SpO2:    99%  Weight:  49.9 kg    Height: 5\' 1"  (1.549 m)        Vitals:   07/10/21 1150 07/10/21 1151 07/10/21 1152 07/10/21 1340  BP:    (!) 172/65  Pulse:    (!) 130  Resp:   (!) 26 16  Temp:    99.5 F (37.5 C)  TempSrc:    Oral  SpO2:    99%  Weight:  49.9 kg    Height: 5\' 1"  (1.549 m)         Constitutional: Alert and oriented x 3 .  Acutely ill-appearing.  Thin and frail HEENT:      Head: Normocephalic and atraumatic.         Eyes: PERLA, EOMI, Conjunctivae are normal. Sclera is non-icteric.       Mouth/Throat: Mucous membranes are moist.       Neck: Supple with no signs of meningismus. Cardiovascular: Tachycardic. No murmurs, gallops, or rubs. 2+ symmetrical distal pulses are present . No JVD. No LE edema Respiratory: Tachypneic.faint  rhonchi in both lung fields bilaterally. No wheezes, crackles, or rhonchi.  Gastrointestinal: Soft, non tender, and non distended with positive bowel sounds.  Genitourinary: No CVA  tenderness. Musculoskeletal: Nontender with normal range of motion in all extremities. No cyanosis, or erythema of extremities. Neurologic:  Face is symmetric. Moving all extremities. No gross focal neurologic deficits . Skin: Skin is warm, dry.  No rash or ulcers Psychiatric: Mood and affect are normal    Labs on Admission: I have personally reviewed following labs and imaging studies  CBC: Recent Labs  Lab 07/10/21 1201  WBC 30.9*  NEUTROABS 28.7*  HGB 12.6  HCT 37.3  MCV 91.0  PLT 371*   Basic Metabolic Panel: Recent Labs  Lab 07/10/21 1201  NA 134*  K 3.0*  CL 98  CO2 24  GLUCOSE 127*  BUN 13  CREATININE 0.67  CALCIUM 8.5*   GFR: Estimated Creatinine Clearance: 41.6 mL/min (by C-G formula based on SCr of 0.67 mg/dL). Liver Function Tests: Recent Labs  Lab 07/10/21 1201  AST 26  ALT 18  ALKPHOS 83  BILITOT 1.4*  PROT 7.9  ALBUMIN 3.4*   No results for input(s): LIPASE, AMYLASE in the last 168 hours. No results for input(s): AMMONIA in the last 168 hours. Coagulation Profile: Recent Labs  Lab 07/10/21 1201  INR 1.3*   Cardiac Enzymes: No results for input(s): CKTOTAL, CKMB, CKMBINDEX, TROPONINI in the last 168 hours. BNP (last 3 results) No results for input(s): PROBNP in the last 8760 hours. HbA1C: No results for input(s): HGBA1C in the last 72 hours. CBG: No results for input(s): GLUCAP in the last 168 hours. Lipid Profile: No results for input(s): CHOL, HDL, LDLCALC, TRIG, CHOLHDL, LDLDIRECT in the last 72 hours. Thyroid Function Tests: No results for input(s): TSH, T4TOTAL, FREET4, T3FREE, THYROIDAB in the last 72 hours. Anemia Panel: No results for input(s): VITAMINB12, FOLATE, FERRITIN, TIBC, IRON, RETICCTPCT in the last 72 hours. Urine analysis:    Component Value Date/Time   COLORURINE AMBER (A) 10/31/2017 1713   APPEARANCEUR Clear 03/09/2020 1547   LABSPEC 1.020 10/31/2017 1713   LABSPEC 1.023 09/10/2012 2004   PHURINE 5.0  10/31/2017 1713   GLUCOSEU Negative 03/09/2020 1547   GLUCOSEU Negative 09/10/2012 2004   HGBUR NEGATIVE 10/31/2017 1713   BILIRUBINUR Negative 03/09/2020 1547   BILIRUBINUR Negative 09/10/2012 2004   KETONESUR NEGATIVE 10/31/2017 1713   PROTEINUR Negative 03/09/2020 1547   PROTEINUR NEGATIVE 10/31/2017 1713   NITRITE Negative 03/09/2020 1547   NITRITE NEGATIVE 10/31/2017 1713   LEUKOCYTESUR Trace (A) 03/09/2020 1547   LEUKOCYTESUR Negative 09/10/2012 2004    Radiological Exams on Admission: DG Chest 2 View  Result Date: 07/10/2021 CLINICAL DATA:  Suspected sepsis, shortness of breath and productive cough for 1 week, recent pneumonia, COPD, on O2 at night EXAM: CHEST - 2 VIEW COMPARISON:  12/07/2020 FINDINGS: Normal heart size, mediastinal contours, and pulmonary vascularity. Atherosclerotic calcification aorta. Emphysematous changes with patchy infiltrates throughout both lungs consistent with pneumonia. Minimal LEFT pleural effusion. No pneumothorax. Marked levoconvex thoracolumbar scoliosis. IMPRESSION: Patchy BILATERAL pulmonary infiltrates consistent with pneumonia. Underlying COPD with minimal LEFT pleural effusion. Aortic Atherosclerosis (ICD10-I70.0) and Emphysema (ICD10-J43.9). Electronically Signed   By: Lavonia Dana M.D.   On: 07/10/2021 12:19     Assessment/Plan Principal Problem:   Sepsis ([place]) Active Problems:   CAP (community acquired pneumonia)   HTN (hypertension)   COPD (chronic obstructive pulmonary disease) ([place])   History of tobacco abuse   PAD (peripheral artery  disease) ([place])   Atherosclerosis of native arteries of extremity with intermittent claudication ([place])   Acquired hypothyroidism   Coronary artery disease of native artery of native heart with stable angina pectoris [facility])     Patient is an 80 year old female admitted to the hospital for sepsis from community-acquired pneumonia    Sepsis ([place]A) As evidenced by fever with a T-max of 100.4 F,  tachycardia with heart rates between 120 and 140 bpm, tachypnea with respiratory rate in the high 20s, marked leukocytosis and chest x-ray findings suggestive of pneumonia. Continue aggressive IV fluid resuscitation Place patient on IV Levaquin adjusted to renal function Follow-up results of blood and sputum culture    COPD with chronic respiratory failure Stable and not acutely exacerbated Continue as needed bronchodilator therapy and inhaled steroids Continue oxygen supplementation at 2 L mostly at night and as needed during the day     Hypothyroidism Continue Synthroid    Hypertension Blood pressure stable Continue benazepril and atenolol    Depression Continue Lexapro    History of peripheral vascular disease Continue Pletal, atenolol and niacin    Nicotine dependence Smoking cessation was discussed with patient in detail She declines a nicotine transdermal patch at this time    Hypokalemia Secondary to GI losses from nausea and vomiting Supplement potassium Check magnesium levels  DVT prophylaxis: Lovenox  Code Status: full code  Family Communication: Greater than 50% of time was spent discussing patient's condition and plan of care with her at the bedside.  All questions and concerns have been addressed.  She verbalizes understanding and agrees to the plan.  CODE STATUS was discussed and she is a full code.  She lists her daughter Benjamine Mola as her healthcare power of attorney. Disposition Plan: Back to previous home environment Consults called: none  Status:At the time of admission, it appears that the appropriate admission status for this patient is inpatient. This is judged to be reasonable and necessary to provide the required intensity of service to ensure the patient's safety given the presenting symptoms, physical exam findings, and initial radiographic and laboratory data in the context of their comorbid conditions. Patient requires inpatient status  due to high intensity of service, high risk for further deterioration and high frequency of surveillance required.     Collier Bullock MD [facility]     07/10/2021, 2:11 PM

## 2021-07-10 NOTE — ED Triage Notes (Signed)
Pt via EMS from home. Pt c/o SOB and productive cough for a week. Pt states that her daughter recently had PNA. Pt has hx of COPD and usually only wear O2 at night. Pt is A&OX4 and but tachpneic

## 2021-07-10 NOTE — Progress Notes (Signed)
Pharmacy Antibiotic Note  Savannah Mcclure is a 80 y.o. female admitted on 07/10/2021 with pneumonia.  Pt has tolerated cephalosporins in the past but has allergy to azithromycin (hives). Pharmacy has been consulted for Levofloxacin dosing for 5 days.  Plan: Levofloxacin 750 mg IV q48 hrs Plan for 5 days Monitor renal function and adjust dose as appropriate  Height: 5\' 1"  (154.9 cm) Weight: 49.9 kg (110 lb) IBW/kg (Calculated) : 47.8  Temp (24hrs), Avg:99.9 F (37.7 C), Min:99.5 F (37.5 C), Max:100.2 F (37.9 C)  Recent Labs  Lab 07/10/21 1201  WBC 30.9*  CREATININE 0.67  LATICACIDVEN 1.6    Estimated Creatinine Clearance: 41.6 mL/min (by C-G formula based on SCr of 0.67 mg/dL).    Allergies  Allergen Reactions   Tramadol Rash   Other Itching    Cat Gut sutures    Oxycodone Hives   Amlodipine Hives   Augmentin [Amoxicillin-Pot Clavulanate] Diarrhea   Azithromycin Hives   Contrast Media [Iodinated Diagnostic Agents] Hives   Doxycycline Diarrhea   Erythromycin Diarrhea   Hydrocodone Nausea Only   Lactose Intolerance (Gi) Diarrhea    Bloating   Minocycline Diarrhea   Nsaids Other (See Comments)    Internal Bleeding   Relafen [Nabumetone] Hives   Sulfa Antibiotics Hives   Synthroid [Levothyroxine] Diarrhea   Tetracyclines & Related Diarrhea   Tilactase     Antimicrobials this admission: 11/26 levofloxacin >>    Microbiology results: 11/26 BCx: sent  Thank you for allowing pharmacy to be a part of this patient's care.  Forde Dandy Rosamaria Donn 07/10/2021 2:17 PM

## 2021-07-10 NOTE — ED Notes (Signed)
1 set of blood cultures sent to lab.  

## 2021-07-10 NOTE — Sepsis Progress Note (Signed)
Elink following code sepsis °

## 2021-07-10 NOTE — ED Provider Notes (Signed)
[facility] Emergency Department Provider Note   ____________________________________________   Event Date/Time   First MD Initiated Contact with Patient 07/10/21 1351     (approximate)  I have reviewed the triage vital signs and the nursing notes.   HISTORY  Chief Complaint Shortness of Breath and Cough    HPI Savannah Mcclure is a 80 y.o. female who presents for shortness of breath  LOCATION: Chest DURATION: 2 weeks prior to arrival TIMING: Worsening since onset SEVERITY: Severe QUALITY: Shortness of breath CONTEXT: Patient states she began having shortness of breath with productive cough approximately 2 weeks prior to arrival and has been worsening since onset MODIFYING FACTORS: Any exertion worsens her shortness of breath and is partially relieved with submental oxygenation ASSOCIATED SYMPTOMS: Productive cough of green sputum   Per medical record review, patient has history of COPD currently on oxygen at night when needed          Past Medical History:  Diagnosis Date   Abdominal distension 03/18/2012   Abnormal weight loss 10/30/2017   Acute kidney injury ([place]) 03/22/2012   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    COPD (chronic obstructive pulmonary disease) ([place])    Delirium 03/18/2012   Dyspnea    GERD (gastroesophageal reflux disease)    Heart murmur    Hypertension    Leaky heart valve    Oxygen deficit    uses O2 at night 2 liters   Peripheral vascular disease ([place])    Scoliosis    Sepsis ([place])    Shock ([place]) 03/23/2012   SIRS (systemic inflammatory response syndrome) ([place]) 03/23/2012   Spinal stenosis    Stroke [facility])    Eye   Tachycardia     Patient Active Problem List   Diagnosis Date Noted   Sepsis ([place]) 07/10/2021   Senile  purpura ([place]) 05/27/2021   Coronary artery disease of native artery of native heart with stable angina pectoris ([place]) 05/27/2021   Bronchiectasis without complication ([place]) 93/90/3009   Aortic atherosclerosis ([place]) 11/03/2020   Acquired hypothyroidism 09/13/2020   Thoracic or lumbosacral neuritis or radiculitis, unspecified 02/25/2020   Atherosclerosis of native artery of both lower extremities with intermittent claudication ([place]) 10/15/2018   Spondylosis without myelopathy or radiculopathy, lumbar region 06/19/2018   Thoracic spondylosis without myelopathy 06/19/2018   Atherosclerosis of native arteries of extremity with intermittent claudication ([place]) 04/13/2018   Carotid stenosis 04/13/2018   Lumbar degenerative disc disease 12/19/2017   Chronic, continuous use of opioids 12/19/2017   Chronic left shoulder pain 12/19/2017   Chronic pain syndrome 12/19/2017   Idiopathic scoliosis and kyphoscoliosis 10/30/2017   PAD (peripheral artery disease) ([place]) 10/30/2017   Vision loss 10/30/2017   Sinus tachycardia 10/30/2017   Anxiety 10/30/2017   Anemia 10/30/2017   Hypokalemia 10/30/2017   CAP (community acquired pneumonia) 10/07/2017   HTN (hypertension) 10/07/2017   COPD (chronic obstructive pulmonary disease) ([place]) 10/07/2017   Feeding problem in adult 07/25/2012   Gastrostomy in place [facility]) 07/25/2012   Hypoxemia 03/18/2012   Oliguria 03/18/2012   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   Degenerative joint disease 02/28/2012   Scoliosis 02/28/2012   History of tobacco abuse 07/25/2011  Dyslipidemia 07/25/2011   H/O chest pain 07/25/2011   Right bundle branch block 07/25/2011    Past Surgical History:  Procedure Laterality Date   ABDOMINAL HYSTERECTOMY     total   BACK SURGERY     BREAST LUMPECTOMY Left    CATARACT EXTRACTION W/PHACO Left 08/14/2019   Procedure: CATARACT EXTRACTION PHACO AND INTRAOCULAR LENS PLACEMENT (IOC) LEFT 10.22  00:59.7 42.4%;  Surgeon: Leandrew Koyanagi, MD;  Location: 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 Authorizing Provider  acetaminophen (TYLENOL) 500 MG tablet Take 500 mg by mouth every 6 (six) hours as needed.    [provider]  albuterol (VENTOLIN HFA) 108 (90 Base) MCG/ACT inhaler Inhale 2 puffs into the lungs every 6 (six) hours as needed for wheezing or shortness of breath. 04/20/21   Johnson, Megan P, DO  atenolol (TENORMIN) 25 MG tablet Take 1 tablet (25 mg total) by mouth daily. 04/20/21   Johnson, Megan P, DO  B-COMPLEX-C PO Take 1 tablet by mouth daily.    [provider]  benazepril (LOTENSIN) 10 MG tablet Take 1 tablet (10 mg total) by mouth daily. 04/20/21   Johnson, Megan P, DO  cilostazol (PLETAL) 100 MG tablet Take 1 tablet (100 mg total) by mouth 2 (two) times daily. 04/20/21   Johnson, Megan P, DO  cyclobenzaprine (FLEXERIL) 5 MG tablet Take 5 mg by mouth 3 (three) times daily as needed for muscle spasms.     [provider]  diphenhydrAMINE (BENADRYL) 25 MG tablet Take 25 mg by mouth every 6 (six) hours as needed.     [provider]  DUREZOL 0.05 % EMUL Place into the right eye every morning. 04/04/19   [provider]  escitalopram (LEXAPRO) 5 MG tablet 1/2 tab daily for 1 week, then increase to 1 tab daily 05/27/21   Wynetta Emery, Megan  P, DO  fluticasone (FLONASE) 50 MCG/ACT nasal spray 2 sprays into each nostril in the morning. Up to three sprays. 11/18/20   [provider]  Fluticasone-Umeclidin-Vilant (TRELEGY ELLIPTA) 100-62.5-25 MCG/INH AEPB Inhale 1 puff into the lungs daily. 03/15/21   Johnson, Megan P, DO  gabapentin (NEURONTIN) 300 MG capsule Take 1 capsule (300 mg total) by mouth 2 (two) times daily. 03/29/21 12/24/21  Gillis Santa, MD  hydrOXYzine (ATARAX/VISTARIL) 25 MG tablet Take 1 tablet (25 mg total) by mouth 3 (three) times daily as needed. 10/24/19   Johnson, Megan P, DO  lidocaine (LIDODERM) 5 % Place 1 patch onto the skin daily. Remove & Discard patch within 12 hours or as directed by MD 09/06/18   [name], DO  Misc Natural Products (JOINT HEALTH PO) Take by mouth daily.    [provider]  montelukast (SINGULAIR) 10 MG tablet Take 1 tablet (10 mg total) by mouth at bedtime. 06/16/21   [name], DO  Multiple  Vitamins-Minerals (MULTIVITAMIN WITH MINERALS) tablet Take 1 tablet by mouth daily.    [provider]  NIACINAMIDE PO Take 1 capsule by mouth daily.    [provider]  omeprazole (PRILOSEC) 20 MG capsule Take 1 capsule (20 mg total) by mouth daily. 05/27/21   Johnson, Megan P, DO  thyroid (ARMOUR THYROID) 60 MG tablet Take 1 tablet (60 mg total) by mouth every other day. To be alternated with the 90mg  tab 04/23/21   Park Liter P, DO  thyroid (ARMOUR THYROID) 90 MG tablet Take 1 tablet (90 mg total) by mouth every other day. To be alternated with the 60mg  tab 04/23/21   [place] Liter P, DO  traMADol (ULTRAM) 50 MG tablet Take 1 tablet (50 mg total) by mouth every 6 (six) hours as needed for severe pain. 06/17/21 08/16/21  Gillis Santa, MD  UNABLE TO FIND Take 1 tablet by mouth 2 (two) times daily. Cardio FX    [provider]  UNABLE TO FIND Take 1 tablet by mouth daily. [place]  Patient not taking: Reported on 06/14/2021    [provider]     Allergies Tramadol, Other, Oxycodone, Amlodipine, Augmentin [amoxicillin-pot clavulanate], Azithromycin, Contrast media [iodinated diagnostic agents], Doxycycline, Erythromycin, Hydrocodone, Lactose intolerance (gi), Minocycline, Nsaids, Relafen [nabumetone], Sulfa antibiotics, Synthroid [levothyroxine], Tetracyclines & related, and Tilactase  Family History  Problem Relation Age of Onset   Intracerebral hemorrhage Mother    Hypertension Mother    Heart failure Father    Schizophrenia Daughter    Rheum arthritis Maternal Grandmother    Kidney disease Maternal Grandmother    Heart disease Paternal Grandmother    Stroke Paternal Grandmother    Stroke Paternal Grandfather    Heart disease Paternal Grandfather     Social History Social History   Tobacco Use   Smoking status: Every Day    Packs/day: 1.00    Years: 40.00    Pack years: 40.00    Types: Cigarettes   Smokeless tobacco: Never   Tobacco comments:    0.5PPD 12/22/20  Vaping Use   Vaping Use: Former  Substance Use Topics   Alcohol use: Yes    Comment: wine on occasion (1x/month)   Drug use: No    Review of Systems Constitutional: No fever/chills Eyes: No visual changes. ENT: No sore throat. Cardiovascular: Denies chest pain. Respiratory: Endorses shortness of breath. Gastrointestinal: No abdominal pain.  No nausea, no vomiting.  No diarrhea. Genitourinary: Negative for dysuria. Musculoskeletal: Negative for acute arthralgias Skin: Negative for rash. Neurological: Negative for headaches, weakness/numbness/paresthesias in any extremity Psychiatric: Negative for suicidal ideation/homicidal ideation   ____________________________________________   PHYSICAL EXAM:  VITAL SIGNS: ED Triage Vitals  Enc Vitals Group     BP 07/10/21 1149 (!) 149/75     Pulse Rate 07/10/21 1149 (!) 146     Resp 07/10/21 1149 20     Temp 07/10/21 1149 100.2 F (37.9 C)     Temp Source 07/10/21 1149 Oral     SpO2 07/10/21  1149 94 %     Weight 07/10/21 1151 110 lb (49.9 kg)     Height 07/10/21 1150 5\' 1"  (1.549 m)     Head Circumference --      Peak Flow --      Pain Score 07/10/21 1150 4     Pain Loc --      Pain Edu? --      Excl. in [place]? --    Constitutional: Alert and oriented. Well  appearing and in no acute distress. Eyes: Conjunctivae are normal. PERRL. Head: Atraumatic. Nose: No congestion/rhinnorhea. Mouth/Throat: Mucous membranes are moist. Neck: No stridor Cardiovascular: Grossly normal heart sounds.  Good peripheral circulation. Respiratory: Increased respiratory effort.  Rhonchi over bilateral lung fields.  No retractions. Gastrointestinal: Soft and nontender. No distention. Musculoskeletal: No obvious deformities Neurologic:  Normal speech and language. No gross focal neurologic deficits are appreciated. Skin:  Skin is warm and dry. No rash noted. Psychiatric: Mood and affect are normal. Speech and behavior are normal.  ____________________________________________   LABS (all labs ordered are listed, but only abnormal results are displayed)  Labs Reviewed  COMPREHENSIVE METABOLIC PANEL - Abnormal; Notable for the following components:      Result Value   Sodium 134 (*)    Potassium 3.0 (*)    Glucose, Bld 127 (*)    Calcium 8.5 (*)    Albumin 3.4 (*)    Total Bilirubin 1.4 (*)    All other components within normal limits  CBC WITH DIFFERENTIAL/PLATELET - Abnormal; Notable for the following components:   WBC 30.9 (*)    Platelets 539 (*)    Neutro Abs 28.7 (*)    Monocytes Absolute 1.1 (*)    Abs Immature Granulocytes 0.25 (*)    All other components within normal limits  PROTIME-INR - Abnormal; Notable for the following components:   Prothrombin Time 16.3 (*)    INR 1.3 (*)    All other components within normal limits  RESP PANEL BY RT-PCR (FLU A&B, COVID) ARPGX2  CULTURE, BLOOD (ROUTINE X 2)  CULTURE, BLOOD (ROUTINE X 2)  URINE CULTURE  LACTIC ACID, PLASMA  LACTIC  ACID, PLASMA  URINALYSIS, ROUTINE W REFLEX MICROSCOPIC  MAGNESIUM   ____________________________________________  EKG  ED ECG REPORT I, Naaman Plummer, the attending physician, personally viewed and interpreted this ECG.  Date: 07/10/2021 EKG Time: 1158 Rate: 145 Rhythm: Tachycardic sinus rhythm QRS Axis: normal Intervals: normal ST/T Wave abnormalities: normal Narrative Interpretation: Tachycardic sinus rhythm no evidence of acute ischemia  ____________________________________________  RADIOLOGY  ED MD interpretation: Chest x-ray shows patchy bilateral pulmonary infiltrates consistent with pneumonia  Official radiology report(s): DG Chest 2 View  Result Date: 07/10/2021 CLINICAL DATA:  Suspected sepsis, shortness of breath and productive cough for 1 week, recent pneumonia, COPD, on O2 at night EXAM: CHEST - 2 VIEW COMPARISON:  12/07/2020 FINDINGS: Normal heart size, mediastinal contours, and pulmonary vascularity. Atherosclerotic calcification aorta. Emphysematous changes with patchy infiltrates throughout both lungs consistent with pneumonia. Minimal LEFT pleural effusion. No pneumothorax. Marked levoconvex thoracolumbar scoliosis. IMPRESSION: Patchy BILATERAL pulmonary infiltrates consistent with pneumonia. Underlying COPD with minimal LEFT pleural effusion. Aortic Atherosclerosis (ICD10-I70.0) and Emphysema (ICD10-J43.9). Electronically Signed   By: Lavonia Dana M.D.   On: 07/10/2021 12:19    ____________________________________________   PROCEDURES  Procedure(s) performed (including Critical Care):  .1-3 Lead EKG Interpretation Performed by: Naaman Plummer, MD Authorized by: Naaman Plummer, MD     Interpretation: normal     ECG rate:  129   ECG rate assessment: tachycardic     Rhythm: sinus tachycardia     Ectopy: none     Conduction: normal    CRITICAL CARE Performed by: Naaman Plummer   Total critical care time: 37 minutes  Critical care time was  exclusive of separately billable procedures and treating other patients.  Critical care was necessary to treat or prevent imminent or life-threatening deterioration.  Critical care was time spent personally by  me on the following activities: development of treatment plan with patient and/or surrogate as well as nursing, discussions with consultants, evaluation of patient's response to treatment, examination of patient, obtaining history from patient or surrogate, ordering and performing treatments and interventions, ordering and review of laboratory studies, ordering and review of radiographic studies, pulse oximetry and re-evaluation of patient's condition.  ____________________________________________   INITIAL IMPRESSION / ASSESSMENT AND PLAN / ED COURSE  As part of my medical decision making, I reviewed the following data within the electronic medical record, if available:  Nursing notes reviewed and incorporated, Labs reviewed, EKG interpreted, Old chart reviewed, Radiograph reviewed and Notes from prior ED visits reviewed and incorporated      Presents with shortness of breath, cough, and malaise concerning for pneumonia.  DDx: PE, COPD exacerbation, Pneumothorax, TB, Atypical ACS, Esophageal Rupture, Toxic Exposure, Foreign Body Airway Obstruction.  Workup: CXR CBC, CMP, lactate, troponin  Given History, Exam, and Workup presentation most consistent with pneumonia.  Findings: Bilateral patchy infiltrates on chest x-ray  Tx: Ceftriaxone 1g IV Azithromycin 500mg  IV  1455 Reassessment: As patient is continuing to require supplemental oxygenation for acute hypoxic respiratory failure, patient will require admission to the internal medicine service for further evaluation and management  Disposition: Admit     ____________________________________________   FINAL CLINICAL IMPRESSION(S) / ED DIAGNOSES  Final diagnoses:  Acute respiratory failure with hypoxia ([place])   Community acquired pneumonia, unspecified laterality  COPD exacerbation [facility])     ED Discharge Orders     None        Note:  This document was prepared using Dragon voice recognition software and may include unintentional dictation errors.    Naaman Plummer, MD 07/11/21 [phone]

## 2021-07-11 DIAGNOSIS — I10 Essential (primary) hypertension: Secondary | ICD-10-CM

## 2021-07-11 DIAGNOSIS — E876 Hypokalemia: Secondary | ICD-10-CM

## 2021-07-11 DIAGNOSIS — J441 Chronic obstructive pulmonary disease with (acute) exacerbation: Secondary | ICD-10-CM

## 2021-07-11 DIAGNOSIS — J189 Pneumonia, unspecified organism: Secondary | ICD-10-CM | POA: Diagnosis not present

## 2021-07-11 DIAGNOSIS — A419 Sepsis, unspecified organism: Secondary | ICD-10-CM | POA: Diagnosis not present

## 2021-07-11 LAB — BASIC METABOLIC PANEL
Anion gap: 5 (ref 5–15)
BUN: 8 mg/dL (ref 8–23)
CO2: 27 mmol/L (ref 22–32)
Calcium: 7.9 mg/dL — ABNORMAL LOW (ref 8.9–10.3)
Chloride: 103 mmol/L (ref 98–111)
Creatinine, Ser: 0.52 mg/dL (ref 0.44–1.00)
GFR, Estimated: >60 mL/min (ref >60)
Glucose, Bld: 88 mg/dL (ref 70–99)
Potassium: 3.1 mmol/L — ABNORMAL LOW (ref 3.5–5.1)
Sodium: 135 mmol/L (ref 135–145)

## 2021-07-11 LAB — CBC
HCT: 30.2 % — ABNORMAL LOW (ref 36.0–46.0)
Hemoglobin: 10.1 g/dL — ABNORMAL LOW (ref 12.0–15.0)
MCH: 29.9 pg (ref 26.0–34.0)
MCHC: 33.4 g/dL (ref 30.0–36.0)
MCV: 89.3 fL (ref 80.0–100.0)
Platelets: 401 10*3/uL — ABNORMAL HIGH (ref 150–400)
RBC: 3.38 MIL/uL — ABNORMAL LOW (ref 3.87–5.11)
RDW: 13.5 % (ref 11.5–15.5)
WBC: 17.6 10*3/uL — ABNORMAL HIGH (ref 4.0–10.5)
nRBC: 0 % (ref 0.0–0.2)

## 2021-07-11 LAB — URINALYSIS, ROUTINE W REFLEX MICROSCOPIC
Glucose, UA: NEGATIVE mg/dL
Ketones, ur: 15 mg/dL — AB
Leukocytes,Ua: NEGATIVE
Nitrite: POSITIVE — AB
Protein, ur: 100 mg/dL — AB
Specific Gravity, Urine: >1.03 — ABNORMAL HIGH (ref 1.005–1.030)
pH: 5.5 (ref 5.0–8.0)

## 2021-07-11 LAB — PROCALCITONIN: Procalcitonin: 0.49 ng/mL

## 2021-07-11 LAB — CORTISOL-AM, BLOOD: Cortisol - AM: 19.9 ug/dL (ref 6.7–22.6)

## 2021-07-11 LAB — PROTIME-INR
INR: 1.4 — ABNORMAL HIGH (ref 0.8–1.2)
Prothrombin Time: 16.8 seconds — ABNORMAL HIGH (ref 11.4–15.2)

## 2021-07-11 LAB — MAGNESIUM: Magnesium: 1.1 mg/dL — ABNORMAL LOW (ref 1.7–2.4)

## 2021-07-11 MED ORDER — METHYLPREDNISOLONE SODIUM SUCC 40 MG IJ SOLR
40.0000 mg | Freq: Every day | INTRAMUSCULAR | Status: DC
  Administered 2021-07-11 – 2021-07-13 (×3): 40 mg via INTRAVENOUS
  Filled 2021-07-11 (×3): qty 1

## 2021-07-11 MED ORDER — MAGNESIUM SULFATE 2 GM/50ML IV SOLN
2.0000 g | Freq: Once | INTRAVENOUS | Status: AC
  Administered 2021-07-11: 21:00:00 2 g via INTRAVENOUS
  Filled 2021-07-11: qty 50

## 2021-07-11 MED ORDER — POTASSIUM CHLORIDE CRYS ER 20 MEQ PO TBCR: 20.0000 meq | EXTENDED_RELEASE_TABLET | Freq: Two times a day (BID) | ORAL | Status: DC

## 2021-07-11 MED ORDER — POTASSIUM CHLORIDE 10 MEQ/100ML IV SOLN
10.0000 meq | INTRAVENOUS | Status: AC
  Administered 2021-07-11 – 2021-07-12 (×4): 10 meq via INTRAVENOUS
  Filled 2021-07-11 (×2): qty 100

## 2021-07-11 MED ORDER — MAGNESIUM SULFATE 4 GM/100ML IV SOLN
4.0000 g | Freq: Once | INTRAVENOUS | Status: AC
Start: 2021-07-11 — End: 2021-07-11
  Administered 2021-07-11: 15:00:00 4 g via INTRAVENOUS
  Filled 2021-07-11: qty 100

## 2021-07-11 MED ORDER — GUAIFENESIN-DM 100-10 MG/5ML PO SYRP
5.0000 mL | ORAL_SOLUTION | ORAL | Status: DC | PRN
  Administered 2021-07-11: 22:00:00 5 mL via ORAL
  Filled 2021-07-11: qty 5

## 2021-07-11 MED ORDER — POTASSIUM CHLORIDE CRYS ER 20 MEQ PO TBCR
40.0000 meq | EXTENDED_RELEASE_TABLET | Freq: Once | ORAL | Status: DC
  Filled 2021-07-11: qty 2

## 2021-07-11 NOTE — Progress Notes (Addendum)
Patient ID: Savannah Mcclure, female   DOB: 23-Oct-1940, 80 y.o.   MRN: [id number] [facility] PROGRESS NOTE  Savannah Mcclure [id number] DOB: 10-03-1940 DOA: 07/10/2021 PCP: Park Liter P, DO  HPI/Subjective: Patient feeling weak.  Has some cough.  Admitted with pneumonia.  Objective: Vitals:   07/11/21 1000 07/11/21 1134  BP: (!) 152/67 (!) 148/70  Pulse: (!) 111 (!) 102  Resp: (!) 23 (!) 22  Temp:    SpO2: 90% 92%    Intake/Output Summary (Last 24 hours) at 07/11/2021 1337 Last data filed at 07/11/2021 1540 Gross per 24 hour  Intake 1000 ml  Output 100 ml  Net 900 ml   Filed Weights   07/10/21 1151  Weight: 49.9 kg    ROS: Review of Systems  Respiratory:  Negative for shortness of breath.   Cardiovascular:  Negative for chest pain.  Gastrointestinal:  Negative for abdominal pain, nausea and vomiting.  Exam: Physical Exam HENT:     Head: Normocephalic.     Mouth/Throat:     Pharynx: No oropharyngeal exudate.  Eyes:     General: Lids are normal.     Conjunctiva/sclera: Conjunctivae normal.     Pupils: Pupils are equal, round, and reactive to light.  Cardiovascular:     Rate and Rhythm: Normal rate and regular rhythm.     Heart sounds: Normal heart sounds, S1 normal and S2 normal.  Pulmonary:     Breath sounds: Examination of the right-lower field reveals decreased breath sounds and rhonchi. Examination of the left-lower field reveals decreased breath sounds and rhonchi. Decreased breath sounds and rhonchi present. No wheezing or rales.  Abdominal:     Palpations: Abdomen is soft.     Tenderness: There is no abdominal tenderness.  Musculoskeletal:     Right lower leg: No swelling.     Left lower leg: No swelling.  Skin:    General: Skin is warm.     Findings: No rash.  Neurological:     Mental Status: She is alert and oriented to person, place, and time.      Scheduled Meds:  atenolol  25 mg Oral Daily   B-complex with  vitamin C  1 tablet Oral Daily   benazepril  10 mg Oral Daily   cilostazol  100 mg Oral BID   enoxaparin (LOVENOX) injection  40 mg Subcutaneous Q24H   escitalopram  5 mg Oral Daily   fluticasone  1 spray Each Nare Daily   fluticasone furoate-vilanterol  1 puff Inhalation Daily   And   umeclidinium bromide  1 puff Inhalation Daily   gabapentin  300 mg Oral BID   methylPREDNISolone (SOLU-MEDROL) injection  40 mg Intravenous Daily   montelukast  10 mg Oral QHS   multivitamin with minerals  1 tablet Oral Daily   pantoprazole  40 mg Oral Daily   potassium chloride  20 mEq Oral BID   potassium chloride  40 mEq Oral Once   thyroid  60 mg Oral QODAY   thyroid  90 mg Oral QODAY   Continuous Infusions:  [START ON 07/12/2021] levofloxacin (LEVAQUIN) IV     magnesium sulfate bolus IVPB     magnesium sulfate bolus IVPB      Assessment/Plan:  Sepsis, present on admission with fever of 100.4, tachycardia, tachypnea and leukocytosis.  Patient found to have multifocal pneumonia on chest x-ray.  Continue Levaquin.  White blood cell count trending better from 30.9 down to 17.6. COPD exacerbation with chronic hypoxia  at night.  On oxygen 2 L at nighttime.  Add Solu-Medrol.  Continue nebulizer treatments. Hypomagnesemia.  Magnesium 1.1 this morning.  4 g IV magnesium ordered.  Will order another 2 g this evening and recheck level tomorrow Hypokalemia replace potassium orally today Just notified that the patient did not have diarrhea.  Cancel C. difficile order. Essential hypertension on benazepril and atenolol Depression on Lexapro Peripheral vascular disease on Pletal and niacin Neuropathy on gabapentin Hypothyroidism unspecified on Armour Thyroid Thrombocytosis likely from infection        Code Status:     Code Status Orders  (From admission, onward)           Start     Ordered   07/10/21 1406  Full code  Continuous        07/10/21 1407           Code Status History      Date Active Date Inactive Code Status Order ID Comments User Context   02/03/2020 1230 02/03/2020 2149 Full Code [id number]  Algernon Huxley, MD Inpatient   10/08/2017 0037 10/13/2017 1941 Full Code [id number]  Lance Coon, MD Inpatient   08/25/2017 1459 08/27/2017 1747 Full Code [id number]  [name], MD ED      Disposition Plan: Status is: Inpatient  Antibiotics: Levaquin  Time spent: 28 minutes  Janeice Stegall [employer]

## 2021-07-11 NOTE — ED Notes (Signed)
Breakfast tray ordered per pt request

## 2021-07-12 DIAGNOSIS — I739 Peripheral vascular disease, unspecified: Secondary | ICD-10-CM

## 2021-07-12 DIAGNOSIS — J189 Pneumonia, unspecified organism: Secondary | ICD-10-CM | POA: Diagnosis not present

## 2021-07-12 DIAGNOSIS — A419 Sepsis, unspecified organism: Secondary | ICD-10-CM | POA: Diagnosis not present

## 2021-07-12 DIAGNOSIS — I10 Essential (primary) hypertension: Secondary | ICD-10-CM | POA: Diagnosis not present

## 2021-07-12 DIAGNOSIS — D75839 Thrombocytosis, unspecified: Secondary | ICD-10-CM

## 2021-07-12 LAB — BASIC METABOLIC PANEL
Anion gap: 7 (ref 5–15)
BUN: 11 mg/dL (ref 8–23)
CO2: 25 mmol/L (ref 22–32)
Calcium: 8.1 mg/dL — ABNORMAL LOW (ref 8.9–10.3)
Chloride: 104 mmol/L (ref 98–111)
Creatinine, Ser: 0.52 mg/dL (ref 0.44–1.00)
GFR, Estimated: >60 mL/min (ref >60)
Glucose, Bld: 137 mg/dL — ABNORMAL HIGH (ref 70–99)
Potassium: 3.9 mmol/L (ref 3.5–5.1)
Sodium: 136 mmol/L (ref 135–145)

## 2021-07-12 LAB — CBC
HCT: 30.5 % — ABNORMAL LOW (ref 36.0–46.0)
Hemoglobin: 10.2 g/dL — ABNORMAL LOW (ref 12.0–15.0)
MCH: 29.8 pg (ref 26.0–34.0)
MCHC: 33.4 g/dL (ref 30.0–36.0)
MCV: 89.2 fL (ref 80.0–100.0)
Platelets: 499 10*3/uL — ABNORMAL HIGH (ref 150–400)
RBC: 3.42 MIL/uL — ABNORMAL LOW (ref 3.87–5.11)
RDW: 13.6 % (ref 11.5–15.5)
WBC: 15.8 10*3/uL — ABNORMAL HIGH (ref 4.0–10.5)
nRBC: 0 % (ref 0.0–0.2)

## 2021-07-12 LAB — MAGNESIUM: Magnesium: 2.6 mg/dL — ABNORMAL HIGH (ref 1.7–2.4)

## 2021-07-12 LAB — URINE CULTURE: Culture: <10000 — AB

## 2021-07-12 NOTE — Progress Notes (Signed)
Patient ID: Savannah Mcclure, female   DOB: 09/18/1940, 80 y.o.   MRN: [id number] [facility] PROGRESS NOTE  Savannah Mcclure [id number] DOB: 1941/08/10 DOA: 07/10/2021 PCP: Park Liter P, DO  HPI/Subjective: Patient still feeling very weak.  Still has some cough and short of breath.  Admitted with sepsis and pneumonia.  Objective: Vitals:   07/12/21 0832 07/12/21 0834  BP: (!) 167/74 (!) 163/81  Pulse: (!) 104 (!) 106  Resp: 18   Temp: 98 F (36.7 C)   SpO2: 93%     Intake/Output Summary (Last 24 hours) at 07/12/2021 1404 Last data filed at 07/12/2021 1022 Gross per 24 hour  Intake 440 ml  Output --  Net 440 ml   Filed Weights   07/10/21 1151  Weight: 49.9 kg    ROS: Review of Systems  Respiratory:  Positive for shortness of breath.   Cardiovascular:  Negative for chest pain.  Gastrointestinal:  Negative for abdominal pain, nausea and vomiting.  Exam: Physical Exam HENT:     Head: Normocephalic.     Mouth/Throat:     Pharynx: No oropharyngeal exudate.  Eyes:     General: Lids are normal.     Conjunctiva/sclera: Conjunctivae normal.  Cardiovascular:     Rate and Rhythm: Normal rate and regular rhythm.     Heart sounds: Normal heart sounds, S1 normal and S2 normal.  Pulmonary:     Breath sounds: Examination of the right-lower field reveals decreased breath sounds and rhonchi. Examination of the left-lower field reveals decreased breath sounds and rhonchi. Decreased breath sounds and rhonchi present. No wheezing or rales.  Abdominal:     Palpations: Abdomen is soft.     Tenderness: There is no abdominal tenderness.  Musculoskeletal:     Right lower leg: No swelling.     Left lower leg: No swelling.  Skin:    General: Skin is warm.     Findings: No rash.  Neurological:     Mental Status: She is alert and oriented to person, place, and time.      Scheduled Meds:  atenolol  25 mg Oral Daily   B-complex with vitamin C  1 tablet  Oral Daily   benazepril  10 mg Oral Daily   cilostazol  100 mg Oral BID   enoxaparin (LOVENOX) injection  40 mg Subcutaneous Q24H   escitalopram  5 mg Oral Daily   fluticasone  1 spray Each Nare Daily   fluticasone furoate-vilanterol  1 puff Inhalation Daily   And   umeclidinium bromide  1 puff Inhalation Daily   gabapentin  300 mg Oral BID   methylPREDNISolone (SOLU-MEDROL) injection  40 mg Intravenous Daily   montelukast  10 mg Oral QHS   multivitamin with minerals  1 tablet Oral Daily   pantoprazole  40 mg Oral Daily   thyroid  60 mg Oral QODAY   thyroid  90 mg Oral QODAY   Continuous Infusions:  levofloxacin (LEVAQUIN) IV      Assessment/Plan:  Clinical sepsis, present on admission with fever, tachycardia, tachypnea and leukocytosis.  Multifocal pneumonia on x-ray.  Patient on Levaquin.  Blood cell count is come down from 30.9 down to 15.8. 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  On oxygen 2 L at nighttime.  Try to get off oxygen during the day.  Continue Solu-Medrol nebulizer treatments. Hypomagnesemia and hypokalemia replaced yesterday into the normal range today. Essential hypertension on benazepril and atenolol Depression.  Continue Lexapro Peripheral vascular disease  on Pletal niacin Neuropathy on gabapentin Hypothyroidism unspecified.  Continue Armour Thyroid Thrombocytosis likely secondary to infection Weakness.  Physical therapy evaluate     Code Status:     Code Status Orders  (From admission, onward)           Start     Ordered   07/10/21 1406  Full code  Continuous        07/10/21 1407           Code Status History     Date Active Date Inactive Code Status Order ID Comments User Context   02/03/2020 1230 02/03/2020 2149 Full Code [id number]  Algernon Huxley, MD Inpatient   10/08/2017 0037 10/13/2017 1941 Full Code [id number]  Lance Coon, MD Inpatient   08/25/2017 1459 08/27/2017 1747 Full Code [id number]  [name], MD ED       Family Communication: Spoke with patients daughter on phone Disposition Plan: Status is: Inpatient  Antibiotics: Levaquin  Time spent: 27 minutes  [place]

## 2021-07-12 NOTE — Evaluation (Signed)
Physical Therapy Evaluation Patient Details Name: Savannah Mcclure MRN: [id number] DOB: 1940/12/21 Today's Date: 07/12/2021  History of Present Illness  Savannah Mcclure is a 80 y.o. female with medical history significant for COPD with chronic respiratory failure on 2 L of oxygen at night, history of anxiety, nicotine dependence, peripheral vascular disease, hypertension and GERD who presents to the emergency room for evaluation of a 2-week history of worsening shortness of breath from her baseline associated with a cough productive of green phlegm as well as pleuritic chest pain and fever.   Clinical Impression  Pt admitted with above diagnosis. Pt received in bed agreeable to PT intervention with encouragement. Pt resting on 1 L/min [place]. Per RN request, trialed on RA with SPO2 remained > 90%. Pt able to report home layout, PLOF, assist level at pt disposal of friends/family, and DME without difficulty. Reports being independent with all ADL's/IADL's at baseline. Pt is mod-I with bed mobility and supervision to stand and amb to bathroom to void bladder independently with perihygiene and hand hygiene then returned to recliner. Pt reporting increased fatigue from baseline declining further mobility. SPO2 93% post amb. Pt educated on benefits of pursed lip breathing and benefits of sitting upright for pulmonary hygiene. Pt will benefit from [facility] PT to improve LE endurance deficits to improve standing and walking tolerance for OOB ADL's. Pt currently with functional limitations due to the deficits listed below (see PT Problem List). Pt will benefit from skilled PT to increase their independence and safety with mobility to allow discharge to the venue listed below.     Recommendations for follow up therapy are one component of a multi-disciplinary discharge planning process, led by the attending physician.  Recommendations may be updated based on patient status, additional functional criteria  and insurance authorization.  Follow Up Recommendations Home health PT    Assistance Recommended at Discharge PRN  Functional Status Assessment Patient has had a recent decline in their functional status and demonstrates the ability to make significant improvements in function in a reasonable and predictable amount of time.  Equipment Recommendations  None recommended by PT    Recommendations for Other Services       Precautions / Restrictions Precautions Precautions: Fall Restrictions Weight Bearing Restrictions: No      Mobility  Bed Mobility Overal bed mobility: Modified Independent               Patient Response: Cooperative  Transfers Overall transfer level: Needs assistance Equipment used: None Transfers: Sit to/from Stand Sit to Stand: Supervision                Ambulation/Gait Ambulation/Gait assistance: Supervision Gait Distance (Feet): 40 Feet (20'+20' seated rest b/t in bathroom) Assistive device: None Gait Pattern/deviations: Step-through pattern;Decreased step length - left;Decreased step length - right       General Gait Details: Intermittent use of UE's on stable surface  Stairs            Wheelchair Mobility    Modified Rankin (Stroke Patients Only)       Balance Overall balance assessment: Needs assistance Sitting-balance support: No upper extremity supported;Feet supported Sitting balance-Leahy Scale: Normal       Standing balance-Leahy Scale: Good                               Pertinent Vitals/Pain Pain Assessment: No/denies pain    Home Living Family/patient expects to be discharged  to:: Private residence Living Arrangements: Children;Other relatives Available Help at Discharge: Family;Available PRN/intermittently Type of Home: House Home Access: Stairs to enter Entrance Stairs-Rails: Can reach both Entrance Stairs-Number of Steps: 5   Home Layout: One level Home Equipment: Cane - single  point;Grab bars - tub/shower      Prior Function Prior Level of Function : Independent/Modified Independent                     Hand Dominance        Extremity/Trunk Assessment   Upper Extremity Assessment Upper Extremity Assessment: Overall WFL for tasks assessed    Lower Extremity Assessment Lower Extremity Assessment: Overall WFL for tasks assessed    Cervical / Trunk Assessment Cervical / Trunk Assessment: Other exceptions Cervical / Trunk Exceptions: significant scoliosis  Communication   Communication: No difficulties  Cognition Arousal/Alertness: Awake/alert Behavior During Therapy: WFL for tasks assessed/performed Overall Cognitive Status: Within Functional Limits for tasks assessed                                          General Comments General comments (skin integrity, edema, etc.): on RA SPO2 maintained >90% with mobility    Exercises Other Exercises Other Exercises: Role of PT in acute setting, d/c recs   Assessment/Plan    PT Assessment Patient needs continued PT services  PT Problem List Decreased strength;Cardiopulmonary status limiting activity;Decreased activity tolerance;Decreased mobility       PT Treatment Interventions DME instruction;Therapeutic exercise;Gait training;Balance training;Stair training;Neuromuscular re-education;Functional mobility training;Therapeutic activities;Patient/family education    PT Goals (Current goals can be found in the Care Plan section)  Acute Rehab PT Goals Patient Stated Goal: to go home PT Goal Formulation: With patient Time For Goal Achievement: 07/26/21 Potential to Achieve Goals: Good    Frequency Min 2X/week   Barriers to discharge        Co-evaluation               AM-PAC PT "6 Clicks" Mobility  Outcome Measure Help needed turning from your back to your side while in a flat bed without using bedrails?: None Help needed moving from lying on your back to sitting  on the side of a flat bed without using bedrails?: None Help needed moving to and from a bed to a chair (including a wheelchair)?: A Little Help needed standing up from a chair using your arms (e.g., wheelchair or bedside chair)?: A Little Help needed to walk in hospital room?: A Little Help needed climbing 3-5 steps with a railing? : A Little 6 Click Score: 20    End of Session   Activity Tolerance: Patient limited by fatigue Patient left: in chair;with chair alarm set Nurse Communication: Mobility status PT Visit Diagnosis: Muscle weakness (generalized) (M62.81);Difficulty in walking, not elsewhere classified (R26.2)    Time: 2956-2130 PT Time Calculation (min) (ACUTE ONLY): 22 min   Charges:     PT Treatments $Therapeutic Activity: 8-22 mins        Jareth Pardee M. Fairly IV, PT, DPT Physical Therapist- [facility]  07/12/2021, 3:40 PM

## 2021-07-13 DIAGNOSIS — A419 Sepsis, unspecified organism: Secondary | ICD-10-CM | POA: Diagnosis not present

## 2021-07-13 DIAGNOSIS — J189 Pneumonia, unspecified organism: Secondary | ICD-10-CM | POA: Diagnosis not present

## 2021-07-13 DIAGNOSIS — J9601 Acute respiratory failure with hypoxia: Secondary | ICD-10-CM | POA: Diagnosis not present

## 2021-07-13 LAB — CBC
HCT: 29.8 % — ABNORMAL LOW (ref 36.0–46.0)
Hemoglobin: 10.1 g/dL — ABNORMAL LOW (ref 12.0–15.0)
MCH: 30.7 pg (ref 26.0–34.0)
MCHC: 33.9 g/dL (ref 30.0–36.0)
MCV: 90.6 fL (ref 80.0–100.0)
Platelets: 547 10*3/uL — ABNORMAL HIGH (ref 150–400)
RBC: 3.29 MIL/uL — ABNORMAL LOW (ref 3.87–5.11)
RDW: 13.7 % (ref 11.5–15.5)
WBC: 19.4 10*3/uL — ABNORMAL HIGH (ref 4.0–10.5)
nRBC: 0 % (ref 0.0–0.2)

## 2021-07-13 MED ORDER — PREDNISONE 10 MG PO TABS: ORAL_TABLET | ORAL | 0 refills | Status: DC

## 2021-07-13 MED ORDER — LEVOFLOXACIN 500 MG PO TABS: 500.0000 mg | ORAL_TABLET | Freq: Every day | ORAL | 0 refills | Status: AC

## 2021-07-13 MED ORDER — LEVOFLOXACIN IN D5W 750 MG/150ML IV SOLN
750.0000 mg | INTRAVENOUS | Status: AC
  Administered 2021-07-13: 750 mg via INTRAVENOUS
  Filled 2021-07-13: qty 150

## 2021-07-13 NOTE — TOC Initial Note (Signed)
Transition of Care [facility]) - Initial/Assessment Note    Patient Details  Name: Savannah Mcclure MRN: [id number] Date of Birth: May 26, 1941  Transition of Care [facility]) CM/SW Contact:    Beverly Sessions, RN Phone Number: 07/13/2021, 10:28 AM  Clinical Narrative:                 Patient admitted from home with sepsis Patient states she lives at home with daughter, son in law, and his son  PCP Wynetta Emery - patient drives her self to appointments Denies issues obtaining medications  Has home nocturnal O2, and cane Therapy recommending home health.  Patient agreeable and requesting [facility] Referral made to [facility] with [facility]   Expected Discharge Plan: [place] Barriers to Discharge: Barriers Resolved   Patient Goals and CMS Choice        Expected Discharge Plan and Services Expected Discharge Plan: [place]   Discharge Planning Services: CM Consult     Expected Discharge Date: 07/13/21                         HH Arranged: PT HH Agency: [place] Date [facility] Agency Contacted: 07/13/21   Representative spoke with at [place]: [place] Arrangements/Services     Patient language and need for interpreter reviewed:: Yes Do you feel safe going back to the place where you live?: Yes      Need for Family Participation in Patient Care: Yes (Comment) Care giver support system in place?: Yes (comment) Current home services: DME Criminal Activity/Legal Involvement Pertinent to Current Situation/Hospitalization: No - Comment as needed  Activities of Daily Living Home Assistive Devices/Equipment: CBG Meter ADL Screening (condition at time of admission) Patient's cognitive ability adequate to safely complete daily activities?: Yes Is the patient deaf or have difficulty hearing?: No Does the patient have difficulty seeing, even when wearing glasses/contacts?: Yes (no vision in right eye) Does the  patient have difficulty concentrating, remembering, or making decisions?: No Patient able to express need for assistance with ADLs?: No Does the patient have difficulty dressing or bathing?: No Independently performs ADLs?: Yes (appropriate for developmental age) Does the patient have difficulty walking or climbing stairs?: Yes Weakness of Legs: Both Weakness of Arms/Hands: Right  Permission Sought/Granted                  Emotional Assessment       Orientation: : Oriented to Self, Oriented to Place, Oriented to  Time, Oriented to Situation Alcohol / Substance Use: Not Applicable Psych Involvement: No (comment)  Admission diagnosis:  COPD exacerbation ([place]) [J44.1] Acute respiratory failure with hypoxia ([place]) [J96.01] Sepsis ([place]) [A41.9] Multifocal pneumonia [J18.9] Community acquired pneumonia, unspecified laterality [J18.9] Patient Active Problem List   Diagnosis Date Noted   Thrombocytosis    Multifocal pneumonia 07/11/2021   Hypomagnesemia    Sepsis ([place]) 07/10/2021   Senile purpura ([place]) 05/27/2021   Coronary artery disease of native artery of native heart with stable angina pectoris ([place]) 05/27/2021   Bronchiectasis without complication ([place]) 46/96/2952   Aortic atherosclerosis ([place]) 11/03/2020   Hypothyroidism 09/13/2020   Thoracic or lumbosacral neuritis or radiculitis, unspecified 02/25/2020   Atherosclerosis of native artery of both lower extremities with intermittent claudication ([place]) 10/15/2018   Spondylosis without myelopathy or radiculopathy, lumbar region 06/19/2018   Thoracic spondylosis without myelopathy 06/19/2018   Atherosclerosis of native arteries of extremity with intermittent claudication ([place]) 04/13/2018  Carotid stenosis 04/13/2018   Lumbar degenerative disc disease 12/19/2017   Chronic, continuous use of opioids 12/19/2017   Chronic left shoulder pain 12/19/2017   Chronic pain syndrome 12/19/2017   Idiopathic scoliosis and kyphoscoliosis  10/30/2017   PAD (peripheral artery disease) ([place]) 10/30/2017   Vision loss 10/30/2017   Sinus tachycardia 10/30/2017   Anxiety 10/30/2017   Anemia 10/30/2017   Hypokalemia 10/30/2017   CAP (community acquired pneumonia) 10/07/2017   Essential hypertension 10/07/2017   COPD exacerbation ([place]) 10/07/2017   Feeding problem in adult 07/25/2012   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   Hypoxemia 03/18/2012   Oliguria 03/18/2012   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   Degenerative joint disease 02/28/2012   Scoliosis 02/28/2012   History of tobacco abuse 07/25/2011   Dyslipidemia 07/25/2011   H/O chest pain 07/25/2011   Right bundle branch block 07/25/2011   PCP:  [name], DO Pharmacy:   [facility] [place], [place] [address] Phone: [phone] Fax: [place], [place] [address] Phone: [phone] Fax: [phone]  [facility] [address], [place] [address] Phone: [phone] Fax: [phone]     Social Determinants of Health ([place]) Interventions    Readmission Risk Interventions Readmission Risk Prevention Plan 07/13/2021  Transportation Screening Complete  Medication Review ([place]) Complete  HRI or [place] Complete  SW Recovery Care/Counseling Consult Complete  Palliative Care Screening Not [place] Not Applicable  Some recent data might be hidden

## 2021-07-13 NOTE — Discharge Summary (Signed)
[facility]: Savannah Mcclure    MR#:  [id number]  DATE OF BIRTH:  24-Feb-1941  DATE OF ADMISSION:  07/10/2021 ADMITTING PHYSICIAN: Savannah Grayer, MD  DATE OF DISCHARGE: 07/13/2021  2:11 PM  PRIMARY CARE PHYSICIAN: Savannah Roys, DO    ADMISSION DIAGNOSIS:  COPD exacerbation ([place]) [J44.1] Acute respiratory failure with hypoxia ([place]) [J96.01] Sepsis ([place]) [A41.9] Multifocal pneumonia [J18.9] Community acquired pneumonia, unspecified laterality [J18.9]  DISCHARGE DIAGNOSIS:  Principal Problem:   Sepsis ([place]) Active Problems:   CAP (community acquired pneumonia)   Essential hypertension   COPD exacerbation ([place])   History of tobacco abuse   PAD (peripheral artery disease) ([place])   Atherosclerosis of native arteries of extremity with intermittent claudication ([place])   Hypothyroidism   Coronary artery disease of native artery of native heart with stable angina pectoris ([place])   Multifocal pneumonia   Thrombocytosis   SECONDARY DIAGNOSIS:   Past Medical History:  Diagnosis Date   Abdominal distension 03/18/2012   Abnormal weight loss 10/30/2017   Acute kidney injury ([place]) 03/22/2012   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    COPD (chronic obstructive pulmonary disease) (HCC)    Delirium 03/18/2012   Dyspnea    GERD (gastroesophageal reflux disease)    Heart murmur    Hypertension    Leaky heart valve    Oxygen deficit    uses O2 at night 2 liters   Peripheral vascular disease ([place])    Scoliosis    Sepsis ([place])    Shock ([place]) 03/23/2012   SIRS (systemic inflammatory response syndrome) ([place]) 03/23/2012   Spinal stenosis    Stroke [facility])    Eye   Tachycardia     HOSPITAL COURSE:   Clinical sepsis, present on  admission.  Fever tachycardia tachypnea leukocytosis.  Multifocal pneumonia seen on x-ray.  With multiple drug allergies Levaquin is the only antibiotic that we will be able to give.  White blood cell count was 30.9 on presentation and went down to 15.8 but with steroids up at 19.4.  Recommend checking a CBC as an outpatient.  We will give 3 more days of Levaquin upon disposition (completing 7-day course).  Patient feeling better today than when she came in and felt ready to go home. 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  Patient had acute hypoxic respiratory failure on coming into the hospital but tapered off oxygen during the day.  Patient on oxygen 2 L at nighttime.  Off of oxygen during the day.  We did give Solu-Medrol during the hospital course.  A few more days of prednisone upon disposition. Hypomagnesemia and hypokalemia.  Replaced into the normal range. Essential hypertension on benazepril and atenolol.  Last 2 blood pressure is a little bit on the higher side. Depression on Lexapro Peripheral vascular disease on Pletal and niacin Neuropathy on gabapentin Hypothyroidism unspecified on Armour Thyroid Thrombocytosis secondary to infection Weakness.  Physical therapy recommended home with home health  DISCHARGE CONDITIONS:   Satisfactory  CONSULTS OBTAINED:  None  DRUG ALLERGIES:   Allergies  Allergen Reactions   Other Itching    Cat Gut sutures    Oxycodone Hives   Amlodipine Hives  Augmentin [Amoxicillin-Pot Clavulanate] Diarrhea   Azithromycin Hives   Contrast Media [Iodinated Diagnostic Agents] Hives   Doxycycline Diarrhea   Erythromycin Diarrhea   Hydrocodone Nausea Only   Lactose Intolerance (Gi) Diarrhea    Bloating   Minocycline Diarrhea   Nsaids Other (See Comments)    Internal Bleeding   Relafen [Nabumetone] Hives   Sulfa Antibiotics Hives   Synthroid [Levothyroxine] Diarrhea   Tetracyclines & Related Diarrhea   Tilactase    Tramadol Rash     Takes +/- Benadryl    DISCHARGE MEDICATIONS:   Allergies as of 07/13/2021       Reactions   Other Itching   Cat Gut sutures    Oxycodone Hives   Amlodipine Hives   Augmentin [amoxicillin-pot Clavulanate] Diarrhea   Azithromycin Hives   Contrast Media [iodinated Diagnostic Agents] Hives   Doxycycline Diarrhea   Erythromycin Diarrhea   Hydrocodone Nausea Only   Lactose Intolerance (gi) Diarrhea   Bloating   Minocycline Diarrhea   Nsaids Other (See Comments)   Internal Bleeding   Relafen [nabumetone] Hives   Sulfa Antibiotics Hives   Synthroid [levothyroxine] Diarrhea   Tetracyclines & Related Diarrhea   Tilactase    Tramadol Rash   Takes +/- Benadryl        Medication List     STOP taking these medications    UNABLE TO FIND   UNABLE TO FIND       TAKE these medications    acetaminophen 500 MG tablet Commonly known as: TYLENOL Take 500 mg by mouth every 6 (six) hours as needed.   albuterol 108 (90 Base) MCG/ACT inhaler Commonly known as: VENTOLIN HFA Inhale 2 puffs into the lungs every 6 (six) hours as needed for wheezing or shortness of breath.   atenolol 25 MG tablet Commonly known as: TENORMIN Take 1 tablet (25 mg total) by mouth daily.   B-COMPLEX-C PO Take 1 tablet by mouth daily.   benazepril 10 MG tablet Commonly known as: LOTENSIN Take 1 tablet (10 mg total) by mouth daily.   cilostazol 100 MG tablet Commonly known as: PLETAL Take 1 tablet (100 mg total) by mouth 2 (two) times daily.   cyclobenzaprine 5 MG tablet Commonly known as: FLEXERIL Take 5 mg by mouth 3 (three) times daily as needed for muscle spasms.   diphenhydrAMINE 25 MG tablet Commonly known as: BENADRYL Take 25 mg by mouth every 6 (six) hours as needed.   escitalopram 5 MG tablet Commonly known as: Lexapro 1/2 tab daily for 1 week, then increase to 1 tab daily   fluticasone 50 MCG/ACT nasal spray Commonly known as: FLONASE 2 sprays into each nostril in the  morning. Up to three sprays.   gabapentin 300 MG capsule Commonly known as: NEURONTIN Take 1 capsule (300 mg total) by mouth 2 (two) times daily.   hydrOXYzine 25 MG tablet Commonly known as: ATARAX Take 1 tablet (25 mg total) by mouth 3 (three) times daily as needed.   JOINT HEALTH PO Take by mouth daily.   levofloxacin 500 MG tablet Commonly known as: Levaquin Take 1 tablet (500 mg total) by mouth daily for 3 days. Start taking on: July 14, 2021   lidocaine 5 % Commonly known as: LIDODERM Place 1 patch onto the skin daily. Remove & Discard patch within 12 hours or as directed by MD   montelukast 10 MG tablet Commonly known as: SINGULAIR Take 1 tablet (10 mg total) by mouth at bedtime.   multivitamin with  minerals tablet Take 1 tablet by mouth daily.   NIACINAMIDE PO Take 1 capsule by mouth daily.   omeprazole 20 MG capsule Commonly known as: PRILOSEC Take 1 capsule (20 mg total) by mouth daily.   predniSONE 10 MG tablet Commonly known as: DELTASONE 4 tabs po daily for three days Start taking on: July 14, 2021   thyroid 60 MG tablet Commonly known as: Armour Thyroid Take 1 tablet (60 mg total) by mouth every other day. To be alternated with the 90mg  tab   thyroid 90 MG tablet Commonly known as: Armour Thyroid Take 1 tablet (90 mg total) by mouth every other day. To be alternated with the 60mg  tab   traMADol 50 MG tablet Commonly known as: ULTRAM Take 1 tablet (50 mg total) by mouth every 6 (six) hours as needed for severe pain.   Trelegy Ellipta 100-62.5-25 MCG/ACT Aepb Generic drug: Fluticasone-Umeclidin-Vilant Inhale 1 puff into the lungs daily.         DISCHARGE INSTRUCTIONS:   Follow-up PMD 5 days  If you experience worsening of your admission symptoms, develop shortness of breath, life threatening emergency, suicidal or homicidal thoughts you must seek medical attention immediately by calling 911 or calling your MD immediately  if  symptoms less severe.  You Must read complete instructions/literature along with all the possible adverse reactions/side effects for all the Medicines you take and that have been prescribed to you. Take any new Medicines after you have completely understood and accept all the possible adverse reactions/side effects.   Please note  You were cared for by a hospitalist during your hospital stay. If you have any questions about your discharge medications or the care you received while you were in the hospital after you are discharged, you can call the unit and asked to speak with the hospitalist on call if the hospitalist that took care of you is not available. Once you are discharged, your primary care physician will handle any further medical issues. Please note that NO REFILLS for any discharge medications will be authorized once you are discharged, as it is imperative that you return to your primary care physician (or establish a relationship with a primary care physician if you do not have one) for your aftercare needs so that they can reassess your need for medications and monitor your lab values.    Today   CHIEF COMPLAINT:   Chief Complaint  Patient presents with   Shortness of Breath   Cough    HISTORY OF PRESENT ILLNESS:  Savannah Mcclure  is a 80 y.o. female with a known history of came in with shortness of breath and cough admitted with sepsis and pneumonia   VITAL SIGNS:  Blood pressure 188/90, pulse 106, respirations 18, pulse ox 95% on room air  I/O:   Intake/Output Summary (Last 24 hours) at 07/13/2021 1629 Last data filed at 07/13/2021 1044 Gross per 24 hour  Intake 630 ml  Output 0 ml  Net 630 ml    PHYSICAL EXAMINATION:  GENERAL:  80 y.o.-year-old patient lying in the bed with no acute distress.  EYES: Pupils equal, round, reactive to light and accommodation. No scleral icterus.  HEENT: Head atraumatic, normocephalic. Oropharynx and nasopharynx clear.   LUNGS: Normal breath sounds bilaterally, no wheezing, rales,rhonchi or crepitation. No use of accessory muscles of respiration.  CARDIOVASCULAR: S1, S2 normal. No murmurs, rubs, or gallops.  ABDOMEN: Soft, non-tender, non-distended.  EXTREMITIES: No pedal edema.  NEUROLOGIC: Cranial nerves II through XII are  intact. Muscle strength 5/5 in all extremities. Sensation intact. Gait not checked.  PSYCHIATRIC: The patient is alert and oriented x 3.  SKIN: No obvious rash, lesion, or ulcer.   DATA REVIEW:   CBC Recent Labs  Lab 07/13/21 0524  WBC 19.4*  HGB 10.1*  HCT 29.8*  PLT 547*    Chemistries  Recent Labs  Lab 07/10/21 1201 07/11/21 0747 07/12/21 0533  NA 134*   < > 136  K 3.0*   < > 3.9  CL 98   < > 104  CO2 24   < > 25  GLUCOSE 127*   < > 137*  BUN 13   < > 11  CREATININE 0.67   < > 0.52  CALCIUM 8.5*   < > 8.1*  MG 1.4*   < > 2.6*  AST 26  --   --   ALT 18  --   --   ALKPHOS 83  --   --   BILITOT 1.4*  --   --    < > = values in this interval not displayed.     Microbiology Results  Results for orders placed or performed during the hospital encounter of 07/10/21  Culture, blood (Routine x 2)     Status: None (Preliminary result)   Collection Time: 07/10/21 12:01 PM   Specimen: BLOOD  Result Value Ref Range Status   Specimen Description BLOOD BLOOD LEFT FOREARM  Final   Special Requests   Final    BOTTLES DRAWN AEROBIC AND ANAEROBIC Blood Culture adequate volume   Culture   Final    NO GROWTH 3 DAYS Performed at [facility], [address] [place], [place] [address]    Report Status PENDING  Incomplete  Resp Panel by RT-PCR (Flu A&B, Covid) Nasopharyngeal Swab     Status: None   Collection Time: 07/10/21 12:01 PM   Specimen: Nasopharyngeal Swab; Nasopharyngeal(NP) swabs in vial transport medium  Result Value Ref Range Status   SARS Coronavirus 2 by RT PCR NEGATIVE NEGATIVE Final    Comment: (NOTE) SARS-CoV-2 target nucleic acids are NOT  DETECTED.  The SARS-CoV-2 RNA is generally detectable in upper respiratory specimens during the acute phase of infection. The lowest concentration of SARS-CoV-2 viral copies this assay can detect is 138 copies/mL. A negative result does not preclude SARS-Cov-2 infection and should not be used as the sole basis for treatment or other patient management decisions. A negative result may occur with  improper specimen collection/handling, submission of specimen other than nasopharyngeal swab, presence of viral mutation(s) within the areas targeted by this assay, and inadequate number of viral copies(<138 copies/mL). A negative result must be combined with clinical observations, patient history, and epidemiological information. The expected result is Negative.  Fact Sheet for Patients:  EntrepreneurPulse.com.au  Fact Sheet for Healthcare Providers:  IncredibleEmployment.be  This test is no t yet approved or cleared by the [place] FDA and  has been authorized for detection and/or diagnosis of SARS-CoV-2 by FDA under an Emergency Use Authorization (EUA). This EUA will remain  in effect (meaning this test can be used) for the duration of the COVID-19 declaration under Section 564(b)(1) of the Act, 21 U.S.C.section 360bbb-3(b)(1), unless the authorization is terminated  or revoked sooner.       Influenza A by PCR NEGATIVE NEGATIVE Final   Influenza B by PCR NEGATIVE NEGATIVE Final    Comment: (NOTE) The Xpert Xpress SARS-CoV-2/FLU/RSV plus assay is intended as an aid in  the diagnosis of influenza from Nasopharyngeal swab specimens and should not be used as a sole basis for treatment. Nasal washings and aspirates are unacceptable for Xpert Xpress SARS-CoV-2/FLU/RSV testing.  Fact Sheet for Patients: EntrepreneurPulse.com.au  Fact Sheet for Healthcare Providers: IncredibleEmployment.be  This test is not yet  approved or cleared by the [place] FDA and has been authorized for detection and/or diagnosis of SARS-CoV-2 by FDA under an Emergency Use Authorization (EUA). This EUA will remain in effect (meaning this test can be used) for the duration of the COVID-19 declaration under Section 564(b)(1) of the Act, 21 U.S.C. section 360bbb-3(b)(1), unless the authorization is terminated or revoked.  Performed at [facility], [address] [place], [place] [address]   Culture, blood (Routine x 2)     Status: None (Preliminary result)   Collection Time: 07/10/21  1:52 PM   Specimen: BLOOD  Result Value Ref Range Status   Specimen Description BLOOD BLOOD RIGHT FOREARM  Final   Special Requests   Final    BOTTLES DRAWN AEROBIC AND ANAEROBIC Blood Culture adequate volume   Culture   Final    NO GROWTH 3 DAYS Performed at [facility], [address], [place], [place] [address]    Report Status PENDING  Incomplete  Urine Culture     Status: Abnormal   Collection Time: 07/11/21  2:51 AM   Specimen: Urine, Clean Catch  Result Value Ref Range Status   Specimen Description   Final    URINE, CLEAN CATCH Performed at [facility], [address], [place], [place] [address]    Special Requests   Final    NONE Performed at [facility], [address] [place], [place] [address]    Culture (A)  Final    <10,000 COLONIES/mL INSIGNIFICANT GROWTH Performed at [facility], [place] [address] [place], [place] [address]    Report Status 07/12/2021 FINAL  Final      Management plans discussed with the patient, and she is in agreement.  CODE STATUS:     Code Status Orders  (From admission, onward)           Start     Ordered   07/10/21 1406  Full code  Continuous        07/10/21 1407           Code Status History     Date Active Date Inactive Code Status Order ID Comments User Context   02/03/2020 1230 02/03/2020 2149 Full Code  [id number]  Algernon Huxley, MD Inpatient   10/08/2017 0037 10/13/2017 1941 Full Code [id number]  Lance Coon, MD Inpatient   08/25/2017 1459 08/27/2017 1747 Full Code [id number]  Savannah Grayer, MD ED       TOTAL TIME TAKING CARE OF THIS PATIENT: 34 minutes.    Savannah Mcclure M.D on 07/13/2021 at 4:29 PM    [facility]  CC: Primary care physician; Savannah Roys, DO

## 2021-07-13 NOTE — Plan of Care (Signed)

## 2021-07-15 LAB — CULTURE, BLOOD (ROUTINE X 2)
Culture: NO GROWTH
Culture: NO GROWTH
Special Requests: ADEQUATE
Special Requests: ADEQUATE

## 2021-07-16 DIAGNOSIS — F419 Anxiety disorder, unspecified: Secondary | ICD-10-CM | POA: Diagnosis not present

## 2021-07-16 DIAGNOSIS — A419 Sepsis, unspecified organism: Secondary | ICD-10-CM | POA: Diagnosis not present

## 2021-07-16 DIAGNOSIS — J479 Bronchiectasis, uncomplicated: Secondary | ICD-10-CM | POA: Diagnosis not present

## 2021-07-16 DIAGNOSIS — F17200 Nicotine dependence, unspecified, uncomplicated: Secondary | ICD-10-CM | POA: Diagnosis not present

## 2021-07-16 DIAGNOSIS — J9601 Acute respiratory failure with hypoxia: Secondary | ICD-10-CM | POA: Diagnosis not present

## 2021-07-16 DIAGNOSIS — D692 Other nonthrombocytopenic purpura: Secondary | ICD-10-CM | POA: Diagnosis not present

## 2021-07-16 DIAGNOSIS — E039 Hypothyroidism, unspecified: Secondary | ICD-10-CM | POA: Diagnosis not present

## 2021-07-16 DIAGNOSIS — I70219 Atherosclerosis of native arteries of extremities with intermittent claudication, unspecified extremity: Secondary | ICD-10-CM | POA: Diagnosis not present

## 2021-07-16 DIAGNOSIS — J189 Pneumonia, unspecified organism: Secondary | ICD-10-CM | POA: Diagnosis not present

## 2021-07-16 DIAGNOSIS — I10 Essential (primary) hypertension: Secondary | ICD-10-CM | POA: Diagnosis not present

## 2021-07-16 DIAGNOSIS — Z90710 Acquired absence of both cervix and uterus: Secondary | ICD-10-CM | POA: Diagnosis not present

## 2021-07-16 DIAGNOSIS — Z9012 Acquired absence of left breast and nipple: Secondary | ICD-10-CM | POA: Diagnosis not present

## 2021-07-16 DIAGNOSIS — I25119 Atherosclerotic heart disease of native coronary artery with unspecified angina pectoris: Secondary | ICD-10-CM | POA: Diagnosis not present

## 2021-07-16 DIAGNOSIS — K219 Gastro-esophageal reflux disease without esophagitis: Secondary | ICD-10-CM | POA: Diagnosis not present

## 2021-07-16 DIAGNOSIS — E785 Hyperlipidemia, unspecified: Secondary | ICD-10-CM | POA: Diagnosis not present

## 2021-07-16 DIAGNOSIS — F32A Depression, unspecified: Secondary | ICD-10-CM | POA: Diagnosis not present

## 2021-07-16 DIAGNOSIS — J441 Chronic obstructive pulmonary disease with (acute) exacerbation: Secondary | ICD-10-CM | POA: Diagnosis not present

## 2021-07-16 DIAGNOSIS — Z8673 Personal history of transient ischemic attack (TIA), and cerebral infarction without residual deficits: Secondary | ICD-10-CM | POA: Diagnosis not present

## 2021-07-16 DIAGNOSIS — J440 Chronic obstructive pulmonary disease with acute lower respiratory infection: Secondary | ICD-10-CM | POA: Diagnosis not present

## 2021-07-16 DIAGNOSIS — D75839 Thrombocytosis, unspecified: Secondary | ICD-10-CM | POA: Diagnosis not present

## 2021-07-20 ENCOUNTER — Ambulatory Visit (INDEPENDENT_AMBULATORY_CARE_PROVIDER_SITE_OTHER): Payer: Medicare Other | Admitting: Family Medicine

## 2021-07-20 ENCOUNTER — Encounter: Payer: Self-pay | Admitting: Family Medicine

## 2021-07-20 ENCOUNTER — Other Ambulatory Visit: Payer: Self-pay

## 2021-07-20 VITALS — BP 108/68 | HR 106 | Temp 97.7°F | Wt 103.0 lb

## 2021-07-20 DIAGNOSIS — J9611 Chronic respiratory failure with hypoxia: Secondary | ICD-10-CM | POA: Diagnosis not present

## 2021-07-20 DIAGNOSIS — D75839 Thrombocytosis, unspecified: Secondary | ICD-10-CM | POA: Diagnosis not present

## 2021-07-20 DIAGNOSIS — E876 Hypokalemia: Secondary | ICD-10-CM | POA: Diagnosis not present

## 2021-07-20 DIAGNOSIS — J441 Chronic obstructive pulmonary disease with (acute) exacerbation: Secondary | ICD-10-CM

## 2021-07-20 DIAGNOSIS — J189 Pneumonia, unspecified organism: Secondary | ICD-10-CM | POA: Diagnosis not present

## 2021-07-20 DIAGNOSIS — D72829 Elevated white blood cell count, unspecified: Secondary | ICD-10-CM | POA: Diagnosis not present

## 2021-07-20 MED ORDER — ALBUTEROL SULFATE (2.5 MG/3ML) 0.083% IN NEBU: 2.5000 mg | INHALATION_SOLUTION | Freq: Once | RESPIRATORY_TRACT | Status: DC

## 2021-07-20 MED ORDER — TRIAMCINOLONE ACETONIDE 40 MG/ML IJ SUSP
80.0000 mg | Freq: Once | INTRAMUSCULAR | Status: AC
Start: 2021-07-20 — End: 2021-07-20
  Administered 2021-07-20: 80 mg via INTRAMUSCULAR

## 2021-07-20 MED ORDER — PREDNISONE 10 MG PO TABS: ORAL_TABLET | ORAL | 0 refills | Status: DC

## 2021-07-20 NOTE — Assessment & Plan Note (Signed)
Will treat with prednisone taper. Continue to monitor. Call with any concerns.

## 2021-07-20 NOTE — Assessment & Plan Note (Signed)
Rechecking labs today. Await results.  

## 2021-07-20 NOTE — Progress Notes (Signed)
BP 108/68   Pulse (!) 106   Temp 97.7 F (36.5 C)   Wt 103 lb (46.7 kg)   SpO2 96%   BMI 19.46 kg/m    Subjective:    Patient ID: Savannah Mcclure, female    DOB: 01-23-1941, 80 y.o.   MRN: [id number]  HPI: Savannah Mcclure is a 80 y.o. female  Chief Complaint  Patient presents with   Hospitalization Follow-up    Patient was admitted to hospital with shortness of breath, and fever. Patient was diagnosed with pneumonia.    Transition of Care Hospital Follow up.   Hospital/Facility: [facility] D/C Physician: Dr. Earleen Newport D/C Date: 07/13/21  Records Requested: 07/20/21 Records Received: 07/20/21 Records Reviewed: 07/20/21  Diagnoses on Discharge: COPD exacerbation ([place]) [J44.1] Acute respiratory failure with hypoxia ([place]) [J96.01] Sepsis ([place]) [A41.9] Multifocal pneumonia [J18.9] Community acquired pneumonia, unspecified laterality [J18.9]  Date of interactive Contact within 48 hours of discharge: 07/13/21 Contact was through: direct  Date of 7 day or 14 day face-to-face visit:  07/20/21  within 7 days  Outpatient Encounter Medications as of 07/20/2021  Medication Sig   acetaminophen (TYLENOL) 500 MG tablet Take 500 mg by mouth every 6 (six) hours as needed.   albuterol (VENTOLIN HFA) 108 (90 Base) MCG/ACT inhaler Inhale 2 puffs into the lungs every 6 (six) hours as needed for wheezing or shortness of breath.   atenolol (TENORMIN) 25 MG tablet Take 1 tablet (25 mg total) by mouth daily.   B-COMPLEX-C PO Take 1 tablet by mouth daily.   benazepril (LOTENSIN) 10 MG tablet Take 1 tablet (10 mg total) by mouth daily.   cilostazol (PLETAL) 100 MG tablet Take 1 tablet (100 mg total) by mouth 2 (two) times daily.   cyclobenzaprine (FLEXERIL) 5 MG tablet Take 5 mg by mouth 3 (three) times daily as needed for muscle spasms.    diphenhydrAMINE (BENADRYL) 25 MG tablet Take 25 mg by mouth every 6 (six) hours as needed.    escitalopram (LEXAPRO) 5 MG tablet 1/2 tab daily for  1 week, then increase to 1 tab daily   fluticasone (FLONASE) 50 MCG/ACT nasal spray 2 sprays into each nostril in the morning. Up to three sprays.   Fluticasone-Umeclidin-Vilant (TRELEGY ELLIPTA) 100-62.5-25 MCG/INH AEPB Inhale 1 puff into the lungs daily.   gabapentin (NEURONTIN) 300 MG capsule Take 1 capsule (300 mg total) by mouth 2 (two) times daily.   hydrOXYzine (ATARAX/VISTARIL) 25 MG tablet Take 1 tablet (25 mg total) by mouth 3 (three) times daily as needed.   lidocaine (LIDODERM) 5 % Place 1 patch onto the skin daily. Remove & Discard patch within 12 hours or as directed by MD   Misc Natural Products ([place]) Take by mouth daily.   montelukast (SINGULAIR) 10 MG tablet Take 1 tablet (10 mg total) by mouth at bedtime.   Multiple Vitamins-Minerals (MULTIVITAMIN WITH MINERALS) tablet Take 1 tablet by mouth daily.   NIACINAMIDE PO Take 1 capsule by mouth daily.   omeprazole (PRILOSEC) 20 MG capsule Take 1 capsule (20 mg total) by mouth daily.   predniSONE (DELTASONE) 10 MG tablet 6 tabs days 1 and 2 in the AM, 5 tabs the next 2 days, decrease by 1 every other day until gone   thyroid (ARMOUR THYROID) 60 MG tablet Take 1 tablet (60 mg total) by mouth every other day. To be alternated with the 90mg  tab   thyroid (ARMOUR THYROID) 90 MG tablet Take 1 tablet (90 mg total)  by mouth every other day. To be alternated with the 60mg  tab   traMADol (ULTRAM) 50 MG tablet Take 1 tablet (50 mg total) by mouth every 6 (six) hours as needed for severe pain.   [DISCONTINUED] predniSONE (DELTASONE) 10 MG tablet 4 tabs po daily for three days (Patient not taking: Reported on 07/20/2021)   Facility-Administered Encounter Medications as of 07/20/2021  Medication   albuterol (PROVENTIL) (2.5 MG/3ML) 0.083% nebulizer solution 2.5 mg   [COMPLETED] triamcinolone acetonide (KENALOG-40) injection 80 mg  Per Hospitalist:" HOSPITAL COURSE:    Clinical sepsis, present on admission.  Fever tachycardia tachypnea  leukocytosis.  Multifocal pneumonia seen on x-ray.  With multiple drug allergies Levaquin is the only antibiotic that we will be able to give.  White blood cell count was 30.9 on presentation and went down to 15.8 but with steroids up at 19.4.  Recommend checking a CBC as an outpatient.  We will give 3 more days of Levaquin upon disposition (completing 7-day course).  Patient feeling better today than when she came in and felt ready to go home. 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  Patient had acute hypoxic respiratory failure on coming into the hospital but tapered off oxygen during the day.  Patient on oxygen 2 L at nighttime.  Off of oxygen during the day.  We did give Solu-Medrol during the hospital course.  A few more days of prednisone upon disposition. Hypomagnesemia and hypokalemia.  Replaced into the normal range. Essential hypertension on benazepril and atenolol.  Last 2 blood pressure is a little bit on the higher side. Depression on Lexapro Peripheral vascular disease on Pletal and niacin Neuropathy on gabapentin Hypothyroidism unspecified on Armour Thyroid Thrombocytosis secondary to infection Weakness.  Physical therapy recommended home with home health"  Diagnostic Tests Reviewed CLINICAL DATA:  Suspected sepsis, shortness of breath and productive cough for 1 week, recent pneumonia, COPD, on O2 at night   EXAM: CHEST - 2 VIEW   COMPARISON:  12/07/2020   FINDINGS: Normal heart size, mediastinal contours, and pulmonary vascularity.   Atherosclerotic calcification aorta.   Emphysematous changes with patchy infiltrates throughout both lungs consistent with pneumonia.   Minimal LEFT pleural effusion.   No pneumothorax.   Marked levoconvex thoracolumbar scoliosis.   IMPRESSION: Patchy BILATERAL pulmonary infiltrates consistent with pneumonia.   Underlying COPD with minimal LEFT pleural effusion.   Aortic Atherosclerosis (ICD10-I70.0) and Emphysema  (ICD10-J43.9).  Disposition: Home  Consults: None  Discharge Instructions: Follow up here.   Disease/illness Education: Discussed today  Home Health/Community Services Discussions/Referrals: In place  Establishment or re-establishment of referral orders for community resources: In place  Discussion with other health care providers:None  Assessment and Support of treatment regimen adherence: [place] with: Patient  Education for self-management, independent living, and ADLs: Discussed today  [name] notes that she has not been doing well. She doesn't feel like she should have been sent home when she was. She notes that she has been having issues with SOB and coughing. She feels like she has rattling in her chest and is concerned that she has pneumonia again. She notes that she has been using her inhaler, but not her nebulizer. She has been having cold sweats. She finished her prednisone and levaquin. She notes that she doesn't feel good.   Relevant past medical, surgical, family and social history reviewed and updated as indicated. Interim medical history since our last visit reviewed. Allergies and medications reviewed and updated.  Review of Systems  Constitutional:  Positive for chills, diaphoresis and fatigue. Negative for activity change, appetite change, fever and unexpected weight change.  HENT: Negative.    Eyes: Negative.   Respiratory:  Positive for cough, chest tightness, shortness of breath and wheezing. Negative for apnea, choking and stridor.   Cardiovascular: Negative.   Gastrointestinal: Negative.   Musculoskeletal: Negative.   Neurological: Negative.   Psychiatric/Behavioral: Negative.     Per HPI unless specifically indicated above     Objective:    BP 108/68   Pulse (!) 106   Temp 97.7 F (36.5 C)   Wt 103 lb (46.7 kg)   SpO2 96%   BMI 19.46 kg/m   Wt Readings from Last 3 Encounters:  07/20/21 103 lb (46.7 kg)  07/10/21 110 lb  (49.9 kg)  05/27/21 109 lb (49.4 kg)    Physical Exam Vitals and nursing note reviewed.  Constitutional:      General: She is not in acute distress.    Appearance: Normal appearance. She is not ill-appearing, toxic-appearing or diaphoretic.  HENT:     Head: Normocephalic and atraumatic.     Right Ear: External ear normal.     Left Ear: External ear normal.     Nose: Nose normal.     Mouth/Throat:     Mouth: Mucous membranes are moist.     Pharynx: Oropharynx is clear.  Eyes:     General: No scleral icterus.       Right eye: No discharge.        Left eye: No discharge.     Extraocular Movements: Extraocular movements intact.     Conjunctiva/sclera: Conjunctivae normal.     Pupils: Pupils are equal, round, and reactive to light.  Cardiovascular:     Rate and Rhythm: Normal rate and regular rhythm.     Pulses: Normal pulses.     Heart sounds: Normal heart sounds. No murmur heard.   No friction rub. No gallop.  Pulmonary:     Effort: No respiratory distress.     Breath sounds: No stridor. Wheezing and rhonchi present. No rales.  Chest:     Chest wall: No tenderness.  Musculoskeletal:        General: Normal range of motion.     Cervical back: Normal range of motion and neck supple.  Skin:    General: Skin is warm and dry.     Capillary Refill: Capillary refill takes less than 2 seconds.     Coloration: Skin is not jaundiced or pale.     Findings: No bruising, erythema, lesion or rash.  Neurological:     General: No focal deficit present.     Mental Status: She is alert and oriented to person, place, and time. Mental status is at baseline.  Psychiatric:        Mood and Affect: Mood normal.        Behavior: Behavior normal.        Thought Content: Thought content normal.        Judgment: Judgment normal.    Results for orders placed or performed during the hospital encounter of 07/10/21  Culture, blood (Routine x 2)   Specimen: BLOOD  Result Value Ref Range   Specimen  Description BLOOD BLOOD LEFT FOREARM    Special Requests      BOTTLES DRAWN AEROBIC AND ANAEROBIC Blood Culture adequate volume   Culture      NO GROWTH 5 DAYS Performed at [facility], [place],  [place], [place] [address]    Report Status 07/15/2021 FINAL   Culture, blood (Routine x 2)   Specimen: BLOOD  Result Value Ref Range   Specimen Description BLOOD BLOOD RIGHT FOREARM    Special Requests      BOTTLES DRAWN AEROBIC AND ANAEROBIC Blood Culture adequate volume   Culture      NO GROWTH 5 DAYS Performed at [facility], [address] [place], [place] [address]    Report Status 07/15/2021 FINAL   Resp Panel by RT-PCR (Flu A&B, Covid) Nasopharyngeal Swab   Specimen: Nasopharyngeal Swab; Nasopharyngeal(NP) swabs in vial transport medium  Result Value Ref Range   SARS Coronavirus 2 by RT PCR NEGATIVE NEGATIVE   Influenza A by PCR NEGATIVE NEGATIVE   Influenza B by PCR NEGATIVE NEGATIVE  Urine Culture   Specimen: Urine, Clean Catch  Result Value Ref Range   Specimen Description      URINE, CLEAN CATCH Performed at [facility], [address] [place], [place] [address]    Special Requests      NONE Performed at [facility], [address] [place], [place] [address]    Culture (A)     <10,000 COLONIES/mL INSIGNIFICANT GROWTH Performed at [facility] [address] [place], [place] [address]    Report Status 07/12/2021 FINAL   Comprehensive metabolic panel  Result Value Ref Range   Sodium 134 (L) 135 - 145 mmol/L   Potassium 3.0 (L) 3.5 - 5.1 mmol/L   Chloride 98 98 - 111 mmol/L   CO2 24 22 - 32 mmol/L   Glucose, Bld 127 (H) 70 - 99 mg/dL   BUN 13 8 - 23 mg/dL   Creatinine, Ser 0.67 0.44 - 1.00 mg/dL   Calcium 8.5 (L) 8.9 - 10.3 mg/dL   Total Protein 7.9 6.5 - 8.1 g/dL   Albumin 3.4 (L) 3.5 - 5.0 g/dL   AST 26 15 - 41 U/L   ALT 18 0 - 44 U/L   Alkaline Phosphatase 83 38 - 126 U/L   Total Bilirubin 1.4 (H)  0.3 - 1.2 mg/dL   GFR, Estimated >60 >60 mL/min   Anion gap 12 5 - 15  Lactic acid, plasma  Result Value Ref Range   Lactic Acid, Venous 1.6 0.5 - 1.9 mmol/L  Lactic acid, plasma  Result Value Ref Range   Lactic Acid, Venous 1.6 0.5 - 1.9 mmol/L  CBC with Differential  Result Value Ref Range   WBC 30.9 (H) 4.0 - 10.5 K/uL   RBC 4.10 3.87 - 5.11 MIL/uL   Hemoglobin 12.6 12.0 - 15.0 g/dL   HCT 37.3 36.0 - 46.0 %   MCV 91.0 80.0 - 100.0 fL   MCH 30.7 26.0 - 34.0 pg   MCHC 33.8 30.0 - 36.0 g/dL   RDW 13.5 11.5 - 15.5 %   Platelets 539 (H) 150 - 400 K/uL   nRBC 0.0 0.0 - 0.2 %   Neutrophils Relative % 93 %   Neutro Abs 28.7 (H) 1.7 - 7.7 K/uL   Lymphocytes Relative 2 %   Lymphs Abs 0.7 0.7 - 4.0 K/uL   Monocytes Relative 4 %   Monocytes Absolute 1.1 (H) 0.1 - 1.0 K/uL   Eosinophils Relative 0 %   Eosinophils Absolute 0.1 0.0 - 0.5 K/uL   Basophils Relative 0 %   Basophils Absolute 0.1 0.0 - 0.1 K/uL   RBC Morphology MORPHOLOGY UNREMARKABLE    Smear Review PLATELETS APPEAR  INCREASED    Immature Granulocytes 1 %   Abs Immature Granulocytes 0.25 (H) 0.00 - 0.07 K/uL   Reactive, Benign Lymphocytes PRESENT   Protime-INR  Result Value Ref Range   Prothrombin Time 16.3 (H) 11.4 - 15.2 seconds   INR 1.3 (H) 0.8 - 1.2  Urinalysis, Routine w reflex microscopic  Result Value Ref Range   Color, Urine YELLOW YELLOW   APPearance CLEAR (A) CLEAR   Specific Gravity, Urine >1.030 (H) 1.005 - 1.030   pH 5.5 5.0 - 8.0   Glucose, UA NEGATIVE NEGATIVE mg/dL   Hgb urine dipstick LARGE (A) NEGATIVE   Bilirubin Urine SMALL (A) NEGATIVE   Ketones, ur 15 (A) NEGATIVE mg/dL   Protein, ur 100 (A) NEGATIVE mg/dL   Nitrite POSITIVE (A) NEGATIVE   Leukocytes,Ua NEGATIVE NEGATIVE   RBC / HPF 21-50 0 - 5 RBC/hpf   WBC, UA 11-20 0 - 5 WBC/hpf   Bacteria, UA MANY (A) NONE SEEN   Squamous Epithelial / LPF 6-10 0 - 5   WBC Clumps PRESENT    Mucus PRESENT    Hyaline Casts, UA PRESENT    Amorphous  Crystal PRESENT   Magnesium  Result Value Ref Range   Magnesium 1.4 (L) 1.7 - 2.4 mg/dL  Protime-INR  Result Value Ref Range   Prothrombin Time 16.8 (H) 11.4 - 15.2 seconds   INR 1.4 (H) 0.8 - 1.2  Cortisol-am, blood  Result Value Ref Range   Cortisol - AM 19.9 6.7 - 22.6 ug/dL  Procalcitonin  Result Value Ref Range   Procalcitonin 0.49 ng/mL  Basic metabolic panel  Result Value Ref Range   Sodium 135 135 - 145 mmol/L   Potassium 3.1 (L) 3.5 - 5.1 mmol/L   Chloride 103 98 - 111 mmol/L   CO2 27 22 - 32 mmol/L   Glucose, Bld 88 70 - 99 mg/dL   BUN 8 8 - 23 mg/dL   Creatinine, Ser 0.52 0.44 - 1.00 mg/dL   Calcium 7.9 (L) 8.9 - 10.3 mg/dL   GFR, Estimated >60 >60 mL/min   Anion gap 5 5 - 15  CBC  Result Value Ref Range   WBC 17.6 (H) 4.0 - 10.5 K/uL   RBC 3.38 (L) 3.87 - 5.11 MIL/uL   Hemoglobin 10.1 (L) 12.0 - 15.0 g/dL   HCT 30.2 (L) 36.0 - 46.0 %   MCV 89.3 80.0 - 100.0 fL   MCH 29.9 26.0 - 34.0 pg   MCHC 33.4 30.0 - 36.0 g/dL   RDW 13.5 11.5 - 15.5 %   Platelets 401 (H) 150 - 400 K/uL   nRBC 0.0 0.0 - 0.2 %  Magnesium  Result Value Ref Range   Magnesium 1.1 (L) 1.7 - 2.4 mg/dL  CBC  Result Value Ref Range   WBC 15.8 (H) 4.0 - 10.5 K/uL   RBC 3.42 (L) 3.87 - 5.11 MIL/uL   Hemoglobin 10.2 (L) 12.0 - 15.0 g/dL   HCT 30.5 (L) 36.0 - 46.0 %   MCV 89.2 80.0 - 100.0 fL   MCH 29.8 26.0 - 34.0 pg   MCHC 33.4 30.0 - 36.0 g/dL   RDW 13.6 11.5 - 15.5 %   Platelets 499 (H) 150 - 400 K/uL   nRBC 0.0 0.0 - 0.2 %  Basic metabolic panel  Result Value Ref Range   Sodium 136 135 - 145 mmol/L   Potassium 3.9 3.5 - 5.1 mmol/L   Chloride 104 98 - 111 mmol/L  CO2 25 22 - 32 mmol/L   Glucose, Bld 137 (H) 70 - 99 mg/dL   BUN 11 8 - 23 mg/dL   Creatinine, Ser 0.52 0.44 - 1.00 mg/dL   Calcium 8.1 (L) 8.9 - 10.3 mg/dL   GFR, Estimated >60 >60 mL/min   Anion gap 7 5 - 15  Magnesium  Result Value Ref Range   Magnesium 2.6 (H) 1.7 - 2.4 mg/dL  CBC  Result Value Ref Range   WBC  19.4 (H) 4.0 - 10.5 K/uL   RBC 3.29 (L) 3.87 - 5.11 MIL/uL   Hemoglobin 10.1 (L) 12.0 - 15.0 g/dL   HCT 29.8 (L) 36.0 - 46.0 %   MCV 90.6 80.0 - 100.0 fL   MCH 30.7 26.0 - 34.0 pg   MCHC 33.9 30.0 - 36.0 g/dL   RDW 13.7 11.5 - 15.5 %   Platelets 547 (H) 150 - 400 K/uL   nRBC 0.0 0.0 - 0.2 %      Assessment & Plan:   Problem List Items Addressed This Visit       Respiratory   COPD exacerbation ([place]) - Primary    Will treat with steroid taper. Recheck in 3 days. Continue to monitor closely.      Relevant Medications   albuterol (PROVENTIL) (2.5 MG/3ML) 0.083% nebulizer solution 2.5 mg   predniSONE (DELTASONE) 10 MG tablet   Other Relevant Orders   DG Chest 2 View   Multifocal pneumonia    Lungs clear after neb. Concern for COPD exacerbation rather than pneumonia- will get x-ray. Await results. If need be- start abx.       Relevant Medications   albuterol (PROVENTIL) (2.5 MG/3ML) 0.083% nebulizer solution 2.5 mg   Other Relevant Orders   DG Chest 2 View   Chronic respiratory failure with hypoxia ([place])    Will treat with prednisone taper. Continue to monitor. Call with any concerns.         Hematopoietic and Hemostatic   Thrombocytosis    Rechecking labs today. Await results.       Relevant Orders   CBC with Differential/Platelet     Other   Hypokalemia    Rechecking labs today. Await results.       Relevant Orders   Basic metabolic panel   Hypomagnesemia    Rechecking labs today. Await results.       Relevant Orders   Magnesium   Other Visit Diagnoses     Leukocytosis, unspecified type       Rechecking labs today. Await results.    Relevant Orders   CBC with Differential/Platelet        Follow up plan: Return Friday, lung recheck.

## 2021-07-20 NOTE — Assessment & Plan Note (Signed)
Will treat with steroid taper. Recheck in 3 days. Continue to monitor closely.

## 2021-07-20 NOTE — Assessment & Plan Note (Signed)
Lungs clear after neb. Concern for COPD exacerbation rather than pneumonia- will get x-ray. Await results. If need be- start abx.

## 2021-07-21 LAB — CBC WITH DIFFERENTIAL/PLATELET
Basophils Absolute: 0.1 10*3/uL (ref 0.0–0.2)
Basos: 0 %
EOS (ABSOLUTE): 0.1 10*3/uL (ref 0.0–0.4)
Eos: 1 %
Hematocrit: 42.8 % (ref 34.0–46.6)
Hemoglobin: 14.7 g/dL (ref 11.1–15.9)
Immature Grans (Abs): 0.2 10*3/uL — ABNORMAL HIGH (ref 0.0–0.1)
Immature Granulocytes: 1 %
Lymphocytes Absolute: 3.5 10*3/uL — ABNORMAL HIGH (ref 0.7–3.1)
Lymphs: 18 %
MCH: 31.1 pg (ref 26.6–33.0)
MCHC: 34.3 g/dL (ref 31.5–35.7)
MCV: 91 fL (ref 79–97)
Monocytes Absolute: 1.2 10*3/uL — ABNORMAL HIGH (ref 0.1–0.9)
Monocytes: 6 %
Neutrophils Absolute: 14.3 10*3/uL — ABNORMAL HIGH (ref 1.4–7.0)
Neutrophils: 74 %
Platelets: 704 10*3/uL — ABNORMAL HIGH (ref 150–450)
RBC: 4.72 x10E6/uL (ref 3.77–5.28)
RDW: 13.9 % (ref 11.7–15.4)
WBC: 19.4 10*3/uL — ABNORMAL HIGH (ref 3.4–10.8)

## 2021-07-21 LAB — BASIC METABOLIC PANEL
BUN/Creatinine Ratio: 20 (ref 12–28)
BUN: 18 mg/dL (ref 8–27)
CO2: 25 mmol/L (ref 20–29)
Calcium: 9.6 mg/dL (ref 8.7–10.3)
Chloride: 95 mmol/L — ABNORMAL LOW (ref 96–106)
Creatinine, Ser: 0.91 mg/dL (ref 0.57–1.00)
Glucose: 120 mg/dL — ABNORMAL HIGH (ref 70–99)
Potassium: 5 mmol/L (ref 3.5–5.2)
Sodium: 138 mmol/L (ref 134–144)
eGFR: 63 mL/min/{1.73_m2} (ref >59)

## 2021-07-21 LAB — MAGNESIUM: Magnesium: 2.2 mg/dL (ref 1.6–2.3)

## 2021-07-22 ENCOUNTER — Other Ambulatory Visit: Payer: Self-pay

## 2021-07-22 ENCOUNTER — Ambulatory Visit
Admission: RE | Admit: 2021-07-22 | Discharge: 2021-07-22 | Disposition: A | Discharge status: Home / Self Care | Payer: Medicare Other | Source: Ambulatory Visit | Attending: Family Medicine | Admitting: Family Medicine

## 2021-07-22 ENCOUNTER — Ambulatory Visit
Admission: RE | Admit: 2021-07-22 | Discharge: 2021-07-22 | Disposition: A | Discharge status: Home / Self Care | Payer: Medicare Other | Attending: Family Medicine | Admitting: Family Medicine

## 2021-07-22 DIAGNOSIS — J189 Pneumonia, unspecified organism: Secondary | ICD-10-CM | POA: Insufficient documentation

## 2021-07-22 DIAGNOSIS — J479 Bronchiectasis, uncomplicated: Secondary | ICD-10-CM | POA: Diagnosis not present

## 2021-07-22 DIAGNOSIS — A419 Sepsis, unspecified organism: Secondary | ICD-10-CM | POA: Diagnosis not present

## 2021-07-22 DIAGNOSIS — J441 Chronic obstructive pulmonary disease with (acute) exacerbation: Secondary | ICD-10-CM | POA: Insufficient documentation

## 2021-07-22 DIAGNOSIS — R0600 Dyspnea, unspecified: Secondary | ICD-10-CM | POA: Diagnosis not present

## 2021-07-22 DIAGNOSIS — R531 Weakness: Secondary | ICD-10-CM | POA: Diagnosis not present

## 2021-07-22 DIAGNOSIS — J439 Emphysema, unspecified: Secondary | ICD-10-CM | POA: Diagnosis not present

## 2021-07-22 DIAGNOSIS — J440 Chronic obstructive pulmonary disease with acute lower respiratory infection: Secondary | ICD-10-CM | POA: Diagnosis not present

## 2021-07-22 DIAGNOSIS — J9601 Acute respiratory failure with hypoxia: Secondary | ICD-10-CM | POA: Diagnosis not present

## 2021-07-23 ENCOUNTER — Encounter: Payer: Self-pay | Admitting: Family Medicine

## 2021-07-23 ENCOUNTER — Other Ambulatory Visit: Payer: Self-pay

## 2021-07-23 ENCOUNTER — Ambulatory Visit (INDEPENDENT_AMBULATORY_CARE_PROVIDER_SITE_OTHER): Payer: Medicare Other | Admitting: Family Medicine

## 2021-07-23 VITALS — BP 151/84 | HR 103 | Temp 98.7°F | Wt 105.6 lb

## 2021-07-23 DIAGNOSIS — I6523 Occlusion and stenosis of bilateral carotid arteries: Secondary | ICD-10-CM

## 2021-07-23 DIAGNOSIS — J441 Chronic obstructive pulmonary disease with (acute) exacerbation: Secondary | ICD-10-CM

## 2021-07-23 NOTE — Patient Instructions (Addendum)
Pulmonology [facility] January 17 @ 1:30 PM

## 2021-07-23 NOTE — Progress Notes (Signed)
BP (!) 151/84   Pulse (!) 103   Temp 98.7 F (37.1 C)   Wt 105 lb 9.6 oz (47.9 kg)   SpO2 94%   BMI 19.95 kg/m    Subjective:    Patient ID: Savannah Mcclure, female    DOB: Aug 15, 1941, 80 y.o.   MRN: [id number]  HPI: Savannah Mcclure is a 80 y.o. female  Chief Complaint  Patient presents with   COPD   Feeling better. Still SOB. Still having some cough and wheezing. Tolerating her medicine well. No other concerns.   Relevant past medical, surgical, family and social history reviewed and updated as indicated. Interim medical history since our last visit reviewed. Allergies and medications reviewed and updated.  Review of Systems  Constitutional: Negative.   HENT: Negative.    Respiratory:  Positive for cough, shortness of breath and wheezing. Negative for apnea, choking, chest tightness and stridor.   Cardiovascular: Negative.   Gastrointestinal: Negative.   Psychiatric/Behavioral: Negative.     Per HPI unless specifically indicated above     Objective:    BP (!) 151/84   Pulse (!) 103   Temp 98.7 F (37.1 C)   Wt 105 lb 9.6 oz (47.9 kg)   SpO2 94%   BMI 19.95 kg/m   Wt Readings from Last 3 Encounters:  07/23/21 105 lb 9.6 oz (47.9 kg)  07/20/21 103 lb (46.7 kg)  07/10/21 110 lb (49.9 kg)    Physical Exam Vitals and nursing note reviewed.  Constitutional:      General: She is not in acute distress.    Appearance: Normal appearance. She is not ill-appearing, toxic-appearing or diaphoretic.  HENT:     Head: Normocephalic and atraumatic.     Right Ear: External ear normal.     Left Ear: External ear normal.     Nose: Nose normal.     Mouth/Throat:     Mouth: Mucous membranes are moist.     Pharynx: Oropharynx is clear.  Eyes:     General: No scleral icterus.       Right eye: No discharge.        Left eye: No discharge.     Extraocular Movements: Extraocular movements intact.     Conjunctiva/sclera: Conjunctivae normal.     Pupils:  Pupils are equal, round, and reactive to light.  Cardiovascular:     Rate and Rhythm: Normal rate and regular rhythm.     Pulses: Normal pulses.     Heart sounds: Normal heart sounds. No murmur heard.   No friction rub. No gallop.  Pulmonary:     Effort: Pulmonary effort is normal. No respiratory distress.     Breath sounds: No stridor. No wheezing, rhonchi or rales.     Comments: Decreased breath sounds bilaterally Chest:     Chest wall: No tenderness.  Musculoskeletal:        General: Normal range of motion.     Cervical back: Normal range of motion and neck supple.  Skin:    General: Skin is warm and dry.     Capillary Refill: Capillary refill takes less than 2 seconds.     Coloration: Skin is not jaundiced or pale.     Findings: No bruising, erythema, lesion or rash.  Neurological:     General: No focal deficit present.     Mental Status: She is alert and oriented to person, place, and time. Mental status is at baseline.  Psychiatric:  Mood and Affect: Mood normal.        Behavior: Behavior normal.        Thought Content: Thought content normal.        Judgment: Judgment normal.    Results for orders placed or performed in visit on 02/77/41  Basic metabolic panel  Result Value Ref Range   Glucose 120 (H) 70 - 99 mg/dL   BUN 18 8 - 27 mg/dL   Creatinine, Ser 0.91 0.57 - 1.00 mg/dL   eGFR 63 >59 mL/min/1.73   BUN/Creatinine Ratio 20 12 - 28   Sodium 138 134 - 144 mmol/L   Potassium 5.0 3.5 - 5.2 mmol/L   Chloride 95 (L) 96 - 106 mmol/L   CO2 25 20 - 29 mmol/L   Calcium 9.6 8.7 - 10.3 mg/dL  CBC with Differential/Platelet  Result Value Ref Range   WBC 19.4 (H) 3.4 - 10.8 x10E3/uL   RBC 4.72 3.77 - 5.28 x10E6/uL   Hemoglobin 14.7 11.1 - 15.9 g/dL   Hematocrit 42.8 34.0 - 46.6 %   MCV 91 79 - 97 fL   MCH 31.1 26.6 - 33.0 pg   MCHC 34.3 31.5 - 35.7 g/dL   RDW 13.9 11.7 - 15.4 %   Platelets 704 (H) 150 - 450 x10E3/uL   Neutrophils 74 Not Estab. %   Lymphs 18  Not Estab. %   Monocytes 6 Not Estab. %   Eos 1 Not Estab. %   Basos 0 Not Estab. %   Neutrophils Absolute 14.3 (H) 1.4 - 7.0 x10E3/uL   Lymphocytes Absolute 3.5 (H) 0.7 - 3.1 x10E3/uL   Monocytes Absolute 1.2 (H) 0.1 - 0.9 x10E3/uL   EOS (ABSOLUTE) 0.1 0.0 - 0.4 x10E3/uL   Basophils Absolute 0.1 0.0 - 0.2 x10E3/uL   Immature Granulocytes 1 Not Estab. %   Immature Grans (Abs) 0.2 (H) 0.0 - 0.1 x10E3/uL  Magnesium  Result Value Ref Range   Magnesium 2.2 1.6 - 2.3 mg/dL      Assessment & Plan:   Problem List Items Addressed This Visit       Respiratory   COPD exacerbation ([place]) - Primary    Doing much better. Continue current regimen. Got her a follow up with pulmonology. Recheck 2-3 weeks. Call with any concerns.         Follow up plan: Return in about 2 weeks (around 08/06/2021).

## 2021-07-23 NOTE — Assessment & Plan Note (Signed)
Doing much better. Continue current regimen. Got her a follow up with pulmonology. Recheck 2-3 weeks. Call with any concerns.

## 2021-08-04 DIAGNOSIS — J9601 Acute respiratory failure with hypoxia: Secondary | ICD-10-CM | POA: Diagnosis not present

## 2021-08-04 DIAGNOSIS — J441 Chronic obstructive pulmonary disease with (acute) exacerbation: Secondary | ICD-10-CM | POA: Diagnosis not present

## 2021-08-04 DIAGNOSIS — J189 Pneumonia, unspecified organism: Secondary | ICD-10-CM | POA: Diagnosis not present

## 2021-08-04 DIAGNOSIS — A419 Sepsis, unspecified organism: Secondary | ICD-10-CM | POA: Diagnosis not present

## 2021-08-04 DIAGNOSIS — J479 Bronchiectasis, uncomplicated: Secondary | ICD-10-CM | POA: Diagnosis not present

## 2021-08-04 DIAGNOSIS — J440 Chronic obstructive pulmonary disease with acute lower respiratory infection: Secondary | ICD-10-CM | POA: Diagnosis not present

## 2021-08-05 ENCOUNTER — Other Ambulatory Visit: Payer: Self-pay

## 2021-08-05 ENCOUNTER — Encounter: Payer: Self-pay | Admitting: Family Medicine

## 2021-08-05 ENCOUNTER — Ambulatory Visit (INDEPENDENT_AMBULATORY_CARE_PROVIDER_SITE_OTHER): Payer: Medicare Other | Admitting: Family Medicine

## 2021-08-05 VITALS — BP 94/59 | HR 92 | Temp 98.5°F | Wt 105.0 lb

## 2021-08-05 DIAGNOSIS — J441 Chronic obstructive pulmonary disease with (acute) exacerbation: Secondary | ICD-10-CM | POA: Diagnosis not present

## 2021-08-05 DIAGNOSIS — I6523 Occlusion and stenosis of bilateral carotid arteries: Secondary | ICD-10-CM | POA: Diagnosis not present

## 2021-08-05 NOTE — Progress Notes (Signed)
BP (!) 94/59    Pulse 92    Temp 98.5 F (36.9 C)    Wt 105 lb (47.6 kg)    SpO2 96%    BMI 19.84 kg/m    Subjective:    Patient ID: Savannah Mcclure, female    DOB: 1941-05-31, 80 y.o.   MRN: [id number]  HPI: Savannah Mcclure is a 80 y.o. female  Chief Complaint  Patient presents with   COPD    Patient states she is feeling better   COPD COPD status: stable Satisfied with current treatment?: yes Oxygen use: no Dyspnea frequency: occasionally Cough frequency: few times a day Rescue inhaler frequency:  rarely Limitation of activity: no Productive cough: no Pneumovax: Up to Date Influenza: Up to Date  Relevant past medical, surgical, family and social history reviewed and updated as indicated. Interim medical history since our last visit reviewed. Allergies and medications reviewed and updated.  Review of Systems  Constitutional: Negative.   Respiratory:  Positive for cough. Negative for apnea, choking, chest tightness, shortness of breath, wheezing and stridor.   Cardiovascular: Negative.   Gastrointestinal: Negative.   Musculoskeletal: Negative.   Neurological: Negative.   Psychiatric/Behavioral: Negative.     Per HPI unless specifically indicated above     Objective:    BP (!) 94/59    Pulse 92    Temp 98.5 F (36.9 C)    Wt 105 lb (47.6 kg)    SpO2 96%    BMI 19.84 kg/m   Wt Readings from Last 3 Encounters:  08/05/21 105 lb (47.6 kg)  07/23/21 105 lb 9.6 oz (47.9 kg)  07/20/21 103 lb (46.7 kg)    Physical Exam Vitals and nursing note reviewed.  Constitutional:      General: She is not in acute distress.    Appearance: Normal appearance. She is not ill-appearing, toxic-appearing or diaphoretic.  HENT:     Head: Normocephalic and atraumatic.     Right Ear: External ear normal.     Left Ear: External ear normal.     Nose: Nose normal.     Mouth/Throat:     Mouth: Mucous membranes are moist.     Pharynx: Oropharynx is clear.  Eyes:      General: No scleral icterus.       Right eye: No discharge.        Left eye: No discharge.     Extraocular Movements: Extraocular movements intact.     Conjunctiva/sclera: Conjunctivae normal.     Pupils: Pupils are equal, round, and reactive to light.  Cardiovascular:     Rate and Rhythm: Normal rate and regular rhythm.     Pulses: Normal pulses.     Heart sounds: Normal heart sounds. No murmur heard.   No friction rub. No gallop.  Pulmonary:     Effort: Pulmonary effort is normal. No respiratory distress.     Breath sounds: Normal breath sounds. No stridor. No wheezing, rhonchi or rales.  Chest:     Chest wall: No tenderness.  Musculoskeletal:        General: Normal range of motion.     Cervical back: Normal range of motion and neck supple.  Skin:    General: Skin is warm and dry.     Capillary Refill: Capillary refill takes less than 2 seconds.     Coloration: Skin is not jaundiced or pale.     Findings: No bruising, erythema, lesion or rash.  Neurological:  General: No focal deficit present.     Mental Status: She is alert and oriented to person, place, and time. Mental status is at baseline.  Psychiatric:        Mood and Affect: Mood normal.        Behavior: Behavior normal.        Thought Content: Thought content normal.        Judgment: Judgment normal.    Results for orders placed or performed in visit on 28/97/91  Basic metabolic panel  Result Value Ref Range   Glucose 120 (H) 70 - 99 mg/dL   BUN 18 8 - 27 mg/dL   Creatinine, Ser 0.91 0.57 - 1.00 mg/dL   eGFR 63 >59 mL/min/1.73   BUN/Creatinine Ratio 20 12 - 28   Sodium 138 134 - 144 mmol/L   Potassium 5.0 3.5 - 5.2 mmol/L   Chloride 95 (L) 96 - 106 mmol/L   CO2 25 20 - 29 mmol/L   Calcium 9.6 8.7 - 10.3 mg/dL  CBC with Differential/Platelet  Result Value Ref Range   WBC 19.4 (H) 3.4 - 10.8 x10E3/uL   RBC 4.72 3.77 - 5.28 x10E6/uL   Hemoglobin 14.7 11.1 - 15.9 g/dL   Hematocrit 42.8 34.0 - 46.6 %    MCV 91 79 - 97 fL   MCH 31.1 26.6 - 33.0 pg   MCHC 34.3 31.5 - 35.7 g/dL   RDW 13.9 11.7 - 15.4 %   Platelets 704 (H) 150 - 450 x10E3/uL   Neutrophils 74 Not Estab. %   Lymphs 18 Not Estab. %   Monocytes 6 Not Estab. %   Eos 1 Not Estab. %   Basos 0 Not Estab. %   Neutrophils Absolute 14.3 (H) 1.4 - 7.0 x10E3/uL   Lymphocytes Absolute 3.5 (H) 0.7 - 3.1 x10E3/uL   Monocytes Absolute 1.2 (H) 0.1 - 0.9 x10E3/uL   EOS (ABSOLUTE) 0.1 0.0 - 0.4 x10E3/uL   Basophils Absolute 0.1 0.0 - 0.2 x10E3/uL   Immature Granulocytes 1 Not Estab. %   Immature Grans (Abs) 0.2 (H) 0.0 - 0.1 x10E3/uL  Magnesium  Result Value Ref Range   Magnesium 2.2 1.6 - 2.3 mg/dL      Assessment & Plan:   Problem List Items Addressed This Visit       Respiratory   COPD exacerbation ([place]) - Primary    Resolved. Lungs clear. Continue current regimen. Call with any concerns. Continue to monitor.         Follow up plan: Return as scheduled.

## 2021-08-05 NOTE — Assessment & Plan Note (Signed)
Resolved. Lungs clear. Continue current regimen. Call with any concerns. Continue to monitor.

## 2021-08-10 DIAGNOSIS — J189 Pneumonia, unspecified organism: Secondary | ICD-10-CM | POA: Diagnosis not present

## 2021-08-10 DIAGNOSIS — J479 Bronchiectasis, uncomplicated: Secondary | ICD-10-CM | POA: Diagnosis not present

## 2021-08-10 DIAGNOSIS — A419 Sepsis, unspecified organism: Secondary | ICD-10-CM | POA: Diagnosis not present

## 2021-08-10 DIAGNOSIS — J9601 Acute respiratory failure with hypoxia: Secondary | ICD-10-CM | POA: Diagnosis not present

## 2021-08-10 DIAGNOSIS — J440 Chronic obstructive pulmonary disease with acute lower respiratory infection: Secondary | ICD-10-CM | POA: Diagnosis not present

## 2021-08-10 DIAGNOSIS — J441 Chronic obstructive pulmonary disease with (acute) exacerbation: Secondary | ICD-10-CM | POA: Diagnosis not present

## 2021-08-11 ENCOUNTER — Telehealth: Payer: Self-pay | Admitting: Family Medicine

## 2021-08-11 NOTE — Telephone Encounter (Signed)
Returned call. Advised that this patient is not one of Dr. Ivy Lynn

## 2021-08-11 NOTE — Telephone Encounter (Signed)
crystal from [facility] called regarding pw that was faxed over to [facility]. please call [phone] to inform if fax was received. please advise.

## 2021-08-15 DIAGNOSIS — J441 Chronic obstructive pulmonary disease with (acute) exacerbation: Secondary | ICD-10-CM | POA: Diagnosis not present

## 2021-08-15 DIAGNOSIS — Z90710 Acquired absence of both cervix and uterus: Secondary | ICD-10-CM | POA: Diagnosis not present

## 2021-08-15 DIAGNOSIS — E785 Hyperlipidemia, unspecified: Secondary | ICD-10-CM | POA: Diagnosis not present

## 2021-08-15 DIAGNOSIS — Z8673 Personal history of transient ischemic attack (TIA), and cerebral infarction without residual deficits: Secondary | ICD-10-CM | POA: Diagnosis not present

## 2021-08-15 DIAGNOSIS — J440 Chronic obstructive pulmonary disease with acute lower respiratory infection: Secondary | ICD-10-CM | POA: Diagnosis not present

## 2021-08-15 DIAGNOSIS — Z9012 Acquired absence of left breast and nipple: Secondary | ICD-10-CM | POA: Diagnosis not present

## 2021-08-15 DIAGNOSIS — D75839 Thrombocytosis, unspecified: Secondary | ICD-10-CM | POA: Diagnosis not present

## 2021-08-15 DIAGNOSIS — J189 Pneumonia, unspecified organism: Secondary | ICD-10-CM | POA: Diagnosis not present

## 2021-08-15 DIAGNOSIS — I10 Essential (primary) hypertension: Secondary | ICD-10-CM | POA: Diagnosis not present

## 2021-08-15 DIAGNOSIS — I70219 Atherosclerosis of native arteries of extremities with intermittent claudication, unspecified extremity: Secondary | ICD-10-CM | POA: Diagnosis not present

## 2021-08-15 DIAGNOSIS — F17200 Nicotine dependence, unspecified, uncomplicated: Secondary | ICD-10-CM | POA: Diagnosis not present

## 2021-08-15 DIAGNOSIS — J9601 Acute respiratory failure with hypoxia: Secondary | ICD-10-CM | POA: Diagnosis not present

## 2021-08-15 DIAGNOSIS — I25119 Atherosclerotic heart disease of native coronary artery with unspecified angina pectoris: Secondary | ICD-10-CM | POA: Diagnosis not present

## 2021-08-15 DIAGNOSIS — F32A Depression, unspecified: Secondary | ICD-10-CM | POA: Diagnosis not present

## 2021-08-15 DIAGNOSIS — F419 Anxiety disorder, unspecified: Secondary | ICD-10-CM | POA: Diagnosis not present

## 2021-08-15 DIAGNOSIS — E039 Hypothyroidism, unspecified: Secondary | ICD-10-CM | POA: Diagnosis not present

## 2021-08-15 DIAGNOSIS — J479 Bronchiectasis, uncomplicated: Secondary | ICD-10-CM | POA: Diagnosis not present

## 2021-08-15 DIAGNOSIS — D692 Other nonthrombocytopenic purpura: Secondary | ICD-10-CM | POA: Diagnosis not present

## 2021-08-15 DIAGNOSIS — A419 Sepsis, unspecified organism: Secondary | ICD-10-CM | POA: Diagnosis not present

## 2021-08-15 DIAGNOSIS — K219 Gastro-esophageal reflux disease without esophagitis: Secondary | ICD-10-CM | POA: Diagnosis not present

## 2021-08-17 DIAGNOSIS — J479 Bronchiectasis, uncomplicated: Secondary | ICD-10-CM | POA: Diagnosis not present

## 2021-08-17 DIAGNOSIS — A419 Sepsis, unspecified organism: Secondary | ICD-10-CM | POA: Diagnosis not present

## 2021-08-17 DIAGNOSIS — J441 Chronic obstructive pulmonary disease with (acute) exacerbation: Secondary | ICD-10-CM | POA: Diagnosis not present

## 2021-08-17 DIAGNOSIS — J9601 Acute respiratory failure with hypoxia: Secondary | ICD-10-CM | POA: Diagnosis not present

## 2021-08-17 DIAGNOSIS — J440 Chronic obstructive pulmonary disease with acute lower respiratory infection: Secondary | ICD-10-CM | POA: Diagnosis not present

## 2021-08-17 DIAGNOSIS — J189 Pneumonia, unspecified organism: Secondary | ICD-10-CM | POA: Diagnosis not present

## 2021-08-18 ENCOUNTER — Telehealth: Payer: Self-pay | Admitting: Family Medicine

## 2021-08-18 NOTE — Telephone Encounter (Signed)
Ok for verbal orders ?

## 2021-08-18 NOTE — Telephone Encounter (Signed)
Copied from [place] [id number]. Topic: Quick Communication - Home Health Verbal Orders >> Aug 17, 2021  4:10 PM Pawlus, Brayton Layman A wrote: Caller/Agency: [facility] Callback Number: [phone] Requesting: PT and to be set up with a social worker regarding difficulties with living with her daughter.   Frequency: PT 1x4, then every other week for the next 4 weeks.

## 2021-08-18 NOTE — Telephone Encounter (Signed)
Called [facility] and made aware of verbal orders.

## 2021-08-31 ENCOUNTER — Other Ambulatory Visit: Payer: Self-pay

## 2021-08-31 ENCOUNTER — Ambulatory Visit: Payer: Medicare Other | Admitting: Pulmonary Disease

## 2021-08-31 ENCOUNTER — Encounter: Payer: Self-pay | Admitting: Pulmonary Disease

## 2021-08-31 VITALS — BP 140/80 | HR 124 | Temp 98.2°F | Ht 61.0 in | Wt 105.8 lb

## 2021-08-31 DIAGNOSIS — F17210 Nicotine dependence, cigarettes, uncomplicated: Secondary | ICD-10-CM

## 2021-08-31 DIAGNOSIS — J3089 Other allergic rhinitis: Secondary | ICD-10-CM

## 2021-08-31 DIAGNOSIS — J449 Chronic obstructive pulmonary disease, unspecified: Secondary | ICD-10-CM

## 2021-08-31 DIAGNOSIS — M4124 Other idiopathic scoliosis, thoracic region: Secondary | ICD-10-CM

## 2021-08-31 NOTE — Progress Notes (Signed)
Subjective:    Patient ID: Savannah Mcclure, female    DOB: 09-14-1940, 81 y.o.   MRN: [id number]  Patient Care Team: Dorcas Carrow, DO as PCP - General (Family Medicine) Geanie Logan, MD as Referring Physician (Otolaryngology)  Chief Complaint  Patient presents with   Follow-up   HPI The patient is an 81 year old current smoker (half PPD) who presents for follow-up of potential chronic lung infection as noted on chest CT. we initially evaluated the patient on an May 2022.  The concerning findings were noted on incidental imaging ordered on a visit of 22 October 2020.  At that time the patient had the concern that her breastbone was caving in.  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  During at that time showed right upper lobe changes that could be secondary to bronchiectasis with mucus impaction.  CT was obtained on 04 November 2020 which showed findings consistent with potential MAI.  The patient has been asymptomatic with regards to this.  She has a chronic cough however she continues to smoke.  She has not had any change in the character of her cough.  She did receive antibiotics which were followed by another chest x-ray performed on 07 December 2020 that showed persistence of the findings though perhaps somewhat better.  MAI tends to be waxing and waning.  The patient does have significant gastroesophageal reflux symptoms and does not follow adequate antireflux measures.  She has not had any fevers, chills or sweats.  No hemoptysis.  She has not had any orthopnea, paroxysmal nocturnal dyspnea or lower extremity edema. Patient does not describe any weight loss or anorexia.  She does have issues with chronic nasal congestion and sinus drainage.  Is a chronic issue for her.   She does not follow allergen avoidance.  She has 10 cats and 1 dog in the home. She enjoys gardening.    Review of Systems A 10 point review of systems was performed and it is as noted  above otherwise negative.  Patient Active Problem List   Diagnosis Date Noted   Chronic respiratory failure with hypoxia (HCC) 07/20/2021   Thrombocytosis    Multifocal pneumonia 07/11/2021   Hypomagnesemia    Sepsis (HCC) 07/10/2021   Senile purpura (HCC) 05/27/2021   Coronary artery disease of native artery of native heart with stable angina pectoris (HCC) 05/27/2021   Aortic atherosclerosis (HCC) 11/03/2020   Hypothyroidism 09/13/2020   Thoracic or lumbosacral neuritis or radiculitis, unspecified 02/25/2020   Atherosclerosis of native artery of both lower extremities with intermittent claudication (HCC) 10/15/2018   Spondylosis without myelopathy or radiculopathy, lumbar region 06/19/2018   Thoracic spondylosis without myelopathy 06/19/2018   Atherosclerosis of native arteries of extremity with intermittent claudication (HCC) 04/13/2018   Carotid stenosis 04/13/2018   Lumbar degenerative disc disease 12/19/2017   Chronic, continuous use of opioids 12/19/2017   Chronic left shoulder pain 12/19/2017   Chronic pain syndrome 12/19/2017   Idiopathic scoliosis and kyphoscoliosis 10/30/2017   PAD (peripheral artery disease) (HCC) 10/30/2017   Vision loss 10/30/2017   Sinus tachycardia 10/30/2017   Anxiety 10/30/2017   Anemia 10/30/2017   Hypokalemia 10/30/2017   Essential hypertension 10/07/2017   COPD exacerbation (HCC) 10/07/2017   Feeding problem in adult 07/25/2012   Gastrostomy in place [facility]) 07/25/2012   Acute respiratory failure with hypoxia (HCC) 03/22/2012   Hypoxemia 03/18/2012   Oliguria 03/18/2012   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  Degenerative joint disease 02/28/2012   Scoliosis 02/28/2012   History of tobacco abuse 07/25/2011   Dyslipidemia 07/25/2011   H/O chest pain 07/25/2011   Right bundle branch block 07/25/2011   Social History   Tobacco Use   Smoking status: Every Day    Packs/day: 0.50    Years: 40.00     Pack years: 20.00    Types: Cigarettes   Smokeless tobacco: Never   Tobacco comments:    0.5PPD 12/22/20  Substance Use Topics   Alcohol use: Yes    Comment: wine on occasion (1x/month)    Allergies  Allergen Reactions   Other Itching    Cat Gut sutures    Oxycodone Hives   Amlodipine Hives   Augmentin [Amoxicillin-Pot Clavulanate] Diarrhea   Azithromycin Hives   Contrast Media [Iodinated Contrast Media] Hives   Doxycycline Diarrhea   Erythromycin Diarrhea   Hydrocodone Nausea Only   Lactose Intolerance (Gi) Diarrhea    Bloating   Minocycline Diarrhea   Nsaids Other (See Comments)    Internal Bleeding   Relafen [Nabumetone] Hives   Sulfa Antibiotics Hives   Synthroid [Levothyroxine] Diarrhea   Tetracyclines & Related Diarrhea   Tilactase    Tramadol Rash    Takes +/- Benadryl   Current Meds  Medication Sig   acetaminophen (TYLENOL) 500 MG tablet Take 500 mg by mouth every 6 (six) hours as needed.   albuterol (VENTOLIN HFA) 108 (90 Base) MCG/ACT inhaler Inhale 2 puffs into the lungs every 6 (six) hours as needed for wheezing or shortness of breath.   atenolol (TENORMIN) 25 MG tablet Take 1 tablet (25 mg total) by mouth daily.   B-COMPLEX-C PO Take 1 tablet by mouth daily.   benazepril (LOTENSIN) 10 MG tablet Take 1 tablet (10 mg total) by mouth daily.   cilostazol (PLETAL) 100 MG tablet Take 1 tablet (100 mg total) by mouth 2 (two) times daily.   cyclobenzaprine (FLEXERIL) 5 MG tablet Take 5 mg by mouth 3 (three) times daily as needed for muscle spasms.    diphenhydrAMINE (BENADRYL) 25 MG tablet Take 25 mg by mouth every 6 (six) hours as needed.    escitalopram (LEXAPRO) 5 MG tablet 1/2 tab daily for 1 week, then increase to 1 tab daily   fluticasone (FLONASE) 50 MCG/ACT nasal spray 2 sprays into each nostril in the morning. Up to three sprays.   Fluticasone-Umeclidin-Vilant (TRELEGY ELLIPTA) 100-62.5-25 MCG/INH AEPB Inhale 1 puff into the lungs daily.   gabapentin  (NEURONTIN) 300 MG capsule Take 1 capsule (300 mg total) by mouth 2 (two) times daily.   hydrOXYzine (ATARAX/VISTARIL) 25 MG tablet Take 1 tablet (25 mg total) by mouth 3 (three) times daily as needed.   lidocaine (LIDODERM) 5 % Place 1 patch onto the skin daily. Remove & Discard patch within 12 hours or as directed by MD   Misc Natural Products (JOINT HEALTH PO) Take by mouth daily.   montelukast (SINGULAIR) 10 MG tablet Take 1 tablet (10 mg total) by mouth at bedtime.   Multiple Vitamins-Minerals (MULTIVITAMIN WITH MINERALS) tablet Take 1 tablet by mouth daily.   NIACINAMIDE PO Take 1 capsule by mouth daily.   omeprazole (PRILOSEC) 20 MG capsule Take 1 capsule (20 mg total) by mouth daily.   predniSONE (DELTASONE) 10 MG tablet 6 tabs days 1 and 2 in the AM, 5 tabs the next 2 days, decrease by 1 every other day until gone   thyroid (ARMOUR THYROID) 60 MG tablet Take 1  tablet (60 mg total) by mouth every other day. To be alternated with the 90mg  tab   thyroid (ARMOUR THYROID) 90 MG tablet Take 1 tablet (90 mg total) by mouth every other day. To be alternated with the 60mg  tab   Current Facility-Administered Medications for the 08/31/21 encounter (Office Visit) with Salena Saner, MD  Medication   albuterol (PROVENTIL) (2.5 MG/3ML) 0.083% nebulizer solution 2.5 mg     Immunization History  Administered Date(s) Administered   Fluad Quad(high Dose 65+) 04/25/2019, 06/01/2020, 05/27/2021   Influenza, High Dose Seasonal PF 04/23/2018   Influenza-Unspecified 06/15/2017, 04/23/2018   PFIZER Comirnaty(Gray Top)Covid-19 Tri-Sucrose Vaccine 12/08/2020   PFIZER(Purple Top)SARS-COV-2 Vaccination 08/22/2019, 09/14/2019, 05/04/2020   Pfizer Covid-19 Vaccine Bivalent Booster 69yrs & up 06/08/2021   Pneumococcal Conjugate-13 09/06/2018   Pneumococcal Polysaccharide-23 03/09/2020   Td 12/12/2017      Objective:   Physical Exam BP 140/80 (BP Location: Left Arm, Patient Position: Sitting, Cuff  Size: Normal)   Pulse (!) 124   Temp 98.2 F (36.8 C) (Oral)   Ht 5\' 1"  (1.549 m)   Wt 105 lb 12.8 oz (48 kg)   SpO2 96%   BMI 19.99 kg/m   SpO2: 96 % O2 Device: None (Room air)  GENERAL: Thin well-developed woman, no acute distress.  No conversational dyspnea. HEAD: Normocephalic, atraumatic.  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  No scleral icterus.  MOUTH: Nose/mouth/throat not examined due to masking requirements for COVID 19. NECK: Supple. No thyromegaly. Trachea midline. No JVD.  No adenopathy. PULMONARY: Good air entry bilaterally.  Coarse otherwise, no adventitious sounds. CARDIOVASCULAR: S1 and S2.  Cardiac with regular rhythm.  No rubs, murmurs or gallops heard. ABDOMEN: Benign. MUSCULOSKELETAL: Thoracic dextroscoliosis, no clubbing, no edema.  NEUROLOGIC: No focal deficit, no gait disturbance, speech is fluent. SKIN: Intact,warm,dry.  On limited exam no rashes or skin lesions. PSYCH: Mood and behavior normal.      Assessment & Plan:     ICD-10-CM   1. Stage 2 moderate COPD by GOLD classification (HCC)  J44.9    Continue Trelegy Ellipta Continue as needed albuterol    2. Chronic non-seasonal allergic rhinitis  J30.89    Nasal hygiene Recommend NeilMed nasal rinses    3. Other idiopathic scoliosis, thoracic region  M41.24    This issue adds complexity to her management    4. Tobacco dependence due to cigarettes  F17.210    Patient counseled regards to discontinuation of smoking Total counseling time 3 to 5 minutes     Will continue to follow her chest CT findings expectantly.  He has "aged out" of lung cancer screening program.  Will see her in follow-up in 3 months time she is to contact us prior to that time should any new difficulties arise.  Gailen Shelter, MD Advanced Bronchoscopy PCCM [facility]    *This note was dictated using voice recognition software/Dragon.  Despite best efforts to proofread, errors can  occur which can change the meaning. Any transcriptional errors that result from this process are unintentional and may not be fully corrected at the time of dictation.

## 2021-08-31 NOTE — Patient Instructions (Signed)
Please try to quit smoking.  Continue the Trelegy and as needed albuterol.   I recommend that you have try some nasal saline irrigation below is an example of irrigation that can be done with a bottle called NeilMed that comes as a kit and is readily available.  Use distilled water for the solution.  I think this will help reduce your recurrent issues in your lungs by avoiding infections in your sinuses.   We will see you in follow-up in 3 months time call sooner should any new problems arise.

## 2021-09-01 DIAGNOSIS — J440 Chronic obstructive pulmonary disease with acute lower respiratory infection: Secondary | ICD-10-CM | POA: Diagnosis not present

## 2021-09-01 DIAGNOSIS — J479 Bronchiectasis, uncomplicated: Secondary | ICD-10-CM | POA: Diagnosis not present

## 2021-09-01 DIAGNOSIS — J189 Pneumonia, unspecified organism: Secondary | ICD-10-CM | POA: Diagnosis not present

## 2021-09-01 DIAGNOSIS — J9601 Acute respiratory failure with hypoxia: Secondary | ICD-10-CM | POA: Diagnosis not present

## 2021-09-01 DIAGNOSIS — J441 Chronic obstructive pulmonary disease with (acute) exacerbation: Secondary | ICD-10-CM | POA: Diagnosis not present

## 2021-09-01 DIAGNOSIS — A419 Sepsis, unspecified organism: Secondary | ICD-10-CM | POA: Diagnosis not present

## 2021-09-10 DIAGNOSIS — J441 Chronic obstructive pulmonary disease with (acute) exacerbation: Secondary | ICD-10-CM | POA: Diagnosis not present

## 2021-09-10 DIAGNOSIS — J440 Chronic obstructive pulmonary disease with acute lower respiratory infection: Secondary | ICD-10-CM | POA: Diagnosis not present

## 2021-09-10 DIAGNOSIS — J479 Bronchiectasis, uncomplicated: Secondary | ICD-10-CM | POA: Diagnosis not present

## 2021-09-10 DIAGNOSIS — J189 Pneumonia, unspecified organism: Secondary | ICD-10-CM | POA: Diagnosis not present

## 2021-09-10 DIAGNOSIS — J9601 Acute respiratory failure with hypoxia: Secondary | ICD-10-CM | POA: Diagnosis not present

## 2021-09-10 DIAGNOSIS — A419 Sepsis, unspecified organism: Secondary | ICD-10-CM | POA: Diagnosis not present

## 2021-09-17 ENCOUNTER — Telehealth: Payer: Self-pay | Admitting: Family Medicine

## 2021-09-17 NOTE — Telephone Encounter (Signed)
Copied from [place] [phone]. Topic: Medicare AWV >> Sep 17, 2021  3:14 PM Lavonia Drafts wrote: Left message for patient to call back and schedule the Medicare Annual Wellness Visit (AWV) virtually or by telephone.  Last AWV 03/09/20  Please schedule at anytime with [facility]-Nurse Health Advisor.  45 minute appointment  Any questions, please call me at [phone]

## 2021-09-23 DIAGNOSIS — Z86018 Personal history of other benign neoplasm: Secondary | ICD-10-CM | POA: Diagnosis not present

## 2021-09-23 DIAGNOSIS — L570 Actinic keratosis: Secondary | ICD-10-CM | POA: Diagnosis not present

## 2021-09-23 DIAGNOSIS — L821 Other seborrheic keratosis: Secondary | ICD-10-CM | POA: Diagnosis not present

## 2021-09-23 DIAGNOSIS — L578 Other skin changes due to chronic exposure to nonionizing radiation: Secondary | ICD-10-CM | POA: Diagnosis not present

## 2021-09-23 DIAGNOSIS — Z872 Personal history of diseases of the skin and subcutaneous tissue: Secondary | ICD-10-CM | POA: Diagnosis not present

## 2021-09-23 DIAGNOSIS — D225 Melanocytic nevi of trunk: Secondary | ICD-10-CM | POA: Diagnosis not present

## 2021-09-23 DIAGNOSIS — D485 Neoplasm of uncertain behavior of skin: Secondary | ICD-10-CM | POA: Diagnosis not present

## 2021-09-23 DIAGNOSIS — L720 Epidermal cyst: Secondary | ICD-10-CM | POA: Diagnosis not present

## 2021-09-29 ENCOUNTER — Ambulatory Visit (INDEPENDENT_AMBULATORY_CARE_PROVIDER_SITE_OTHER): Payer: Medicare Other | Admitting: *Deleted

## 2021-09-29 DIAGNOSIS — Z0000 Encounter for general adult medical examination without abnormal findings: Secondary | ICD-10-CM | POA: Diagnosis not present

## 2021-09-29 NOTE — Progress Notes (Signed)
Subjective:   Savannah Mcclure is a 81 y.o. female who presents for Medicare Annual (Subsequent) preventive examination.  I connected with  [place] on 09/29/21 by a telephone enabled telemedicine application and verified that I am speaking with the correct person using two identifiers.   I discussed the limitations of evaluation and management by telemedicine. The patient expressed understanding and agreed to proceed.  Patient location: home  Provider location: Tele-Health not in office    Review of Systems     Cardiac Risk Factors include: advanced age (>47men, >60 women);smoking/ tobacco exposure;hypertension;sedentary lifestyle     Objective:    Today's Vitals   09/29/21 0903  PainSc: 4    There is no height or weight on file to calculate BMI.  Advanced Directives 09/29/2021 07/10/2021 12/23/2020 04/28/2020 02/03/2020 08/14/2019 05/01/2019  Does Patient Have a Medical Advance Directive? Yes No Yes Yes Yes No No  Type of [employer] [place];Living will Living will [place];Living will - -  Does patient want to make changes to medical advance directive? - - - - - - -  Copy of [place] in Chart? No - copy requested - No - copy requested - No - copy requested - -  Would patient like information on creating a medical advance directive? - - - - - No - Patient declined No - Patient declined    Current Medications (verified) Outpatient Encounter Medications as of 09/29/2021  Medication Sig   acetaminophen (TYLENOL) 500 MG tablet Take 500 mg by mouth every 6 (six) hours as needed.   albuterol (VENTOLIN HFA) 108 (90 Base) MCG/ACT inhaler Inhale 2 puffs into the lungs every 6 (six) hours as needed for wheezing or shortness of breath.   atenolol (TENORMIN) 25 MG tablet Take 1 tablet (25 mg total) by mouth daily.   B-COMPLEX-C PO Take 1 tablet by mouth daily.    benazepril (LOTENSIN) 10 MG tablet Take 1 tablet (10 mg total) by mouth daily.   cilostazol (PLETAL) 100 MG tablet Take 1 tablet (100 mg total) by mouth 2 (two) times daily.   cyclobenzaprine (FLEXERIL) 5 MG tablet Take 5 mg by mouth 3 (three) times daily as needed for muscle spasms.    diphenhydrAMINE (BENADRYL) 25 MG tablet Take 25 mg by mouth every 6 (six) hours as needed.    fluticasone (FLONASE) 50 MCG/ACT nasal spray 2 sprays into each nostril in the morning. Up to three sprays.   Fluticasone-Umeclidin-Vilant (TRELEGY ELLIPTA) 100-62.5-25 MCG/INH AEPB Inhale 1 puff into the lungs daily.   gabapentin (NEURONTIN) 300 MG capsule Take 1 capsule (300 mg total) by mouth 2 (two) times daily.   hydrOXYzine (ATARAX/VISTARIL) 25 MG tablet Take 1 tablet (25 mg total) by mouth 3 (three) times daily as needed.   lidocaine (LIDODERM) 5 % Place 1 patch onto the skin daily. Remove & Discard patch within 12 hours or as directed by MD   Misc Natural Products ([place]) Take by mouth daily.   montelukast (SINGULAIR) 10 MG tablet Take 1 tablet (10 mg total) by mouth at bedtime.   Multiple Vitamins-Minerals (MULTIVITAMIN WITH MINERALS) tablet Take 1 tablet by mouth daily.   NIACINAMIDE PO Take 1 capsule by mouth daily.   omeprazole (PRILOSEC) 20 MG capsule Take 1 capsule (20 mg total) by mouth daily.   thyroid (ARMOUR THYROID) 60 MG tablet Take 1 tablet (60 mg total) by mouth every other  day. To be alternated with the 90mg  tab   thyroid (ARMOUR THYROID) 90 MG tablet Take 1 tablet (90 mg total) by mouth every other day. To be alternated with the 60mg  tab   Facility-Administered Encounter Medications as of 09/29/2021  Medication   albuterol (PROVENTIL) (2.5 MG/3ML) 0.083% nebulizer solution 2.5 mg    Allergies (verified) Other, Oxycodone, Amlodipine, Augmentin [amoxicillin-pot clavulanate], Azithromycin, Contrast media [iodinated contrast media], Doxycycline, Erythromycin, Hydrocodone, Lactose  intolerance (gi), Minocycline, Nsaids, Relafen [nabumetone], Sulfa antibiotics, Synthroid [levothyroxine], Tetracyclines & related, Tilactase, and Tramadol   History: Past Medical History:  Diagnosis Date   Abdominal distension 03/18/2012   Abnormal weight loss 10/30/2017   Acute kidney injury ([place]) 03/22/2012   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    COPD (chronic obstructive pulmonary disease) ([place])    Delirium 03/18/2012   Dyspnea    GERD (gastroesophageal reflux disease)    Heart murmur    Hypertension    Leaky heart valve    Oxygen deficit    uses O2 at night 2 liters   Peripheral vascular disease ([place])    Scoliosis    Sepsis ([place])    Shock ([place]) 03/23/2012   SIRS (systemic inflammatory response syndrome) ([place]) 03/23/2012   Spinal stenosis    Stroke [facility])    Eye   Tachycardia    Past Surgical History:  Procedure Laterality Date   BACK SURGERY     BREAST LUMPECTOMY Left    CATARACT EXTRACTION W/PHACO Left 08/14/2019   Procedure: CATARACT EXTRACTION PHACO AND INTRAOCULAR LENS PLACEMENT (IOC) LEFT 10.22 00:59.7 42.4%;  Surgeon: Leandrew Koyanagi, MD;  Location: [place];  Service: Ophthalmology;  Laterality: Left;  leave it last patient per [place] Right 06/20/2018   Procedure: LOWER EXTREMITY ANGIOGRAPHY;  Surgeon: Algernon Huxley, MD;  Location: [place] [facility];  Service: Cardiovascular;  Laterality: Right;   LOWER EXTREMITY ANGIOGRAPHY Left 02/03/2020   Procedure: LOWER EXTREMITY ANGIOGRAPHY;  Surgeon: Algernon Huxley, MD;  Location: [place] [facility];  Service: Cardiovascular;  Laterality: Left;   right eye surgery     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   TOTAL ABDOMINAL HYSTERECTOMY     total   WISDOM TOOTH EXTRACTION     Family History   Problem Relation Age of Onset   Intracerebral hemorrhage Mother    Hypertension Mother    Heart failure Father    Schizophrenia Daughter    Rheum arthritis Maternal Grandmother    Kidney disease Maternal Grandmother    Heart disease Paternal Grandmother    Stroke Paternal Grandmother    Stroke Paternal Grandfather    Heart disease Paternal Grandfather    Social History   Socioeconomic History   Marital status: Widowed    Spouse name: Not on file   Number of children: 2   Years of education: college    Highest education level: Not on file  Occupational History   Occupation: retired  Tobacco Use   Smoking status: Every Day    Packs/day: 1.00    Years: 40.00    Pack years: 40.00    Types: Cigarettes   Smokeless tobacco: Never   Tobacco comments:    0.5PPD 08/31/2021  Vaping Use   Vaping Use: Former  Substance and Sexual Activity   Alcohol use: Yes    Comment: wine on occasion (1x/month)   Drug use: No   Sexual activity: Not Currently  Other Topics Concern   Not on file  Social History Narrative   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 And grandkids (44 & 16 y.o.)   Social Determinants of Health   Financial Resource Strain: Medium Risk   Difficulty of Paying Living Expenses: Somewhat hard  Food Insecurity: Food Insecurity Present   Worried About Charity fundraiser in the Last Year: Sometimes true   Ran Out of Food in the Last Year: Sometimes true  Transportation Needs: No Transportation Needs   Lack of Transportation (Medical): No   Lack of Transportation (Non-Medical): No  Physical Activity: Inactive   Days of Exercise per Week: 0 days   Minutes of Exercise per Session: 0 min  Stress: Stress Concern Present   Feeling of Stress : To some extent  Social Connections: Socially Isolated   Frequency of Communication with Friends and Family: More than three times a week   Frequency of Social Gatherings with Friends and Family: Three times a week   Attends  Religious Services: Never   Active Member of Clubs or Organizations: No   Attends Archivist Meetings: Never   Marital Status: Widowed    Tobacco Counseling Ready to quit: Not Answered Counseling given: Not Answered Tobacco comments: 0.5PPD 08/31/2021   Clinical Intake:  Pre-visit preparation completed: Yes  Pain : 0-10 Pain Score: 4  Pain Type: Chronic pain Pain Location: Back Pain Descriptors / Indicators: Burning, Constant, Aching Pain Onset: More than a month ago Pain Frequency: Intermittent     Nutritional Risks: None Diabetes: No  How often do you need to have someone help you when you read instructions, pamphlets, or other written materials from your doctor or pharmacy?: 1 - Never  Diabetic  no  Interpreter Needed?: No  Information entered by :: Leroy Kennedy LPN   Activities of Daily Living In your present state of health, do you have any difficulty performing the following activities: 09/29/2021 07/11/2021  Hearing? N N  Vision? N Y  Comment - no vision in right eye  Difficulty concentrating or making decisions? N N  Walking or climbing stairs? N Y  Dressing or bathing? N N  Doing errands, shopping? N N  Preparing Food and eating ? N -  Using the Toilet? N -  In the past six months, have you accidently leaked urine? N -  Do you have problems with loss of bowel control? N -  Managing your Medications? N -  Managing your Finances? N -  Housekeeping or managing your Housekeeping? N -  Some recent data might be hidden    Patient Care Team: [name], DO as PCP - General (Family Medicine) Clyde Canterbury, MD as Referring Physician (Otolaryngology)  Indicate any recent Medical Services you may have received from other than [facility] providers in the past year (date may be approximate).     Assessment:   This is a routine wellness examination for Savannah.  Hearing/Vision screen Hearing Screening - Comments:: No trouble hearing Vision  Screening - Comments:: [facility]  Up to date  Dietary issues and exercise activities discussed: Current Exercise Habits: The patient does not participate in regular exercise at present   Goals Addressed             This Visit's Progress  Patient Stated       Continue current lifestyle     Quit Smoking   Not on track    Smoking cessation discussed       Depression Screen PHQ 2/9 Scores 09/29/2021 08/05/2021 05/27/2021 04/20/2021 12/23/2020 09/10/2020 03/09/2020  PHQ - 2 Score 0 0 1 0 0 0 4  PHQ- 9 Score 2 1 2  - - 0 6    Fall Risk Fall Risk  09/29/2021 04/20/2021 12/23/2020 09/10/2020 04/28/2020  Falls in the past year? 0 0 0 0 0  Number falls in past yr: 0 0 0 - -  Injury with Fall? 0 0 0 - -  Risk for fall due to : - No Fall Risks - - -  Follow up Falls evaluation completed;Falls prevention discussed Falls evaluation completed - - -    FALL RISK PREVENTION PERTAINING TO THE HOME:  Any stairs in or around the home? No  If so, are there any without handrails? No  Home free of loose throw rugs in walkways, pet beds, electrical cords, etc? Yes  Adequate lighting in your home to reduce risk of falls? Yes   ASSISTIVE DEVICES UTILIZED TO PREVENT FALLS:  Life alert? No  Use of a cane, walker or w/c? Yes  Grab bars in the bathroom? Yes  Shower chair or bench in shower? Yes  Elevated toilet seat or a handicapped toilet? No   TIMED UP AND GO:  Was the test performed? No .    Cognitive Function:  Normal cognitive status assessed by direct observation by this Nurse Health Advisor. No abnormalities found.       6CIT Screen 03/09/2020 11/20/2017  What Year? 0 points 0 points  What month? 0 points 0 points  What time? 0 points 0 points  Count back from 20 0 points 0 points  Months in reverse 0 points 0 points  Repeat phrase 0 points 0 points  Total Score 0 0    Immunizations Immunization History  Administered Date(s) Administered   Fluad Quad(high Dose 65+) 04/25/2019,  06/01/2020, 05/27/2021   Influenza, High Dose Seasonal PF 04/23/2018   Influenza-Unspecified 06/15/2017, 04/23/2018   PFIZER Comirnaty(Gray Top)Covid-19 Tri-Sucrose Vaccine 12/08/2020   PFIZER(Purple Top)SARS-COV-2 Vaccination 08/22/2019, 09/14/2019, 05/04/2020   Pfizer Covid-19 Vaccine Bivalent Booster 42yrs & up 06/08/2021   Pneumococcal Conjugate-13 09/06/2018   Pneumococcal Polysaccharide-23 03/09/2020   Td 12/12/2017    TDAP status: Up to date  Flu Vaccine status: Up to date  Pneumococcal vaccine status: Up to date  Covid-19 vaccine status: Information provided on how to obtain vaccines.   Qualifies for Shingles Vaccine? Yes   Zostavax completed Yes   Shingrix Completed?: No.    Education has been provided regarding the importance of this vaccine. Patient has been advised to call insurance company to determine out of pocket expense if they have not yet received this vaccine. Advised may also receive vaccine at local pharmacy or Health Dept. Verbalized acceptance and understanding.  Screening Tests Health Maintenance  Topic Date Due   Zoster Vaccines- Shingrix (1 of 2) 12/27/2021 (Originally 06/18/1990)   TETANUS/TDAP  12/13/2027   Pneumonia Vaccine 52+ Years old  Completed   INFLUENZA VACCINE  Completed   DEXA SCAN  Completed   COVID-19 Vaccine  Completed   HPV VACCINES  Aged Out    Health Maintenance  There are no preventive care reminders to display for this patient.   Colorectal cancer screening: No longer required.   Mammogram status: No longer required  due to age.  Bone Density declined  Lung Cancer Screening: (Low Dose CT Chest recommended if Age 17-80 years, 30 pack-year currently smoking OR have quit w/in 15years.  declined  Lung Cancer Screening Referral:   Additional Screening:  Hepatitis C Screening: does not qualify; Completed 2022  Vision Screening: Recommended annual ophthalmology exams for early detection of glaucoma and other disorders of the  eye. Is the patient up to date with their annual eye exam?  Yes  Who is the provider or what is the name of the office in which the patient attends annual eye exams? [facility] If pt is not established with a provider, would they like to be referred to a provider to establish care? No .   Dental Screening: Recommended annual dental exams for proper oral hygiene  Community Resource Referral / Chronic Care Management: CRR required this visit?  No   CCM required this visit?  No      Plan:     I have personally reviewed and noted the following in the patients chart:   Medical and social history Use of alcohol, tobacco or illicit drugs  Current medications and supplements including opioid prescriptions.  Functional ability and status Nutritional status Physical activity Advanced directives List of other physicians Hospitalizations, surgeries, and ER visits in previous 12 months Vitals Screenings to include cognitive, depression, and falls Referrals and appointments  In addition, I have reviewed and discussed with patient certain preventive protocols, quality metrics, and best practice recommendations. A written personalized care plan for preventive services as well as general preventive health recommendations were provided to patient.     Leroy Kennedy, LPN   [id number]/[id number]   Nurse Notes:

## 2021-09-29 NOTE — Patient Instructions (Signed)
Savannah Mcclure , Thank you for taking time to come for your Medicare Wellness Visit. I appreciate your ongoing commitment to your health goals. Please review the following plan we discussed and let me know if I can assist you in the future.   Screening recommendations/referrals: Colonoscopy: no longer required Mammogram: no longer required Bone Density: Education provided Recommended yearly ophthalmology/optometry visit for glaucoma screening and checkup Recommended yearly dental visit for hygiene and checkup  Vaccinations: Influenza vaccine: up to date Pneumococcal vaccine: up to date Tdap vaccine: up to date Shingles vaccine: Education provided     Advanced directives: not on file  Conditions/risks identified:   Next appointment: 10-19-2021 @ 1:00  [facility] 82 Years and Older, Female Preventive care refers to lifestyle choices and visits with your health care provider that can promote health and wellness. What does preventive care include? A yearly physical exam. This is also called an annual well check. Dental exams once or twice a year. Routine eye exams. Ask your health care provider how often you should have your eyes checked. Personal lifestyle choices, including: Daily care of your teeth and gums. Regular physical activity. Eating a healthy diet. Avoiding tobacco and drug use. Limiting alcohol use. Practicing safe sex. Taking low-dose aspirin every day. Taking vitamin and mineral supplements as recommended by your health care provider. What happens during an annual well check? The services and screenings done by your health care provider during your annual well check will depend on your age, overall health, lifestyle risk factors, and family history of disease. Counseling  Your health care provider may ask you questions about your: Alcohol use. Tobacco use. Drug use. Emotional well-being. Home and relationship well-being. Sexual  activity. Eating habits. History of falls. Memory and ability to understand (cognition). Work and work Statistician. Reproductive health. Screening  You may have the following tests or measurements: Height, weight, and BMI. Blood pressure. Lipid and cholesterol levels. These may be checked every 5 years, or more frequently if you are over 82 years old. Skin check. Lung cancer screening. You may have this screening every year starting at age 26 if you have a 30-pack-year history of smoking and currently smoke or have quit within the past 15 years. Fecal occult blood test (FOBT) of the stool. You may have this test every year starting at age 43. Flexible sigmoidoscopy or colonoscopy. You may have a sigmoidoscopy every 5 years or a colonoscopy every 10 years starting at age 37. Hepatitis C blood test. Hepatitis B blood test. Sexually transmitted disease (STD) testing. Diabetes screening. This is done by checking your blood sugar (glucose) after you have not eaten for a while (fasting). You may have this done every 1-3 years. Bone density scan. This is done to screen for osteoporosis. You may have this done starting at age 49. Mammogram. This may be done every 1-2 years. Talk to your health care provider about how often you should have regular mammograms. Talk with your health care provider about your test results, treatment options, and if necessary, the need for more tests. Vaccines  Your health care provider may recommend certain vaccines, such as: Influenza vaccine. This is recommended every year. Tetanus, diphtheria, and acellular pertussis (Tdap, Td) vaccine. You may need a Td booster every 10 years. Zoster vaccine. You may need this after age 23. Pneumococcal 13-valent conjugate (PCV13) vaccine. One dose is recommended after age 25. Pneumococcal polysaccharide (PPSV23) vaccine. One dose is recommended after age 36. Talk to your health  care provider about which screenings and vaccines  you need and how often you need them. This information is not intended to replace advice given to you by your health care provider. Make sure you discuss any questions you have with your health care provider. Document Released: 08/28/2015 Document Revised: 04/20/2016 Document Reviewed: 06/02/2015 Elsevier Interactive Patient Education  2017 [place] Prevention in the Home Falls can cause injuries. They can happen to people of all ages. There are many things you can do to make your home safe and to help prevent falls. What can I do on the outside of my home? Regularly fix the edges of walkways and driveways and fix any cracks. Remove anything that might make you trip as you walk through a door, such as a raised step or threshold. Trim any bushes or trees on the path to your home. Use bright outdoor lighting. Clear any walking paths of anything that might make someone trip, such as rocks or tools. Regularly check to see if handrails are loose or broken. Make sure that both sides of any steps have handrails. Any raised decks and porches should have guardrails on the edges. Have any leaves, snow, or ice cleared regularly. Use sand or salt on walking paths during winter. Clean up any spills in your garage right away. This includes oil or grease spills. What can I do in the bathroom? Use night lights. Install grab bars by the toilet and in the tub and shower. Do not use towel bars as grab bars. Use non-skid mats or decals in the tub or shower. If you need to sit down in the shower, use a plastic, non-slip stool. Keep the floor dry. Clean up any water that spills on the floor as soon as it happens. Remove soap buildup in the tub or shower regularly. Attach bath mats securely with double-sided non-slip rug tape. Do not have throw rugs and other things on the floor that can make you trip. What can I do in the bedroom? Use night lights. Make sure that you have a light by your bed that  is easy to reach. Do not use any sheets or blankets that are too big for your bed. They should not hang down onto the floor. Have a firm chair that has side arms. You can use this for support while you get dressed. Do not have throw rugs and other things on the floor that can make you trip. What can I do in the kitchen? Clean up any spills right away. Avoid walking on wet floors. Keep items that you use a lot in easy-to-reach places. If you need to reach something above you, use a strong step stool that has a grab bar. Keep electrical cords out of the way. Do not use floor polish or wax that makes floors slippery. If you must use wax, use non-skid floor wax. Do not have throw rugs and other things on the floor that can make you trip. What can I do with my stairs? Do not leave any items on the stairs. Make sure that there are handrails on both sides of the stairs and use them. Fix handrails that are broken or loose. Make sure that handrails are as long as the stairways. Check any carpeting to make sure that it is firmly attached to the stairs. Fix any carpet that is loose or worn. Avoid having throw rugs at the top or bottom of the stairs. If you do have throw rugs, attach them to  the floor with carpet tape. Make sure that you have a light switch at the top of the stairs and the bottom of the stairs. If you do not have them, ask someone to add them for you. What else can I do to help prevent falls? Wear shoes that: Do not have high heels. Have rubber bottoms. Are comfortable and fit you well. Are closed at the toe. Do not wear sandals. If you use a stepladder: Make sure that it is fully opened. Do not climb a closed stepladder. Make sure that both sides of the stepladder are locked into place. Ask someone to hold it for you, if possible. Clearly mark and make sure that you can see: Any grab bars or handrails. First and last steps. Where the edge of each step is. Use tools that help you  move around (mobility aids) if they are needed. These include: Canes. Walkers. Scooters. Crutches. Turn on the lights when you go into a dark area. Replace any light bulbs as soon as they burn out. Set up your furniture so you have a clear path. Avoid moving your furniture around. If any of your floors are uneven, fix them. If there are any pets around you, be aware of where they are. Review your medicines with your doctor. Some medicines can make you feel dizzy. This can increase your chance of falling. Ask your doctor what other things that you can do to help prevent falls. This information is not intended to replace advice given to you by your health care provider. Make sure you discuss any questions you have with your health care provider. Document Released: 05/28/2009 Document Revised: 01/07/2016 Document Reviewed: 09/05/2014 Elsevier Interactive Patient Education  2017 [employer].

## 2021-10-19 ENCOUNTER — Ambulatory Visit (INDEPENDENT_AMBULATORY_CARE_PROVIDER_SITE_OTHER): Payer: Medicare Other | Admitting: Family Medicine

## 2021-10-19 ENCOUNTER — Other Ambulatory Visit: Payer: Self-pay

## 2021-10-19 ENCOUNTER — Encounter: Payer: Self-pay | Admitting: Family Medicine

## 2021-10-19 VITALS — BP 106/64 | HR 101 | Temp 98.3°F | Wt 109.2 lb

## 2021-10-19 DIAGNOSIS — M4120 Other idiopathic scoliosis, site unspecified: Secondary | ICD-10-CM

## 2021-10-19 DIAGNOSIS — I700 Atherosclerosis of aorta: Secondary | ICD-10-CM

## 2021-10-19 DIAGNOSIS — J441 Chronic obstructive pulmonary disease with (acute) exacerbation: Secondary | ICD-10-CM

## 2021-10-19 DIAGNOSIS — E039 Hypothyroidism, unspecified: Secondary | ICD-10-CM | POA: Diagnosis not present

## 2021-10-19 DIAGNOSIS — I10 Essential (primary) hypertension: Secondary | ICD-10-CM

## 2021-10-19 DIAGNOSIS — D692 Other nonthrombocytopenic purpura: Secondary | ICD-10-CM

## 2021-10-19 DIAGNOSIS — I6523 Occlusion and stenosis of bilateral carotid arteries: Secondary | ICD-10-CM

## 2021-10-19 DIAGNOSIS — E785 Hyperlipidemia, unspecified: Secondary | ICD-10-CM

## 2021-10-19 DIAGNOSIS — E559 Vitamin D deficiency, unspecified: Secondary | ICD-10-CM

## 2021-10-19 DIAGNOSIS — I739 Peripheral vascular disease, unspecified: Secondary | ICD-10-CM

## 2021-10-19 DIAGNOSIS — M47814 Spondylosis without myelopathy or radiculopathy, thoracic region: Secondary | ICD-10-CM | POA: Diagnosis not present

## 2021-10-19 DIAGNOSIS — I70213 Atherosclerosis of native arteries of extremities with intermittent claudication, bilateral legs: Secondary | ICD-10-CM

## 2021-10-19 DIAGNOSIS — G894 Chronic pain syndrome: Secondary | ICD-10-CM | POA: Diagnosis not present

## 2021-10-19 DIAGNOSIS — I25118 Atherosclerotic heart disease of native coronary artery with other forms of angina pectoris: Secondary | ICD-10-CM

## 2021-10-19 DIAGNOSIS — F419 Anxiety disorder, unspecified: Secondary | ICD-10-CM | POA: Diagnosis not present

## 2021-10-19 DIAGNOSIS — D649 Anemia, unspecified: Secondary | ICD-10-CM

## 2021-10-19 DIAGNOSIS — J9611 Chronic respiratory failure with hypoxia: Secondary | ICD-10-CM

## 2021-10-19 MED ORDER — CYCLOBENZAPRINE HCL 5 MG PO TABS: 5.0000 mg | ORAL_TABLET | Freq: Three times a day (TID) | ORAL | 6 refills | Status: DC | PRN

## 2021-10-19 MED ORDER — ATENOLOL 25 MG PO TABS: 25.0000 mg | ORAL_TABLET | Freq: Every day | ORAL | 1 refills | Status: DC

## 2021-10-19 MED ORDER — GABAPENTIN 300 MG PO CAPS: 300.0000 mg | ORAL_CAPSULE | Freq: Two times a day (BID) | ORAL | 6 refills | Status: DC

## 2021-10-19 MED ORDER — OMEPRAZOLE 20 MG PO CPDR: 20.0000 mg | DELAYED_RELEASE_CAPSULE | Freq: Every day | ORAL | 3 refills | Status: DC

## 2021-10-19 MED ORDER — HYDROXYZINE HCL 25 MG PO TABS: 25.0000 mg | ORAL_TABLET | Freq: Three times a day (TID) | ORAL | 3 refills | Status: DC | PRN

## 2021-10-19 MED ORDER — MONTELUKAST SODIUM 10 MG PO TABS
10.0000 mg | ORAL_TABLET | Freq: Every day | ORAL | 3 refills | Status: DC
Start: 2021-10-19 — End: 2023-06-08

## 2021-10-19 MED ORDER — ALBUTEROL SULFATE HFA 108 (90 BASE) MCG/ACT IN AERS: 2.0000 | INHALATION_SPRAY | Freq: Four times a day (QID) | RESPIRATORY_TRACT | 6 refills | Status: DC | PRN

## 2021-10-19 MED ORDER — BENAZEPRIL HCL 10 MG PO TABS: 10.0000 mg | ORAL_TABLET | Freq: Every day | ORAL | 1 refills | Status: DC

## 2021-10-19 MED ORDER — CILOSTAZOL 100 MG PO TABS: 100.0000 mg | ORAL_TABLET | Freq: Two times a day (BID) | ORAL | 1 refills | Status: DC

## 2021-10-19 NOTE — Progress Notes (Signed)
BP 106/64    Pulse (!) 101    Temp 98.3 F (36.8 C)    Wt 109 lb 3.2 oz (49.5 kg)    BMI 20.63 kg/m    Subjective:    Patient ID: Savannah Mcclure, female    DOB: 1941/05/25, 81 y.o.   MRN: [id number]  HPI: Savannah Mcclure is a 81 y.o. female  Chief Complaint  Patient presents with   Hypertension   COPD   Anxiety   HYPERTENSION / HYPERLIPIDEMIA Satisfied with current treatment? yes Duration of hypertension: chronic BP monitoring frequency: not checking BP medication side effects: no Past BP meds: benazepril, atenolol Duration of hyperlipidemia: chronic Cholesterol medication side effects: no Cholesterol supplements: none Past cholesterol medications: none Medication compliance: good compliance Aspirin: no Recent stressors: no Recurrent headaches: no Visual changes: no Palpitations: no Dyspnea: no Chest pain: no Lower extremity edema: no Dizzy/lightheaded: no  HYPOTHYROIDISM Thyroid control status:controlled Satisfied with current treatment? yes Medication side effects: no Medication compliance: excellent compliance Etiology of hypothyroidism:  Recent dose adjustment:no Fatigue: no Cold intolerance: no Heat intolerance: no Weight gain: no Weight loss: no Constipation: no Diarrhea/loose stools: no Palpitations: no Lower extremity edema: no Anxiety/depressed mood: no  COPD COPD status: stable Satisfied with current treatment?: yes Oxygen use: no Dyspnea frequency: occasionally Cough frequency:  occasionally Rescue inhaler frequency:  rarely Limitation of activity: no Productive cough: no Pneumovax: Up to Date Influenza: Up to Date   Relevant past medical, surgical, family and social history reviewed and updated as indicated. Interim medical history since our last visit reviewed. Allergies and medications reviewed and updated.  Review of Systems  Constitutional: Negative.   Respiratory: Negative.    Cardiovascular: Negative.    Gastrointestinal: Negative.   Musculoskeletal: Negative.   Neurological: Negative.   Psychiatric/Behavioral: Negative.     Per HPI unless specifically indicated above     Objective:    BP 106/64    Pulse (!) 101    Temp 98.3 F (36.8 C)    Wt 109 lb 3.2 oz (49.5 kg)    BMI 20.63 kg/m   Wt Readings from Last 3 Encounters:  10/19/21 109 lb 3.2 oz (49.5 kg)  08/31/21 105 lb 12.8 oz (48 kg)  08/05/21 105 lb (47.6 kg)    Physical Exam Vitals and nursing note reviewed.  Constitutional:      General: She is not in acute distress.    Appearance: Normal appearance. She is not ill-appearing, toxic-appearing or diaphoretic.  HENT:     Head: Normocephalic and atraumatic.     Right Ear: External ear normal.     Left Ear: External ear normal.     Nose: Nose normal.     Mouth/Throat:     Mouth: Mucous membranes are moist.     Pharynx: Oropharynx is clear.  Eyes:     General: No scleral icterus.       Right eye: No discharge.        Left eye: No discharge.     Extraocular Movements: Extraocular movements intact.     Conjunctiva/sclera: Conjunctivae normal.     Pupils: Pupils are equal, round, and reactive to light.  Cardiovascular:     Rate and Rhythm: Normal rate and regular rhythm.     Pulses: Normal pulses.     Heart sounds: Normal heart sounds. No murmur heard.   No friction rub. No gallop.  Pulmonary:     Effort: Pulmonary effort is normal. No respiratory distress.  Breath sounds: Normal breath sounds. No stridor. No wheezing, rhonchi or rales.  Chest:     Chest wall: No tenderness.  Musculoskeletal:        General: Normal range of motion.     Cervical back: Normal range of motion and neck supple.  Skin:    General: Skin is warm and dry.     Capillary Refill: Capillary refill takes less than 2 seconds.     Coloration: Skin is not jaundiced or pale.     Findings: No bruising, erythema, lesion or rash.  Neurological:     General: No focal deficit present.     Mental  Status: She is alert and oriented to person, place, and time. Mental status is at baseline.  Psychiatric:        Mood and Affect: Mood normal.        Behavior: Behavior normal.        Thought Content: Thought content normal.        Judgment: Judgment normal.    Results for orders placed or performed in visit on 10/19/21  CBC with Differential/Platelet  Result Value Ref Range   WBC 10.7 3.4 - 10.8 x10E3/uL   RBC 4.21 3.77 - 5.28 x10E6/uL   Hemoglobin 13.2 11.1 - 15.9 g/dL   Hematocrit 39.6 34.0 - 46.6 %   MCV 94 79 - 97 fL   MCH 31.4 26.6 - 33.0 pg   MCHC 33.3 31.5 - 35.7 g/dL   RDW 12.1 11.7 - 15.4 %   Platelets 444 150 - 450 x10E3/uL   Neutrophils 66 Not Estab. %   Lymphs 25 Not Estab. %   Monocytes 6 Not Estab. %   Eos 2 Not Estab. %   Basos 1 Not Estab. %   Neutrophils Absolute 7.1 (H) 1.4 - 7.0 x10E3/uL   Lymphocytes Absolute 2.7 0.7 - 3.1 x10E3/uL   Monocytes Absolute 0.6 0.1 - 0.9 x10E3/uL   EOS (ABSOLUTE) 0.2 0.0 - 0.4 x10E3/uL   Basophils Absolute 0.1 0.0 - 0.2 x10E3/uL   Immature Granulocytes 0 Not Estab. %   Immature Grans (Abs) 0.0 0.0 - 0.1 x10E3/uL  Comprehensive metabolic panel  Result Value Ref Range   Glucose 171 (H) 70 - 99 mg/dL   BUN 14 8 - 27 mg/dL   Creatinine, Ser 0.72 0.57 - 1.00 mg/dL   eGFR 84 >59 mL/min/1.73   BUN/Creatinine Ratio 19 12 - 28   Sodium 139 134 - 144 mmol/L   Potassium 4.0 3.5 - 5.2 mmol/L   Chloride 100 96 - 106 mmol/L   CO2 24 20 - 29 mmol/L   Calcium 9.0 8.7 - 10.3 mg/dL   Total Protein 6.3 6.0 - 8.5 g/dL   Albumin 4.3 3.6 - 4.6 g/dL   Globulin, Total 2.0 1.5 - 4.5 g/dL   Albumin/Globulin Ratio 2.2 1.2 - 2.2   Bilirubin Total <0.2 0.0 - 1.2 mg/dL   Alkaline Phosphatase 73 44 - 121 IU/L   AST 13 0 - 40 IU/L   ALT 11 0 - 32 IU/L  Lipid Panel w/o Chol/HDL Ratio  Result Value Ref Range   Cholesterol, Total 161 100 - 199 mg/dL   Triglycerides 92 0 - 149 mg/dL   HDL 60 >39 mg/dL   VLDL Cholesterol Cal 17 5 - 40 mg/dL   LDL  Chol Calc (NIH) 84 0 - 99 mg/dL  TSH  Result Value Ref Range   TSH 5.320 (H) 0.450 - 4.500 uIU/mL  VITAMIN D 25  Hydroxy (Vit-D Deficiency, Fractures)  Result Value Ref Range   Vit D, 25-Hydroxy 12.5 (L) 30.0 - 100.0 ng/mL      Assessment & Plan:   Problem List Items Addressed This Visit       Cardiovascular and Mediastinum   Essential hypertension - Primary    Under good control on current regimen. Continue current regimen. Continue to monitor. Call with any concerns. Refills given. Labs drawn today.       Relevant Medications   benazepril (LOTENSIN) 10 MG tablet   atenolol (TENORMIN) 25 MG tablet   Other Relevant Orders   CBC with Differential/Platelet (Completed)   Comprehensive metabolic panel (Completed)   Microalbumin, Urine Waived   Urinalysis, Routine w reflex microscopic   PAD (peripheral artery disease) (HCC)    Will keep BP under good control. Continue to monitor. Call with any concerns.       Relevant Medications   benazepril (LOTENSIN) 10 MG tablet   atenolol (TENORMIN) 25 MG tablet   Carotid stenosis    Will keep BP under good control. Continue to monitor. Call with any concerns.       Relevant Medications   benazepril (LOTENSIN) 10 MG tablet   atenolol (TENORMIN) 25 MG tablet   Atherosclerosis of native artery of both lower extremities with intermittent claudication (HCC)    Will keep BP under good control. Continue to monitor. Call with any concerns.       Relevant Medications   traMADol (ULTRAM) 50 MG tablet   gabapentin (NEURONTIN) 300 MG capsule   cyclobenzaprine (FLEXERIL) 5 MG tablet   benazepril (LOTENSIN) 10 MG tablet   atenolol (TENORMIN) 25 MG tablet   Aortic atherosclerosis (HCC)    Will keep BP under good control. Continue to monitor. Call with any concerns.       Relevant Medications   benazepril (LOTENSIN) 10 MG tablet   atenolol (TENORMIN) 25 MG tablet   Other Relevant Orders   CBC with Differential/Platelet (Completed)    Comprehensive metabolic panel (Completed)   Senile purpura (HCC)    Stable. Continue to monitor.       Relevant Medications   benazepril (LOTENSIN) 10 MG tablet   atenolol (TENORMIN) 25 MG tablet   Coronary artery disease of native artery of native heart with stable angina pectoris ([place])    Will keep BP under good control. Continue to monitor. Call with any concerns.       Relevant Medications   traMADol (ULTRAM) 50 MG tablet   gabapentin (NEURONTIN) 300 MG capsule   cyclobenzaprine (FLEXERIL) 5 MG tablet   benazepril (LOTENSIN) 10 MG tablet   atenolol (TENORMIN) 25 MG tablet     Respiratory   COPD exacerbation (HCC)    Resolved. Continue to monitor.       Relevant Medications   montelukast (SINGULAIR) 10 MG tablet   albuterol (VENTOLIN HFA) 108 (90 Base) MCG/ACT inhaler   Other Relevant Orders   CBC with Differential/Platelet (Completed)   Comprehensive metabolic panel (Completed)   Chronic respiratory failure with hypoxia (HCC)    Stable. Continue to monitor.         Endocrine   Hypothyroidism    Rechecking labs today. Await results. Treat as needed.       Relevant Medications   atenolol (TENORMIN) 25 MG tablet   Other Relevant Orders   CBC with Differential/Platelet (Completed)   Comprehensive metabolic panel (Completed)   TSH (Completed)     Musculoskeletal and Integument   Idiopathic scoliosis  and kyphoscoliosis    Stable. Continue to follow with pain management. Call with any concerns.       Relevant Medications   gabapentin (NEURONTIN) 300 MG capsule   Thoracic spondylosis without myelopathy    Stable. Continue to follow with pain management. Call with any concerns.       Relevant Medications   traMADol (ULTRAM) 50 MG tablet   gabapentin (NEURONTIN) 300 MG capsule   cyclobenzaprine (FLEXERIL) 5 MG tablet     Other   Anxiety    Under good control on current regimen. Continue current regimen. Continue to monitor. Call with any concerns. Refills  given.        Relevant Medications   hydrOXYzine (ATARAX) 25 MG tablet   Other Relevant Orders   CBC with Differential/Platelet (Completed)   Comprehensive metabolic panel (Completed)   Anemia    Rechecking labs today. Await results. Treat as needed.       Relevant Orders   CBC with Differential/Platelet (Completed)   Comprehensive metabolic panel (Completed)   Chronic pain syndrome    Stable. Continue to follow with pain management. Call with any concerns.       Relevant Medications   traMADol (ULTRAM) 50 MG tablet   gabapentin (NEURONTIN) 300 MG capsule   cyclobenzaprine (FLEXERIL) 5 MG tablet   Dyslipidemia    Under good control on current regimen. Continue current regimen. Continue to monitor. Call with any concerns. Refills given. Labs drawn today.       Relevant Orders   CBC with Differential/Platelet (Completed)   Comprehensive metabolic panel (Completed)   Lipid Panel w/o Chol/HDL Ratio (Completed)   Other Visit Diagnoses     Vitamin D deficiency       Relevant Orders   VITAMIN D 25 Hydroxy (Vit-D Deficiency, Fractures) (Completed)        Follow up plan: Return in about 6 months (around 04/21/2022).

## 2021-10-20 LAB — COMPREHENSIVE METABOLIC PANEL
ALT: 11 IU/L (ref 0–32)
AST: 13 IU/L (ref 0–40)
Albumin/Globulin Ratio: 2.2 (ref 1.2–2.2)
Albumin: 4.3 g/dL (ref 3.6–4.6)
Alkaline Phosphatase: 73 IU/L (ref 44–121)
BUN/Creatinine Ratio: 19 (ref 12–28)
BUN: 14 mg/dL (ref 8–27)
Bilirubin Total: <0.2 mg/dL (ref 0.0–1.2)
CO2: 24 mmol/L (ref 20–29)
Calcium: 9 mg/dL (ref 8.7–10.3)
Chloride: 100 mmol/L (ref 96–106)
Creatinine, Ser: 0.72 mg/dL (ref 0.57–1.00)
Globulin, Total: 2 g/dL (ref 1.5–4.5)
Glucose: 171 mg/dL — ABNORMAL HIGH (ref 70–99)
Potassium: 4 mmol/L (ref 3.5–5.2)
Sodium: 139 mmol/L (ref 134–144)
Total Protein: 6.3 g/dL (ref 6.0–8.5)
eGFR: 84 mL/min/{1.73_m2} (ref >59)

## 2021-10-20 LAB — LIPID PANEL W/O CHOL/HDL RATIO
Cholesterol, Total: 161 mg/dL (ref 100–199)
HDL: 60 mg/dL (ref >39)
LDL Chol Calc (NIH): 84 mg/dL (ref 0–99)
Triglycerides: 92 mg/dL (ref 0–149)
VLDL Cholesterol Cal: 17 mg/dL (ref 5–40)

## 2021-10-20 LAB — VITAMIN D 25 HYDROXY (VIT D DEFICIENCY, FRACTURES): Vit D, 25-Hydroxy: 12.5 ng/mL — ABNORMAL LOW (ref 30.0–100.0)

## 2021-10-20 LAB — CBC WITH DIFFERENTIAL/PLATELET
Basophils Absolute: 0.1 10*3/uL (ref 0.0–0.2)
Basos: 1 %
EOS (ABSOLUTE): 0.2 10*3/uL (ref 0.0–0.4)
Eos: 2 %
Hematocrit: 39.6 % (ref 34.0–46.6)
Hemoglobin: 13.2 g/dL (ref 11.1–15.9)
Immature Grans (Abs): 0 10*3/uL (ref 0.0–0.1)
Immature Granulocytes: 0 %
Lymphocytes Absolute: 2.7 10*3/uL (ref 0.7–3.1)
Lymphs: 25 %
MCH: 31.4 pg (ref 26.6–33.0)
MCHC: 33.3 g/dL (ref 31.5–35.7)
MCV: 94 fL (ref 79–97)
Monocytes Absolute: 0.6 10*3/uL (ref 0.1–0.9)
Monocytes: 6 %
Neutrophils Absolute: 7.1 10*3/uL — ABNORMAL HIGH (ref 1.4–7.0)
Neutrophils: 66 %
Platelets: 444 10*3/uL (ref 150–450)
RBC: 4.21 x10E6/uL (ref 3.77–5.28)
RDW: 12.1 % (ref 11.7–15.4)
WBC: 10.7 10*3/uL (ref 3.4–10.8)

## 2021-10-20 LAB — TSH: TSH: 5.32 u[IU]/mL — ABNORMAL HIGH (ref 0.450–4.500)

## 2021-10-20 NOTE — Assessment & Plan Note (Signed)
Under good control on current regimen. Continue current regimen. Continue to monitor. Call with any concerns. Refills given.   

## 2021-10-20 NOTE — Assessment & Plan Note (Signed)
Under good control on current regimen. Continue current regimen. Continue to monitor. Call with any concerns. Refills given. Labs drawn today.   

## 2021-10-20 NOTE — Assessment & Plan Note (Signed)
Stable. Continue to follow with pain management. Call with any concerns.  ?

## 2021-10-20 NOTE — Assessment & Plan Note (Signed)
Will keep BP under good control. Continue to monitor. Call with any concerns.  ?

## 2021-10-20 NOTE — Assessment & Plan Note (Signed)
Stable.       - Continue to monitor

## 2021-10-20 NOTE — Assessment & Plan Note (Signed)
Rechecking labs today. Await results. Treat as needed.  °

## 2021-10-20 NOTE — Assessment & Plan Note (Signed)
Resolved.  Continue to monitor.

## 2021-10-21 LAB — URINALYSIS, ROUTINE W REFLEX MICROSCOPIC
Bilirubin, UA: NEGATIVE
Glucose, UA: NEGATIVE
Ketones, UA: NEGATIVE
Leukocytes,UA: NEGATIVE
Nitrite, UA: NEGATIVE
Protein,UA: NEGATIVE
RBC, UA: NEGATIVE
Specific Gravity, UA: 1.02 (ref 1.005–1.030)
Urobilinogen, Ur: 0.2 mg/dL (ref 0.2–1.0)
pH, UA: 6 (ref 5.0–7.5)

## 2021-10-21 LAB — MICROALBUMIN, URINE WAIVED
Creatinine, Urine Waived: 50 mg/dL (ref 10–300)
Microalb, Ur Waived: 10 mg/L (ref 0–19)
Microalb/Creat Ratio: <30 mg/g (ref <30)

## 2021-10-25 ENCOUNTER — Encounter (INDEPENDENT_AMBULATORY_CARE_PROVIDER_SITE_OTHER): Payer: Self-pay | Admitting: Nurse Practitioner

## 2021-10-25 ENCOUNTER — Other Ambulatory Visit (INDEPENDENT_AMBULATORY_CARE_PROVIDER_SITE_OTHER): Payer: Self-pay | Admitting: Vascular Surgery

## 2021-10-25 ENCOUNTER — Ambulatory Visit (INDEPENDENT_AMBULATORY_CARE_PROVIDER_SITE_OTHER): Payer: Medicare Other

## 2021-10-25 ENCOUNTER — Other Ambulatory Visit: Payer: Self-pay | Admitting: Family Medicine

## 2021-10-25 ENCOUNTER — Ambulatory Visit (INDEPENDENT_AMBULATORY_CARE_PROVIDER_SITE_OTHER): Payer: Medicare Other | Admitting: Nurse Practitioner

## 2021-10-25 ENCOUNTER — Other Ambulatory Visit: Payer: Self-pay

## 2021-10-25 VITALS — BP 97/59 | HR 101 | Resp 17 | Ht 61.0 in | Wt 110.0 lb

## 2021-10-25 DIAGNOSIS — Z87891 Personal history of nicotine dependence: Secondary | ICD-10-CM | POA: Diagnosis not present

## 2021-10-25 DIAGNOSIS — I70213 Atherosclerosis of native arteries of extremities with intermittent claudication, bilateral legs: Secondary | ICD-10-CM | POA: Diagnosis not present

## 2021-10-25 DIAGNOSIS — I739 Peripheral vascular disease, unspecified: Secondary | ICD-10-CM

## 2021-10-25 DIAGNOSIS — I679 Cerebrovascular disease, unspecified: Secondary | ICD-10-CM

## 2021-10-25 DIAGNOSIS — I10 Essential (primary) hypertension: Secondary | ICD-10-CM | POA: Diagnosis not present

## 2021-10-25 DIAGNOSIS — E039 Hypothyroidism, unspecified: Secondary | ICD-10-CM

## 2021-10-25 DIAGNOSIS — I6523 Occlusion and stenosis of bilateral carotid arteries: Secondary | ICD-10-CM

## 2021-10-25 MED ORDER — THYROID 90 MG PO TABS: 90.0000 mg | ORAL_TABLET | Freq: Every day | ORAL | 1 refills | Status: DC

## 2021-10-25 MED ORDER — VITAMIN D (ERGOCALCIFEROL) 1.25 MG (50000 UNIT) PO CAPS: 50000.0000 [IU] | ORAL_CAPSULE | ORAL | 0 refills | Status: DC

## 2021-11-02 ENCOUNTER — Encounter (INDEPENDENT_AMBULATORY_CARE_PROVIDER_SITE_OTHER): Payer: Self-pay | Admitting: Nurse Practitioner

## 2021-11-02 NOTE — Progress Notes (Signed)
? ?Subjective:  ? ? Patient ID: Savannah [name], female    DOB: [birth year], 81 y.o.   MRN: [id number] ?Chief Complaint  ?Patient presents with  ? Carotid  ?  Follow up  ? ? ?Savannah Mcclure is an 81 year old female that returns today for her noninvasive studies related to her carotid disease as well as peripheral arterial disease.  The patient denies any worsening claudication-like symptoms.  She denies any rest pain.  There have been no TIA or CVA-like symptoms.  She denies any pain nor numbness or tingling in her right upper extremity.  She denies any significant dizziness.  Overall the patient has been doing well.  She also endorses being more active. ?  ?Today noninvasive studies show 1 to 39% stenosis in the right ICA with a 40 to 59% stenosis of the left ICA.  The bilateral vertebral arteries have antegrade flow however the right subclavian flow was disturbed.  Normal flow hemodynamics in the left subclavian artery ?  ?The right ABI 0.94 with a left of 0.92.  Previously on 08/25/2020 the ABI was 0.87 on the right and 0.82 on the left.  The patient has biphasic/triphasic tibial artery waveforms with good toe waveforms bilaterally. ? ? ?Review of Systems  ?Eyes:  Positive for visual disturbance (right eye blindness).  ?All other systems reviewed and are negative. ? ?   ?Objective:  ? Physical Exam ?Vitals reviewed.  ?HENT:  ?   Head: Normocephalic.  ?Cardiovascular:  ?   Rate and Rhythm: Normal rate.  ?   Pulses: Decreased pulses.  ?Pulmonary:  ?   Effort: Pulmonary effort is normal.  ?Skin: ?   General: Skin is warm and dry.  ?Neurological:  ?   Mental Status: She is alert and oriented to person, place, and time.  ?Psychiatric:     ?   Mood and Affect: Mood normal.     ?   Behavior: Behavior normal.     ?   Thought Content: Thought content normal.     ?   Judgment: Judgment normal.  ? ? ?BP (!) 97/59 (BP Location: Left Arm)   Pulse (!) 101   Resp 17   Ht '5\' 1"'$  (1.549 m)   Wt 110 lb (49.9 kg)    BMI 20.78 kg/m?  ? ?Past Medical History:  ?Diagnosis Date  ? Abdominal distension 03/18/2012  ? Abnormal weight loss 10/30/2017  ? Acute kidney injury ([place]) 03/22/2012  ? 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 Anxiety   ? Blind right eye   ? Cardiac arrest [facility])   ? Cataract   ? right eye  ? Claudication [facility])   ? Complication of anesthesia 03/15/2012  ? "crashed" during first part of 3-part back surgery.   ? COPD (chronic obstructive pulmonary disease) ([place])   ? Delirium 03/18/2012  ? Dyspnea   ? GERD (gastroesophageal reflux disease)   ? Heart murmur   ? Hypertension   ? Leaky heart valve   ? Oxygen deficit   ? uses O2 at night 2 liters  ? Peripheral vascular disease ([place])   ? Scoliosis   ? Sepsis ([place])   ? Shock ([place]) 03/23/2012  ? SIRS (systemic inflammatory response syndrome) ([place]) 03/23/2012  ? Spinal stenosis   ? Stroke [facility])   ? Eye  ? Tachycardia   ? ? ?Social History  ? ?Socioeconomic History  ? Marital status: Widowed  ?  Spouse name: Not on file  ? Number of  children: 2  ? Years of education: college   ? Highest education level: Not on file  ?Occupational History  ? Occupation: retired  ?Tobacco Use  ? Smoking status: Every Day  ?  Packs/day: 1.00  ?  Years: 40.00  ?  Pack years: 40.00  ?  Types: Cigarettes  ? Smokeless tobacco: Never  ? Tobacco comments:  ?  0.5PPD 08/31/2021  ?Vaping Use  ? Vaping Use: Former  ?Substance and Sexual Activity  ? Alcohol use: Yes  ?  Comment: wine on occasion (1x/month)  ? Drug use: No  ? Sexual activity: Not Currently  ?Other Topics Concern  ? Not on file  ?Social History Narrative  ? Lives with Barry(son-in-law), ElizDaughter. And grandkids (16 & 49 y.o.)  ? ?Social Determinants of Health  ? ?Financial Resource Strain: Medium Risk  ? Difficulty of Paying Living Expenses: Somewhat hard  ?Food Insecurity: Food Insecurity Present  ? Worried About Charity fundraiser in the Last Year: Sometimes true  ? Ran Out of Food in the Last Year: Sometimes true  ?Transportation Needs: No  Transportation Needs  ? Lack of Transportation (Medical): No  ? Lack of Transportation (Non-Medical): No  ?Physical Activity: Inactive  ? Days of Exercise per Week: 0 days  ? Minutes of Exercise per Session: 0 min  ?Stress: Stress Concern Present  ? Feeling of Stress : To some extent  ?Social Connections: Socially Isolated  ? Frequency of Communication with Friends and Family: More than three times a week  ? Frequency of Social Gatherings with Friends and Family: Three times a week  ? Attends Religious Services: Never  ? Active Member of Clubs or Organizations: No  ? Attends Archivist Meetings: Never  ? Marital Status: Widowed  ?Intimate Partner Violence: Not At Risk  ? Fear of Current or Ex-Partner: No  ? Emotionally Abused: No  ? Physically Abused: No  ? Sexually Abused: No  ? ? ?Past Surgical History:  ?Procedure Laterality Date  ? BACK SURGERY    ? BREAST LUMPECTOMY Left   ? CATARACT EXTRACTION W/PHACO Left 08/14/2019  ? Procedure: CATARACT EXTRACTION PHACO AND INTRAOCULAR LENS PLACEMENT (IOC) LEFT 10.22 00:59.7 42.4%;  Surgeon: Leandrew Koyanagi, MD;  Location: [facility];  Service: Ophthalmology;  Laterality: Left;  leave it last patient per [place]  ? LOWER EXTREMITY ANGIOGRAPHY Right 06/20/2018  ? Procedure: LOWER EXTREMITY ANGIOGRAPHY;  Surgeon: Algernon Huxley, MD;  Location: [place] [facility];  Service: Cardiovascular;  Laterality: Right;  ? LOWER EXTREMITY ANGIOGRAPHY Left 02/03/2020  ? Procedure: LOWER EXTREMITY ANGIOGRAPHY;  Surgeon: Algernon Huxley, MD;  Location: [place] [facility];  Service: Cardiovascular;  Laterality: Left;  ? right eye surgery    ? 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 TOTAL ABDOMINAL HYSTERECTOMY    ? total  ? WISDOM TOOTH EXTRACTION    ? ? ?Family History  ?Problem Relation Age of Onset  ? Intracerebral hemorrhage Mother   ? Hypertension Mother   ? Heart failure Father   ? Schizophrenia Daughter   ? Rheum arthritis Maternal Grandmother   ? Kidney disease  Maternal Grandmother   ? Heart disease Paternal Grandmother   ? Stroke Paternal Grandmother   ? Stroke Paternal Grandfather   ? Heart disease Paternal Grandfather   ? ? ?Allergies  ?Allergen Reactions  ? Other Itching  ?  Cat Gut sutures   ? Oxycodone Hives  ? Amlodipine Hives  ? Augmentin [Amoxicillin-Pot Clavulanate] Diarrhea  ?  Azithromycin Hives  ? Contrast Media [Iodinated Contrast Media] Hives  ? Doxycycline Diarrhea  ? Erythromycin Diarrhea  ? Hydrocodone Nausea Only  ? Lactose Intolerance (Gi) Diarrhea  ?  Bloating  ? Minocycline Diarrhea  ? Nsaids Other (See Comments)  ?  Internal Bleeding  ? Relafen [Nabumetone] Hives  ? Sulfa Antibiotics Hives  ? Synthroid [Levothyroxine] Diarrhea  ? Tetracyclines & Related Diarrhea  ? Tilactase   ? Tramadol Rash  ?  Takes +/- Benadryl  ? ? ?CBC Latest Ref Rng & Units 10/19/2021 07/20/2021 07/13/2021  ?WBC 3.4 - 10.8 x10E3/uL 10.7 19.4(H) 19.4(H)  ?Hemoglobin 11.1 - 15.9 g/dL 13.2 14.7 10.1(L)  ?Hematocrit 34.0 - 46.6 % 39.6 42.8 29.8(L)  ?Platelets 150 - 450 x10E3/uL 444 704(H) 547(H)  ? ? ? ? ?CMP  ?   ?Component Value Date/Time  ? NA 139 10/19/2021 1329  ? NA 132 (L) 09/12/2012 1540  ? K 4.0 10/19/2021 1329  ? K 3.8 09/11/2012 0401  ? CL 100 10/19/2021 1329  ? CL 99 09/11/2012 0401  ? CO2 24 10/19/2021 1329  ? CO2 24 09/11/2012 0401  ? GLUCOSE 171 (H) 10/19/2021 1329  ? GLUCOSE 137 (H) 07/12/2021 0533  ? GLUCOSE 262 (H) 09/11/2012 0401  ? BUN 14 10/19/2021 1329  ? BUN 22 (H) 09/11/2012 0401  ? CREATININE 0.72 10/19/2021 1329  ? CREATININE 0.86 09/11/2012 0401  ? CALCIUM 9.0 10/19/2021 1329  ? CALCIUM 8.5 09/11/2012 0401  ? PROT 6.3 10/19/2021 1329  ? ALBUMIN 4.3 10/19/2021 1329  ? AST 13 10/19/2021 1329  ? ALT 11 10/19/2021 1329  ? ALKPHOS 73 10/19/2021 1329  ? BILITOT <0.2 10/19/2021 1329  ? GFRNONAA >60 07/12/2021 0533  ? GFRNONAA >60 09/11/2012 0401  ? GFRAA 90 09/10/2020 1409  ? GFRAA >60 09/11/2012 0401  ? ? ? ?VAS [place] ABI WITH/WO TBI ? ?Result Date: 11/01/2021 ? LOWER  EXTREMITY DOPPLER STUDY Patient Name:  Savannah [name]  Date of Exam:   10/25/2021 Medical Rec #: [id number]                 Accession #:    [id number] Date of Birth: [birth year]                 Patient Gender: F

## 2021-11-03 DIAGNOSIS — L988 Other specified disorders of the skin and subcutaneous tissue: Secondary | ICD-10-CM | POA: Diagnosis not present

## 2021-11-03 DIAGNOSIS — D235 Other benign neoplasm of skin of trunk: Secondary | ICD-10-CM | POA: Diagnosis not present

## 2021-11-15 ENCOUNTER — Other Ambulatory Visit: Payer: Self-pay | Admitting: Student in an Organized Health Care Education/Training Program

## 2021-11-23 ENCOUNTER — Other Ambulatory Visit: Payer: Self-pay | Admitting: *Deleted

## 2021-11-23 ENCOUNTER — Ambulatory Visit
Payer: Medicare Other | Attending: Student in an Organized Health Care Education/Training Program | Admitting: Student in an Organized Health Care Education/Training Program

## 2021-11-23 ENCOUNTER — Encounter: Payer: Self-pay | Admitting: Student in an Organized Health Care Education/Training Program

## 2021-11-23 ENCOUNTER — Telehealth: Payer: Self-pay | Admitting: Student in an Organized Health Care Education/Training Program

## 2021-11-23 DIAGNOSIS — G894 Chronic pain syndrome: Secondary | ICD-10-CM | POA: Diagnosis not present

## 2021-11-23 DIAGNOSIS — M4120 Other idiopathic scoliosis, site unspecified: Secondary | ICD-10-CM | POA: Diagnosis not present

## 2021-11-23 DIAGNOSIS — M47814 Spondylosis without myelopathy or radiculopathy, thoracic region: Secondary | ICD-10-CM | POA: Diagnosis not present

## 2021-11-23 DIAGNOSIS — M47816 Spondylosis without myelopathy or radiculopathy, lumbar region: Secondary | ICD-10-CM

## 2021-11-23 DIAGNOSIS — M5136 Other intervertebral disc degeneration, lumbar region: Secondary | ICD-10-CM

## 2021-11-23 MED ORDER — TRAMADOL HCL 50 MG PO TABS: 50.0000 mg | ORAL_TABLET | Freq: Four times a day (QID) | ORAL | 5 refills | Status: AC | PRN

## 2021-11-23 NOTE — Telephone Encounter (Signed)
Patient stated that her pharmacy send over an request last week for her meds to be refilled. Pt stated she is almost out. Please give patient a call. Thanks ?

## 2021-11-23 NOTE — Progress Notes (Signed)
Patient: Savannah Mcclure  Service Category: E/M  Provider: Gillis Santa, MD  ?DOB: [birth year]  DOS: 11/23/2021  Location: Office  ?MRN: [id number]  Setting: Ambulatory outpatient  Referring Provider: [name], DO  ?Type: Established Patient  Specialty: Interventional Pain Management  PCP: [name], DO  ?Location: Home  Delivery: TeleHealth    ? ?Virtual Encounter - Pain Management ?PROVIDER NOTE: Information contained herein reflects review and annotations entered in association with encounter. Interpretation of such information and data should be left to medically-trained personnel. Information provided to patient can be located elsewhere in the medical record under "Patient Instructions". Document created using STT-dictation technology, any transcriptional errors that may result from process are unintentional.  ?  ?Contact & Pharmacy ?Preferred: [phone] ?Home: [phone] (home) ?Mobile: [phone] (mobile) ?E-mail: [email or web address]  ?[facility] - [place], [place] ?[address] ?[place] [address] ?Phone: [phone] Fax: [phone] ? ?[place], [place] - [address] ?[address] ?[place] [address] ?Phone: [phone] Fax: [phone] ? ?[place], [place] ?[address] ?[place] [address] ?Phone: [phone] Fax: [phone] ? ?  ?Pre-screening  ?[name] offered "in-person" vs "virtual" encounter. She indicated preferring virtual for this encounter.  ? ?Reason ?COVID-19*  Social distancing based on CDC and AMA recommendations.  ? ?I contacted [place] on 11/23/2021 via telephone.      I clearly identified myself as Gillis Santa, MD. I verified that I was speaking with the correct person using two identifiers (Name: Savannah Mcclure, and date of birth: Mar 25, 1941). ? ?Consent ?I sought verbal  advanced consent from [place] for virtual visit interactions. I informed [name] of possible security and privacy concerns, risks, and limitations associated with providing "not-in-person" medical evaluation and management services. I also informed [name] of the availability of "in-person" appointments. Finally, I informed her that there would be a charge for the virtual visit and that she could be  personally, fully or partially, financially responsible for it. [name] expressed understanding and agreed to proceed.  ? ?Historic Elements   ?[name] is a 81 y.o. year old, female patient evaluated today after our last contact on 06/15/2021. [name]  has a past medical history of Abdominal distension (03/18/2012), Abnormal weight loss (10/30/2017), Acute kidney injury ([place]) (03/22/2012), Aneurysm ([place]), Anxiety, Blind right eye, Cardiac arrest ([place]), Cataract, Claudication ([place]), Complication of anesthesia (03/15/2012), COPD (chronic obstructive pulmonary disease) ([place]), Delirium (03/18/2012), Dyspnea, GERD (gastroesophageal reflux disease), Heart murmur, Hypertension, Leaky heart valve, Oxygen deficit, Peripheral vascular disease ([place]), Scoliosis, Sepsis ([place]), Shock ([place]) (03/23/2012), SIRS (systemic inflammatory response syndrome) ([place]) (03/23/2012), Spinal stenosis, Stroke ([place]), and Tachycardia. She also  has a past surgical history that includes right eye surgery; Wisdom tooth extraction; Back surgery; Total abdominal hysterectomy; Breast lumpectomy (Left); Lower Extremity Angiography (Right, 06/20/2018); Cataract extraction w/PHACO (Left, 08/14/2019); and Lower Extremity Angiography (Left, 02/03/2020). [name] has a current medication list which includes the following prescription(s): acetaminophen, albuterol, atenolol, b complex-c, benazepril, cilostazol, cyclobenzaprine, diphenhydramine, fluticasone, trelegy ellipta, gabapentin,  hydroxyzine, lidocaine, misc natural products, montelukast, multivitamin with minerals, niacinamide, omeprazole, thyroid, vitamin d (ergocalciferol), and tramadol, and the following Facility-Administered Medications: albuterol. She  reports that she has been smoking cigarettes. She has a 40.00 pack-year smoking history. She has never used smokeless tobacco. She reports  current alcohol use. She reports that she does not use drugs. [name] is allergic to other, oxycodone, amlodipine, augmentin [amoxicillin-pot clavulanate], azithromycin, contrast media [iodinated contrast media], doxycycline, erythromycin, hydrocodone, lactose intolerance (gi), minocycline, nsaids, relafen [nabumetone], sulfa antibiotics, synthroid [levothyroxine], tetracyclines & related, tilactase, and tramadol.  ? ?HPI  ?Today, she is being contacted for medication management. ? ?No change in medical history since last visit.  Patient's pain is at baseline.  Patient continues multimodal pain regimen as prescribed.  States that it provides pain relief and improvement in functional status. ?Non-opioid analgesics managed by patient's primary care provider. ? ?Pharmacotherapy Assessment  ? ?Analgesic: Tramadol 50 mg QID prn  ? ?Monitoring: ?[place] PMP: PDMP reviewed during this encounter.       ?Pharmacotherapy: No side-effects or adverse reactions reported. ?Compliance: No problems identified. ?Effectiveness: Clinically acceptable. ?Plan: Refer to "POC". UDS:  ?Summary  ?Date Value Ref Range Status  ?04/24/2019 Note  Final  ?  Comment:  ?  ==================================================================== ?[employer] Select 13 (MW) ?==================================================================== ?Test                             Result       Flag       Units ?Drug Absent but Declared for Prescription Verification ?  Tramadol                       Not Detected UNEXPECTED ng/mg  creat ?==================================================================== ?Test                      Result    Flag   Units      Ref Range ?  Creatinine              53               mg/dL      >=20 ?==================================================================== ?Declared Medications: ? The flagging and interpretation on this report are based on the ? following declared medications.  Unexpected results may arise from ? inaccuracies in the declared medications. ? **Note: The testing scope of this panel includes these medications: ? Tramadol ? **Note: The testing scope of this panel does not include the ? following reported medications: ? Albuterol (Duoneb) ? Benazepril ? Cefuroxime ? Cilostazol (Pletal) ? Cyclobenzaprine ? Gabapentin ? Hydroxyzine ? Ipratropium (Duoneb) ? Magnesium ? Montelukast ? Multivitamin ? Niacinamide ? Omeprazole ? Potassium ? Supplement ? Tiotropium ? Topical Lidocaine ? Vitamin B ?==================================================================== ?For clinical consultation, please call [phone]. ?==================================================================== ?  ?  ? ?Laboratory Chemistry Profile  ? ?Renal ?Lab Results  ?Component Value Date  ? BUN 14 10/19/2021  ? CREATININE 0.72 10/19/2021  ? BCR 19 10/19/2021  ? GFRAA 90 09/10/2020  ? GFRNONAA >60 07/12/2021  ?  Hepatic ?Lab Results  ?Component Value Date  ? AST 13 10/19/2021  ? ALT 11 10/19/2021  ? ALBUMIN 4.3 10/19/2021  ? ALKPHOS 73 10/19/2021  ?  ?Electrolytes ?Lab Results  ?Component Value Date  ? NA 139 10/19/2021  ? K 4.0 10/19/2021  ? CL 100 10/19/2021  ? CALCIUM 9.0 10/19/2021  ? MG 2.2 07/20/2021  ? PHOS 3.2 10/13/2017  ?  Bone ?Lab Results  ?Component Value Date  ? VD25OH 12.5 (L) 10/19/2021  ?  ?Inflammation (CRP: Acute Phase) (ESR: Chronic Phase) ?Lab Results  ?Component Value Date  ? LATICACIDVEN 1.6 07/10/2021  ?    ?  ? ?  Note: Above Lab results reviewed. ? ?Imaging  ?VAS [place] ABI WITH/WO TBI ? LOWER  EXTREMITY DOPPLER STUDY ? ?Patient Name:  Savannah Mcclure  Date of Exam:   10/25/2021 ?Medical Rec #: [id number]                 Accession #:    [id number] ?Date of Birth: 1940/12/06                 Patient Gender: F ?Patient Age:   81 years ?Exam Location:  [facility]

## 2021-11-25 ENCOUNTER — Telehealth: Payer: Self-pay | Admitting: Student in an Organized Health Care Education/Training Program

## 2021-11-25 NOTE — Telephone Encounter (Signed)
Patients stated her lidocaine patch has been on back order at the pharmacy she use. Can you give patient a call. Thanks ?

## 2021-11-25 NOTE — Telephone Encounter (Signed)
Called patient to let her know that BL suggest using 4% patches.  Patient would like to know if he could try [facility].  I ask if she knew if they had them and she does not know.  She will call and check with them and give [place] a call back if they do have in stock.  Patient is aware that BL is not here and it will be next week before he could do this.  Patient verbalizes u/o information.  ?

## 2021-11-25 NOTE — Telephone Encounter (Signed)
Patient called and states that [place] has told her that the lidocaine patches are on back order.  They have told patient that they are being discontinued.  She would like to know if there is anything comparable.  Told her I would check with Dr Holley Raring.  At the end of conversation she states that they do have the 4% but no 5% ?

## 2021-11-26 ENCOUNTER — Telehealth: Payer: Self-pay | Admitting: Student in an Organized Health Care Education/Training Program

## 2021-11-26 ENCOUNTER — Other Ambulatory Visit: Payer: Self-pay | Admitting: *Deleted

## 2021-11-26 MED ORDER — LIDOCAINE 5 % EX PTCH: 1.0000 | MEDICATED_PATCH | CUTANEOUS | 6 refills | Status: DC

## 2021-11-26 NOTE — Telephone Encounter (Signed)
Patient aware.

## 2021-11-26 NOTE — Telephone Encounter (Signed)
Medication request sent to Dr Holley Raring.  And he sent it.  ?

## 2021-11-26 NOTE — Telephone Encounter (Signed)
Patient lvmail at 3:50 on 11-25-21 ?Says she was talking to [facility] about her lidocaine patches. [facility] does have them, would like a script sent to them. Please call patient ?

## 2021-11-29 ENCOUNTER — Other Ambulatory Visit: Payer: Medicare Other

## 2021-11-29 DIAGNOSIS — E039 Hypothyroidism, unspecified: Secondary | ICD-10-CM

## 2021-11-30 ENCOUNTER — Other Ambulatory Visit: Payer: Self-pay | Admitting: Family Medicine

## 2021-11-30 DIAGNOSIS — E039 Hypothyroidism, unspecified: Secondary | ICD-10-CM

## 2021-11-30 LAB — TSH: TSH: 7.73 u[IU]/mL — ABNORMAL HIGH (ref 0.450–4.500)

## 2021-11-30 MED ORDER — THYROID 120 MG PO TABS: 120.0000 mg | ORAL_TABLET | Freq: Every day | ORAL | 1 refills | Status: DC

## 2022-01-13 ENCOUNTER — Other Ambulatory Visit: Payer: Medicare Other

## 2022-01-13 DIAGNOSIS — E039 Hypothyroidism, unspecified: Secondary | ICD-10-CM | POA: Diagnosis not present

## 2022-01-14 ENCOUNTER — Other Ambulatory Visit: Payer: Self-pay | Admitting: Family Medicine

## 2022-01-14 DIAGNOSIS — E039 Hypothyroidism, unspecified: Secondary | ICD-10-CM

## 2022-01-14 LAB — TSH: TSH: 0.101 u[IU]/mL — ABNORMAL LOW (ref 0.450–4.500)

## 2022-01-14 MED ORDER — THYROID 120 MG PO TABS: 120.0000 mg | ORAL_TABLET | ORAL | 1 refills | Status: DC

## 2022-01-14 MED ORDER — THYROID 90 MG PO TABS: 90.0000 mg | ORAL_TABLET | ORAL | 1 refills | Status: DC

## 2022-02-10 ENCOUNTER — Other Ambulatory Visit: Payer: Self-pay | Admitting: Pulmonary Disease

## 2022-03-01 ENCOUNTER — Other Ambulatory Visit: Payer: Medicare Other

## 2022-03-08 ENCOUNTER — Other Ambulatory Visit: Payer: Medicare Other

## 2022-03-14 ENCOUNTER — Encounter: Payer: Self-pay | Admitting: Nurse Practitioner

## 2022-03-14 ENCOUNTER — Ambulatory Visit (INDEPENDENT_AMBULATORY_CARE_PROVIDER_SITE_OTHER): Payer: Medicare Other | Admitting: Nurse Practitioner

## 2022-03-14 ENCOUNTER — Other Ambulatory Visit: Payer: Medicare Other

## 2022-03-14 VITALS — BP 140/73 | HR 88 | Temp 98.6°F

## 2022-03-14 DIAGNOSIS — E039 Hypothyroidism, unspecified: Secondary | ICD-10-CM

## 2022-03-14 DIAGNOSIS — J0110 Acute frontal sinusitis, unspecified: Secondary | ICD-10-CM

## 2022-03-14 DIAGNOSIS — J988 Other specified respiratory disorders: Secondary | ICD-10-CM

## 2022-03-14 DIAGNOSIS — J029 Acute pharyngitis, unspecified: Secondary | ICD-10-CM

## 2022-03-14 DIAGNOSIS — I6523 Occlusion and stenosis of bilateral carotid arteries: Secondary | ICD-10-CM

## 2022-03-14 MED ORDER — CEPHALEXIN 500 MG PO CAPS: 500.0000 mg | ORAL_CAPSULE | Freq: Three times a day (TID) | ORAL | 0 refills | Status: AC

## 2022-03-14 NOTE — Progress Notes (Signed)
BP 140/73   Pulse 88   Temp 98.6 F (37 C) (Oral)   SpO2 96%    Subjective:    Patient ID: Savannah Mcclure, female    DOB: Dec 28, 1940, 81 y.o.   MRN: [id number]  HPI: Savannah Mcclure is a 81 y.o. female  Chief Complaint  Patient presents with   Sore Throat    X 1 week, states pain is shooting upwards to her ears.    UPPER RESPIRATORY TRACT INFECTION Worst symptom: sore throat x 1 week Fever: no Cough: yes Shortness of breath: yes Wheezing: yes Chest pain: no Chest tightness: no Chest congestion: no Nasal congestion: yes Runny nose: yes Post nasal drip: yes Sneezing: no Sore throat: yes Swollen glands:  slightly Sinus pressure: yes Headache: no Face pain: yes Toothache: no Ear pain: yes bilateral Ear pressure: yes bilateral Eyes red/itching:no Eye drainage/crusting: no  Vomiting: no Rash: no Fatigue: yes Sick contacts: no Strep contacts: no  Context: stable Recurrent sinusitis: no Relief with OTC cold/cough medications: no  Treatments attempted: none   Relevant past medical, surgical, family and social history reviewed and updated as indicated. Interim medical history since our last visit reviewed. Allergies and medications reviewed and updated.  Review of Systems  Constitutional:  Positive for fatigue. Negative for fever.  HENT:  Positive for congestion, ear pain, postnasal drip, rhinorrhea, sinus pressure and sore throat. Negative for dental problem, sinus pain and sneezing.   Respiratory:  Positive for cough, shortness of breath and wheezing.   Cardiovascular:  Negative for chest pain.  Gastrointestinal:  Negative for vomiting.  Skin:  Negative for rash.  Neurological:  Negative for headaches.    Per HPI unless specifically indicated above     Objective:    BP 140/73   Pulse 88   Temp 98.6 F (37 C) (Oral)   SpO2 96%   Wt Readings from Last 3 Encounters:  10/25/21 110 lb (49.9 kg)  10/19/21 109 lb 3.2 oz (49.5 kg)   08/31/21 105 lb 12.8 oz (48 kg)    Physical Exam Vitals and nursing note reviewed.  Constitutional:      General: She is not in acute distress.    Appearance: Normal appearance. She is normal weight. She is not ill-appearing, toxic-appearing or diaphoretic.  HENT:     Head: Normocephalic.     Right Ear: Tympanic membrane and external ear normal.     Left Ear: Tympanic membrane and external ear normal.     Nose: Congestion and rhinorrhea present.     Right Sinus: Maxillary sinus tenderness present. No frontal sinus tenderness.     Left Sinus: Maxillary sinus tenderness present. No frontal sinus tenderness.     Mouth/Throat:     Mouth: Mucous membranes are moist.     Pharynx: Oropharynx is clear. Posterior oropharyngeal erythema present. No oropharyngeal exudate.     Tonsils: No tonsillar exudate or tonsillar abscesses.  Eyes:     General:        Right eye: No discharge.        Left eye: No discharge.     Extraocular Movements: Extraocular movements intact.     Conjunctiva/sclera: Conjunctivae normal.     Pupils: Pupils are equal, round, and reactive to light.  Cardiovascular:     Rate and Rhythm: Normal rate and regular rhythm.     Heart sounds: No murmur heard. Pulmonary:     Effort: Pulmonary effort is normal. No respiratory distress.  Breath sounds: Normal breath sounds. No wheezing or rales.  Musculoskeletal:     Cervical back: Normal range of motion and neck supple.  Skin:    General: Skin is warm and dry.     Capillary Refill: Capillary refill takes less than 2 seconds.  Neurological:     General: No focal deficit present.     Mental Status: She is alert and oriented to person, place, and time. Mental status is at baseline.  Psychiatric:        Mood and Affect: Mood normal.        Behavior: Behavior normal.        Thought Content: Thought content normal.        Judgment: Judgment normal.     Results for orders placed or performed in visit on 01/13/22  TSH   Result Value Ref Range   TSH 0.101 (L) 0.450 - 4.500 uIU/mL      Assessment & Plan:   Problem List Items Addressed This Visit   None Visit Diagnoses     Acute non-recurrent frontal sinusitis    -  Primary   Complete course of antibiotics.  Increase fluid intake.  Flu and strep neg.  FU if symptoms worsen or fail to improve.    Relevant Medications   cephALEXin (KEFLEX) 500 MG capsule   Sore throat       Relevant Orders   Rapid Strep screen([facility])   Congestion of upper airway       Relevant Orders   Novel Coronavirus, NAA ([facility])   Influenza A & B (STAT)        Follow up plan: Return if symptoms worsen or fail to improve.

## 2022-03-14 NOTE — Progress Notes (Signed)
Results discussed with patient during visit.

## 2022-03-15 LAB — TSH: TSH: 76 u[IU]/mL — ABNORMAL HIGH (ref 0.450–4.500)

## 2022-03-16 LAB — NOVEL CORONAVIRUS, NAA: SARS-CoV-2, NAA: NOT DETECTED

## 2022-03-16 NOTE — Progress Notes (Signed)
Please let patient know that her COVID test was negative

## 2022-03-17 LAB — CULTURE, GROUP A STREP: Strep A Culture: NEGATIVE

## 2022-03-17 LAB — VERITOR FLU A/B WAIVED
Influenza A: NEGATIVE
Influenza B: NEGATIVE

## 2022-03-17 LAB — RAPID STREP SCREEN (MED CTR MEBANE ONLY): Strep Gp A Ag, IA W/Reflex: NEGATIVE

## 2022-03-30 ENCOUNTER — Other Ambulatory Visit: Payer: Self-pay | Admitting: Family Medicine

## 2022-03-31 NOTE — Telephone Encounter (Signed)
Requested Prescriptions  Pending Prescriptions Disp Refills  . atenolol (TENORMIN) 25 MG tablet [Pharmacy Med Name: ATENOLOL 25 MG TABLET] 90 tablet 0    Sig: Take 1 tablet (25 mg total) by mouth daily.     Cardiovascular: Beta Blockers 2 Failed - 03/30/2022  1:43 PM      Failed - Last BP in normal range    BP Readings from Last 1 Encounters:  03/14/22 140/73         Passed - Cr in normal range and within 360 days    Creatinine  Date Value Ref Range Status  09/11/2012 0.86 0.60 - 1.30 mg/dL Final   Creatinine, Ser  Date Value Ref Range Status  10/19/2021 0.72 0.57 - 1.00 mg/dL Final         Passed - Last Heart Rate in normal range    Pulse Readings from Last 1 Encounters:  03/14/22 88         Passed - Valid encounter within last 6 months    Recent Outpatient Visits          2 weeks ago Acute non-recurrent frontal sinusitis   [place], NP   5 months ago Essential hypertension   [place], [place], DO   7 months ago COPD exacerbation [facility])   [place], Megan P, DO   8 months ago COPD exacerbation [facility])   [place], [place], DO   8 months ago COPD exacerbation [facility])   [place], [place], DO      Future Appointments            In 3 weeks Wynetta Emery, Barb Merino, DO [place], [facility]

## 2022-04-07 DIAGNOSIS — Z961 Presence of intraocular lens: Secondary | ICD-10-CM | POA: Diagnosis not present

## 2022-04-21 ENCOUNTER — Ambulatory Visit: Payer: Medicare Other | Admitting: Family Medicine

## 2022-04-26 ENCOUNTER — Other Ambulatory Visit (INDEPENDENT_AMBULATORY_CARE_PROVIDER_SITE_OTHER): Payer: Self-pay | Admitting: Nurse Practitioner

## 2022-04-26 DIAGNOSIS — I6523 Occlusion and stenosis of bilateral carotid arteries: Secondary | ICD-10-CM

## 2022-04-27 ENCOUNTER — Other Ambulatory Visit: Payer: Self-pay | Admitting: Family Medicine

## 2022-04-28 NOTE — Telephone Encounter (Signed)
Requested Prescriptions  Pending Prescriptions Disp Refills  . benazepril (LOTENSIN) 10 MG tablet [Pharmacy Med Name: BENAZEPRIL HCL 10 MG TABLET] 90 tablet 0    Sig: Take 1 tablet (10 mg total) by mouth daily.     Cardiovascular:  ACE Inhibitors Failed - 04/27/2022  2:32 PM      Failed - Cr in normal range and within 180 days    Creatinine  Date Value Ref Range Status  09/11/2012 0.86 0.60 - 1.30 mg/dL Final   Creatinine, Ser  Date Value Ref Range Status  10/19/2021 0.72 0.57 - 1.00 mg/dL Final         Failed - K in normal range and within 180 days    Potassium  Date Value Ref Range Status  10/19/2021 4.0 3.5 - 5.2 mmol/L Final  09/11/2012 3.8 3.5 - 5.1 mmol/L Final         Failed - Last BP in normal range    BP Readings from Last 1 Encounters:  03/14/22 140/73         Passed - Patient is not pregnant      Passed - Valid encounter within last 6 months    Recent Outpatient Visits          1 month ago Acute non-recurrent frontal sinusitis   [place], NP   6 months ago Essential hypertension   [place], Megan P, DO   8 months ago COPD exacerbation [facility])   [place], Megan P, DO   9 months ago COPD exacerbation [facility])   [place], Megan P, DO   9 months ago COPD exacerbation [facility])   [place], [place], DO      Future Appointments            In 2 weeks Wynetta Emery, Barb Merino, DO [place], [facility]

## 2022-04-29 ENCOUNTER — Encounter (INDEPENDENT_AMBULATORY_CARE_PROVIDER_SITE_OTHER): Payer: Medicare Other

## 2022-04-29 ENCOUNTER — Ambulatory Visit (INDEPENDENT_AMBULATORY_CARE_PROVIDER_SITE_OTHER): Payer: Medicare Other | Admitting: Nurse Practitioner

## 2022-05-13 ENCOUNTER — Ambulatory Visit: Payer: Medicare Other | Admitting: Family Medicine

## 2022-05-17 DIAGNOSIS — D485 Neoplasm of uncertain behavior of skin: Secondary | ICD-10-CM | POA: Diagnosis not present

## 2022-05-17 DIAGNOSIS — Z872 Personal history of diseases of the skin and subcutaneous tissue: Secondary | ICD-10-CM | POA: Diagnosis not present

## 2022-05-17 DIAGNOSIS — L578 Other skin changes due to chronic exposure to nonionizing radiation: Secondary | ICD-10-CM | POA: Diagnosis not present

## 2022-05-17 DIAGNOSIS — Z86018 Personal history of other benign neoplasm: Secondary | ICD-10-CM | POA: Diagnosis not present

## 2022-05-17 DIAGNOSIS — L989 Disorder of the skin and subcutaneous tissue, unspecified: Secondary | ICD-10-CM | POA: Diagnosis not present

## 2022-05-17 DIAGNOSIS — L986 Other infiltrative disorders of the skin and subcutaneous tissue: Secondary | ICD-10-CM | POA: Diagnosis not present

## 2022-05-17 DIAGNOSIS — D225 Melanocytic nevi of trunk: Secondary | ICD-10-CM | POA: Diagnosis not present

## 2022-05-19 ENCOUNTER — Encounter: Payer: Self-pay | Admitting: Student in an Organized Health Care Education/Training Program

## 2022-05-19 ENCOUNTER — Ambulatory Visit: Payer: Medicare Other | Admitting: Student in an Organized Health Care Education/Training Program

## 2022-05-19 NOTE — Progress Notes (Unsigned)
Nursing Pain Medication Assessment:  Safety precautions to be maintained throughout the outpatient stay will include: orient to surroundings, keep bed in low position, maintain call bell within reach at all times, provide assistance with transfer out of bed and ambulation.  Medication Inspection Compliance: Pill count conducted under aseptic conditions, in front of the patient. Neither the pills nor the bottle was removed from the patient's sight at any time. Once count was completed pills were immediately returned to the patient in their original bottle.  Medication: Tramadol (Ultram) Pill/Patch Count:  36 of 120 pills remain Pill/Patch Appearance: Markings consistent with prescribed medication Bottle Appearance: Standard pharmacy container. Clearly labeled. Filled Date: 08 / 16 / 2023 Last Medication intake:  Today Safety precautions to be maintained throughout the outpatient stay will include: orient to surroundings, keep bed in low position, maintain call bell within reach at all times, provide assistance with transfer out of bed and ambulation.

## 2022-05-26 ENCOUNTER — Ambulatory Visit: Payer: Medicare Other | Admitting: Family Medicine

## 2022-05-26 ENCOUNTER — Ambulatory Visit (INDEPENDENT_AMBULATORY_CARE_PROVIDER_SITE_OTHER): Payer: Medicare Other

## 2022-05-26 DIAGNOSIS — Z23 Encounter for immunization: Secondary | ICD-10-CM | POA: Diagnosis not present

## 2022-05-31 ENCOUNTER — Ambulatory Visit (INDEPENDENT_AMBULATORY_CARE_PROVIDER_SITE_OTHER): Payer: Medicare Other | Admitting: Family Medicine

## 2022-05-31 ENCOUNTER — Other Ambulatory Visit: Payer: Self-pay | Admitting: Student in an Organized Health Care Education/Training Program

## 2022-05-31 ENCOUNTER — Encounter: Payer: Self-pay | Admitting: Family Medicine

## 2022-05-31 ENCOUNTER — Other Ambulatory Visit: Payer: Self-pay | Admitting: Family Medicine

## 2022-05-31 VITALS — BP 119/77 | HR 92 | Temp 97.9°F | Ht 63.5 in | Wt 123.2 lb

## 2022-05-31 DIAGNOSIS — M47814 Spondylosis without myelopathy or radiculopathy, thoracic region: Secondary | ICD-10-CM

## 2022-05-31 DIAGNOSIS — D692 Other nonthrombocytopenic purpura: Secondary | ICD-10-CM

## 2022-05-31 DIAGNOSIS — E039 Hypothyroidism, unspecified: Secondary | ICD-10-CM | POA: Diagnosis not present

## 2022-05-31 DIAGNOSIS — R34 Anuria and oliguria: Secondary | ICD-10-CM | POA: Diagnosis not present

## 2022-05-31 DIAGNOSIS — M4120 Other idiopathic scoliosis, site unspecified: Secondary | ICD-10-CM

## 2022-05-31 DIAGNOSIS — I700 Atherosclerosis of aorta: Secondary | ICD-10-CM | POA: Diagnosis not present

## 2022-05-31 DIAGNOSIS — I25118 Atherosclerotic heart disease of native coronary artery with other forms of angina pectoris: Secondary | ICD-10-CM

## 2022-05-31 DIAGNOSIS — G894 Chronic pain syndrome: Secondary | ICD-10-CM

## 2022-05-31 DIAGNOSIS — Z780 Asymptomatic menopausal state: Secondary | ICD-10-CM | POA: Diagnosis not present

## 2022-05-31 DIAGNOSIS — I6523 Occlusion and stenosis of bilateral carotid arteries: Secondary | ICD-10-CM

## 2022-05-31 DIAGNOSIS — E785 Hyperlipidemia, unspecified: Secondary | ICD-10-CM

## 2022-05-31 DIAGNOSIS — M47816 Spondylosis without myelopathy or radiculopathy, lumbar region: Secondary | ICD-10-CM

## 2022-05-31 DIAGNOSIS — I10 Essential (primary) hypertension: Secondary | ICD-10-CM

## 2022-05-31 DIAGNOSIS — M5136 Other intervertebral disc degeneration, lumbar region: Secondary | ICD-10-CM

## 2022-05-31 LAB — URINALYSIS, ROUTINE W REFLEX MICROSCOPIC
Bilirubin, UA: NEGATIVE
Glucose, UA: NEGATIVE
Ketones, UA: NEGATIVE
Leukocytes,UA: NEGATIVE
Nitrite, UA: NEGATIVE
Protein,UA: NEGATIVE
RBC, UA: NEGATIVE
Specific Gravity, UA: 1.02 (ref 1.005–1.030)
Urobilinogen, Ur: 1 mg/dL (ref 0.2–1.0)
pH, UA: 6 (ref 5.0–7.5)

## 2022-05-31 LAB — MICROALBUMIN, URINE WAIVED
Creatinine, Urine Waived: 100 mg/dL (ref 10–300)
Microalb, Ur Waived: 10 mg/L (ref 0–19)
Microalb/Creat Ratio: <30 mg/g (ref <30)

## 2022-05-31 MED ORDER — GABAPENTIN 300 MG PO CAPS: 300.0000 mg | ORAL_CAPSULE | Freq: Two times a day (BID) | ORAL | 6 refills | Status: DC

## 2022-05-31 MED ORDER — ATENOLOL 25 MG PO TABS: 25.0000 mg | ORAL_TABLET | Freq: Every day | ORAL | 1 refills | Status: DC

## 2022-05-31 MED ORDER — BENAZEPRIL HCL 10 MG PO TABS: 10.0000 mg | ORAL_TABLET | Freq: Every day | ORAL | 1 refills | Status: DC

## 2022-05-31 MED ORDER — CILOSTAZOL 100 MG PO TABS: 100.0000 mg | ORAL_TABLET | Freq: Two times a day (BID) | ORAL | 1 refills | Status: DC

## 2022-05-31 MED ORDER — ALBUTEROL SULFATE HFA 108 (90 BASE) MCG/ACT IN AERS: 2.0000 | INHALATION_SPRAY | Freq: Four times a day (QID) | RESPIRATORY_TRACT | 6 refills | Status: DC | PRN

## 2022-05-31 NOTE — Assessment & Plan Note (Signed)
Reassured patient. Continue to monitor.  

## 2022-05-31 NOTE — Patient Instructions (Signed)
Please call to schedule your bone density: [facility]  Address: [address], [place], [place] [address] Phone: [phone]  [facility] [place] [address] [place],  [place]  [address] Phone: [phone]   

## 2022-05-31 NOTE — Assessment & Plan Note (Signed)
Rechecking labs today. Await results. Treat as needed.  °

## 2022-05-31 NOTE — Telephone Encounter (Signed)
Unable to refill per protocol, last refill by provider 05/31/22. Will refuse duplicate request.  Requested Prescriptions  Pending Prescriptions Disp Refills  . atenolol (TENORMIN) 25 MG tablet [Pharmacy Med Name: ATENOLOL 25 MG TABLET] 90 tablet 0    Sig: Take 1 tablet (25 mg total) by mouth daily.     Cardiovascular: Beta Blockers 2 Failed - 05/31/2022  1:12 PM      Failed - Last BP in normal range    BP Readings from Last 1 Encounters:  05/31/22 119/77         Passed - Cr in normal range and within 360 days    Creatinine  Date Value Ref Range Status  09/11/2012 0.86 0.60 - 1.30 mg/dL Final   Creatinine, Ser  Date Value Ref Range Status  10/19/2021 0.72 0.57 - 1.00 mg/dL Final         Passed - Last Heart Rate in normal range    Pulse Readings from Last 1 Encounters:  05/31/22 92         Passed - Valid encounter within last 6 months    Recent Outpatient Visits          Today Hypothyroidism, unspecified type   [place], Megan P, DO   2 months ago Acute non-recurrent frontal sinusitis   [place], NP   7 months ago Essential hypertension   [place], Megan P, DO   9 months ago COPD exacerbation [facility])   [place], Megan P, DO   10 months ago COPD exacerbation [facility])   [place], [place], DO      Future Appointments            In 1 month [place], Megan P, DO [place], [facility]           . cilostazol (PLETAL) 100 MG tablet [Pharmacy Med Name: CILOSTAZOL 100 MG TABLET] 180 tablet 0    Sig: Take 1 tablet (100 mg total) by mouth 2 (two) times daily.     Hematology: Antiplatelets - cilostazol Passed - 05/31/2022  1:12 PM      Passed - PLT in normal range and within 360 days    Platelets  Date Value Ref Range Status  10/19/2021 444 150 - 450 x10E3/uL Final         Passed - WBC in normal range and within 360 days    WBC  Date  Value Ref Range Status  10/19/2021 10.7 3.4 - 10.8 x10E3/uL Final  07/13/2021 19.4 (H) 4.0 - 10.5 K/uL Final         Passed - Valid encounter within last 6 months    Recent Outpatient Visits          Today Hypothyroidism, unspecified type   [place], Megan P, DO   2 months ago Acute non-recurrent frontal sinusitis   [place], NP   7 months ago Essential hypertension   [place], Megan P, DO   9 months ago COPD exacerbation [facility])   [place], Megan P, DO   10 months ago COPD exacerbation [facility])   [place], [place], DO      Future Appointments            In 1 month Johnson, Barb Merino, DO [place], [facility]

## 2022-05-31 NOTE — Assessment & Plan Note (Signed)
Continue to follow with cardiology. Will keep BP and cholesterol under good control. Call with any concerns.  

## 2022-05-31 NOTE — Assessment & Plan Note (Signed)
Under good control on current regimen. Continue current regimen. Continue to monitor. Call with any concerns. Refills given. Labs drawn today.   

## 2022-05-31 NOTE — Assessment & Plan Note (Signed)
Has not been taking her medicine for 2+ months. Will check labs and treat as needed. Call with any concerns.

## 2022-05-31 NOTE — Progress Notes (Signed)
BP 119/77   Pulse 92   Temp 97.9 F (36.6 C)   Ht 5' 3.5" (1.613 m)   Wt 123 lb 3.2 oz (55.9 kg)   SpO2 95%   BMI 21.48 kg/m    Subjective:    Patient ID: Savannah Mcclure, female    DOB: 18-Feb-1941, 81 y.o.   MRN: [id number]  HPI: Savannah Mcclure is a 81 y.o. female  Chief Complaint  Patient presents with   Hypothyroidism   Hypertension   Coronary Artery Disease   HYPOTHYROIDISM- has not been taking her thyroid medicine in about a month or 2 Thyroid control status:uncontrolled Satisfied with current treatment? no Medication side effects: no Medication compliance: poor compliance Recent dose adjustment:stopped her medicine on her own Fatigue: yes Cold intolerance: no Heat intolerance: no Weight gain: yes Weight loss: no Constipation: no Diarrhea/loose stools: no Palpitations: no Lower extremity edema: no Anxiety/depressed mood: no  HYPERTENSION / HYPERLIPIDEMIA Satisfied with current treatment? yes Duration of hypertension: chronic BP monitoring frequency: not checking BP medication side effects: no Past BP meds: atenolol, benazepril Duration of hyperlipidemia: chronic Cholesterol medication side effects: not on anything Cholesterol supplements: none Past cholesterol medications: none Medication compliance: excellent compliance Aspirin: no Recent stressors: no Recurrent headaches: no Visual changes: no Palpitations: no Dyspnea: no Chest pain: no Lower extremity edema: no Dizzy/lightheaded: no  Relevant past medical, surgical, family and social history reviewed and updated as indicated. Interim medical history since our last visit reviewed. Allergies and medications reviewed and updated.  Review of Systems  Constitutional: Negative.   Respiratory: Negative.    Cardiovascular: Negative.   Gastrointestinal: Negative.   Musculoskeletal: Negative.   Neurological: Negative.   Psychiatric/Behavioral: Negative.      Per HPI unless  specifically indicated above     Objective:    BP 119/77   Pulse 92   Temp 97.9 F (36.6 C)   Ht 5' 3.5" (1.613 m)   Wt 123 lb 3.2 oz (55.9 kg)   SpO2 95%   BMI 21.48 kg/m   Wt Readings from Last 3 Encounters:  05/31/22 123 lb 3.2 oz (55.9 kg)  05/19/22 110 lb (49.9 kg)  10/25/21 110 lb (49.9 kg)    Physical Exam Vitals and nursing note reviewed.  Constitutional:      General: She is not in acute distress.    Appearance: Normal appearance. She is normal weight. She is not ill-appearing, toxic-appearing or diaphoretic.  HENT:     Head: Normocephalic and atraumatic.     Right Ear: External ear normal.     Left Ear: External ear normal.     Nose: Nose normal.     Mouth/Throat:     Mouth: Mucous membranes are moist.     Pharynx: Oropharynx is clear.  Eyes:     General: No scleral icterus.       Right eye: No discharge.        Left eye: No discharge.     Extraocular Movements: Extraocular movements intact.     Conjunctiva/sclera: Conjunctivae normal.     Pupils: Pupils are equal, round, and reactive to light.  Cardiovascular:     Rate and Rhythm: Normal rate and regular rhythm.     Pulses: Normal pulses.     Heart sounds: Normal heart sounds. No murmur heard.    No friction rub. No gallop.  Pulmonary:     Effort: Pulmonary effort is normal. No respiratory distress.     Breath sounds: Normal breath  sounds. No stridor. No wheezing, rhonchi or rales.  Chest:     Chest wall: No tenderness.  Musculoskeletal:        General: Normal range of motion.     Cervical back: Normal range of motion and neck supple.  Skin:    General: Skin is warm and dry.     Capillary Refill: Capillary refill takes less than 2 seconds.     Coloration: Skin is not jaundiced or pale.     Findings: No bruising, erythema, lesion or rash.  Neurological:     General: No focal deficit present.     Mental Status: She is alert and oriented to person, place, and time. Mental status is at baseline.   Psychiatric:        Mood and Affect: Mood normal.        Behavior: Behavior normal.        Thought Content: Thought content normal.        Judgment: Judgment normal.     Results for orders placed or performed in visit on 05/31/22  Urinalysis, Routine w reflex microscopic  Result Value Ref Range   Specific Gravity, UA 1.020 1.005 - 1.030   pH, UA 6.0 5.0 - 7.5   Color, UA Yellow Yellow   Appearance Ur Clear Clear   Leukocytes,UA Negative Negative   Protein,UA Negative Negative/Trace   Glucose, UA Negative Negative   Ketones, UA Negative Negative   RBC, UA Negative Negative   Bilirubin, UA Negative Negative   Urobilinogen, Ur 1.0 0.2 - 1.0 mg/dL   Nitrite, UA Negative Negative  Microalbumin, Urine Waived  Result Value Ref Range   Microalb, Ur Waived 10 0 - 19 mg/L   Creatinine, Urine Waived 100 10 - 300 mg/dL   Microalb/Creat Ratio <30 <30 mg/g      Assessment & Plan:   Problem List Items Addressed This Visit       Cardiovascular and Mediastinum   Essential hypertension    Under good control on current regimen. Continue current regimen. Continue to monitor. Call with any concerns. Refills given. Labs drawn today.       Relevant Medications   atenolol (TENORMIN) 25 MG tablet   benazepril (LOTENSIN) 10 MG tablet   Other Relevant Orders   CBC with Differential/Platelet   Comprehensive metabolic panel   Urinalysis, Routine w reflex microscopic (Completed)   Microalbumin, Urine Waived (Completed)   Aortic atherosclerosis (HCC)    Will keep BP and cholesterol under good control. Continue to monitor. Call with any concerns.       Relevant Medications   atenolol (TENORMIN) 25 MG tablet   benazepril (LOTENSIN) 10 MG tablet   Other Relevant Orders   CBC with Differential/Platelet   Comprehensive metabolic panel   Senile purpura ([place])    Reassured patient. Continue to monitor.       Relevant Medications   atenolol (TENORMIN) 25 MG tablet   benazepril (LOTENSIN)  10 MG tablet   Other Relevant Orders   CBC with Differential/Platelet   Comprehensive metabolic panel   Coronary artery disease of native artery of native heart with stable angina pectoris ([place])    Continue to follow with cardiology. Will keep BP and cholesterol under good control. Call with any concerns.       Relevant Medications   atenolol (TENORMIN) 25 MG tablet   benazepril (LOTENSIN) 10 MG tablet   gabapentin (NEURONTIN) 300 MG capsule   Other Relevant Orders   CBC with Differential/Platelet  Comprehensive metabolic panel     Endocrine   Hypothyroidism - Primary    Has not been taking her medicine for 2+ months. Will check labs and treat as needed. Call with any concerns.       Relevant Medications   atenolol (TENORMIN) 25 MG tablet   Other Relevant Orders   CBC with Differential/Platelet   Comprehensive metabolic panel   TSH     Musculoskeletal and Integument   Idiopathic scoliosis and kyphoscoliosis   Relevant Medications   gabapentin (NEURONTIN) 300 MG capsule   Thoracic spondylosis without myelopathy   Relevant Medications   gabapentin (NEURONTIN) 300 MG capsule     Other   Chronic pain syndrome   Relevant Medications   gabapentin (NEURONTIN) 300 MG capsule   Dyslipidemia    Rechecking labs today. Await results. Treat as needed.       Relevant Orders   CBC with Differential/Platelet   Comprehensive metabolic panel   Lipid Panel w/o Chol/HDL Ratio   Oliguria   Relevant Orders   CBC with Differential/Platelet   Comprehensive metabolic panel   Hypomagnesemia    Rechecking labs today. Await results. Treat as needed.       Relevant Orders   Magnesium   Other Visit Diagnoses     Postmenopausal estrogen deficiency       DEXA ordered today. Await results.    Relevant Orders   DG Bone Density        Follow up plan: Return in about 6 weeks (around 07/12/2022).

## 2022-05-31 NOTE — Assessment & Plan Note (Signed)
Will keep BP and cholesterol under good control. Continue to monitor. Call with any concerns.  

## 2022-06-01 ENCOUNTER — Encounter (INDEPENDENT_AMBULATORY_CARE_PROVIDER_SITE_OTHER): Payer: Medicare Other

## 2022-06-01 ENCOUNTER — Ambulatory Visit (INDEPENDENT_AMBULATORY_CARE_PROVIDER_SITE_OTHER): Payer: Medicare Other | Admitting: Nurse Practitioner

## 2022-06-01 LAB — CBC WITH DIFFERENTIAL/PLATELET
Basophils Absolute: 0.1 10*3/uL (ref 0.0–0.2)
Basos: 1 %
EOS (ABSOLUTE): 0.2 10*3/uL (ref 0.0–0.4)
Eos: 2 %
Hematocrit: 39.6 % (ref 34.0–46.6)
Hemoglobin: 13 g/dL (ref 11.1–15.9)
Immature Grans (Abs): 0.1 10*3/uL (ref 0.0–0.1)
Immature Granulocytes: 1 %
Lymphocytes Absolute: 3.6 10*3/uL — ABNORMAL HIGH (ref 0.7–3.1)
Lymphs: 32 %
MCH: 31.1 pg (ref 26.6–33.0)
MCHC: 32.8 g/dL (ref 31.5–35.7)
MCV: 95 fL (ref 79–97)
Monocytes Absolute: 0.7 10*3/uL (ref 0.1–0.9)
Monocytes: 6 %
Neutrophils Absolute: 6.5 10*3/uL (ref 1.4–7.0)
Neutrophils: 58 %
Platelets: 469 10*3/uL — ABNORMAL HIGH (ref 150–450)
RBC: 4.18 x10E6/uL (ref 3.77–5.28)
RDW: 13.2 % (ref 11.7–15.4)
WBC: 11.1 10*3/uL — ABNORMAL HIGH (ref 3.4–10.8)

## 2022-06-01 LAB — TSH: TSH: 100 u[IU]/mL — ABNORMAL HIGH (ref 0.450–4.500)

## 2022-06-01 LAB — LIPID PANEL W/O CHOL/HDL RATIO
Cholesterol, Total: 198 mg/dL (ref 100–199)
HDL: 44 mg/dL (ref >39)
LDL Chol Calc (NIH): 108 mg/dL — ABNORMAL HIGH (ref 0–99)
Triglycerides: 269 mg/dL — ABNORMAL HIGH (ref 0–149)
VLDL Cholesterol Cal: 46 mg/dL — ABNORMAL HIGH (ref 5–40)

## 2022-06-01 LAB — MAGNESIUM: Magnesium: 1.6 mg/dL (ref 1.6–2.3)

## 2022-06-01 LAB — COMPREHENSIVE METABOLIC PANEL
ALT: 9 IU/L (ref 0–32)
AST: 16 IU/L (ref 0–40)
Albumin/Globulin Ratio: 2.1 (ref 1.2–2.2)
Albumin: 4.6 g/dL (ref 3.7–4.7)
Alkaline Phosphatase: 71 IU/L (ref 44–121)
BUN/Creatinine Ratio: 11 — ABNORMAL LOW (ref 12–28)
BUN: 10 mg/dL (ref 8–27)
Bilirubin Total: 0.2 mg/dL (ref 0.0–1.2)
CO2: 24 mmol/L (ref 20–29)
Calcium: 9.7 mg/dL (ref 8.7–10.3)
Chloride: 102 mmol/L (ref 96–106)
Creatinine, Ser: 0.94 mg/dL (ref 0.57–1.00)
Globulin, Total: 2.2 g/dL (ref 1.5–4.5)
Glucose: 83 mg/dL (ref 70–99)
Potassium: 3.9 mmol/L (ref 3.5–5.2)
Sodium: 141 mmol/L (ref 134–144)
Total Protein: 6.8 g/dL (ref 6.0–8.5)
eGFR: 61 mL/min/{1.73_m2} (ref >59)

## 2022-06-03 ENCOUNTER — Other Ambulatory Visit: Payer: Self-pay | Admitting: Family Medicine

## 2022-06-03 MED ORDER — THYROID 90 MG PO TABS: 90.0000 mg | ORAL_TABLET | ORAL | 1 refills | Status: DC

## 2022-06-08 ENCOUNTER — Telehealth: Payer: Self-pay | Admitting: Student in an Organized Health Care Education/Training Program

## 2022-06-08 NOTE — Telephone Encounter (Signed)
Needs appt. Juliann Pulse will call patient to schedule.

## 2022-06-08 NOTE — Telephone Encounter (Signed)
Patient called pharmacy to fill tramadol, was told the script was expired. She was here on 05-19-22 for med mgmt appt. She needs tramadol sent to her pharmacy please.

## 2022-06-09 NOTE — Telephone Encounter (Signed)
Scheduled patient for 06-14-22

## 2022-06-13 ENCOUNTER — Encounter (INDEPENDENT_AMBULATORY_CARE_PROVIDER_SITE_OTHER): Payer: Self-pay

## 2022-06-14 ENCOUNTER — Ambulatory Visit
Payer: Medicare Other | Attending: Student in an Organized Health Care Education/Training Program | Admitting: Student in an Organized Health Care Education/Training Program

## 2022-06-14 ENCOUNTER — Encounter: Payer: Self-pay | Admitting: Student in an Organized Health Care Education/Training Program

## 2022-06-14 VITALS — BP 137/66 | HR 93 | Temp 97.3°F | Resp 16 | Ht 63.0 in | Wt 120.0 lb

## 2022-06-14 DIAGNOSIS — M48062 Spinal stenosis, lumbar region with neurogenic claudication: Secondary | ICD-10-CM | POA: Diagnosis not present

## 2022-06-14 DIAGNOSIS — M5136 Other intervertebral disc degeneration, lumbar region: Secondary | ICD-10-CM | POA: Insufficient documentation

## 2022-06-14 DIAGNOSIS — Z23 Encounter for immunization: Secondary | ICD-10-CM | POA: Diagnosis not present

## 2022-06-14 DIAGNOSIS — M4120 Other idiopathic scoliosis, site unspecified: Secondary | ICD-10-CM | POA: Insufficient documentation

## 2022-06-14 DIAGNOSIS — M47814 Spondylosis without myelopathy or radiculopathy, thoracic region: Secondary | ICD-10-CM | POA: Insufficient documentation

## 2022-06-14 DIAGNOSIS — M47816 Spondylosis without myelopathy or radiculopathy, lumbar region: Secondary | ICD-10-CM | POA: Insufficient documentation

## 2022-06-14 DIAGNOSIS — G894 Chronic pain syndrome: Secondary | ICD-10-CM | POA: Diagnosis not present

## 2022-06-14 MED ORDER — TRAMADOL HCL 50 MG PO TABS: 50.0000 mg | ORAL_TABLET | Freq: Four times a day (QID) | ORAL | 2 refills | Status: AC | PRN

## 2022-06-14 NOTE — Progress Notes (Signed)
PROVIDER NOTE: Information contained herein reflects review and annotations entered in association with encounter. Interpretation of such information and data should be left to medically-trained personnel. Information provided to patient can be located elsewhere in the medical record under "Patient Instructions". Document created using STT-dictation technology, any transcriptional errors that may result from process are unintentional.    Patient: Savannah Mcclure  Service Category: E/M  Provider: Gillis Santa, MD  DOB: [birth year]  DOS: 06/14/2022  Specialty: Interventional Pain Management  MRN: [id number]  Setting: Ambulatory outpatient  PCP: [name], DO  Type: Established Patient    Referring Provider: [name], DO  Location: Office  Delivery: Face-to-face     HPI  [name], a 81 y.o. year old female, is here today because of her Chronic pain syndrome [G89.4]. [name]'s primary complain today is Back Pain Last encounter: My last encounter with her was on 11/23/21 Pertinent problems: [name] has Essential hypertension; COPD exacerbation ([place]); Idiopathic scoliosis and kyphoscoliosis; Spinal stenosis of lumbar region with neurogenic claudication; PAD (peripheral artery disease) ([place]); Chronic left shoulder pain; Chronic pain syndrome; Spondylosis without myelopathy or radiculopathy, lumbar region; and Thoracic spondylosis without myelopathy on their pertinent problem list. Pain Assessment: Severity of Chronic pain is reported as a 2 /10. Location: Back Lower/denies. Onset: More than a month ago. Quality: Aching, Dull, Sharp. Timing: Constant. Modifying factor(s): meds, rest. Vitals:  height is _0  (1.6 m) and weight is 120 lb (54.4 kg). Her temperature is 97.3 F (36.3 C) (abnormal). Her blood pressure is 137/66 and her pulse is 93. Her respiration is 16 and oxygen saturation is 100%.   Reason for encounter: medication management.    No  change in medical history since last visit.  Patient's pain is at baseline.  Patient continues  pain regimen as prescribed.  States that it provides pain relief and improvement in functional status.   Pharmacotherapy Assessment   03/30/2022  11/23/2021   1  Tramadol Hcl 50 Mg Tablet 120.00  30  Bi Lat  [id number]   Sou ([id number])  2/5  40.00 MME  Comm Ins  Foster      Analgesic: Tramadol 50 mg QID prn   Monitoring: [place] PMP: PDMP reviewed during this encounter.       Pharmacotherapy: No side-effects or adverse reactions reported. Compliance: No problems identified. Effectiveness: Clinically acceptable.  Dewayne Shorter, RN  06/14/2022 11:06 AM  Sign when Signing Visit Nursing Pain Medication Assessment:  Safety precautions to be maintained throughout the outpatient stay will include: orient to surroundings, keep bed in low position, maintain call bell within reach at all times, provide assistance with transfer out of bed and ambulation.  Medication Inspection Compliance: Pill count conducted under aseptic conditions, in front of the patient. Neither the pills nor the bottle was removed from the patient's sight at any time. Once count was completed pills were immediately returned to the patient in their original bottle.  Medication: Tramadol (Ultram) Pill/Patch Count:  2 of 120 pills remain Pill/Patch Appearance: Markings consistent with prescribed medication Bottle Appearance: Standard pharmacy container. Clearly labeled. Filled Date: 08 / 16 / 2023 Last Medication intake:  Today   UDS:  Summary  Date Value Ref Range Status  04/24/2019 Note  Final    Comment:    ==================================================================== ToxASSURE Select 13 (MW) ==================================================================== Test  Result       Flag       Units Drug Absent but Declared for Prescription Verification   Tramadol                       Not Detected UNEXPECTED  ng/mg creat ==================================================================== Test                      Result    Flag   Units      Ref Range   Creatinine              53               mg/dL      >=20 ==================================================================== Declared Medications:  The flagging and interpretation on this report are based on the  following declared medications.  Unexpected results may arise from  inaccuracies in the declared medications.  **Note: The testing scope of this panel includes these medications:  Tramadol  **Note: The testing scope of this panel does not include the  following reported medications:  Albuterol (Duoneb)  Benazepril  Cefuroxime  Cilostazol (Pletal)  Cyclobenzaprine  Gabapentin  Hydroxyzine  Ipratropium (Duoneb)  Magnesium  Montelukast  Multivitamin  Niacinamide  Omeprazole  Potassium  Supplement  Tiotropium  Topical Lidocaine  Vitamin B ==================================================================== For clinical consultation, please call [phone]. ====================================================================      ROS  Constitutional: Denies any fever or chills Gastrointestinal: No reported hemesis, hematochezia, vomiting, or acute GI distress Musculoskeletal:  +LBP Neurological: No reported episodes of acute onset apraxia, aphasia, dysarthria, agnosia, amnesia, paralysis, loss of coordination, or loss of consciousness  Medication Review  B Complex-C, Collagen-Vitamin C-Biotin, Fluticasone-Umeclidin-Vilant, Misc Natural Products, Niacinamide, acetaminophen, albuterol, atenolol, benazepril, cilostazol, cyclobenzaprine, diphenhydrAMINE, fluticasone, gabapentin, hydrOXYzine, lidocaine, montelukast, multivitamin with minerals, omeprazole, thyroid, and traMADol  History Review  Allergy: [name] is allergic to other, oxycodone, amlodipine, augmentin [amoxicillin-pot clavulanate], azithromycin,  contrast media [iodinated contrast media], doxycycline, erythromycin, hydrocodone, lactose intolerance (gi), minocycline, nsaids, relafen [nabumetone], sulfa antibiotics, synthroid [levothyroxine], tetracyclines & related, tilactase, and tramadol. Drug: [name]  reports no history of drug use. Alcohol:  reports current alcohol use. Tobacco:  reports that she has been smoking cigarettes. She has a 20.00 pack-year smoking history. She has never used smokeless tobacco. Social: [name]  reports that she has been smoking cigarettes. She has a 20.00 pack-year smoking history. She has never used smokeless tobacco. She reports current alcohol use. She reports that she does not use drugs. Medical:  has a past medical history of Abdominal distension (03/18/2012), Abnormal weight loss (10/30/2017), Acute kidney injury ([place]) (03/22/2012), Aneurysm ([place]), Anxiety, Blind right eye, Cardiac arrest ([place]), Cataract, Claudication ([place]), Complication of anesthesia (03/15/2012), COPD (chronic obstructive pulmonary disease) ([place]), Delirium (03/18/2012), Dyspnea, GERD (gastroesophageal reflux disease), Heart murmur, Hypertension, Leaky heart valve, Oxygen deficit, Peripheral vascular disease ([place]), Scoliosis, Sepsis ([place]), Shock ([place]) (03/23/2012), SIRS (systemic inflammatory response syndrome) ([place]) (03/23/2012), Spinal stenosis, Stroke ([place]), and Tachycardia. Surgical: [name]  has a past surgical history that includes right eye surgery; Wisdom tooth extraction; Back surgery; Total abdominal hysterectomy; Breast lumpectomy (Left); Lower Extremity Angiography (Right, 06/20/2018); Cataract extraction w/PHACO (Left, 08/14/2019); and Lower Extremity Angiography (Left, 02/03/2020). Family: family history includes Heart disease in her paternal grandfather and paternal grandmother; Heart failure in her father; Hypertension in her mother; Intracerebral hemorrhage in her mother; Kidney disease in her maternal  grandmother; Rheum arthritis in her maternal grandmother; Schizophrenia in her daughter;  Stroke in her paternal grandfather and paternal grandmother.  Laboratory Chemistry Profile   Renal Lab Results  Component Value Date   BUN 10 05/31/2022   CREATININE 0.94 05/31/2022   BCR 11 (L) 05/31/2022   GFRAA 90 09/10/2020   GFRNONAA >60 07/12/2021     Hepatic Lab Results  Component Value Date   AST 16 05/31/2022   ALT 9 05/31/2022   ALBUMIN 4.6 05/31/2022   ALKPHOS 71 05/31/2022     Electrolytes Lab Results  Component Value Date   NA 141 05/31/2022   K 3.9 05/31/2022   CL 102 05/31/2022   CALCIUM 9.7 05/31/2022   MG 1.6 05/31/2022   PHOS 3.2 10/13/2017     Bone Lab Results  Component Value Date   VD25OH 12.5 (L) 10/19/2021     Inflammation (CRP: Acute Phase) (ESR: Chronic Phase) Lab Results  Component Value Date   LATICACIDVEN 1.6 07/10/2021       Note: Above Lab results reviewed.  Recent Imaging Review  VAS [place] ABI WITH/WO TBI  LOWER EXTREMITY DOPPLER STUDY  Patient Name:  Savannah Mcclure  Date of Exam:   10/25/2021 Medical Rec #: [id number]                 Accession #:    [id number] Date of Birth: 09-17-1940                 Patient Gender: F Patient Age:   26 years Exam Location:  [facility] Procedure:      VAS [place] ABI WITH/WO TBI Referring Phys: Leotis Pain  --------------------------------------------------------------------------------   Indications: Peripheral artery disease, and Aorta occlusion              Bilat CIA, EIA, SFA occlusions with bilat PFA's providing              collateral flow.   Vascular Interventions: 02/03/2020 left PTA stent of EIA/CIA                         Patient states a history of right leg angioplasty.  Performing Technologist: Concha Norway RVT    Examination Guidelines: A complete evaluation includes at minimum, Doppler waveform signals and systolic blood pressure reading at the level of  bilateral brachial, anterior tibial, and posterior tibial arteries, when vessel segments are accessible. Bilateral testing is considered an integral part of a complete examination. Photoelectric Plethysmograph (PPG) waveforms and toe systolic pressure readings are included as required and additional duplex testing as needed. Limited examinations for reoccurring indications may be performed as noted.    ABI Findings: +---------+------------------+-----+---------+--------+ Right    Rt Pressure (mmHg)IndexWaveform Comment  +---------+------------------+-----+---------+--------+ Brachial 124                                      +---------+------------------+-----+---------+--------+ ATA      116               0.94 biphasic          +---------+------------------+-----+---------+--------+ PTA      104               0.84 triphasic         +---------+------------------+-----+---------+--------+ Great Toe104               0.84 Normal            +---------+------------------+-----+---------+--------+  +---------+------------------+-----+---------+-------+  Left     Lt Pressure (mmHg)IndexWaveform Comment +---------+------------------+-----+---------+-------+ Brachial 120                                     +---------+------------------+-----+---------+-------+ ATA      114               0.92 biphasic         +---------+------------------+-----+---------+-------+ PTA      103               0.83 triphasic        +---------+------------------+-----+---------+-------+ Great Toe70                0.56 Abnormal         +---------+------------------+-----+---------+-------+  +-------+-----------+-----------+------------+------------+ ABI/TBIToday's ABIToday's TBIPrevious ABIPrevious TBI +-------+-----------+-----------+------------+------------+ Right  .94        .84        .87         .82           +-------+-----------+-----------+------------+------------+ Left   .92        .56        .82         .47          +-------+-----------+-----------+------------+------------+    Bilateral ABIs appear increased compared to prior study on 04/27/2021.   Summary: Right: Resting right ankle-brachial index indicates mild right lower extremity arterial disease. The right toe-brachial index is normal.  Left: Resting left ankle-brachial index indicates mild left lower extremity arterial disease. The left toe-brachial index is abnormal.    *See table(s) above for measurements and observations.    Electronically signed by Leotis Pain MD on 11/01/2021 at 3:01:03 PM.       Final   VAS US CAROTID Carotid Arterial Duplex Study  Patient Name:  Savannah Mcclure  Date of Exam:   10/25/2021 Medical Rec #: [id number]                 Accession #:    [id number] Date of Birth: Sep 03, 1940                 Patient Gender: F Patient Age:   13 years Exam Location:  [facility] Procedure:      VAS US CAROTID Referring Phys: Leotis Pain  --------------------------------------------------------------------------------   Indications:       Carotid artery disease. Comparison Study:  04/21/2021  Performing Technologist: Concha Norway RVT    Examination Guidelines: A complete evaluation includes B-mode imaging, spectral Doppler, color Doppler, and power Doppler as needed of all accessible portions of each vessel. Bilateral testing is considered an integral part of a complete examination. Limited examinations for reoccurring indications may be performed as noted.    Right Carotid Findings: +----------+--------+--------+--------+----------------------+--------+           PSV cm/sEDV cm/sStenosisPlaque Description    Comments +----------+--------+--------+--------+----------------------+--------+ CCA Prox  65      14                                              +----------+--------+--------+--------+----------------------+--------+ CCA Mid   73      14                                             +----------+--------+--------+--------+----------------------+--------+  CCA Distal87      16                                             +----------+--------+--------+--------+----------------------+--------+ ICA Prox  112     25      1-39%   calcific and irregular         +----------+--------+--------+--------+----------------------+--------+ ICA Mid   99      19                                             +----------+--------+--------+--------+----------------------+--------+ ICA Distal84      15                                             +----------+--------+--------+--------+----------------------+--------+ ECA       199     8       >50%                                   +----------+--------+--------+--------+----------------------+--------+  +----------+--------+-------+---------+-------------------+           PSV cm/sEDV cmsDescribe Arm Pressure (mmHG) +----------+--------+-------+---------+-------------------+ [id number]            Turbulent                    +----------+--------+-------+---------+-------------------+  +---------+--------+--+--------+--+---------+ VertebralPSV cm/s65EDV cm/s13Antegrade +---------+--------+--+--------+--+---------+     Left Carotid Findings: +----------+--------+--------+--------+----------------------+--------+           PSV cm/sEDV cm/sStenosisPlaque Description    Comments +----------+--------+--------+--------+----------------------+--------+ CCA Prox  71      12                                             +----------+--------+--------+--------+----------------------+--------+ CCA Mid   82      17                                             +----------+--------+--------+--------+----------------------+--------+ CCA  Distal69      14                                             +----------+--------+--------+--------+----------------------+--------+ ICA Prox  204     56      40-59%  calcific and irregular         +----------+--------+--------+--------+----------------------+--------+ ICA Mid   194     38                                             +----------+--------+--------+--------+----------------------+--------+ ICA Distal116     36                                             +----------+--------+--------+--------+----------------------+--------+  ECA       183     5       >50%                                   +----------+--------+--------+--------+----------------------+--------+  +----------+--------+--------+----------------+-------------------+           PSV cm/sEDV cm/sDescribe        Arm Pressure (mmHG) +----------+--------+--------+----------------+-------------------+ [id number]     0       Multiphasic, WNL                    +----------+--------+--------+----------------+-------------------+  +---------+--------+--+--------+-+---------+ VertebralPSV cm/s15EDV cm/s0Antegrade +---------+--------+--+--------+-+---------+        Summary: Right Carotid: Velocities in the right ICA are consistent with a 1-39% stenosis.                Non-hemodynamically significant plaque <50% noted in the CCA. The                ECA appears >50% stenosed.  Left Carotid: Velocities in the left ICA are consistent with a 40-59% stenosis.               Non-hemodynamically significant plaque <50% noted in the CCA. The               ECA appears >50% stenosed.  Vertebrals:  Bilateral vertebral arteries demonstrate antegrade flow. Subclavians: Right subclavian artery flow was disturbed. Normal flow              hemodynamics were seen in the left subclavian artery.  *See table(s) above for measurements and observations.    Electronically signed by  Leotis Pain MD on 11/01/2021 at 3:00:45 PM.      Final   Note: Reviewed        Physical Exam  General appearance: Well nourished, well developed, and well hydrated. In no apparent acute distress Mental status: Alert, oriented x 3 (person, place, & time)       Respiratory: No evidence of acute respiratory distress Eyes: PERLA Vitals: BP 137/66   Pulse 93   Temp (!) 97.3 F (36.3 C)   Resp 16   Ht _0  (1.6 m)   Wt 120 lb (54.4 kg)   SpO2 100%   BMI 21.26 kg/m  BMI: Estimated body mass index is 21.26 kg/m as calculated from the following:   Height as of this encounter: _1  (1.6 m).   Weight as of this encounter: 120 lb (54.4 kg). Ideal: Ideal body weight: 52.4 kg (115 lb 8.3 oz) Adjusted ideal body weight: 53.2 kg (117 lb 5 oz)  Low back pain, worse with facet loading Significant scoliosis 5 out of 5 strength bilateral lower extremity: Plantar flexion, dorsiflexion, knee flexion, knee extension.   Assessment   Status Diagnosis  Controlled Controlled Controlled 1. Chronic pain syndrome   2. Idiopathic scoliosis and kyphoscoliosis   3. Thoracic spondylosis without myelopathy   4. Spondylosis without myelopathy or radiculopathy, lumbar region   5. Lumbar degenerative disc disease   6. Spinal stenosis of lumbar region with neurogenic claudication        Plan of Care   [name] has a current medication list which includes the following long-term medication(s): albuterol, atenolol, benazepril, gabapentin, montelukast, omeprazole, and thyroid.  Pharmacotherapy (Medications Ordered): Meds ordered this encounter  Medications   traMADol (ULTRAM) 50 MG tablet    Sig: Take 1 tablet (  50 mg total) by mouth every 6 (six) hours as needed for severe pain. Must last 30 days    Dispense:  120 tablet    Refill:  2    Chronic Pain: STOP Act (Not applicable) Fill 1 day early if closed on refill date. Avoid benzodiazepines within 8 hours of opioids   Orders:   Orders Placed This Encounter  Procedures   ToxASSURE Select 13 (MW), Urine    Volume: 30 ml(s). Minimum 3 ml of urine is needed. Document temperature of fresh sample. Indications: Long term (current) use of opiate analgesic (O71.219)    Order Specific Question:   Release to patient    Answer:   Immediate   Follow-up plan:   Return for patient will call to schedule F2F appt prn.     Status post T11, T12, L1 RFA bilaterally on 03/04/2019, 04/2018       Recent Visits No visits were found meeting these conditions. Showing recent visits within past 90 days and meeting all other requirements Today's Visits Date Type Provider Dept  06/14/22 Office Visit Gillis Santa, MD [facility]  Showing today's visits and meeting all other requirements Future Appointments No visits were found meeting these conditions. Showing future appointments within next 90 days and meeting all other requirements  I discussed the assessment and treatment plan with the patient. The patient was provided an opportunity to ask questions and all were answered. The patient agreed with the plan and demonstrated an understanding of the instructions.  Patient advised to call back or seek an in-person evaluation if the symptoms or condition worsens.  Duration of encounter: 30 minutes.  Note by: Gillis Santa, MD Date: 06/14/2022; Time: 11:56 AM

## 2022-06-14 NOTE — Progress Notes (Signed)
Nursing Pain Medication Assessment:  Safety precautions to be maintained throughout the outpatient stay will include: orient to surroundings, keep bed in low position, maintain call bell within reach at all times, provide assistance with transfer out of bed and ambulation.  Medication Inspection Compliance: Pill count conducted under aseptic conditions, in front of the patient. Neither the pills nor the bottle was removed from the patient's sight at any time. Once count was completed pills were immediately returned to the patient in their original bottle.  Medication: Tramadol (Ultram) Pill/Patch Count:  2 of 120 pills remain Pill/Patch Appearance: Markings consistent with prescribed medication Bottle Appearance: Standard pharmacy container. Clearly labeled. Filled Date: 08 / 16 / 2023 Last Medication intake:  Today

## 2022-06-16 NOTE — Telephone Encounter (Signed)
Close encounter 

## 2022-06-18 LAB — TOXASSURE SELECT 13 (MW), URINE

## 2022-07-12 ENCOUNTER — Ambulatory Visit: Payer: Medicare Other | Admitting: Family Medicine

## 2022-07-20 ENCOUNTER — Ambulatory Visit (INDEPENDENT_AMBULATORY_CARE_PROVIDER_SITE_OTHER): Payer: Medicare Other | Admitting: Nurse Practitioner

## 2022-07-20 ENCOUNTER — Encounter (INDEPENDENT_AMBULATORY_CARE_PROVIDER_SITE_OTHER): Payer: Self-pay | Admitting: Nurse Practitioner

## 2022-07-20 ENCOUNTER — Ambulatory Visit (INDEPENDENT_AMBULATORY_CARE_PROVIDER_SITE_OTHER): Payer: Medicare Other

## 2022-07-20 VITALS — BP 134/77 | HR 90 | Resp 15 | Wt 121.0 lb

## 2022-07-20 DIAGNOSIS — I6523 Occlusion and stenosis of bilateral carotid arteries: Secondary | ICD-10-CM

## 2022-07-20 DIAGNOSIS — I10 Essential (primary) hypertension: Secondary | ICD-10-CM

## 2022-07-20 DIAGNOSIS — I70213 Atherosclerosis of native arteries of extremities with intermittent claudication, bilateral legs: Secondary | ICD-10-CM

## 2022-07-21 ENCOUNTER — Ambulatory Visit (INDEPENDENT_AMBULATORY_CARE_PROVIDER_SITE_OTHER): Payer: TRICARE For Life (TFL) | Admitting: Family Medicine

## 2022-07-21 ENCOUNTER — Encounter: Payer: Self-pay | Admitting: Family Medicine

## 2022-07-21 VITALS — BP 120/75 | HR 87 | Temp 98.3°F | Ht 63.0 in | Wt 121.1 lb

## 2022-07-21 DIAGNOSIS — E039 Hypothyroidism, unspecified: Secondary | ICD-10-CM

## 2022-07-21 DIAGNOSIS — R6884 Jaw pain: Secondary | ICD-10-CM

## 2022-07-21 NOTE — Progress Notes (Signed)
BP 120/75   Pulse 87   Temp 98.3 F (36.8 C) (Oral)   Ht 5' 3" (1.6 m)   Wt 121 lb 1.6 oz (54.9 kg)   SpO2 93%   BMI 21.45 kg/m    Subjective:    Patient ID: Savannah Mcclure, female    DOB: 04-10-1941, 81 y.o.   MRN: [id number]  HPI: Savannah A Ripley is a 81 y.o. female  Chief Complaint  Patient presents with   Hypothyroidism   HYPOTHYROIDISM Thyroid control status:better Satisfied with current treatment? no Medication side effects: no Medication compliance: good compliance Recent dose adjustment:yes Fatigue: no Cold intolerance: no Heat intolerance: no Weight gain: no Weight loss: no Constipation: no Diarrhea/loose stools: no Palpitations: no Lower extremity edema: no Anxiety/depressed mood: no  Has been having jaw pain on and off for a few months. She has seen dentist and they didn't think it was her teeth. ?TMJ or arthritis. Mentioned it to her vascular doctor yesterday and they are getting a CT. She notes that it's aching and sore, worse when she's eating. No other concerns or complaints at this time.   Relevant past medical, surgical, family and social history reviewed and updated as indicated. Interim medical history since our last visit reviewed. Allergies and medications reviewed and updated.  Review of Systems  Constitutional: Negative.   Respiratory: Negative.    Cardiovascular: Negative.   Gastrointestinal: Negative.   Musculoskeletal: Negative.        R sided jaw pain  Neurological: Negative.   Psychiatric/Behavioral: Negative.      Per HPI unless specifically indicated above     Objective:    BP 120/75   Pulse 87   Temp 98.3 F (36.8 C) (Oral)   Ht 5' 3" (1.6 m)   Wt 121 lb 1.6 oz (54.9 kg)   SpO2 93%   BMI 21.45 kg/m   Wt Readings from Last 3 Encounters:  07/21/22 121 lb 1.6 oz (54.9 kg)  07/20/22 121 lb (54.9 kg)  06/14/22 120 lb (54.4 kg)    Physical Exam Vitals and nursing note reviewed.  Constitutional:       General: She is not in acute distress.    Appearance: Normal appearance. She is normal weight. She is not ill-appearing, toxic-appearing or diaphoretic.  HENT:     Head: Normocephalic and atraumatic.     Right Ear: External ear normal.     Left Ear: External ear normal.     Nose: Nose normal.     Mouth/Throat:     Mouth: Mucous membranes are moist.     Pharynx: Oropharynx is clear.  Eyes:     General: No scleral icterus.       Right eye: No discharge.        Left eye: No discharge.     Extraocular Movements: Extraocular movements intact.     Conjunctiva/sclera: Conjunctivae normal.     Pupils: Pupils are equal, round, and reactive to light.  Cardiovascular:     Rate and Rhythm: Normal rate and regular rhythm.     Pulses: Normal pulses.     Heart sounds: Normal heart sounds. No murmur heard.    No friction rub. No gallop.  Pulmonary:     Effort: Pulmonary effort is normal. No respiratory distress.     Breath sounds: Normal breath sounds. No stridor. No wheezing, rhonchi or rales.  Chest:     Chest wall: No tenderness.  Musculoskeletal:  General: Normal range of motion.     Cervical back: Normal range of motion and neck supple.  Skin:    General: Skin is warm and dry.     Capillary Refill: Capillary refill takes less than 2 seconds.     Coloration: Skin is not jaundiced or pale.     Findings: No bruising, erythema, lesion or rash.  Neurological:     General: No focal deficit present.     Mental Status: She is alert and oriented to person, place, and time. Mental status is at baseline.  Psychiatric:        Mood and Affect: Mood normal.        Behavior: Behavior normal.        Thought Content: Thought content normal.        Judgment: Judgment normal.     Results for orders placed or performed in visit on 06/14/22  ToxASSURE Select 13 (MW), Urine  Result Value Ref Range   Summary Note       Assessment & Plan:   Problem List Items Addressed This Visit        Endocrine   Hypothyroidism - Primary    Very undertreated last time, had not been taking medicine. Continue to monitor. Call with any concerns. Rechecking labs today. Await results.      Relevant Orders   TSH   Other Visit Diagnoses     Jaw pain       Saw vascular yesterday and they are doing a CT scan- we will check ESR to r/o TA. Call with any concerns.   Relevant Orders   Sed Rate (ESR)        Follow up plan: No follow-ups on file.

## 2022-07-21 NOTE — Assessment & Plan Note (Signed)
Very undertreated last time, had not been taking medicine. Continue to monitor. Call with any concerns. Rechecking labs today. Await results.

## 2022-07-22 ENCOUNTER — Other Ambulatory Visit: Payer: Self-pay | Admitting: Family Medicine

## 2022-07-22 LAB — TSH: TSH: 3.88 u[IU]/mL (ref 0.450–4.500)

## 2022-07-22 LAB — SEDIMENTATION RATE: Sed Rate: 25 mm/hr (ref 0–40)

## 2022-07-22 MED ORDER — THYROID 90 MG PO TABS: 90.0000 mg | ORAL_TABLET | ORAL | 3 refills | Status: DC

## 2022-07-31 ENCOUNTER — Encounter (INDEPENDENT_AMBULATORY_CARE_PROVIDER_SITE_OTHER): Payer: Self-pay | Admitting: Nurse Practitioner

## 2022-07-31 NOTE — Progress Notes (Incomplete)
Subjective:    Patient ID: Savannah Mcclure, female    DOB: 11/10/1940, 81 y.o.   MRN: [id number] Chief Complaint  Patient presents with  . Follow-up    Ultrasound follow up    The patient is seen for follow up evaluation of carotid stenosis. The carotid stenosis followed by ultrasound.   The patient has recently began to have symptoms that resemble possible amaurosis fugax.  She notes that she has symptoms to wear the vision narrows on the sides of her periphery.  These symptoms have been increasing.  Denies any other symptoms such as unilateral weakness or paralysis. The patient is taking enteric-coated aspirin 81 mg daily.  There is no history of migraine headaches. There is no history of seizures.  No recent shortening of the patient's walking distance or new symptoms consistent with claudication.  No history of rest pain symptoms. No new ulcers or wounds of the lower extremities have occurred.  There is no history of DVT, PE or superficial thrombophlebitis. No recent episodes of angina or shortness of breath documented.   Carotid Duplex done today shows ***.  No change compared to last study in ***    Review of Systems     Objective:   Physical Exam  BP 134/77 (BP Location: Right Arm)   Pulse 90   Resp 15   Wt 121 lb (54.9 kg)   BMI 21.43 kg/m   Past Medical History:  Diagnosis Date  . Abdominal distension 03/18/2012  . Abnormal weight loss 10/30/2017  . Acute kidney injury ([place]) 03/22/2012  . 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 Anxiety   . Blind right eye   . Cardiac arrest ([place])   . Cataract    right eye  . Claudication ([place])   . Complication of anesthesia 03/15/2012   "crashed" during first part of 3-part back surgery.   Marland Kitchen COPD (chronic obstructive pulmonary disease) ([place])   . Delirium 03/18/2012  . Dyspnea   . GERD (gastroesophageal reflux disease)   . Heart murmur   . Hypertension   . Leaky heart valve   . Oxygen deficit    uses O2 at night 2  liters  . Peripheral vascular disease ([place])   . Scoliosis   . Sepsis ([place])   . Shock ([place]) 03/23/2012  . SIRS (systemic inflammatory response syndrome) ([place]) 03/23/2012  . Spinal stenosis   . Stroke [facility])    Eye  . Tachycardia     Social History   Socioeconomic History  . Marital status: Widowed    Spouse name: Not on file  . Number of children: 2  . Years of education: college   . Highest education level: Not on file  Occupational History  . Occupation: retired  Tobacco Use  . Smoking status: Every Day    Packs/day: 0.50    Years: 40.00    Total pack years: 20.00    Types: Cigarettes  . Smokeless tobacco: Never  . Tobacco comments:    0.5PPD 08/31/2021  Vaping Use  . Vaping Use: Former  Substance and Sexual Activity  . Alcohol use: Yes    Comment: wine on occasion (1x/month)  . Drug use: No  . Sexual activity: Not Currently  Other Topics Concern  . Not on file  Social History Narrative   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 And grandkids (68 & 64 y.o.)   Social Determinants of Health   Financial Resource Strain: Medium Risk (09/29/2021)   Overall Financial Resource  Strain (CARDIA)   . Difficulty of Paying Living Expenses: Somewhat hard  Food Insecurity: Food Insecurity Present (09/29/2021)   Hunger Vital Sign   . Worried About Charity fundraiser in the Last Year: Sometimes true   . Ran Out of Food in the Last Year: Sometimes true  Transportation Needs: No Transportation Needs (09/29/2021)   PRAPARE - Transportation   . Lack of Transportation (Medical): No   . Lack of Transportation (Non-Medical): No  Physical Activity: Inactive (09/29/2021)   Exercise Vital Sign   . Days of Exercise per Week: 0 days   . Minutes of Exercise per Session: 0 min  Stress: Stress Concern Present (09/29/2021)   [place]   . Feeling of Stress : To some extent  Social Connections: Socially Isolated (09/29/2021)    Social Connection and Isolation Panel [NHANES]   . Frequency of Communication with Friends and Family: More than three times a week   . Frequency of Social Gatherings with Friends and Family: Three times a week   . Attends Religious Services: Never   . Active Member of Clubs or Organizations: No   . Attends Archivist Meetings: Never   . Marital Status: Widowed  Intimate Partner Violence: Not At Risk (09/29/2021)   Humiliation, Afraid, Rape, and Kick questionnaire   . Fear of Current or Ex-Partner: No   . Emotionally Abused: No   . Physically Abused: No   . Sexually Abused: No    Past Surgical History:  Procedure Laterality Date  . BACK SURGERY    . BREAST LUMPECTOMY Left   . CATARACT EXTRACTION W/PHACO Left 08/14/2019   Procedure: CATARACT EXTRACTION PHACO AND INTRAOCULAR LENS PLACEMENT (IOC) LEFT 10.22 00:59.7 42.4%;  Surgeon: Leandrew Koyanagi, MD;  Location: [place];  Service: Ophthalmology;  Laterality: Left;  leave it last patient per [place]  . LOWER EXTREMITY ANGIOGRAPHY Right 06/20/2018   Procedure: LOWER EXTREMITY ANGIOGRAPHY;  Surgeon: Algernon Huxley, MD;  Location: [place] [facility];  Service: Cardiovascular;  Laterality: Right;  . LOWER EXTREMITY ANGIOGRAPHY Left 02/03/2020   Procedure: LOWER EXTREMITY ANGIOGRAPHY;  Surgeon: Algernon Huxley, MD;  Location: [place] [facility];  Service: Cardiovascular;  Laterality: Left;  . 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 TOTAL ABDOMINAL HYSTERECTOMY     total  . WISDOM TOOTH EXTRACTION      Family History  Problem Relation Age of Onset  . Intracerebral hemorrhage Mother   . Hypertension Mother   . Heart failure Father   . Schizophrenia Daughter   . Rheum arthritis Maternal Grandmother   . Kidney disease Maternal Grandmother   . Heart disease Paternal Grandmother   . Stroke Paternal Grandmother   . Stroke Paternal Grandfather   . Heart disease Paternal Grandfather      Allergies  Allergen Reactions  . Other Itching    Cat Gut sutures   . Oxycodone Hives  . Amlodipine Hives  . Augmentin [Amoxicillin-Pot Clavulanate] Diarrhea  . Azithromycin Hives  . Contrast Media [Iodinated Contrast Media] Hives  . Doxycycline Diarrhea  . Erythromycin Diarrhea  . Hydrocodone Nausea Only  . Lactose Intolerance (Gi) Diarrhea    Bloating  . Minocycline Diarrhea  . Nsaids Other (See Comments)    Internal Bleeding  . Relafen [Nabumetone] Hives  . Sulfa Antibiotics Hives  . Synthroid [Levothyroxine] Diarrhea  . Tetracyclines & Related Diarrhea  . Tilactase   .  Tramadol Rash    Takes +/- Benadryl       Latest Ref Rng & Units 05/31/2022    3:17 PM 10/19/2021    1:29 PM 07/20/2021    3:29 PM  CBC  WBC 3.4 - 10.8 x10E3/uL 11.1  10.7  19.4   Hemoglobin 11.1 - 15.9 g/dL 13.0  13.2  14.7   Hematocrit 34.0 - 46.6 % 39.6  39.6  42.8   Platelets 150 - 450 x10E3/uL 469  444  704       CMP     Component Value Date/Time   NA 141 05/31/2022 1517   NA 132 (L) 09/12/2012 1540   K 3.9 05/31/2022 1517   K 3.8 09/11/2012 0401   CL 102 05/31/2022 1517   CL 99 09/11/2012 0401   CO2 24 05/31/2022 1517   CO2 24 09/11/2012 0401   GLUCOSE 83 05/31/2022 1517   GLUCOSE 137 (H) 07/12/2021 0533   GLUCOSE 262 (H) 09/11/2012 0401   BUN 10 05/31/2022 1517   BUN 22 (H) 09/11/2012 0401   CREATININE 0.94 05/31/2022 1517   CREATININE 0.86 09/11/2012 0401   CALCIUM 9.7 05/31/2022 1517   CALCIUM 8.5 09/11/2012 0401   PROT 6.8 05/31/2022 1517   ALBUMIN 4.6 05/31/2022 1517   AST 16 05/31/2022 1517   ALT 9 05/31/2022 1517   ALKPHOS 71 05/31/2022 1517   BILITOT 0.2 05/31/2022 1517   GFRNONAA >60 07/12/2021 0533   GFRNONAA >60 09/11/2012 0401   GFRAA 90 09/10/2020 1409   GFRAA >60 09/11/2012 0401     No results found.     Assessment & Plan:   1. Bilateral carotid artery stenosis The patient is beginning to exhibit symptoms that may suggest that she has become  symptomatic.  The symptoms that she describes are concerning for possible amaurosis fugax.  Given her remote history of a 60 to 79% stenosis I suspect that her stenosis may be greater than estimated as sometimes more calcified plaque can be underestimated on ultrasound.  Patient should undergo CT angiography of the carotid arteries to define the degree of stenosis of the internal carotid arteries bilaterally and the anatomic suitability for surgery vs. intervention.  If the patient does indeed need surgery cardiac clearance will be required, once cleared the patient will be scheduled for surgery.  The risks, benefits and alternative therapies were reviewed in detail with the patient.  All questions were answered.  The patient agrees to proceed with imaging.  Continue antiplatelet therapy as prescribed. Continue management of CAD, HTN and Hyperlipidemia. Healthy heart diet, encouraged exercise at least 4 times per week.  - CT ANGIO NECK W OR WO CONTRAST; Future  2. Atherosclerosis of native artery of both lower extremities with intermittent claudication (HCC)  Recommend:  The patient has evidence of atherosclerosis of the lower extremities with claudication.  The patient does not voice lifestyle limiting changes at this point in time.  Noninvasive studies do not suggest clinically significant change.  No invasive studies, angiography or surgery at this time The patient should continue walking and begin a more formal exercise program.  The patient should continue antiplatelet therapy and aggressive treatment of the lipid abnormalities  No changes in the patient's medications at this time  Continued surveillance is indicated as atherosclerosis is likely to progress with time.    The patient will continue follow up with noninvasive studies as ordered.   3. Primary hypertension Continue antihypertensive medications as already ordered, these medications have been reviewed  and there are no  changes at this time.   Current Outpatient Medications on File Prior to Visit  Medication Sig Dispense Refill  . acetaminophen (TYLENOL) 500 MG tablet Take 500 mg by mouth every 6 (six) hours as needed.    Marland Kitchen albuterol (VENTOLIN HFA) 108 (90 Base) MCG/ACT inhaler Inhale 2 puffs into the lungs every 6 (six) hours as needed for wheezing or shortness of breath. 8 g 6  . atenolol (TENORMIN) 25 MG tablet Take 1 tablet (25 mg total) by mouth daily. 90 tablet 1  . B-COMPLEX-C PO Take 1 tablet by mouth daily.    . benazepril (LOTENSIN) 10 MG tablet Take 1 tablet (10 mg total) by mouth daily. 90 tablet 1  . cilostazol (PLETAL) 100 MG tablet Take 1 tablet (100 mg total) by mouth 2 (two) times daily. 180 tablet 1  . Collagen-Vitamin C-Biotin (COLLAGEN 1500/C) 500-50-0.8 MG CAPS Take by mouth.    . cyclobenzaprine (FLEXERIL) 5 MG tablet Take 1 tablet (5 mg total) by mouth 3 (three) times daily as needed for muscle spasms. 30 tablet 6  . diphenhydrAMINE (BENADRYL) 25 MG tablet Take 25 mg by mouth every 6 (six) hours as needed.     . fluticasone (FLONASE) 50 MCG/ACT nasal spray 2 sprays into each nostril in the morning. Up to three sprays.    Marland Kitchen gabapentin (NEURONTIN) 300 MG capsule Take 1 capsule (300 mg total) by mouth 2 (two) times daily. 180 capsule 6  . hydrOXYzine (ATARAX) 25 MG tablet Take 1 tablet (25 mg total) by mouth 3 (three) times daily as needed. 270 tablet 3  . lidocaine (LIDODERM) 5 % Place 1 patch onto the skin daily. Remove & Discard patch within 12 hours or as directed by MD 60 patch 6  . Misc Natural Products (JOINT HEALTH PO) Take by mouth daily.    . montelukast (SINGULAIR) 10 MG tablet Take 1 tablet (10 mg total) by mouth at bedtime. 90 tablet 3  . Multiple Vitamins-Minerals (MULTIVITAMIN WITH MINERALS) tablet Take 1 tablet by mouth daily.    Marland Kitchen NIACINAMIDE PO Take 1 capsule by mouth daily.    Marland Kitchen omeprazole (PRILOSEC) 20 MG capsule Take 1 capsule (20 mg total) by mouth daily. 90 capsule 3   . traMADol (ULTRAM) 50 MG tablet Take 1 tablet (50 mg total) by mouth every 6 (six) hours as needed for severe pain. Must last 30 days 120 tablet 2  . TRELEGY ELLIPTA 100-62.5-25 MCG/ACT AEPB Inhale 1 puff into the lungs daily for 1 day. 60 each 5   Current Facility-Administered Medications on File Prior to Visit  Medication Dose Route Frequency Provider Last Rate Last Admin  . albuterol (PROVENTIL) (2.5 MG/3ML) 0.083% nebulizer solution 2.5 mg  2.5 mg Nebulization Once Johnson, Megan P, DO        There are no Patient Instructions on file for this visit. No follow-ups on file.   Kris Hartmann, NP

## 2022-07-31 NOTE — Progress Notes (Signed)
Subjective:    Patient ID: Savannah Mcclure, female    DOB: [birth year], 81 y.o.   MRN: [id number] Chief Complaint  Patient presents with   Follow-up    Ultrasound follow up    The patient is seen for follow up evaluation of carotid stenosis. The carotid stenosis followed by ultrasound.   The patient has recently began to have symptoms that resemble possible amaurosis fugax.  She notes that she has symptoms to wear the vision narrows on the sides of her periphery.  These symptoms have been increasing.  Denies any other symptoms such as unilateral weakness or paralysis. The patient is taking enteric-coated aspirin 81 mg daily.  There is no history of migraine headaches. There is no history of seizures.  No recent shortening of the patient's walking distance or new symptoms consistent with claudication.  No history of rest pain symptoms. No new ulcers or wounds of the lower extremities have occurred.  There is no history of DVT, PE or superficial thrombophlebitis. No recent episodes of angina or shortness of breath documented.   Carotid Duplex done today shows 1 to 39% stenosis of the right ICA with 40 to 59% stenosis of the left ICA.  However previous ultrasounds have noted 60 to 79% stenosis of the left ICA.  The patient also has noted monophasic flow in the right subclavian artery    Review of Systems  Eyes:  Positive for visual disturbance.  All other systems reviewed and are negative.      Objective:   Physical Exam Vitals reviewed.  HENT:     Head: Normocephalic.  Cardiovascular:     Rate and Rhythm: Normal rate.  Pulmonary:     Effort: Pulmonary effort is normal.  Skin:    General: Skin is warm and dry.  Neurological:     Mental Status: She is alert and oriented to person, place, and time.  Psychiatric:        Mood and Affect: Mood normal.        Behavior: Behavior normal.        Thought Content: Thought content normal.        Judgment: Judgment normal.      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    COPD (chronic obstructive pulmonary disease) ([place])    Delirium 03/18/2012   Dyspnea    GERD (gastroesophageal reflux disease)    Heart murmur    Hypertension    Leaky heart valve    Oxygen deficit    uses O2 at night 2 liters   Peripheral vascular disease ([place])    Scoliosis    Sepsis ([place])    Shock ([place]) 03/23/2012   SIRS (systemic inflammatory response syndrome) ([place]) 03/23/2012   Spinal stenosis    Stroke [facility])    Eye   Tachycardia     Social History   Socioeconomic History   Marital status: Widowed  Spouse name: Not on file   Number of children: 2   Years of education: college    Highest education level: Not on file  Occupational History   Occupation: retired  Tobacco Use   Smoking status: Every Day    Packs/day: 0.50    Years: 40.00    Total pack years: 20.00    Types: Cigarettes   Smokeless tobacco: Never   Tobacco comments:    0.5PPD 08/31/2021  Vaping Use   Vaping Use: Former  Substance and Sexual Activity   Alcohol use: Yes    Comment: wine on occasion (1x/month)   Drug use: No   Sexual activity: Not Currently  Other Topics Concern   Not on file  Social History Narrative   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 And grandkids (66 & 77 y.o.)   Social Determinants of Health   Financial Resource Strain: Medium Risk (09/29/2021)   Overall Financial Resource Strain (CARDIA)    Difficulty of Paying Living Expenses: Somewhat hard  Food Insecurity: Food Insecurity Present  (09/29/2021)   Hunger Vital Sign    Worried About Running Out of Food in the Last Year: Sometimes true    Ran Out of Food in the Last Year: Sometimes true  Transportation Needs: No Transportation Needs (09/29/2021)   PRAPARE - Hydrologist (Medical): No    Lack of Transportation (Non-Medical): No  Physical Activity: Inactive (09/29/2021)   Exercise Vital Sign    Days of Exercise per Week: 0 days    Minutes of Exercise per Session: 0 min  Stress: Stress Concern Present (09/29/2021)   [place]    Feeling of Stress : To some extent  Social Connections: Socially Isolated (09/29/2021)   Social Connection and Isolation Panel [NHANES]    Frequency of Communication with Friends and Family: More than three times a week    Frequency of Social Gatherings with Friends and Family: Three times a week    Attends Religious Services: Never    Active Member of Clubs or Organizations: No    Attends Archivist Meetings: Never    Marital Status: Widowed  Intimate Partner Violence: Not At Risk (09/29/2021)   Humiliation, Afraid, Rape, and Kick questionnaire    Fear of Current or Ex-Partner: No    Emotionally Abused: No    Physically Abused: No    Sexually Abused: No    Past Surgical History:  Procedure Laterality Date   BACK SURGERY     BREAST LUMPECTOMY Left    CATARACT EXTRACTION W/PHACO Left 08/14/2019   Procedure: CATARACT EXTRACTION PHACO AND INTRAOCULAR LENS PLACEMENT (IOC) LEFT 10.22 00:59.7 42.4%;  Surgeon: Leandrew Koyanagi, MD;  Location: [place];  Service: Ophthalmology;  Laterality: Left;  leave it last patient per [place] Right 06/20/2018   Procedure: LOWER EXTREMITY ANGIOGRAPHY;  Surgeon: Algernon Huxley, MD;  Location: [place] [facility];  Service: Cardiovascular;  Laterality: Right;   LOWER EXTREMITY ANGIOGRAPHY Left 02/03/2020    Procedure: LOWER EXTREMITY ANGIOGRAPHY;  Surgeon: Algernon Huxley, MD;  Location: [place] [facility];  Service: Cardiovascular;  Laterality: Left;   right eye surgery     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   TOTAL ABDOMINAL HYSTERECTOMY     total   WISDOM TOOTH EXTRACTION      Family History  Problem Relation Age of Onset   Intracerebral hemorrhage Mother    Hypertension Mother  Heart failure Father    Schizophrenia Daughter    Rheum arthritis Maternal Grandmother    Kidney disease Maternal Grandmother    Heart disease Paternal Grandmother    Stroke Paternal Grandmother    Stroke Paternal Grandfather    Heart disease Paternal Grandfather     Allergies  Allergen Reactions   Other Itching    Cat Gut sutures    Oxycodone Hives   Amlodipine Hives   Augmentin [Amoxicillin-Pot Clavulanate] Diarrhea   Azithromycin Hives   Contrast Media [Iodinated Contrast Media] Hives   Doxycycline Diarrhea   Erythromycin Diarrhea   Hydrocodone Nausea Only   Lactose Intolerance (Gi) Diarrhea    Bloating   Minocycline Diarrhea   Nsaids Other (See Comments)    Internal Bleeding   Relafen [Nabumetone] Hives   Sulfa Antibiotics Hives   Synthroid [Levothyroxine] Diarrhea   Tetracyclines & Related Diarrhea   Tilactase    Tramadol Rash    Takes +/- Benadryl       Latest Ref Rng & Units 05/31/2022    3:17 PM 10/19/2021    1:29 PM 07/20/2021    3:29 PM  CBC  WBC 3.4 - 10.8 x10E3/uL 11.1  10.7  19.4   Hemoglobin 11.1 - 15.9 g/dL 13.0  13.2  14.7   Hematocrit 34.0 - 46.6 % 39.6  39.6  42.8   Platelets 150 - 450 x10E3/uL 469  444  704       CMP     Component Value Date/Time   NA 141 05/31/2022 1517   NA 132 (L) 09/12/2012 1540   K 3.9 05/31/2022 1517   K 3.8 09/11/2012 0401   CL 102 05/31/2022 1517   CL 99 09/11/2012 0401   CO2 24 05/31/2022 1517   CO2 24 09/11/2012 0401   GLUCOSE 83 05/31/2022 1517   GLUCOSE 137 (H) 07/12/2021 0533   GLUCOSE 262 (H) 09/11/2012 0401   BUN 10  05/31/2022 1517   BUN 22 (H) 09/11/2012 0401   CREATININE 0.94 05/31/2022 1517   CREATININE 0.86 09/11/2012 0401   CALCIUM 9.7 05/31/2022 1517   CALCIUM 8.5 09/11/2012 0401   PROT 6.8 05/31/2022 1517   ALBUMIN 4.6 05/31/2022 1517   AST 16 05/31/2022 1517   ALT 9 05/31/2022 1517   ALKPHOS 71 05/31/2022 1517   BILITOT 0.2 05/31/2022 1517   GFRNONAA >60 07/12/2021 0533   GFRNONAA >60 09/11/2012 0401   GFRAA 90 09/10/2020 1409   GFRAA >60 09/11/2012 0401     No results found.     Assessment & Plan:   1. Bilateral carotid artery stenosis The patient is beginning to exhibit symptoms that may suggest that she has become symptomatic.  The symptoms that she describes are concerning for possible amaurosis fugax.  Given her remote history of a 60 to 79% stenosis I suspect that her stenosis may be greater than estimated as sometimes more calcified plaque can be underestimated on ultrasound.  Patient should undergo CT angiography of the carotid arteries to define the degree of stenosis of the internal carotid arteries bilaterally and the anatomic suitability for surgery vs. intervention.  If the patient does indeed need surgery cardiac clearance will be required, once cleared the patient will be scheduled for surgery.  The risks, benefits and alternative therapies were reviewed in detail with the patient.  All questions were answered.  The patient agrees to proceed with imaging.  Continue antiplatelet therapy as prescribed. Continue management of CAD, HTN and Hyperlipidemia. Healthy heart diet, encouraged  exercise at least 4 times per week.  - CT ANGIO NECK W OR WO CONTRAST; Future  2. Atherosclerosis of native artery of both lower extremities with intermittent claudication (HCC) Currently stable.  We will have the patient follow-up as needed.  3. Primary hypertension Continue antihypertensive medications as already ordered, these medications have been reviewed and there are no changes  at this time.   Current Outpatient Medications on File Prior to Visit  Medication Sig Dispense Refill   acetaminophen (TYLENOL) 500 MG tablet Take 500 mg by mouth every 6 (six) hours as needed.     albuterol (VENTOLIN HFA) 108 (90 Base) MCG/ACT inhaler Inhale 2 puffs into the lungs every 6 (six) hours as needed for wheezing or shortness of breath. 8 g 6   atenolol (TENORMIN) 25 MG tablet Take 1 tablet (25 mg total) by mouth daily. 90 tablet 1   B-COMPLEX-C PO Take 1 tablet by mouth daily.     benazepril (LOTENSIN) 10 MG tablet Take 1 tablet (10 mg total) by mouth daily. 90 tablet 1   cilostazol (PLETAL) 100 MG tablet Take 1 tablet (100 mg total) by mouth 2 (two) times daily. 180 tablet 1   Collagen-Vitamin C-Biotin (COLLAGEN 1500/C) 500-50-0.8 MG CAPS Take by mouth.     cyclobenzaprine (FLEXERIL) 5 MG tablet Take 1 tablet (5 mg total) by mouth 3 (three) times daily as needed for muscle spasms. 30 tablet 6   diphenhydrAMINE (BENADRYL) 25 MG tablet Take 25 mg by mouth every 6 (six) hours as needed.      fluticasone (FLONASE) 50 MCG/ACT nasal spray 2 sprays into each nostril in the morning. Up to three sprays.     gabapentin (NEURONTIN) 300 MG capsule Take 1 capsule (300 mg total) by mouth 2 (two) times daily. 180 capsule 6   hydrOXYzine (ATARAX) 25 MG tablet Take 1 tablet (25 mg total) by mouth 3 (three) times daily as needed. 270 tablet 3   lidocaine (LIDODERM) 5 % Place 1 patch onto the skin daily. Remove & Discard patch within 12 hours or as directed by MD 60 patch 6   Misc Natural Products (JOINT HEALTH PO) Take by mouth daily.     montelukast (SINGULAIR) 10 MG tablet Take 1 tablet (10 mg total) by mouth at bedtime. 90 tablet 3   Multiple Vitamins-Minerals (MULTIVITAMIN WITH MINERALS) tablet Take 1 tablet by mouth daily.     NIACINAMIDE PO Take 1 capsule by mouth daily.     omeprazole (PRILOSEC) 20 MG capsule Take 1 capsule (20 mg total) by mouth daily. 90 capsule 3   traMADol (ULTRAM) 50 MG  tablet Take 1 tablet (50 mg total) by mouth every 6 (six) hours as needed for severe pain. Must last 30 days 120 tablet 2   TRELEGY ELLIPTA 100-62.5-25 MCG/ACT AEPB Inhale 1 puff into the lungs daily for 1 day. 60 each 5   Current Facility-Administered Medications on File Prior to Visit  Medication Dose Route Frequency Provider Last Rate Last Admin   albuterol (PROVENTIL) (2.5 MG/3ML) 0.083% nebulizer solution 2.5 mg  2.5 mg Nebulization Once Johnson, Megan P, DO        There are no Patient Instructions on file for this visit. No follow-ups on file.   Kris Hartmann, NP

## 2022-08-17 ENCOUNTER — Telehealth (INDEPENDENT_AMBULATORY_CARE_PROVIDER_SITE_OTHER): Payer: Self-pay | Admitting: Nurse Practitioner

## 2022-08-17 NOTE — Telephone Encounter (Signed)
LVM for pt advising that she needs to call radiology scheduling at [phone] and make her CT appt. After making the appt for CT, she will need to call us back at the office to schedule a CT results appt with Dr. Lucky Cowboy.

## 2022-08-18 ENCOUNTER — Telehealth (INDEPENDENT_AMBULATORY_CARE_PROVIDER_SITE_OTHER): Payer: Self-pay

## 2022-08-18 NOTE — Telephone Encounter (Signed)
Contrast protocol for schedule CT on 08/25/22 has been called into pharmacy and patient has been aware.

## 2022-08-25 ENCOUNTER — Ambulatory Visit
Admission: RE | Admit: 2022-08-25 | Discharge: 2022-08-25 | Disposition: A | Discharge status: Home / Self Care | Payer: Medicare Other | Source: Ambulatory Visit | Attending: Nurse Practitioner | Admitting: Nurse Practitioner

## 2022-08-25 DIAGNOSIS — I6523 Occlusion and stenosis of bilateral carotid arteries: Secondary | ICD-10-CM | POA: Insufficient documentation

## 2022-08-25 LAB — POCT I-STAT CREATININE: Creatinine, Ser: 0.8 mg/dL (ref 0.44–1.00)

## 2022-08-25 MED ORDER — IOHEXOL 350 MG/ML SOLN
75.0000 mL | Freq: Once | INTRAVENOUS | Status: AC | PRN
  Administered 2022-08-25: 75 mL via INTRAVENOUS

## 2022-08-26 NOTE — Progress Notes (Signed)
Let's get her in to see Dew on Tuesday

## 2022-08-30 ENCOUNTER — Ambulatory Visit (INDEPENDENT_AMBULATORY_CARE_PROVIDER_SITE_OTHER): Payer: Medicare Other | Admitting: Vascular Surgery

## 2022-08-30 ENCOUNTER — Encounter (INDEPENDENT_AMBULATORY_CARE_PROVIDER_SITE_OTHER): Payer: Self-pay | Admitting: Vascular Surgery

## 2022-08-30 VITALS — BP 144/84 | HR 91 | Ht 63.0 in | Wt 112.0 lb

## 2022-08-30 DIAGNOSIS — H547 Unspecified visual loss: Secondary | ICD-10-CM | POA: Diagnosis not present

## 2022-08-30 DIAGNOSIS — I70213 Atherosclerosis of native arteries of extremities with intermittent claudication, bilateral legs: Secondary | ICD-10-CM

## 2022-08-30 DIAGNOSIS — I10 Essential (primary) hypertension: Secondary | ICD-10-CM

## 2022-08-30 DIAGNOSIS — I6523 Occlusion and stenosis of bilateral carotid arteries: Secondary | ICD-10-CM

## 2022-08-30 MED ORDER — CLOPIDOGREL BISULFATE 75 MG PO TABS: 75.0000 mg | ORAL_TABLET | Freq: Every day | ORAL | 6 refills | Status: DC

## 2022-08-30 MED ORDER — ATORVASTATIN CALCIUM 10 MG PO TABS: 10.0000 mg | ORAL_TABLET | Freq: Every day | ORAL | 11 refills | Status: DC

## 2022-08-30 NOTE — Assessment & Plan Note (Signed)
Possibly related to carotid disease.

## 2022-08-30 NOTE — [place] Notes (Signed)
MRN : [id number]  Savannah Mcclure is a 82 y.o. (04-19-1941) female who presents with chief complaint of  Chief Complaint  Patient presents with   Follow-up    CT results  .  History of Present Illness: Patient returns today in follow up of her carotid disease.  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  She has had no major changes since her last visit a few weeks ago.  Since that time, she has undergone a CT angiogram of the neck which I have independently reviewed.  This demonstrates a high-grade right carotid artery stenosis which is calcific.  The estimated degree of stenosis on the left was reported at 47% but certainly appears to be more significant than that to me.  These lesions are also relatively high occurring about the level of C2.  This is significantly worse on the right than the reported duplex with a highly calcific nature of the disease may limit our visualization with duplex.  Current Outpatient Medications  Medication Sig Dispense Refill   acetaminophen (TYLENOL) 500 MG tablet Take 500 mg by mouth every 6 (six) hours as needed.     albuterol (VENTOLIN HFA) 108 (90 Base) MCG/ACT inhaler Inhale 2 puffs into the lungs every 6 (six) hours as needed for wheezing or shortness of breath. 8 g 6   atenolol (TENORMIN) 25 MG tablet Take 1 tablet (25 mg total) by mouth daily. 90 tablet 1   atorvastatin (LIPITOR) 10 MG tablet Take 1 tablet (10 mg total) by mouth daily. 30 tablet 11   B-COMPLEX-C PO Take 1 tablet by mouth daily.     benazepril (LOTENSIN) 10 MG tablet Take 1 tablet (10 mg total) by mouth daily. 90 tablet 1   cilostazol (PLETAL) 100 MG tablet Take 1 tablet (100 mg total) by mouth 2 (two) times daily. 180 tablet 1   clopidogrel (PLAVIX) 75 MG tablet Take 1 tablet (75 mg total) by mouth daily. 30 tablet 6   Collagen-Vitamin C-Biotin (COLLAGEN 1500/C) 500-50-0.8 MG CAPS Take by mouth.     cyclobenzaprine (FLEXERIL) 5 MG tablet Take 1  tablet (5 mg total) by mouth 3 (three) times daily as needed for muscle spasms. 30 tablet 6   diphenhydrAMINE (BENADRYL) 25 MG tablet Take 25 mg by mouth every 6 (six) hours as needed.      fluticasone (FLONASE) 50 MCG/ACT nasal spray 2 sprays into each nostril in the morning. Up to three sprays.     gabapentin (NEURONTIN) 300 MG capsule Take 1 capsule (300 mg total) by mouth 2 (two) times daily. 180 capsule 6   hydrOXYzine (ATARAX) 25 MG tablet Take 1 tablet (25 mg total) by mouth 3 (three) times daily as needed. 270 tablet 3   lidocaine (LIDODERM) 5 % Place 1 patch onto the skin daily. Remove & Discard patch within 12 hours or as directed by MD 60 patch 6   Misc Natural Products (JOINT HEALTH PO) Take by mouth daily.     montelukast (SINGULAIR) 10 MG tablet Take 1 tablet (10 mg total) by mouth at bedtime. 90 tablet 3   Multiple Vitamins-Minerals (MULTIVITAMIN WITH MINERALS) tablet Take 1 tablet by mouth daily.     NIACINAMIDE PO Take 1 capsule by mouth daily.     omeprazole (PRILOSEC) 20 MG capsule Take 1 capsule (20 mg total) by mouth daily. 90 capsule 3   thyroid (ARMOUR THYROID) 90 MG tablet Take 1 tablet (90 mg total) by mouth  every other day. 90 tablet 3   traMADol (ULTRAM) 50 MG tablet Take 1 tablet (50 mg total) by mouth every 6 (six) hours as needed for severe pain. Must last 30 days 120 tablet 2   TRELEGY ELLIPTA 100-62.5-25 MCG/ACT AEPB Inhale 1 puff into the lungs daily for 1 day. 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    COPD (chronic obstructive pulmonary disease) ([place])    Delirium 03/18/2012   Dyspnea    GERD (gastroesophageal reflux disease)    Heart murmur    Hypertension    Leaky heart valve    Oxygen deficit    uses O2 at night 2 liters   Peripheral vascular disease ([place])    Scoliosis    Sepsis ([place])    Shock ([place]) 03/23/2012   SIRS (systemic inflammatory response syndrome) ([place]) 03/23/2012   Spinal stenosis    Stroke [facility])    Eye   Tachycardia     Past Surgical History:  Procedure Laterality Date   BACK SURGERY     BREAST LUMPECTOMY Left    CATARACT EXTRACTION W/PHACO Left 08/14/2019   Procedure: CATARACT EXTRACTION PHACO AND INTRAOCULAR LENS PLACEMENT (IOC) LEFT 10.22 00:59.7 42.4%;  Surgeon: Leandrew Koyanagi, MD;  Location: [place];  Service: Ophthalmology;  Laterality: Left;  leave it last patient per [place] Right 06/20/2018   Procedure: LOWER EXTREMITY ANGIOGRAPHY;  Surgeon: Algernon Huxley, MD;  Location: [place] [facility];  Service: Cardiovascular;  Laterality: Right;   LOWER EXTREMITY ANGIOGRAPHY Left 02/03/2020   Procedure: LOWER EXTREMITY ANGIOGRAPHY;  Surgeon: Algernon Huxley, MD;  Location: [place] [facility];  Service: Cardiovascular;  Laterality: Left;   right eye surgery     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   TOTAL ABDOMINAL HYSTERECTOMY     total   WISDOM TOOTH EXTRACTION       Social History   Tobacco Use   Smoking status: Every Day    Packs/day: 0.50    Years: 40.00    Total pack years: 20.00    Types: Cigarettes   Smokeless tobacco: Never   Tobacco comments:    0.5PPD 08/31/2021  Vaping Use   Vaping Use: Former  Substance Use Topics   Alcohol use: Yes    Comment: wine on occasion (1x/month)   Drug use: No      Family History  Problem Relation Age of Onset   Intracerebral hemorrhage Mother     Hypertension Mother    Heart failure Father    Schizophrenia Daughter    Rheum arthritis Maternal Grandmother    Kidney disease Maternal Grandmother    Heart disease Paternal Grandmother    Stroke Paternal Grandmother    Stroke Paternal Grandfather    Heart disease Paternal Grandfather  Allergies  Allergen Reactions   Other Itching    Cat Gut sutures    Oxycodone Hives   Amlodipine Hives   Augmentin [Amoxicillin-Pot Clavulanate] Diarrhea   Azithromycin Hives   Contrast Media [Iodinated Contrast Media] Hives   Doxycycline Diarrhea   Erythromycin Diarrhea   Hydrocodone Nausea Only   Lactose Intolerance (Gi) Diarrhea    Bloating   Minocycline Diarrhea   Nsaids Other (See Comments)    Internal Bleeding   Relafen [Nabumetone] Hives   Sulfa Antibiotics Hives   Synthroid [Levothyroxine] Diarrhea   Tetracyclines & Related Diarrhea   Tilactase    Tramadol Rash    Takes +/- Benadryl    REVIEW OF SYSTEMS (Negative unless checked)   Constitutional: '[]'$ Weight loss  '[]'$ Fever  '[]'$ Chills Cardiac: '[]'$ Chest pain   '[]'$ Chest pressure   '[x]'$ Palpitations   '[]'$ Shortness of breath when laying flat   '[]'$ Shortness of breath at rest   '[]'$ Shortness of breath with exertion. Vascular:  '[x]'$ Pain in legs with walking   '[x]'$ Pain in legs at rest   '[]'$ Pain in legs when laying flat   '[]'$ Claudication   '[]'$ Pain in feet when walking  '[]'$ Pain in feet at rest  '[]'$ Pain in feet when laying flat   '[]'$ History of DVT   '[]'$ Phlebitis   '[]'$ Swelling in legs   '[]'$ Varicose veins   '[]'$ Non-healing ulcers Pulmonary:   '[]'$ Uses home oxygen   '[]'$ Productive cough   '[]'$ Hemoptysis   '[]'$ Wheeze  '[]'$ COPD   '[]'$ Asthma Neurologic:  '[]'$ Dizziness  '[]'$ Blackouts   '[]'$ Seizures   '[x]'$ History of stroke   '[]'$ History of TIA  '[]'$ Aphasia   '[]'$ Temporary blindness   '[]'$ Dysphagia   '[]'$ Weakness or numbness in arms   '[]'$ Weakness or numbness in legs Musculoskeletal:  '[x]'$ Arthritis   '[]'$ Joint swelling   '[]'$ Joint pain   '[]'$ Low back pain Hematologic:  '[]'$ Easy bruising  '[]'$ Easy bleeding    '[]'$ Hypercoagulable state   '[]'$ Anemic   Gastrointestinal:  '[x]'$ Blood in stool   '[]'$ Vomiting blood  '[x]'$ Gastroesophageal reflux/heartburn   '[]'$ Abdominal pain Genitourinary:  '[]'$ Chronic kidney disease   '[]'$ Difficult urination  '[]'$ Frequent urination  '[]'$ Burning with urination   '[]'$ Hematuria Skin:  '[]'$ Rashes   '[]'$ Ulcers   '[]'$ Wounds Psychological:  '[]'$ History of anxiety   '[]'$  History of major depression.  Physical Examination  BP (!) 144/84   Pulse 91   Ht '5\' 3"'$  (1.6 m)   Wt 112 lb (50.8 kg)   BMI 19.84 kg/m  Gen:   NAD, thin and frail appearing Head: [place]/AT, + temporalis wasting. Ear/Nose/Throat: Hearing grossly intact, nares w/o erythema or drainage Eyes: Conjunctiva clear. Sclera non-icteric Neck: Supple.  Trachea midline Pulmonary:  Good air movement, no use of accessory muscles.  Cardiac: RRR, no JVD Vascular:  Vessel Right Left  Radial Palpable Palpable               Musculoskeletal: M/S 5/5 throughout.  No deformity or atrophy. No edema. Neurologic: Sensation grossly intact in extremities.  Symmetrical.  Speech is fluent.  Psychiatric: Judgment intact, Mood & affect appropriate for pt's clinical situation. Dermatologic: No rashes or ulcers noted.  No cellulitis or open wounds.      Labs Recent Results (from the past 2160 hour(s))  [employer] Select 13 (MW), Urine     Status: None   Collection Time: 06/14/22  2:18 PM  Result Value Ref Range   Summary Note     Comment: ==================================================================== Tox[employer] Select 13 (MW) ==================================================================== Test  Result       Flag       Units  Drug Present and Declared for Prescription Verification   Tramadol                       >7042        EXPECTED   ng/mg creat   O-Desmethyltramadol            >7042        EXPECTED   ng/mg creat   N-Desmethyltramadol            >7042        EXPECTED   ng/mg creat    Source of tramadol is a  prescription medication. O-desmethyltramadol    and N-desmethyltramadol are expected metabolites of tramadol.  ==================================================================== Test                      Result    Flag   Units      Ref Range   Creatinine              71               mg/dL      >=20 ==================================================================== Declared Medications:  The flagging and interpretation on this report are based on the  follo wing declared medications.  Unexpected results may arise from  inaccuracies in the declared medications.   **Note: The testing scope of this panel includes these medications:   Tramadol (Ultram)   **Note: The testing scope of this panel does not include the  following reported medications:   Acetaminophen (Tylenol)  Albuterol (Ventolin HFA)  Atenolol (Tenormin)  Benazepril (Lotensin)  Cilostazol (Pletal)  Cyclobenzaprine (Flexeril)  Diphenhydramine (Benadryl)  Fluticasone (Flonase)  Fluticasone (Trelegy)  Gabapentin (Neurontin)  Hydroxyzine (Atarax)  Montelukast (Singulair)  Multivitamin  Niacinamide  Omeprazole (Prilosec)  Supplement  Thyroid: Liothyronine/Levothyroxine ([place])  Topical Lidocaine (Lidoderm)  Umeclidinium (Trelegy)  Vilanterol (Trelegy)  Vitamin B  Vitamin C ==================================================================== For clinical consultation, please call [phone]. ======================================================= =============   TSH     Status: None   Collection Time: 07/21/22  3:22 PM  Result Value Ref Range   TSH 3.880 0.450 - 4.500 uIU/mL  Sed Rate (ESR)     Status: None   Collection Time: 07/21/22  3:22 PM  Result Value Ref Range   Sed Rate 25 0 - 40 mm/hr  I-STAT creatinine     Status: None   Collection Time: 08/25/22  3:34 PM  Result Value Ref Range   Creatinine, Ser 0.80 0.44 - 1.00 mg/dL    Radiology CT ANGIO NECK W OR WO CONTRAST  Result Date:  08/26/2022 CLINICAL DATA:  82 year old female with carotid artery stenosis, beginning to become symptomatic. EXAM: CT ANGIOGRAPHY NECK TECHNIQUE: Multidetector CT imaging of the neck was performed using the standard protocol during bolus administration of intravenous contrast. Multiplanar CT image reconstructions and MIPs were obtained to evaluate the vascular anatomy. Carotid stenosis measurements (when applicable) are obtained utilizing NASCET criteria, using the distal internal carotid diameter as the denominator. RADIATION DOSE REDUCTION: This exam was performed according to the departmental dose-optimization program which includes automated exposure control, adjustment of the mA and/or kV according to patient size and/or use of iterative reconstruction technique. CONTRAST:  50m OMNIPAQUE IOHEXOL 350 MG/ML SOLN COMPARISON:  Chest CT 05/19/2021. FINDINGS: Skeleton: No acute osseous abnormality identified. Cervical spine and TMJ degeneration. Partially visible thoracic scoliosis. Upper chest: Chronic lung  disease with stable 8 mm right lung pulmonary nodule since the previous chest CT, and also prior exam 11/04/2020 (benign, no follow-up imaging recommended). Centrilobular emphysema and chronic right apical lung scarring. No superior mediastinal lymphadenopathy. Other neck: No acute finding in the neck. Right side phthisis bulbi. Grossly negative visible noncontrast brain parenchyma. Aortic arch: Calcified aortic atherosclerosis. 3 vessel arch configuration. Right carotid system: Brachiocephalic artery origin plaque without stenosis. Negative right CCA origin. No plaque proximal to the right carotid bifurcation. But at the bifurcation bulky soft and calcified plaque combine for high-grade origin stenosis approaching a radiographic string sign on series 6, image 107, also series 8, image 107. Calcified plaque continues into the right ICA bulb. Right ICA remains patent to the skull base. Left carotid system: Left  CCA origin plaque without stenosis. No left CCA plaque before the bifurcation. But at the left carotid bifurcation complex mostly soft plaque results in high-grade stenosis at the left ICA origin on series 6, image 111 and series 8, image 106 numerically estimated at 65 % with respect to the distal vessel. Additional left bulb plaque. Left ICA remains patent to the skull base. Beaded appearance of the left ICA distal to the bulb in keeping with fibromuscular dysplasia (series 8, image 101). Mild calcified left ICA plaque below the skull base. Vertebral arteries: Proximal right subclavian artery calcified plaque without stenosis. Calcified plaque at the right vertebral artery origin with mild to moderate stenosis. Right vertebral is patent to the vertebrobasilar junction without additional stenosis. Proximal left subclavian artery atherosclerosis with estimated 50% stenosis before the left vertebral artery origin on series 8, image 99. Left vertebral artery origin is patent with soft plaque and moderate stenosis on series 6, image 209. Left vertebral is codominant and patent to the vertebrobasilar junction without additional stenosis. Limited CTA HEAD Posterior circulation: Patent PICA origins. Visible basilar artery is patent. Anterior circulation: Both visible ICA siphons are patent with at least moderate calcified plaque in the cavernous segments. Anatomic variants: None. Review of the MIP images confirms the above findings IMPRESSION: 1. Positive for bulky atherosclerosis at both carotid bifurcations resulting in: - High-grade Right ICA origin stenosis approaching a RADIOGRAPHIC-STRING-SIGN. - Left ICA origin estimated at 65%. - partially visible ICA siphon calcified plaque. 2. Proximal subclavian artery atherosclerosis greater on the left with estimated 50% stenosis before the Left Vertebral Artery origin. And up to Moderate bilateral Vertebral Artery origin stenosis. 3. Aortic Atherosclerosis (ICD10-I70.0) and  Emphysema (ICD10-J43.9). These results will be called to the ordering clinician or representative by the Radiologist Assistant, and communication documented in the PACS or [employer]. Electronically Signed   By: Genevie Ann M.D.   On: 08/26/2022 12:28    Assessment/Plan HTN (hypertension) blood pressure control important in reducing the progression of atherosclerotic disease. On appropriate oral medications.  Atherosclerosis of native arteries of extremity with intermittent claudication ([place]) To be checked later this year.  Her previous iliac stents may limit our ability to perform carotid stenting depending on what we find at the time of angiogram.  Vision loss Possibly related to carotid disease.  Carotid stenosis she has undergone a CT angiogram of the neck which I have independently reviewed.  This demonstrates a high-grade right carotid artery stenosis which is calcific.  The estimated degree of stenosis on the left was reported at 16% but certainly appears to be more significant than that to me.  These lesions are also relatively high occurring about the level of C2.  This is  significantly worse on the right than the reported duplex with a highly calcific nature of the disease may limit our visualization with duplex. We had a long discussion today about her carotid disease.  In a high-grade lesion that is likely symptomatic from previous embolization to her right eye, this represents a critical situation and revascularization is certainly indicated.  We discussed the differences between carotid endarterectomy and carotid stenting.  Her lesion is quite high at the level of C2 and getting there surgically may be difficult and/or impossible.  The only saving grace from that would be her small frame and then neck.  Nonetheless, angiography may also help [place] further characterize the lesion that has quite disparate findings on CT scan and duplex and if anatomically appropriate, carotid stenting could  be performed concomitantly.  This may also give [place] some better visualization of the left carotid stenosis that appears to be greater than 65% reported to me.  She is agreeable to carotid angiography with potential stenting to be performed in the near future.  I will send her in prescription for Plavix and Lipitor that she should begin several days prior to the procedure.  I discussed the risks and benefits of the procedure.  I also discussed the differences between carotid endarterectomy and carotid stenting.    Leotis Pain, MD  08/30/2022 3:53 PM    This note was created with Dragon medical transcription system.  Any errors from dictation are purely unintentional

## 2022-08-30 NOTE — Patient Instructions (Signed)
Carotid Angioplasty With Stent, Care After The following information offers guidance on how to care for yourself after your procedure. Your health care provider may also give you more specific instructions. If you have problems or questions, contact your health care provider. What can I expect after the procedure? After the procedure, it is common to have: Bruising at the catheter site. This usually goes away within 1-2 weeks. A small amount of blood or clear fluid coming from your incision. A small amount of blood collecting underneath your skin (hematoma) around the catheter site. This may form a lump that can be sore and tender. This usually lasts for 1-2 weeks. You may have a test that uses sound waves to take pictures (ultrasound) of the carotid artery. This ultrasound may be compared to future ultrasounds to check for changes in the artery. Follow these instructions at home: Medicines Take over-the-counter and prescription medicines only as told by your health care provider. Your health care provider may prescribe blood thinners after the procedure. This will help to prevent blood clots. If you are taking blood thinners: Talk with your health care provider before you take any medicines that contain aspirin or NSAIDs, such as ibuprofen. These medicines increase your risk for dangerous bleeding. Take your medicine exactly as told, at the same time every day. Avoid activities that could cause injury or bruising, and follow instructions about how to prevent falls. Wear a medical alert bracelet or carry a card that lists what medicines you take. Incision care  Follow instructions from your health care provider about how to take care of your incision. Make sure you: Wash your hands for at least 20 seconds with soap and water before and after you change your bandage (dressing). If soap and water are not available, use hand sanitizer. Change your dressing as told by your health care  provider. Leave stitches (sutures), skin glue, or adhesive strips in place. These skin closures may stay in place for up to two weeks. If adhesive strip edges start to loosen and curl up, you may trim the loose edges. Do not remove adhesive strips completely unless your health care provider tells you to do that. Do not rub the site because this may cause bleeding. Keep your incision area clean and dry. Check your incision area every day for signs of infection. Check for: More redness, swelling, or pain. More fluid or blood. Warmth. Pus or a bad smell. Activity Return to your normal activities as told by your health care provider. Ask your health care provider what activities are safe for you. You may have to avoid lifting. Ask your health care provider how much you can safely lift. Avoid sex until your health care provider says that this is safe for you. Eating and drinking Follow instructions from your health care provider about what you may eat and drink. Drink enough fluid to keep your urine pale yellow. General instructions To keep the incision from bleeding, avoid straining while having a bowel movement. Do not use any products that contain nicotine or tobacco. These products include cigarettes, chewing tobacco, and vaping devices, such as e-cigarettes. If you need help quitting, ask your health care provider. Tell all your health care providers that you have a stent. Keep all follow-up visits. You may need ultrasound appointments to check for any changes in the artery. Contact a health care provider if: You have any infection signs. You have a lump caused by a hematoma that does not go away after 2 weeks.  You have a fever. Get help right away if: You have trouble breathing. You have chest pain. You have a hematoma that is quickly getting larger. You have bleeding from your incision, and it does not stop after you hold pressure on it for a few minutes. You have pain in the area  where your stent was placed. You have any symptoms of a stroke. Certain factors may put you at risk for a stroke even after this procedure. These may include diabetes, chronic lung disease, chronic kidney disease, previous history of a stroke, and being 50 years of age or older. " "BE FAST" is an easy way to remember the main warning signs of a stroke: B - Balance. Signs are dizziness, sudden trouble walking, or loss of balance. E - Eyes. Signs are trouble seeing or a sudden change in vision. F - Face. Signs are sudden weakness or numbness of the face, or the face or eyelid drooping on one side. A - Arms. Signs are weakness or numbness in an arm. This happens suddenly and usually on one side of the body. S - Speech. Signs are sudden trouble speaking, slurred speech, or trouble understanding what people say. T - Time. Time to call emergency services. Write down what time symptoms started. You have other signs of a stroke, such as: A sudden, severe headache with no known cause. Nausea or vomiting. Seizure. These symptoms may be an emergency. Get help right away. Call 911. Do not wait to see if the symptoms will go away. Do not drive yourself to the hospital. This information is not intended to replace advice given to you by your health care provider. Make sure you discuss any questions you have with your health care provider. Document Revised: 12/28/2021 Document Reviewed: 12/28/2021 Elsevier Patient Education  [place].

## 2022-08-30 NOTE — H&P (View-Only) (Signed)
MRN : [id number]  Savannah Mcclure is a 82 y.o. (04-Mar-1941) female who presents with chief complaint of  Chief Complaint  Patient presents with   Follow-up    CT results  .  History of Present Illness: Patient returns today in follow up of her carotid disease.  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  She has had no major changes since her last visit a few weeks ago.  Since that time, she has undergone a CT angiogram of the neck which I have independently reviewed.  This demonstrates a high-grade right carotid artery stenosis which is calcific.  The estimated degree of stenosis on the left was reported at 51% but certainly appears to be more significant than that to me.  These lesions are also relatively high occurring about the level of C2.  This is significantly worse on the right than the reported duplex with a highly calcific nature of the disease may limit our visualization with duplex.  Current Outpatient Medications  Medication Sig Dispense Refill   acetaminophen (TYLENOL) 500 MG tablet Take 500 mg by mouth every 6 (six) hours as needed.     albuterol (VENTOLIN HFA) 108 (90 Base) MCG/ACT inhaler Inhale 2 puffs into the lungs every 6 (six) hours as needed for wheezing or shortness of breath. 8 g 6   atenolol (TENORMIN) 25 MG tablet Take 1 tablet (25 mg total) by mouth daily. 90 tablet 1   atorvastatin (LIPITOR) 10 MG tablet Take 1 tablet (10 mg total) by mouth daily. 30 tablet 11   B-COMPLEX-C PO Take 1 tablet by mouth daily.     benazepril (LOTENSIN) 10 MG tablet Take 1 tablet (10 mg total) by mouth daily. 90 tablet 1   cilostazol (PLETAL) 100 MG tablet Take 1 tablet (100 mg total) by mouth 2 (two) times daily. 180 tablet 1   clopidogrel (PLAVIX) 75 MG tablet Take 1 tablet (75 mg total) by mouth daily. 30 tablet 6   Collagen-Vitamin C-Biotin (COLLAGEN 1500/C) 500-50-0.8 MG CAPS Take by mouth.     cyclobenzaprine (FLEXERIL) 5 MG tablet Take 1  tablet (5 mg total) by mouth 3 (three) times daily as needed for muscle spasms. 30 tablet 6   diphenhydrAMINE (BENADRYL) 25 MG tablet Take 25 mg by mouth every 6 (six) hours as needed.      fluticasone (FLONASE) 50 MCG/ACT nasal spray 2 sprays into each nostril in the morning. Up to three sprays.     gabapentin (NEURONTIN) 300 MG capsule Take 1 capsule (300 mg total) by mouth 2 (two) times daily. 180 capsule 6   hydrOXYzine (ATARAX) 25 MG tablet Take 1 tablet (25 mg total) by mouth 3 (three) times daily as needed. 270 tablet 3   lidocaine (LIDODERM) 5 % Place 1 patch onto the skin daily. Remove & Discard patch within 12 hours or as directed by MD 60 patch 6   Misc Natural Products (JOINT HEALTH PO) Take by mouth daily.     montelukast (SINGULAIR) 10 MG tablet Take 1 tablet (10 mg total) by mouth at bedtime. 90 tablet 3   Multiple Vitamins-Minerals (MULTIVITAMIN WITH MINERALS) tablet Take 1 tablet by mouth daily.     NIACINAMIDE PO Take 1 capsule by mouth daily.     omeprazole (PRILOSEC) 20 MG capsule Take 1 capsule (20 mg total) by mouth daily. 90 capsule 3   thyroid (ARMOUR THYROID) 90 MG tablet Take 1 tablet (90 mg total) by mouth  every other day. 90 tablet 3   traMADol (ULTRAM) 50 MG tablet Take 1 tablet (50 mg total) by mouth every 6 (six) hours as needed for severe pain. Must last 30 days 120 tablet 2   TRELEGY ELLIPTA 100-62.5-25 MCG/ACT AEPB Inhale 1 puff into the lungs daily for 1 day. 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    COPD (chronic obstructive pulmonary disease) ([place])    Delirium 03/18/2012   Dyspnea    GERD (gastroesophageal reflux disease)    Heart murmur    Hypertension    Leaky heart valve    Oxygen deficit    uses O2 at night 2 liters   Peripheral vascular disease ([place])    Scoliosis    Sepsis ([place])    Shock ([place]) 03/23/2012   SIRS (systemic inflammatory response syndrome) ([place]) 03/23/2012   Spinal stenosis    Stroke [facility])    Eye   Tachycardia     Past Surgical History:  Procedure Laterality Date   BACK SURGERY     BREAST LUMPECTOMY Left    CATARACT EXTRACTION W/PHACO Left 08/14/2019   Procedure: CATARACT EXTRACTION PHACO AND INTRAOCULAR LENS PLACEMENT (IOC) LEFT 10.22 00:59.7 42.4%;  Surgeon: Leandrew Koyanagi, MD;  Location: [place];  Service: Ophthalmology;  Laterality: Left;  leave it last patient per [place] Right 06/20/2018   Procedure: LOWER EXTREMITY ANGIOGRAPHY;  Surgeon: Algernon Huxley, MD;  Location: [place] [facility];  Service: Cardiovascular;  Laterality: Right;   LOWER EXTREMITY ANGIOGRAPHY Left 02/03/2020   Procedure: LOWER EXTREMITY ANGIOGRAPHY;  Surgeon: Algernon Huxley, MD;  Location: [place] [facility];  Service: Cardiovascular;  Laterality: Left;   right eye surgery     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   TOTAL ABDOMINAL HYSTERECTOMY     total   WISDOM TOOTH EXTRACTION       Social History   Tobacco Use   Smoking status: Every Day    Packs/day: 0.50    Years: 40.00    Total pack years: 20.00    Types: Cigarettes   Smokeless tobacco: Never   Tobacco comments:    0.5PPD 08/31/2021  Vaping Use   Vaping Use: Former  Substance Use Topics   Alcohol use: Yes    Comment: wine on occasion (1x/month)   Drug use: No      Family History  Problem Relation Age of Onset   Intracerebral hemorrhage Mother     Hypertension Mother    Heart failure Father    Schizophrenia Daughter    Rheum arthritis Maternal Grandmother    Kidney disease Maternal Grandmother    Heart disease Paternal Grandmother    Stroke Paternal Grandmother    Stroke Paternal Grandfather    Heart disease Paternal Grandfather  Allergies  Allergen Reactions   Other Itching    Cat Gut sutures    Oxycodone Hives   Amlodipine Hives   Augmentin [Amoxicillin-Pot Clavulanate] Diarrhea   Azithromycin Hives   Contrast Media [Iodinated Contrast Media] Hives   Doxycycline Diarrhea   Erythromycin Diarrhea   Hydrocodone Nausea Only   Lactose Intolerance (Gi) Diarrhea    Bloating   Minocycline Diarrhea   Nsaids Other (See Comments)    Internal Bleeding   Relafen [Nabumetone] Hives   Sulfa Antibiotics Hives   Synthroid [Levothyroxine] Diarrhea   Tetracyclines & Related Diarrhea   Tilactase    Tramadol Rash    Takes +/- Benadryl    REVIEW OF SYSTEMS (Negative unless checked)   Constitutional: '[]'$ Weight loss  '[]'$ Fever  '[]'$ Chills Cardiac: '[]'$ Chest pain   '[]'$ Chest pressure   '[x]'$ Palpitations   '[]'$ Shortness of breath when laying flat   '[]'$ Shortness of breath at rest   '[]'$ Shortness of breath with exertion. Vascular:  '[x]'$ Pain in legs with walking   '[x]'$ Pain in legs at rest   '[]'$ Pain in legs when laying flat   '[]'$ Claudication   '[]'$ Pain in feet when walking  '[]'$ Pain in feet at rest  '[]'$ Pain in feet when laying flat   '[]'$ History of DVT   '[]'$ Phlebitis   '[]'$ Swelling in legs   '[]'$ Varicose veins   '[]'$ Non-healing ulcers Pulmonary:   '[]'$ Uses home oxygen   '[]'$ Productive cough   '[]'$ Hemoptysis   '[]'$ Wheeze  '[]'$ COPD   '[]'$ Asthma Neurologic:  '[]'$ Dizziness  '[]'$ Blackouts   '[]'$ Seizures   '[x]'$ History of stroke   '[]'$ History of TIA  '[]'$ Aphasia   '[]'$ Temporary blindness   '[]'$ Dysphagia   '[]'$ Weakness or numbness in arms   '[]'$ Weakness or numbness in legs Musculoskeletal:  '[x]'$ Arthritis   '[]'$ Joint swelling   '[]'$ Joint pain   '[]'$ Low back pain Hematologic:  '[]'$ Easy bruising  '[]'$ Easy bleeding    '[]'$ Hypercoagulable state   '[]'$ Anemic   Gastrointestinal:  '[x]'$ Blood in stool   '[]'$ Vomiting blood  '[x]'$ Gastroesophageal reflux/heartburn   '[]'$ Abdominal pain Genitourinary:  '[]'$ Chronic kidney disease   '[]'$ Difficult urination  '[]'$ Frequent urination  '[]'$ Burning with urination   '[]'$ Hematuria Skin:  '[]'$ Rashes   '[]'$ Ulcers   '[]'$ Wounds Psychological:  '[]'$ History of anxiety   '[]'$  History of major depression.  Physical Examination  BP (!) 144/84   Pulse 91   Ht '5\' 3"'$  (1.6 m)   Wt 112 lb (50.8 kg)   BMI 19.84 kg/m  Gen:   NAD, thin and frail appearing Head: [place]/AT, + temporalis wasting. Ear/Nose/Throat: Hearing grossly intact, nares w/o erythema or drainage Eyes: Conjunctiva clear. Sclera non-icteric Neck: Supple.  Trachea midline Pulmonary:  Good air movement, no use of accessory muscles.  Cardiac: RRR, no JVD Vascular:  Vessel Right Left  Radial Palpable Palpable               Musculoskeletal: M/S 5/5 throughout.  No deformity or atrophy. No edema. Neurologic: Sensation grossly intact in extremities.  Symmetrical.  Speech is fluent.  Psychiatric: Judgment intact, Mood & affect appropriate for pt's clinical situation. Dermatologic: No rashes or ulcers noted.  No cellulitis or open wounds.      Labs Recent Results (from the past 2160 hour(s))  [employer] Select 13 (MW), Urine     Status: None   Collection Time: 06/14/22  2:18 PM  Result Value Ref Range   Summary Note     Comment: ==================================================================== Tox[employer] Select 13 (MW) ==================================================================== Test  Result       Flag       Units  Drug Present and Declared for Prescription Verification   Tramadol                       >7042        EXPECTED   ng/mg creat   O-Desmethyltramadol            >7042        EXPECTED   ng/mg creat   N-Desmethyltramadol            >7042        EXPECTED   ng/mg creat    Source of tramadol is a  prescription medication. O-desmethyltramadol    and N-desmethyltramadol are expected metabolites of tramadol.  ==================================================================== Test                      Result    Flag   Units      Ref Range   Creatinine              71               mg/dL      >=20 ==================================================================== Declared Medications:  The flagging and interpretation on this report are based on the  follo wing declared medications.  Unexpected results may arise from  inaccuracies in the declared medications.   **Note: The testing scope of this panel includes these medications:   Tramadol (Ultram)   **Note: The testing scope of this panel does not include the  following reported medications:   Acetaminophen (Tylenol)  Albuterol (Ventolin HFA)  Atenolol (Tenormin)  Benazepril (Lotensin)  Cilostazol (Pletal)  Cyclobenzaprine (Flexeril)  Diphenhydramine (Benadryl)  Fluticasone (Flonase)  Fluticasone (Trelegy)  Gabapentin (Neurontin)  Hydroxyzine (Atarax)  Montelukast (Singulair)  Multivitamin  Niacinamide  Omeprazole (Prilosec)  Supplement  Thyroid: Liothyronine/Levothyroxine ([place])  Topical Lidocaine (Lidoderm)  Umeclidinium (Trelegy)  Vilanterol (Trelegy)  Vitamin B  Vitamin C ==================================================================== For clinical consultation, please call [phone]. ======================================================= =============   TSH     Status: None   Collection Time: 07/21/22  3:22 PM  Result Value Ref Range   TSH 3.880 0.450 - 4.500 uIU/mL  Sed Rate (ESR)     Status: None   Collection Time: 07/21/22  3:22 PM  Result Value Ref Range   Sed Rate 25 0 - 40 mm/hr  I-STAT creatinine     Status: None   Collection Time: 08/25/22  3:34 PM  Result Value Ref Range   Creatinine, Ser 0.80 0.44 - 1.00 mg/dL    Radiology CT ANGIO NECK W OR WO CONTRAST  Result Date:  08/26/2022 CLINICAL DATA:  82 year old female with carotid artery stenosis, beginning to become symptomatic. EXAM: CT ANGIOGRAPHY NECK TECHNIQUE: Multidetector CT imaging of the neck was performed using the standard protocol during bolus administration of intravenous contrast. Multiplanar CT image reconstructions and MIPs were obtained to evaluate the vascular anatomy. Carotid stenosis measurements (when applicable) are obtained utilizing NASCET criteria, using the distal internal carotid diameter as the denominator. RADIATION DOSE REDUCTION: This exam was performed according to the departmental dose-optimization program which includes automated exposure control, adjustment of the mA and/or kV according to patient size and/or use of iterative reconstruction technique. CONTRAST:  36m OMNIPAQUE IOHEXOL 350 MG/ML SOLN COMPARISON:  Chest CT 05/19/2021. FINDINGS: Skeleton: No acute osseous abnormality identified. Cervical spine and TMJ degeneration. Partially visible thoracic scoliosis. Upper chest: Chronic lung  disease with stable 8 mm right lung pulmonary nodule since the previous chest CT, and also prior exam 11/04/2020 (benign, no follow-up imaging recommended). Centrilobular emphysema and chronic right apical lung scarring. No superior mediastinal lymphadenopathy. Other neck: No acute finding in the neck. Right side phthisis bulbi. Grossly negative visible noncontrast brain parenchyma. Aortic arch: Calcified aortic atherosclerosis. 3 vessel arch configuration. Right carotid system: Brachiocephalic artery origin plaque without stenosis. Negative right CCA origin. No plaque proximal to the right carotid bifurcation. But at the bifurcation bulky soft and calcified plaque combine for high-grade origin stenosis approaching a radiographic string sign on series 6, image 107, also series 8, image 107. Calcified plaque continues into the right ICA bulb. Right ICA remains patent to the skull base. Left carotid system: Left  CCA origin plaque without stenosis. No left CCA plaque before the bifurcation. But at the left carotid bifurcation complex mostly soft plaque results in high-grade stenosis at the left ICA origin on series 6, image 111 and series 8, image 106 numerically estimated at 65 % with respect to the distal vessel. Additional left bulb plaque. Left ICA remains patent to the skull base. Beaded appearance of the left ICA distal to the bulb in keeping with fibromuscular dysplasia (series 8, image 101). Mild calcified left ICA plaque below the skull base. Vertebral arteries: Proximal right subclavian artery calcified plaque without stenosis. Calcified plaque at the right vertebral artery origin with mild to moderate stenosis. Right vertebral is patent to the vertebrobasilar junction without additional stenosis. Proximal left subclavian artery atherosclerosis with estimated 50% stenosis before the left vertebral artery origin on series 8, image 99. Left vertebral artery origin is patent with soft plaque and moderate stenosis on series 6, image 209. Left vertebral is codominant and patent to the vertebrobasilar junction without additional stenosis. Limited CTA HEAD Posterior circulation: Patent PICA origins. Visible basilar artery is patent. Anterior circulation: Both visible ICA siphons are patent with at least moderate calcified plaque in the cavernous segments. Anatomic variants: None. Review of the MIP images confirms the above findings IMPRESSION: 1. Positive for bulky atherosclerosis at both carotid bifurcations resulting in: - High-grade Right ICA origin stenosis approaching a RADIOGRAPHIC-STRING-SIGN. - Left ICA origin estimated at 65%. - partially visible ICA siphon calcified plaque. 2. Proximal subclavian artery atherosclerosis greater on the left with estimated 50% stenosis before the Left Vertebral Artery origin. And up to Moderate bilateral Vertebral Artery origin stenosis. 3. Aortic Atherosclerosis (ICD10-I70.0) and  Emphysema (ICD10-J43.9). These results will be called to the ordering clinician or representative by the Radiologist Assistant, and communication documented in the PACS or [employer]. Electronically Signed   By: Genevie Ann M.D.   On: 08/26/2022 12:28    Assessment/Plan HTN (hypertension) blood pressure control important in reducing the progression of atherosclerotic disease. On appropriate oral medications.  Atherosclerosis of native arteries of extremity with intermittent claudication ([place]) To be checked later this year.  Her previous iliac stents may limit our ability to perform carotid stenting depending on what we find at the time of angiogram.  Vision loss Possibly related to carotid disease.  Carotid stenosis she has undergone a CT angiogram of the neck which I have independently reviewed.  This demonstrates a high-grade right carotid artery stenosis which is calcific.  The estimated degree of stenosis on the left was reported at 25% but certainly appears to be more significant than that to me.  These lesions are also relatively high occurring about the level of C2.  This is  significantly worse on the right than the reported duplex with a highly calcific nature of the disease may limit our visualization with duplex. We had a long discussion today about her carotid disease.  In a high-grade lesion that is likely symptomatic from previous embolization to her right eye, this represents a critical situation and revascularization is certainly indicated.  We discussed the differences between carotid endarterectomy and carotid stenting.  Her lesion is quite high at the level of C2 and getting there surgically may be difficult and/or impossible.  The only saving grace from that would be her small frame and then neck.  Nonetheless, angiography may also help [place] further characterize the lesion that has quite disparate findings on CT scan and duplex and if anatomically appropriate, carotid stenting could  be performed concomitantly.  This may also give [place] some better visualization of the left carotid stenosis that appears to be greater than 65% reported to me.  She is agreeable to carotid angiography with potential stenting to be performed in the near future.  I will send her in prescription for Plavix and Lipitor that she should begin several days prior to the procedure.  I discussed the risks and benefits of the procedure.  I also discussed the differences between carotid endarterectomy and carotid stenting.    Leotis Pain, MD  08/30/2022 3:53 PM    This note was created with Dragon medical transcription system.  Any errors from dictation are purely unintentional

## 2022-08-30 NOTE — Assessment & Plan Note (Signed)
To be checked later this year.  Her previous iliac stents may limit our ability to perform carotid stenting depending on what we find at the time of angiogram.

## 2022-08-30 NOTE — Assessment & Plan Note (Signed)
she has undergone a CT angiogram of the neck which I have independently reviewed.  This demonstrates a high-grade right carotid artery stenosis which is calcific.  The estimated degree of stenosis on the left was reported at 38% but certainly appears to be more significant than that to me.  These lesions are also relatively high occurring about the level of C2.  This is significantly worse on the right than the reported duplex with a highly calcific nature of the disease may limit our visualization with duplex. We had a long discussion today about her carotid disease.  In a high-grade lesion that is likely symptomatic from previous embolization to her right eye, this represents a critical situation and revascularization is certainly indicated.  We discussed the differences between carotid endarterectomy and carotid stenting.  Her lesion is quite high at the level of C2 and getting there surgically may be difficult and/or impossible.  The only saving grace from that would be her small frame and then neck.  Nonetheless, angiography may also help [place] further characterize the lesion that has quite disparate findings on CT scan and duplex and if anatomically appropriate, carotid stenting could be performed concomitantly.  This may also give [place] some better visualization of the left carotid stenosis that appears to be greater than 65% reported to me.  She is agreeable to carotid angiography with potential stenting to be performed in the near future.  I will send her in prescription for Plavix and Lipitor that she should begin several days prior to the procedure.  I discussed the risks and benefits of the procedure.  I also discussed the differences between carotid endarterectomy and carotid stenting.

## 2022-09-02 ENCOUNTER — Ambulatory Visit: Payer: TRICARE For Life (TFL) | Admitting: Family Medicine

## 2022-09-08 ENCOUNTER — Telehealth (INDEPENDENT_AMBULATORY_CARE_PROVIDER_SITE_OTHER): Payer: Self-pay

## 2022-09-08 NOTE — Telephone Encounter (Signed)
Spoke with the patient and she is scheduled with Dr. Lucky Cowboy on 09/19/22 with [facility] 6:45 [facility] for a right carotid stent placement. Patient was initially offered 09/12/22 and declined stating she had an appt on that day. Pre-procedure instructions were discussed and will be mailed.

## 2022-09-12 ENCOUNTER — Ambulatory Visit: Payer: TRICARE For Life (TFL) | Admitting: Family Medicine

## 2022-09-15 ENCOUNTER — Ambulatory Visit: Payer: Medicare Other | Admitting: Family Medicine

## 2022-09-16 ENCOUNTER — Other Ambulatory Visit: Payer: Self-pay | Admitting: Family Medicine

## 2022-09-16 NOTE — Telephone Encounter (Signed)
Requested Prescriptions  Pending Prescriptions Disp Refills   omeprazole (PRILOSEC) 20 MG capsule [Pharmacy Med Name: OMEPRAZOLE DR 20 MG CAPSULE] 90 capsule 0    Sig: Take 1 capsule (20 mg total) by mouth daily.     Gastroenterology: Proton Pump Inhibitors Passed - 09/16/2022  3:26 PM      Passed - Valid encounter within last 12 months    Recent Outpatient Visits           1 month ago Hypothyroidism, unspecified type   [place], [place], DO   3 months ago Hypothyroidism, unspecified type   [place] , Megan P, DO   6 months ago Acute non-recurrent frontal sinusitis   [place], NP   11 months ago Essential hypertension   [place], [place], DO   1 year ago COPD exacerbation [facility])   [place], Barb Merino, DO       Future Appointments             In 1 week Wynetta Emery, Barb Merino, DO [place]

## 2022-09-17 ENCOUNTER — Other Ambulatory Visit: Payer: Self-pay | Admitting: Family Medicine

## 2022-09-19 ENCOUNTER — Encounter: Payer: Self-pay | Admitting: Vascular Surgery

## 2022-09-19 ENCOUNTER — Other Ambulatory Visit: Payer: Self-pay

## 2022-09-19 ENCOUNTER — Encounter
Admission: RE | Disposition: A | Discharge status: Home / Self Care | Payer: Self-pay | Source: Home / Self Care | Attending: Vascular Surgery

## 2022-09-19 ENCOUNTER — Inpatient Hospital Stay
Admission: RE | Admit: 2022-09-19 | Discharge: 2022-09-20 | DRG: 036 | Disposition: A | Discharge status: Home / Self Care | Payer: Medicare Other | Attending: Vascular Surgery | Admitting: Vascular Surgery

## 2022-09-19 DIAGNOSIS — Z95820 Peripheral vascular angioplasty status with implants and grafts: Secondary | ICD-10-CM

## 2022-09-19 DIAGNOSIS — K219 Gastro-esophageal reflux disease without esophagitis: Secondary | ICD-10-CM | POA: Diagnosis present

## 2022-09-19 DIAGNOSIS — Z880 Allergy status to penicillin: Secondary | ICD-10-CM

## 2022-09-19 DIAGNOSIS — Z8261 Family history of arthritis: Secondary | ICD-10-CM | POA: Diagnosis not present

## 2022-09-19 DIAGNOSIS — Z91041 Radiographic dye allergy status: Secondary | ICD-10-CM

## 2022-09-19 DIAGNOSIS — Z823 Family history of stroke: Secondary | ICD-10-CM

## 2022-09-19 DIAGNOSIS — Z006 Encounter for examination for normal comparison and control in clinical research program: Secondary | ICD-10-CM

## 2022-09-19 DIAGNOSIS — F17210 Nicotine dependence, cigarettes, uncomplicated: Secondary | ICD-10-CM | POA: Diagnosis present

## 2022-09-19 DIAGNOSIS — I10 Essential (primary) hypertension: Secondary | ICD-10-CM | POA: Diagnosis present

## 2022-09-19 DIAGNOSIS — I6521 Occlusion and stenosis of right carotid artery: Secondary | ICD-10-CM | POA: Diagnosis present

## 2022-09-19 DIAGNOSIS — J441 Chronic obstructive pulmonary disease with (acute) exacerbation: Secondary | ICD-10-CM

## 2022-09-19 DIAGNOSIS — I70219 Atherosclerosis of native arteries of extremities with intermittent claudication, unspecified extremity: Secondary | ICD-10-CM | POA: Diagnosis present

## 2022-09-19 DIAGNOSIS — Z7902 Long term (current) use of antithrombotics/antiplatelets: Secondary | ICD-10-CM

## 2022-09-19 DIAGNOSIS — M4800 Spinal stenosis, site unspecified: Secondary | ICD-10-CM | POA: Diagnosis present

## 2022-09-19 DIAGNOSIS — J449 Chronic obstructive pulmonary disease, unspecified: Secondary | ICD-10-CM | POA: Diagnosis present

## 2022-09-19 DIAGNOSIS — H5461 Unqualified visual loss, right eye, normal vision left eye: Secondary | ICD-10-CM | POA: Diagnosis present

## 2022-09-19 DIAGNOSIS — Z8249 Family history of ischemic heart disease and other diseases of the circulatory system: Secondary | ICD-10-CM | POA: Diagnosis not present

## 2022-09-19 DIAGNOSIS — Z888 Allergy status to other drugs, medicaments and biological substances status: Secondary | ICD-10-CM

## 2022-09-19 DIAGNOSIS — Z818 Family history of other mental and behavioral disorders: Secondary | ICD-10-CM

## 2022-09-19 DIAGNOSIS — Z885 Allergy status to narcotic agent status: Secondary | ICD-10-CM

## 2022-09-19 DIAGNOSIS — Z8673 Personal history of transient ischemic attack (TIA), and cerebral infarction without residual deficits: Secondary | ICD-10-CM

## 2022-09-19 DIAGNOSIS — Z79899 Other long term (current) drug therapy: Secondary | ICD-10-CM | POA: Diagnosis not present

## 2022-09-19 DIAGNOSIS — Z881 Allergy status to other antibiotic agents status: Secondary | ICD-10-CM | POA: Diagnosis not present

## 2022-09-19 DIAGNOSIS — M419 Scoliosis, unspecified: Secondary | ICD-10-CM | POA: Diagnosis present

## 2022-09-19 DIAGNOSIS — I6523 Occlusion and stenosis of bilateral carotid arteries: Secondary | ICD-10-CM | POA: Diagnosis present

## 2022-09-19 DIAGNOSIS — Z8674 Personal history of sudden cardiac arrest: Secondary | ICD-10-CM | POA: Diagnosis not present

## 2022-09-19 DIAGNOSIS — Z886 Allergy status to analgesic agent status: Secondary | ICD-10-CM | POA: Diagnosis not present

## 2022-09-19 HISTORY — PX: CAROTID PTA/STENT INTERVENTION: CATH118231

## 2022-09-19 LAB — CREATININE, SERUM
Creatinine, Ser: 0.81 mg/dL (ref 0.44–1.00)
GFR, Estimated: >60 mL/min (ref >60)

## 2022-09-19 LAB — MRSA NEXT GEN BY PCR, NASAL: MRSA by PCR Next Gen: NOT DETECTED

## 2022-09-19 LAB — BUN: BUN: 15 mg/dL (ref 8–23)

## 2022-09-19 LAB — GLUCOSE, CAPILLARY: Glucose-Capillary: 148 mg/dL — ABNORMAL HIGH (ref 70–99)

## 2022-09-19 SURGERY — CAROTID PTA/STENT INTERVENTION
Anesthesia: Moderate Sedation | Laterality: Right

## 2022-09-19 MED ORDER — ONDANSETRON HCL 4 MG/2ML IJ SOLN: 4.0000 mg | Freq: Four times a day (QID) | INTRAMUSCULAR | Status: DC | PRN

## 2022-09-19 MED ORDER — THYROID 60 MG PO TABS: 90.0000 mg | ORAL_TABLET | ORAL | Status: DC

## 2022-09-19 MED ORDER — POTASSIUM CHLORIDE CRYS ER 20 MEQ PO TBCR: 20.0000 meq | EXTENDED_RELEASE_TABLET | Freq: Every day | ORAL | Status: DC | PRN

## 2022-09-19 MED ORDER — DIPHENHYDRAMINE HCL 50 MG/ML IJ SOLN
INTRAMUSCULAR | Status: AC
  Filled 2022-09-19: qty 1

## 2022-09-19 MED ORDER — NIACINAMIDE 100 MG PO TABS: 100.0000 mg | ORAL_TABLET | Freq: Two times a day (BID) | ORAL | Status: DC

## 2022-09-19 MED ORDER — GABAPENTIN 300 MG PO CAPS
300.0000 mg | ORAL_CAPSULE | Freq: Two times a day (BID) | ORAL | Status: DC
  Administered 2022-09-19: 300 mg via ORAL
  Filled 2022-09-19: qty 1

## 2022-09-19 MED ORDER — DIPHENHYDRAMINE HCL 50 MG/ML IJ SOLN: 50.0000 mg | Freq: Once | INTRAMUSCULAR | Status: DC | PRN

## 2022-09-19 MED ORDER — HYDRALAZINE HCL 20 MG/ML IJ SOLN: 5.0000 mg | INTRAMUSCULAR | Status: DC | PRN

## 2022-09-19 MED ORDER — FENTANYL CITRATE (PF) 100 MCG/2ML IJ SOLN
INTRAMUSCULAR | Status: DC | PRN
  Administered 2022-09-19: 25 ug via INTRAVENOUS

## 2022-09-19 MED ORDER — VANCOMYCIN HCL IN DEXTROSE 1-5 GM/200ML-% IV SOLN: 1000.0000 mg | INTRAVENOUS | Status: AC

## 2022-09-19 MED ORDER — MIDAZOLAM HCL 2 MG/2ML IJ SOLN
INTRAMUSCULAR | Status: DC | PRN
  Administered 2022-09-19: 1 mg via INTRAVENOUS

## 2022-09-19 MED ORDER — MIDAZOLAM HCL 2 MG/2ML IJ SOLN
INTRAMUSCULAR | Status: AC
  Filled 2022-09-19: qty 2

## 2022-09-19 MED ORDER — PANTOPRAZOLE SODIUM 40 MG PO TBEC
40.0000 mg | DELAYED_RELEASE_TABLET | Freq: Every day | ORAL | Status: DC
  Administered 2022-09-20: 40 mg via ORAL
  Filled 2022-09-19: qty 1

## 2022-09-19 MED ORDER — HYDROMORPHONE HCL 1 MG/ML IJ SOLN
1.0000 mg | Freq: Once | INTRAMUSCULAR | Status: AC | PRN
  Administered 2022-09-20: 1 mg via INTRAVENOUS
  Filled 2022-09-19: qty 1

## 2022-09-19 MED ORDER — LABETALOL HCL 5 MG/ML IV SOLN: 10.0000 mg | INTRAVENOUS | Status: DC | PRN

## 2022-09-19 MED ORDER — FAMOTIDINE IN NACL 20-0.9 MG/50ML-% IV SOLN
20.0000 mg | Freq: Two times a day (BID) | INTRAVENOUS | Status: DC
  Filled 2022-09-19: qty 50

## 2022-09-19 MED ORDER — METHYLPREDNISOLONE SODIUM SUCC 125 MG IJ SOLR: 125.0000 mg | Freq: Once | INTRAMUSCULAR | Status: AC | PRN

## 2022-09-19 MED ORDER — MONTELUKAST SODIUM 10 MG PO TABS
10.0000 mg | ORAL_TABLET | Freq: Every day | ORAL | Status: DC
  Administered 2022-09-19: 10 mg via ORAL
  Filled 2022-09-19: qty 1

## 2022-09-19 MED ORDER — TICAGRELOR 90 MG PO TABS
90.0000 mg | ORAL_TABLET | Freq: Two times a day (BID) | ORAL | Status: DC
  Administered 2022-09-19 – 2022-09-20 (×3): 90 mg via ORAL
  Filled 2022-09-19 (×3): qty 1

## 2022-09-19 MED ORDER — ATENOLOL 25 MG PO TABS
25.0000 mg | ORAL_TABLET | Freq: Every day | ORAL | Status: DC
  Administered 2022-09-20: 25 mg via ORAL
  Filled 2022-09-19: qty 1

## 2022-09-19 MED ORDER — FLUTICASONE FUROATE-VILANTEROL 100-25 MCG/ACT IN AEPB: 1.0000 | INHALATION_SPRAY | Freq: Every day | RESPIRATORY_TRACT | Status: DC

## 2022-09-19 MED ORDER — PHENYLEPHRINE 80 MCG/ML (10ML) SYRINGE FOR IV PUSH (FOR BLOOD PRESSURE SUPPORT)
PREFILLED_SYRINGE | INTRAVENOUS | Status: AC
  Filled 2022-09-19: qty 10

## 2022-09-19 MED ORDER — ORAL CARE MOUTH RINSE: 15.0000 mL | OROMUCOSAL | Status: DC | PRN

## 2022-09-19 MED ORDER — IODIXANOL 320 MG/ML IV SOLN
INTRAVENOUS | Status: DC | PRN
  Administered 2022-09-19: 65 mL

## 2022-09-19 MED ORDER — METHYLPREDNISOLONE SODIUM SUCC 125 MG IJ SOLR
INTRAMUSCULAR | Status: AC
  Administered 2022-09-19: 125 mg via INTRAVENOUS
  Filled 2022-09-19: qty 2

## 2022-09-19 MED ORDER — ATORVASTATIN CALCIUM 10 MG PO TABS: 10.0000 mg | ORAL_TABLET | Freq: Every evening | ORAL | Status: DC

## 2022-09-19 MED ORDER — SODIUM CHLORIDE 0.9 % IV SOLN: 500.0000 mL | Freq: Once | INTRAVENOUS | Status: DC | PRN

## 2022-09-19 MED ORDER — SODIUM CHLORIDE 0.9 % IV SOLN: INTRAVENOUS | Status: DC

## 2022-09-19 MED ORDER — CLOPIDOGREL BISULFATE 75 MG PO TABS: 75.0000 mg | ORAL_TABLET | Freq: Every day | ORAL | Status: DC

## 2022-09-19 MED ORDER — ACETAMINOPHEN 500 MG PO TABS: 500.0000 mg | ORAL_TABLET | Freq: Four times a day (QID) | ORAL | Status: DC | PRN

## 2022-09-19 MED ORDER — HEPARIN SODIUM (PORCINE) 1000 UNIT/ML IJ SOLN
INTRAMUSCULAR | Status: AC
  Filled 2022-09-19: qty 10

## 2022-09-19 MED ORDER — CYCLOBENZAPRINE HCL 5 MG PO TABS: 5.0000 mg | ORAL_TABLET | Freq: Three times a day (TID) | ORAL | Status: DC | PRN

## 2022-09-19 MED ORDER — GUAIFENESIN-DM 100-10 MG/5ML PO SYRP: 15.0000 mL | ORAL_SOLUTION | ORAL | Status: DC | PRN

## 2022-09-19 MED ORDER — MORPHINE SULFATE (PF) 4 MG/ML IV SOLN: 2.0000 mg | INTRAVENOUS | Status: DC | PRN

## 2022-09-19 MED ORDER — ROSUVASTATIN CALCIUM 10 MG PO TABS
5.0000 mg | ORAL_TABLET | Freq: Every evening | ORAL | Status: DC
  Administered 2022-09-19: 5 mg via ORAL
  Filled 2022-09-19: qty 1

## 2022-09-19 MED ORDER — ASPIRIN 81 MG PO TBEC
81.0000 mg | DELAYED_RELEASE_TABLET | Freq: Every day | ORAL | Status: DC
  Administered 2022-09-20: 81 mg via ORAL
  Filled 2022-09-19: qty 1

## 2022-09-19 MED ORDER — ADULT MULTIVITAMIN W/MINERALS CH
1.0000 | ORAL_TABLET | Freq: Every day | ORAL | Status: DC
  Administered 2022-09-20: 1 via ORAL
  Filled 2022-09-19: qty 1

## 2022-09-19 MED ORDER — MIDAZOLAM HCL 2 MG/ML PO SYRP: 8.0000 mg | ORAL_SOLUTION | Freq: Once | ORAL | Status: DC | PRN

## 2022-09-19 MED ORDER — MAGNESIUM SULFATE 2 GM/50ML IV SOLN: 2.0000 g | Freq: Every day | INTRAVENOUS | Status: DC | PRN

## 2022-09-19 MED ORDER — ACETAMINOPHEN 325 MG RE SUPP: 325.0000 mg | RECTAL | Status: DC | PRN

## 2022-09-19 MED ORDER — DIPHENHYDRAMINE HCL 50 MG/ML IJ SOLN
INTRAMUSCULAR | Status: DC | PRN
  Administered 2022-09-19: 25 mg via INTRAVENOUS

## 2022-09-19 MED ORDER — DOPAMINE-DEXTROSE 3.2-5 MG/ML-% IV SOLN
INTRAVENOUS | Status: AC
  Filled 2022-09-19: qty 250

## 2022-09-19 MED ORDER — CILOSTAZOL 100 MG PO TABS
100.0000 mg | ORAL_TABLET | Freq: Two times a day (BID) | ORAL | Status: DC
  Administered 2022-09-19 – 2022-09-20 (×2): 100 mg via ORAL
  Filled 2022-09-19 (×2): qty 1

## 2022-09-19 MED ORDER — VANCOMYCIN HCL IN DEXTROSE 1-5 GM/200ML-% IV SOLN
1000.0000 mg | Freq: Two times a day (BID) | INTRAVENOUS | Status: DC
  Filled 2022-09-19 (×2): qty 200

## 2022-09-19 MED ORDER — HYDROXYZINE HCL 25 MG PO TABS: 25.0000 mg | ORAL_TABLET | Freq: Three times a day (TID) | ORAL | Status: DC | PRN

## 2022-09-19 MED ORDER — FAMOTIDINE 20 MG PO TABS
ORAL_TABLET | ORAL | Status: AC
  Administered 2022-09-19: 40 mg via ORAL
  Filled 2022-09-19: qty 2

## 2022-09-19 MED ORDER — PHENOL 1.4 % MT LIQD: 1.0000 | OROMUCOSAL | Status: DC | PRN

## 2022-09-19 MED ORDER — PHENYLEPHRINE HCL-NACL 20-0.9 MG/250ML-% IV SOLN
INTRAVENOUS | Status: AC
  Filled 2022-09-19: qty 250

## 2022-09-19 MED ORDER — PHENYLEPHRINE HCL (PRESSORS) 10 MG/ML IV SOLN
INTRAVENOUS | Status: AC
  Filled 2022-09-19: qty 1

## 2022-09-19 MED ORDER — FLUTICASONE PROPIONATE 50 MCG/ACT NA SUSP: 2.0000 | Freq: Every day | NASAL | Status: DC | PRN

## 2022-09-19 MED ORDER — DIPHENHYDRAMINE HCL 25 MG PO TABS: 25.0000 mg | ORAL_TABLET | Freq: Four times a day (QID) | ORAL | Status: DC | PRN

## 2022-09-19 MED ORDER — HEPARIN SODIUM (PORCINE) 1000 UNIT/ML IJ SOLN
INTRAMUSCULAR | Status: DC | PRN
  Administered 2022-09-19: 7000 [IU] via INTRAVENOUS

## 2022-09-19 MED ORDER — UMECLIDINIUM BROMIDE 62.5 MCG/ACT IN AEPB: 1.0000 | INHALATION_SPRAY | Freq: Every day | RESPIRATORY_TRACT | Status: DC

## 2022-09-19 MED ORDER — VANCOMYCIN HCL IN DEXTROSE 1-5 GM/200ML-% IV SOLN
INTRAVENOUS | Status: AC
  Administered 2022-09-19: 1000 mg via INTRAVENOUS
  Filled 2022-09-19: qty 200

## 2022-09-19 MED ORDER — FAMOTIDINE 20 MG PO TABS: 40.0000 mg | ORAL_TABLET | Freq: Once | ORAL | Status: AC | PRN

## 2022-09-19 MED ORDER — BENAZEPRIL HCL 10 MG PO TABS
10.0000 mg | ORAL_TABLET | Freq: Every day | ORAL | Status: DC
  Administered 2022-09-20: 10 mg via ORAL
  Filled 2022-09-19: qty 1

## 2022-09-19 MED ORDER — FENTANYL CITRATE (PF) 100 MCG/2ML IJ SOLN
INTRAMUSCULAR | Status: AC
  Filled 2022-09-19: qty 2

## 2022-09-19 MED ORDER — ALBUTEROL SULFATE (2.5 MG/3ML) 0.083% IN NEBU
2.5000 mg | INHALATION_SOLUTION | Freq: Once | RESPIRATORY_TRACT | Status: DC
  Filled 2022-09-19: qty 3

## 2022-09-19 MED ORDER — ALUM & MAG HYDROXIDE-SIMETH 200-200-20 MG/5ML PO SUSP: 15.0000 mL | ORAL | Status: DC | PRN

## 2022-09-19 MED ORDER — METOPROLOL TARTRATE 5 MG/5ML IV SOLN: 2.0000 mg | INTRAVENOUS | Status: DC | PRN

## 2022-09-19 MED ORDER — ATROPINE SULFATE 1 MG/10ML IJ SOSY
PREFILLED_SYRINGE | INTRAMUSCULAR | Status: AC
  Filled 2022-09-19: qty 10

## 2022-09-19 MED ORDER — B COMPLEX-C PO TABS
1.0000 | ORAL_TABLET | Freq: Every day | ORAL | Status: DC
  Administered 2022-09-20: 1 via ORAL
  Filled 2022-09-19: qty 1

## 2022-09-19 MED ORDER — ACETAMINOPHEN 325 MG PO TABS: 325.0000 mg | ORAL_TABLET | ORAL | Status: DC | PRN

## 2022-09-19 MED ORDER — ALBUTEROL SULFATE (2.5 MG/3ML) 0.083% IN NEBU: 3.0000 mL | INHALATION_SOLUTION | Freq: Four times a day (QID) | RESPIRATORY_TRACT | Status: DC | PRN

## 2022-09-19 SURGICAL SUPPLY — 20 items
BALLN VIATRAC 5X20X135 (BALLOONS) ×1
BALLOON VIATRAC 5X20X135 (BALLOONS) IMPLANT
CATH ANGIO 5F PIGTAIL 100CM (CATHETERS) IMPLANT
CATH BEACON 5 .035 100 H1 TIP (CATHETERS) IMPLANT
COVER DRAPE FLUORO 36X44 (DRAPES) IMPLANT
COVER PROBE ULTRASOUND 5X96 (MISCELLANEOUS) IMPLANT
DEVICE EMBOSHIELD NAV6 4.0-7.0 (FILTER) IMPLANT
DEVICE SAFEGUARD 24CM (GAUZE/BANDAGES/DRESSINGS) IMPLANT
DEVICE STARCLOSE SE CLOSURE (Vascular Products) IMPLANT
DEVICE TORQUE .025-.038 (MISCELLANEOUS) IMPLANT
GLIDEWIRE ANGLED SS 035X260CM (WIRE) IMPLANT
GUIDEWIRE VASC STIFF .038X260 (WIRE) IMPLANT
KIT CAROTID MANIFOLD (MISCELLANEOUS) IMPLANT
KIT ENCORE 26 ADVANTAGE (KITS) IMPLANT
PACK ANGIOGRAPHY (CUSTOM PROCEDURE TRAY) ×1 IMPLANT
SHEATH BRITE TIP 5FRX11 (SHEATH) IMPLANT
SHEATH SHUTTLE 6FRX80 (SHEATH) IMPLANT
STENT XACT CAR 9-7X30X136 (Permanent Stent) IMPLANT
SYR MEDRAD MARK 7 150ML (SYRINGE) IMPLANT
WIRE GUIDERIGHT .035X150 (WIRE) IMPLANT

## 2022-09-19 NOTE — Progress Notes (Signed)
Sent Message to Dr. Lucky Cowboy. Patient states that she has diarrhea if she takes Plavix and that Lipitor causes her to have pain. Explained the importance of the two medications. Per Dr. Lucky Cowboy- ask pharmacy to explain the risks and benefits, may need to change to Brilinta per Dr. Lucky Cowboy but patient requesting more information. Pharmacy and Dr. Lucky Cowboy made aware. Continue to assess.

## 2022-09-19 NOTE — Progress Notes (Signed)
This RT attempted to give pt. A breathing tx, pt. Refused RN aware

## 2022-09-19 NOTE — Op Note (Signed)
OPERATIVE NOTE DATE: 09/19/2022  PROCEDURE:  Ultrasound guidance for vascular access right femoral artery  Placement of a 9 mm proximal, 7 mm distal, 3 cm long Exact stent with the use of the NAV-6 embolic protection device in the right carotid artery  PRE-OPERATIVE DIAGNOSIS: 1.  Right carotid artery stenosis. 2.  Previous right retinal stroke  POST-OPERATIVE DIAGNOSIS:  Same as above  SURGEON: Leotis Pain, MD  ASSISTANT(S): None  ANESTHESIA: local/MCS  ESTIMATED BLOOD LOSS: 20 cc  CONTRAST: 65 cc  FLUORO TIME: 2.8 minutes  MODERATE CONSCIOUS SEDATION TIME:  Approximately 31 minutes using 1 mg of Versed and 25 mcg of Fentanyl  FINDING(S): 1.   Approximately 70% irregular right carotid artery stenosis with a napkin ring like lesion at the distal common carotid artery extending into the origins of the external and internal carotid arteries  SPECIMEN(S):   none  INDICATIONS:   Patient is a 82 y.o. female who presents with previous right retinal stroke and significant right carotid artery stenosis.  The patient has a relatively high lesion that would be difficult to access surgically and carotid artery stenting was felt to be preferred to endarterectomy for that reason.  Risks and benefits were discussed and informed consent was obtained.   DESCRIPTION: After obtaining full informed written consent, the patient was brought back to the vascular suite and placed supine upon the table.  The patient received IV antibiotics prior to induction. Moderate conscious sedation was administered during a face to face encounter with the patient throughout the procedure with my supervision of the RN administering medicines and monitoring the patients vital signs and mental status throughout from the start of the procedure until the patient was taken to the recovery room.  After obtaining adequate anesthesia, the patient was prepped and draped in the standard fashion.   The right femoral artery was  visualized with ultrasound and found to be widely patent. It was then accessed under direct ultrasound guidance without difficulty with a Seldinger needle. A permanent image was recorded. A J-wire was placed and we then placed a 6 French sheath. The patient was then heparinized and a total of 7000 units of intravenous heparin were given and an ACT was checked to confirm successful anticoagulation. A pigtail catheter was then placed into the ascending aorta. This showed a type IIb aortic arch without proximal stenosis and the great vessels. I then selectively cannulated the innominate artery without difficulty with a headhunter catheter and advanced into the mid right common carotid artery.  Cervical and cerebral carotid angiography was then performed. There were no obvious intracranial filling defects with some cross-filling right to left.  She has a known significant left carotid artery stenosis as well. The carotid bifurcation demonstrated an approximately 70% irregular calcific carotid artery stenosis.  This is somewhat of a napkin ring like lesion in the distal common carotid artery extending into the origin of the internal and external carotid arteries.  I then advanced into the external carotid artery with a Glidewire and the headhunter catheter and then exchanged for the Amplatz Super Stiff wire. Over the Amplatz Super Stiff wire, a 6 [place] shuttle sheath was placed into the mid common carotid artery. I then used the NAV-6  Embolic protection device and crossed the lesion and parked this in the distal internal carotid artery at the base of the skull.  I then selected a 9 mm proximal, 7 mm distal, 3 cm long exact stent. This was deployed across the lesion  encompassing it in its entirety. A 5 mm diameter by 2 cm length balloon was used to post dilate the stent. Only about a 20% residual stenosis was present after angioplasty. Completion angiogram showed normal intracranial filling without new defects. At this  point I elected to terminate the procedure. The sheath was removed and StarClose closure device was deployed in the right femoral artery with excellent hemostatic result. The patient was taken to the recovery room in stable condition having tolerated the procedure well.  COMPLICATIONS: none  CONDITION: stable  Leotis Pain 09/19/2022 8:57 AM   This note was created with Dragon Medical transcription system. Any errors in dictation are purely unintentional.

## 2022-09-19 NOTE — Telephone Encounter (Signed)
Requested medication (s) are due for refill today: yes  Requested medication (s) are on the active medication list: yes  Last refill:  09/16/22  Future visit scheduled: yes  Notes to clinic:  Pharmacy comment: PLEASE ADVISE -LEVEL ONE INTERACTION WITH CURRENT MED CILOSTAZOL ?? OMEPRAZOLE WAS PREVIOUSLY D/CED DUE TO THIS SAME INTERACTION??? PREV D/C SENT IN NEW AGAIN 2/2--DR WCB LEVEL 1 INTERACTION WITH CILOS.      Requested Prescriptions  Pending Prescriptions Disp Refills   omeprazole (PRILOSEC) 20 MG capsule [Pharmacy Med Name: OMEPRAZOLE DR 20 MG CAPSULE] 90 capsule 0    Sig: Take 1 capsule (20 mg total) by mouth daily.     Gastroenterology: Proton Pump Inhibitors Passed - 09/17/2022  1:54 PM      Passed - Valid encounter within last 12 months    Recent Outpatient Visits           2 months ago Hypothyroidism, unspecified type   [place], DO   3 months ago Hypothyroidism, unspecified type   [place], Megan P, DO   6 months ago Acute non-recurrent frontal sinusitis   [place], NP   11 months ago Essential hypertension   [place], [place], DO   1 year ago COPD exacerbation [facility])   [place], Barb Merino, DO       Future Appointments             In 1 week Wynetta Emery, Barb Merino, DO [place], [facility]

## 2022-09-19 NOTE — Interval H&P Note (Signed)
History and Physical Interval Note:  09/19/2022 8:04 AM  Savannah Mcclure  has presented today for surgery, with the diagnosis of R Carotid Stent   ABBOTT    Carotid artery stenosis.  The various methods of treatment have been discussed with the patient and family. After consideration of risks, benefits and other options for treatment, the patient has consented to  Procedure(s): CAROTID PTA/STENT INTERVENTION (Right) as a surgical intervention.  The patient's history has been reviewed, patient examined, no change in status, stable for surgery.  I have reviewed the patient's chart and labs.  Questions were answered to the patient's satisfaction.     Leotis Pain

## 2022-09-19 NOTE — Progress Notes (Signed)
I had a long conversation with the patient regarding taking either the plavix or brillinta with asa everyday. I explained in detail the importance of taking the medication now that she has had a stent placed in her right carotid artery. I discussed in detail the risk of not taking it and the complications that can occur when not taking the medication such as a stroke, heart attack or even death. She verbalized her understanding. We discussed many of her other medications she takes as well. She verbalizes most medications giver her muscle pain or diarrhea. I again emphasized she must take both daily. Pharmacy has given her reading material on brilinta.

## 2022-09-20 ENCOUNTER — Encounter: Payer: Self-pay | Admitting: Vascular Surgery

## 2022-09-20 LAB — CBC
HCT: 33.2 % — ABNORMAL LOW (ref 36.0–46.0)
Hemoglobin: 11 g/dL — ABNORMAL LOW (ref 12.0–15.0)
MCH: 30.3 pg (ref 26.0–34.0)
MCHC: 33.1 g/dL (ref 30.0–36.0)
MCV: 91.5 fL (ref 80.0–100.0)
Platelets: 370 10*3/uL (ref 150–400)
RBC: 3.63 MIL/uL — ABNORMAL LOW (ref 3.87–5.11)
RDW: 14 % (ref 11.5–15.5)
WBC: 23.8 10*3/uL — ABNORMAL HIGH (ref 4.0–10.5)
nRBC: 0 % (ref 0.0–0.2)

## 2022-09-20 LAB — BASIC METABOLIC PANEL
Anion gap: 9 (ref 5–15)
BUN: 14 mg/dL (ref 8–23)
CO2: 21 mmol/L — ABNORMAL LOW (ref 22–32)
Calcium: 8.3 mg/dL — ABNORMAL LOW (ref 8.9–10.3)
Chloride: 108 mmol/L (ref 98–111)
Creatinine, Ser: 0.78 mg/dL (ref 0.44–1.00)
GFR, Estimated: >60 mL/min (ref >60)
Glucose, Bld: 164 mg/dL — ABNORMAL HIGH (ref 70–99)
Potassium: 3.6 mmol/L (ref 3.5–5.1)
Sodium: 138 mmol/L (ref 135–145)

## 2022-09-20 MED ORDER — TICAGRELOR 90 MG PO TABS: 90.0000 mg | ORAL_TABLET | Freq: Two times a day (BID) | ORAL | 3 refills | Status: DC

## 2022-09-20 MED ORDER — ASPIRIN 81 MG PO TBEC: 81.0000 mg | DELAYED_RELEASE_TABLET | Freq: Every day | ORAL | 12 refills | Status: DC

## 2022-09-20 NOTE — Discharge Summary (Signed)
[facility]    Discharge Summary    Patient ID:  Savannah Mcclure MRN: [id number] DOB/AGE: Aug 07, 1941 82 y.o.  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    COPD (chronic obstructive pulmonary disease) (HCC)    Delirium 03/18/2012   Dyspnea    GERD (gastroesophageal reflux disease)    Heart murmur    Hypertension    Leaky heart valve    Oxygen deficit    uses O2 at night 2 liters   Peripheral vascular disease ([place])    Scoliosis    Sepsis ([place])    Shock ([place]) 03/23/2012   SIRS (systemic inflammatory response syndrome) ([place]) 03/23/2012   Spinal stenosis    Stroke [facility])    Eye   Tachycardia     Procedure(s): CAROTID PTA/STENT INTERVENTION  Discharged Condition: good  HPI:  Savannah Mcclure is an 82 year old female now status post right carotid stent placement.  She is recovering as expected.  No complaints overnight.  Vitals all remained stable and are in within normal limits.  Patient is discharged on aspirin 81 mg daily, Brilinta 90 mg twice a day, and on her Lipitor 10 mg once a day.  Patient claims to be allergic to 19 medications and her history therefore I went over the importance on discharge this morning of taking the aspirin, Brilinta, and statin in order to prevent her from having a life ending stroke heart attack or death.  Patient verbalized her  understanding.  Patient will be discharged today.  Hospital Course:  Savannah Mcclure is a 82 y.o. female is S/P Right Extubated: POD # 0 Physical Exam:  Alert notes x3, no acute distress Face: Symmetrical.  Tongue is midline. Neck: Trachea is midline.  No swelling or bruising. Cardiovascular: Regular rate and rhythm Pulmonary: Clear to auscultation bilaterally Abdomen: Soft, nontender, nondistended Right groin access: Clean dry and intact.  No swelling or drainage noted Left groin access: Clean dry and intact.  No swelling or drainage noted Left lower extremity: Thigh soft.  Calf soft.  Extremities warm distally toes.  Hard to palpate pedal pulses however the foot is warm is her good capillary refill. Right lower extremity: Thigh soft.  Calf soft.  Extremities warm distally toes.  Hard to palpate pedal pulses however the foot is warm is her good capillary refill. Neurological: No deficits noted   Post-op wounds:  clean, dry, intact or healing well  Pt. Ambulating, voiding and taking PO diet without difficulty. Pt pain controlled with PO pain meds.  Labs:  As below  Complications: none  Consults:    Significant Diagnostic Studies: CBC Lab Results  Component Value Date   WBC 23.8 (H) 09/20/2022   HGB 11.0 (L) 09/20/2022   HCT 33.2 (L) 09/20/2022   MCV 91.5 09/20/2022   PLT 370 09/20/2022    BMET    Component Value  Date/Time   NA 138 09/20/2022 0528   NA 141 05/31/2022 1517   NA 132 (L) 09/12/2012 1540   K 3.6 09/20/2022 0528   K 3.8 09/11/2012 0401   CL 108 09/20/2022 0528   CL 99 09/11/2012 0401   CO2 21 (L) 09/20/2022 0528   CO2 24 09/11/2012 0401   GLUCOSE 164 (H) 09/20/2022 0528   GLUCOSE 262 (H) 09/11/2012 0401   BUN 14 09/20/2022 0528   BUN 10 05/31/2022 1517   BUN 22 (H) 09/11/2012 0401   CREATININE 0.78 09/20/2022 0528   CREATININE 0.86 09/11/2012 0401   CALCIUM 8.3 (L) 09/20/2022 0528   CALCIUM 8.5 09/11/2012 0401   GFRNONAA >60  09/20/2022 0528   GFRNONAA >60 09/11/2012 0401   GFRAA 90 09/10/2020 1409   GFRAA >60 09/11/2012 0401   COAG Lab Results  Component Value Date   INR 1.4 (H) 07/11/2021   INR 1.3 (H) 07/10/2021   INR 1.07 10/07/2017     Disposition:  Discharge to :Home  Allergies as of 09/20/2022       Reactions   Plavix [clopidogrel] Other (See Comments)   States "I hurt so bad I couldn't get out of bed."   Other Itching   Cat Gut sutures    Oxycodone Hives   Amlodipine Hives   Augmentin [amoxicillin-pot Clavulanate] Diarrhea   Azithromycin Hives   Contrast Media [iodinated Contrast Media] Hives   Doxycycline Diarrhea   Erythromycin Diarrhea   Hydrocodone Nausea Only   Lactose Intolerance (gi) Diarrhea   Bloating   Minocycline Diarrhea   Nsaids Other (See Comments)   Internal Bleeding   Relafen [nabumetone] Hives   Sulfa Antibiotics Hives   Synthroid [levothyroxine] Diarrhea   Tetracyclines & Related Diarrhea   Tilactase    Tramadol Rash   Takes +/- Benadryl        Medication List     STOP taking these medications    cilostazol 100 MG tablet Commonly known as: PLETAL   clopidogrel 75 MG tablet Commonly known as: PLAVIX       TAKE these medications    acetaminophen 500 MG tablet Commonly known as: TYLENOL Take 500 mg by mouth every 6 (six) hours as needed.   albuterol 108 (90 Base) MCG/ACT inhaler Commonly known as: VENTOLIN HFA Inhale 2 puffs into the lungs every 6 (six) hours as needed for wheezing or shortness of breath.   aspirin EC 81 MG tablet Take 1 tablet (81 mg total) by mouth daily at 6 (six) AM. Swallow whole. Start taking on: September 21, 2022   atenolol 25 MG tablet Commonly known as: TENORMIN Take 1 tablet (25 mg total) by mouth daily.   atorvastatin 10 MG tablet Commonly known as: Lipitor Take 1 tablet (10 mg total) by mouth daily.   B-COMPLEX-C PO Take 1 tablet by mouth daily.   benazepril 10 MG tablet Commonly known as:  LOTENSIN Take 1 tablet (10 mg total) by mouth daily.   Collagen 1500/C 500-50-0.8 MG Caps Generic drug: Collagen-Vitamin C-Biotin Take by mouth.   cyclobenzaprine 5 MG tablet Commonly known as: FLEXERIL Take 1 tablet (5 mg total) by mouth 3 (three) times daily as needed for muscle spasms.   diphenhydrAMINE 25 MG tablet Commonly known as: BENADRYL Take 25 mg by mouth every 6 (six) hours as needed.   fluticasone 50 MCG/ACT nasal spray Commonly known as: FLONASE 2 sprays into each nostril in the morning. Up to three sprays.   gabapentin 300 MG capsule  Commonly known as: NEURONTIN Take 1 capsule (300 mg total) by mouth 2 (two) times daily.   hydrOXYzine 25 MG tablet Commonly known as: ATARAX Take 1 tablet (25 mg total) by mouth 3 (three) times daily as needed.   JOINT HEALTH PO Take by mouth daily.   lidocaine 5 % Commonly known as: LIDODERM Place 1 patch onto the skin daily. Remove & Discard patch within 12 hours or as directed by MD   montelukast 10 MG tablet Commonly known as: SINGULAIR Take 1 tablet (10 mg total) by mouth at bedtime.   multivitamin with minerals tablet Take 1 tablet by mouth daily.   NIACINAMIDE PO Take 1 capsule by mouth daily.   omeprazole 20 MG capsule Commonly known as: PRILOSEC Take 1 capsule (20 mg total) by mouth daily.   thyroid 90 MG tablet Commonly known as: Armour Thyroid Take 1 tablet (90 mg total) by mouth every other day.   ticagrelor 90 MG Tabs tablet Commonly known as: BRILINTA Take 1 tablet (90 mg total) by mouth 2 (two) times daily.   Trelegy Ellipta 100-62.5-25 MCG/ACT Aepb Generic drug: Fluticasone-Umeclidin-Vilant Inhale 1 puff into the lungs daily for 1 day.       Verbal and written Discharge instructions given to the patient. Wound care per Discharge AVS  Follow-up Information     Dew, Erskine Squibb, MD Follow up in 1 month(s).   Specialties: Vascular Surgery, Radiology, Interventional Cardiology Why: Follow up with  Eulogio Ditch with right carotid duplex ultrasound Contact information: [address] [place] [address] [phone]                 Signed: Drema Pry, NP  09/20/2022, 8:15 AM

## 2022-09-20 NOTE — Progress Notes (Signed)
Pt transported to DC via [place] accompanied by Nurse Felix Pacini. Pt Alert and oriented, PIV's were removed and patient verbalized no further questions.

## 2022-09-21 LAB — POCT ACTIVATED CLOTTING TIME: Activated Clotting Time: 276 seconds

## 2022-09-27 ENCOUNTER — Ambulatory Visit: Payer: Medicare Other | Admitting: Family Medicine

## 2022-09-29 ENCOUNTER — Telehealth (INDEPENDENT_AMBULATORY_CARE_PROVIDER_SITE_OTHER): Payer: Medicare Other | Admitting: Family Medicine

## 2022-09-29 ENCOUNTER — Encounter: Payer: Self-pay | Admitting: Family Medicine

## 2022-09-29 DIAGNOSIS — Z5982 Transportation insecurity: Secondary | ICD-10-CM

## 2022-09-29 DIAGNOSIS — K219 Gastro-esophageal reflux disease without esophagitis: Secondary | ICD-10-CM

## 2022-09-29 DIAGNOSIS — I6523 Occlusion and stenosis of bilateral carotid arteries: Secondary | ICD-10-CM | POA: Diagnosis not present

## 2022-09-29 DIAGNOSIS — E039 Hypothyroidism, unspecified: Secondary | ICD-10-CM

## 2022-09-29 MED ORDER — OMEPRAZOLE 20 MG PO CPDR: 20.0000 mg | DELAYED_RELEASE_CAPSULE | Freq: Every day | ORAL | 1 refills | Status: DC

## 2022-09-29 NOTE — Assessment & Plan Note (Signed)
Doing well on her current regimen. No concerns. Continue to monitor.

## 2022-09-29 NOTE — Progress Notes (Signed)
There were no vitals taken for this visit.   Subjective:    Patient ID: Savannah Mcclure, female    DOB: Aug 18, 1940, 82 y.o.   MRN: [id number]  HPI: Savannah Mcclure is a 82 y.o. female  Chief Complaint  Patient presents with   Hypothyroidism   Gastroesophageal Reflux   Had a stent done in her carotid US. She is recovering well. No concerns.   HYPOTHYROIDISM Thyroid control status:controlled Satisfied with current treatment? yes Medication side effects: no Medication compliance: excellent compliance Etiology of hypothyroidism:  Recent dose adjustment:no Fatigue: no Cold intolerance: no Heat intolerance: no Weight gain: no Weight loss: no Constipation: no Diarrhea/loose stools: no Palpitations: no Lower extremity edema: no Anxiety/depressed mood: no  GERD GERD control status: exacerbated Satisfied with current treatment? no Heartburn frequency: chronic Medication side effects: no  Medication compliance: worse Previous GERD medications: omeprazole Dysphagia: no Odynophagia:  no Hematemesis: no Blood in stool: no EGD: yes   Relevant past medical, surgical, family and social history reviewed and updated as indicated. Interim medical history since our last visit reviewed. Allergies and medications reviewed and updated.  Review of Systems  Constitutional: Negative.   Respiratory: Negative.    Cardiovascular: Negative.   Gastrointestinal: Negative.   Musculoskeletal: Negative.   Psychiatric/Behavioral: Negative.      Per HPI unless specifically indicated above     Objective:    There were no vitals taken for this visit.  Wt Readings from Last 3 Encounters:  09/19/22 115 lb (52.2 kg)  08/30/22 112 lb (50.8 kg)  07/21/22 121 lb 1.6 oz (54.9 kg)    Physical Exam Vitals and nursing note reviewed.  Constitutional:      General: She is not in acute distress.    Appearance: Normal appearance. She is not ill-appearing, toxic-appearing or  diaphoretic.  HENT:     Head: Normocephalic and atraumatic.     Right Ear: External ear normal.     Left Ear: External ear normal.     Nose: Nose normal.     Mouth/Throat:     Mouth: Mucous membranes are moist.     Pharynx: Oropharynx is clear.  Eyes:     General: No scleral icterus.       Right eye: No discharge.        Left eye: No discharge.     Conjunctiva/sclera: Conjunctivae normal.     Pupils: Pupils are equal, round, and reactive to light.  Pulmonary:     Effort: Pulmonary effort is normal. No respiratory distress.     Comments: Speaking in full sentences Musculoskeletal:        General: Normal range of motion.     Cervical back: Normal range of motion.  Skin:    Coloration: Skin is not jaundiced or pale.     Findings: No bruising, erythema, lesion or rash.  Neurological:     Mental Status: She is alert and oriented to person, place, and time. Mental status is at baseline.  Psychiatric:        Mood and Affect: Mood normal.        Behavior: Behavior normal.        Thought Content: Thought content normal.        Judgment: Judgment normal.     Results for orders placed or performed during the hospital encounter of 09/19/22  MRSA Next Gen by PCR, Nasal   Specimen: Nasal Mucosa; Nasal Swab  Result Value Ref Range   MRSA by PCR  Next Gen NOT DETECTED NOT DETECTED  BUN  Result Value Ref Range   BUN 15 8 - 23 mg/dL  Creatinine, serum  Result Value Ref Range   Creatinine, Ser 0.81 0.44 - 1.00 mg/dL   GFR, Estimated >60 >60 mL/min  Glucose, capillary  Result Value Ref Range   Glucose-Capillary 148 (H) 70 - 99 mg/dL  CBC  Result Value Ref Range   WBC 23.8 (H) 4.0 - 10.5 K/uL   RBC 3.63 (L) 3.87 - 5.11 MIL/uL   Hemoglobin 11.0 (L) 12.0 - 15.0 g/dL   HCT 33.2 (L) 36.0 - 46.0 %   MCV 91.5 80.0 - 100.0 fL   MCH 30.3 26.0 - 34.0 pg   MCHC 33.1 30.0 - 36.0 g/dL   RDW 14.0 11.5 - 15.5 %   Platelets 370 150 - 400 K/uL   nRBC 0.0 0.0 - 0.2 %  Basic metabolic panel   Result Value Ref Range   Sodium 138 135 - 145 mmol/L   Potassium 3.6 3.5 - 5.1 mmol/L   Chloride 108 98 - 111 mmol/L   CO2 21 (L) 22 - 32 mmol/L   Glucose, Bld 164 (H) 70 - 99 mg/dL   BUN 14 8 - 23 mg/dL   Creatinine, Ser 0.78 0.44 - 1.00 mg/dL   Calcium 8.3 (L) 8.9 - 10.3 mg/dL   GFR, Estimated >60 >60 mL/min   Anion gap 9 5 - 15  POCT Activated clotting time  Result Value Ref Range   Activated Clotting Time 276 seconds      Assessment & Plan:   Problem List Items Addressed This Visit       Endocrine   Hypothyroidism - Primary    Doing well on her current regimen. No concerns. Continue to monitor.       Other Visit Diagnoses     Gastroesophageal reflux disease, unspecified whether esophagitis present       Relevant Medications   omeprazole (PRILOSEC) 20 MG capsule   Transportation insecurity       Will get CCM involved. Referral generated today.   Relevant Orders   AMB Referral to [place] ([facility])        Follow up plan: Return in about 2 months (around 11/28/2022) for wellness/follow up.   This visit was completed via video visit through MyChart due to the restrictions of the COVID-19 pandemic. All issues as above were discussed and addressed. Physical exam was done as above through visual confirmation on video through MyChart. If it was felt that the patient should be evaluated in the office, they were directed there. The patient verbally consented to this visit. Location of the patient: home Location of the provider: work Those involved with this call:  Provider: Park Liter, DO CMA: Irena Reichmann, [place] Desk/Registration: [employer]  Time spent on call:  15 minutes with patient face to face via video conference. More than 50% of this time was spent in counseling and coordination of care. 23 minutes total spent in review of patient's record and preparation of their chart.

## 2022-09-30 ENCOUNTER — Telehealth: Payer: Self-pay

## 2022-09-30 NOTE — Progress Notes (Signed)
Appointment has been made

## 2022-09-30 NOTE — Telephone Encounter (Signed)
   Telephone encounter was:  Successful.  09/30/2022 Name: Savannah Mcclure MRN: [id number] DOB: 16-May-1941  [name] is a 82 y.o. year old female who is a primary care patient of [name], DO . The community resource team was consulted for assistance with Transportation Needs   Care guide performed the following interventions: Spoke with patient about [employer] transportation verified demographic information, walk aids and number of steps to the home.  Spoke with Sharyn Lull at [employer] a Freight forwarder will be sent out to the patient's home to assess the driveway and steps. I explained the process to the patient and to expect a call from [place].  Sharyn Lull stated that they should be able to provide transportation to the 10/19/22 appointment.  I asked her to call me and confirm the ride. [employer] does not serve her area.  Follow Up Plan:  Care guide will follow up with patient by phone over the next 7 days.  [place] Resource Care Guide   [email or web address]  ?? [id number]   Website: [email or web address]  [email or web address]

## 2022-10-04 ENCOUNTER — Telehealth: Payer: Self-pay

## 2022-10-04 NOTE — Telephone Encounter (Signed)
   Telephone encounter was:  Unsuccessful.  10/04/2022 Name: Savannah Mcclure MRN: [id number] DOB: 1940/09/13  Unsuccessful outbound call made today to assist with:  Transportation Needs   Outreach Attempt:  3rd Attempt.  Referral closed unable to contact patient.  A HIPAA compliant voice message was left requesting a return call.  Instructed patient to call back at [phone]. Left message on voicemail informing patient of her pickup time 10/19/22 12-12:15pm with [place] transportation. Patient has my contact information if any questions.  [place] Resource Care Guide   [name]  ?? [id number]   Website: [email or web address]  [email or web address]

## 2022-10-17 ENCOUNTER — Ambulatory Visit (INDEPENDENT_AMBULATORY_CARE_PROVIDER_SITE_OTHER): Payer: Medicare Other

## 2022-10-17 ENCOUNTER — Other Ambulatory Visit (INDEPENDENT_AMBULATORY_CARE_PROVIDER_SITE_OTHER): Payer: Self-pay | Admitting: Vascular Surgery

## 2022-10-17 VITALS — Ht 63.0 in | Wt 112.0 lb

## 2022-10-17 DIAGNOSIS — I6523 Occlusion and stenosis of bilateral carotid arteries: Secondary | ICD-10-CM

## 2022-10-17 DIAGNOSIS — Z0000 Encounter for general adult medical examination without abnormal findings: Secondary | ICD-10-CM

## 2022-10-17 NOTE — Progress Notes (Signed)
I connected with  [place] on 10/17/22 by a audio enabled telemedicine application and verified that I am speaking with the correct person using two identifiers.  Patient Location: Home  Provider Location: Office/Clinic  I discussed the limitations of evaluation and management by telemedicine. The patient expressed understanding and agreed to proceed.  Subjective:   Savannah Mcclure is a 82 y.o. female who presents for Medicare Annual (Subsequent) preventive examination.  Review of Systems     Cardiac Risk Factors include: advanced age (>38mn, >>38women);hypertension;sedentary lifestyle;smoking/ tobacco exposure     Objective:    Today's Vitals   10/17/22 1103  PainSc: 5    There is no height or weight on file to calculate BMI.     10/17/2022   11:13 AM 09/19/2022   12:50 PM 06/14/2022   11:06 AM 05/19/2022    2:28 PM 09/29/2021    9:05 AM 07/10/2021   11:53 AM 12/23/2020    2:13 PM  Advanced Directives  Does Patient Have a Medical Advance Directive? No Yes Yes Yes Yes No Yes  Type of Advance Directive  Living will Living will  [place]  Does patient want to make changes to medical advance directive?  No - Patient declined       Copy of [place] Chart?     No - copy requested  No - copy requested  Would patient like information on creating a medical advance directive? No - Patient declined          Current Medications (verified) Outpatient Encounter Medications as of 10/17/2022  Medication Sig   albuterol (VENTOLIN HFA) 108 (90 Base) MCG/ACT inhaler Inhale 2 puffs into the lungs every 6 (six) hours as needed for wheezing or shortness of breath.   aspirin EC 81 MG tablet Take 1 tablet (81 mg total) by mouth daily at 6 (six) AM. Swallow whole.   atenolol (TENORMIN) 25 MG tablet Take 1 tablet (25 mg total) by mouth daily.   atorvastatin (LIPITOR) 10 MG tablet Take 1  tablet (10 mg total) by mouth daily.   B-COMPLEX-C PO Take 1 tablet by mouth daily.   benazepril (LOTENSIN) 10 MG tablet Take 1 tablet (10 mg total) by mouth daily.   Collagen-Vitamin C-Biotin (COLLAGEN 1500/C) 500-50-0.8 MG CAPS Take by mouth.   cyclobenzaprine (FLEXERIL) 5 MG tablet Take 1 tablet (5 mg total) by mouth 3 (three) times daily as needed for muscle spasms.   diphenhydrAMINE (BENADRYL) 25 MG tablet Take 25 mg by mouth every 6 (six) hours as needed.    fluticasone (FLONASE) 50 MCG/ACT nasal spray 2 sprays into each nostril in the morning. Up to three sprays.   gabapentin (NEURONTIN) 300 MG capsule Take 1 capsule (300 mg total) by mouth 2 (two) times daily.   hydrOXYzine (ATARAX) 25 MG tablet Take 1 tablet (25 mg total) by mouth 3 (three) times daily as needed.   lidocaine (LIDODERM) 5 % Place 1 patch onto the skin daily. Remove & Discard patch within 12 hours or as directed by MD   Misc Natural Products ([place] Take by mouth daily.   Multiple Vitamins-Minerals (MULTIVITAMIN WITH MINERALS) tablet Take 1 tablet by mouth daily.   NIACINAMIDE PO Take 1 capsule by mouth daily.   omeprazole (PRILOSEC) 20 MG capsule Take 1 capsule (20 mg total) by mouth daily.   thyroid (ARMOUR THYROID) 90 MG tablet Take 1 tablet (90 mg  total) by mouth every other day.   ticagrelor (BRILINTA) 90 MG TABS tablet Take 1 tablet (90 mg total) by mouth 2 (two) times daily.   TRELEGY ELLIPTA 100-62.5-25 MCG/ACT AEPB Inhale 1 puff into the lungs daily for 1 day.   acetaminophen (TYLENOL) 500 MG tablet Take 500 mg by mouth every 6 (six) hours as needed. (Patient not taking: Reported on 10/17/2022)   montelukast (SINGULAIR) 10 MG tablet Take 1 tablet (10 mg total) by mouth at bedtime. (Patient not taking: Reported on 10/17/2022)   Facility-Administered Encounter Medications as of 10/17/2022  Medication   albuterol (PROVENTIL) (2.5 MG/3ML) 0.083% nebulizer solution 2.5 mg    Allergies (verified) Plavix  [clopidogrel], Other, Oxycodone, Amlodipine, Augmentin [amoxicillin-pot clavulanate], Azithromycin, Contrast media [iodinated contrast media], Doxycycline, Erythromycin, Hydrocodone, Lactose intolerance (gi), Minocycline, Nsaids, Relafen [nabumetone], Sulfa antibiotics, Synthroid [levothyroxine], Tetracyclines & related, Tilactase, and Tramadol   History: Past Medical History:  Diagnosis Date   Abdominal distension 03/18/2012   Abnormal weight loss 10/30/2017   Acute kidney injury (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    COPD (chronic obstructive pulmonary disease) (HCC)    Delirium 03/18/2012   Dyspnea    GERD (gastroesophageal reflux disease)    Heart murmur    Hypertension    Leaky heart valve    Oxygen deficit    uses O2 at night 2 liters   Peripheral vascular disease ([place])    Scoliosis    Sepsis ([place])    Shock ([place]) 03/23/2012   SIRS (systemic inflammatory response syndrome) ([place]) 03/23/2012   Spinal stenosis    Stroke [facility])    Eye   Tachycardia    Past Surgical History:  Procedure Laterality Date   BACK SURGERY     BREAST LUMPECTOMY Left    CAROTID PTA/STENT INTERVENTION Right 09/19/2022   Procedure: CAROTID PTA/STENT INTERVENTION;  Surgeon: Algernon Huxley, MD;  Location: [facility];  Service: Cardiovascular;  Laterality: Right;   CATARACT EXTRACTION W/PHACO Left 08/14/2019   Procedure: CATARACT EXTRACTION PHACO AND INTRAOCULAR LENS PLACEMENT (IOC) LEFT 10.22 00:59.7 42.4%;  Surgeon: Leandrew Koyanagi, MD;  Location: [facility];  Service: Ophthalmology;  Laterality: Left;  leave it last patient per [place] Right 06/20/2018   Procedure: LOWER EXTREMITY ANGIOGRAPHY;  Surgeon: Algernon Huxley, MD;  Location: [facility];   Service: Cardiovascular;  Laterality: Right;   LOWER EXTREMITY ANGIOGRAPHY Left 02/03/2020   Procedure: LOWER EXTREMITY ANGIOGRAPHY;  Surgeon: Algernon Huxley, MD;  Location: [facility];  Service: Cardiovascular;  Laterality: Left;   right eye surgery     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   TOTAL ABDOMINAL HYSTERECTOMY     total   WISDOM TOOTH EXTRACTION     Family History  Problem Relation Age of Onset   Intracerebral hemorrhage Mother    Hypertension Mother    Heart failure Father    Schizophrenia Daughter    Rheum arthritis Maternal Grandmother    Kidney disease Maternal Grandmother    Heart disease Paternal Grandmother    Stroke Paternal Grandmother    Stroke Paternal Grandfather    Heart disease Paternal Grandfather    Social History  Socioeconomic History   Marital status: Widowed    Spouse name: Not on file   Number of children: 2   Years of education: college    Highest education level: Not on file  Occupational History   Occupation: retired  Tobacco Use   Smoking status: Every Day    Packs/day: 0.50    Years: 40.00    Total pack years: 20.00    Types: Cigarettes   Smokeless tobacco: Never   Tobacco comments:    0.5PPD 08/31/2021  Vaping Use   Vaping Use: Former  Substance and Sexual Activity   Alcohol use: Yes    Comment: wine on occasion (1x/month)   Drug use: No   Sexual activity: Not Currently  Other Topics Concern   Not on file  Social History Narrative   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 And grandkids (58 & 52 y.o.)   Social Determinants of Health   Financial Resource Strain: Low Risk  (10/17/2022)   Overall Financial Resource Strain (CARDIA)    Difficulty of Paying Living Expenses: Not very hard  Food Insecurity: No Food Insecurity (10/17/2022)   Hunger Vital Sign    Worried About Running Out of Food in the Last Year: Never true    Ran Out of Food in the Last Year: Never true  Transportation Needs: No Transportation Needs  (10/17/2022)   PRAPARE - Hydrologist (Medical): No    Lack of Transportation (Non-Medical): No  Recent Concern: Transportation Needs - Unmet Transportation Needs (09/30/2022)   PRAPARE - Transportation    Lack of Transportation (Medical): Yes    Lack of Transportation (Non-Medical): Yes  Physical Activity: Inactive (10/17/2022)   Exercise Vital Sign    Days of Exercise per Week: 0 days    Minutes of Exercise per Session: 0 min  Stress: No Stress Concern Present (10/17/2022)   [place]    Feeling of Stress : Only a little  Social Connections: Socially Isolated (10/17/2022)   Social Connection and Isolation Panel [NHANES]    Frequency of Communication with Friends and Family: Once a week    Frequency of Social Gatherings with Friends and Family: Never    Attends Religious Services: Never    Marine scientist or Organizations: No    Attends Archivist Meetings: Never    Marital Status: Widowed    Tobacco Counseling Ready to quit: Not Answered Counseling given: Not Answered Tobacco comments: 0.5PPD 08/31/2021   Clinical Intake:  Pre-visit preparation completed: Yes  Pain : 0-10 Pain Score: 5  Pain Location: Knee Pain Orientation: Left Pain Radiating Towards: kneecap     Nutritional Risks: None Diabetes: No  How often do you need to have someone help you when you read instructions, pamphlets, or other written materials from your doctor or pharmacy?: 1 - Never  Diabetic?no  Interpreter Needed?: No  Information entered by :: Kirke Shaggy, LPN   Activities of Daily Living    10/17/2022   11:13 AM 09/19/2022   12:50 PM  In your present state of health, do you have any difficulty performing the following activities:  Hearing? 0 0  Vision? 0 0  Difficulty concentrating or making decisions? 0 0  Walking or climbing stairs? 1 1  Comment due to her knee   Dressing or  bathing? 0 0  Doing errands, shopping? 0 0  Preparing Food and eating ? N   Using the Toilet? N  In the past six months, have you accidently leaked urine? N   Do you have problems with loss of bowel control? N   Managing your Medications? N   Managing your Finances? N   Housekeeping or managing your Housekeeping? N     Patient Care Team: [name], DO as PCP - General (Family Medicine) Clyde Canterbury, MD as Referring Physician (Otolaryngology)  Indicate any recent Medical Services you may have received from other than [facility] providers in the past year (date may be approximate).     Assessment:   This is a routine wellness examination for Savannah.  Hearing/Vision screen Hearing Screening - Comments:: No aids Vision Screening - Comments:: Wears glasses- [facility]  Dietary issues and exercise activities discussed: Current Exercise Habits: The patient does not participate in regular exercise at present, Exercise limited by: orthopedic condition(s)   Goals Addressed             This Visit's Progress    DIET - EAT MORE FRUITS AND VEGETABLES         Depression Screen    10/17/2022   11:10 AM 07/21/2022    3:03 PM 06/14/2022   11:05 AM 05/31/2022    3:09 PM 05/19/2022    2:27 PM 10/19/2021    1:08 PM 09/29/2021    9:33 AM  PHQ 2/9 Scores  PHQ - 2 Score 0 0 0 0 0 0 0  PHQ- 9 Score  0  1  0 2    Fall Risk    10/17/2022   11:13 AM 07/21/2022    3:03 PM 06/14/2022   11:05 AM 05/31/2022    3:09 PM 05/19/2022    2:27 PM  Fall Risk   Falls in the past year? 1 0 0 1 1  Number falls in past yr: 0 0  0 0  Injury with Fall? 0 0  0 0  Risk for fall due to : History of fall(s) No Fall Risks  Impaired balance/gait   Follow up Falls prevention discussed;Falls evaluation completed Falls evaluation completed  Falls evaluation completed     FALL RISK PREVENTION PERTAINING TO THE HOME:  Any stairs in or around the home? Yes  If so, are there any without handrails? No  Home  free of loose throw rugs in walkways, pet beds, electrical cords, etc? Yes  Adequate lighting in your home to reduce risk of falls? Yes   ASSISTIVE DEVICES UTILIZED TO PREVENT FALLS:  Life alert? No  Use of a cane, walker or w/c? Yes -cane Grab bars in the bathroom? No  Shower chair or bench in shower? Yes  Elevated toilet seat or a handicapped toilet? No    Cognitive Function:        10/17/2022   11:19 AM 03/09/2020    3:33 PM 11/20/2017    3:54 PM  6CIT Screen  What Year? 0 points 0 points 0 points  What month? 0 points 0 points 0 points  What time? 0 points 0 points 0 points  Count back from 20 0 points 0 points 0 points  Months in reverse 0 points 0 points 0 points  Repeat phrase 0 points 0 points 0 points  Total Score 0 points 0 points 0 points    Immunizations Immunization History  Administered Date(s) Administered   Covid-19, Mrna,Vaccine(Spikevax)52yr and older 06/14/2022   Fluad Quad(high Dose 65+) 04/25/2019, 06/01/2020, 05/27/2021, 05/26/2022   Influenza, High Dose Seasonal PF 04/23/2018   Influenza-Unspecified 06/15/2017, 04/23/2018  PFIZER Comirnaty(Gray Top)Covid-19 Tri-Sucrose Vaccine 12/08/2020   PFIZER(Purple Top)SARS-COV-2 Vaccination 08/22/2019, 09/14/2019, 05/04/2020   Pfizer Covid-19 Vaccine Bivalent Booster 52yr & up 06/08/2021   Pneumococcal Conjugate-13 09/06/2018   Pneumococcal Polysaccharide-23 03/09/2020   Td 12/12/2017    TDAP status: Up to date  Flu Vaccine status: Up to date  Pneumococcal vaccine status: Up to date  Covid-19 vaccine status: Completed vaccines  Qualifies for Shingles Vaccine? Yes   Zostavax completed No   Shingrix Completed?: No.    Education has been provided regarding the importance of this vaccine. Patient has been advised to call insurance company to determine out of pocket expense if they have not yet received this vaccine. Advised may also receive vaccine at local pharmacy or Health Dept. Verbalized acceptance  and understanding.  Screening Tests Health Maintenance  Topic Date Due   Zoster Vaccines- Shingrix (1 of 2) Never done   COVID-19 Vaccine (7 - 2023-24 season) 08/09/2022   Medicare Annual Wellness (AWV)  10/17/2023   DTaP/Tdap/Td (2 - Tdap) 12/13/2027   Pneumonia Vaccine 82 Years old  Completed   INFLUENZA VACCINE  Completed   DEXA SCAN  Completed   HPV VACCINES  Aged Out    Health Maintenance  Health Maintenance Due  Topic Date Due   Zoster Vaccines- Shingrix (1 of 2) Never done   COVID-19 Vaccine (7 - 2023-24 season) 08/09/2022    Colorectal cancer screening: No longer required.   Mammogram status: No longer required due to age.  Bone Density status: Ordered 05/31/22. Pt provided with contact info and advised to call to schedule appt.  Lung Cancer Screening: (Low Dose CT Chest recommended if Age 83-80years, 30 pack-year currently smoking OR have quit w/in 15years.) does qualify.   Lung Cancer Screening Referral: declined referral  Additional Screening:  Hepatitis C Screening: does not qualify; Completed no  Vision Screening: Recommended annual ophthalmology exams for early detection of glaucoma and other disorders of the eye. Is the patient up to date with their annual eye exam?  Yes  Who is the provider or what is the name of the office in which the patient attends annual eye exams? [email or web address] pt is not established with a provider, would they like to be referred to a provider to establish care? No .   Dental Screening: Recommended annual dental exams for proper oral hygiene  Community Resource Referral / Chronic Care Management: CRR required this visit?  No   CCM required this visit?  No      Plan:     I have personally reviewed and noted the following in the patient's chart:   Medical and social history Use of alcohol, tobacco or illicit drugs  Current medications and supplements including opioid prescriptions. Patient is not currently taking  opioid prescriptions. Functional ability and status Nutritional status Physical activity Advanced directives List of other physicians Hospitalizations, surgeries, and ER visits in previous 12 months Vitals Screenings to include cognitive, depression, and falls Referrals and appointments  In addition, I have reviewed and discussed with patient certain preventive protocols, quality metrics, and best practice recommendations. A written personalized care plan for preventive services as well as general preventive health recommendations were provided to patient.     LDionisio David LPN   [id number]  Nurse Notes: none

## 2022-10-17 NOTE — Patient Instructions (Signed)
Savannah Mcclure , Thank you for taking time to come for your Medicare Wellness Visit. I appreciate your ongoing commitment to your health goals. Please review the following plan we discussed and let me know if I can assist you in the future.   These are the goals we discussed:  Goals      DIET - EAT MORE FRUITS AND VEGETABLES     Patient Stated     Continue current lifestyle     Quit Smoking     Smoking cessation discussed        This is a list of the screening recommended for you and due dates:  Health Maintenance  Topic Date Due   Zoster (Shingles) Vaccine (1 of 2) Never done   COVID-19 Vaccine (7 - 2023-24 season) 08/09/2022   Medicare Annual Wellness Visit  10/17/2023   DTaP/Tdap/Td vaccine (2 - Tdap) 12/13/2027   Pneumonia Vaccine  Completed   Flu Shot  Completed   DEXA scan (bone density measurement)  Completed   HPV Vaccine  Aged Out    Advanced directives: no  Conditions/risks identified: none  Next appointment: Follow up in one year for your annual wellness visit 10/23/23 @ 10:30 am by phone   Preventive Care 65 Years and Older, Female Preventive care refers to lifestyle choices and visits with your health care provider that can promote health and wellness. What does preventive care include? A yearly physical exam. This is also called an annual well check. Dental exams once or twice a year. Routine eye exams. Ask your health care provider how often you should have your eyes checked. Personal lifestyle choices, including: Daily care of your teeth and gums. Regular physical activity. Eating a healthy diet. Avoiding tobacco and drug use. Limiting alcohol use. Practicing safe sex. Taking low-dose aspirin every day. Taking vitamin and mineral supplements as recommended by your health care provider. What happens during an annual well check? The services and screenings done by your health care provider during your annual well check will depend on your age,  overall health, lifestyle risk factors, and family history of disease. Counseling  Your health care provider may ask you questions about your: Alcohol use. Tobacco use. Drug use. Emotional well-being. Home and relationship well-being. Sexual activity. Eating habits. History of falls. Memory and ability to understand (cognition). Work and work Statistician. Reproductive health. Screening  You may have the following tests or measurements: Height, weight, and BMI. Blood pressure. Lipid and cholesterol levels. These may be checked every 5 years, or more frequently if you are over 35 years old. Skin check. Lung cancer screening. You may have this screening every year starting at age 57 if you have a 30-pack-year history of smoking and currently smoke or have quit within the past 15 years. Fecal occult blood test (FOBT) of the stool. You may have this test every year starting at age 71. Flexible sigmoidoscopy or colonoscopy. You may have a sigmoidoscopy every 5 years or a colonoscopy every 10 years starting at age 51. Hepatitis C blood test. Hepatitis B blood test. Sexually transmitted disease (STD) testing. Diabetes screening. This is done by checking your blood sugar (glucose) after you have not eaten for a while (fasting). You may have this done every 1-3 years. Bone density scan. This is done to screen for osteoporosis. You may have this done starting at age 65. Mammogram. This may be done every 1-2 years. Talk to your health care provider about how often you should have regular mammograms.  Talk with your health care provider about your test results, treatment options, and if necessary, the need for more tests. Vaccines  Your health care provider may recommend certain vaccines, such as: Influenza vaccine. This is recommended every year. Tetanus, diphtheria, and acellular pertussis (Tdap, Td) vaccine. You may need a Td booster every 10 years. Zoster vaccine. You may need this after age  67. Pneumococcal 13-valent conjugate (PCV13) vaccine. One dose is recommended after age 43. Pneumococcal polysaccharide (PPSV23) vaccine. One dose is recommended after age 54. Talk to your health care provider about which screenings and vaccines you need and how often you need them. This information is not intended to replace advice given to you by your health care provider. Make sure you discuss any questions you have with your health care provider. Document Released: 08/28/2015 Document Revised: 04/20/2016 Document Reviewed: 06/02/2015 Elsevier Interactive Patient Education  2017 [place] Prevention in the Home Falls can cause injuries. They can happen to people of all ages. There are many things you can do to make your home safe and to help prevent falls. What can I do on the outside of my home? Regularly fix the edges of walkways and driveways and fix any cracks. Remove anything that might make you trip as you walk through a door, such as a raised step or threshold. Trim any bushes or trees on the path to your home. Use bright outdoor lighting. Clear any walking paths of anything that might make someone trip, such as rocks or tools. Regularly check to see if handrails are loose or broken. Make sure that both sides of any steps have handrails. Any raised decks and porches should have guardrails on the edges. Have any leaves, snow, or ice cleared regularly. Use sand or salt on walking paths during winter. Clean up any spills in your garage right away. This includes oil or grease spills. What can I do in the bathroom? Use night lights. Install grab bars by the toilet and in the tub and shower. Do not use towel bars as grab bars. Use non-skid mats or decals in the tub or shower. If you need to sit down in the shower, use a plastic, non-slip stool. Keep the floor dry. Clean up any water that spills on the floor as soon as it happens. Remove soap buildup in the tub or shower  regularly. Attach bath mats securely with double-sided non-slip rug tape. Do not have throw rugs and other things on the floor that can make you trip. What can I do in the bedroom? Use night lights. Make sure that you have a light by your bed that is easy to reach. Do not use any sheets or blankets that are too big for your bed. They should not hang down onto the floor. Have a firm chair that has side arms. You can use this for support while you get dressed. Do not have throw rugs and other things on the floor that can make you trip. What can I do in the kitchen? Clean up any spills right away. Avoid walking on wet floors. Keep items that you use a lot in easy-to-reach places. If you need to reach something above you, use a strong step stool that has a grab bar. Keep electrical cords out of the way. Do not use floor polish or wax that makes floors slippery. If you must use wax, use non-skid floor wax. Do not have throw rugs and other things on the floor that can make  you trip. What can I do with my stairs? Do not leave any items on the stairs. Make sure that there are handrails on both sides of the stairs and use them. Fix handrails that are broken or loose. Make sure that handrails are as long as the stairways. Check any carpeting to make sure that it is firmly attached to the stairs. Fix any carpet that is loose or worn. Avoid having throw rugs at the top or bottom of the stairs. If you do have throw rugs, attach them to the floor with carpet tape. Make sure that you have a light switch at the top of the stairs and the bottom of the stairs. If you do not have them, ask someone to add them for you. What else can I do to help prevent falls? Wear shoes that: Do not have high heels. Have rubber bottoms. Are comfortable and fit you well. Are closed at the toe. Do not wear sandals. If you use a stepladder: Make sure that it is fully opened. Do not climb a closed stepladder. Make sure that  both sides of the stepladder are locked into place. Ask someone to hold it for you, if possible. Clearly mark and make sure that you can see: Any grab bars or handrails. First and last steps. Where the edge of each step is. Use tools that help you move around (mobility aids) if they are needed. These include: Canes. Walkers. Scooters. Crutches. Turn on the lights when you go into a dark area. Replace any light bulbs as soon as they burn out. Set up your furniture so you have a clear path. Avoid moving your furniture around. If any of your floors are uneven, fix them. If there are any pets around you, be aware of where they are. Review your medicines with your doctor. Some medicines can make you feel dizzy. This can increase your chance of falling. Ask your doctor what other things that you can do to help prevent falls. This information is not intended to replace advice given to you by your health care provider. Make sure you discuss any questions you have with your health care provider. Document Released: 05/28/2009 Document Revised: 01/07/2016 Document Reviewed: 09/05/2014 Elsevier Interactive Patient Education  2017 [employer].

## 2022-10-19 ENCOUNTER — Encounter (INDEPENDENT_AMBULATORY_CARE_PROVIDER_SITE_OTHER): Payer: Self-pay | Admitting: Nurse Practitioner

## 2022-10-19 ENCOUNTER — Ambulatory Visit (INDEPENDENT_AMBULATORY_CARE_PROVIDER_SITE_OTHER): Payer: Medicare Other | Admitting: Nurse Practitioner

## 2022-10-19 ENCOUNTER — Ambulatory Visit (INDEPENDENT_AMBULATORY_CARE_PROVIDER_SITE_OTHER): Payer: Medicare Other

## 2022-10-19 VITALS — BP 128/62 | HR 65 | Resp 15

## 2022-10-19 DIAGNOSIS — I6523 Occlusion and stenosis of bilateral carotid arteries: Secondary | ICD-10-CM | POA: Diagnosis not present

## 2022-10-19 DIAGNOSIS — I10 Essential (primary) hypertension: Secondary | ICD-10-CM

## 2022-10-20 ENCOUNTER — Encounter (INDEPENDENT_AMBULATORY_CARE_PROVIDER_SITE_OTHER): Payer: Self-pay | Admitting: Nurse Practitioner

## 2022-10-20 NOTE — Progress Notes (Signed)
Subjective:    Patient ID: Savannah Mcclure, female    DOB: February 10, 1941, 82 y.o.   MRN: [id number] Chief Complaint  Patient presents with   Follow-up    Ultrasound follow up    The patient is seen for follow up evaluation of carotid stenosis status post right carotid stent on 09/19/2022.  There were no post operative problems or complications related to the surgery.  The patient denies neck or incisional pain.  The patient denies interval amaurosis fugax. There is no recent history of TIA symptoms or focal motor deficits. There is no prior documented CVA.  The patient denies headache.  The patient is taking enteric-coated aspirin 81 mg daily.  No recent shortening of the patient's walking distance or new symptoms consistent with claudication.  No history of rest pain symptoms. No new ulcers or wounds of the lower extremities have occurred.  There is no history of DVT, PE or superficial thrombophlebitis. No recent episodes of angina or shortness of breath documented.   Today the right ICA stent is widely patent with no evidence of stenosis.  The left ICA is 40 to 59% stenosis.     Review of Systems  Neurological:  Positive for weakness.  All other systems reviewed and are negative.      Objective:   Physical Exam Vitals reviewed.  HENT:     Head: Normocephalic.  Neck:     Vascular: Carotid bruit present.  Cardiovascular:     Rate and Rhythm: Normal rate.     Pulses:          Carotid pulses are  on the left side with bruit. Pulmonary:     Effort: Pulmonary effort is normal.  Skin:    General: Skin is warm and dry.  Neurological:     Mental Status: She is alert and oriented to person, place, and time.     Gait: Gait abnormal.  Psychiatric:        Mood and Affect: Mood normal.        Behavior: Behavior normal.        Thought Content: Thought content normal.        Judgment: Judgment normal.     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    COPD (chronic obstructive pulmonary disease) ([place])    Delirium 03/18/2012   Dyspnea    GERD (gastroesophageal reflux disease)    Heart murmur    Hypertension    Leaky heart valve    Oxygen deficit    uses O2 at night 2 liters   Peripheral vascular disease ([place])    Scoliosis    Sepsis ([place])    Shock ([place]) 03/23/2012   SIRS (systemic inflammatory response syndrome) ([place]) 03/23/2012   Spinal stenosis    Stroke [facility])    Eye   Tachycardia     Social History   Socioeconomic History   Marital status: Widowed    Spouse name: Not on file   Number of  children: 2   Years of education: college    Highest education level: Not on file  Occupational History   Occupation: retired  Tobacco Use   Smoking status: Every Day    Packs/day: 0.50    Years: 40.00    Total pack years: 20.00    Types: Cigarettes   Smokeless tobacco: Never   Tobacco comments:    0.5PPD 08/31/2021  Vaping Use   Vaping Use: Former  Substance and Sexual Activity   Alcohol use: Yes    Comment: wine on occasion (1x/month)   Drug use: No   Sexual activity: Not Currently  Other Topics Concern   Not on file  Social History Narrative   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 And grandkids (59 & 55 y.o.)   Social Determinants of Health   Financial Resource Strain: Low Risk  (10/17/2022)   Overall Financial Resource Strain (CARDIA)    Difficulty of Paying Living Expenses: Not very hard  Food Insecurity: No Food Insecurity (10/17/2022)   Hunger Vital Sign    Worried About Running Out of Food in the Last Year: Never true    Ran Out of Food in  the Last Year: Never true  Transportation Needs: No Transportation Needs (10/17/2022)   PRAPARE - Hydrologist (Medical): No    Lack of Transportation (Non-Medical): No  Recent Concern: Transportation Needs - Unmet Transportation Needs (09/30/2022)   PRAPARE - Transportation    Lack of Transportation (Medical): Yes    Lack of Transportation (Non-Medical): Yes  Physical Activity: Inactive (10/17/2022)   Exercise Vital Sign    Days of Exercise per Week: 0 days    Minutes of Exercise per Session: 0 min  Stress: No Stress Concern Present (10/17/2022)   [place]    Feeling of Stress : Only a little  Social Connections: Socially Isolated (10/17/2022)   Social Connection and Isolation Panel [NHANES]    Frequency of Communication with Friends and Family: Once a week    Frequency of Social Gatherings with Friends and Family: Never    Attends Religious Services: Never    Marine scientist or Organizations: No    Attends Archivist Meetings: Never    Marital Status: Widowed  Intimate Partner Violence: Not At Risk (10/17/2022)   Humiliation, Afraid, Rape, and Kick questionnaire    Fear of Current or Ex-Partner: No    Emotionally Abused: No    Physically Abused: No    Sexually Abused: No    Past Surgical History:  Procedure Laterality Date   BACK SURGERY     BREAST LUMPECTOMY Left    CAROTID PTA/STENT INTERVENTION Right 09/19/2022   Procedure: CAROTID PTA/STENT INTERVENTION;  Surgeon: Algernon Huxley, MD;  Location: [facility];  Service: Cardiovascular;  Laterality: Right;   CATARACT EXTRACTION W/PHACO Left 08/14/2019   Procedure: CATARACT EXTRACTION PHACO AND INTRAOCULAR LENS PLACEMENT (IOC) LEFT 10.22 00:59.7 42.4%;  Surgeon: Leandrew Koyanagi, MD;  Location: [facility];  Service: Ophthalmology;  Laterality: Left;  leave it last patient per [place] Right 06/20/2018   Procedure: LOWER EXTREMITY ANGIOGRAPHY;  Surgeon: Algernon Huxley, MD;  Location: [place];  Service: Cardiovascular;  Laterality: Right;   LOWER EXTREMITY ANGIOGRAPHY Left 02/03/2020   Procedure: LOWER EXTREMITY ANGIOGRAPHY;  Surgeon: Algernon Huxley, MD;  Location: [place] [facility];  Service: Cardiovascular;  Laterality: Left;   right  eye surgery     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   TOTAL ABDOMINAL HYSTERECTOMY     total   WISDOM TOOTH EXTRACTION      Family History  Problem Relation Age of Onset   Intracerebral hemorrhage Mother    Hypertension Mother    Heart failure Father    Schizophrenia Daughter    Rheum arthritis Maternal Grandmother    Kidney disease Maternal Grandmother    Heart disease Paternal Grandmother    Stroke Paternal Grandmother    Stroke Paternal Grandfather    Heart disease Paternal Grandfather     Allergies  Allergen Reactions   Plavix [Clopidogrel] Other (See Comments)    States "I hurt so bad I couldn't get out of bed."   Other Itching    Cat Gut sutures    Oxycodone Hives   Amlodipine Hives   Augmentin [Amoxicillin-Pot Clavulanate] Diarrhea   Azithromycin Hives   Contrast Media [Iodinated Contrast Media] Hives   Doxycycline Diarrhea   Erythromycin Diarrhea   Hydrocodone Nausea Only   Lactose Intolerance (Gi) Diarrhea    Bloating   Minocycline Diarrhea   Nsaids Other (See Comments)    Internal Bleeding   Relafen [Nabumetone] Hives   Sulfa Antibiotics Hives   Synthroid [Levothyroxine] Diarrhea   Tetracyclines & Related Diarrhea   Tilactase    Tramadol Rash    Takes +/- Benadryl       Latest Ref Rng & Units 09/20/2022    5:28 AM 05/31/2022    3:17 PM 10/19/2021    1:29 PM  CBC  WBC 4.0 - 10.5 K/uL 23.8  11.1  10.7   Hemoglobin 12.0 - 15.0 g/dL 11.0  13.0  13.2   Hematocrit 36.0 - 46.0 % 33.2  39.6  39.6   Platelets 150 - 400 K/uL 370  469  444       CMP     Component Value Date/Time   NA  138 09/20/2022 0528   NA 141 05/31/2022 1517   NA 132 (L) 09/12/2012 1540   K 3.6 09/20/2022 0528   K 3.8 09/11/2012 0401   CL 108 09/20/2022 0528   CL 99 09/11/2012 0401   CO2 21 (L) 09/20/2022 0528   CO2 24 09/11/2012 0401   GLUCOSE 164 (H) 09/20/2022 0528   GLUCOSE 262 (H) 09/11/2012 0401   BUN 14 09/20/2022 0528   BUN 10 05/31/2022 1517   BUN 22 (H) 09/11/2012 0401   CREATININE 0.78 09/20/2022 0528   CREATININE 0.86 09/11/2012 0401   CALCIUM 8.3 (L) 09/20/2022 0528   CALCIUM 8.5 09/11/2012 0401   PROT 6.8 05/31/2022 1517   ALBUMIN 4.6 05/31/2022 1517   AST 16 05/31/2022 1517   ALT 9 05/31/2022 1517   ALKPHOS 71 05/31/2022 1517   BILITOT 0.2 05/31/2022 1517   GFRNONAA >60 09/20/2022 0528   GFRNONAA >60 09/11/2012 0401   GFRAA 90 09/10/2020 1409   GFRAA >60 09/11/2012 0401     No results found.     Assessment & Plan:   1. Bilateral carotid artery stenosis Recommend:  The patient is s/p successful right carotid stent  Duplex ultrasound  shows no  stenosis of the right ICA with 40 to 59% of the left ICA.  Continue antiplatelet therapy as prescribed Continue management of CAD, HTN and Hyperlipidemia Healthy heart diet,  encouraged exercise at least 4 times per week  The patient's NIHSS score is as follows: 1 Mild: 1 - 5 Mild to Moderately Severe:  5 - 14 Severe: 15 - 24 Very Severe: >25  Follow up in 6 months with duplex ultrasound and physical exam based on the patient's carotid intervention.  2. Essential hypertension Continue antihypertensive medications as already ordered, these medications have been reviewed and there are no changes at this time.   Current Outpatient Medications on File Prior to Visit  Medication Sig Dispense Refill   albuterol (VENTOLIN HFA) 108 (90 Base) MCG/ACT inhaler Inhale 2 puffs into the lungs every 6 (six) hours as needed for wheezing or shortness of breath. 8 g 6   aspirin EC 81 MG tablet Take 1 tablet (81 mg total) by mouth  daily at 6 (six) AM. Swallow whole. 30 tablet 12   atenolol (TENORMIN) 25 MG tablet Take 1 tablet (25 mg total) by mouth daily. 90 tablet 1   atorvastatin (LIPITOR) 10 MG tablet Take 1 tablet (10 mg total) by mouth daily. 30 tablet 11   B-COMPLEX-C PO Take 1 tablet by mouth daily.     benazepril (LOTENSIN) 10 MG tablet Take 1 tablet (10 mg total) by mouth daily. 90 tablet 1   Collagen-Vitamin C-Biotin (COLLAGEN 1500/C) 500-50-0.8 MG CAPS Take by mouth.     cyclobenzaprine (FLEXERIL) 5 MG tablet Take 1 tablet (5 mg total) by mouth 3 (three) times daily as needed for muscle spasms. 30 tablet 6   diphenhydrAMINE (BENADRYL) 25 MG tablet Take 25 mg by mouth every 6 (six) hours as needed.      fluticasone (FLONASE) 50 MCG/ACT nasal spray 2 sprays into each nostril in the morning. Up to three sprays.     gabapentin (NEURONTIN) 300 MG capsule Take 1 capsule (300 mg total) by mouth 2 (two) times daily. 180 capsule 6   hydrOXYzine (ATARAX) 25 MG tablet Take 1 tablet (25 mg total) by mouth 3 (three) times daily as needed. 270 tablet 3   lidocaine (LIDODERM) 5 % Place 1 patch onto the skin daily. Remove & Discard patch within 12 hours or as directed by MD 60 patch 6   Misc Natural Products (JOINT HEALTH PO) Take by mouth daily.     Multiple Vitamins-Minerals (MULTIVITAMIN WITH MINERALS) tablet Take 1 tablet by mouth daily.     NIACINAMIDE PO Take 1 capsule by mouth daily.     omeprazole (PRILOSEC) 20 MG capsule Take 1 capsule (20 mg total) by mouth daily. 90 capsule 1   thyroid (ARMOUR THYROID) 90 MG tablet Take 1 tablet (90 mg total) by mouth every other day. 90 tablet 3   ticagrelor (BRILINTA) 90 MG TABS tablet Take 1 tablet (90 mg total) by mouth 2 (two) times daily. 60 tablet 3   TRELEGY ELLIPTA 100-62.5-25 MCG/ACT AEPB Inhale 1 puff into the lungs daily for 1 day. 60 each 5   acetaminophen (TYLENOL) 500 MG tablet Take 500 mg by mouth every 6 (six) hours as needed. (Patient not taking: Reported on  10/17/2022)     montelukast (SINGULAIR) 10 MG tablet Take 1 tablet (10 mg total) by mouth at bedtime. (Patient not taking: Reported on 10/17/2022) 90 tablet 3   Current Facility-Administered Medications on File Prior to Visit  Medication Dose Route Frequency Provider Last Rate Last Admin   albuterol (PROVENTIL) (2.5 MG/3ML) 0.083% nebulizer solution 2.5 mg  2.5 mg Nebulization Once Johnson, Megan P, DO        There are no Patient Instructions on file for this visit. No follow-ups on file.   Kris Hartmann, NP

## 2022-10-27 ENCOUNTER — Ambulatory Visit (INDEPENDENT_AMBULATORY_CARE_PROVIDER_SITE_OTHER): Payer: Medicare Other

## 2022-10-27 ENCOUNTER — Ambulatory Visit
Admission: EM | Admit: 2022-10-27 | Discharge: 2022-10-27 | Disposition: A | Discharge status: Home / Self Care | Payer: Medicare Other | Attending: Emergency Medicine | Admitting: Emergency Medicine

## 2022-10-27 ENCOUNTER — Ambulatory Visit: Payer: Medicare Other

## 2022-10-27 ENCOUNTER — Other Ambulatory Visit: Payer: Self-pay

## 2022-10-27 ENCOUNTER — Encounter: Payer: Self-pay | Admitting: Emergency Medicine

## 2022-10-27 DIAGNOSIS — S83412A Sprain of medial collateral ligament of left knee, initial encounter: Secondary | ICD-10-CM

## 2022-10-27 DIAGNOSIS — M25562 Pain in left knee: Secondary | ICD-10-CM | POA: Diagnosis not present

## 2022-10-27 MED ORDER — HYDROCODONE-ACETAMINOPHEN 5-325 MG PO TABS: 2.0000 | ORAL_TABLET | ORAL | 0 refills | Status: DC | PRN

## 2022-10-27 MED ORDER — ONDANSETRON 4 MG PO TBDP: 4.0000 mg | ORAL_TABLET | Freq: Three times a day (TID) | ORAL | 0 refills | Status: DC | PRN

## 2022-10-27 NOTE — ED Provider Notes (Signed)
[facility]    CSN: [id number] Arrival date & time: 10/27/22  1606      History   Chief Complaint Chief Complaint  Patient presents with   Knee Pain    Pt states approx 1 wk ago she went to pivot on her left leg and suddenly starting having pain in left knee and pain has been getting progressively worse     HPI Savannah Mcclure is a 82 y.o. female.   HPI  82 year old female here for evaluation of left knee pain.  The patient has a past medical history that is significant for peripheral artery disease, spinal stenosis, COPD, essential hypertension, anemia, anxiety, dyslipidemia, carotid stenosis, and hypothyroidism presenting for evaluation of 1 week worth of pain in the inner aspect of her left knee.  She states that approximately 1 week ago she went to pivot and she felt a pain on the inside of her knee and has been present ever since.  The pain is usually better in the morning and increases when she gets up and bears weight.  She has been taking tramadol, which she is prescribed for her back, which has not been helping the pain.  She is also been taking Tylenol.  She denies any numbness, tingling, or swelling in her lower extremity.  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    COPD (chronic obstructive pulmonary disease) ([place])    Delirium 03/18/2012   Dyspnea    GERD (gastroesophageal reflux disease)    Heart murmur    Hypertension    Leaky heart valve    Oxygen deficit    uses O2 at night 2 liters   Peripheral vascular disease ([place])    Scoliosis    Sepsis ([place])    Shock ([place]) 03/23/2012   SIRS (systemic inflammatory response  syndrome) ([place]) 03/23/2012   Spinal stenosis    Stroke [facility])    Eye   Tachycardia     Patient Active Problem List   Diagnosis Date Noted   Carotid stenosis, right 09/19/2022   Chronic respiratory failure with hypoxia ([place]) 07/20/2021   Thrombocytosis    Multifocal pneumonia 07/11/2021   Hypomagnesemia    Senile purpura ([place]) 05/27/2021   Coronary artery disease of native artery of native heart with stable angina pectoris ([place]) 05/27/2021   Aortic atherosclerosis ([place]) 11/03/2020   Hypothyroidism 09/13/2020   Thoracic or lumbosacral neuritis or radiculitis, unspecified 02/25/2020   Atherosclerosis of native artery of both lower extremities with intermittent claudication ([place]) 10/15/2018   Spondylosis without myelopathy or radiculopathy, lumbar region 06/19/2018   Thoracic spondylosis without myelopathy 06/19/2018   Atherosclerosis of native arteries of extremity with intermittent claudication ([place]) 04/13/2018   Carotid stenosis 04/13/2018   Lumbar degenerative disc disease 12/19/2017   Chronic, continuous use of opioids 12/19/2017   Chronic left shoulder pain 12/19/2017   Chronic pain syndrome 12/19/2017   Idiopathic scoliosis and kyphoscoliosis 10/30/2017   PAD (peripheral artery disease) ([place]) 10/30/2017   Vision loss 10/30/2017   Sinus tachycardia 10/30/2017   Anxiety 10/30/2017   Anemia 10/30/2017   Hypokalemia  10/30/2017   Essential hypertension 10/07/2017   COPD exacerbation ([place]) 10/07/2017   Feeding problem in adult 07/25/2012   Gastrostomy in place [facility]) 07/25/2012   Hypoxemia 03/18/2012   Oliguria 03/18/2012   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   Degenerative joint disease 02/28/2012   Scoliosis 02/28/2012   History of tobacco abuse 07/25/2011   Dyslipidemia 07/25/2011   H/O chest pain 07/25/2011   Right bundle branch block 07/25/2011    Past Surgical History:  Procedure Laterality Date   BACK SURGERY     BREAST  LUMPECTOMY Left    CAROTID PTA/STENT INTERVENTION Right 09/19/2022   Procedure: CAROTID PTA/STENT INTERVENTION;  Surgeon: Algernon Huxley, MD;  Location: [place] [facility];  Service: Cardiovascular;  Laterality: Right;   CATARACT EXTRACTION W/PHACO Left 08/14/2019   Procedure: CATARACT EXTRACTION PHACO AND INTRAOCULAR LENS PLACEMENT (IOC) LEFT 10.22 00:59.7 42.4%;  Surgeon: Leandrew Koyanagi, MD;  Location: [place];  Service: Ophthalmology;  Laterality: Left;  leave it last patient per [place] Right 06/20/2018   Procedure: LOWER EXTREMITY ANGIOGRAPHY;  Surgeon: Algernon Huxley, MD;  Location: [place] [facility];  Service: Cardiovascular;  Laterality: Right;   LOWER EXTREMITY ANGIOGRAPHY Left 02/03/2020   Procedure: LOWER EXTREMITY ANGIOGRAPHY;  Surgeon: Algernon Huxley, MD;  Location: [place] [facility];  Service: Cardiovascular;  Laterality: Left;   right eye surgery     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      Home Medications    Prior to Admission medications   Medication Sig Start Date End Date Taking? Authorizing Provider  HYDROcodone-acetaminophen (NORCO/VICODIN) 5-325 MG tablet Take 2 tablets by mouth every 4 (four) hours as needed. 10/27/22  Yes Margarette Canada, NP  ondansetron (ZOFRAN-ODT) 4 MG disintegrating tablet Take 1 tablet (4 mg total) by mouth every 8 (eight) hours as needed for nausea or vomiting. 10/27/22  Yes Margarette Canada, NP  acetaminophen (TYLENOL) 500 MG tablet Take 500 mg by mouth every 6 (six) hours as needed. Patient not taking: Reported on 10/17/2022    [provider]  albuterol (VENTOLIN HFA) 108 (90 Base) MCG/ACT inhaler Inhale 2 puffs into the lungs every 6 (six) hours as needed for wheezing or shortness of breath. 05/31/22   Johnson, Megan P, DO  aspirin EC 81 MG tablet Take 1 tablet (81 mg total) by  mouth daily at 6 (six) AM. Swallow whole. 09/21/22   Pace, Cyndie Chime, NP  atenolol (TENORMIN) 25 MG tablet Take 1 tablet (25 mg total) by mouth daily. 05/31/22   Johnson, Megan P, DO  atorvastatin (LIPITOR) 10 MG tablet Take 1 tablet (10 mg total) by mouth daily. 08/30/22 08/30/23  Algernon Huxley, MD  B-COMPLEX-C PO Take 1 tablet by mouth daily.    [provider]  benazepril (LOTENSIN) 10 MG tablet Take 1 tablet (10 mg total) by mouth daily. 05/31/22   Johnson, Megan P, DO  Collagen-Vitamin C-Biotin (COLLAGEN 1500/C) 500-50-0.8 MG CAPS Take by mouth.    [provider]  cyclobenzaprine (FLEXERIL) 5 MG tablet Take 1 tablet (5 mg total) by mouth 3 (three) times daily as needed for muscle spasms. 10/19/21   Johnson, Megan P, DO  diphenhydrAMINE (BENADRYL) 25 MG tablet Take 25 mg by mouth every 6 (six) hours as needed.  [provider]  fluticasone (FLONASE) 50 MCG/ACT nasal spray 2 sprays into each nostril in the morning. Up to three sprays. 11/18/20   [provider]  gabapentin (NEURONTIN) 300 MG capsule Take 1 capsule (300 mg total) by mouth 2 (two) times daily. 05/31/22   Johnson, Megan P, DO  hydrOXYzine (ATARAX) 25 MG tablet Take 1 tablet (25 mg total) by mouth 3 (three) times daily as needed. 10/19/21   Johnson, Megan P, DO  lidocaine (LIDODERM) 5 % Place 1 patch onto the skin daily. Remove & Discard patch within 12 hours or as directed by MD 11/26/21   Gillis Santa, MD  Misc Natural Products (JOINT HEALTH PO) Take by mouth daily.    [provider]  montelukast (SINGULAIR) 10 MG tablet Take 1 tablet (10 mg total) by mouth at bedtime. Patient not taking: Reported on 10/17/2022 10/19/21   Park Liter P, DO  Multiple Vitamins-Minerals (MULTIVITAMIN WITH MINERALS) tablet Take 1 tablet by mouth daily.    [provider]  NIACINAMIDE PO Take 1 capsule by mouth daily.    [provider]  omeprazole (PRILOSEC) 20 MG capsule Take 1 capsule (20 mg  total) by mouth daily. 09/29/22   Johnson, Megan P, DO  thyroid (ARMOUR THYROID) 90 MG tablet Take 1 tablet (90 mg total) by mouth every other day. 07/22/22   Johnson, Megan P, DO  ticagrelor (BRILINTA) 90 MG TABS tablet Take 1 tablet (90 mg total) by mouth 2 (two) times daily. 09/20/22   Pace, Cyndie Chime, NP  TRELEGY ELLIPTA 100-62.5-25 MCG/ACT AEPB Inhale 1 puff into the lungs daily for 1 day. 02/10/22   Tyler Pita, MD    Family History Family History  Problem Relation Age of Onset   Intracerebral hemorrhage Mother    Hypertension Mother    Heart failure Father    Schizophrenia Daughter    Rheum arthritis Maternal Grandmother    Kidney disease Maternal Grandmother    Heart disease Paternal Grandmother    Stroke Paternal Grandmother    Stroke Paternal Grandfather    Heart disease Paternal Grandfather     Social History Social History   Tobacco Use   Smoking status: Every Day    Packs/day: 0.50    Years: 40.00    Additional pack years: 0.00    Total pack years: 20.00    Types: Cigarettes   Smokeless tobacco: Never   Tobacco comments:    0.5PPD 08/31/2021  Vaping Use   Vaping Use: Former  Substance Use Topics   Alcohol use: Yes    Comment: wine on occasion (1x/month)   Drug use: No     Allergies   Plavix [clopidogrel], Other, Oxycodone, Amlodipine, Augmentin [amoxicillin-pot clavulanate], Azithromycin, Contrast media [iodinated contrast media], Doxycycline, Erythromycin, Hydrocodone, Lactose intolerance (gi), Minocycline, Nsaids, Relafen [nabumetone], Sulfa antibiotics, Synthroid [levothyroxine], Tetracyclines & related, Tilactase, and Tramadol   Review of Systems Review of Systems  Musculoskeletal:  Positive for arthralgias. Negative for joint swelling.  Skin:  Negative for color change.  Neurological:  Negative for weakness and numbness.  Psychiatric/Behavioral: Negative.       Physical Exam Triage Vital Signs ED Triage Vitals  Enc Vitals Group     BP  10/27/22 1633 125/75     Pulse --      Resp 10/27/22 1633 20     Temp 10/27/22 1633 98.6 F (37 C)     Temp src --      SpO2 10/27/22 1633 95 %  Weight --      Height --      Head Circumference --      Peak Flow --      Pain Score 10/27/22 1629 4     Pain Loc --      Pain Edu? --      Excl. in [place]? --    No data found.  Updated Vital Signs BP 125/75   Temp 98.6 F (37 C)   Resp 20   SpO2 95%   Visual Acuity Right Eye Distance:   Left Eye Distance:   Bilateral Distance:    Right Eye Near:   Left Eye Near:    Bilateral Near:     Physical Exam Vitals and nursing note reviewed.  Constitutional:      Appearance: Normal appearance. She is not diaphoretic.  HENT:     Head: Normocephalic and atraumatic.  Musculoskeletal:        General: Tenderness present. No swelling or signs of injury.  Skin:    General: Skin is warm and dry.     Capillary Refill: Capillary refill takes less than 2 seconds.     Findings: No bruising or erythema.  Neurological:     Mental Status: She is alert.      UC Treatments / Results  Labs (all labs ordered are listed, but only abnormal results are displayed) Labs Reviewed - No data to display  EKG   Radiology DG Knee Complete 4 Views Left  Result Date: 10/27/2022 CLINICAL DATA:  Left knee pain, injury 1 week ago EXAM: LEFT KNEE - COMPLETE 4+ VIEW COMPARISON:  None Available. FINDINGS: Frontal, bilateral oblique, lateral views of the left knee are obtained. No acute fracture, subluxation, or dislocation. Mild 3 compartmental joint space narrowing consistent with osteoarthritis. No joint effusion. The soft tissues are unremarkable. IMPRESSION: 1. Mild 3 compartmental osteoarthritis. 2. No acute bony abnormality. Electronically Signed   By: Randa Ngo M.D.   On: 10/27/2022 17:00    Procedures Procedures (including critical care time)  Medications Ordered in UC Medications - No data to display  Initial Impression / Assessment  and Plan / UC Course  I have reviewed the triage vital signs and the nursing notes.  Pertinent labs & imaging results that were available during my care of the patient were reviewed by me and considered in my medical decision making (see chart for details).   The patient is a pleasant, nontoxic-appearing 82 year old female with a significant orthopedic history presenting for evaluation of 1 week worth of pain to the inner aspect of her left knee.  On exam patient does have a knee that is in normal anatomical alignment and free of edema, ecchymosis, or erythema.  There is no tenderness with palpation of the patella, tibial tuberosity, lateral joint line, quadriceps complex, or popliteal fossa.  She does have tenderness with palpation of the medial collateral ligament as well and has pain with valgus stress application but not varus stress application.  I suspect that the patient has a sprain of her medial collateral ligament.  I will obtain radiograph to rule out any bony abnormality.  Allergy impression of left knee series states there is mild 3 compartmental osteoarthritis but no acute bony abnormality.  I will discharge patient home with a diagnosis of medial collateral ligament sprain and have her fitted with a knee brace for support.  She has a significant number of medication allergies and she has taken Norco in the past but does cause  nausea.  She states that tramadol is not working for her pain.  I will give her short prescription of Norco along with some Zofran that she can take before hand to help with more severe pain.  Final Clinical Impressions(s) / UC Diagnoses   Final diagnoses:  Sprain of medial collateral ligament of left knee, initial encounter     Discharge Instructions      Your x-ray did not demonstrate any broken bones but did show significant osteoarthritis.  As we discussed, I believe you have sprained your knee and this involves stretching of the soft tissues and might  take a a while to heal.  Wear the hinged knee brace whenever you are up and moving to provide support your knee and prevent further injury, as well as to aid in pain relief.  You may continue to take over-the-counter Tylenol as needed for mild to moderate pain.  I am going to prescribe Norco that you can use for more severe pain.  This medication also has Tylenol in it so make sure that you do not take more than 4000 mg of Tylenol in a 24-hour period.  Also do not drink alcohol or drive if you take this medication.  Do not take the [place] with your tramadol as they are similar medications and could cause you to become too sleepy or overdose.  If your pain does not improve, or worsens, I recommend following up with your primary care provider or with orthopedics.     ED Prescriptions     Medication Sig Dispense Auth. Provider   ondansetron (ZOFRAN-ODT) 4 MG disintegrating tablet Take 1 tablet (4 mg total) by mouth every 8 (eight) hours as needed for nausea or vomiting. 20 tablet Margarette Canada, NP   HYDROcodone-acetaminophen (NORCO/VICODIN) 5-325 MG tablet Take 2 tablets by mouth every 4 (four) hours as needed. 12 tablet Margarette Canada, NP      I have reviewed the PDMP during this encounter.   Margarette Canada, NP 10/27/22 [phone]

## 2022-10-27 NOTE — Discharge Instructions (Addendum)
Your x-ray did not demonstrate any broken bones but did show significant osteoarthritis.  As we discussed, I believe you have sprained your knee and this involves stretching of the soft tissues and might take a a while to heal.  Wear the hinged knee brace whenever you are up and moving to provide support your knee and prevent further injury, as well as to aid in pain relief.  You may continue to take over-the-counter Tylenol as needed for mild to moderate pain.  I am going to prescribe Norco that you can use for more severe pain.  This medication also has Tylenol in it so make sure that you do not take more than 4000 mg of Tylenol in a 24-hour period.  Also do not drink alcohol or drive if you take this medication.  Do not take the [place] with your tramadol as they are similar medications and could cause you to become too sleepy or overdose.  If your pain does not improve, or worsens, I recommend following up with your primary care provider or with orthopedics.

## 2022-10-27 NOTE — ED Triage Notes (Signed)
Pt states approx 1 wk ago she went to pivot on her left leg heard a pop knee gave away (did not land on knee) afterwards starting having pain in left knee and pain has been getting progressively worse. Nothing is helping pain. Takes tramadol for back pain on a daily basis and has not helped pain

## 2022-11-15 ENCOUNTER — Other Ambulatory Visit (INDEPENDENT_AMBULATORY_CARE_PROVIDER_SITE_OTHER): Payer: Self-pay

## 2022-11-15 MED ORDER — TICAGRELOR 90 MG PO TABS: 90.0000 mg | ORAL_TABLET | Freq: Two times a day (BID) | ORAL | 3 refills | Status: DC

## 2022-11-16 DIAGNOSIS — D485 Neoplasm of uncertain behavior of skin: Secondary | ICD-10-CM | POA: Diagnosis not present

## 2022-11-16 DIAGNOSIS — L298 Other pruritus: Secondary | ICD-10-CM | POA: Diagnosis not present

## 2022-11-16 DIAGNOSIS — L578 Other skin changes due to chronic exposure to nonionizing radiation: Secondary | ICD-10-CM | POA: Diagnosis not present

## 2022-11-16 DIAGNOSIS — Z872 Personal history of diseases of the skin and subcutaneous tissue: Secondary | ICD-10-CM | POA: Diagnosis not present

## 2022-11-16 DIAGNOSIS — Z86018 Personal history of other benign neoplasm: Secondary | ICD-10-CM | POA: Diagnosis not present

## 2022-11-16 DIAGNOSIS — L821 Other seborrheic keratosis: Secondary | ICD-10-CM | POA: Diagnosis not present

## 2022-11-17 DIAGNOSIS — D2262 Melanocytic nevi of left upper limb, including shoulder: Secondary | ICD-10-CM | POA: Diagnosis not present

## 2022-11-29 ENCOUNTER — Ambulatory Visit (INDEPENDENT_AMBULATORY_CARE_PROVIDER_SITE_OTHER): Payer: Medicare Other | Admitting: Family Medicine

## 2022-11-29 ENCOUNTER — Encounter: Payer: Self-pay | Admitting: Family Medicine

## 2022-11-29 VITALS — BP 129/74 | HR 99 | Temp 97.5°F | Wt 115.4 lb

## 2022-11-29 DIAGNOSIS — M47814 Spondylosis without myelopathy or radiculopathy, thoracic region: Secondary | ICD-10-CM

## 2022-11-29 DIAGNOSIS — I700 Atherosclerosis of aorta: Secondary | ICD-10-CM | POA: Diagnosis not present

## 2022-11-29 DIAGNOSIS — D692 Other nonthrombocytopenic purpura: Secondary | ICD-10-CM | POA: Diagnosis not present

## 2022-11-29 DIAGNOSIS — I10 Essential (primary) hypertension: Secondary | ICD-10-CM

## 2022-11-29 DIAGNOSIS — I70213 Atherosclerosis of native arteries of extremities with intermittent claudication, bilateral legs: Secondary | ICD-10-CM

## 2022-11-29 DIAGNOSIS — M4120 Other idiopathic scoliosis, site unspecified: Secondary | ICD-10-CM

## 2022-11-29 DIAGNOSIS — D649 Anemia, unspecified: Secondary | ICD-10-CM

## 2022-11-29 DIAGNOSIS — E785 Hyperlipidemia, unspecified: Secondary | ICD-10-CM

## 2022-11-29 DIAGNOSIS — G894 Chronic pain syndrome: Secondary | ICD-10-CM

## 2022-11-29 DIAGNOSIS — I25118 Atherosclerotic heart disease of native coronary artery with other forms of angina pectoris: Secondary | ICD-10-CM

## 2022-11-29 DIAGNOSIS — E039 Hypothyroidism, unspecified: Secondary | ICD-10-CM

## 2022-11-29 DIAGNOSIS — F419 Anxiety disorder, unspecified: Secondary | ICD-10-CM

## 2022-11-29 DIAGNOSIS — I6521 Occlusion and stenosis of right carotid artery: Secondary | ICD-10-CM

## 2022-11-29 DIAGNOSIS — J410 Simple chronic bronchitis: Secondary | ICD-10-CM | POA: Diagnosis not present

## 2022-11-29 MED ORDER — ATENOLOL 25 MG PO TABS: 25.0000 mg | ORAL_TABLET | Freq: Every day | ORAL | 1 refills | Status: DC

## 2022-11-29 MED ORDER — GABAPENTIN 300 MG PO CAPS: 300.0000 mg | ORAL_CAPSULE | Freq: Two times a day (BID) | ORAL | 6 refills | Status: DC

## 2022-11-29 MED ORDER — BENAZEPRIL HCL 10 MG PO TABS: 10.0000 mg | ORAL_TABLET | Freq: Every day | ORAL | 1 refills | Status: DC

## 2022-11-29 MED ORDER — ASPIRIN 81 MG PO TBEC: 81.0000 mg | DELAYED_RELEASE_TABLET | Freq: Every day | ORAL | 12 refills | Status: DC

## 2022-11-29 MED ORDER — TRELEGY ELLIPTA 100-62.5-25 MCG/ACT IN AEPB: INHALATION_SPRAY | RESPIRATORY_TRACT | 6 refills | Status: DC

## 2022-11-29 MED ORDER — OMEPRAZOLE 20 MG PO CPDR: 20.0000 mg | DELAYED_RELEASE_CAPSULE | Freq: Every day | ORAL | 1 refills | Status: DC

## 2022-11-29 MED ORDER — ROSUVASTATIN CALCIUM 5 MG PO TABS: 5.0000 mg | ORAL_TABLET | Freq: Every day | ORAL | 3 refills | Status: DC

## 2022-11-29 MED ORDER — HYDROXYZINE HCL 25 MG PO TABS: 25.0000 mg | ORAL_TABLET | Freq: Three times a day (TID) | ORAL | 3 refills | Status: DC | PRN

## 2022-11-29 MED ORDER — ALBUTEROL SULFATE HFA 108 (90 BASE) MCG/ACT IN AERS: 2.0000 | INHALATION_SPRAY | Freq: Four times a day (QID) | RESPIRATORY_TRACT | 6 refills | Status: DC | PRN

## 2022-11-29 NOTE — Progress Notes (Unsigned)
BP 129/74   Pulse 99   Temp (!) 97.5 F (36.4 C) (Oral)   Wt 115 lb 6.4 oz (52.3 kg)   SpO2 97%   BMI 20.44 kg/m    Subjective:    Patient ID: Savannah Mcclure, female    DOB: May 12, 1941, 82 y.o.   MRN: [id number]  HPI: Savannah Mcclure is a 82 y.o. female  Chief Complaint  Patient presents with   Hypothyroidism   HYPERTENSION / HYPERLIPIDEMIA Satisfied with current treatment? yes Duration of hypertension: chronic BP monitoring frequency: not checking BP medication side effects: no Past BP meds: benazepril, atenolol Duration of hyperlipidemia: chronic Cholesterol medication side effects: yes- weakness, muscle aching Cholesterol supplements: none Past cholesterol medications: atorvastatin Medication compliance: good compliance Aspirin: yes Recent stressors: no Recurrent headaches: no Visual changes: no Palpitations: no Dyspnea: yes Chest pain: no Lower extremity edema: no Dizzy/lightheaded: no  HYPOTHYROIDISM Thyroid control status:stable Satisfied with current treatment? yes Medication side effects: no Medication compliance: excellent compliance Recent dose adjustment:yes Fatigue: yes Cold intolerance: no Heat intolerance: no Weight gain: no Weight loss: no Constipation: no Diarrhea/loose stools: no Palpitations: no Lower extremity edema: no Anxiety/depressed mood: no   Relevant past medical, surgical, family and social history reviewed and updated as indicated. Interim medical history since our last visit reviewed. Allergies and medications reviewed and updated.  Review of Systems  Constitutional: Negative.   Respiratory: Negative.    Cardiovascular: Negative.   Gastrointestinal: Negative.   Musculoskeletal:  Positive for myalgias. Negative for arthralgias, back pain, gait problem, joint swelling, neck pain and neck stiffness.  Neurological:  Positive for weakness. Negative for dizziness, tremors, seizures, syncope, facial  asymmetry, speech difficulty, light-headedness, numbness and headaches.  Psychiatric/Behavioral: Negative.      Per HPI unless specifically indicated above     Objective:    BP 129/74   Pulse 99   Temp (!) 97.5 F (36.4 C) (Oral)   Wt 115 lb 6.4 oz (52.3 kg)   SpO2 97%   BMI 20.44 kg/m   Wt Readings from Last 3 Encounters:  11/29/22 115 lb 6.4 oz (52.3 kg)  10/17/22 112 lb (50.8 kg)  09/19/22 115 lb (52.2 kg)    Physical Exam Vitals and nursing note reviewed.  Constitutional:      General: She is not in acute distress.    Appearance: Normal appearance. She is not ill-appearing, toxic-appearing or diaphoretic.  HENT:     Head: Normocephalic and atraumatic.     Right Ear: External ear normal.     Left Ear: External ear normal.     Nose: Nose normal.     Mouth/Throat:     Mouth: Mucous membranes are moist.     Pharynx: Oropharynx is clear.  Eyes:     General: No scleral icterus.       Right eye: No discharge.        Left eye: No discharge.     Extraocular Movements: Extraocular movements intact.     Conjunctiva/sclera: Conjunctivae normal.     Pupils: Pupils are equal, round, and reactive to light.  Cardiovascular:     Rate and Rhythm: Normal rate and regular rhythm.     Pulses: Normal pulses.     Heart sounds: Normal heart sounds. No murmur heard.    No friction rub. No gallop.  Pulmonary:     Effort: Pulmonary effort is normal. No respiratory distress.     Breath sounds: Normal breath sounds. No stridor. No wheezing, rhonchi  or rales.  Chest:     Chest wall: No tenderness.  Musculoskeletal:        General: Normal range of motion.     Cervical back: Normal range of motion and neck supple.  Skin:    General: Skin is warm and dry.     Capillary Refill: Capillary refill takes less than 2 seconds.     Coloration: Skin is not jaundiced or pale.     Findings: No bruising, erythema, lesion or rash.  Neurological:     General: No focal deficit present.     Mental  Status: She is alert and oriented to person, place, and time. Mental status is at baseline.  Psychiatric:        Mood and Affect: Mood normal.        Behavior: Behavior normal.        Thought Content: Thought content normal.        Judgment: Judgment normal.     Results for orders placed or performed in visit on 11/29/22  CBC with Differential/Platelet  Result Value Ref Range   WBC 14.5 (H) 3.4 - 10.8 x10E3/uL   RBC 4.49 3.77 - 5.28 x10E6/uL   Hemoglobin 13.3 11.1 - 15.9 g/dL   Hematocrit 16.1 09.6 - 46.6 %   MCV 92 79 - 97 fL   MCH 29.6 26.6 - 33.0 pg   MCHC 32.4 31.5 - 35.7 g/dL   RDW 04.5 40.9 - 81.1 %   Platelets 403 150 - 450 x10E3/uL   Neutrophils 74 Not Estab. %   Lymphs 18 Not Estab. %   Monocytes 6 Not Estab. %   Eos 2 Not Estab. %   Basos 0 Not Estab. %   Neutrophils Absolute 10.6 (H) 1.4 - 7.0 x10E3/uL   Lymphocytes Absolute 2.6 0.7 - 3.1 x10E3/uL   Monocytes Absolute 0.9 0.1 - 0.9 x10E3/uL   EOS (ABSOLUTE) 0.3 0.0 - 0.4 x10E3/uL   Basophils Absolute 0.1 0.0 - 0.2 x10E3/uL   Immature Granulocytes 0 Not Estab. %   Immature Grans (Abs) 0.0 0.0 - 0.1 x10E3/uL  Comprehensive metabolic panel  Result Value Ref Range   Glucose 155 (H) 70 - 99 mg/dL   BUN 21 8 - 27 mg/dL   Creatinine, Ser 9.14 (H) 0.57 - 1.00 mg/dL   eGFR 49 (L) >78 GN/FAO/1.30   BUN/Creatinine Ratio 19 12 - 28   Sodium 140 134 - 144 mmol/L   Potassium 4.1 3.5 - 5.2 mmol/L   Chloride 99 96 - 106 mmol/L   CO2 20 20 - 29 mmol/L   Calcium 9.4 8.7 - 10.3 mg/dL   Total Protein 6.7 6.0 - 8.5 g/dL   Albumin 4.4 3.7 - 4.7 g/dL   Globulin, Total 2.3 1.5 - 4.5 g/dL   Albumin/Globulin Ratio 1.9 1.2 - 2.2   Bilirubin Total 0.4 0.0 - 1.2 mg/dL   Alkaline Phosphatase 81 44 - 121 IU/L   AST 22 0 - 40 IU/L   ALT 17 0 - 32 IU/L  Lipid Panel w/o Chol/HDL Ratio  Result Value Ref Range   Cholesterol, Total 121 100 - 199 mg/dL   Triglycerides 865 (H) 0 - 149 mg/dL   HDL 29 (L) >78 mg/dL   VLDL Cholesterol Cal  37 5 - 40 mg/dL   LDL Chol Calc (NIH) 55 0 - 99 mg/dL  TSH  Result Value Ref Range   TSH 0.742 0.450 - 4.500 uIU/mL  Magnesium  Result Value Ref Range  Magnesium 1.6 1.6 - 2.3 mg/dL      Assessment & Plan:   Problem List Items Addressed This Visit       Cardiovascular and Mediastinum   Essential hypertension    Under good control on current regimen. Continue current regimen. Continue to monitor. Call with any concerns. Refills given. Labs drawn today.       Relevant Medications   rosuvastatin (CRESTOR) 5 MG tablet   aspirin EC 81 MG tablet   atenolol (TENORMIN) 25 MG tablet   benazepril (LOTENSIN) 10 MG tablet   Other Relevant Orders   CBC with Differential/Platelet (Completed)   Comprehensive metabolic panel (Completed)   Urinalysis, Routine w reflex microscopic   Microalbumin, Urine Waived   Atherosclerosis of native artery of both lower extremities with intermittent claudication    Will keep BP and cholesterol under good control. Continue to monitor. Call with any concerns.       Relevant Medications   traMADol (ULTRAM) 50 MG tablet   rosuvastatin (CRESTOR) 5 MG tablet   aspirin EC 81 MG tablet   atenolol (TENORMIN) 25 MG tablet   benazepril (LOTENSIN) 10 MG tablet   gabapentin (NEURONTIN) 300 MG capsule   Other Relevant Orders   CBC with Differential/Platelet (Completed)   Comprehensive metabolic panel (Completed)   Lipid Panel w/o Chol/HDL Ratio (Completed)   Aortic atherosclerosis    Will keep BP and cholesterol under good control. Continue to monitor. Call with any concerns.       Relevant Medications   rosuvastatin (CRESTOR) 5 MG tablet   aspirin EC 81 MG tablet   atenolol (TENORMIN) 25 MG tablet   benazepril (LOTENSIN) 10 MG tablet   Other Relevant Orders   CBC with Differential/Platelet (Completed)   Comprehensive metabolic panel (Completed)   Lipid Panel w/o Chol/HDL Ratio (Completed)   Senile purpura    Reassured patient. Continue to monitor.        Relevant Medications   rosuvastatin (CRESTOR) 5 MG tablet   aspirin EC 81 MG tablet   atenolol (TENORMIN) 25 MG tablet   benazepril (LOTENSIN) 10 MG tablet   Other Relevant Orders   CBC with Differential/Platelet (Completed)   Comprehensive metabolic panel (Completed)   Coronary artery disease of native artery of native heart with stable angina pectoris    Will keep BP and cholesterol under good control. Continue to monitor. Call with any concerns.       Relevant Medications   traMADol (ULTRAM) 50 MG tablet   rosuvastatin (CRESTOR) 5 MG tablet   aspirin EC 81 MG tablet   atenolol (TENORMIN) 25 MG tablet   benazepril (LOTENSIN) 10 MG tablet   gabapentin (NEURONTIN) 300 MG capsule   Other Relevant Orders   CBC with Differential/Platelet (Completed)   Comprehensive metabolic panel (Completed)   Carotid stenosis, right    Will keep BP and cholesterol under good control. Continue to monitor. Call with any concerns.       Relevant Medications   rosuvastatin (CRESTOR) 5 MG tablet   aspirin EC 81 MG tablet   atenolol (TENORMIN) 25 MG tablet   benazepril (LOTENSIN) 10 MG tablet     Respiratory   COPD (chronic obstructive pulmonary disease)    Under good control on current regimen. Continue current regimen. Continue to monitor. Call with any concerns. Refills given. Labs drawn today.        Relevant Medications   albuterol (VENTOLIN HFA) 108 (90 Base) MCG/ACT inhaler   Fluticasone-Umeclidin-Vilant (TRELEGY ELLIPTA) 100-62.5-25 MCG/ACT  AEPB   Other Relevant Orders   CBC with Differential/Platelet (Completed)   Comprehensive metabolic panel (Completed)     Endocrine   Hypothyroidism - Primary    Rechecking labs today. Await results. Treat as needed.       Relevant Medications   atenolol (TENORMIN) 25 MG tablet   Other Relevant Orders   TSH (Completed)     Musculoskeletal and Integument   Idiopathic scoliosis and kyphoscoliosis    Stable. Continue to follow with  pain management. Call with any concerns.       Relevant Medications   gabapentin (NEURONTIN) 300 MG capsule   Thoracic spondylosis without myelopathy    Stable. Continue to follow with pain management. Call with any concerns.       Relevant Medications   traMADol (ULTRAM) 50 MG tablet   aspirin EC 81 MG tablet   gabapentin (NEURONTIN) 300 MG capsule     Other   Anxiety    Under good control on current regimen. Continue current regimen. Continue to monitor. Call with any concerns. Refills given. Labs drawn today.        Relevant Medications   hydrOXYzine (ATARAX) 25 MG tablet   Anemia    Rechecking labs today. Await results. Treat as needed.       Relevant Orders   CBC with Differential/Platelet (Completed)   Comprehensive metabolic panel (Completed)   Chronic pain syndrome    Continue to follow with pain management. Continue to monitor. Call with any concerns.       Relevant Medications   traMADol (ULTRAM) 50 MG tablet   aspirin EC 81 MG tablet   gabapentin (NEURONTIN) 300 MG capsule   Dyslipidemia    Under good control on current regimen. Continue current regimen. Continue to monitor. Call with any concerns. Refills given. Labs drawn today.       Relevant Medications   rosuvastatin (CRESTOR) 5 MG tablet   Hypomagnesemia    Rechecking labs today. Await results. Treat as needed.       Relevant Orders   CBC with Differential/Platelet (Completed)   Comprehensive metabolic panel (Completed)   Magnesium (Completed)     Follow up plan: Return in about 4 weeks (around 12/27/2022) for follow up cholesterol.

## 2022-11-30 LAB — COMPREHENSIVE METABOLIC PANEL
ALT: 17 IU/L (ref 0–32)
AST: 22 IU/L (ref 0–40)
Albumin/Globulin Ratio: 1.9 (ref 1.2–2.2)
Albumin: 4.4 g/dL (ref 3.7–4.7)
Alkaline Phosphatase: 81 IU/L (ref 44–121)
BUN/Creatinine Ratio: 19 (ref 12–28)
BUN: 21 mg/dL (ref 8–27)
Bilirubin Total: 0.4 mg/dL (ref 0.0–1.2)
CO2: 20 mmol/L (ref 20–29)
Calcium: 9.4 mg/dL (ref 8.7–10.3)
Chloride: 99 mmol/L (ref 96–106)
Creatinine, Ser: 1.13 mg/dL — ABNORMAL HIGH (ref 0.57–1.00)
Globulin, Total: 2.3 g/dL (ref 1.5–4.5)
Glucose: 155 mg/dL — ABNORMAL HIGH (ref 70–99)
Potassium: 4.1 mmol/L (ref 3.5–5.2)
Sodium: 140 mmol/L (ref 134–144)
Total Protein: 6.7 g/dL (ref 6.0–8.5)
eGFR: 49 mL/min/{1.73_m2} — ABNORMAL LOW (ref >59)

## 2022-11-30 LAB — CBC WITH DIFFERENTIAL/PLATELET
Basophils Absolute: 0.1 10*3/uL (ref 0.0–0.2)
Basos: 0 %
EOS (ABSOLUTE): 0.3 10*3/uL (ref 0.0–0.4)
Eos: 2 %
Hematocrit: 41.1 % (ref 34.0–46.6)
Hemoglobin: 13.3 g/dL (ref 11.1–15.9)
Immature Grans (Abs): 0 10*3/uL (ref 0.0–0.1)
Immature Granulocytes: 0 %
Lymphocytes Absolute: 2.6 10*3/uL (ref 0.7–3.1)
Lymphs: 18 %
MCH: 29.6 pg (ref 26.6–33.0)
MCHC: 32.4 g/dL (ref 31.5–35.7)
MCV: 92 fL (ref 79–97)
Monocytes Absolute: 0.9 10*3/uL (ref 0.1–0.9)
Monocytes: 6 %
Neutrophils Absolute: 10.6 10*3/uL — ABNORMAL HIGH (ref 1.4–7.0)
Neutrophils: 74 %
Platelets: 403 10*3/uL (ref 150–450)
RBC: 4.49 x10E6/uL (ref 3.77–5.28)
RDW: 13.4 % (ref 11.7–15.4)
WBC: 14.5 10*3/uL — ABNORMAL HIGH (ref 3.4–10.8)

## 2022-11-30 LAB — TSH: TSH: 0.742 u[IU]/mL (ref 0.450–4.500)

## 2022-11-30 LAB — LIPID PANEL W/O CHOL/HDL RATIO
Cholesterol, Total: 121 mg/dL (ref 100–199)
HDL: 29 mg/dL — ABNORMAL LOW (ref >39)
LDL Chol Calc (NIH): 55 mg/dL (ref 0–99)
Triglycerides: 232 mg/dL — ABNORMAL HIGH (ref 0–149)
VLDL Cholesterol Cal: 37 mg/dL (ref 5–40)

## 2022-11-30 LAB — MAGNESIUM: Magnesium: 1.6 mg/dL (ref 1.6–2.3)

## 2022-12-01 NOTE — Assessment & Plan Note (Signed)
Under good control on current regimen. Continue current regimen. Continue to monitor. Call with any concerns. Refills given. Labs drawn today.   

## 2022-12-01 NOTE — Assessment & Plan Note (Signed)
Rechecking labs today. Await results. Treat as needed.  °

## 2022-12-01 NOTE — Assessment & Plan Note (Signed)
Will keep BP and cholesterol under good control. Continue to monitor. Call with any concerns.  

## 2022-12-01 NOTE — Assessment & Plan Note (Signed)
Stable. Continue to follow with pain management. Call with any concerns.  ?

## 2022-12-01 NOTE — Assessment & Plan Note (Signed)
Continue to follow with pain management. Continue to monitor. Call with any concerns. 

## 2022-12-01 NOTE — Assessment & Plan Note (Signed)
Reassured patient. Continue to monitor.  

## 2023-01-10 ENCOUNTER — Encounter: Payer: Self-pay | Admitting: Family Medicine

## 2023-01-10 ENCOUNTER — Ambulatory Visit (INDEPENDENT_AMBULATORY_CARE_PROVIDER_SITE_OTHER): Payer: Medicare Other | Admitting: Family Medicine

## 2023-01-10 VITALS — BP 136/60 | HR 70 | Temp 98.3°F | Ht 62.0 in | Wt 118.4 lb

## 2023-01-10 DIAGNOSIS — L918 Other hypertrophic disorders of the skin: Secondary | ICD-10-CM

## 2023-01-10 DIAGNOSIS — E039 Hypothyroidism, unspecified: Secondary | ICD-10-CM

## 2023-01-10 DIAGNOSIS — I700 Atherosclerosis of aorta: Secondary | ICD-10-CM

## 2023-01-10 MED ORDER — ROSUVASTATIN CALCIUM 5 MG PO TABS: 5.0000 mg | ORAL_TABLET | Freq: Every day | ORAL | 1 refills | Status: DC

## 2023-01-10 NOTE — Progress Notes (Signed)
BP 136/60   Pulse 70   Temp 98.3 F (36.8 C) (Oral)   Ht 5\' 2"  (1.575 m)   Wt 118 lb 6.4 oz (53.7 kg)   SpO2 97%   BMI 21.66 kg/m    Subjective:    Patient ID: [place] Savannah Mcclure, female    DOB: July 21, 1941, 82 y.o.   MRN: [id number]  HPI: [place] Savannah Mcclure is [place] 82 y.o. female  Chief Complaint  Patient presents with   Eye Problem    Patient says she has something growing on her eye and not sure if she needs to see an Dermatologist or her eye doctor. Patient says she would like provider to take Savannah look at today's visit.    Small skin tag on corner of L eye.   HYPOTHYROIDISM Thyroid control status:controlled Satisfied with current treatment? yes Medication side effects: no Medication compliance: excellent compliance Recent dose adjustment:yes Fatigue: yes Cold intolerance: no Heat intolerance: no Weight gain: no Weight loss: no Constipation: no Diarrhea/loose stools: no Palpitations: no Lower extremity edema: no Anxiety/depressed mood: no  HYPERLIPIDEMIA Hyperlipidemia status: excellent compliance Satisfied with current treatment?  yes Side effects:  no Medication compliance: excellent compliance Past cholesterol meds: crestor Supplements: none Aspirin:  no The ASCVD Risk score (Arnett DK, et al., 2019) failed to calculate for the following reasons:   The 2019 ASCVD risk score is only valid for ages 39 to 25   The patient has Savannah prior MI or stroke diagnosis Chest pain:  no Coronary artery disease:  yes  Relevant past medical, surgical, family and social history reviewed and updated as indicated. Interim medical history since our last visit reviewed. Allergies and medications reviewed and updated.  Review of Systems  Constitutional: Negative.   Respiratory: Negative.    Cardiovascular: Negative.   Gastrointestinal: Negative.   Musculoskeletal: Negative.   Neurological: Negative.   Psychiatric/Behavioral: Negative.      Per HPI unless  specifically indicated above     Objective:    BP 136/60   Pulse 70   Temp 98.3 F (36.8 C) (Oral)   Ht 5\' 2"  (1.575 m)   Wt 118 lb 6.4 oz (53.7 kg)   SpO2 97%   BMI 21.66 kg/m   Wt Readings from Last 3 Encounters:  01/10/23 118 lb 6.4 oz (53.7 kg)  11/29/22 115 lb 6.4 oz (52.3 kg)  10/17/22 112 lb (50.8 kg)    Physical Exam Vitals and nursing note reviewed.  Constitutional:      General: She is not in acute distress.    Appearance: Normal appearance. She is not ill-appearing, toxic-appearing or diaphoretic.  HENT:     Head: Normocephalic and atraumatic.     Right Ear: External ear normal.     Left Ear: External ear normal.     Nose: Nose normal.     Mouth/Throat:     Mouth: Mucous membranes are moist.     Pharynx: Oropharynx is clear.  Eyes:     General: No scleral icterus.       Right eye: No discharge.        Left eye: No discharge.     Extraocular Movements: Extraocular movements intact.     Conjunctiva/sclera: Conjunctivae normal.     Pupils: Pupils are equal, round, and reactive to light.  Cardiovascular:     Rate and Rhythm: Normal rate and regular rhythm.     Pulses: Normal pulses.     Heart sounds: Normal heart sounds. No murmur  heard.    No friction rub. No gallop.  Pulmonary:     Effort: Pulmonary effort is normal. No respiratory distress.     Breath sounds: Normal breath sounds. No stridor. No wheezing, rhonchi or rales.  Chest:     Chest wall: No tenderness.  Musculoskeletal:        General: Normal range of motion.     Cervical back: Normal range of motion and neck supple.  Skin:    General: Skin is warm and dry.     Capillary Refill: Capillary refill takes less than 2 seconds.     Coloration: Skin is not jaundiced or pale.     Findings: No bruising, erythema, lesion or rash.  Neurological:     General: No focal deficit present.     Mental Status: She is alert and oriented to person, place, and time. Mental status is at baseline.  Psychiatric:         Mood and Affect: Mood normal.        Behavior: Behavior normal.        Thought Content: Thought content normal.        Judgment: Judgment normal.     Results for orders placed or performed in visit on 11/29/22  CBC with Differential/Platelet  Result Value Ref Range   WBC 14.5 (H) 3.4 - 10.8 x10E3/uL   RBC 4.49 3.77 - 5.28 x10E6/uL   Hemoglobin 13.3 11.1 - 15.9 g/dL   Hematocrit 11.9 14.7 - 46.6 %   MCV 92 79 - 97 fL   MCH 29.6 26.6 - 33.0 pg   MCHC 32.4 31.5 - 35.7 g/dL   RDW 82.9 56.2 - 13.0 %   Platelets 403 150 - 450 x10E3/uL   Neutrophils 74 Not Estab. %   Lymphs 18 Not Estab. %   Monocytes 6 Not Estab. %   Eos 2 Not Estab. %   Basos 0 Not Estab. %   Neutrophils Absolute 10.6 (H) 1.4 - 7.0 x10E3/uL   Lymphocytes Absolute 2.6 0.7 - 3.1 x10E3/uL   Monocytes Absolute 0.9 0.1 - 0.9 x10E3/uL   EOS (ABSOLUTE) 0.3 0.0 - 0.4 x10E3/uL   Basophils Absolute 0.1 0.0 - 0.2 x10E3/uL   Immature Granulocytes 0 Not Estab. %   Immature Grans (Abs) 0.0 0.0 - 0.1 x10E3/uL  Comprehensive metabolic panel  Result Value Ref Range   Glucose 155 (H) 70 - 99 mg/dL   BUN 21 8 - 27 mg/dL   Creatinine, Ser 8.65 (H) 0.57 - 1.00 mg/dL   eGFR 49 (L) >78 IO/NGE/9.52   BUN/Creatinine Ratio 19 12 - 28   Sodium 140 134 - 144 mmol/L   Potassium 4.1 3.5 - 5.2 mmol/L   Chloride 99 96 - 106 mmol/L   CO2 20 20 - 29 mmol/L   Calcium 9.4 8.7 - 10.3 mg/dL   Total Protein 6.7 6.0 - 8.5 g/dL   Albumin 4.4 3.7 - 4.7 g/dL   Globulin, Total 2.3 1.5 - 4.5 g/dL   Albumin/Globulin Ratio 1.9 1.2 - 2.2   Bilirubin Total 0.4 0.0 - 1.2 mg/dL   Alkaline Phosphatase 81 44 - 121 IU/L   AST 22 0 - 40 IU/L   ALT 17 0 - 32 IU/L  Lipid Panel w/o Chol/HDL Ratio  Result Value Ref Range   Cholesterol, Total 121 100 - 199 mg/dL   Triglycerides 841 (H) 0 - 149 mg/dL   HDL 29 (L) >32 mg/dL   VLDL Cholesterol Cal 37 5 - 40  mg/dL   LDL Chol Calc (NIH) 55 0 - 99 mg/dL  TSH  Result Value Ref Range   TSH 0.742 0.450 -  4.500 uIU/mL  Magnesium  Result Value Ref Range   Magnesium 1.6 1.6 - 2.3 mg/dL      Assessment & Plan:   Problem List Items Addressed This Visit       Cardiovascular and Mediastinum   Aortic atherosclerosis (HCC)    Tolerating her crestor well. Will recheck levels today. Refills given. Call with any concerns.       Relevant Orders   Lipid Panel w/o Chol/HDL Ratio   Comprehensive metabolic panel     Endocrine   Hypothyroidism - Primary    Rechecking labs today. Await results. Treat as needed.       Relevant Orders   TSH   Other Visit Diagnoses     Skin tag       Given location near eye suggested seeing her dermatologist. Call with any concerns.        Follow up plan: Return October follow up.

## 2023-01-10 NOTE — Assessment & Plan Note (Signed)
Rechecking labs today. Await results. Treat as needed.  °

## 2023-01-10 NOTE — Assessment & Plan Note (Signed)
Tolerating her crestor well. Will recheck levels today. Refills given. Call with any concerns.

## 2023-01-11 ENCOUNTER — Other Ambulatory Visit: Payer: Self-pay | Admitting: Family Medicine

## 2023-01-11 ENCOUNTER — Telehealth (INDEPENDENT_AMBULATORY_CARE_PROVIDER_SITE_OTHER): Payer: Self-pay | Admitting: Vascular Surgery

## 2023-01-11 DIAGNOSIS — E039 Hypothyroidism, unspecified: Secondary | ICD-10-CM

## 2023-01-11 LAB — LIPID PANEL W/O CHOL/HDL RATIO
Cholesterol, Total: 127 mg/dL (ref 100–199)
HDL: 41 mg/dL (ref >39)
LDL Chol Calc (NIH): 57 mg/dL (ref 0–99)
Triglycerides: 171 mg/dL — ABNORMAL HIGH (ref 0–149)
VLDL Cholesterol Cal: 29 mg/dL (ref 5–40)

## 2023-01-11 LAB — COMPREHENSIVE METABOLIC PANEL
ALT: 15 IU/L (ref 0–32)
AST: 21 IU/L (ref 0–40)
Albumin/Globulin Ratio: 2 (ref 1.2–2.2)
Albumin: 4.3 g/dL (ref 3.7–4.7)
Alkaline Phosphatase: 84 IU/L (ref 44–121)
BUN/Creatinine Ratio: 16 (ref 12–28)
BUN: 11 mg/dL (ref 8–27)
Bilirubin Total: 0.3 mg/dL (ref 0.0–1.2)
CO2: 24 mmol/L (ref 20–29)
Calcium: 9 mg/dL (ref 8.7–10.3)
Chloride: 103 mmol/L (ref 96–106)
Creatinine, Ser: 0.67 mg/dL (ref 0.57–1.00)
Globulin, Total: 2.2 g/dL (ref 1.5–4.5)
Glucose: 71 mg/dL (ref 70–99)
Potassium: 4.1 mmol/L (ref 3.5–5.2)
Sodium: 143 mmol/L (ref 134–144)
Total Protein: 6.5 g/dL (ref 6.0–8.5)
eGFR: 87 mL/min/{1.73_m2} (ref >59)

## 2023-01-11 LAB — TSH: TSH: 52.1 u[IU]/mL — ABNORMAL HIGH (ref 0.450–4.500)

## 2023-01-11 NOTE — Telephone Encounter (Signed)
Made in error

## 2023-01-12 ENCOUNTER — Other Ambulatory Visit (INDEPENDENT_AMBULATORY_CARE_PROVIDER_SITE_OTHER): Payer: Self-pay | Admitting: Nurse Practitioner

## 2023-01-12 DIAGNOSIS — Z9889 Other specified postprocedural states: Secondary | ICD-10-CM

## 2023-01-12 DIAGNOSIS — I6523 Occlusion and stenosis of bilateral carotid arteries: Secondary | ICD-10-CM

## 2023-01-17 ENCOUNTER — Ambulatory Visit (INDEPENDENT_AMBULATORY_CARE_PROVIDER_SITE_OTHER): Payer: Medicare Other | Admitting: Vascular Surgery

## 2023-01-17 ENCOUNTER — Encounter (INDEPENDENT_AMBULATORY_CARE_PROVIDER_SITE_OTHER): Payer: Self-pay | Admitting: Vascular Surgery

## 2023-01-17 ENCOUNTER — Encounter: Payer: Self-pay | Admitting: Student in an Organized Health Care Education/Training Program

## 2023-01-17 ENCOUNTER — Ambulatory Visit
Payer: Medicare Other | Attending: Student in an Organized Health Care Education/Training Program | Admitting: Student in an Organized Health Care Education/Training Program

## 2023-01-17 ENCOUNTER — Ambulatory Visit (INDEPENDENT_AMBULATORY_CARE_PROVIDER_SITE_OTHER): Payer: Medicare Other

## 2023-01-17 VITALS — BP 126/72 | HR 83 | Resp 18 | Ht 64.0 in | Wt 118.8 lb

## 2023-01-17 VITALS — BP 160/62 | HR 64 | Temp 97.4°F | Ht 64.0 in | Wt 116.0 lb

## 2023-01-17 DIAGNOSIS — G894 Chronic pain syndrome: Secondary | ICD-10-CM | POA: Insufficient documentation

## 2023-01-17 DIAGNOSIS — I739 Peripheral vascular disease, unspecified: Secondary | ICD-10-CM

## 2023-01-17 DIAGNOSIS — I70213 Atherosclerosis of native arteries of extremities with intermittent claudication, bilateral legs: Secondary | ICD-10-CM

## 2023-01-17 DIAGNOSIS — I10 Essential (primary) hypertension: Secondary | ICD-10-CM | POA: Diagnosis not present

## 2023-01-17 DIAGNOSIS — M48062 Spinal stenosis, lumbar region with neurogenic claudication: Secondary | ICD-10-CM | POA: Insufficient documentation

## 2023-01-17 DIAGNOSIS — Z9889 Other specified postprocedural states: Secondary | ICD-10-CM | POA: Diagnosis not present

## 2023-01-17 DIAGNOSIS — M5136 Other intervertebral disc degeneration, lumbar region: Secondary | ICD-10-CM | POA: Diagnosis present

## 2023-01-17 DIAGNOSIS — M4120 Other idiopathic scoliosis, site unspecified: Secondary | ICD-10-CM | POA: Diagnosis present

## 2023-01-17 DIAGNOSIS — I6523 Occlusion and stenosis of bilateral carotid arteries: Secondary | ICD-10-CM

## 2023-01-17 DIAGNOSIS — M47814 Spondylosis without myelopathy or radiculopathy, thoracic region: Secondary | ICD-10-CM | POA: Insufficient documentation

## 2023-01-17 DIAGNOSIS — F1190 Opioid use, unspecified, uncomplicated: Secondary | ICD-10-CM | POA: Insufficient documentation

## 2023-01-17 DIAGNOSIS — M47816 Spondylosis without myelopathy or radiculopathy, lumbar region: Secondary | ICD-10-CM | POA: Diagnosis present

## 2023-01-17 MED ORDER — TRAMADOL HCL ER 100 MG PO TB24
100.0000 mg | ORAL_TABLET | Freq: Every day | ORAL | 2 refills | Status: DC
Start: 2023-01-17 — End: 2023-01-25

## 2023-01-17 MED ORDER — TRAMADOL HCL 50 MG PO TABS
50.0000 mg | ORAL_TABLET | Freq: Two times a day (BID) | ORAL | 2 refills | Status: DC | PRN
Start: 2023-01-17 — End: 2023-01-25

## 2023-01-17 NOTE — Progress Notes (Signed)
PROVIDER NOTE: Information contained herein reflects review and annotations entered in association with encounter. Interpretation of such information and data should be left to medically-trained personnel. Information provided to patient can be located elsewhere in the medical record under "Patient Instructions". Document created using STT-dictation technology, any transcriptional errors that may result from process are unintentional.    Patient: Savannah Mcclure  Service Category: E/M  Provider: Edward Jolly, MD  DOB: 10-15-1940  DOS: 01/17/2023  Specialty: Interventional Pain Management  MRN: [id number]  Setting: Ambulatory outpatient  PCP: Dorcas Carrow, DO  Type: Established Patient    Referring Provider: Dorcas Carrow, DO  Location: Office  Delivery: Face-to-face     HPI  Savannah Mcclure, a 82 y.o. year old female, is here today because of her Chronic pain syndrome [G89.4]. Savannah Mcclure's primary complain today is Back Pain (lower) Last encounter: My last encounter with her was on 06/15/23 Pertinent problems: Savannah Mcclure has Essential hypertension; COPD (chronic obstructive pulmonary disease) (HCC); Idiopathic scoliosis and kyphoscoliosis; Spinal stenosis of lumbar region with neurogenic claudication; PAD (peripheral artery disease) (HCC); Chronic left shoulder pain; Chronic pain syndrome; Spondylosis without myelopathy or radiculopathy, lumbar region; and Thoracic spondylosis without myelopathy on their pertinent problem list. Pain Assessment: Severity of Chronic pain is reported as a 2 /10. Location: Back Lower/denies. Onset: More than a month ago. Quality: Constant, Dull, Aching. Timing: Constant. Modifying factor(s): putting feet up. Vitals:  height is 5\' 4"  (1.626 m) and weight is 116 lb (52.6 kg). Her temporal temperature is 97.4 F (36.3 C) (abnormal). Her blood pressure is 160/62 (abnormal) and her pulse is 64. Her oxygen saturation is 100%.   Reason for  encounter: medication management.    Patient states that she recently had had carotid endarterectomy.  No complications after her surgery.  She continues to endorse low back and bilateral hip pain.  She is utilizing a pain patch that incorporates nanotechnology and states that it is significantly helping her pain.  She states that the short acting tramadol is not very helpful and that she often has to take 100 mg at once when she is in severe pain.  We discussed transitioning to a long-acting tramadol and reserving short acting tramadol for breakthrough pain.  And benefits were reviewed and patient would like to proceed with that plan.   Pharmacotherapy Assessment     Analgesic: Tramadol 100 mg ER +  50 mg BID prn   Monitoring: [place] PMP: PDMP reviewed during this encounter.       Pharmacotherapy: No side-effects or adverse reactions reported. Compliance: No problems identified. Effectiveness: Clinically acceptable.  Florina Ou, RN  01/17/2023 10:50 AM  Sign when Signing Visit Nursing Pain Medication Assessment:  Safety precautions to be maintained throughout the outpatient stay will include: orient to surroundings, keep bed in low position, maintain call bell within reach at all times, provide assistance with transfer out of bed and ambulation.  Medication Inspection Compliance: Pill count conducted under aseptic conditions, in front of the patient. Neither the pills nor the bottle was removed from the patient's sight at any time. Once count was completed pills were immediately returned to the patient in their original bottle.  Medication: Tramadol (Ultram) Pill/Patch Count:  24 of 120 pills remain Pill/Patch Appearance: Markings consistent with prescribed medication Bottle Appearance: Standard pharmacy container. Clearly labeled. Filled Date: 10 / 31 / 2023 Last Medication intake:  TodaySafety precautions to be maintained throughout the outpatient stay will include: orient to  surroundings, keep bed in low position, maintain call bell within reach at all times, provide assistance with transfer out of bed and ambulation.      UDS:  Summary  Date Value Ref Range Status  06/14/2022 Note  Final    Comment:    ==================================================================== [employer] Select 13 (MW) ==================================================================== Test                             Result       Flag       Units  Drug Present and Declared for Prescription Verification   Tramadol                       >7042        EXPECTED   ng/mg creat   O-Desmethyltramadol            >7042        EXPECTED   ng/mg creat   N-Desmethyltramadol            >7042        EXPECTED   ng/mg creat    Source of tramadol is a prescription medication. O-desmethyltramadol    and N-desmethyltramadol are expected metabolites of tramadol.  ==================================================================== Test                      Result    Flag   Units      Ref Range   Creatinine              71               mg/dL      >=16 ==================================================================== Declared Medications:  The flagging and interpretation on this report are based on the  following declared medications.  Unexpected results may arise from  inaccuracies in the declared medications.   **Note: The testing scope of this panel includes these medications:   Tramadol (Ultram)   **Note: The testing scope of this panel does not include the  following reported medications:   Acetaminophen (Tylenol)  Albuterol (Ventolin HFA)  Atenolol (Tenormin)  Benazepril (Lotensin)  Cilostazol (Pletal)  Cyclobenzaprine (Flexeril)  Diphenhydramine (Benadryl)  Fluticasone (Flonase)  Fluticasone (Trelegy)  Gabapentin (Neurontin)  Hydroxyzine (Atarax)  Montelukast (Singulair)  Multivitamin  Niacinamide  Omeprazole (Prilosec)  Supplement  Thyroid: Liothyronine/Levothyroxine  (Armour)  Topical Lidocaine (Lidoderm)  Umeclidinium (Trelegy)  Vilanterol (Trelegy)  Vitamin B  Vitamin C ==================================================================== For clinical consultation, please call [phone]. ====================================================================      ROS  Constitutional: Denies any fever or chills Gastrointestinal: No reported hemesis, hematochezia, vomiting, or acute GI distress Musculoskeletal:  +LBP Neurological: No reported episodes of acute onset apraxia, aphasia, dysarthria, agnosia, amnesia, paralysis, loss of coordination, or loss of consciousness  Medication Review  B Complex-C, Collagen-Vitamin C-Biotin, Fluticasone-Umeclidin-Vilant, Misc Natural Products, Niacinamide, albuterol, aspirin EC, atenolol, benazepril, cyclobenzaprine, diphenhydrAMINE, fluticasone, gabapentin, hydrOXYzine, lidocaine, montelukast, multivitamin with minerals, omeprazole, ondansetron, rosuvastatin, thyroid, ticagrelor, and traMADol  History Review  Allergy: Savannah Mcclure is allergic to plavix [clopidogrel], other, oxycodone, amlodipine, atorvastatin, augmentin [amoxicillin-pot clavulanate], azithromycin, contrast media [iodinated contrast media], doxycycline, erythromycin, hydrocodone, lactose intolerance (gi), minocycline, nsaids, relafen [nabumetone], sulfa antibiotics, synthroid [levothyroxine], tetracyclines & related, tilactase, and tramadol. Drug: Savannah Mcclure  reports no history of drug use. Alcohol:  reports current alcohol use. Tobacco:  reports that she has been smoking cigarettes. She has a 20.00 pack-year smoking history. She has never used  smokeless tobacco. Social: Savannah Mcclure  reports that she has been smoking cigarettes. She has a 20.00 pack-year smoking history. She has never used smokeless tobacco. She reports current alcohol use. She reports that she does not use drugs. Medical:  has a past medical history of Abdominal  distension (03/18/2012), Abnormal weight loss (10/30/2017), Acute kidney injury (HCC) (03/22/2012), Aneurysm (HCC), Anxiety, Blind right eye, Cardiac arrest (HCC), Cataract, Claudication (HCC), Complication of anesthesia (03/15/2012), COPD (chronic obstructive pulmonary disease) (HCC), Delirium (03/18/2012), Dyspnea, GERD (gastroesophageal reflux disease), Heart murmur, Hypertension, Leaky heart valve, Oxygen deficit, Peripheral vascular disease (HCC), Scoliosis, Sepsis (HCC), Shock (HCC) (03/23/2012), SIRS (systemic inflammatory response syndrome) (HCC) (03/23/2012), Spinal stenosis, Stroke (HCC), and Tachycardia. Surgical: Savannah Mcclure  has a past surgical history that includes right eye surgery; Wisdom tooth extraction; Back surgery; Total abdominal hysterectomy; Breast lumpectomy (Left); Lower Extremity Angiography (Right, 06/20/2018); Cataract extraction w/PHACO (Left, 08/14/2019); Lower Extremity Angiography (Left, 02/03/2020); and CAROTID PTA/STENT INTERVENTION (Right, 09/19/2022). Family: family history includes Heart disease in her paternal grandfather and paternal grandmother; Heart failure in her father; Hypertension in her mother; Intracerebral hemorrhage in her mother; Kidney disease in her maternal grandmother; Rheum arthritis in her maternal grandmother; Schizophrenia in her daughter; Stroke in her paternal grandfather and paternal grandmother.  Laboratory Chemistry Profile   Renal Lab Results  Component Value Date   BUN 11 01/10/2023   CREATININE 0.67 01/10/2023   BCR 16 01/10/2023   GFRAA 90 09/10/2020   GFRNONAA >60 09/20/2022     Hepatic Lab Results  Component Value Date   AST 21 01/10/2023   ALT 15 01/10/2023   ALBUMIN 4.3 01/10/2023   ALKPHOS 84 01/10/2023     Electrolytes Lab Results  Component Value Date   NA 143 01/10/2023   K 4.1 01/10/2023   CL 103 01/10/2023   CALCIUM 9.0 01/10/2023   MG 1.6 11/29/2022   PHOS 3.2 10/13/2017     Bone Lab Results  Component Value  Date   VD25OH 12.5 (L) 10/19/2021     Inflammation (CRP: Acute Phase) (ESR: Chronic Phase) Lab Results  Component Value Date   ESRSEDRATE 25 07/21/2022   LATICACIDVEN 1.6 07/10/2021       Note: Above Lab results reviewed.  Recent Imaging Review  DG Knee Complete 4 Views Left CLINICAL DATA:  Left knee pain, injury 1 week ago  EXAM: LEFT KNEE - COMPLETE 4+ VIEW  COMPARISON:  None Available.  FINDINGS: Frontal, bilateral oblique, lateral views of the left knee are obtained. No acute fracture, subluxation, or dislocation. Mild 3 compartmental joint space narrowing consistent with osteoarthritis. No joint effusion. The soft tissues are unremarkable.  IMPRESSION: 1. Mild 3 compartmental osteoarthritis. 2. No acute bony abnormality.  Electronically Signed   By: Sharlet Salina M.D.   On: 10/27/2022 17:00 Note: Reviewed        Physical Exam  General appearance: Well nourished, well developed, and well hydrated. In no apparent acute distress Mental status: Alert, oriented x 3 (person, place, & time)       Respiratory: No evidence of acute respiratory distress Eyes: PERLA Vitals: BP (!) 160/62 (BP Location: Right Arm, Patient Position: Sitting, Cuff Size: Normal)   Pulse 64   Temp (!) 97.4 F (36.3 C) (Temporal)   Ht 5\' 4"  (1.626 m)   Wt 116 lb (52.6 kg)   SpO2 100%   BMI 19.91 kg/m  BMI: Estimated body mass index is 19.91 kg/m as calculated from the following:   Height as of  this encounter: 5\' 4"  (1.626 m).   Weight as of this encounter: 116 lb (52.6 kg). Ideal: Ideal body weight: 54.7 kg (120 lb 9.5 oz)  Low back pain, worse with facet loading Significant scoliosis 5 out of 5 strength bilateral lower extremity: Plantar flexion, dorsiflexion, knee flexion, knee extension.   Assessment   Status Diagnosis  Controlled Controlled Controlled 1. Chronic pain syndrome   2. Idiopathic scoliosis and kyphoscoliosis   3. Thoracic spondylosis without myelopathy   4.  Spondylosis without myelopathy or radiculopathy, lumbar region   5. Lumbar degenerative disc disease   6. Spinal stenosis of lumbar region with neurogenic claudication   7. Chronic, continuous use of opioids        Plan of Care   Savannah Mcclure has a current medication list which includes the following long-term medication(s): albuterol, atenolol, benazepril, gabapentin, montelukast, omeprazole, rosuvastatin, and thyroid.  Pharmacotherapy (Medications Ordered): Meds ordered this encounter  Medications   traMADol (ULTRAM-ER) 100 MG 24 hr tablet    Sig: Take 1 tablet (100 mg total) by mouth daily.    Dispense:  30 tablet    Refill:  2   traMADol (ULTRAM) 50 MG tablet    Sig: Take 1 tablet (50 mg total) by mouth every 12 (twelve) hours as needed.    Dispense:  60 tablet    Refill:  2   Orders:  No orders of the defined types were placed in this encounter.  Follow-up plan:   Return in about 3 months (around 04/19/2023) for Medication Management, in person.     Status post T11, T12, L1 RFA bilaterally on 03/04/2019, 04/2018       Recent Visits No visits were found meeting these conditions. Showing recent visits within past 90 days and meeting all other requirements Today's Visits Date Type Provider Dept  01/17/23 Office Visit Edward Jolly, MD [facility]  Showing today's visits and meeting all other requirements Future Appointments No visits were found meeting these conditions. Showing future appointments within next 90 days and meeting all other requirements  I discussed the assessment and treatment plan with the patient. The patient was provided an opportunity to ask questions and all were answered. The patient agreed with the plan and demonstrated an understanding of the instructions.  Patient advised to call back or seek an in-person evaluation if the symptoms or condition worsens.  Duration of encounter: 30 minutes.  Note by: Edward Jolly, MD Date:  01/17/2023; Time: 11:48 AM

## 2023-01-17 NOTE — Progress Notes (Signed)
Nursing Pain Medication Assessment:  Safety precautions to be maintained throughout the outpatient stay will include: orient to surroundings, keep bed in low position, maintain call bell within reach at all times, provide assistance with transfer out of bed and ambulation.  Medication Inspection Compliance: Pill count conducted under aseptic conditions, in front of the patient. Neither the pills nor the bottle was removed from the patient's sight at any time. Once count was completed pills were immediately returned to the patient in their original bottle.  Medication: Tramadol (Ultram) Pill/Patch Count:  24 of 120 pills remain Pill/Patch Appearance: Markings consistent with prescribed medication Bottle Appearance: Standard pharmacy container. Clearly labeled. Filled Date: 10 / 31 / 2023 Last Medication intake:  TodaySafety precautions to be maintained throughout the outpatient stay will include: orient to surroundings, keep bed in low position, maintain call bell within reach at all times, provide assistance with transfer out of bed and ambulation.

## 2023-01-17 NOTE — Progress Notes (Unsigned)
MRN : [id number]  Yisel [name] is a 82 y.o. (February 03, 1941) female who presents with chief complaint of No chief complaint on file. Marland Kitchen  History of Present Illness: Patient returns today in follow up of ***  Current Outpatient Medications  Medication Sig Dispense Refill   albuterol (VENTOLIN HFA) 108 (90 Base) MCG/ACT inhaler Inhale 2 puffs into the lungs every 6 (six) hours as needed for wheezing or shortness of breath. 8 g 6   aspirin EC 81 MG tablet Take 1 tablet (81 mg total) by mouth daily at 6 (six) AM. Swallow whole. 30 tablet 12   atenolol (TENORMIN) 25 MG tablet Take 1 tablet (25 mg total) by mouth daily. 90 tablet 1   B-COMPLEX-C PO Take 1 tablet by mouth daily.     benazepril (LOTENSIN) 10 MG tablet Take 1 tablet (10 mg total) by mouth daily. 90 tablet 1   Collagen-Vitamin C-Biotin (COLLAGEN 1500/C) 500-50-0.8 MG CAPS Take by mouth.     cyclobenzaprine (FLEXERIL) 5 MG tablet Take 1 tablet (5 mg total) by mouth 3 (three) times daily as needed for muscle spasms. 30 tablet 6   diphenhydrAMINE (BENADRYL) 25 MG tablet Take 25 mg by mouth every 6 (six) hours as needed.      fluticasone (FLONASE) 50 MCG/ACT nasal spray 2 sprays into each nostril in the morning. Up to three sprays.     Fluticasone-Umeclidin-Vilant (TRELEGY ELLIPTA) 100-62.5-25 MCG/ACT AEPB Inhale 1 puff into the lungs daily for 1 day. 60 each 6   gabapentin (NEURONTIN) 300 MG capsule Take 1 capsule (300 mg total) by mouth 2 (two) times daily. 180 capsule 6   hydrOXYzine (ATARAX) 25 MG tablet Take 1 tablet (25 mg total) by mouth 3 (three) times daily as needed. 270 tablet 3   lidocaine (LIDODERM) 5 % Place 1 patch onto the skin daily. Remove & Discard patch within 12 hours or as directed by MD 60 patch 6   Misc Natural Products (JOINT HEALTH PO) Take by mouth daily.     montelukast (SINGULAIR) 10 MG tablet Take 1 tablet (10 mg total) by mouth at bedtime. 90 tablet 3   Multiple Vitamins-Minerals (MULTIVITAMIN WITH  MINERALS) tablet Take 1 tablet by mouth daily.     NIACINAMIDE PO Take 1 capsule by mouth daily.     omeprazole (PRILOSEC) 20 MG capsule Take 1 capsule (20 mg total) by mouth daily. 90 capsule 1   ondansetron (ZOFRAN-ODT) 4 MG disintegrating tablet Take 1 tablet (4 mg total) by mouth every 8 (eight) hours as needed for nausea or vomiting. 20 tablet 0   rosuvastatin (CRESTOR) 5 MG tablet Take 1 tablet (5 mg total) by mouth daily. 90 tablet 1   thyroid (ARMOUR THYROID) 90 MG tablet Take 1 tablet (90 mg total) by mouth every other day. 90 tablet 3   ticagrelor (BRILINTA) 90 MG TABS tablet Take 1 tablet (90 mg total) by mouth 2 (two) times daily. 90 tablet 3   traMADol (ULTRAM) 50 MG tablet Take 1 tablet (50 mg total) by mouth every 12 (twelve) hours as needed. 60 tablet 2   traMADol (ULTRAM-ER) 100 MG 24 hr tablet Take 1 tablet (100 mg total) by mouth daily. 30 tablet 2   Current Facility-Administered Medications  Medication Dose Route Frequency Provider Last Rate Last Admin   albuterol (PROVENTIL) (2.5 MG/3ML) 0.083% nebulizer solution 2.5 mg  2.5 mg Nebulization Once Dorcas Carrow, DO        Past Medical History:  Diagnosis Date   Abdominal distension 03/18/2012   Abnormal weight loss 10/30/2017   Acute kidney injury (HCC) 03/22/2012   Aneurysm (HCC)    right eye   Anxiety    Blind right eye    Cardiac arrest [facility])    Cataract    right eye   Claudication (HCC)    Complication of anesthesia 03/15/2012   "crashed" during first part of 3-part back surgery.    COPD (chronic obstructive pulmonary disease) (HCC)    Delirium 03/18/2012   Dyspnea    GERD (gastroesophageal reflux disease)    Heart murmur    Hypertension    Leaky heart valve    Oxygen deficit    uses O2 at night 2 liters   Peripheral vascular disease (HCC)    Scoliosis    Sepsis (HCC)    Shock (HCC) 03/23/2012   SIRS (systemic inflammatory response syndrome) (HCC) 03/23/2012   Spinal stenosis    Stroke [facility])    Eye    Tachycardia     Past Surgical History:  Procedure Laterality Date   BACK SURGERY     BREAST LUMPECTOMY Left    CAROTID PTA/STENT INTERVENTION Right 09/19/2022   Procedure: CAROTID PTA/STENT INTERVENTION;  Surgeon: Annice Needy, MD;  Location: [facility] INVASIVE CV LAB;  Service: Cardiovascular;  Laterality: Right;   CATARACT EXTRACTION W/PHACO Left 08/14/2019   Procedure: CATARACT EXTRACTION PHACO AND INTRAOCULAR LENS PLACEMENT (IOC) LEFT 10.22 00:59.7 42.4%;  Surgeon: Lockie Mola, MD;  Location: [facility];  Service: Ophthalmology;  Laterality: Left;  leave it last patient per [place]   LOWER EXTREMITY ANGIOGRAPHY Right 06/20/2018   Procedure: LOWER EXTREMITY ANGIOGRAPHY;  Surgeon: Annice Needy, MD;  Location: [facility] INVASIVE CV LAB;  Service: Cardiovascular;  Laterality: Right;   LOWER EXTREMITY ANGIOGRAPHY Left 02/03/2020   Procedure: LOWER EXTREMITY ANGIOGRAPHY;  Surgeon: Annice Needy, MD;  Location: [facility] INVASIVE CV LAB;  Service: Cardiovascular;  Laterality: Left;   right eye surgery     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   TOTAL ABDOMINAL HYSTERECTOMY     total   WISDOM TOOTH EXTRACTION       Social History   Tobacco Use   Smoking status: Every Day    Packs/day: 0.50    Years: 40.00    Additional pack years: 0.00    Total pack years: 20.00    Types: Cigarettes   Smokeless tobacco: Never   Tobacco comments:    0.5PPD 08/31/2021  Vaping Use   Vaping Use: Former  Substance Use Topics   Alcohol use: Yes    Comment: wine on occasion (1x/month)   Drug use: No   ***    Family History  Problem Relation Age of Onset   Intracerebral hemorrhage Mother    Hypertension Mother    Heart failure Father    Schizophrenia Daughter    Rheum arthritis Maternal Grandmother    Kidney disease Maternal Grandmother    Heart disease Paternal Grandmother    Stroke Paternal Grandmother    Stroke Paternal Grandfather    Heart disease Paternal Grandfather    ***  Allergies   Allergen Reactions   Plavix [Clopidogrel] Other (See Comments)    States "I hurt so bad I couldn't get out of bed."   Other Itching    Cat Gut sutures    Oxycodone Hives   Amlodipine Hives   Atorvastatin Other (See Comments)    weakness   Augmentin [Amoxicillin-Pot Clavulanate] Diarrhea   Azithromycin  Hives   Contrast Media [Iodinated Contrast Media] Hives   Doxycycline Diarrhea   Erythromycin Diarrhea   Hydrocodone Nausea Only   Lactose Intolerance (Gi) Diarrhea    Bloating   Minocycline Diarrhea   Nsaids Other (See Comments)    Internal Bleeding   Relafen [Nabumetone] Hives   Sulfa Antibiotics Hives   Synthroid [Levothyroxine] Diarrhea   Tetracyclines & Related Diarrhea   Tilactase    Tramadol Rash    Takes +/- Benadryl     REVIEW OF SYSTEMS (Negative unless checked)   Constitutional: [] Weight loss  [] Fever  [] Chills Cardiac: [] Chest pain   [] Chest pressure   [x] Palpitations   [] Shortness of breath when laying flat   [] Shortness of breath at rest   [] Shortness of breath with exertion. Vascular:  [x] Pain in legs with walking   [x] Pain in legs at rest   [] Pain in legs when laying flat   [] Claudication   [] Pain in feet when walking  [] Pain in feet at rest  [] Pain in feet when laying flat   [] History of DVT   [] Phlebitis   [] Swelling in legs   [] Varicose veins   [] Non-healing ulcers Pulmonary:   [] Uses home oxygen   [] Productive cough   [] Hemoptysis   [] Wheeze  [] COPD   [] Asthma Neurologic:  [] Dizziness  [] Blackouts   [] Seizures   [x] History of stroke   [] History of TIA  [] Aphasia   [] Temporary blindness   [] Dysphagia   [] Weakness or numbness in arms   [] Weakness or numbness in legs Musculoskeletal:  [x] Arthritis   [] Joint swelling   [] Joint pain   [] Low back pain Hematologic:  [] Easy bruising  [] Easy bleeding   [] Hypercoagulable state   [] Anemic   Gastrointestinal:  [x] Blood in stool   [] Vomiting blood  [x] Gastroesophageal reflux/heartburn   [] Abdominal pain Genitourinary:   [] Chronic kidney disease   [] Difficult urination  [] Frequent urination  [] Burning with urination   [] Hematuria Skin:  [] Rashes   [] Ulcers   [] Wounds Psychological:  [] History of anxiety   []  History of major depression.  Physical Examination  There were no vitals taken for this visit. Gen:  WD/WN, NAD Head: /AT, No temporalis wasting. Ear/Nose/Throat: Hearing grossly intact, nares w/o erythema or drainage Eyes: Conjunctiva clear. Sclera non-icteric Neck: Supple.  Trachea midline Pulmonary:  Good air movement, no use of accessory muscles.  Cardiac: RRR, no JVD Vascular:  Vessel Right Left  Radial Palpable Palpable                          PT Palpable Palpable  DP Palpable Palpable   Gastrointestinal: soft, non-tender/non-distended. No guarding/reflex.  Musculoskeletal: M/S 5/5 throughout.  No deformity or atrophy. *** edema. Neurologic: Sensation grossly intact in extremities.  Symmetrical.  Speech is fluent.  Psychiatric: Judgment intact, Mood & affect appropriate for pt's clinical situation. Dermatologic: No rashes or ulcers noted.  No cellulitis or open wounds.      Labs Recent Results (from the past 2160 hour(s))  CBC with Differential/Platelet     Status: Abnormal   Collection Time: 11/29/22 11:36 AM  Result Value Ref Range   WBC 14.5 (H) 3.4 - 10.8 x10E3/uL   RBC 4.49 3.77 - 5.28 x10E6/uL   Hemoglobin 13.3 11.1 - 15.9 g/dL   Hematocrit 16.1 09.6 - 46.6 %   MCV 92 79 - 97 fL   MCH 29.6 26.6 - 33.0 pg   MCHC 32.4 31.5 - 35.7 g/dL   RDW 04.5 40.9 - 81.1 %  Platelets 403 150 - 450 x10E3/uL   Neutrophils 74 Not Estab. %   Lymphs 18 Not Estab. %   Monocytes 6 Not Estab. %   Eos 2 Not Estab. %   Basos 0 Not Estab. %   Neutrophils Absolute 10.6 (H) 1.4 - 7.0 x10E3/uL   Lymphocytes Absolute 2.6 0.7 - 3.1 x10E3/uL   Monocytes Absolute 0.9 0.1 - 0.9 x10E3/uL   EOS (ABSOLUTE) 0.3 0.0 - 0.4 x10E3/uL   Basophils Absolute 0.1 0.0 - 0.2 x10E3/uL   Immature  Granulocytes 0 Not Estab. %   Immature Grans (Abs) 0.0 0.0 - 0.1 x10E3/uL  Comprehensive metabolic panel     Status: Abnormal   Collection Time: 11/29/22 11:36 AM  Result Value Ref Range   Glucose 155 (H) 70 - 99 mg/dL   BUN 21 8 - 27 mg/dL   Creatinine, Ser 1.61 (H) 0.57 - 1.00 mg/dL   eGFR 49 (L) >09 UE/AVW/0.98   BUN/Creatinine Ratio 19 12 - 28   Sodium 140 134 - 144 mmol/L   Potassium 4.1 3.5 - 5.2 mmol/L   Chloride 99 96 - 106 mmol/L   CO2 20 20 - 29 mmol/L   Calcium 9.4 8.7 - 10.3 mg/dL   Total Protein 6.7 6.0 - 8.5 g/dL   Albumin 4.4 3.7 - 4.7 g/dL   Globulin, Total 2.3 1.5 - 4.5 g/dL   Albumin/Globulin Ratio 1.9 1.2 - 2.2   Bilirubin Total 0.4 0.0 - 1.2 mg/dL   Alkaline Phosphatase 81 44 - 121 IU/L   AST 22 0 - 40 IU/L   ALT 17 0 - 32 IU/L  Lipid Panel w/o Chol/HDL Ratio     Status: Abnormal   Collection Time: 11/29/22 11:36 AM  Result Value Ref Range   Cholesterol, Total 121 100 - 199 mg/dL   Triglycerides 119 (H) 0 - 149 mg/dL   HDL 29 (L) >14 mg/dL   VLDL Cholesterol Cal 37 5 - 40 mg/dL   LDL Chol Calc (NIH) 55 0 - 99 mg/dL  TSH     Status: None   Collection Time: 11/29/22 11:36 AM  Result Value Ref Range   TSH 0.742 0.450 - 4.500 uIU/mL  Magnesium     Status: None   Collection Time: 11/29/22 11:36 AM  Result Value Ref Range   Magnesium 1.6 1.6 - 2.3 mg/dL  TSH     Status: Abnormal   Collection Time: 01/10/23  1:26 PM  Result Value Ref Range   TSH 52.100 (H) 0.450 - 4.500 uIU/mL  Lipid Panel w/o Chol/HDL Ratio     Status: Abnormal   Collection Time: 01/10/23  1:26 PM  Result Value Ref Range   Cholesterol, Total 127 100 - 199 mg/dL   Triglycerides 782 (H) 0 - 149 mg/dL   HDL 41 >95 mg/dL   VLDL Cholesterol Cal 29 5 - 40 mg/dL   LDL Chol Calc (NIH) 57 0 - 99 mg/dL  Comprehensive metabolic panel     Status: None   Collection Time: 01/10/23  1:26 PM  Result Value Ref Range   Glucose 71 70 - 99 mg/dL   BUN 11 8 - 27 mg/dL   Creatinine, Ser 6.21 0.57 -  1.00 mg/dL   eGFR 87 >30 QM/VHQ/4.69   BUN/Creatinine Ratio 16 12 - 28   Sodium 143 134 - 144 mmol/L   Potassium 4.1 3.5 - 5.2 mmol/L   Chloride 103 96 - 106 mmol/L   CO2 24 20 - 29 mmol/L  Calcium 9.0 8.7 - 10.3 mg/dL   Total Protein 6.5 6.0 - 8.5 g/dL   Albumin 4.3 3.7 - 4.7 g/dL   Globulin, Total 2.2 1.5 - 4.5 g/dL   Albumin/Globulin Ratio 2.0 1.2 - 2.2   Bilirubin Total 0.3 0.0 - 1.2 mg/dL   Alkaline Phosphatase 84 44 - 121 IU/L   AST 21 0 - 40 IU/L   ALT 15 0 - 32 IU/L    Radiology No results found.  Assessment/Plan HTN (hypertension) blood pressure control important in reducing the progression of atherosclerotic disease. On appropriate oral medications.   Atherosclerosis of native arteries of extremity with intermittent claudication (HCC) ***   Vision loss Possibly related to carotid disease.  No problem-specific Assessment & Plan notes found for this encounter.    Festus Barren, MD  01/17/2023 1:24 PM    This note was created with Dragon medical transcription system.  Any errors from dictation are purely unintentional

## 2023-01-18 LAB — VAS US ABI WITH/WO TBI
Left ABI: 0.55
Right ABI: 0.44

## 2023-01-19 NOTE — Assessment & Plan Note (Signed)
She has velocities that would fall in the 40 to 59% range on each side although we do see some increase in velocities after carotid stenting regularly and there does not appear to be any significant recurrent stenosis.  No change in medications.  No role for intervention.  Follow-up in 6 months.

## 2023-01-19 NOTE — Assessment & Plan Note (Addendum)
ABIs today have dropped to 0.44 on the right and 0.55 on the left.  She is now quite more symptomatic as well.  We will plan angiography with possible revascularization.  For aortoiliac disease we may be able to treat both sides concomitantly, but otherwise we will do her right leg first as this is the worst of the 2.  This is not lifestyle limiting.  Risks and benefits were discussed and she is agreeable to proceed.

## 2023-01-23 ENCOUNTER — Telehealth: Payer: Self-pay | Admitting: Student in an Organized Health Care Education/Training Program

## 2023-01-23 DIAGNOSIS — L918 Other hypertrophic disorders of the skin: Secondary | ICD-10-CM | POA: Diagnosis not present

## 2023-01-23 DIAGNOSIS — M47814 Spondylosis without myelopathy or radiculopathy, thoracic region: Secondary | ICD-10-CM

## 2023-01-23 DIAGNOSIS — G894 Chronic pain syndrome: Secondary | ICD-10-CM

## 2023-01-23 DIAGNOSIS — M4120 Other idiopathic scoliosis, site unspecified: Secondary | ICD-10-CM

## 2023-01-23 NOTE — Telephone Encounter (Signed)
PT stated that she has called varies pharmacy to get Tramadol to get prescription fill. PT stated that none of the pharmacy has Tramadol in stocked. Please give patient a call. TY

## 2023-01-24 NOTE — Telephone Encounter (Signed)
She did get the full prescription for the IR. She did not receive any Tramadol ER.

## 2023-01-24 NOTE — Telephone Encounter (Signed)
Attempted to call patient to inform her of this. Message left.

## 2023-01-24 NOTE — Telephone Encounter (Signed)
They do not know when it will be in stock. Do I need to tell her that Tramadol ER isn't available, and just take the IR as you instructed in your previous note?

## 2023-01-25 MED ORDER — TRAMADOL HCL 50 MG PO TABS
50.0000 mg | ORAL_TABLET | Freq: Four times a day (QID) | ORAL | 2 refills | Status: AC | PRN
Start: 2023-02-06 — End: 2023-05-07

## 2023-01-25 NOTE — Addendum Note (Signed)
Addended by: Edward Jolly on: 01/25/2023 04:05 PM   Modules accepted: Orders

## 2023-01-25 NOTE — Telephone Encounter (Signed)
Previous prescription for Tramadol IR was written for #60 tablets.  She does not have enough to take as you instructed, regarding no Tramadol ER available. Will you send a new Rx?

## 2023-01-26 NOTE — Telephone Encounter (Signed)
Patient notified

## 2023-02-01 ENCOUNTER — Telehealth (INDEPENDENT_AMBULATORY_CARE_PROVIDER_SITE_OTHER): Payer: Self-pay

## 2023-02-01 NOTE — Telephone Encounter (Signed)
Spoke with the patient and she is scheduled with Dr. Wyn Quaker on 02/20/23 (RLE angio) and 02/27/23 (LLE angio) both with 6:45 am arrival time to the [facility]. Pre-procedure instructions were discussed and will be sent to Mychart and mailed.

## 2023-02-13 ENCOUNTER — Telehealth: Payer: Self-pay | Admitting: Family Medicine

## 2023-02-13 NOTE — Telephone Encounter (Signed)
Spoke with pharmacy staff [place], she stated she just needed a verbal to go ahead and fill the script again.  Called and informed pt that the script for the gabapentin was at the pharmacy for her and also informed her per provider please lock up her medication and Dr. Laural Benes would not refill early again. Pt voiced understanding.

## 2023-02-13 NOTE — Telephone Encounter (Signed)
I will do this 1x. But she will need to lock up her medicine so her daughter cannot take it. We will not refill her gabapentin early again. OK to let [facility] know it's ok to fill early this 1 time. If they need a bridge Rx for the 28 days, just let me know and I'll send it in.

## 2023-02-13 NOTE — Telephone Encounter (Unsigned)
Copied from CRM [id number]. Topic: General - Other >> Feb 13, 2023  9:04 AM Everette C wrote: Reason for CRM: Medication Refill - Medication: gabapentin (NEURONTIN) 300 MG capsule [[id number]]  Has the patient contacted their pharmacy? Yes.  The patient's daughter has taken roughly 30 of their tablets and the patient is in need of 28 to make it until their next refill is due.  (Agent: If no, request that the patient contact the pharmacy for the refill. If patient does not wish to contact the pharmacy document the reason why and proceed with request.) (Agent: If yes, when and what did the pharmacy advise?)  Preferred Pharmacy (with phone number or street name): [facility] [place] [place] - [address] [name] [address] Phone: [phone] Fax: [phone] Hours: Not open 24 hours   Has the patient been seen for an appointment in the last year OR does the patient have an upcoming appointment? Yes.    Agent: Please be advised that RX refills may take up to 3 business days. We ask that you follow-up with your pharmacy.

## 2023-02-13 NOTE — Telephone Encounter (Signed)
[employer] called and spoke to [place], [place] about the refill(s) gabapentin requested. Advised it was filled on 11/29/22 #90/0 refill(s). She said she advised the patient to call the provider for an early refill approval at this time.  She stated the earliest the patient can have it refilled will be February 26, 2023 without needed the provider's approval.

## 2023-02-20 ENCOUNTER — Encounter: Payer: Self-pay | Admitting: Vascular Surgery

## 2023-02-20 ENCOUNTER — Ambulatory Visit
Admission: RE | Admit: 2023-02-20 | Discharge: 2023-02-20 | Disposition: A | Discharge status: Home / Self Care | Payer: Medicare Other | Attending: Vascular Surgery | Admitting: Vascular Surgery

## 2023-02-20 ENCOUNTER — Other Ambulatory Visit: Payer: Self-pay

## 2023-02-20 ENCOUNTER — Encounter
Admission: RE | Disposition: A | Discharge status: Home / Self Care | Payer: Self-pay | Source: Home / Self Care | Attending: Vascular Surgery

## 2023-02-20 DIAGNOSIS — I70212 Atherosclerosis of native arteries of extremities with intermittent claudication, left leg: Secondary | ICD-10-CM | POA: Diagnosis not present

## 2023-02-20 DIAGNOSIS — I70221 Atherosclerosis of native arteries of extremities with rest pain, right leg: Secondary | ICD-10-CM | POA: Insufficient documentation

## 2023-02-20 DIAGNOSIS — I6529 Occlusion and stenosis of unspecified carotid artery: Secondary | ICD-10-CM | POA: Diagnosis not present

## 2023-02-20 DIAGNOSIS — Z95828 Presence of other vascular implants and grafts: Secondary | ICD-10-CM

## 2023-02-20 DIAGNOSIS — F17210 Nicotine dependence, cigarettes, uncomplicated: Secondary | ICD-10-CM | POA: Insufficient documentation

## 2023-02-20 DIAGNOSIS — I70219 Atherosclerosis of native arteries of extremities with intermittent claudication, unspecified extremity: Secondary | ICD-10-CM

## 2023-02-20 HISTORY — PX: LOWER EXTREMITY ANGIOGRAPHY: CATH118251

## 2023-02-20 LAB — CREATININE, SERUM
Creatinine, Ser: 0.87 mg/dL (ref 0.44–1.00)
GFR, Estimated: >60 mL/min (ref >60)

## 2023-02-20 LAB — BUN: BUN: 12 mg/dL (ref 8–23)

## 2023-02-20 SURGERY — LOWER EXTREMITY ANGIOGRAPHY
Anesthesia: Moderate Sedation | Site: Leg Lower | Laterality: Right

## 2023-02-20 MED ORDER — MIDAZOLAM HCL 2 MG/2ML IJ SOLN
INTRAMUSCULAR | Status: DC | PRN
  Administered 2023-02-20 (×2): 1 mg via INTRAVENOUS

## 2023-02-20 MED ORDER — METHYLPREDNISOLONE SODIUM SUCC 125 MG IJ SOLR: 125.0000 mg | Freq: Once | INTRAMUSCULAR | Status: AC | PRN

## 2023-02-20 MED ORDER — FENTANYL CITRATE (PF) 100 MCG/2ML IJ SOLN
INTRAMUSCULAR | Status: DC | PRN
  Administered 2023-02-20 (×2): 25 ug via INTRAVENOUS

## 2023-02-20 MED ORDER — MIDAZOLAM HCL 2 MG/ML PO SYRP: 8.0000 mg | ORAL_SOLUTION | Freq: Once | ORAL | Status: DC | PRN

## 2023-02-20 MED ORDER — HEPARIN SODIUM (PORCINE) 1000 UNIT/ML IJ SOLN
INTRAMUSCULAR | Status: DC | PRN
  Administered 2023-02-20: 1000 [IU] via INTRAVENOUS

## 2023-02-20 MED ORDER — CEFAZOLIN SODIUM-DEXTROSE 2-4 GM/100ML-% IV SOLN
INTRAVENOUS | Status: AC
  Filled 2023-02-20: qty 100

## 2023-02-20 MED ORDER — METHYLPREDNISOLONE SODIUM SUCC 125 MG IJ SOLR
INTRAMUSCULAR | Status: AC
  Administered 2023-02-20: 125 mg via INTRAVENOUS
  Filled 2023-02-20: qty 2

## 2023-02-20 MED ORDER — FENTANYL CITRATE PF 50 MCG/ML IJ SOSY
PREFILLED_SYRINGE | INTRAMUSCULAR | Status: AC
  Filled 2023-02-20: qty 1

## 2023-02-20 MED ORDER — CEFAZOLIN SODIUM-DEXTROSE 2-4 GM/100ML-% IV SOLN
2.0000 g | INTRAVENOUS | Status: AC
  Administered 2023-02-20: 2 g via INTRAVENOUS

## 2023-02-20 MED ORDER — MIDAZOLAM HCL 2 MG/2ML IJ SOLN
INTRAMUSCULAR | Status: AC
  Filled 2023-02-20: qty 2

## 2023-02-20 MED ORDER — FAMOTIDINE 20 MG PO TABS
ORAL_TABLET | ORAL | Status: AC
  Filled 2023-02-20: qty 2

## 2023-02-20 MED ORDER — DIPHENHYDRAMINE HCL 50 MG/ML IJ SOLN: 50.0000 mg | Freq: Once | INTRAMUSCULAR | Status: DC | PRN

## 2023-02-20 MED ORDER — LIDOCAINE-EPINEPHRINE (PF) 1 %-1:200000 IJ SOLN
INTRAMUSCULAR | Status: DC | PRN
  Administered 2023-02-20: 10 mL

## 2023-02-20 MED ORDER — HYDROMORPHONE HCL 1 MG/ML IJ SOLN: 1.0000 mg | Freq: Once | INTRAMUSCULAR | Status: DC | PRN

## 2023-02-20 MED ORDER — FAMOTIDINE 20 MG PO TABS
40.0000 mg | ORAL_TABLET | Freq: Once | ORAL | Status: AC | PRN
  Administered 2023-02-20: 40 mg via ORAL

## 2023-02-20 MED ORDER — HEPARIN SODIUM (PORCINE) 1000 UNIT/ML IJ SOLN
INTRAMUSCULAR | Status: AC
  Filled 2023-02-20: qty 10

## 2023-02-20 MED ORDER — ONDANSETRON HCL 4 MG/2ML IJ SOLN: 4.0000 mg | Freq: Four times a day (QID) | INTRAMUSCULAR | Status: DC | PRN

## 2023-02-20 MED ORDER — SODIUM CHLORIDE 0.9 % IV SOLN: INTRAVENOUS | Status: DC

## 2023-02-20 MED ORDER — IODIXANOL 320 MG/ML IV SOLN
INTRAVENOUS | Status: DC | PRN
  Administered 2023-02-20: 45 mL via INTRA_ARTERIAL

## 2023-02-20 SURGICAL SUPPLY — 8 items
CATH ANGIO 5F PIGTAIL 65CM (CATHETERS) IMPLANT
DEVICE STARCLOSE SE CLOSURE (Vascular Products) IMPLANT
GLIDEWIRE ADV .035X260CM (WIRE) IMPLANT
PACK ANGIOGRAPHY (CUSTOM PROCEDURE TRAY) ×1 IMPLANT
SHEATH BRITE TIP 5FRX11 (SHEATH) IMPLANT
SYR MEDRAD MARK 7 150ML (SYRINGE) IMPLANT
TUBING CONTRAST HIGH PRESS 72 (TUBING) IMPLANT
WIRE GUIDERIGHT .035X150 (WIRE) IMPLANT

## 2023-02-20 NOTE — H&P (Signed)
[facility] VASCULAR & VEIN SPECIALISTS Admission History & Physical  MRN : [id number]  Savannah Mcclure is a 82 y.o. (1940/08/22) female who presents with chief complaint of No chief complaint on file. Marland Kitchen  History of Present Illness: Patient presents for angiography.  Seen in the office previously and has disabling claudication symptoms and now symptoms worrisome for rest pain on the right leg.  ABI was 0.4 on the right and 0.5 on the left.  Long history of vascular disease with previous PAD interventions as well as carotid stent placement in the past.  No open wounds or infection.  No fevers or chills.  Current Facility-Administered Medications  Medication Dose Route Frequency Provider Last Rate Last Admin   0.9 %  sodium chloride infusion   Intravenous Continuous Georgiana Spinner, NP 75 mL/hr at 02/20/23 0737 New Bag at 02/20/23 0737   ceFAZolin (ANCEF) IVPB 2g/100 mL premix  2 g Intravenous 30 min Pre-Op Georgiana Spinner, NP 200 mL/hr at 02/20/23 0821 2 g at 02/20/23 4098   diphenhydrAMINE (BENADRYL) injection 50 mg  50 mg Intravenous Once PRN Georgiana Spinner, NP       famotidine (PEPCID) 20 MG tablet            fentaNYL (SUBLIMAZE) injection    PRN Annice Needy, MD   25 mcg at 02/20/23 1191   HYDROmorphone (DILAUDID) injection 1 mg  1 mg Intravenous Once PRN Georgiana Spinner, NP       midazolam (VERSED) 2 MG/ML syrup 8 mg  8 mg Oral Once PRN Georgiana Spinner, NP       midazolam (VERSED) injection    PRN Annice Needy, MD   1 mg at 02/20/23 0821   ondansetron (ZOFRAN) injection 4 mg  4 mg Intravenous Q6H PRN Georgiana Spinner, NP        Past Medical History:  Diagnosis Date   Abdominal distension 03/18/2012   Abnormal weight loss 10/30/2017   Acute kidney injury (HCC) 03/22/2012   Aneurysm (HCC)    right eye   Anxiety    Blind right eye    Cardiac arrest [facility])    Cataract    right eye   Claudication (HCC)    Complication of anesthesia 03/15/2012   "crashed" during first part of  3-part back surgery.    COPD (chronic obstructive pulmonary disease) (HCC)    Delirium 03/18/2012   Dyspnea    GERD (gastroesophageal reflux disease)    Heart murmur    Hypertension    Leaky heart valve    Oxygen deficit    uses O2 at night 2 liters   Peripheral vascular disease (HCC)    Scoliosis    Sepsis (HCC)    Shock (HCC) 03/23/2012   SIRS (systemic inflammatory response syndrome) (HCC) 03/23/2012   Spinal stenosis    Stroke [facility])    Eye   Tachycardia     Past Surgical History:  Procedure Laterality Date   BACK SURGERY     BREAST LUMPECTOMY Left    CAROTID PTA/STENT INTERVENTION Right 09/19/2022   Procedure: CAROTID PTA/STENT INTERVENTION;  Surgeon: Annice Needy, MD;  Location: [facility] INVASIVE CV LAB;  Service: Cardiovascular;  Laterality: Right;   CATARACT EXTRACTION W/PHACO Left 08/14/2019   Procedure: CATARACT EXTRACTION PHACO AND INTRAOCULAR LENS PLACEMENT (IOC) LEFT 10.22 00:59.7 42.4%;  Surgeon: Lockie Mola, MD;  Location: [facility];  Service: Ophthalmology;  Laterality: Left;  leave it last patient per [place]  LOWER EXTREMITY ANGIOGRAPHY Right 06/20/2018   Procedure: LOWER EXTREMITY ANGIOGRAPHY;  Surgeon: Annice Needy, MD;  Location: [facility] INVASIVE CV LAB;  Service: Cardiovascular;  Laterality: Right;   LOWER EXTREMITY ANGIOGRAPHY Left 02/03/2020   Procedure: LOWER EXTREMITY ANGIOGRAPHY;  Surgeon: Annice Needy, MD;  Location: [facility] INVASIVE CV LAB;  Service: Cardiovascular;  Laterality: Left;   right eye surgery     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   TOTAL ABDOMINAL HYSTERECTOMY     total   WISDOM TOOTH EXTRACTION       Social History   Tobacco Use   Smoking status: Every Day    Packs/day: 0.50    Years: 40.00    Additional pack years: 0.00    Total pack years: 20.00    Types: Cigarettes   Smokeless tobacco: Never   Tobacco comments:    0.5PPD 08/31/2021  Vaping Use   Vaping Use: Former  Substance Use Topics   Alcohol use: Yes    Comment:  wine on occasion (1x/month)   Drug use: No     Family History  Problem Relation Age of Onset   Intracerebral hemorrhage Mother    Hypertension Mother    Heart failure Father    Schizophrenia Daughter    Rheum arthritis Maternal Grandmother    Kidney disease Maternal Grandmother    Heart disease Paternal Grandmother    Stroke Paternal Grandmother    Stroke Paternal Grandfather    Heart disease Paternal Grandfather     Allergies  Allergen Reactions   Plavix [Clopidogrel] Other (See Comments)    States "I hurt so bad I couldn't get out of bed."   Other Itching    Cat Gut sutures    Oxycodone Hives   Amlodipine Hives   Atorvastatin Other (See Comments)    weakness   Augmentin [Amoxicillin-Pot Clavulanate] Diarrhea   Azithromycin Hives   Contrast Media [Iodinated Contrast Media] Hives   Doxycycline Diarrhea   Erythromycin Diarrhea   Hydrocodone Nausea Only   Lactose Intolerance (Gi) Diarrhea    Bloating   Minocycline Diarrhea   Nsaids Other (See Comments)    Internal Bleeding   Relafen [Nabumetone] Hives   Sulfa Antibiotics Hives   Synthroid [Levothyroxine] Diarrhea   Tetracyclines & Related Diarrhea   Tilactase    Tramadol Rash    Takes +/- Benadryl     REVIEW OF SYSTEMS (Negative unless checked)  Constitutional: [] Weight loss  [] Fever  [] Chills Cardiac: [] Chest pain   [] Chest pressure   [] Palpitations   [] Shortness of breath when laying flat   [] Shortness of breath at rest   [x] Shortness of breath with exertion. Vascular:  [x] Pain in legs with walking   [] Pain in legs at rest   [] Pain in legs when laying flat   [] Claudication   [] Pain in feet when walking  [x] Pain in feet at rest  [] Pain in feet when laying flat   [] History of DVT   [] Phlebitis   [] Swelling in legs   [] Varicose veins   [] Non-healing ulcers Pulmonary:   [] Uses home oxygen   [] Productive cough   [] Hemoptysis   [] Wheeze  [] COPD   [] Asthma Neurologic:  [] Dizziness  [] Blackouts   [] Seizures   [] History  of stroke   [] History of TIA  [] Aphasia   [] Temporary blindness   [] Dysphagia   [] Weakness or numbness in arms   [] Weakness or numbness in legs Musculoskeletal:  [x] Arthritis   [] Joint swelling   [x] Joint pain   [] Low back pain Hematologic:  []   Easy bruising  [] Easy bleeding   [x] Hypercoagulable state   [] Anemic  [] Hepatitis Gastrointestinal:  [] Blood in stool   [] Vomiting blood  [] Gastroesophageal reflux/heartburn   [] Difficulty swallowing. Genitourinary:  [] Chronic kidney disease   [] Difficult urination  [] Frequent urination  [] Burning with urination   [] Blood in urine Skin:  [] Rashes   [] Ulcers   [] Wounds Psychological:  [] History of anxiety   []  History of major depression.  Physical Examination  Vitals:   02/20/23 0730  BP: (!) 171/63  Pulse: 64  Resp: 15  Temp: (!) 97.5 F (36.4 C)  TempSrc: Oral  SpO2: 99%  Weight: 52.2 kg  Height: 5\' 3"  (1.6 m)   Body mass index is 20.37 kg/m. Gen: WD/WN, NAD Head: [place]/AT, No temporalis wasting.  Ear/Nose/Throat: Hearing grossly intact, nares w/o erythema or drainage, oropharynx w/o Erythema/Exudate,  Eyes: Conjunctiva clear, sclera cloudy Neck: Trachea midline.  No JVD.  Pulmonary:  Good air movement, respirations not labored, no use of accessory muscles.  Cardiac: RRR, normal S1, S2. Vascular:  Vessel Right Left  Radial Palpable Palpable                          PT Not Palpable 1+ Palpable  DP Not Palpable Not Palpable   Musculoskeletal: M/S 5/5 throughout.  Extremities without ischemic changes.  No deformity or atrophy.  Neurologic: Sensation grossly intact in extremities.  Symmetrical.  Speech is fluent. Motor exam as listed above. Psychiatric: Judgment intact, Mood & affect appropriate for pt's clinical situation. Dermatologic: No rashes or ulcers noted.  No cellulitis or open wounds.      CBC Lab Results  Component Value Date   WBC 14.5 (H) 11/29/2022   HGB 13.3 11/29/2022   HCT 41.1 11/29/2022   MCV 92 11/29/2022    PLT 403 11/29/2022    BMET    Component Value Date/Time   NA 143 01/10/2023 1326   NA 132 (L) 09/12/2012 1540   K 4.1 01/10/2023 1326   K 3.8 09/11/2012 0401   CL 103 01/10/2023 1326   CL 99 09/11/2012 0401   CO2 24 01/10/2023 1326   CO2 24 09/11/2012 0401   GLUCOSE 71 01/10/2023 1326   GLUCOSE 164 (H) 09/20/2022 0528   GLUCOSE 262 (H) 09/11/2012 0401   BUN 12 02/20/2023 0728   BUN 11 01/10/2023 1326   BUN 22 (H) 09/11/2012 0401   CREATININE 0.87 02/20/2023 0728   CREATININE 0.86 09/11/2012 0401   CALCIUM 9.0 01/10/2023 1326   CALCIUM 8.5 09/11/2012 0401   GFRNONAA >60 02/20/2023 0728   GFRNONAA >60 09/11/2012 0401   GFRAA 90 09/10/2020 1409   GFRAA >60 09/11/2012 0401   Estimated Creatinine Clearance: 41.1 mL/min (by C-G formula based on SCr of 0.87 mg/dL).  COAG Lab Results  Component Value Date   INR 1.4 (H) 07/11/2021   INR 1.3 (H) 07/10/2021   INR 1.07 10/07/2017    Radiology No results found.   Assessment/Plan 1.  PAD with disabling claudication symptoms bilaterally and rest pain symptoms on the right.  Angiography to be performed.  Plan on both lower extremities with right leg done first.  Risks and benefits were discussed. 2.  Carotid artery stenosis.  Status post carotid artery stenting in the past year.  Follow-up with duplex. 3.  Tobacco dependence.  The primary cause for vascular disease and would benefit from smoking cessation.   Festus Barren, MD  02/20/2023 8:25 AM

## 2023-02-20 NOTE — Op Note (Signed)
VASCULAR & VEIN SPECIALISTS  Percutaneous Study/Intervention Procedural Note   Date of Surgery: 02/20/2023  Surgeon(s):Azariyah Luhrs    Assistants:none  Pre-operative Diagnosis: PAD with bilateral lower extremity claudication with rest pain symptoms on the right  Post-operative diagnosis:  Same  Procedure(s) Performed:             1.  Ultrasound guidance for vascular access left femoral artery             2.  Catheter placement into right common femoral artery from left femoral approach             3.  Aortogram and selective bilateral lower extremity angiograms             4.  StarClose closure device left femoral artery  EBL: 5 cc  Contrast: 45 cc  Fluoro Time: 2 minutes  Moderate Conscious Sedation Time: approximately 31 minutes using 2 mg of Versed and 50 mcg of Fentanyl              Indications:  Patient is a 82 y.o.female with a long history of PAD and other vascular issues.  She has worsening claudication symptoms bilaterally and now has symptoms worrisome for rest pain in the right. The patient has noninvasive study showing markedly reduced ABI worse on the right than the left. The patient is brought in for angiography for further evaluation and potential treatment.  Due to the limb threatening nature of the situation, angiogram was performed for attempted limb salvage. The patient is aware that if the procedure fails, amputation would be expected.  The patient also understands that even with successful revascularization, amputation may still be required due to the severity of the situation.  Risks and benefits are discussed and informed consent is obtained.   Procedure:  The patient was identified and appropriate procedural time out was performed.  The patient was then placed supine on the table and prepped and draped in the usual sterile fashion. Moderate conscious sedation was administered during a face to face encounter with the patient throughout the procedure with my  supervision of the RN administering medicines and monitoring the patient's vital signs, pulse oximetry, telemetry and mental status throughout from the start of the procedure until the patient was taken to the recovery room. Ultrasound was used to evaluate the left common femoral artery.  It was small and somewhat diseased.  A digital ultrasound image was acquired.  A Seldinger needle was used to access the left common femoral artery under direct ultrasound guidance and a permanent image was performed.  A 0.035 J wire was advanced without resistance and a 5Fr sheath was placed.  Pigtail catheter was placed into the aorta and an AP aortogram was performed. This demonstrated normal renal arteries and normal aorta.  Both iliacs had been previously stented.  The right common iliac had some degree of narrowing superior to the previously placed stents, but this did not appear flow-limiting.  The left external iliac artery below the previously placed stents also had some degree of narrowing that appeared to be borderline flow-limiting. I then crossed the aortic bifurcation and advanced to the right femoral head. Selective right lower extremity angiogram was then performed. This demonstrated greater than 90% near occlusive stenosis of the right common femoral artery just below the circumflex vessels and extending down into the origin of the profunda femoris artery creating greater than 85% stenosis there as well.  The SFA had a flush occlusion.  There was then reconstitution  of the above-knee popliteal artery of a small vessel just above the knee.  There is then three-vessel runoff distally.  This will need to be addressed with either a femoral to popliteal bypass with extensive profunda endarterectomy or common femoral and profunda femoris endarterectomy with an attempted endovascular intervention for the SFA.  I elected to go ahead and image the left lower extremity as well and she was scheduled for this next week.  The  catheter was removed and a wire was placed through the left femoral sheath.  This demonstrated disease of the common femoral artery in the 60 to 70% range.  The profunda femoris artery appeared to only have mild disease proximally.  The SFA had a flush occlusion.  There is reconstitution of the popliteal artery just above the knee and three-vessel runoff distally.  I elected to terminate the procedure. The sheath was removed and StarClose closure device was deployed in the left femoral artery with excellent hemostatic result. The patient was taken to the recovery room in stable condition having tolerated the procedure well.  Findings:               Aortogram:  This demonstrated normal renal arteries and normal aorta.  Both iliacs had been previously stented.  The right common iliac had some degree of narrowing superior to the previously placed stents, but this did not appear flow-limiting.  The left external iliac artery below the previously placed stents also had some degree of narrowing that appeared to be borderline flow-limiting             Right lower Extremity:  This demonstrated greater than 90% near occlusive stenosis of the right common femoral artery just below the circumflex vessels and extending down into the origin of the profunda femoris artery creating greater than 85% stenosis there as well.  The SFA had a flush occlusion.  There was then reconstitution of the above-knee popliteal artery of a small vessel just above the knee.  There is then three-vessel runoff distally.  Left Lower Extremity: This demonstrated disease of the common femoral artery in the 60 to 70% range.  The profunda femoris artery appeared to only have mild disease proximally.  The SFA had a flush occlusion.  There is reconstitution of the popliteal artery just above the knee and three-vessel runoff distally.   Disposition: Patient was taken to the recovery room in stable condition having tolerated the procedure  well.  Complications: None  Festus Barren 02/20/2023 9:04 AM   This note was created with Dragon Medical transcription system. Any errors in dictation are purely unintentional.

## 2023-02-21 ENCOUNTER — Encounter: Payer: Self-pay | Admitting: Vascular Surgery

## 2023-02-23 ENCOUNTER — Telehealth (INDEPENDENT_AMBULATORY_CARE_PROVIDER_SITE_OTHER): Payer: Self-pay

## 2023-02-23 NOTE — Telephone Encounter (Signed)
Patient left a message regarding having a LLE angio on 02/27/23 with Dr. Wyn Quaker. I called the patient and left her know that procedure had been canceled and a referral had been sent to Dr. Mariah Milling for cardiac clearance for surgery on 05/02/23. Patient had questions about what that consisted of and I advised that she make an appt in our office to come in and speak with the provider to have her questions answered.

## 2023-02-27 ENCOUNTER — Encounter: Admission: RE | Payer: Self-pay | Source: Home / Self Care

## 2023-02-27 ENCOUNTER — Ambulatory Visit: Admission: RE | Admit: 2023-02-27 | Payer: Medicare Other | Source: Home / Self Care | Admitting: Vascular Surgery

## 2023-02-27 DIAGNOSIS — I70219 Atherosclerosis of native arteries of extremities with intermittent claudication, unspecified extremity: Secondary | ICD-10-CM

## 2023-02-27 SURGERY — LOWER EXTREMITY ANGIOGRAPHY
Anesthesia: Moderate Sedation | Site: Leg Lower | Laterality: Left

## 2023-03-31 ENCOUNTER — Other Ambulatory Visit: Payer: Self-pay

## 2023-03-31 ENCOUNTER — Encounter: Payer: Self-pay | Admitting: Medical Oncology

## 2023-03-31 ENCOUNTER — Emergency Department
Admission: EM | Admit: 2023-03-31 | Discharge: 2023-04-01 | Disposition: A | Discharge status: Home / Self Care | Payer: Medicare Other | Attending: Emergency Medicine | Admitting: Emergency Medicine

## 2023-03-31 DIAGNOSIS — R4182 Altered mental status, unspecified: Secondary | ICD-10-CM | POA: Diagnosis present

## 2023-03-31 DIAGNOSIS — I4510 Unspecified right bundle-branch block: Secondary | ICD-10-CM | POA: Diagnosis not present

## 2023-03-31 DIAGNOSIS — R0689 Other abnormalities of breathing: Secondary | ICD-10-CM | POA: Diagnosis not present

## 2023-03-31 DIAGNOSIS — F12922 Cannabis use, unspecified with intoxication with perceptual disturbance: Secondary | ICD-10-CM | POA: Diagnosis not present

## 2023-03-31 DIAGNOSIS — I10 Essential (primary) hypertension: Secondary | ICD-10-CM | POA: Diagnosis not present

## 2023-03-31 LAB — COMPREHENSIVE METABOLIC PANEL
ALT: 17 U/L (ref 0–44)
AST: 20 U/L (ref 15–41)
Albumin: 4.1 g/dL (ref 3.5–5.0)
Alkaline Phosphatase: 60 U/L (ref 38–126)
Anion gap: 11 (ref 5–15)
BUN: 12 mg/dL (ref 8–23)
CO2: 26 mmol/L (ref 22–32)
Calcium: 8.7 mg/dL — ABNORMAL LOW (ref 8.9–10.3)
Chloride: 104 mmol/L (ref 98–111)
Creatinine, Ser: 0.87 mg/dL (ref 0.44–1.00)
GFR, Estimated: >60 mL/min (ref >60)
Glucose, Bld: 116 mg/dL — ABNORMAL HIGH (ref 70–99)
Potassium: 3.1 mmol/L — ABNORMAL LOW (ref 3.5–5.1)
Sodium: 141 mmol/L (ref 135–145)
Total Bilirubin: 0.7 mg/dL (ref 0.3–1.2)
Total Protein: 6.9 g/dL (ref 6.5–8.1)

## 2023-03-31 LAB — CBC
HCT: 38.3 % (ref 36.0–46.0)
Hemoglobin: 12.3 g/dL (ref 12.0–15.0)
MCH: 30.3 pg (ref 26.0–34.0)
MCHC: 32.1 g/dL (ref 30.0–36.0)
MCV: 94.3 fL (ref 80.0–100.0)
Platelets: 292 10*3/uL (ref 150–400)
RBC: 4.06 MIL/uL (ref 3.87–5.11)
RDW: 14.5 % (ref 11.5–15.5)
WBC: 11.4 10*3/uL — ABNORMAL HIGH (ref 4.0–10.5)
nRBC: 0 % (ref 0.0–0.2)

## 2023-03-31 NOTE — ED Notes (Signed)
Family (daughterVictorino Dike) updated as to patient's status.

## 2023-03-31 NOTE — ED Notes (Signed)
Pts daughter [name] would like an update when possible. [phone]

## 2023-03-31 NOTE — ED Triage Notes (Signed)
Pt from home via ems- daughter called bc pt began having altered mental status after [place] [facility] to [place] a THC slushie that pt drank over half of. Pt is sleepy in triage but arousable.

## 2023-04-01 ENCOUNTER — Emergency Department: Payer: Medicare Other

## 2023-04-01 DIAGNOSIS — F12922 Cannabis use, unspecified with intoxication with perceptual disturbance: Secondary | ICD-10-CM | POA: Diagnosis not present

## 2023-04-01 LAB — URINALYSIS, ROUTINE W REFLEX MICROSCOPIC
Bilirubin Urine: NEGATIVE
Glucose, UA: NEGATIVE mg/dL
Hgb urine dipstick: NEGATIVE
Ketones, ur: 5 mg/dL — AB
Leukocytes,Ua: NEGATIVE
Nitrite: NEGATIVE
Protein, ur: NEGATIVE mg/dL
Specific Gravity, Urine: 1.01 (ref 1.005–1.030)
pH: 6 (ref 5.0–8.0)

## 2023-04-01 LAB — URINE DRUG SCREEN, QUALITATIVE (ARMC ONLY)
Amphetamines, Ur Screen: NOT DETECTED
Barbiturates, Ur Screen: NOT DETECTED
Benzodiazepine, Ur Scrn: NOT DETECTED
Cannabinoid 50 Ng, Ur ~~LOC~~: POSITIVE — AB
Cocaine Metabolite,Ur ~~LOC~~: NOT DETECTED
MDMA (Ecstasy)Ur Screen: NOT DETECTED
Methadone Scn, Ur: NOT DETECTED
Opiate, Ur Screen: NOT DETECTED
Phencyclidine (PCP) Ur S: NOT DETECTED
Tricyclic, Ur Screen: POSITIVE — AB

## 2023-04-01 MED ORDER — POTASSIUM CHLORIDE CRYS ER 20 MEQ PO TBCR
40.0000 meq | EXTENDED_RELEASE_TABLET | Freq: Once | ORAL | Status: AC
  Administered 2023-04-01: 40 meq via ORAL
  Filled 2023-04-01: qty 2

## 2023-04-01 MED ORDER — LACTATED RINGERS IV BOLUS
1000.0000 mL | Freq: Once | INTRAVENOUS | Status: AC
  Administered 2023-04-01: 1000 mL via INTRAVENOUS

## 2023-04-01 NOTE — ED Provider Notes (Signed)
[facility] Provider Note    Event Date/Time   First MD Initiated Contact with Patient 03/31/23 2350     (approximate)   History   Altered Mental Status   HPI  [place] Sav[place] Mcclure is [place] 82 y.o. female who presents to the ED for evaluation of Altered Mental Status   Review of vascular clinic visit from 2 months ago.  Increasing claudication symptoms.  DAPT.  Patient presents to the ED for evaluation of waning altered mentation in the setting of trying [place] THC slushee for the first time.  She reports drinking part of this to drink around noon today.  By the time I see here, due to prolonged wait times, it has been nearly 12 hours since she has drink this and she reports feeling better now.  She lives at home with children and is comfortable going back.  Physical Exam   Triage Vital Signs: ED Triage Vitals  Encounter Vitals Group     BP 03/31/23 1850 (!) 182/77     Systolic BP Percentile --      Diastolic BP Percentile --      Pulse Rate 03/31/23 1850 80     Resp 03/31/23 1850 18     Temp 03/31/23 1850 98 F (36.7 C)     Temp Source 03/31/23 1850 Oral     SpO2 03/31/23 1850 96 %     Weight 03/31/23 1851 114 lb 10.2 oz (52 kg)     Height 03/31/23 1851 5\' 3"  (1.6 m)     Head Circumference --      Peak Flow --      Pain Score 03/31/23 1851 0     Pain Loc --      Pain Education --      Exclude from Growth Chart --     Most recent vital signs: Vitals:   04/01/23 0230 04/01/23 0325  BP: (!) 150/87 (!) 192/73  Pulse: 60 78  Resp:  14  Temp:    SpO2: 97% 97%    General: Awake, no distress.  CV:  Good peripheral perfusion.  Resp:  Normal effort.  Abd:  No distention.  MSK:  No deformity noted.  Neuro:  No focal deficits appreciated. Other:     ED Results / Procedures / Treatments   Labs (all labs ordered are listed, but only abnormal results are displayed) Labs Reviewed  COMPREHENSIVE METABOLIC PANEL - Abnormal; Notable for the  following components:      Result Value   Potassium 3.1 (*)    Glucose, Bld 116 (*)    Calcium 8.7 (*)    All other components within normal limits  CBC - Abnormal; Notable for the following components:   WBC 11.4 (*)    All other components within normal limits  URINALYSIS, ROUTINE W REFLEX MICROSCOPIC - Abnormal; Notable for the following components:   Color, Urine YELLOW (*)    APPearance HAZY (*)    Ketones, ur 5 (*)    All other components within normal limits  URINE DRUG SCREEN, QUALITATIVE ([facility] ONLY) - Abnormal; Notable for the following components:   Tricyclic, Ur Screen POSITIVE (*)    Cannabinoid 50 Ng, Ur [employer] POSITIVE (*)    All other components within normal limits    EKG Sinus rhythm with [place] rate of 79 bpm.  Normal axis.  Right bundle.  No STEMI.  RADIOLOGY CT head interpreted by me without evidence of acute intracranial pathology  Official  radiology report(s): CT HEAD WO CONTRAST ( )  Result Date: 04/01/2023 CLINICAL DATA:  Altered mental status. EXAM: CT HEAD WITHOUT CONTRAST TECHNIQUE: Contiguous axial images were obtained from the base of the skull through the vertex without intravenous contrast. RADIATION DOSE REDUCTION: This exam was performed according to the departmental dose-optimization program which includes automated exposure control, adjustment of the mA and/or kV according to patient size and/or use of iterative reconstruction technique. COMPARISON:  March 06, 2011 FINDINGS: Brain: There is mild cerebral atrophy with widening of the extra-axial spaces and ventricular dilatation. There are areas of decreased attenuation within the white matter tracts of the supratentorial brain, consistent with microvascular disease changes. Small, bilateral chronic basal ganglia lacunar infarcts are seen. Vascular: Marked severity calcification of the bilateral cavernous carotid arteries is seen. Skull: Normal. Negative for fracture or focal lesion. Sinuses/Orbits: Chronic and  postoperative changes are seen involving the right globe. Other: None. IMPRESSION: 1. Generalized cerebral atrophy with chronic white matter small vessel ischemic changes. 2. No acute intracranial abnormality. 3. Small, bilateral chronic basal ganglia lacunar infarcts. Electronically Signed   By: Aram Candela M.D.   On: 04/01/2023 02:04    PROCEDURES and INTERVENTIONS:  Procedures  Medications  lactated ringers bolus 1,000 mL (0 mLs Intravenous Stopped 04/01/23 0324)  potassium chloride SA (KLOR-CON M) CR tablet 40 mEq (40 mEq Oral Given 04/01/23 0110)     IMPRESSION / MDM / ASSESSMENT AND PLAN / ED COURSE  I reviewed the triage vital signs and the nursing notes.  Differential diagnosis includes, but is not limited to, ICH, cannabis derivative side effect, polypharmacy  {Patient presents with symptoms of an acute illness or injury that is potentially life-threatening.  Patient presents with waning altered mentation in the setting of [place] THC drink.  She is quite hypertensive so we will obtain [place] Noncon CT to ensure no ICH.  Denies headache or other concerns.  Nonfocal exam.  Hypokalemia is replaced orally.  Reassuring CBC.  Urine with small ketones suggestive of dehydration.  Will provide gentle IV fluids, CT head and reassess.  Anticipate she will be suitable for outpatient management.      FINAL CLINICAL IMPRESSION(S) / ED DIAGNOSES   Final diagnoses:  Cannabis intoxication with perceptual disturbance (HCC)     Rx / DC Orders   ED Discharge Orders     None        Note:  This document was prepared using Dragon voice recognition software and may include unintentional dictation errors.   Delton Pra[place], MD 04/01/23 [phone]

## 2023-04-07 ENCOUNTER — Encounter: Payer: Self-pay | Admitting: Pulmonary Disease

## 2023-04-07 ENCOUNTER — Telehealth: Payer: Self-pay

## 2023-04-07 NOTE — Telephone Encounter (Signed)
Transition Care Management Unsuccessful Follow-up Telephone Call  Date of discharge and from where:  04/01/2023 [facility]  Attempts:  1st Attempt  Reason for unsuccessful TCM follow-up call:  No answer/busy  Ewart Carrera [facility]  [facility] Population Health Community Resource Care Guide   [email or web address]  ?? [phone]   Website: [email or web address]  [email or web address]

## 2023-04-07 NOTE — Telephone Encounter (Signed)
Transition Care Management Unsuccessful Follow-up Telephone Call  Date of discharge and from where:  04/01/2023 [facility]  Attempts:  2nd Attempt  Reason for unsuccessful TCM follow-up call:  Left voice message  Savannah Mcclure [facility]  [facility] Population Health Community Resource Care Guide   [email or web address]  ?? [phone]   Website: [email or web address]  [email or web address]

## 2023-04-10 DIAGNOSIS — Z961 Presence of intraocular lens: Secondary | ICD-10-CM | POA: Diagnosis not present

## 2023-04-10 DIAGNOSIS — H269 Unspecified cataract: Secondary | ICD-10-CM | POA: Diagnosis not present

## 2023-04-10 DIAGNOSIS — H44521 Atrophy of globe, right eye: Secondary | ICD-10-CM | POA: Diagnosis not present

## 2023-04-18 ENCOUNTER — Encounter: Payer: Medicare Other | Admitting: Student in an Organized Health Care Education/Training Program

## 2023-04-19 ENCOUNTER — Telehealth: Payer: Self-pay | Admitting: Family Medicine

## 2023-04-19 DIAGNOSIS — M47814 Spondylosis without myelopathy or radiculopathy, thoracic region: Secondary | ICD-10-CM

## 2023-04-19 DIAGNOSIS — M4120 Other idiopathic scoliosis, site unspecified: Secondary | ICD-10-CM

## 2023-04-19 DIAGNOSIS — G894 Chronic pain syndrome: Secondary | ICD-10-CM

## 2023-04-19 NOTE — Telephone Encounter (Signed)
Medication Refill - Medication: atenolol (TENORMIN) 25 MG tablet [   Has the patient contacted their pharmacy? No. (Agent: If no, request that the patient contact   Preferred Pharmacy (with phone number or street name): [id number] [place], [place] - [address]  Phone: [phone] Fax: [phone]  Has the patient been seen for an appointment in the last year OR does the patient have an upcoming appointment? Yes.    Agent: Please be advised that RX refills may take up to 3 business days. We ask that you follow-up with your pharmacy.

## 2023-04-19 NOTE — Telephone Encounter (Signed)
Patient needs to have all her meds sent to  [id number] - [place], [place] - [address]  Phone: [phone] Fax: [phone]  Patient stated her pharmacy is no longer in network for her so now [facility]

## 2023-04-20 MED ORDER — TICAGRELOR 90 MG PO TABS: 90.0000 mg | ORAL_TABLET | Freq: Two times a day (BID) | ORAL | 3 refills | Status: DC

## 2023-04-20 MED ORDER — OMEPRAZOLE 20 MG PO CPDR: 20.0000 mg | DELAYED_RELEASE_CAPSULE | Freq: Every day | ORAL | 1 refills | Status: DC

## 2023-04-20 MED ORDER — ATENOLOL 25 MG PO TABS: 25.0000 mg | ORAL_TABLET | Freq: Every day | ORAL | 1 refills | Status: DC

## 2023-04-20 MED ORDER — ROSUVASTATIN CALCIUM 5 MG PO TABS: 5.0000 mg | ORAL_TABLET | Freq: Every day | ORAL | 1 refills | Status: DC

## 2023-04-20 MED ORDER — THYROID 90 MG PO TABS: 90.0000 mg | ORAL_TABLET | ORAL | 3 refills | Status: DC

## 2023-04-20 MED ORDER — GABAPENTIN 300 MG PO CAPS
300.0000 mg | ORAL_CAPSULE | Freq: Two times a day (BID) | ORAL | 6 refills | Status: DC
Start: 2023-04-20 — End: 2023-06-08

## 2023-04-20 MED ORDER — BENAZEPRIL HCL 10 MG PO TABS: 10.0000 mg | ORAL_TABLET | Freq: Every day | ORAL | 1 refills | Status: DC

## 2023-04-20 NOTE — Telephone Encounter (Signed)
Routed to provider in previous TE for refills

## 2023-04-20 NOTE — Telephone Encounter (Signed)
Pharmacy is updated in chart routing to provider for refills

## 2023-04-26 ENCOUNTER — Telehealth: Payer: Self-pay | Admitting: *Deleted

## 2023-04-26 NOTE — Telephone Encounter (Signed)
LVM to verify card hx.

## 2023-05-01 NOTE — Progress Notes (Unsigned)
Cardiology Office Note  Date:  05/02/2023   ID:  Savannah Mcclure, DOB [birth year], MRN [id number]  PCP:  Dorcas Carrow, DO   Chief Complaint  Patient presents with   New Patient (Initial Visit)    Cardiac clearance for PAD; per Dr. Wyn Quaker. Patient c/o chest tightness & shortness of breath after over exerting. Medications reviewed by the patient verbally.     HPI:  [name] is a 82 year old woman with past medical history of PAD , bilateral SFA occlusion, bilateral carotid disease Hypertension Hyperlipidemia Smoker Sinus tachycardia, Low risk Myoview November 2019 referred by Dr. Wyn Quaker for PAD, CAD  Last seen by myself in clinic October 2019 Followed by vein and vascular carotid stent for high-grade disease with visual symptoms on the right  40 to 59% range on each side    ABIs 0.44 on the right and 0.55 on the left.   Lower extremity arterial angio February 20, 2023 Right lower extremity 90% near occlusive stenosis right common femoral artery below the circumflex vessels extending down to the origin of the profunda femoris artery creating greater than 85% stenosis there as well SFA with flush occlusion, reconstitution of the above-knee popliteal artery, three-vessel runoff distally Left lower extremity  60 to 70% common femoral, SFA with flush occlusion Left lower extremity  Surgery recommended of her lower extremity arterial disease  On discussion of her symptoms, she reports having SOB on exertion, occasional chest pain concerning for angina Leg pain on exertion To very limited  Still smoking 1/2 pdd Denies significant tachycardia  Lab work reviewed Total cholesterol 127 LDL 57  EKG personally reviewed by myself on todays visit EKG Interpretation Date/Time:  Tuesday May 02 2023 11:17:25 EDT Ventricular Rate:  59 PR Interval:  118 QRS Duration:  130 QT Interval:  470 QTC Calculation: 465 R Axis:   -87  Text Interpretation: Sinus  bradycardia Possible Left atrial enlargement Left axis deviation Right bundle branch block Inferior infarct (cited on or before 31-Oct-2017) When compared with ECG of 31-Mar-2023 18:56, Inverted T waves have replaced nonspecific T wave abnormality in Inferior leads Confirmed by Julien Nordmann [phone]) on 05/02/2023 11:27:25 AM   Other past medical history reviewed   CT chest May 2019 Moderate aortic atherosclerosis, three-vessel coronary atherosclerosis including left main noted Images pulled up and discussed with her today  Review of prior EKGs all through 2019 showing  tachycardia rate in the 130s  tachycardia noted on EKG in 2014  PMH:   has a past medical history of Abdominal distension (03/18/2012), Abnormal weight loss (10/30/2017), Acute kidney injury (HCC) (03/22/2012), Aneurysm (HCC), Anxiety, Blind right eye, Cardiac arrest (HCC), Cataract, Claudication (HCC), Complication of anesthesia (03/15/2012), COPD (chronic obstructive pulmonary disease) (HCC), Delirium (03/18/2012), Dyspnea, GERD (gastroesophageal reflux disease), Heart murmur, Hypertension, Leaky heart valve, Oxygen deficit, Peripheral vascular disease (HCC), Scoliosis, Sepsis (HCC), Shock (HCC) (03/23/2012), SIRS (systemic inflammatory response syndrome) (HCC) (03/23/2012), Spinal stenosis, Stroke (HCC), and Tachycardia.  PSH:    Past Surgical History:  Procedure Laterality Date   BACK SURGERY     BREAST LUMPECTOMY Left    CAROTID PTA/STENT INTERVENTION Right 09/19/2022   Procedure: CAROTID PTA/STENT INTERVENTION;  Surgeon: Annice Needy, MD;  Location: [facility];  Service: Cardiovascular;  Laterality: Right;   CATARACT EXTRACTION W/PHACO Left 08/14/2019   Procedure: CATARACT EXTRACTION PHACO AND INTRAOCULAR LENS PLACEMENT (IOC) LEFT 10.22 00:59.7 42.4%;  Surgeon: Lockie Mola, MD;  [facility];  Service: Ophthalmology;  Laterality:  Left;  leave it last patient per [place]   LOWER EXTREMITY  ANGIOGRAPHY Right 06/20/2018   Procedure: LOWER EXTREMITY ANGIOGRAPHY;  Surgeon: Annice Needy, MD;  Location: [facility];  Service: Cardiovascular;  Laterality: Right;   LOWER EXTREMITY ANGIOGRAPHY Left 02/03/2020   Procedure: LOWER EXTREMITY ANGIOGRAPHY;  Surgeon: Annice Needy, MD;  Location: [facility];  Service: Cardiovascular;  Laterality: Left;   LOWER EXTREMITY ANGIOGRAPHY Right 02/20/2023   Procedure: Lower Extremity Angiography;  Surgeon: Annice Needy, MD;  Location: [facility];  Service: Cardiovascular;  Laterality: Right;   right eye surgery     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 8 g 6   aspirin EC 81 MG tablet Take 1 tablet (81 mg total) by mouth daily at 6 (six) AM. Swallow whole. 30 tablet 12   atenolol (TENORMIN) 25 MG tablet Take 1 tablet (25 mg total) by mouth daily. 90 tablet 1   B-COMPLEX-C PO Take 1 tablet by mouth daily.     benazepril (LOTENSIN) 10 MG tablet Take 1 tablet (10 mg total) by mouth daily. 90 tablet 1   Collagen-Vitamin C-Biotin (COLLAGEN 1500/C) 500-50-0.8 MG CAPS Take by mouth.     cyclobenzaprine (FLEXERIL) 5 MG tablet Take 1 tablet (5 mg total) by mouth 3 (three) times daily as needed for muscle spasms. 30 tablet 6   diphenhydrAMINE (BENADRYL) 25 MG tablet Take 25 mg by mouth every 6 (six) hours as needed.      fluticasone (FLONASE) 50 MCG/ACT nasal spray 2 sprays into each nostril in the morning. Up to three sprays.     Fluticasone-Umeclidin-Vilant (TRELEGY ELLIPTA) 100-62.5-25 MCG/ACT AEPB Inhale 1 puff into the lungs daily for 1 day. 60 each 6   gabapentin (NEURONTIN) 300 MG capsule Take 1 capsule (300 mg total) by mouth 2 (two) times daily. 180  capsule 6   hydrOXYzine (ATARAX) 25 MG tablet Take 1 tablet (25 mg total) by mouth 3 (three) times daily as needed. 270 tablet 3   lidocaine (LIDODERM) 5 % Place 1 patch onto the skin daily. Remove & Discard patch within 12 hours or as directed by MD 60 patch 6   Misc Natural Products (JOINT HEALTH PO) Take by mouth daily.     montelukast (SINGULAIR) 10 MG tablet Take 1 tablet (10 mg total) by mouth at bedtime. 90 tablet 3   Multiple Vitamins-Minerals (MULTIVITAMIN WITH MINERALS) tablet Take 1 tablet by mouth daily.     NIACINAMIDE PO Take 1 capsule by mouth daily.     nitroGLYCERIN (NITROSTAT) 0.4 MG SL tablet Place 1 tablet (0.4 mg total) under the tongue every 5 (five) minutes as needed for chest pain. 25 tablet 3   omeprazole (PRILOSEC) 20 MG capsule Take 1 capsule (20 mg total) by mouth daily. 90 capsule 1   rosuvastatin (CRESTOR) 5 MG tablet Take 1 tablet (5 mg total) by mouth daily. 90 tablet 1   thyroid (ARMOUR THYROID) 90 MG tablet Take 1 tablet (90 mg total) by mouth every other day. 90 tablet 3   ticagrelor (BRILINTA) 90 MG TABS tablet Take 1 tablet (90 mg total) by mouth 2 (two)  times daily. 90 tablet 3   traMADol (ULTRAM) 50 MG tablet Take 1 tablet (50 mg total) by mouth every 6 (six) hours as needed. 120 tablet 2   ondansetron (ZOFRAN-ODT) 4 MG disintegrating tablet Take 1 tablet (4 mg total) by mouth every 8 (eight) hours as needed for nausea or vomiting. (Patient not taking: Reported on 05/02/2023) 20 tablet 0   Current Facility-Administered Medications  Medication Dose Route Frequency Provider Last Rate Last Admin   albuterol (PROVENTIL) (2.5 MG/3ML) 0.083% nebulizer solution 2.5 mg  2.5 mg Nebulization Once Johnson, Megan P, DO         Allergies:   Plavix [clopidogrel], Other, Oxycodone, Amlodipine, Atorvastatin, Augmentin [amoxicillin-pot clavulanate], Azithromycin, Contrast media [iodinated contrast media], Doxycycline, Erythromycin, Hydrocodone, Lactose intolerance (gi),  Minocycline, Nsaids, Relafen [nabumetone], Sulfa antibiotics, Synthroid [levothyroxine], Tetracyclines & related, Tilactase, and Tramadol   Social History:  The patient  reports that she has been smoking cigarettes. She has a 20 pack-year smoking history. She has never used smokeless tobacco. She reports current alcohol use. She reports that she does not use drugs.   Family History:   family history includes Heart disease in her paternal grandfather and paternal grandmother; Heart failure in her father; Hypertension in her mother; Intracerebral hemorrhage in her mother; Kidney disease in her maternal grandmother; Rheum arthritis in her maternal grandmother; Schizophrenia in her daughter; Stroke in her paternal grandfather and paternal grandmother.    Review of Systems: Review of Systems  Constitutional: Negative.   HENT: Negative.    Respiratory:  Positive for shortness of breath.   Cardiovascular:  Positive for chest pain.  Gastrointestinal: Negative.   Musculoskeletal: Negative.   Neurological: Negative.   Psychiatric/Behavioral: Negative.    All other systems reviewed and are negative.    PHYSICAL EXAM: VS:  BP (!) 150/70 (BP Location: Right Arm, Patient Position: Sitting, Cuff Size: Normal)   Pulse (!) 59   Ht 5\' 3"  (1.6 m)   Wt 115 lb 2 oz (52.2 kg)   SpO2 97%   BMI 20.39 kg/m  , BMI Body mass index is 20.39 kg/m. GEN: Well nourished, well developed, in no acute distress HEENT: normal Neck: no JVD, carotid bruits, or masses Cardiac: RRR; no murmurs, rubs, or gallops,no edema  Respiratory:  clear to auscultation bilaterally, normal work of breathing GI: soft, nontender, nondistended, + BS MS: no deformity or atrophy Skin: warm and dry, no rash Neuro:  Strength and sensation are intact Psych: euthymic mood, full affect   Recent Labs: 11/29/2022: Magnesium 1.6 01/10/2023: TSH 52.100 03/31/2023: ALT 17; BUN 12; Creatinine, Ser 0.87; Hemoglobin 12.3; Platelets 292; Potassium  3.1; Sodium 141    Lipid Panel Lab Results  Component Value Date   CHOL 127 01/10/2023   HDL 41 01/10/2023   LDLCALC 57 01/10/2023   TRIG 171 (H) 01/10/2023      Wt Readings from Last 3 Encounters:  05/02/23 115 lb 2 oz (52.2 kg)  03/31/23 114 lb 10.2 oz (52 kg)  02/20/23 115 lb (52.2 kg)       ASSESSMENT AND PLAN:  Problem List Items Addressed This Visit       Cardiology Problems   Atherosclerosis of native arteries of extremity with intermittent claudication (HCC)   Relevant Medications   nitroGLYCERIN (NITROSTAT) 0.4 MG SL tablet   Other Relevant Orders   EKG 12-Lead (Completed)   Other Visit Diagnoses     Atherosclerosis of artery of extremity with intermittent claudication (HCC)    -  Primary   Relevant Medications   nitroGLYCERIN (NITROSTAT) 0.4 MG SL tablet   Other Relevant Orders   EKG 12-Lead (Completed)   SOB (shortness of breath)       Relevant Orders   EKG 12-Lead (Completed)   NM Myocar Multi W/Spect W/Wall Motion / EF   Angina pectoris (HCC)       Relevant Medications   nitroGLYCERIN (NITROSTAT) 0.4 MG SL tablet   Other Relevant Orders   NM Myocar Multi W/Spect W/Wall Motion / EF   Mixed hyperlipidemia       Relevant Medications   nitroGLYCERIN (NITROSTAT) 0.4 MG SL tablet      Coronary disease, angina Reports having occasional chest pain concerning for angina With pending surgery and preop required, recommended pharmacologic Myoview, unable to treadmill.  No medication changes at this time On aspirin statin beta-blocker  PAD: Stress test as above for pending lower extremity arterial surgery  Hyperlipidemia Cholesterol is at goal on the current lipid regimen. No changes to the medications were made.  Essential hypertension Blood pressure is well controlled on today's visit. No changes made to the medications.   Total encounter time more than 60 minutes  Greater than 50% was spent in counseling and coordination of care with the  patient    Signed, Dossie Arbour, M.D., Ph.D. [facility] [place], [place] [phone]

## 2023-05-02 ENCOUNTER — Ambulatory Visit: Payer: Medicare Other | Attending: Cardiovascular Disease | Admitting: Cardiovascular Disease

## 2023-05-02 ENCOUNTER — Encounter: Payer: Self-pay | Admitting: Cardiovascular Disease

## 2023-05-02 VITALS — BP 150/70 | HR 59 | Ht 63.0 in | Wt 115.1 lb

## 2023-05-02 DIAGNOSIS — E782 Mixed hyperlipidemia: Secondary | ICD-10-CM | POA: Insufficient documentation

## 2023-05-02 DIAGNOSIS — I209 Angina pectoris, unspecified: Secondary | ICD-10-CM | POA: Diagnosis not present

## 2023-05-02 DIAGNOSIS — I70219 Atherosclerosis of native arteries of extremities with intermittent claudication, unspecified extremity: Secondary | ICD-10-CM | POA: Diagnosis not present

## 2023-05-02 DIAGNOSIS — I70213 Atherosclerosis of native arteries of extremities with intermittent claudication, bilateral legs: Secondary | ICD-10-CM | POA: Diagnosis not present

## 2023-05-02 DIAGNOSIS — R0602 Shortness of breath: Secondary | ICD-10-CM | POA: Insufficient documentation

## 2023-05-02 MED ORDER — NITROGLYCERIN 0.4 MG SL SUBL: 0.4000 mg | SUBLINGUAL_TABLET | SUBLINGUAL | 3 refills | Status: DC | PRN

## 2023-05-02 NOTE — Patient Instructions (Addendum)
Medication Instructions:  No changes  If you need a refill on your cardiac medications before your next appointment, please call your pharmacy.   Lab work: No new labs needed  Testing/Procedures: Your provider has ordered a Lexiscan/ Exercise Myoview Stress test. This will take place at [facility]. Please report to the [facility] medical mall entrance. The volunteers at the first desk will direct you where to go.  [facility] MYOVIEW  Your provider has ordered a Stress Test with nuclear imaging. The purpose of this test is to evaluate the blood supply to your heart muscle. This procedure is referred to as a "Non-Invasive Stress Test." This is because other than having an IV started in your vein, nothing is inserted or "invades" your body. Cardiac stress tests are done to find areas of poor blood flow to the heart by determining the extent of coronary artery disease (CAD). Some patients exercise on a treadmill, which naturally increases the blood flow to your heart, while others who are unable to walk on a treadmill due to physical limitations will have a pharmacologic/chemical stress agent called Lexiscan . This medicine will mimic walking on a treadmill by temporarily increasing your coronary blood flow.   Please note: these test may take anywhere between 2-4 hours to complete  How to prepare for your Myoview test:  Nothing to eat for 6 hours prior to the test No caffeine for 24 hours prior to test No smoking 24 hours prior to test. Your medication may be taken with water.  If your doctor stopped a medication because of this test, do not take that medication. Ladies, please do not wear dresses.  Skirts or pants are appropriate. Please wear a short sleeve shirt. No perfume, cologne or lotion. Wear comfortable walking shoes. No heels!   PLEASE NOTIFY THE OFFICE AT LEAST 24 HOURS IN ADVANCE IF YOU ARE UNABLE TO KEEP YOUR APPOINTMENT.  [phone] AND  PLEASE NOTIFY NUCLEAR MEDICINE AT [facility] AT LEAST 24 HOURS  IN ADVANCE IF YOU ARE UNABLE TO KEEP YOUR APPOINTMENT. [phone]   Follow-Up: At [facility], you and your health needs are our priority.  As part of our continuing mission to provide you with exceptional heart care, we have created designated Provider Care Teams.  These Care Teams include your primary Cardiologist (physician) and Advanced Practice Providers (APPs -  Physician Assistants and Nurse Practitioners) who all work together to provide you with the care you need, when you need it.  You will need a follow up appointment in 12 months  Providers on your designated Care Team:   Nicolasa Ducking, NP Eula Listen, PA-C Cadence Fransico Michael, [place]  COVID-19 Vaccine Information can be found at: [email or web address] For questions related to vaccine distribution or appointments, please email [email or web address] or call [phone].

## 2023-05-05 ENCOUNTER — Other Ambulatory Visit: Payer: Self-pay | Admitting: Family Medicine

## 2023-05-05 NOTE — Telephone Encounter (Signed)
Pharmacy in the computer is not correct. Please find out where she needs this sent. Also due for follow up in October- please make sure she has an appointment.

## 2023-05-08 ENCOUNTER — Other Ambulatory Visit: Payer: Self-pay

## 2023-05-11 ENCOUNTER — Ambulatory Visit
Admission: RE | Admit: 2023-05-11 | Discharge: 2023-05-11 | Disposition: A | Discharge status: Home / Self Care | Payer: Medicare Other | Source: Ambulatory Visit | Attending: Cardiovascular Disease | Admitting: Cardiovascular Disease

## 2023-05-11 DIAGNOSIS — I209 Angina pectoris, unspecified: Secondary | ICD-10-CM | POA: Diagnosis not present

## 2023-05-11 DIAGNOSIS — R0602 Shortness of breath: Secondary | ICD-10-CM | POA: Diagnosis not present

## 2023-05-11 LAB — NM MYOCAR MULTI W/SPECT W/WALL MOTION / EF
LV dias vol: 11 mL (ref 46–106)
LV sys vol: 3 mL
Nuc Stress EF: 73 %
Peak HR: 100 {beats}/min
Rest HR: 60 {beats}/min
Rest Nuclear Isotope Dose: 10.4 mCi
SDS: 0
SRS: 5
SSS: 6
ST Depression (mm): 0 mm
Stress Nuclear Isotope Dose: 30.5 mCi
TID: 0.92

## 2023-05-11 MED ORDER — TECHNETIUM TC 99M TETROFOSMIN IV KIT
30.4900 | PACK | Freq: Once | INTRAVENOUS | Status: AC | PRN
  Administered 2023-05-11: 30.49 via INTRAVENOUS

## 2023-05-11 MED ORDER — REGADENOSON 0.4 MG/5ML IV SOLN
0.4000 mg | Freq: Once | INTRAVENOUS | Status: AC
  Administered 2023-05-11: 0.4 mg via INTRAVENOUS

## 2023-05-11 MED ORDER — TECHNETIUM TC 99M TETROFOSMIN IV KIT
10.0000 | PACK | Freq: Once | INTRAVENOUS | Status: AC | PRN
  Administered 2023-05-11: 10.39 via INTRAVENOUS

## 2023-05-11 MED ORDER — HYDROXYZINE HCL 25 MG PO TABS: 25.0000 mg | ORAL_TABLET | Freq: Three times a day (TID) | ORAL | 3 refills | Status: DC | PRN

## 2023-05-11 NOTE — Addendum Note (Signed)
Addended by: Dorcas Carrow on: 05/11/2023 11:01 PM   Modules accepted: Orders

## 2023-05-11 NOTE — Telephone Encounter (Signed)
[facility] pharmacy in the computer is not a [employer] and is in [place]. This is not the correct pharmacy. Please find out what [facility] pharmacy she needs it to go to and I'll be happy to send it in.

## 2023-05-17 ENCOUNTER — Other Ambulatory Visit: Payer: Self-pay | Admitting: Student in an Organized Health Care Education/Training Program

## 2023-05-17 ENCOUNTER — Other Ambulatory Visit (INDEPENDENT_AMBULATORY_CARE_PROVIDER_SITE_OTHER): Payer: Self-pay | Admitting: Nurse Practitioner

## 2023-05-19 ENCOUNTER — Encounter: Payer: Self-pay | Admitting: Emergency Medicine

## 2023-05-30 ENCOUNTER — Telehealth: Payer: Self-pay | Admitting: Cardiovascular Disease

## 2023-05-30 NOTE — Telephone Encounter (Signed)
Attempted again to contact pt. No answer.

## 2023-05-30 NOTE — Telephone Encounter (Signed)
Attempted again to call pt, but call is going straight to vm.

## 2023-05-30 NOTE — Telephone Encounter (Signed)
Left message for pt to call back  °

## 2023-05-30 NOTE — Telephone Encounter (Signed)
Called patient and left message for call back.

## 2023-05-30 NOTE — Telephone Encounter (Signed)
Pt is returning call.  

## 2023-05-30 NOTE — Telephone Encounter (Signed)
Patient states she was returning call. Please advise  ?

## 2023-06-01 NOTE — Telephone Encounter (Signed)
Called patient and notified her of the following from Dr. Mariah Milling.  Stress test No significant ischemia Normal ejection fraction Mild to moderate aortic atherosclerosis noted   No further testing needed prior to vascular surgery, acceptable risk Will CC Dr. Wyn Quaker so he can proceed with surgery  Patient verbalizes understanding.

## 2023-06-08 ENCOUNTER — Ambulatory Visit: Payer: Medicare Other | Admitting: Family Medicine

## 2023-06-08 ENCOUNTER — Encounter: Payer: Self-pay | Admitting: Family Medicine

## 2023-06-08 VITALS — BP 115/61 | HR 72 | Ht 63.0 in | Wt 114.0 lb

## 2023-06-08 DIAGNOSIS — I25118 Atherosclerotic heart disease of native coronary artery with other forms of angina pectoris: Secondary | ICD-10-CM

## 2023-06-08 DIAGNOSIS — M47814 Spondylosis without myelopathy or radiculopathy, thoracic region: Secondary | ICD-10-CM

## 2023-06-08 DIAGNOSIS — I10 Essential (primary) hypertension: Secondary | ICD-10-CM | POA: Diagnosis not present

## 2023-06-08 DIAGNOSIS — D692 Other nonthrombocytopenic purpura: Secondary | ICD-10-CM | POA: Diagnosis not present

## 2023-06-08 DIAGNOSIS — J9611 Chronic respiratory failure with hypoxia: Secondary | ICD-10-CM

## 2023-06-08 DIAGNOSIS — I700 Atherosclerosis of aorta: Secondary | ICD-10-CM

## 2023-06-08 DIAGNOSIS — F419 Anxiety disorder, unspecified: Secondary | ICD-10-CM | POA: Diagnosis not present

## 2023-06-08 DIAGNOSIS — I70213 Atherosclerosis of native arteries of extremities with intermittent claudication, bilateral legs: Secondary | ICD-10-CM | POA: Diagnosis not present

## 2023-06-08 DIAGNOSIS — E785 Hyperlipidemia, unspecified: Secondary | ICD-10-CM

## 2023-06-08 DIAGNOSIS — J410 Simple chronic bronchitis: Secondary | ICD-10-CM

## 2023-06-08 DIAGNOSIS — I739 Peripheral vascular disease, unspecified: Secondary | ICD-10-CM | POA: Diagnosis not present

## 2023-06-08 DIAGNOSIS — D649 Anemia, unspecified: Secondary | ICD-10-CM | POA: Diagnosis not present

## 2023-06-08 DIAGNOSIS — Z23 Encounter for immunization: Secondary | ICD-10-CM | POA: Diagnosis not present

## 2023-06-08 DIAGNOSIS — E039 Hypothyroidism, unspecified: Secondary | ICD-10-CM

## 2023-06-08 LAB — MICROALBUMIN, URINE WAIVED
Creatinine, Urine Waived: 100 mg/dL (ref 10–300)
Microalb, Ur Waived: 30 mg/L — ABNORMAL HIGH (ref 0–19)
Microalb/Creat Ratio: <30 mg/g (ref <30)

## 2023-06-08 MED ORDER — ATENOLOL 25 MG PO TABS: 25.0000 mg | ORAL_TABLET | Freq: Every day | ORAL | 1 refills | Status: DC

## 2023-06-08 MED ORDER — TRELEGY ELLIPTA 100-62.5-25 MCG/ACT IN AEPB: INHALATION_SPRAY | RESPIRATORY_TRACT | 1 refills | Status: DC

## 2023-06-08 MED ORDER — HYDROXYZINE HCL 25 MG PO TABS: 25.0000 mg | ORAL_TABLET | Freq: Three times a day (TID) | ORAL | 3 refills | Status: DC | PRN

## 2023-06-08 MED ORDER — OMEPRAZOLE 20 MG PO CPDR: 20.0000 mg | DELAYED_RELEASE_CAPSULE | Freq: Every day | ORAL | 1 refills | Status: DC

## 2023-06-08 MED ORDER — BENAZEPRIL HCL 10 MG PO TABS: 10.0000 mg | ORAL_TABLET | Freq: Every day | ORAL | 1 refills | Status: DC

## 2023-06-08 MED ORDER — CYCLOBENZAPRINE HCL 5 MG PO TABS: 5.0000 mg | ORAL_TABLET | Freq: Three times a day (TID) | ORAL | 6 refills | Status: DC | PRN

## 2023-06-08 MED ORDER — MONTELUKAST SODIUM 10 MG PO TABS: 10.0000 mg | ORAL_TABLET | Freq: Every day | ORAL | 3 refills | Status: DC

## 2023-06-08 MED ORDER — GABAPENTIN 300 MG PO CAPS: 300.0000 mg | ORAL_CAPSULE | Freq: Two times a day (BID) | ORAL | 6 refills | Status: DC

## 2023-06-08 MED ORDER — ALBUTEROL SULFATE HFA 108 (90 BASE) MCG/ACT IN AERS: 2.0000 | INHALATION_SPRAY | Freq: Four times a day (QID) | RESPIRATORY_TRACT | 6 refills | Status: DC | PRN

## 2023-06-08 MED ORDER — ASPIRIN 81 MG PO TBEC: 81.0000 mg | DELAYED_RELEASE_TABLET | Freq: Every day | ORAL | 12 refills | Status: DC

## 2023-06-08 MED ORDER — ROSUVASTATIN CALCIUM 5 MG PO TABS: 5.0000 mg | ORAL_TABLET | Freq: Every day | ORAL | 1 refills | Status: DC

## 2023-06-08 NOTE — Progress Notes (Signed)
BP 115/61   Pulse 72   Ht 5\' 3"  (1.6 m)   Wt 114 lb (51.7 kg)   SpO2 91%   BMI 20.19 kg/m    Subjective:    Patient ID: Savannah Mcclure, female    DOB: 1941-02-06, 82 y.o.   MRN: [id number]  HPI: Savannah Mcclure is a 82 y.o. female  Chief Complaint  Patient presents with   Hypothyroidism   HYPERTENSION / HYPERLIPIDEMIA Satisfied with current treatment? yes Duration of hypertension: chronic BP monitoring frequency: not checking BP medication side effects: no Past BP meds: benazpril  Duration of hyperlipidemia: chronic Cholesterol medication side effects: no Cholesterol supplements: none Past cholesterol medications: crestor Medication compliance: excellent compliance Aspirin: yes Recent stressors: yes Recurrent headaches: no Visual changes: no Palpitations: no Dyspnea: no Chest pain: no Lower extremity edema: no Dizzy/lightheaded: no  COPD COPD status: controlled Satisfied with current treatment?: yes Oxygen use: no Dyspnea frequency:  Cough frequency:  Rescue inhaler frequency:   Limitation of activity: no Productive cough:  Last Spirometry:  Pneumovax: Up to Date Influenza: Up to Date  ANXIETY/DEPRESSION Duration: chronic Status:controlled Anxious mood: yes  Excessive worrying: no Irritability: no  Sweating: no Nausea: no Palpitations:no Hyperventilation: no Panic attacks: no Agoraphobia: no  Obscessions/compulsions: no Depressed mood: no    01/10/2023    1:09 PM 10/17/2022   11:10 AM 07/21/2022    3:03 PM 06/14/2022   11:05 AM 05/31/2022    3:09 PM  Depression screen PHQ 2/9  Decreased Interest 0 0 0 0 0  Down, Depressed, Hopeless 0 0 0 0 0  PHQ - 2 Score 0 0 0 0 0  Altered sleeping 1  0  1  Tired, decreased energy 1  0  0  Change in appetite 0  0  0  Feeling bad or failure about yourself  0  0  0  Trouble concentrating 0  0  0  Moving slowly or fidgety/restless 0  0  0  Suicidal thoughts 0  0  0  PHQ-9 Score 2  0   1  Difficult doing work/chores Not difficult at all  Not difficult at all  Not difficult at all   Anhedonia: no Weight changes: no Insomnia: no   Hypersomnia: no Fatigue/loss of energy: yes Feelings of worthlessness: no Feelings of guilt: no Impaired concentration/indecisiveness: no Suicidal ideations: no  Crying spells: no Recent Stressors/Life Changes: no   Relationship problems: no   Family stress: no     Financial stress: no    Job stress: no    Recent death/loss: no  HYPOTHYROIDISM Thyroid control status:stable Satisfied with current treatment? yes Medication side effects: no Medication compliance: excellent compliance Recent dose adjustment:no Fatigue: no Cold intolerance: no Heat intolerance: no Weight gain: no Weight loss: no Constipation: no Diarrhea/loose stools: no Palpitations: no Lower extremity edema: no Anxiety/depressed mood: no   Relevant past medical, surgical, family and social history reviewed and updated as indicated. Interim medical history since our last visit reviewed. Allergies and medications reviewed and updated.  Review of Systems  Constitutional: Negative.   Respiratory: Negative.    Cardiovascular: Negative.   Gastrointestinal: Negative.   Musculoskeletal: Negative.   Neurological: Negative.   Psychiatric/Behavioral: Negative.      Per HPI unless specifically indicated above     Objective:    BP 115/61   Pulse 72   Ht 5\' 3"  (1.6 m)   Wt 114 lb (51.7 kg)   SpO2 91%  BMI 20.19 kg/m   Wt Readings from Last 3 Encounters:  06/08/23 114 lb (51.7 kg)  05/02/23 115 lb 2 oz (52.2 kg)  03/31/23 114 lb 10.2 oz (52 kg)    Physical Exam Vitals and nursing note reviewed.  Constitutional:      General: She is not in acute distress.    Appearance: Normal appearance. She is not ill-appearing, toxic-appearing or diaphoretic.  HENT:     Head: Normocephalic and atraumatic.     Right Ear: External ear normal.     Left Ear: External  ear normal.     Nose: Nose normal.     Mouth/Throat:     Mouth: Mucous membranes are moist.     Pharynx: Oropharynx is clear.  Eyes:     General: No scleral icterus.       Right eye: No discharge.        Left eye: No discharge.     Extraocular Movements: Extraocular movements intact.     Conjunctiva/sclera: Conjunctivae normal.     Pupils: Pupils are equal, round, and reactive to light.  Cardiovascular:     Rate and Rhythm: Normal rate and regular rhythm.     Pulses: Normal pulses.     Heart sounds: Normal heart sounds. No murmur heard.    No friction rub. No gallop.  Pulmonary:     Effort: Pulmonary effort is normal. No respiratory distress.     Breath sounds: Normal breath sounds. No stridor. No wheezing, rhonchi or rales.  Chest:     Chest wall: No tenderness.  Musculoskeletal:        General: Normal range of motion.     Cervical back: Normal range of motion and neck supple.  Skin:    General: Skin is warm and dry.     Capillary Refill: Capillary refill takes less than 2 seconds.     Coloration: Skin is not jaundiced or pale.     Findings: No bruising, erythema, lesion or rash.  Neurological:     General: No focal deficit present.     Mental Status: She is alert and oriented to person, place, and time. Mental status is at baseline.  Psychiatric:        Mood and Affect: Mood normal.        Behavior: Behavior normal.        Thought Content: Thought content normal.        Judgment: Judgment normal.     Results for orders placed or performed during the hospital encounter of 05/11/23  NM Myocar Multi W/Spect W/Wall Motion / EF  Result Value Ref Range   Rest HR 60.0 bpm   Rest BP 158/73 mmHg   Peak HR 100 bpm   Peak BP 175/88 mmHg   ST Depression (mm) 0 mm   Rest Nuclear Isotope Dose 10.4 mCi   Stress Nuclear Isotope Dose 30.5 mCi   SSS 6.0    SRS 5.0    SDS 0.0    TID 0.92    LV sys vol 3.0 mL   LV dias vol 11.0 46 - 106 mL   Nuc Stress EF 73 %       Assessment & Plan:   Problem List Items Addressed This Visit       Cardiovascular and Mediastinum   Essential hypertension - Primary    Under good control on current regimen. Continue current regimen. Continue to monitor. Call with any concerns. Refills given. Labs drawn today.  Relevant Medications   aspirin EC 81 MG tablet   atenolol (TENORMIN) 25 MG tablet   benazepril (LOTENSIN) 10 MG tablet   rosuvastatin (CRESTOR) 5 MG tablet   Other Relevant Orders   CBC with Differential/Platelet   Comprehensive metabolic panel   Microalbumin, Urine Waived   PAD (peripheral artery disease) (HCC)    Will keep BP and cholesterol under good control. Continue to follow with vascular. Call with any concerns.       Relevant Medications   aspirin EC 81 MG tablet   atenolol (TENORMIN) 25 MG tablet   benazepril (LOTENSIN) 10 MG tablet   rosuvastatin (CRESTOR) 5 MG tablet   Other Relevant Orders   CBC with Differential/Platelet   Comprehensive metabolic panel   Aortic atherosclerosis (HCC)    Will keep BP and cholesterol under good control. Continue to follow with vascular. Call with any concerns.       Relevant Medications   aspirin EC 81 MG tablet   atenolol (TENORMIN) 25 MG tablet   benazepril (LOTENSIN) 10 MG tablet   rosuvastatin (CRESTOR) 5 MG tablet   Other Relevant Orders   CBC with Differential/Platelet   Comprehensive metabolic panel   Senile purpura (HCC)    Stable. Continue to monitor. Call with any concerns.       Relevant Medications   aspirin EC 81 MG tablet   atenolol (TENORMIN) 25 MG tablet   benazepril (LOTENSIN) 10 MG tablet   rosuvastatin (CRESTOR) 5 MG tablet   Other Relevant Orders   CBC with Differential/Platelet   Comprehensive metabolic panel   Coronary artery disease of native artery of native heart with stable angina pectoris (HCC)    Will keep BP and cholesterol under good control. Continue to follow with vascular. Call with any concerns.        Relevant Medications   aspirin EC 81 MG tablet   atenolol (TENORMIN) 25 MG tablet   benazepril (LOTENSIN) 10 MG tablet   cyclobenzaprine (FLEXERIL) 5 MG tablet   gabapentin (NEURONTIN) 300 MG capsule   rosuvastatin (CRESTOR) 5 MG tablet   Other Relevant Orders   CBC with Differential/Platelet   Comprehensive metabolic panel   RESOLVED: Atherosclerosis of native artery of both lower extremities with intermittent claudication (HCC)   Relevant Medications   aspirin EC 81 MG tablet   atenolol (TENORMIN) 25 MG tablet   benazepril (LOTENSIN) 10 MG tablet   cyclobenzaprine (FLEXERIL) 5 MG tablet   gabapentin (NEURONTIN) 300 MG capsule   rosuvastatin (CRESTOR) 5 MG tablet   Other Relevant Orders   CBC with Differential/Platelet   Comprehensive metabolic panel     Respiratory   COPD (chronic obstructive pulmonary disease) (HCC)    Under good control on current regimen. Continue current regimen. Continue to monitor. Call with any concerns. Refills given.        Relevant Medications   albuterol (VENTOLIN HFA) 108 (90 Base) MCG/ACT inhaler   Fluticasone-Umeclidin-Vilant (TRELEGY ELLIPTA) 100-62.5-25 MCG/ACT AEPB   montelukast (SINGULAIR) 10 MG tablet   Other Relevant Orders   CBC with Differential/Platelet   Comprehensive metabolic panel   Chronic respiratory failure with hypoxia (HCC)    Continue inhalers. Call with any concerns. Continue to monitor.       Relevant Orders   CBC with Differential/Platelet   Comprehensive metabolic panel     Endocrine   Hypothyroidism    Rechecking labs today. Await results. Treat as needed      Relevant Medications   atenolol (TENORMIN)  25 MG tablet   Other Relevant Orders   CBC with Differential/Platelet   Comprehensive metabolic panel   TSH     Musculoskeletal and Integument   Thoracic spondylosis without myelopathy   Relevant Medications   aspirin EC 81 MG tablet   cyclobenzaprine (FLEXERIL) 5 MG tablet   gabapentin  (NEURONTIN) 300 MG capsule     Other   Anxiety    Under good control on current regimen. Continue current regimen. Continue to monitor. Call with any concerns. Refills given.        Relevant Medications   hydrOXYzine (ATARAX) 25 MG tablet   Other Relevant Orders   CBC with Differential/Platelet   Comprehensive metabolic panel   Anemia    Rechecking labs today. Await results. Treat as needed.       Relevant Orders   CBC with Differential/Platelet   Comprehensive metabolic panel   Dyslipidemia    Under good control on current regimen. Continue current regimen. Continue to monitor. Call with any concerns. Refills given.  Labs drawn today.       Relevant Medications   rosuvastatin (CRESTOR) 5 MG tablet   Other Relevant Orders   CBC with Differential/Platelet   Comprehensive metabolic panel   Lipid Panel w/o Chol/HDL Ratio   Other Visit Diagnoses     Needs flu shot       Flu shot given today.   Relevant Orders   Flu Vaccine Trivalent High Dose (Fluad) (Completed)   Need for COVID-19 vaccine       Covid shot given today.   Relevant Orders   Pfizer Comirnaty Covid -19 Vaccine 65yrs and older (Completed)        Follow up plan: Return in about 6 months (around 12/07/2023).

## 2023-06-08 NOTE — Assessment & Plan Note (Signed)
Under good control on current regimen. Continue current regimen. Continue to monitor. Call with any concerns. Refills given.   

## 2023-06-08 NOTE — Assessment & Plan Note (Signed)
Continue inhalers. Call with any concerns. Continue to monitor.

## 2023-06-08 NOTE — Assessment & Plan Note (Signed)
Will keep BP and cholesterol under good control. Continue to follow with vascular. Call with any concerns.

## 2023-06-08 NOTE — Assessment & Plan Note (Signed)
Under good control on current regimen. Continue current regimen. Continue to monitor. Call with any concerns. Refills given. Labs drawn today.   

## 2023-06-08 NOTE — Assessment & Plan Note (Signed)
Stable. Continue to monitor. Call with any concerns.  ?

## 2023-06-08 NOTE — Assessment & Plan Note (Signed)
Rechecking labs today. Await results. Treat as needed.  °

## 2023-06-09 ENCOUNTER — Other Ambulatory Visit: Payer: Self-pay | Admitting: Family Medicine

## 2023-06-09 DIAGNOSIS — E039 Hypothyroidism, unspecified: Secondary | ICD-10-CM

## 2023-06-09 LAB — CBC WITH DIFFERENTIAL/PLATELET
Basophils Absolute: 0.1 10*3/uL (ref 0.0–0.2)
Basos: 1 %
EOS (ABSOLUTE): 0.4 10*3/uL (ref 0.0–0.4)
Eos: 4 %
Hematocrit: 37.3 % (ref 34.0–46.6)
Hemoglobin: 12 g/dL (ref 11.1–15.9)
Immature Grans (Abs): 0 10*3/uL (ref 0.0–0.1)
Immature Granulocytes: 0 %
Lymphocytes Absolute: 2.1 10*3/uL (ref 0.7–3.1)
Lymphs: 24 %
MCH: 30.8 pg (ref 26.6–33.0)
MCHC: 32.2 g/dL (ref 31.5–35.7)
MCV: 96 fL (ref 79–97)
Monocytes Absolute: 0.6 10*3/uL (ref 0.1–0.9)
Monocytes: 6 %
Neutrophils Absolute: 5.7 10*3/uL (ref 1.4–7.0)
Neutrophils: 65 %
Platelets: 309 10*3/uL (ref 150–450)
RBC: 3.9 x10E6/uL (ref 3.77–5.28)
RDW: 13.3 % (ref 11.7–15.4)
WBC: 8.9 10*3/uL (ref 3.4–10.8)

## 2023-06-09 LAB — TSH: TSH: 47.6 u[IU]/mL — ABNORMAL HIGH (ref 0.450–4.500)

## 2023-06-09 LAB — COMPREHENSIVE METABOLIC PANEL
ALT: 13 [IU]/L (ref 0–32)
AST: 20 [IU]/L (ref 0–40)
Albumin: 4.2 g/dL (ref 3.7–4.7)
Alkaline Phosphatase: 69 [IU]/L (ref 44–121)
BUN/Creatinine Ratio: 16 (ref 12–28)
BUN: 13 mg/dL (ref 8–27)
Bilirubin Total: 0.4 mg/dL (ref 0.0–1.2)
CO2: 24 mmol/L (ref 20–29)
Calcium: 9.1 mg/dL (ref 8.7–10.3)
Chloride: 103 mmol/L (ref 96–106)
Creatinine, Ser: 0.8 mg/dL (ref 0.57–1.00)
Globulin, Total: 2 g/dL (ref 1.5–4.5)
Glucose: 128 mg/dL — ABNORMAL HIGH (ref 70–99)
Potassium: 3.7 mmol/L (ref 3.5–5.2)
Sodium: 142 mmol/L (ref 134–144)
Total Protein: 6.2 g/dL (ref 6.0–8.5)
eGFR: 74 mL/min/{1.73_m2} (ref >59)

## 2023-06-09 LAB — LIPID PANEL W/O CHOL/HDL RATIO
Cholesterol, Total: 121 mg/dL (ref 100–199)
HDL: 35 mg/dL — ABNORMAL LOW (ref >39)
LDL Chol Calc (NIH): 52 mg/dL (ref 0–99)
Triglycerides: 208 mg/dL — ABNORMAL HIGH (ref 0–149)
VLDL Cholesterol Cal: 34 mg/dL (ref 5–40)

## 2023-06-09 MED ORDER — THYROID 130 MG PO TABS: 130.0000 mg | ORAL_TABLET | ORAL | 1 refills | Status: DC

## 2023-06-12 ENCOUNTER — Encounter: Payer: Self-pay | Admitting: Emergency Medicine

## 2023-06-12 ENCOUNTER — Telehealth (INDEPENDENT_AMBULATORY_CARE_PROVIDER_SITE_OTHER): Payer: Self-pay

## 2023-06-12 ENCOUNTER — Other Ambulatory Visit: Payer: Self-pay

## 2023-06-12 ENCOUNTER — Ambulatory Visit: Payer: Self-pay

## 2023-06-12 ENCOUNTER — Emergency Department
Admission: EM | Admit: 2023-06-12 | Discharge: 2023-06-12 | Disposition: A | Discharge status: Home / Self Care | Payer: Medicare Other | Source: Home / Self Care | Attending: Emergency Medicine | Admitting: Emergency Medicine

## 2023-06-12 ENCOUNTER — Emergency Department: Payer: Medicare Other

## 2023-06-12 DIAGNOSIS — D72829 Elevated white blood cell count, unspecified: Secondary | ICD-10-CM | POA: Insufficient documentation

## 2023-06-12 DIAGNOSIS — J440 Chronic obstructive pulmonary disease with acute lower respiratory infection: Secondary | ICD-10-CM | POA: Diagnosis present

## 2023-06-12 DIAGNOSIS — I739 Peripheral vascular disease, unspecified: Secondary | ICD-10-CM

## 2023-06-12 DIAGNOSIS — E874 Mixed disorder of acid-base balance: Secondary | ICD-10-CM | POA: Diagnosis present

## 2023-06-12 DIAGNOSIS — I70213 Atherosclerosis of native arteries of extremities with intermittent claudication, bilateral legs: Secondary | ICD-10-CM | POA: Diagnosis present

## 2023-06-12 DIAGNOSIS — I6521 Occlusion and stenosis of right carotid artery: Secondary | ICD-10-CM | POA: Diagnosis present

## 2023-06-12 DIAGNOSIS — N2581 Secondary hyperparathyroidism of renal origin: Secondary | ICD-10-CM | POA: Diagnosis not present

## 2023-06-12 DIAGNOSIS — N178 Other acute kidney failure: Secondary | ICD-10-CM | POA: Diagnosis present

## 2023-06-12 DIAGNOSIS — I2510 Atherosclerotic heart disease of native coronary artery without angina pectoris: Secondary | ICD-10-CM | POA: Insufficient documentation

## 2023-06-12 DIAGNOSIS — I2694 Multiple subsegmental pulmonary emboli without acute cor pulmonale: Secondary | ICD-10-CM | POA: Diagnosis not present

## 2023-06-12 DIAGNOSIS — Z515 Encounter for palliative care: Secondary | ICD-10-CM | POA: Diagnosis not present

## 2023-06-12 DIAGNOSIS — E785 Hyperlipidemia, unspecified: Secondary | ICD-10-CM | POA: Diagnosis present

## 2023-06-12 DIAGNOSIS — E039 Hypothyroidism, unspecified: Secondary | ICD-10-CM | POA: Diagnosis present

## 2023-06-12 DIAGNOSIS — F17210 Nicotine dependence, cigarettes, uncomplicated: Secondary | ICD-10-CM | POA: Diagnosis present

## 2023-06-12 DIAGNOSIS — I10 Essential (primary) hypertension: Secondary | ICD-10-CM | POA: Diagnosis present

## 2023-06-12 DIAGNOSIS — J9602 Acute respiratory failure with hypercapnia: Secondary | ICD-10-CM | POA: Diagnosis present

## 2023-06-12 DIAGNOSIS — J189 Pneumonia, unspecified organism: Secondary | ICD-10-CM | POA: Diagnosis not present

## 2023-06-12 DIAGNOSIS — G928 Other toxic encephalopathy: Secondary | ICD-10-CM | POA: Diagnosis not present

## 2023-06-12 DIAGNOSIS — I082 Rheumatic disorders of both aortic and tricuspid valves: Secondary | ICD-10-CM | POA: Diagnosis present

## 2023-06-12 DIAGNOSIS — A419 Sepsis, unspecified organism: Secondary | ICD-10-CM | POA: Diagnosis not present

## 2023-06-12 DIAGNOSIS — E440 Moderate protein-calorie malnutrition: Secondary | ICD-10-CM | POA: Diagnosis present

## 2023-06-12 DIAGNOSIS — F0390 Unspecified dementia without behavioral disturbance: Secondary | ICD-10-CM | POA: Diagnosis present

## 2023-06-12 DIAGNOSIS — J9601 Acute respiratory failure with hypoxia: Secondary | ICD-10-CM | POA: Diagnosis not present

## 2023-06-12 DIAGNOSIS — Z66 Do not resuscitate: Secondary | ICD-10-CM | POA: Diagnosis not present

## 2023-06-12 DIAGNOSIS — I2693 Single subsegmental pulmonary embolism without acute cor pulmonale: Secondary | ICD-10-CM | POA: Diagnosis not present

## 2023-06-12 DIAGNOSIS — J449 Chronic obstructive pulmonary disease, unspecified: Secondary | ICD-10-CM | POA: Insufficient documentation

## 2023-06-12 DIAGNOSIS — R6521 Severe sepsis with septic shock: Secondary | ICD-10-CM | POA: Diagnosis not present

## 2023-06-12 DIAGNOSIS — D631 Anemia in chronic kidney disease: Secondary | ICD-10-CM | POA: Diagnosis not present

## 2023-06-12 DIAGNOSIS — Z1152 Encounter for screening for COVID-19: Secondary | ICD-10-CM | POA: Diagnosis not present

## 2023-06-12 DIAGNOSIS — N179 Acute kidney failure, unspecified: Secondary | ICD-10-CM | POA: Diagnosis not present

## 2023-06-12 DIAGNOSIS — F32A Depression, unspecified: Secondary | ICD-10-CM | POA: Diagnosis present

## 2023-06-12 DIAGNOSIS — Y92239 Unspecified place in hospital as the place of occurrence of the external cause: Secondary | ICD-10-CM | POA: Diagnosis present

## 2023-06-12 DIAGNOSIS — I214 Non-ST elevation (NSTEMI) myocardial infarction: Secondary | ICD-10-CM | POA: Diagnosis not present

## 2023-06-12 DIAGNOSIS — R9431 Abnormal electrocardiogram [ECG] [EKG]: Secondary | ICD-10-CM | POA: Diagnosis not present

## 2023-06-12 DIAGNOSIS — R000 Tachycardia, unspecified: Secondary | ICD-10-CM | POA: Diagnosis not present

## 2023-06-12 LAB — CBC
HCT: 40.4 % (ref 36.0–46.0)
Hemoglobin: 13.1 g/dL (ref 12.0–15.0)
MCH: 30.1 pg (ref 26.0–34.0)
MCHC: 32.4 g/dL (ref 30.0–36.0)
MCV: 92.9 fL (ref 80.0–100.0)
Platelets: 338 10*3/uL (ref 150–400)
RBC: 4.35 MIL/uL (ref 3.87–5.11)
RDW: 14.3 % (ref 11.5–15.5)
WBC: 21.2 10*3/uL — ABNORMAL HIGH (ref 4.0–10.5)
nRBC: 0 % (ref 0.0–0.2)

## 2023-06-12 LAB — PROTIME-INR
INR: 1.3 — ABNORMAL HIGH (ref 0.8–1.2)
Prothrombin Time: 16.7 s — ABNORMAL HIGH (ref 11.4–15.2)

## 2023-06-12 LAB — BASIC METABOLIC PANEL
Anion gap: 14 (ref 5–15)
BUN: 18 mg/dL (ref 8–23)
CO2: 23 mmol/L (ref 22–32)
Calcium: 9 mg/dL (ref 8.9–10.3)
Chloride: 99 mmol/L (ref 98–111)
Creatinine, Ser: 1.08 mg/dL — ABNORMAL HIGH (ref 0.44–1.00)
GFR, Estimated: 51 mL/min — ABNORMAL LOW (ref >60)
Glucose, Bld: 167 mg/dL — ABNORMAL HIGH (ref 70–99)
Potassium: 3.4 mmol/L — ABNORMAL LOW (ref 3.5–5.1)
Sodium: 136 mmol/L (ref 135–145)

## 2023-06-12 MED ORDER — MORPHINE SULFATE (PF) 4 MG/ML IV SOLN: 4.0000 mg | Freq: Once | INTRAVENOUS | Status: DC

## 2023-06-12 MED ORDER — OXYCODONE HCL 5 MG PO TABS: 5.0000 mg | ORAL_TABLET | Freq: Three times a day (TID) | ORAL | 0 refills | Status: DC | PRN

## 2023-06-12 NOTE — Telephone Encounter (Signed)
Agree with EMS

## 2023-06-12 NOTE — ED Notes (Addendum)
Extremity BP's. ABI  RUE 161/66 LUE 154/72 RLE 94/66 LLE 109/63  [facility] notified.

## 2023-06-12 NOTE — Telephone Encounter (Signed)
Spoke with the patient and daughter to give them the information regarding her bilateral femoral endartectomies with Dr. Wyn Quaker on 06/15/23 at the [facility]. Pre-op is on 06/14/23 at 2:00 pm at the [facility]. Pre-surgical instructions were discussed and will be sent to Mychart.

## 2023-06-12 NOTE — Telephone Encounter (Signed)
  Chief Complaint: Cold swollen very painful legs Symptoms: Above Frequency: Ongoing  - much worse this weekend Pertinent Negatives: Patient denies  Disposition: [x] ED /[] Urgent Care (no appt availability in office) / [] Appointment(In office/virtual)/ []  [facility]/ [] Home Care/ [] Refused Recommended Disposition /[] [facility]/ []  Follow-up with PCP Additional Notes: Spoke with daughter [name], Pt was diagnosed with blood clots in her legs. Pt is in extreme pain. Both legs are swollen and cold.  Advised EMS.  Daughter does not think that pt will comply. Daughter is requesting a call back form Dr. Laural Benes.   Reason for Disposition  Unable to walk  Answer Assessment - Initial Assessment Questions 1. ONSET: "When did the pain start?"      months 2. LOCATION: "Where is the pain located?"      both 3. PAIN: "How bad is the pain?"    (Scale 1-10; or mild, moderate, severe)   -  MILD (1-3): doesn't interfere with normal activities    -  MODERATE (4-7): interferes with normal activities (e.g., work or school) or awakens from sleep, limping    -  SEVERE (8-10): excruciating pain, unable to do any normal activities, unable to walk     severe 4. WORK OR EXERCISE: "Has there been any recent work or exercise that involved this part of the body?"      Blood clot 5. CAUSE: "What do you think is causing the leg pain?"     Blood clot 6. OTHER SYMPTOMS: "Do you have any other symptoms?" (e.g., chest pain, back pain, breathing difficulty, swelling, rash, fever, numbness, weakness)     Pain  Protocols used: Leg Pain-A-AH

## 2023-06-12 NOTE — ED Provider Notes (Signed)
[facility] Provider Note    Event Date/Time   First MD Initiated Contact with Patient 06/12/23 1253     (approximate)   History   Chief Complaint Leg Pain   HPI  Savannah Mcclure is a 82 y.o. female with past medical history of hypertension, CAD, COPD, PAD, and chronic pain syndrome who presents to the ED complaining of leg pain.  Patient reports that she has had gradually worsening pain in both of her legs for the past couple of months which is worse when she goes to walk.  She states she has been following with vascular surgery for this, reports being told in July that she has significant blockage in blood flow to both legs, right greater than left.  She does state that the pain is worse on the right, but she reports minimal pain at rest.  She states she was supposed to follow-up for a procedure with vascular surgery, but had been waiting on clearance by her cardiologist.  She decided to come to the ED today due to worsening pain, has been taking tramadol without relief.     Physical Exam   Triage Vital Signs: ED Triage Vitals [06/12/23 1207]  Encounter Vitals Group     BP 103/82     Systolic BP Percentile      Diastolic BP Percentile      Pulse Rate 83     Resp 18     Temp 98.4 F (36.9 C)     Temp Source Oral     SpO2 100 %     Weight 115 lb (52.2 kg)     Height 5\' 3"  (1.6 m)     Head Circumference      Peak Flow      Pain Score 10     Pain Loc      Pain Education      Exclude from Growth Chart     Most recent vital signs: Vitals:   06/12/23 1329 06/12/23 1424  BP: (!) 164/73 (!) 154/72  Pulse:  86  Resp:  18  Temp:    SpO2:  97%    Constitutional: Alert and oriented. Eyes: Conjunctivae are normal. Head: Atraumatic. Nose: No congestion/rhinnorhea. Mouth/Throat: Mucous membranes are moist.  Cardiovascular: Normal rate, regular rhythm. Grossly normal heart sounds.  2+ radial pulses bilaterally.  Biphasic Doppler signals  to DP's bilaterally. Respiratory: Normal respiratory effort.  No retractions. Lungs CTAB. Gastrointestinal: Soft and nontender. No distention. Musculoskeletal: No lower extremity tenderness nor edema.  Neurologic:  Normal speech and language. No gross focal neurologic deficits are appreciated.    ED Results / Procedures / Treatments   Labs (all labs ordered are listed, but only abnormal results are displayed) Labs Reviewed  BASIC METABOLIC PANEL - Abnormal; Notable for the following components:      Result Value   Potassium 3.4 (*)    Glucose, Bld 167 (*)    Creatinine, Ser 1.08 (*)    GFR, Estimated 51 (*)    All other components within normal limits  CBC - Abnormal; Notable for the following components:   WBC 21.2 (*)    All other components within normal limits  PROTIME-INR - Abnormal; Notable for the following components:   Prothrombin Time 16.7 (*)    INR 1.3 (*)    All other components within normal limits    PROCEDURES:  Critical Care performed: No  Procedures   MEDICATIONS ORDERED IN ED: Medications - No data  to display   IMPRESSION / MDM / ASSESSMENT AND PLAN / ED COURSE  I reviewed the triage vital signs and the nursing notes.                              82 y.o. female with past medical history of hypertension, CAD, COPD, PAD, and chronic pain syndrome who presents to the ED who complains of gradually worsening pain in her bilateral lower extremities over the past month, especially when walking.  Patient's presentation is most consistent with acute presentation with potential threat to life or bodily function.  Differential diagnosis includes, but is not limited to, claudication, limb ischemia, DVT, cellulitis.  Patient nontoxic-appearing and in no acute distress, vital signs are unremarkable.  I am unable to palpate lower extremity pulses but she does have strong biphasic Doppler signals to both DPs.  No swelling or signs of infection noted, pain is  primarily when ambulating and I suspect symptoms are due to claudication.  ABI noted to be 0.58 on the right and 0.70 on the left, which is similar if not improved from 0.44 on the right and 0.55 on the left back in July.  Labs show leukocytosis without significant anemia, electrolyte abnormality, or AKI.  INR is unremarkable.  Findings reviewed with NP Manson Passey of vascular surgery, given minimal rest pain and reassuring ABIs, patient appropriate for outpatient management with close vascular surgery follow-up.  Patient to be prescribed short course of pain medication, was counseled to stop tramadol in favor of oxycodone for now.  She was counseled to return to the ED for new or worsening symptoms, patient agrees with plan.      FINAL CLINICAL IMPRESSION(S) / ED DIAGNOSES   Final diagnoses:  Claudication of both lower extremities (HCC)     Rx / DC Orders   ED Discharge Orders          Ordered    oxyCODONE (ROXICODONE) 5 MG immediate release tablet  Every 8 hours PRN        06/12/23 1411             Note:  This document was prepared using Dragon voice recognition software and may include unintentional dictation errors.   Chesley Noon, MD 06/12/23 [phone]

## 2023-06-12 NOTE — ED Triage Notes (Signed)
Patient to ED via POV for right leg pain. Pt reports being sent in by Dr Wyn Quaker for concern of blood clot. Hx of PAD with 10% of blood flow in right leg and 40% in left leg per pt. During this triage pt now stating both legs hurts just the right leg is hurting worse.

## 2023-06-13 ENCOUNTER — Other Ambulatory Visit (INDEPENDENT_AMBULATORY_CARE_PROVIDER_SITE_OTHER): Payer: Self-pay | Admitting: Nurse Practitioner

## 2023-06-13 DIAGNOSIS — I70213 Atherosclerosis of native arteries of extremities with intermittent claudication, bilateral legs: Secondary | ICD-10-CM

## 2023-06-14 ENCOUNTER — Other Ambulatory Visit: Payer: Self-pay

## 2023-06-14 ENCOUNTER — Emergency Department: Payer: Medicare Other

## 2023-06-14 ENCOUNTER — Encounter: Payer: Self-pay | Admitting: Urgent Care

## 2023-06-14 ENCOUNTER — Inpatient Hospital Stay
Admission: EM | Admit: 2023-06-14 | Discharge: 2023-07-16 | DRG: 871 | Disposition: E | Payer: Medicare Other | Attending: Student in an Organized Health Care Education/Training Program | Admitting: Student in an Organized Health Care Education/Training Program

## 2023-06-14 ENCOUNTER — Encounter
Admission: RE | Admit: 2023-06-14 | Discharge: 2023-06-14 | Disposition: A | Discharge status: Home / Self Care | Payer: Medicare Other | Source: Ambulatory Visit | Attending: Vascular Surgery | Admitting: Vascular Surgery

## 2023-06-14 DIAGNOSIS — I10 Essential (primary) hypertension: Secondary | ICD-10-CM | POA: Diagnosis present

## 2023-06-14 DIAGNOSIS — Z515 Encounter for palliative care: Secondary | ICD-10-CM | POA: Diagnosis not present

## 2023-06-14 DIAGNOSIS — F17210 Nicotine dependence, cigarettes, uncomplicated: Secondary | ICD-10-CM | POA: Diagnosis present

## 2023-06-14 DIAGNOSIS — G928 Other toxic encephalopathy: Secondary | ICD-10-CM | POA: Diagnosis present

## 2023-06-14 DIAGNOSIS — D631 Anemia in chronic kidney disease: Secondary | ICD-10-CM | POA: Diagnosis not present

## 2023-06-14 DIAGNOSIS — K219 Gastro-esophageal reflux disease without esophagitis: Secondary | ICD-10-CM | POA: Diagnosis present

## 2023-06-14 DIAGNOSIS — A419 Sepsis, unspecified organism: Secondary | ICD-10-CM | POA: Diagnosis present

## 2023-06-14 DIAGNOSIS — Z1152 Encounter for screening for COVID-19: Secondary | ICD-10-CM | POA: Diagnosis not present

## 2023-06-14 DIAGNOSIS — Z91041 Radiographic dye allergy status: Secondary | ICD-10-CM

## 2023-06-14 DIAGNOSIS — I2510 Atherosclerotic heart disease of native coronary artery without angina pectoris: Secondary | ICD-10-CM | POA: Diagnosis present

## 2023-06-14 DIAGNOSIS — N1411 Contrast-induced nephropathy: Secondary | ICD-10-CM | POA: Diagnosis present

## 2023-06-14 DIAGNOSIS — I2694 Multiple subsegmental pulmonary emboli without acute cor pulmonale: Secondary | ICD-10-CM | POA: Diagnosis not present

## 2023-06-14 DIAGNOSIS — J9602 Acute respiratory failure with hypercapnia: Secondary | ICD-10-CM | POA: Diagnosis present

## 2023-06-14 DIAGNOSIS — I2693 Single subsegmental pulmonary embolism without acute cor pulmonale: Secondary | ICD-10-CM | POA: Diagnosis present

## 2023-06-14 DIAGNOSIS — J440 Chronic obstructive pulmonary disease with acute lower respiratory infection: Secondary | ICD-10-CM | POA: Diagnosis present

## 2023-06-14 DIAGNOSIS — Z8249 Family history of ischemic heart disease and other diseases of the circulatory system: Secondary | ICD-10-CM

## 2023-06-14 DIAGNOSIS — F0390 Unspecified dementia without behavioral disturbance: Secondary | ICD-10-CM | POA: Diagnosis present

## 2023-06-14 DIAGNOSIS — E874 Mixed disorder of acid-base balance: Secondary | ICD-10-CM | POA: Diagnosis present

## 2023-06-14 DIAGNOSIS — Z881 Allergy status to other antibiotic agents status: Secondary | ICD-10-CM

## 2023-06-14 DIAGNOSIS — Y92239 Unspecified place in hospital as the place of occurrence of the external cause: Secondary | ICD-10-CM | POA: Diagnosis present

## 2023-06-14 DIAGNOSIS — Z7902 Long term (current) use of antithrombotics/antiplatelets: Secondary | ICD-10-CM

## 2023-06-14 DIAGNOSIS — R6521 Severe sepsis with septic shock: Secondary | ICD-10-CM | POA: Diagnosis present

## 2023-06-14 DIAGNOSIS — Z8674 Personal history of sudden cardiac arrest: Secondary | ICD-10-CM

## 2023-06-14 DIAGNOSIS — M545 Low back pain, unspecified: Secondary | ICD-10-CM | POA: Diagnosis present

## 2023-06-14 DIAGNOSIS — I70213 Atherosclerosis of native arteries of extremities with intermittent claudication, bilateral legs: Secondary | ICD-10-CM | POA: Diagnosis present

## 2023-06-14 DIAGNOSIS — Z90710 Acquired absence of both cervix and uterus: Secondary | ICD-10-CM

## 2023-06-14 DIAGNOSIS — Z8261 Family history of arthritis: Secondary | ICD-10-CM

## 2023-06-14 DIAGNOSIS — G894 Chronic pain syndrome: Secondary | ICD-10-CM | POA: Diagnosis present

## 2023-06-14 DIAGNOSIS — E785 Hyperlipidemia, unspecified: Secondary | ICD-10-CM | POA: Diagnosis present

## 2023-06-14 DIAGNOSIS — Z961 Presence of intraocular lens: Secondary | ICD-10-CM | POA: Diagnosis present

## 2023-06-14 DIAGNOSIS — I4510 Unspecified right bundle-branch block: Secondary | ICD-10-CM | POA: Diagnosis present

## 2023-06-14 DIAGNOSIS — Z01818 Encounter for other preprocedural examination: Secondary | ICD-10-CM

## 2023-06-14 DIAGNOSIS — Z9842 Cataract extraction status, left eye: Secondary | ICD-10-CM

## 2023-06-14 DIAGNOSIS — J9601 Acute respiratory failure with hypoxia: Secondary | ICD-10-CM | POA: Diagnosis not present

## 2023-06-14 DIAGNOSIS — E440 Moderate protein-calorie malnutrition: Secondary | ICD-10-CM | POA: Diagnosis present

## 2023-06-14 DIAGNOSIS — F32A Depression, unspecified: Secondary | ICD-10-CM | POA: Diagnosis present

## 2023-06-14 DIAGNOSIS — J189 Pneumonia, unspecified organism: Secondary | ICD-10-CM | POA: Diagnosis present

## 2023-06-14 DIAGNOSIS — Z79899 Other long term (current) drug therapy: Secondary | ICD-10-CM

## 2023-06-14 DIAGNOSIS — I6521 Occlusion and stenosis of right carotid artery: Secondary | ICD-10-CM | POA: Diagnosis present

## 2023-06-14 DIAGNOSIS — R9431 Abnormal electrocardiogram [ECG] [EKG]: Secondary | ICD-10-CM | POA: Diagnosis not present

## 2023-06-14 DIAGNOSIS — J449 Chronic obstructive pulmonary disease, unspecified: Secondary | ICD-10-CM | POA: Diagnosis not present

## 2023-06-14 DIAGNOSIS — E039 Hypothyroidism, unspecified: Secondary | ICD-10-CM | POA: Diagnosis present

## 2023-06-14 DIAGNOSIS — F1090 Alcohol use, unspecified, uncomplicated: Secondary | ICD-10-CM | POA: Diagnosis present

## 2023-06-14 DIAGNOSIS — Z66 Do not resuscitate: Secondary | ICD-10-CM | POA: Diagnosis not present

## 2023-06-14 DIAGNOSIS — M47814 Spondylosis without myelopathy or radiculopathy, thoracic region: Secondary | ICD-10-CM | POA: Diagnosis present

## 2023-06-14 DIAGNOSIS — T508X5A Adverse effect of diagnostic agents, initial encounter: Secondary | ICD-10-CM | POA: Diagnosis present

## 2023-06-14 DIAGNOSIS — Z7982 Long term (current) use of aspirin: Secondary | ICD-10-CM

## 2023-06-14 DIAGNOSIS — Z6820 Body mass index (BMI) 20.0-20.9, adult: Secondary | ICD-10-CM

## 2023-06-14 DIAGNOSIS — H5461 Unqualified visual loss, right eye, normal vision left eye: Secondary | ICD-10-CM | POA: Diagnosis present

## 2023-06-14 DIAGNOSIS — N178 Other acute kidney failure: Secondary | ICD-10-CM | POA: Diagnosis present

## 2023-06-14 DIAGNOSIS — N2581 Secondary hyperparathyroidism of renal origin: Secondary | ICD-10-CM | POA: Diagnosis not present

## 2023-06-14 DIAGNOSIS — I082 Rheumatic disorders of both aortic and tricuspid valves: Secondary | ICD-10-CM | POA: Diagnosis present

## 2023-06-14 DIAGNOSIS — R7401 Elevation of levels of liver transaminase levels: Secondary | ICD-10-CM | POA: Diagnosis present

## 2023-06-14 DIAGNOSIS — Z823 Family history of stroke: Secondary | ICD-10-CM

## 2023-06-14 DIAGNOSIS — Z8673 Personal history of transient ischemic attack (TIA), and cerebral infarction without residual deficits: Secondary | ICD-10-CM

## 2023-06-14 DIAGNOSIS — N179 Acute kidney failure, unspecified: Secondary | ICD-10-CM | POA: Diagnosis present

## 2023-06-14 DIAGNOSIS — I214 Non-ST elevation (NSTEMI) myocardial infarction: Secondary | ICD-10-CM | POA: Diagnosis present

## 2023-06-14 DIAGNOSIS — F1290 Cannabis use, unspecified, uncomplicated: Secondary | ICD-10-CM | POA: Diagnosis present

## 2023-06-14 DIAGNOSIS — F419 Anxiety disorder, unspecified: Secondary | ICD-10-CM | POA: Diagnosis present

## 2023-06-14 DIAGNOSIS — R000 Tachycardia, unspecified: Secondary | ICD-10-CM | POA: Diagnosis not present

## 2023-06-14 DIAGNOSIS — Z888 Allergy status to other drugs, medicaments and biological substances status: Secondary | ICD-10-CM

## 2023-06-14 DIAGNOSIS — I739 Peripheral vascular disease, unspecified: Secondary | ICD-10-CM | POA: Diagnosis not present

## 2023-06-14 DIAGNOSIS — Z818 Family history of other mental and behavioral disorders: Secondary | ICD-10-CM

## 2023-06-14 DIAGNOSIS — Z5982 Transportation insecurity: Secondary | ICD-10-CM

## 2023-06-14 LAB — CBC WITH DIFFERENTIAL/PLATELET
Abs Immature Granulocytes: 0.34 10*3/uL — ABNORMAL HIGH (ref 0.00–0.07)
Abs Immature Granulocytes: 0.17 10*3/uL — ABNORMAL HIGH (ref 0.00–0.07)
Basophils Absolute: 0 10*3/uL (ref 0.0–0.1)
Basophils Absolute: 0.1 10*3/uL (ref 0.0–0.1)
Basophils Relative: 0 %
Basophils Relative: 1 %
Eosinophils Absolute: 0 10*3/uL (ref 0.0–0.5)
Eosinophils Absolute: 0 10*3/uL (ref 0.0–0.5)
Eosinophils Relative: 0 %
Eosinophils Relative: 0 %
HCT: 41.3 % (ref 36.0–46.0)
HCT: 36.5 % (ref 36.0–46.0)
Hemoglobin: 13.9 g/dL (ref 12.0–15.0)
Hemoglobin: 12.1 g/dL (ref 12.0–15.0)
Immature Granulocytes: 2 %
Immature Granulocytes: 1 %
Lymphocytes Relative: 6 %
Lymphocytes Relative: 7 %
Lymphs Abs: 1.1 10*3/uL (ref 0.7–4.0)
Lymphs Abs: 0.9 10*3/uL (ref 0.7–4.0)
MCH: 30.2 pg (ref 26.0–34.0)
MCH: 30 pg (ref 26.0–34.0)
MCHC: 33.7 g/dL (ref 30.0–36.0)
MCHC: 33.2 g/dL (ref 30.0–36.0)
MCV: 89.8 fL (ref 80.0–100.0)
MCV: 90.6 fL (ref 80.0–100.0)
Monocytes Absolute: 0.8 10*3/uL (ref 0.1–1.0)
Monocytes Absolute: 0.7 10*3/uL (ref 0.1–1.0)
Monocytes Relative: 4 %
Monocytes Relative: 5 %
Neutro Abs: 16.6 10*3/uL — ABNORMAL HIGH (ref 1.7–7.7)
Neutro Abs: 12.2 10*3/uL — ABNORMAL HIGH (ref 1.7–7.7)
Neutrophils Relative %: 88 %
Neutrophils Relative %: 86 %
Platelets: 438 10*3/uL — ABNORMAL HIGH (ref 150–400)
Platelets: 347 10*3/uL (ref 150–400)
RBC: 4.6 MIL/uL (ref 3.87–5.11)
RBC: 4.03 MIL/uL (ref 3.87–5.11)
RDW: 14.5 % (ref 11.5–15.5)
RDW: 14.3 % (ref 11.5–15.5)
Smear Review: NORMAL
Smear Review: NORMAL
WBC: 19 10*3/uL — ABNORMAL HIGH (ref 4.0–10.5)
WBC: 14.2 10*3/uL — ABNORMAL HIGH (ref 4.0–10.5)
nRBC: 0 % (ref 0.0–0.2)
nRBC: 0 % (ref 0.0–0.2)

## 2023-06-14 LAB — COMPREHENSIVE METABOLIC PANEL
ALT: 25 U/L (ref 0–44)
AST: 57 U/L — ABNORMAL HIGH (ref 15–41)
Albumin: 3.9 g/dL (ref 3.5–5.0)
Alkaline Phosphatase: 83 U/L (ref 38–126)
Anion gap: 18 — ABNORMAL HIGH (ref 5–15)
BUN: 36 mg/dL — ABNORMAL HIGH (ref 8–23)
CO2: 20 mmol/L — ABNORMAL LOW (ref 22–32)
Calcium: 8.8 mg/dL — ABNORMAL LOW (ref 8.9–10.3)
Chloride: 99 mmol/L (ref 98–111)
Creatinine, Ser: 1.16 mg/dL — ABNORMAL HIGH (ref 0.44–1.00)
GFR, Estimated: 47 mL/min — ABNORMAL LOW (ref >60)
Glucose, Bld: 181 mg/dL — ABNORMAL HIGH (ref 70–99)
Potassium: 3.6 mmol/L (ref 3.5–5.1)
Sodium: 137 mmol/L (ref 135–145)
Total Bilirubin: 1.2 mg/dL (ref 0.3–1.2)
Total Protein: 8.8 g/dL — ABNORMAL HIGH (ref 6.5–8.1)

## 2023-06-14 LAB — URINE DRUG SCREEN, QUALITATIVE (ARMC ONLY)
Amphetamines, Ur Screen: NOT DETECTED
Barbiturates, Ur Screen: NOT DETECTED
Benzodiazepine, Ur Scrn: NOT DETECTED
Cannabinoid 50 Ng, Ur ~~LOC~~: POSITIVE — AB
Cocaine Metabolite,Ur ~~LOC~~: NOT DETECTED
MDMA (Ecstasy)Ur Screen: NOT DETECTED
Methadone Scn, Ur: NOT DETECTED
Opiate, Ur Screen: NOT DETECTED
Phencyclidine (PCP) Ur S: NOT DETECTED
Tricyclic, Ur Screen: POSITIVE — AB

## 2023-06-14 LAB — TROPONIN I (HIGH SENSITIVITY): Troponin I (High Sensitivity): 150 ng/L (ref <18)

## 2023-06-14 LAB — BLOOD GAS, ARTERIAL
Acid-base deficit: 9 mmol/L — ABNORMAL HIGH (ref 0.0–2.0)
Bicarbonate: 17.5 mmol/L — ABNORMAL LOW (ref 20.0–28.0)
FIO2: 100 %
MECHVT: 400 mL
Mechanical Rate: 15
O2 Saturation: 98.1 %
PEEP: 5 cmH2O
Patient temperature: 37
pCO2 arterial: 39 mm[Hg] (ref 32–48)
pH, Arterial: 7.26 — ABNORMAL LOW (ref 7.35–7.45)
pO2, Arterial: 91 mm[Hg] (ref 83–108)

## 2023-06-14 LAB — LACTIC ACID, PLASMA
Lactic Acid, Venous: 3.6 mmol/L (ref 0.5–1.9)
Lactic Acid, Venous: 2.2 mmol/L (ref 0.5–1.9)
Lactic Acid, Venous: 1.7 mmol/L (ref 0.5–1.9)
Lactic Acid, Venous: 2.9 mmol/L (ref 0.5–1.9)

## 2023-06-14 LAB — BLOOD GAS, VENOUS
Acid-base deficit: 8.4 mmol/L — ABNORMAL HIGH (ref 0.0–2.0)
Bicarbonate: 19.9 mmol/L — ABNORMAL LOW (ref 20.0–28.0)
O2 Saturation: 51 %
Patient temperature: 37
pCO2, Ven: 51 mm[Hg] (ref 44–60)
pH, Ven: 7.2 — ABNORMAL LOW (ref 7.25–7.43)
pO2, Ven: 35 mm[Hg] (ref 32–45)

## 2023-06-14 LAB — URINALYSIS, W/ REFLEX TO CULTURE (INFECTION SUSPECTED)
Bilirubin Urine: NEGATIVE
Glucose, UA: NEGATIVE mg/dL
Hgb urine dipstick: NEGATIVE
Ketones, ur: NEGATIVE mg/dL
Leukocytes,Ua: NEGATIVE
Nitrite: NEGATIVE
Protein, ur: 100 mg/dL — AB
Specific Gravity, Urine: 1.041 — ABNORMAL HIGH (ref 1.005–1.030)
pH: 5 (ref 5.0–8.0)

## 2023-06-14 LAB — ABO/RH: ABO/RH(D): O POS

## 2023-06-14 LAB — RESP PANEL BY RT-PCR (RSV, FLU A&B, COVID)  RVPGX2
Influenza A by PCR: NEGATIVE
Influenza B by PCR: NEGATIVE
Resp Syncytial Virus by PCR: NEGATIVE
SARS Coronavirus 2 by RT PCR: NEGATIVE

## 2023-06-14 LAB — TYPE AND SCREEN
ABO/RH(D): O POS
Antibody Screen: NEGATIVE

## 2023-06-14 LAB — PROCALCITONIN: Procalcitonin: 3.76 ng/mL

## 2023-06-14 LAB — APTT: aPTT: 26 s (ref 24–36)

## 2023-06-14 LAB — PROTIME-INR
INR: 1.4 — ABNORMAL HIGH (ref 0.8–1.2)
Prothrombin Time: 17.8 s — ABNORMAL HIGH (ref 11.4–15.2)

## 2023-06-14 LAB — PHOSPHORUS: Phosphorus: 3.5 mg/dL (ref 2.5–4.6)

## 2023-06-14 LAB — MAGNESIUM: Magnesium: 1.4 mg/dL — ABNORMAL LOW (ref 1.7–2.4)

## 2023-06-14 MED ORDER — POLYETHYLENE GLYCOL 3350 17 G PO PACK: 17.0000 g | PACK | Freq: Every day | ORAL | Status: DC | PRN

## 2023-06-14 MED ORDER — FENTANYL CITRATE PF 50 MCG/ML IJ SOSY: 25.0000 ug | PREFILLED_SYRINGE | INTRAMUSCULAR | Status: DC | PRN

## 2023-06-14 MED ORDER — SODIUM CHLORIDE 0.9% FLUSH
10.0000 mL | Freq: Two times a day (BID) | INTRAVENOUS | Status: DC
  Administered 2023-06-14 – 2023-06-17 (×7): 10 mL via INTRAVENOUS

## 2023-06-14 MED ORDER — SODIUM CHLORIDE 0.9 % IV BOLUS
1000.0000 mL | Freq: Once | INTRAVENOUS | Status: AC
Start: 2023-06-14 — End: 2023-06-14
  Administered 2023-06-14: 1000 mL via INTRAVENOUS

## 2023-06-14 MED ORDER — PROPOFOL 1000 MG/100ML IV EMUL
5.0000 ug/kg/min | INTRAVENOUS | Status: DC
  Administered 2023-06-14: 40 ug/kg/min via INTRAVENOUS
  Administered 2023-06-15: 35 ug/kg/min via INTRAVENOUS
  Administered 2023-06-15: 25.223 ug/kg/min via INTRAVENOUS
  Administered 2023-06-15: 40 ug/kg/min via INTRAVENOUS
  Administered 2023-06-16: 35 ug/kg/min via INTRAVENOUS
  Administered 2023-06-16: 20 ug/kg/min via INTRAVENOUS
  Administered 2023-06-16: 40 ug/kg/min via INTRAVENOUS
  Filled 2023-06-14 (×7): qty 100

## 2023-06-14 MED ORDER — ETOMIDATE 2 MG/ML IV SOLN
INTRAVENOUS | Status: AC | PRN
  Administered 2023-06-14: 15 mg via INTRAVENOUS

## 2023-06-14 MED ORDER — LEVOFLOXACIN IN D5W 750 MG/150ML IV SOLN
750.0000 mg | Freq: Once | INTRAVENOUS | Status: AC
  Administered 2023-06-14: 750 mg via INTRAVENOUS
  Filled 2023-06-14: qty 150

## 2023-06-14 MED ORDER — DIPHENHYDRAMINE HCL 25 MG PO CAPS
50.0000 mg | ORAL_CAPSULE | Freq: Once | ORAL | Status: AC
Start: 2023-06-14 — End: 2023-06-14
  Administered 2023-06-14: 50 mg via ORAL
  Filled 2023-06-14: qty 2

## 2023-06-14 MED ORDER — MIDAZOLAM HCL 5 MG/5ML IJ SOLN
3.0000 mg | Freq: Once | INTRAMUSCULAR | Status: AC
  Administered 2023-06-14: 3 mg via INTRAVENOUS
  Filled 2023-06-14: qty 5

## 2023-06-14 MED ORDER — FAMOTIDINE 20 MG PO TABS
20.0000 mg | ORAL_TABLET | Freq: Two times a day (BID) | ORAL | Status: DC
Start: 2023-06-14 — End: 2023-06-15

## 2023-06-14 MED ORDER — DIPHENHYDRAMINE HCL 50 MG/ML IJ SOLN: 50.0000 mg | Freq: Once | INTRAMUSCULAR | Status: AC

## 2023-06-14 MED ORDER — SODIUM CHLORIDE 0.9 % IV SOLN: 250.0000 mL | INTRAVENOUS | Status: AC

## 2023-06-14 MED ORDER — SODIUM CHLORIDE 0.9 % IV BOLUS: 1000.0000 mL | Freq: Once | INTRAVENOUS | Status: DC

## 2023-06-14 MED ORDER — MIDAZOLAM HCL 2 MG/2ML IJ SOLN: 1.0000 mg | INTRAMUSCULAR | Status: DC | PRN

## 2023-06-14 MED ORDER — PIPERACILLIN-TAZOBACTAM 3.375 G IVPB
3.3750 g | Freq: Three times a day (TID) | INTRAVENOUS | Status: DC
  Administered 2023-06-14 – 2023-06-17 (×8): 3.375 g via INTRAVENOUS
  Filled 2023-06-14 (×8): qty 50

## 2023-06-14 MED ORDER — PROPOFOL 1000 MG/100ML IV EMUL
INTRAVENOUS | Status: AC | PRN
  Administered 2023-06-14: 10 ug/kg/min via INTRAVENOUS

## 2023-06-14 MED ORDER — ETOMIDATE 2 MG/ML IV SOLN
15.0000 mg | Freq: Once | INTRAVENOUS | Status: DC
  Filled 2023-06-14: qty 10

## 2023-06-14 MED ORDER — DOCUSATE SODIUM 50 MG/5ML PO LIQD
100.0000 mg | Freq: Two times a day (BID) | ORAL | Status: DC
  Administered 2023-06-15 – 2023-06-17 (×6): 100 mg
  Filled 2023-06-14 (×6): qty 10

## 2023-06-14 MED ORDER — HEPARIN (PORCINE) 25000 UT/250ML-% IV SOLN
950.0000 [IU]/h | INTRAVENOUS | Status: DC
  Administered 2023-06-14: 850 [IU]/h via INTRAVENOUS
  Administered 2023-06-15 – 2023-06-16 (×2): 950 [IU]/h via INTRAVENOUS
  Filled 2023-06-14 (×3): qty 250

## 2023-06-14 MED ORDER — DOCUSATE SODIUM 100 MG PO CAPS: 100.0000 mg | ORAL_CAPSULE | Freq: Two times a day (BID) | ORAL | Status: DC | PRN

## 2023-06-14 MED ORDER — NOREPINEPHRINE 4 MG/250ML-% IV SOLN
INTRAVENOUS | Status: AC
  Filled 2023-06-14: qty 250

## 2023-06-14 MED ORDER — SUCCINYLCHOLINE CHLORIDE 200 MG/10ML IV SOSY
55.0000 mg | PREFILLED_SYRINGE | Freq: Once | INTRAVENOUS | Status: DC
  Filled 2023-06-14: qty 10

## 2023-06-14 MED ORDER — METHYLPREDNISOLONE SODIUM SUCC 40 MG IJ SOLR
40.0000 mg | Freq: Once | INTRAMUSCULAR | Status: AC
  Administered 2023-06-14: 40 mg via INTRAVENOUS
  Filled 2023-06-14: qty 1

## 2023-06-14 MED ORDER — IOHEXOL 350 MG/ML SOLN
75.0000 mL | Freq: Once | INTRAVENOUS | Status: AC | PRN
  Administered 2023-06-14: 75 mL via INTRAVENOUS

## 2023-06-14 MED ORDER — HEPARIN BOLUS VIA INFUSION
3500.0000 [IU] | Freq: Once | INTRAVENOUS | Status: AC
  Administered 2023-06-14: 3500 [IU] via INTRAVENOUS
  Filled 2023-06-14: qty 3500

## 2023-06-14 MED ORDER — POLYETHYLENE GLYCOL 3350 17 G PO PACK
17.0000 g | PACK | Freq: Every day | ORAL | Status: DC
  Administered 2023-06-15 – 2023-06-17 (×3): 17 g
  Filled 2023-06-14 (×3): qty 1

## 2023-06-14 MED ORDER — NOREPINEPHRINE 4 MG/250ML-% IV SOLN
2.0000 ug/min | INTRAVENOUS | Status: DC
Start: 2023-06-14 — End: 2023-06-17
  Administered 2023-06-14: 2 ug/min via INTRAVENOUS
  Administered 2023-06-15: 8 ug/min via INTRAVENOUS
  Administered 2023-06-15 – 2023-06-16 (×2): 6 ug/min via INTRAVENOUS
  Administered 2023-06-16: 8 ug/min via INTRAVENOUS
  Administered 2023-06-16: 5 ug/min via INTRAVENOUS
  Administered 2023-06-17: 10 ug/min via INTRAVENOUS
  Administered 2023-06-17: 6 ug/min via INTRAVENOUS
  Filled 2023-06-14 (×7): qty 250

## 2023-06-14 MED ORDER — PROPOFOL 1000 MG/100ML IV EMUL
INTRAVENOUS | Status: AC
  Filled 2023-06-14: qty 100

## 2023-06-14 MED ORDER — VANCOMYCIN HCL 1250 MG/250ML IV SOLN
1250.0000 mg | Freq: Once | INTRAVENOUS | Status: AC
  Administered 2023-06-14: 1250 mg via INTRAVENOUS
  Filled 2023-06-14: qty 250

## 2023-06-14 MED ORDER — ACETAMINOPHEN 325 MG RE SUPP
650.0000 mg | Freq: Once | RECTAL | Status: AC
  Administered 2023-06-14: 650 mg via RECTAL
  Filled 2023-06-14: qty 2

## 2023-06-14 MED ORDER — NOREPINEPHRINE BITARTRATE 1 MG/ML IV SOLN
INTRAVENOUS | Status: AC | PRN
  Administered 2023-06-14: 2 ug/kg/min via INTRAVENOUS

## 2023-06-14 MED ORDER — FENTANYL CITRATE PF 50 MCG/ML IJ SOSY
25.0000 ug | PREFILLED_SYRINGE | INTRAMUSCULAR | Status: DC | PRN
Start: 2023-06-14 — End: 2023-06-18
  Administered 2023-06-15 – 2023-06-17 (×4): 50 ug via INTRAVENOUS
  Filled 2023-06-14: qty 1

## 2023-06-14 MED ORDER — SUCCINYLCHOLINE CHLORIDE 20 MG/ML IJ SOLN
INTRAMUSCULAR | Status: AC | PRN
  Administered 2023-06-14: 55 mg via INTRAVENOUS

## 2023-06-14 MED ORDER — LACTATED RINGERS IV BOLUS
1000.0000 mL | Freq: Once | INTRAVENOUS | Status: AC
  Administered 2023-06-14: 1000 mL via INTRAVENOUS

## 2023-06-14 MED ORDER — VANCOMYCIN HCL 500 MG/100ML IV SOLN: 500.0000 mg | INTRAVENOUS | Status: DC

## 2023-06-14 MED ORDER — FENTANYL 2500MCG IN NS 250ML (10MCG/ML) PREMIX INFUSION
0.0000 ug/h | INTRAVENOUS | Status: DC
  Administered 2023-06-14: 75 ug/h via INTRAVENOUS
  Administered 2023-06-16: 125 ug/h via INTRAVENOUS
  Administered 2023-06-17: 75 ug/h via INTRAVENOUS
  Administered 2023-06-18: 150 ug/h via INTRAVENOUS
  Filled 2023-06-14 (×4): qty 250

## 2023-06-14 NOTE — ED Notes (Signed)
Per dr. Rosalia Hammers titrate to 30 mcg/kg/min

## 2023-06-14 NOTE — Consult Note (Signed)
PHARMACY - ANTICOAGULATION CONSULT NOTE  Pharmacy Consult for heparin infusion Indication: pulmonary embolus  Allergies  Allergen Reactions   Plavix [Clopidogrel] Other (See Comments)    States "I hurt so bad I couldn't get out of bed."   Other Itching    Cat Gut sutures    Oxycodone Hives   Amlodipine Hives   Atorvastatin Other (See Comments)    weakness   Augmentin [Amoxicillin-Pot Clavulanate] Diarrhea   Azithromycin Hives   Contrast Media [Iodinated Contrast Media] Hives   Doxycycline Diarrhea   Erythromycin Diarrhea   Hydrocodone Nausea Only   Lactose Intolerance (Gi) Diarrhea    Bloating   Minocycline Diarrhea   Nsaids Other (See Comments)    Internal Bleeding   Relafen [Nabumetone] Hives   Sulfa Antibiotics Hives   Synthroid [Levothyroxine] Diarrhea   Tetracyclines & Related Diarrhea   Tilactase    Tramadol Rash    Takes +/- Benadryl    Patient Measurements: Height: 5\' 3"  (160 cm) Weight: 52.2 kg (115 lb) IBW/kg (Calculated) : 52.4 Heparin Dosing Weight: 52.2 kg  Vital Signs: Temp: 98.8 F (37.1 C) (10/30 1753) Temp Source: Oral (10/30 1753) BP: 138/70 (10/30 2115) Pulse Rate: 143 (10/30 2115)  Labs: Recent Labs    06/12/23 1210 06/14/23 1353 06/14/23 1547  HGB 13.1 13.9 12.1  HCT 40.4 41.3 36.5  PLT 338 438* 347  LABPROT 16.7*  --  17.8*  INR 1.3*  --  1.4*  CREATININE 1.08* 1.16*  --     Estimated Creatinine Clearance: 30.8 mL/min (A) (by C-G formula based on SCr of 1.16 mg/dL (H)).   Medical History: Past Medical History:  Diagnosis Date   Abdominal distension 03/18/2012   Abnormal weight loss 10/30/2017   Acute kidney injury (HCC) 03/22/2012   Aneurysm (HCC)    right eye   Anxiety    Blind right eye    Cardiac arrest [facility])    Cataract    right eye   Claudication (HCC)    Complication of anesthesia 03/15/2012   "crashed" during first part of 3-part back surgery.    COPD (chronic obstructive pulmonary disease) (HCC)    Delirium  03/18/2012   Dyspnea    GERD (gastroesophageal reflux disease)    Heart murmur    Hypertension    Leaky heart valve    Oxygen deficit    uses O2 at night 2 liters   Peripheral vascular disease (HCC)    Scoliosis    Sepsis (HCC)    Shock (HCC) 03/23/2012   SIRS (systemic inflammatory response syndrome) (HCC) 03/23/2012   Spinal stenosis    Stroke (HCC)    Eye   Tachycardia     Medications:  No home anticoagulants per pharmacist review  Assessment: 82 yo female presented to the ED due to pain in lower back and legs.  Patient was scheduled for bilateral femoral vascular procedures on 10/31.  Imaging today reveal PE.  Pharmacy consulted to start heparin infusion.  Goal of Therapy:  Heparin level 0.3-0.7 units/ml Monitor platelets by anticoagulation protocol: Yes   Plan:  Give 3500 units bolus x 1 Start heparin infusion at 850 units/hr Check anti-Xa level in 8 hours and daily while on heparin Continue to monitor H&H and platelets  Barrie Folk, PharmD 06/14/2023,9:39 PM

## 2023-06-14 NOTE — ED Notes (Signed)
Britton-Lee at the bedside.

## 2023-06-14 NOTE — ED Notes (Signed)
RT to bedside

## 2023-06-14 NOTE — ED Notes (Signed)
Dr Rosalia Hammers to bedside

## 2023-06-14 NOTE — ED Notes (Signed)
Triage RN obtaining bloodwork and first set cultures then pt will go to 12H per CN.

## 2023-06-14 NOTE — ED Notes (Signed)
Critical lactic 2.9

## 2023-06-14 NOTE — ED Triage Notes (Addendum)
PT arrives via POV with daughter. PT reports she is scheduled to have bilateral femoral endartectomies tomorrow. Pt reports she has been in too much pain to wait until tomorrow. Pt reports pain in lower back and bilateral legs. Pt denies cp or sob, however patient's spo2 in triage is 86% RA and HR is 123. Pt is AxOx4.

## 2023-06-14 NOTE — Progress Notes (Signed)
Called to place patient on heated high flow oxygen. Placed patient on 50L @ 100% until breathing settles down.

## 2023-06-14 NOTE — ED Notes (Signed)
This RN and Geographical information systems officer at bedside to [employer] pt after finding of bed bugs. Pt's clothes and personal belongings double bagged and given to daughter at bedside who took belongings home.

## 2023-06-14 NOTE — ED Notes (Signed)
Lab called to collect second set of blood cultures.

## 2023-06-14 NOTE — ED Provider Notes (Signed)
Care of this patient assumed from prior physician at 1500 pending CTA, reevaluation, anticipated admission. Please see prior physician note for further details.  Briefly this is an 82 year old female who presented to the emergency department with back pain and shortness of breath as well as productive cough.  She was hypoxic and tachycardic on presentation.  Initial labs notable for leukocytosis, lactic acidosis at 3.6, improved with fluids.  She was ordered for antibiotics.  CTA was ordered, but patient did have a contrast allergy, so she did receive premedication delaying completion of this.  She initially presented tachycardic, but did have improvement down to a heart rate around 110 prior to her CT.  Upon return from CT, I was notified by the patient's RN that patient's heart rate was in the 140s, and she had hypoxia on 6 L with tachypnea.  I did go to evaluate the patient at bedside, she was tachycardic in the 140s to 160s, tachypneic with labored respirations, satting in the mid 80s on 6 L improved to low 90s on nonrebreather.  I reviewed her CT scan.  This demonstrated diffuse groundglass opacities in the bilateral lungs most notably over the right lung.  I did not note any large central PE, more difficult to evaluate for subsegmental PE due to significant bilateral opacities. Respiratory therapy was contacted and patient was placed on high flow nasal cannula.  She was titrated to 50 L 100% to maintain her sats in the low 90s but remained with persistent tachycardia despite additional fluid resuscitation for greater than 30 cc/kg of total fluid resuscitation.  VBG was obtained without evidence of hypercarbia, metabolic acidosis noted.  Given work of breathing, I did feel that patient was appropriate for intubation.  Did confirm with the patient that she would like to be intubated if indicated and is a full code.  Reports she has previously been intubated.  She did have some low blood pressures  preintubation for which she was briefly placed on Levophed, but was actually hypertensive postintubation this was weaned off.  She was intubated as below.  She was placed on sedation with propofol, remained and adequately sedated, was given Versed and the fentanyl drip was ordered.  Case was discussed with NP San Jetty with ICU team.  As we were discussing the case, patient CT did return with findings concerning for bilateral subsegmental PE.  Heparin drip ordered.  Patient will be admitted to the ICU for further management.  Procedure Name: Intubation Date/Time: 06/14/2023 10:49 PM  Performed by: Trinna Post, MDPre-anesthesia Checklist: Emergency Drugs available, Suction available, Patient identified, Patient being monitored and Timeout performed Preoxygenation: with HFNC. Induction Type: Rapid sequence Laryngoscope Size: Glidescope and 3 Grade View: Grade I Tube size: 7.0 mm Number of attempts: 1 Placement Confirmation: ETT inserted through vocal cords under direct vision Secured at: 22 cm Tube secured with: ETT holder     CRITICAL CARE Performed by: Trinna Post   Total critical care time: 55 minutes  Critical care time was exclusive of separately billable procedures and treating other patients.  Critical care was necessary to treat or prevent imminent or life-threatening deterioration.  Critical care was time spent personally by me on the following activities: development of treatment plan with patient and/or surrogate as well as nursing, discussions with consultants, evaluation of patient's response to treatment, examination of patient, obtaining history from patient or surrogate, ordering and performing treatments and interventions, ordering and review of laboratory studies, ordering and review of radiographic studies, pulse oximetry and  re-evaluation of patient's condition.     Trinna Post, MD 06/14/23 2250

## 2023-06-14 NOTE — ED Provider Notes (Signed)
[facility] Provider Note    Event Date/Time   First MD Initiated Contact with Patient 06/14/23 1400     (approximate)   History   hypoxia, Back Pain, and Leg Pain   HPI  Savannah Mcclure is a 82 y.o. female presents to the ER for evaluation of back pain shortness of breath.  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  Has been having productive cough.  Was found to be hypoxic and tachycardic.     Physical Exam   Triage Vital Signs: ED Triage Vitals  Encounter Vitals Group     BP 06/14/23 1330 (!) 140/79     Systolic BP Percentile --      Diastolic BP Percentile --      Pulse Rate 06/14/23 1330 (!) 123     Resp 06/14/23 1330 20     Temp 06/14/23 1330 98.2 F (36.8 C)     Temp src --      SpO2 06/14/23 1330 (!) 86 %     Weight 06/14/23 1329 115 lb (52.2 kg)     Height 06/14/23 1329 5\' 3"  (1.6 m)     Head Circumference --      Peak Flow --      Pain Score 06/14/23 1328 10     Pain Loc --      Pain Education --      Exclude from Growth Chart --     Most recent vital signs: Vitals:   06/14/23 1406 06/14/23 1530  BP: (!) 173/78 (!) 135/51  Pulse: (!) 120 (!) 119  Resp: (!) 25 (!) 32  Temp:    SpO2: 94% 94%     Constitutional: Alert  Eyes: Conjunctivae are normal.  Head: Atraumatic. Nose: No congestion/rhinnorhea. Mouth/Throat: Mucous membranes are moist.   Neck: Painless ROM.  Cardiovascular:   Good peripheral circulation. Respiratory: Mild tachypnea but requiring supplemental oxygen nasal cannula. Gastrointestinal: Soft and nontender.  Musculoskeletal:  no deformity Neurologic:  MAE spontaneously. No gross focal neurologic deficits are appreciated.  Skin:  Skin is warm, dry and intact. No rash noted. Psychiatric: Mood and affect are normal. Speech and behavior are normal.    ED Results / Procedures / Treatments   Labs (all labs ordered are listed, but  only abnormal results are displayed) Labs Reviewed  COMPREHENSIVE METABOLIC PANEL - Abnormal; Notable for the following components:      Result Value   CO2 20 (*)    Glucose, Bld 181 (*)    BUN 36 (*)    Creatinine, Ser 1.16 (*)    Calcium 8.8 (*)    Total Protein 8.8 (*)    AST 57 (*)    GFR, Estimated 47 (*)    Anion gap 18 (*)    All other components within normal limits  LACTIC ACID, PLASMA - Abnormal; Notable for the following components:   Lactic Acid, Venous 3.6 (*)    All other components within normal limits  CULTURE, BLOOD (ROUTINE X 2)  CULTURE, BLOOD (ROUTINE X 2)  RESP PANEL BY RT-PCR (RSV, FLU A&B, COVID)  RVPGX2  CULTURE, BLOOD (ROUTINE X 2)  CULTURE, BLOOD (ROUTINE X 2)  LACTIC ACID, PLASMA  CBC WITH DIFFERENTIAL/PLATELET  URINALYSIS, W/ REFLEX TO CULTURE (INFECTION SUSPECTED)  PROTIME-INR  LACTIC ACID, PLASMA  LACTIC ACID, PLASMA  CBC WITH DIFFERENTIAL/PLATELET  ABO/RH     EKG  ED ECG REPORT I, Luisa Hart  Roxan Hockey, the attending physician, personally viewed and interpreted this ECG.   Date: 06/14/2023  EKG Time: 13:53  Rate: 125  Rhythm: sinus  Axis: right  Intervals: rbbb  ST&T Change: no stemi, no depressions    RADIOLOGY Please see ED Course for my review and interpretation.  I personally reviewed all radiographic images ordered to evaluate for the above acute complaints and reviewed radiology reports and findings.  These findings were personally discussed with the patient.  Please see medical record for radiology report.    PROCEDURES:  Critical Care performed: Yes, see critical care procedure note(s)  .Critical Care  Performed by: Willy Eddy, MD Authorized by: Willy Eddy, MD   Critical care provider statement:    Critical care time (minutes):  35   Critical care was necessary to treat or prevent imminent or life-threatening deterioration of the following conditions:  Respiratory failure   Critical care was time spent  personally by me on the following activities:  Ordering and performing treatments and interventions, ordering and review of laboratory studies, ordering and review of radiographic studies, pulse oximetry, re-evaluation of patient's condition, review of old charts, obtaining history from patient or surrogate, examination of patient, evaluation of patient's response to treatment, discussions with primary provider, discussions with consultants and development of treatment plan with patient or surrogate    MEDICATIONS ORDERED IN ED: Medications  diphenhydrAMINE (BENADRYL) capsule 50 mg (has no administration in time range)    Or  diphenhydrAMINE (BENADRYL) injection 50 mg (has no administration in time range)  sodium chloride flush (NS) 0.9 % injection 10 mL ( Intravenous Canceled Entry 06/14/23 1537)  levofloxacin (LEVAQUIN) IVPB 750 mg (750 mg Intravenous New Bag/Given 06/14/23 1542)  sodium chloride 0.9 % bolus 1,000 mL (0 mLs Intravenous Stopped 06/14/23 1527)  methylPREDNISolone sodium succinate (SOLU-MEDROL) 40 mg/mL injection 40 mg (40 mg Intravenous Given 06/14/23 1537)     IMPRESSION / MDM / ASSESSMENT AND PLAN / ED COURSE  I reviewed the triage vital signs and the nursing notes.                              Differential diagnosis includes, but is not limited to, Asthma, copd, CHF, pna, ptx, malignancy, Pe, anemia  Patient presenting to the ER for evaluation of symptoms as described above.  Based on symptoms, risk factors and considered above differential, this presenting complaint could reflect a potentially life-threatening illness therefore the patient will be placed on continuous pulse oximetry and telemetry for monitoring.  Laboratory evaluation will be sent to evaluate for the above complaints.      Clinical Course as of 06/14/23 1606  Wed Jun 14, 2023  1452 Chest x-ray on my review and interpretation with some findings concerning for right pneumonia.  Lactate is significantly  elevated will order IV antibiotics. [PR]  1605 Receiving IV fluids.  I do suspect a large component of her lactic acidosis is secondary to hypoxia.  She is receiving antibiotics that work with her multiple allergies.  Have ordered CTA to further evaluate but patient does have contrast allergy so will need to be premedicated.  Patient to be signed out oncoming physician pending follow-up imaging.  Anticipate admission to this facility. [PR]    Clinical Course User Index [PR] Willy Eddy, MD     FINAL CLINICAL IMPRESSION(S) / ED DIAGNOSES   Final diagnoses:  Acute respiratory failure with hypoxia (HCC)     Rx / DC  Orders   ED Discharge Orders     None        Note:  This document was prepared using Dragon voice recognition software and may include unintentional dictation errors.    Willy Eddy, MD 06/14/23 [phone]

## 2023-06-14 NOTE — ED Notes (Signed)
Triage nurse Marcello Moores placing on [place] oxygen for RA sat of 86%.

## 2023-06-14 NOTE — Consult Note (Signed)
PHARMACY -  BRIEF ANTIBIOTIC NOTE   Pharmacy has received consult(s) for Levofloxacin from an ED provider.  The patient's profile has been reviewed for ht/wt/allergies/indication/available labs.    One time order(s) placed for Levofloxacin 750mg  IV.  Further antibiotics/pharmacy consults should be ordered by admitting physician if indicated.                       Thank you, Bettey Costa 06/14/2023  3:32 PM

## 2023-06-14 NOTE — ED Notes (Signed)
Patient to arrive from CT. Patient has an oxygen saturation of 87. RN to sit patient up in the bed at this time. No improvement noted. Patient tachypneic. RN to alert DR. Rosalia Hammers. DR to bedside.

## 2023-06-14 NOTE — H&P (Incomplete)
NAME:  Savannah Mcclure, MRN:  [id number], DOB:  August 24, 1940, LOS: 0 ADMISSION DATE:  06/09/2023, CONSULTATION DATE:  06/15/2023 REFERRING MD:  Dr. Hassie Bruce, CHIEF COMPLAINT:  shortness of breath, leg/back pain   History of Present Illness:  82 year old female presenting to [facility] ED from home on 06/08/2023 for evaluation of bilateral lower extremity lower back pain as well as shortness of breath.  History provided per chart review and daughter's telephone interview, patient unable to participate in interview at this time. On arrival to EDP patient reporting significant lower back and bilateral lower extremity pain as well as shortness of breath.  Patient confirmed productive cough with EDP on arrival.  Daughter confirms the patient has been experiencing chest pain, fever and chills as well as a new cough. Patient was scheduled for a bilateral femoral endarterectomies on 06/15/2023 after being followed outpatient by vascular surgery for severe PAD.  Daughter reports the patient's pain has not been well-controlled at home.  The patient has been unable to walk, screaming out at times with correlating shortness of breath and " blue lips".  The patient has been telling the daughter she feels as though she is 'on her way out'. The daughter found her current state extremely upsetting stating she "did not want to watch her die".  Daughter had called EMS 3 times, between 10/28 and 10/30, but the patient refused to be taken to the ED. daughter confirms the patient smokes 2 packs of cigarettes daily and has been for the past 5 years prior to that she reports a 50+ pack year history.  Daughter also confirms intermittent marijuana use and some EtOH use but not daily. Of note patient was seen in the ED on 06/12/2023 and prescribed short course of oxycodone.  ED course: Upon arrival patient alert and responsive- tachycardic, tachypneic and hypoxic at 86% on room air.  Lab work concerning for sepsis with lactic  acidosis and leukocytosis, mild AKI and NAGMA as well as mild transaminitis.  Initial chest x-ray concerning for pneumonia.  Sepsis protocol initiated with empiric antibiotics and IV fluid resuscitation.  CT angio ordered the patient has contrast allergy requiring premedication.  After CT angio patient's hypoxia, tachycardia and work of breathing worsened significantly ultimately requiring urgent intubation and mechanical ventilatory support.  CT angio positive for PE without right heart strain as well as significant bilateral groundglass opacities.  Medications given: Benadryl, heparin bolus and drip initiation, Solu-Medrol, Versed/etomidate/succinylcholine, Levaquin, 3 L IV fluid bolus, propofol and fentanyl drip started, IV contrast Initial Vitals: 98.2, 25, 123, 140/79 and 86% on room air Significant labs: (Labs/ Imaging personally reviewed) I, Cheryll Cockayne Rust-Chester, AGACNP-BC, personally viewed and interpreted this ECG. EKG Interpretation: Date: 06/13/2023, EKG Time: 21:11, Rate: 143, Rhythm: Wide QRS tachycardia, QRS Axis: Extreme RAD, Intervals: RBBB, prolonged QTc, ST/T Wave abnormalities: Nonspecific T wave inversions, Narrative Interpretation: Wide QRS tachycardia with RBBB Chemistry: Na+: 137, K+: 3.6, BUN/Cr.:  36/1.16, Serum CO2/ AG: 20/18, AST: 57 Hematology: WBC: 19, Hgb: 13.9,  Troponin: 150, Lactic/ PCT: 3.6> 2.2> 1.7> 2.9/3.76,  COVID-19 & Influenza A/B: Negative  VBG: 7.2/51/35/19.9 >> ABG: 7.26/39/91/17.5 CXR 05/18/2023: Interval development of nodular densities throughout both lungs concerning for possible metastatic disease or inflammation CT angio chest PE 06/12/2023: . Findings suspicious for small subsegmental pulmonary emboli in the right lower lobe and left upper lobe. Limited evaluation of the lung bases secondary to respiratory motion artifact. No evidence for right heart strain. New diffuse multifocal ground-glass and airspace disease throughout both lungs,  most  significant in the right lower lobe. Findings are favored as infectious/inflammatory. Stable chronic parenchymal opacities with bronchiectasis in the right upper lobe. New mild mediastinal lymphadenopathy, likely reactive. Aortic atherosclerosis.  PCCM consulted for admission due to acute hypoxic respiratory failure secondary to acute PE and multifocal pneumonia in the setting of COPD and severe PAD.  Pertinent  Medical History  Blind right eye COPD Hypertension PAD CVA Hyperlipidemia Anxiety & depression CAD Hypothyroidism Thoracic spondylosis without myelopathy Significant Hospital Events: Including procedures, antibiotic start and stop dates in addition to other pertinent events   06/17/2023: Admit patient to ICU with acute hypoxic respiratory failure secondary to acute PE and multifocal pneumonia in the setting of COPD and severe PAD.  Interim History / Subjective:  Patient intubated and sedated unable to follow commands.  Spoke with daughter [name] who provided most of recent history.  Plan of care discussed all questions and concerns answered at this time.  Objective   Blood pressure 138/70, pulse (!) 143, temperature 98.8 F (37.1 C), temperature source Oral, resp. rate (!) 29, height 5\' 3"  (1.6 m), weight 52.2 kg, SpO2 93%.    Vent Mode: PRVC FiO2 (%):  [100 %] 100 % Set Rate:  [15 bmp] 15 bmp Vt Set:  [400 mL] 400 mL PEEP:  [5 cmH20] 5 cmH20  No intake or output data in the 24 hours ending Jun 17, 2023 2125 Filed Weights   2023-06-17 1329  Weight: 52.2 kg    Examination: General: Adult female, critically ill, lying in bed intubated & sedated requiring mechanical ventilation, NAD HEENT: MM pink/moist, anicteric, atraumatic, neck supple Neuro: RASS -4, unable to follow commands, right eye blind/left pupil +3 and brisk CV: s1s2 RRR, ST on monitor, no r/m/g Pulm: Regular, non labored on PRVC 70% PEEP of 5, breath sounds coarse rhonchi-BUL & diminished-BLL GI: soft,  rounded, bs x 4 GU: foley in place with clear yellow urine Skin: Scattered ecchymosis Extremities: warm/dry, pulses + 2 R, left pedal pulse dopplerable and right popliteal dopplerable, no edema noted  Resolved Hospital Problem list     Assessment & Plan:  Acute Hypoxic Respiratory Failure suspect multifocal secondary to acute subsegmental PE and multifocal pneumonia in the setting of COPD  COPD with possible exacerbation PMHx: COPD, tobacco abuse - Ventilator settings: PRVC  8 mL/kg, 70% FiO2, 5 PEEP, continue ventilator support & lung protective strategies - Wean PEEP & FiO2 as tolerated, maintain SpO2 > 90% - Head of bed elevated 30 degrees, VAP protocol in place - Plateau pressures less than 30 cm H20  - Intermittent chest x-ray & ABG PRN - Daily WUA with SBT as tolerated  - Ensure adequate pulmonary hygiene  - F/u cultures, trend PCT - Continue CAP/Aspiration Pna coverage: Zosyn & vancomycin - Steroids initiated: solu-medrol 20 mg daily -Continue Singulair - Budesonide nebs BID, DuoNebs every 6, bronchodilators PRN - PAD protocol in place: continue Fentanyl drip & Propofol drip -Smoking cessation counseling once appropriate  Sepsis secondary to multifocal pneumonia Initial interventions/workup included: 3 L of NS/LR & Levaquin - f/u cultures, trend lactic/ PCT - Daily CBC, monitor WBC/ fever curve - IV antibiotics: zosyn & vancomycin  - Consider vasopressors to maintain MAP< 65: norepinephrine -Hold atenolol and benazepril due to mild hypotension post intubation  Small Subsegmental Pulmonary emboli Severe PAD Patient was scheduled for outpatient bilateral femoral endarterectomies with vascular surgery on 06/15/2023 which is now canceled. - Systemic Heparin drip per pharmacy protocol - Echocardiogram ordered  - PAD protocol in place  including fentanyl drip due to reports of severe pain from ischemic BLE - Vascular consulted, appreciate input  Elevated Troponin secondary  to N-STEMI vs demand ischemia in the setting of PE CAD Hyperlipidemia - Trend troponins  - Echocardiogram ordered, see above - heparin drip ordered - continuous cardiac monitoring - Consider Cardiology consult depending on work up above -Outpatient regimen on hold due to mild hypotension with sedatives & mild transaminitis, consider restarting as patient stabilizes  Acute Kidney Injury  Mild AGMA Baseline Cr: 0.8, Cr on admission:1.16 - Strict I/O's: alert provider if UOP < 0.5 mL/kg/hr - gentle IVF hydration  - Daily BMP, replace electrolytes PRN - Avoid nephrotoxic agents as able, ensure adequate renal perfusion - consider renal US  Mild transaminitis  - Trend hepatic function - Consider RUQ US/CT abdomen/pelvis - avoid hepatotoxic agents -Outpatient rosuvastatin on hold until LFTs normalize  Chronic pain -Outpatient regimen on hold while PAD protocol in place  Best Practice (right click and "Reselect all SmartList Selections" daily)  Diet/type: NPO w/ meds via tube DVT prophylaxis: systemic heparin GI prophylaxis: H2B Lines: N/A Foley:  Yes, and it is still needed Code Status:  full code Last date of multidisciplinary goals of care discussion [July 09, 2023]  Labs   CBC: Recent Labs  Lab 06/08/23 1324 06/12/23 1210 07-09-2023 1353 07/09/23 1547  WBC 8.9 21.2* 19.0* 14.2*  NEUTROABS 5.7  --  16.6* 12.2*  HGB 12.0 13.1 13.9 12.1  HCT 37.3 40.4 41.3 36.5  MCV 96 92.9 89.8 90.6  PLT 309 338 438* 347    Basic Metabolic Panel: Recent Labs  Lab 06/08/23 1324 06/12/23 1210 2023-07-09 1353  NA 142 136 137  K 3.7 3.4* 3.6  CL 103 99 99  CO2 24 23 20*  GLUCOSE 128* 167* 181*  BUN 13 18 36*  CREATININE 0.80 1.08* 1.16*  CALCIUM 9.1 9.0 8.8*   GFR: Estimated Creatinine Clearance: 30.8 mL/min (A) (by C-G formula based on SCr of 1.16 mg/dL (H)). Recent Labs  Lab 06/08/23 1324 06/12/23 1210 09-Jul-2023 1353 07-09-23 1547 Jul 09, 2023 1759  WBC 8.9 21.2* 19.0* 14.2*  --    LATICACIDVEN  --   --  3.6* 2.2* 1.7    Liver Function Tests: Recent Labs  Lab 06/08/23 1324 07-09-23 1353  AST 20 57*  ALT 13 25  ALKPHOS 69 83  BILITOT 0.4 1.2  PROT 6.2 8.8*  ALBUMIN 4.2 3.9   No results for input(s): "LIPASE", "AMYLASE" in the last 168 hours. No results for input(s): "AMMONIA" in the last 168 hours.  ABG    Component Value Date/Time   HCO3 19.9 (L) 07-09-23 2030   ACIDBASEDEF 8.4 (H) 07/09/2023 2030   O2SAT 51 Jul 09, 2023 2030     Coagulation Profile: Recent Labs  Lab 06/12/23 1210 July 09, 2023 1547  INR 1.3* 1.4*    Cardiac Enzymes: No results for input(s): "CKTOTAL", "CKMB", "CKMBINDEX", "TROPONINI" in the last 168 hours.  HbA1C: No results found for: "HGBA1C"  CBG: No results for input(s): "GLUCAP" in the last 168 hours.  Review of Systems:   UTA- patient intubated and unresponsive  Past Medical History:  She,  has a past medical history of Abdominal distension (03/18/2012), Abnormal weight loss (10/30/2017), Acute kidney injury (HCC) (03/22/2012), Aneurysm (HCC), Anxiety, Blind right eye, Cardiac arrest (HCC), Cataract, Claudication (HCC), Complication of anesthesia (03/15/2012), COPD (chronic obstructive pulmonary disease) (HCC), Delirium (03/18/2012), Dyspnea, GERD (gastroesophageal reflux disease), Heart murmur, Hypertension, Leaky heart valve, Oxygen deficit, Peripheral vascular disease (HCC), Scoliosis, Sepsis (HCC),  Shock (HCC) (03/23/2012), SIRS (systemic inflammatory response syndrome) (HCC) (03/23/2012), Spinal stenosis, Stroke (HCC), and Tachycardia.   Surgical History:   Past Surgical History:  Procedure Laterality Date   BACK SURGERY     BREAST LUMPECTOMY Left    CAROTID PTA/STENT INTERVENTION Right 09/19/2022   Procedure: CAROTID PTA/STENT INTERVENTION;  Surgeon: Annice Needy, MD;  Location: [facility] INVASIVE [facility];  Service: Cardiovascular;  Laterality: Right;   CATARACT EXTRACTION W/PHACO Left 08/14/2019   Procedure: CATARACT  EXTRACTION PHACO AND INTRAOCULAR LENS PLACEMENT (IOC) LEFT 10.22 00:59.7 42.4%;  Surgeon: Lockie Mola, MD;  Location: [facility];  Service: Ophthalmology;  Laterality: Left;  leave it last patient per [place]   LOWER EXTREMITY ANGIOGRAPHY Right 06/20/2018   Procedure: LOWER EXTREMITY ANGIOGRAPHY;  Surgeon: Annice Needy, MD;  Location: [facility] INV[facility]IVE CV [facility];  Service: Cardiovascular;  Laterality: Right;   LOWER EXTREMITY ANGIOGRAPHY Left 02/03/2020   Procedure: LOWER EXTREMITY ANGIOGRAPHY;  Surgeon: Annice Needy, MD;  Location: [facility] IN[facility] CV [facility];  Service: Cardiovascular;  Laterality: Left;   LOWER EXTREMITY ANGIOGRAPHY Right 02/20/2023   Procedure: Lower Extremity Angiography;  Surgeon: Annice Needy, MD;  Location: [facility] INVASIVE CV LAB;  Service: Cardiovascular;  Laterality: Right;   right eye surgery     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 She has a 20 pack-year smoking history. She has never used smokeless tobacco. She reports current alcohol use. She reports that she does not use drugs.   Family History:  Her family history includes Heart disease in her paternal grandfather and paternal grandmother; Heart failure in her father; Hypertension in her mother; Intracerebral hemorrhage in her mother; Kidney disease in her maternal grandmother; Rheum arthritis in her maternal grandmother; Schizophrenia in her daughter; Stroke in her paternal grandfather and paternal grandmother.   Allergies Allergies  Allergen Reactions   Plavix [Clopidogrel] Other (See Comments)    States "I hurt so bad I couldn't get out of bed."   Other Itching    Cat Gut sutures    Oxycodone Hives   Amlodipine Hives   Atorvastatin Other (See Comments)    weakness   Augmentin [Amoxicillin-Pot Clavulanate] Diarrhea   Azithromycin Hives   Contrast Media [Iodinated  Contrast Media] Hives   Doxycycline Diarrhea   Erythromycin Diarrhea   Hydrocodone Nausea Only   Lactose Intolerance (Gi) Diarrhea    Bloating   Minocycline Diarrhea   Nsaids Other (See Comments)    Internal Bleeding   Relafen [Nabumetone] Hives   Sulfa Antibiotics Hives   Synthroid [Levothyroxine] Diarrhea   Tetracyclines & Related Diarrhea   Tilactase    Tramadol Rash    Takes +/- Benadryl     Home Medications  Prior to Admission medications   Medication Sig Start Date End Date Taking? Authorizing Provider  albuterol (VENTOLIN HFA) 108 (90 Base) MCG/ACT inhaler Inhale 2 puffs into the lungs every 6 (six) hours as needed for wheezing or shortness of breath. 06/08/23   Johnson, Megan P, DO  aspirin EC 81 MG tablet Take 1 tablet (81 mg total) by mouth daily at 6 (six) AM. Swallow whole. 06/08/23   Johnson, Megan P, DO  atenolol (TENORMIN) 25 MG tablet Take 1 tablet (25 mg total) by mouth daily. 06/08/23   Johnson, Megan P, DO  B-COMPLEX-C PO Take 1  tablet by mouth daily.    [provider]  benazepril (LOTENSIN) 10 MG tablet Take 1 tablet (10 mg total) by mouth daily. 06/08/23   Olevia Perches P, DO  BRILINTA 90 MG TABS tablet TAKE 1 TABLET TWICE A DAY 05/17/23   Georgiana Spinner, NP  Collagen-Vitamin C-Biotin (COLLAGEN 1500/C) 500-50-0.8 MG CAPS Take by mouth.    [provider]  cyclobenzaprine (FLEXERIL) 5 MG tablet Take 1 tablet (5 mg total) by mouth 3 (three) times daily as needed for muscle spasms. 06/08/23   Johnson, Megan P, DO  diphenhydrAMINE (BENADRYL) 25 MG tablet Take 25 mg by mouth every 6 (six) hours as needed.     [provider]  fluticasone (FLONASE) 50 MCG/ACT nasal spray 2 sprays into each nostril in the morning. Up to three sprays. 11/18/20   [provider]  Fluticasone-Umeclidin-Vilant (TRELEGY ELLIPTA) 100-62.5-25 MCG/ACT AEPB Inhale 1 puff into the lungs daily for 1 day. 06/08/23   Johnson, Megan P, DO  gabapentin (NEURONTIN) 300  MG capsule Take 1 capsule (300 mg total) by mouth 2 (two) times daily. 06/08/23   Johnson, Megan P, DO  hydrOXYzine (ATARAX) 25 MG tablet Take 1 tablet (25 mg total) by mouth 3 (three) times daily as needed. 06/08/23   Johnson, Megan P, DO  lidocaine (LIDODERM) 5 % Place 1 patch onto the skin daily. Remove & Discard patch within 12 hours or as directed by MD 11/26/21   Edward Jolly, MD  Misc Natural Products (JOINT HEALTH PO) Take by mouth daily.    [provider]  montelukast (SINGULAIR) 10 MG tablet Take 1 tablet (10 mg total) by mouth at bedtime. 06/08/23   Olevia Perches P, DO  Multiple Vitamins-Minerals (MULTIVITAMIN WITH MINERALS) tablet Take 1 tablet by mouth daily.    [provider]  NIACINAMIDE PO Take 1 capsule by mouth daily.    [provider]  nitroGLYCERIN (NITROSTAT) 0.4 MG SL tablet Place 1 tablet (0.4 mg total) under the tongue every 5 (five) minutes as needed for chest pain. 05/02/23   Antonieta Iba, MD  omeprazole (PRILOSEC) 20 MG capsule Take 1 capsule (20 mg total) by mouth daily. 06/08/23   Johnson, Megan P, DO  ondansetron (ZOFRAN-ODT) 4 MG disintegrating tablet Take 1 tablet (4 mg total) by mouth every 8 (eight) hours as needed for nausea or vomiting. 10/27/22   Becky Augusta, NP  oxyCODONE (ROXICODONE) 5 MG immediate release tablet Take 1 tablet (5 mg total) by mouth every 8 (eight) hours as needed. 06/12/23 06/11/24  Chesley Noon, MD  rosuvastatin (CRESTOR) 5 MG tablet Take 1 tablet (5 mg total) by mouth daily. 06/08/23   Johnson, Megan P, DO  thyroid (ARMOUR) 130 MG tablet Take 1 tablet (130 mg total) by mouth every other day. 06/09/23   Dorcas Carrow, DO     Critical care time: 78 minutes       Betsey Holiday, AGACNP-BC Acute Care Nurse Practitioner [facility]   [phone] / [phone] Please see Amion for details.

## 2023-06-14 NOTE — ED Notes (Signed)
VO from dr. Rosalia Hammers to bolus 20 mg of propofol

## 2023-06-14 NOTE — Consult Note (Signed)
Pharmacy Antibiotic Note  Savannah Mcclure is a 82 y.o. female admitted on 06/14/2023 with acute respiratory failure.  Pharmacy has been consulted for vancomycin and Zosyn dosing for pneumonia.  Plan: Give vancomycin 1250 mg IV x 1, then start 500 mg IV every 24 hours Estimated AUC 443.8, Cmin 12.8 TBW, Scr 1.16, Vd 0.72 Vancomycin levels at steady state or as clinically indicated Zosyn 3.375g IV q8h (4 hour infusion). Follow renal function and cultures for adjustments  Height: 5\' 3"  (160 cm) Weight: 52.2 kg (115 lb) IBW/kg (Calculated) : 52.4  Temp (24hrs), Avg:98.5 F (36.9 C), Min:98.2 F (36.8 C), Max:98.8 F (37.1 C)  Recent Labs  Lab 06/08/23 1324 06/12/23 1210 06/14/23 1353 06/14/23 1547 06/14/23 1759  WBC 8.9 21.2* 19.0* 14.2*  --   CREATININE 0.80 1.08* 1.16*  --   --   LATICACIDVEN  --   --  3.6* 2.2* 1.7    Estimated Creatinine Clearance: 30.8 mL/min (A) (by C-G formula based on SCr of 1.16 mg/dL (H)).    Allergies  Allergen Reactions   Plavix [Clopidogrel] Other (See Comments)    States "I hurt so bad I couldn't get out of bed."   Other Itching    Cat Gut sutures    Oxycodone Hives   Amlodipine Hives   Atorvastatin Other (See Comments)    weakness   Augmentin [Amoxicillin-Pot Clavulanate] Diarrhea   Azithromycin Hives   Contrast Media [Iodinated Contrast Media] Hives   Doxycycline Diarrhea   Erythromycin Diarrhea   Hydrocodone Nausea Only   Lactose Intolerance (Gi) Diarrhea    Bloating   Minocycline Diarrhea   Nsaids Other (See Comments)    Internal Bleeding   Relafen [Nabumetone] Hives   Sulfa Antibiotics Hives   Synthroid [Levothyroxine] Diarrhea   Tetracyclines & Related Diarrhea   Tilactase    Tramadol Rash    Takes +/- Benadryl    Antimicrobials this admission: vanocmycin 10/30 >>  Zosyn 10/30 >>  Levofloxacin x 1 10/30  Dose adjustments this admission: N/A  Microbiology results: 10/30 BCx: pending 10/30 MRSA PCR:  ordered  Thank you for allowing pharmacy to be a part of this patient's care.  Barrie Folk 06/14/2023 9:53 PM

## 2023-06-14 NOTE — ED Notes (Signed)
Patient transported to CT 

## 2023-06-14 NOTE — ED Notes (Signed)
Per DR. Ray, give bolus from propofol bag at 20 mg, and titrate drip to 20 mg/kg/min

## 2023-06-14 NOTE — ED Notes (Signed)
Called CN for bed, she will call back.

## 2023-06-14 NOTE — ED Notes (Signed)
SPO2 now 90% on [facility]

## 2023-06-14 NOTE — ED Notes (Signed)
Per ICU NP, start patient at 36 mcg/hr to start fentanyl infusion

## 2023-06-15 ENCOUNTER — Inpatient Hospital Stay (HOSPITAL_COMMUNITY)
Admit: 2023-06-15 | Discharge: 2023-06-15 | Disposition: A | Discharge status: Home / Self Care | Payer: Medicare Other | Attending: Pulmonary Disease | Admitting: Pulmonary Disease

## 2023-06-15 ENCOUNTER — Inpatient Hospital Stay: Admission: RE | Admit: 2023-06-15 | Payer: Medicare Other | Source: Home / Self Care | Admitting: Vascular Surgery

## 2023-06-15 ENCOUNTER — Encounter: Admission: RE | Payer: Self-pay | Source: Home / Self Care

## 2023-06-15 DIAGNOSIS — J189 Pneumonia, unspecified organism: Secondary | ICD-10-CM

## 2023-06-15 DIAGNOSIS — I214 Non-ST elevation (NSTEMI) myocardial infarction: Secondary | ICD-10-CM

## 2023-06-15 DIAGNOSIS — I2694 Multiple subsegmental pulmonary emboli without acute cor pulmonale: Secondary | ICD-10-CM | POA: Diagnosis not present

## 2023-06-15 DIAGNOSIS — I2693 Single subsegmental pulmonary embolism without acute cor pulmonale: Secondary | ICD-10-CM

## 2023-06-15 DIAGNOSIS — R6521 Severe sepsis with septic shock: Secondary | ICD-10-CM

## 2023-06-15 DIAGNOSIS — N179 Acute kidney failure, unspecified: Secondary | ICD-10-CM | POA: Diagnosis not present

## 2023-06-15 DIAGNOSIS — J9601 Acute respiratory failure with hypoxia: Secondary | ICD-10-CM | POA: Diagnosis not present

## 2023-06-15 DIAGNOSIS — A419 Sepsis, unspecified organism: Secondary | ICD-10-CM

## 2023-06-15 LAB — STREP PNEUMONIAE URINARY ANTIGEN: Strep Pneumo Urinary Antigen: NEGATIVE

## 2023-06-15 LAB — PROCALCITONIN: Procalcitonin: 6.49 ng/mL

## 2023-06-15 LAB — BLOOD GAS, ARTERIAL
Acid-base deficit: 5.2 mmol/L — ABNORMAL HIGH (ref 0.0–2.0)
Bicarbonate: 23 mmol/L (ref 20.0–28.0)
FIO2: 60 %
MECHVT: 400 mL
Mechanical Rate: 15
O2 Saturation: 93.3 %
PEEP: 5 cmH2O
Patient temperature: 37
pCO2 arterial: 55 mm[Hg] — ABNORMAL HIGH (ref 32–48)
pH, Arterial: 7.23 — ABNORMAL LOW (ref 7.35–7.45)
pO2, Arterial: 65 mm[Hg] — ABNORMAL LOW (ref 83–108)

## 2023-06-15 LAB — CBC
HCT: 30.1 % — ABNORMAL LOW (ref 36.0–46.0)
Hemoglobin: 9.9 g/dL — ABNORMAL LOW (ref 12.0–15.0)
MCH: 30.2 pg (ref 26.0–34.0)
MCHC: 32.9 g/dL (ref 30.0–36.0)
MCV: 91.8 fL (ref 80.0–100.0)
Platelets: 425 10*3/uL — ABNORMAL HIGH (ref 150–400)
RBC: 3.28 MIL/uL — ABNORMAL LOW (ref 3.87–5.11)
RDW: 14.7 % (ref 11.5–15.5)
WBC: 31.4 10*3/uL — ABNORMAL HIGH (ref 4.0–10.5)
nRBC: 0 % (ref 0.0–0.2)

## 2023-06-15 LAB — HEMOGLOBIN AND HEMATOCRIT, BLOOD
HCT: 33 % — ABNORMAL LOW (ref 36.0–46.0)
Hemoglobin: 10.9 g/dL — ABNORMAL LOW (ref 12.0–15.0)

## 2023-06-15 LAB — HEPARIN LEVEL (UNFRACTIONATED)
Heparin Unfractionated: 0.48 [IU]/mL (ref 0.30–0.70)
Heparin Unfractionated: 0.29 [IU]/mL — ABNORMAL LOW (ref 0.30–0.70)

## 2023-06-15 LAB — T4, FREE: Free T4: 0.68 ng/dL (ref 0.61–1.12)

## 2023-06-15 LAB — GLUCOSE, CAPILLARY
Glucose-Capillary: 111 mg/dL — ABNORMAL HIGH (ref 70–99)
Glucose-Capillary: 149 mg/dL — ABNORMAL HIGH (ref 70–99)
Glucose-Capillary: 151 mg/dL — ABNORMAL HIGH (ref 70–99)
Glucose-Capillary: 175 mg/dL — ABNORMAL HIGH (ref 70–99)
Glucose-Capillary: 183 mg/dL — ABNORMAL HIGH (ref 70–99)
Glucose-Capillary: 183 mg/dL — ABNORMAL HIGH (ref 70–99)
Glucose-Capillary: 192 mg/dL — ABNORMAL HIGH (ref 70–99)
Glucose-Capillary: 195 mg/dL — ABNORMAL HIGH (ref 70–99)
Glucose-Capillary: 198 mg/dL — ABNORMAL HIGH (ref 70–99)

## 2023-06-15 LAB — COMPREHENSIVE METABOLIC PANEL
ALT: 17 U/L (ref 0–44)
AST: 38 U/L (ref 15–41)
Albumin: 2.7 g/dL — ABNORMAL LOW (ref 3.5–5.0)
Alkaline Phosphatase: 64 U/L (ref 38–126)
Anion gap: 12 (ref 5–15)
BUN: 37 mg/dL — ABNORMAL HIGH (ref 8–23)
CO2: 22 mmol/L (ref 22–32)
Calcium: 7.9 mg/dL — ABNORMAL LOW (ref 8.9–10.3)
Chloride: 104 mmol/L (ref 98–111)
Creatinine, Ser: 1.12 mg/dL — ABNORMAL HIGH (ref 0.44–1.00)
GFR, Estimated: 49 mL/min — ABNORMAL LOW (ref >60)
Glucose, Bld: 225 mg/dL — ABNORMAL HIGH (ref 70–99)
Potassium: 3.2 mmol/L — ABNORMAL LOW (ref 3.5–5.1)
Sodium: 138 mmol/L (ref 135–145)
Total Bilirubin: 1.2 mg/dL (ref 0.3–1.2)
Total Protein: 6.2 g/dL — ABNORMAL LOW (ref 6.5–8.1)

## 2023-06-15 LAB — MAGNESIUM: Magnesium: 2.4 mg/dL (ref 1.7–2.4)

## 2023-06-15 LAB — TRIGLYCERIDES: Triglycerides: 210 mg/dL — ABNORMAL HIGH (ref <150)

## 2023-06-15 LAB — TROPONIN I (HIGH SENSITIVITY)
Troponin I (High Sensitivity): 352 ng/L (ref <18)
Troponin I (High Sensitivity): 365 ng/L (ref <18)
Troponin I (High Sensitivity): 414 ng/L (ref <18)
Troponin I (High Sensitivity): 199 ng/L (ref <18)

## 2023-06-15 LAB — ECHOCARDIOGRAM COMPLETE
AR max vel: 1.01 cm2
AV Area VTI: 0.94 cm2
AV Area mean vel: 0.93 cm2
AV Mean grad: 3.3 mm[Hg]
AV Peak grad: 6.1 mm[Hg]
Ao pk vel: 1.24 m/s
Area-P 1/2: 5.27 cm2
Height: 63 in
MV VTI: 1.25 cm2
S' Lateral: 2.1 cm
Weight: 1840 [oz_av]

## 2023-06-15 LAB — POTASSIUM: Potassium: 3 mmol/L — ABNORMAL LOW (ref 3.5–5.1)

## 2023-06-15 LAB — HEMOGLOBIN A1C
Hgb A1c MFr Bld: 5.9 % — ABNORMAL HIGH (ref 4.8–5.6)
Mean Plasma Glucose: 122.63 mg/dL

## 2023-06-15 LAB — MRSA NEXT GEN BY PCR, NASAL: MRSA by PCR Next Gen: NOT DETECTED

## 2023-06-15 LAB — PHOSPHORUS: Phosphorus: 4.2 mg/dL (ref 2.5–4.6)

## 2023-06-15 LAB — LACTIC ACID, PLASMA: Lactic Acid, Venous: 1.6 mmol/L (ref 0.5–1.9)

## 2023-06-15 SURGERY — ENDARTERECTOMY, FEMORAL
Anesthesia: General

## 2023-06-15 MED ORDER — MONTELUKAST SODIUM 10 MG PO TABS
10.0000 mg | ORAL_TABLET | Freq: Every day | ORAL | Status: DC
  Administered 2023-06-15 – 2023-06-16 (×2): 10 mg
  Filled 2023-06-15 (×2): qty 1

## 2023-06-15 MED ORDER — CHLORHEXIDINE GLUCONATE CLOTH 2 % EX PADS
6.0000 | MEDICATED_PAD | Freq: Every day | CUTANEOUS | Status: DC
  Administered 2023-06-15 – 2023-06-17 (×3): 6 via TOPICAL

## 2023-06-15 MED ORDER — FREE WATER
30.0000 mL | Status: DC
  Administered 2023-06-15 – 2023-06-17 (×15): 30 mL

## 2023-06-15 MED ORDER — LACTATED RINGERS IV BOLUS
500.0000 mL | Freq: Once | INTRAVENOUS | Status: AC
  Administered 2023-06-15: 500 mL via INTRAVENOUS

## 2023-06-15 MED ORDER — HYDROCORTISONE SOD SUC (PF) 100 MG IJ SOLR
50.0000 mg | Freq: Four times a day (QID) | INTRAMUSCULAR | Status: DC
  Administered 2023-06-15 – 2023-06-17 (×10): 50 mg via INTRAVENOUS
  Filled 2023-06-15 (×4): qty 2
  Filled 2023-06-15 (×2): qty 1
  Filled 2023-06-15 (×4): qty 2

## 2023-06-15 MED ORDER — ORAL CARE MOUTH RINSE
15.0000 mL | OROMUCOSAL | Status: DC
  Administered 2023-06-15 – 2023-06-17 (×29): 15 mL via OROMUCOSAL

## 2023-06-15 MED ORDER — HYDROCORTISONE SOD SUC (PF) 100 MG IJ SOLR
100.0000 mg | Freq: Two times a day (BID) | INTRAMUSCULAR | Status: DC
  Administered 2023-06-15: 100 mg via INTRAVENOUS
  Filled 2023-06-15: qty 2

## 2023-06-15 MED ORDER — HYDROCORTISONE SOD SUC (PF) 100 MG IJ SOLR: 100.0000 mg | Freq: Four times a day (QID) | INTRAMUSCULAR | Status: DC

## 2023-06-15 MED ORDER — INSULIN ASPART 100 UNIT/ML IJ SOLN
0.0000 [IU] | INTRAMUSCULAR | Status: DC
Start: 2023-06-15 — End: 2023-06-17
  Administered 2023-06-15: 2 [IU] via SUBCUTANEOUS
  Administered 2023-06-15 – 2023-06-16 (×3): 3 [IU] via SUBCUTANEOUS
  Administered 2023-06-16 (×2): 2 [IU] via SUBCUTANEOUS
  Administered 2023-06-16: 3 [IU] via SUBCUTANEOUS
  Administered 2023-06-16: 5 [IU] via SUBCUTANEOUS
  Administered 2023-06-16: 3 [IU] via SUBCUTANEOUS
  Administered 2023-06-16: 2 [IU] via SUBCUTANEOUS
  Administered 2023-06-17: 3 [IU] via SUBCUTANEOUS
  Administered 2023-06-17 (×2): 5 [IU] via SUBCUTANEOUS
  Filled 2023-06-15 (×13): qty 1

## 2023-06-15 MED ORDER — BUDESONIDE 0.25 MG/2ML IN SUSP
0.2500 mg | Freq: Two times a day (BID) | RESPIRATORY_TRACT | Status: DC
  Administered 2023-06-15 (×2): 0.25 mg via RESPIRATORY_TRACT
  Filled 2023-06-15 (×2): qty 2

## 2023-06-15 MED ORDER — PANTOPRAZOLE SODIUM 40 MG IV SOLR
40.0000 mg | Freq: Every day | INTRAVENOUS | Status: DC
  Administered 2023-06-15 – 2023-06-17 (×3): 40 mg via INTRAVENOUS
  Filled 2023-06-15 (×3): qty 10

## 2023-06-15 MED ORDER — METHYLPREDNISOLONE SODIUM SUCC 40 MG IJ SOLR: 20.0000 mg | Freq: Every day | INTRAMUSCULAR | Status: DC

## 2023-06-15 MED ORDER — HYDROCORTISONE SOD SUC (PF) 100 MG IJ SOLR: 50.0000 mg | Freq: Four times a day (QID) | INTRAMUSCULAR | Status: DC

## 2023-06-15 MED ORDER — ORAL CARE MOUTH RINSE: 15.0000 mL | OROMUCOSAL | Status: DC | PRN

## 2023-06-15 MED ORDER — POTASSIUM CHLORIDE 20 MEQ PO PACK
40.0000 meq | PACK | Freq: Once | ORAL | Status: AC
  Administered 2023-06-15: 40 meq
  Filled 2023-06-15: qty 2

## 2023-06-15 MED ORDER — VITAL AF 1.2 CAL PO LIQD
1000.0000 mL | ORAL | Status: DC
  Administered 2023-06-15 – 2023-06-17 (×3): 1000 mL

## 2023-06-15 MED ORDER — HEPARIN BOLUS VIA INFUSION
800.0000 [IU] | Freq: Once | INTRAVENOUS | Status: AC
  Administered 2023-06-15: 800 [IU] via INTRAVENOUS
  Filled 2023-06-15: qty 800

## 2023-06-15 MED ORDER — IPRATROPIUM-ALBUTEROL 0.5-2.5 (3) MG/3ML IN SOLN
3.0000 mL | Freq: Four times a day (QID) | RESPIRATORY_TRACT | Status: DC
  Administered 2023-06-15 – 2023-06-17 (×12): 3 mL via RESPIRATORY_TRACT
  Filled 2023-06-15 (×12): qty 3

## 2023-06-15 MED ORDER — POTASSIUM CHLORIDE 10 MEQ/100ML IV SOLN
10.0000 meq | INTRAVENOUS | Status: AC
  Administered 2023-06-15 (×2): 10 meq via INTRAVENOUS
  Filled 2023-06-15 (×2): qty 100

## 2023-06-15 MED ORDER — DEXTROSE 5 % IV SOLN
100.0000 mg | Freq: Two times a day (BID) | INTRAVENOUS | Status: DC
  Administered 2023-06-15 – 2023-06-17 (×6): 100 mg via INTRAVENOUS
  Filled 2023-06-15 (×7): qty 100

## 2023-06-15 MED ORDER — ASPIRIN 81 MG PO TBEC
81.0000 mg | DELAYED_RELEASE_TABLET | Freq: Every day | ORAL | Status: DC
  Administered 2023-06-16: 81 mg via ORAL
  Filled 2023-06-15 (×2): qty 1

## 2023-06-15 MED ORDER — MAGNESIUM SULFATE 2 GM/50ML IV SOLN
2.0000 g | Freq: Once | INTRAVENOUS | Status: AC
  Administered 2023-06-15: 2 g via INTRAVENOUS
  Filled 2023-06-15: qty 50

## 2023-06-15 NOTE — Progress Notes (Signed)
NAME:  [place] [name], MRN:  [id number], DOB:  February 23, 1941, LOS: 1 ADMISSION DATE:  06-20-23, CHIEF COMPLAINT:  Respiratory Failure   History of Present Illness:   82 year old female presenting to [facility] ED from home on 2023-06-20 for evaluation of bilateral lower extremity lower back pain as well as shortness of breath.   History provided per chart review and daughter's telephone interview, patient unable to participate in interview at this time.  On arrival to EDP patient reporting significant lower back and bilateral lower extremity pain as well as shortness of breath.  Patient confirmed productive cough with EDP on arrival.   Daughter confirms the patient has been experiencing chest pain, fever and chills as well as a new cough. Patient was scheduled for a bilateral femoral endarterectomies on 06/15/2023 after being followed outpatient by vascular surgery for severe PAD.  Daughter reports the patient's pain has not been well-controlled at home.  The patient has been unable to walk, screaming out at times with correlating shortness of breath and " blue lips".  The patient has been telling the daughter she feels as though she is 'on her way out'. The daughter found her current state extremely upsetting stating she "did not want to watch her die".  Daughter had called EMS 3 times, between 10/28 and 06-20-2023, but the patient refused to be taken to the ED. daughter confirms the patient smokes 2 packs of cigarettes daily and has been for the past 5 years prior to that she reports a 50+ pack year history.  Daughter also confirms intermittent marijuana use and some EtOH use but not daily. Of note patient was seen in the ED on 06/12/2023 and prescribed short course of oxycodone.   ED course: Upon arrival patient alert and responsive- tachycardic, tachypneic and hypoxic at 86% on room air.  Lab work concerning for sepsis with lactic acidosis and leukocytosis, mild AKI and NAGMA as well as mild  transaminitis.  Initial chest x-ray concerning for pneumonia.  Sepsis protocol initiated with empiric antibiotics and IV fluid resuscitation.  CT angio ordered the patient has contrast allergy requiring premedication.  After CT angio patient's hypoxia, tachycardia and work of breathing worsened significantly ultimately requiring urgent intubation and mechanical ventilatory support.  CT angio positive for PE without right heart strain as well as significant bilateral groundglass opacities.  Medications given: Benadryl, heparin bolus and drip initiation, Solu-Medrol, Versed/etomidate/succinylcholine, Levaquin, 3 L IV fluid bolus, propofol and fentanyl drip started, IV contrast  Pertinent  Medical History  Blind right eye COPD Hypertension PAD CVA Hyperlipidemia Anxiety & depression CAD Hypothyroidism Thoracic spondylosis without myelopathy  Significant Hospital Events: Including procedures, antibiotic start and stop dates in addition to other pertinent events   06/20/23: Admit patient to ICU with acute hypoxic respiratory failure secondary to acute PE and multifocal pneumonia in the setting of COPD and severe PAD.  Interim History / Subjective:  Patient intubated and sedated  Objective   Blood pressure (!) 98/50, pulse (!) 109, temperature 98.1 F (36.7 C), resp. rate 13, height 5\' 3"  (1.6 m), weight 52.2 kg, SpO2 92%.    Vent Mode: PRVC FiO2 (%):  [50 %-100 %] 50 % Set Rate:  [15 bmp] 15 bmp Vt Set:  [400 mL] 400 mL PEEP:  [5 cmH20] 5 cmH20 Plateau Pressure:  [12 cmH20-21 cmH20] 20 cmH20   Intake/Output Summary (Last 24 hours) at 06/15/2023 1724 Last data filed at 06/15/2023 1600 Gross per 24 hour  Intake 1268.51 ml  Output 275 ml  Net 993.51 ml   Filed Weights   June 20, 2023 1329  Weight: 52.2 kg    Examination: Physical Exam Constitutional:      General: She is not in acute distress.    Appearance: She is ill-appearing.  Cardiovascular:     Rate and Rhythm: Normal  rate and regular rhythm.     Pulses: Normal pulses.     Heart sounds: Normal heart sounds.  Pulmonary:     Effort: Pulmonary effort is normal.     Breath sounds: Normal breath sounds.     Comments: Ventilated breath sounds Abdominal:     Palpations: Abdomen is soft.  Neurological:     General: No focal deficit present.     Mental Status: She is alert and oriented to person, place, and time. Mental status is at baseline.     Assessment & Plan:   Neurology #Toxic Metabolic Encephalopathy #Advanced Dementia #Carotid artery stenosis  Presenting with worsening altered mental status secondary to infection (pneumonia). Intubated for respiratory failure and initiated on Propofol with PRN fentanyl for analgosedation.  -Maintain a RASS goal of -1 -Propofol and Fentanyl to maintain RASS goal -Avoid sedating medications as able -Daily wake up assessment -Hold Gabapentin and diphenhydramine  Cardiovascular #NSTEMI #Sepsis #CAD #PAD  Presenting with circulatory shock secondary to sepsis from pneumonia with component of vasodilatory effect of propofol. Maintained on low dose nor-epinephrine, with goal MAP > 65 mmHg. She has a history of CAD, followed by Dr. Mariah Milling outpatient, and recently underwent a pharmacological myocardial perfusion scan showing low risk for MI. She also has a history of PAD noted on angiography with vascular surgery, with history of stenting and most recently found to have near occlusive stenosis of the right CFA, 85% stenosis in the right profunda femoris, and 60% disease in the left CFA. Will consult cardiology to assess if she needs to be on three agents, or if we can discontinue aspirin or ticagrelol. Continue aspirin for now, holding ticagrelol.  -continue heparin gtt -nor-epinephrine, goal MAP > 65 -lactic acid WNL -continue to trend troponin -TTE today -Will consult Cardiology tomorrow  Pulmonary #Acute Hypoxic Respiratory Failure #Subsegmental  PE #Community Acquired Pneumonia #COPD  Was intubated for hypoxic respiratory failure, with imaging showing bilateral infiltrates with a RLL consolidation, consistent with infection. She's also found to have subsegmental PE for which heparin was initiated. On antibiotics and steroids (see below) and vent settings are improved. Continue nebulizers for COPD.  -Full vent support, implement lung protective strategies -Plateau pressures less than 30 cm H20 -Wean FiO2 & PEEP as tolerated to maintain O2 sats >92% -Follow intermittent Chest X-ray & ABG as needed -Spontaneous Breathing Trials when respiratory parameters met and mental status permits -Duonebs standing every 6 hours -Implement VAP Bundle  Gastrointestinal #Transaminitis  Mild elevation in transaminases has resolved, holding hepatotoxins, but this is likely secondary to sepsis and hypotension. Initiating tube feeds and PPI for SUP.  -hold rosuvastatin  Renal #Acute Kidney Injury #Metabolic Acidosis  In the setting of sepsis and hypotension. Has also received contrast and we will hold off on any diuresis at this time. Avoiding nephrotoxins as able. Patient with mild metabolic acidosis (lactic acid and aki), will repeat blood gas now that lactic acid is resolved. Holding outpatient ACEi.  -US kidneys -monitor I/O's -avoid nephrotoxins -check blood gas  Endocrine #Hypothyroidism  Significant hypothyroidism, with TSH of 47 a week ago. Will check free T3 and T4 prior to initiating treatment. On ICU glycemic protocol  Hem/Onc  On heparin gtt for management of subsegmental PE  ID #Community Acquired Pneumonia #Severe Sepsis  Found to have a RLL infiltrate and consolidation on CT chest. Will initiate broad spectrum antibiotics, including atypical coverage (with Doxycycline). MRSA screen, respiratory cultures, and legionella/strep urinary antigen ordered.  -continue Doxycycline/Zosyn  Best Practice (right click and  "Reselect all SmartList Selections" daily)   Diet/type: tubefeeds DVT prophylaxis: systemic heparin GI prophylaxis: PPI Lines: N/A Foley:  Yes, and it is still needed Code Status:  full code Last date of multidisciplinary goals of care discussion [06/15/2023]  Labs   CBC: Recent Labs  Lab 06/12/23 1210 06/26/23 1353 06/26/2023 1547 06/15/23 0516  WBC 21.2* 19.0* 14.2* 31.4*  NEUTROABS  --  16.6* 12.2*  --   HGB 13.1 13.9 12.1 9.9*  HCT 40.4 41.3 36.5 30.1*  MCV 92.9 89.8 90.6 91.8  PLT 338 438* 347 425*    Basic Metabolic Panel: Recent Labs  Lab 06/12/23 1210 26-Jun-2023 1353 2023/06/26 2158 06/15/23 0516 06/15/23 1444  NA 136 137  --  138  --   K 3.4* 3.6  --  3.2* 3.0*  CL 99 99  --  104  --   CO2 23 20*  --  22  --   GLUCOSE 167* 181*  --  225*  --   BUN 18 36*  --  37*  --   CREATININE 1.08* 1.16*  --  1.12*  --   CALCIUM 9.0 8.8*  --  7.9*  --   MG  --   --  1.4* 2.4  --   PHOS  --   --  3.5 4.2  --    GFR: Estimated Creatinine Clearance: 31.9 mL/min (A) (by C-G formula based on SCr of 1.12 mg/dL (H)). Recent Labs  Lab 06/12/23 1210 06/26/23 1353 26-Jun-2023 1353 Jun 26, 2023 1547 06-26-2023 1759 Jun 26, 2023 2158 06/15/23 0030 06/15/23 0516  PROCALCITON  --   --   --   --   --  3.76  --  6.49  WBC 21.2* 19.0*  --  14.2*  --   --   --  31.4*  LATICACIDVEN  --  3.6*   < > 2.2* 1.7 2.9* 1.6  --    < > = values in this interval not displayed.    Liver Function Tests: Recent Labs  Lab 06/26/23 1353 06/15/23 0516  AST 57* 38  ALT 25 17  ALKPHOS 83 64  BILITOT 1.2 1.2  PROT 8.8* 6.2*  ALBUMIN 3.9 2.7*   No results for input(s): "LIPASE", "AMYLASE" in the last 168 hours. No results for input(s): "AMMONIA" in the last 168 hours.  ABG    Component Value Date/Time   PHART 7.26 (L) Jun 26, 2023 2139   PCO2ART 39 06/26/23 2139   PO2ART 91 06-26-2023 2139   HCO3 17.5 (L) 06-26-23 2139   ACIDBASEDEF 9.0 (H) Jun 26, 2023 2139   O2SAT 98.1 06-26-2023 2139      Coagulation Profile: Recent Labs  Lab 06/12/23 1210 06-26-2023 1547  INR 1.3* 1.4*    Cardiac Enzymes: No results for input(s): "CKTOTAL", "CKMB", "CKMBINDEX", "TROPONINI" in the last 168 hours.  HbA1C: Hgb A1c MFr Bld  Date/Time Value Ref Range Status  06/15/2023 07:44 AM 5.9 (H) 4.8 - 5.6 % Final    Comment:    (NOTE) Pre diabetes:          5.7%-6.4%  Diabetes:              >6.4%  Glycemic control for   <  7.0% adults with diabetes     CBG: Recent Labs  Lab 06/15/23 0501 06/15/23 0634 06/15/23 0743 06/15/23 1129 06/15/23 1548  GLUCAP 192* 198* 195* 151* 149*    Review of Systems:   Unable to obtain  Past Medical History:  She,  has a past medical history of Abdominal distension (03/18/2012), Abnormal weight loss (10/30/2017), Acute kidney injury (HCC) (03/22/2012), Aneurysm (HCC), Anxiety, Blind right eye, Cardiac arrest (HCC), Cataract, Claudication (HCC), Complication of anesthesia (03/15/2012), COPD (chronic obstructive pulmonary disease) (HCC), Delirium (03/18/2012), Dyspnea, GERD (gastroesophageal reflux disease), Heart murmur, Hypertension, Leaky heart valve, Oxygen deficit, Peripheral vascular disease (HCC), Scoliosis, Sepsis (HCC), Shock (HCC) (03/23/2012), SIRS (systemic inflammatory response syndrome) (HCC) (03/23/2012), Spinal stenosis, Stroke (HCC), and Tachycardia.   Surgical History:   Past Surgical History:  Procedure Laterality Date   BACK SURGERY     BREAST LUMPECTOMY Left    CAROTID PTA/STENT INTERVENTION Right 09/19/2022   Procedure: CAROTID PTA/STENT INTERVENTION;  Surgeon: Annice Needy, MD;  Location: [facility];  Service: Cardiovascular;  Laterality: Right;   CATARACT EXTRACTION W/PHACO Left 08/14/2019   Procedure: CATARACT EXTRACTION PHACO AND INTRAOCULAR LENS PLACEMENT (IOC) LEFT 10.22 00:59.7 42.4%;  Surgeon: Lockie Mola, MD;  Location: [facility];  Service: Ophthalmology;  Laterality: Left;  leave it last patient per  [place]   LOWER EXTREMITY ANGIOGRAPHY Right 06/20/2018   Procedure: LOWER EXTREMITY ANGIOGRAPHY;  Surgeon: Annice Needy, MD;  Location: [facility];  Service: Cardiovascular;  Laterality: Right;   LOWER EXTREMITY ANGIOGRAPHY Left 02/03/2020   Procedure: LOWER EXTREMITY ANGIOGRAPHY;  Surgeon: Annice Needy, MD;  Location: [facility];  Service: Cardiovascular;  Laterality: Left;   LOWER EXTREMITY ANGIOGRAPHY Right 02/20/2023   Procedure: Lower Extremity Angiography;  Surgeon: Annice Needy, MD;  Location: [facility] INV[facility];  Service: Cardiovascular;  Laterality: Right;   right eye surgery     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 She has a 20 pack-year smoking history. She has never used smokeless tobacco. She reports current alcohol use. She reports that she does not use drugs.   Family History:  Her family history includes Heart disease in her paternal grandfather and paternal grandmother; Heart failure in her father; Hypertension in her mother; Intracerebral hemorrhage in her mother; Kidney disease in her maternal grandmother; Rheum arthritis in her maternal grandmother; Schizophrenia in her daughter; Stroke in her paternal grandfather and paternal grandmother.   Allergies Allergies  Allergen Reactions   Plavix [Clopidogrel] Other (See Comments)    States "I hurt so bad I couldn't get out of bed."   Other Itching    Cat Gut sutures    Oxycodone Hives   Amlodipine Hives   Atorvastatin Other (See Comments)    weakness   Augmentin [Amoxicillin-Pot Clavulanate] Diarrhea   Azithromycin Hives   Contrast Media [Iodinated Contrast Media] Hives   Doxycycline Diarrhea   Erythromycin Diarrhea   Hydrocodone Nausea Only   Lactose Intolerance (Gi) Diarrhea    Bloating   Minocycline Diarrhea   Nsaids Other (See Comments)    Internal Bleeding    Relafen [Nabumetone] Hives   Sulfa Antibiotics Hives   Synthroid [Levothyroxine] Diarrhea   Tetracyclines & Related Diarrhea   Tilactase    Tramadol Rash    Takes +/- Benadryl  Home Medications  Prior to Admission medications   Medication Sig Start Date End Date Taking? Authorizing Provider  albuterol (VENTOLIN HFA) 108 (90 Base) MCG/ACT inhaler Inhale 2 puffs into the lungs every 6 (six) hours as needed for wheezing or shortness of breath. 06/08/23   Johnson, Megan P, DO  aspirin EC 81 MG tablet Take 1 tablet (81 mg total) by mouth daily at 6 (six) AM. Swallow whole. 06/08/23   Johnson, Megan P, DO  atenolol (TENORMIN) 25 MG tablet Take 1 tablet (25 mg total) by mouth daily. 06/08/23   Johnson, Megan P, DO  B-COMPLEX-C PO Take 1 tablet by mouth daily.    [provider]  benazepril (LOTENSIN) 10 MG tablet Take 1 tablet (10 mg total) by mouth daily. 06/08/23   Olevia Perches P, DO  BRILINTA 90 MG TABS tablet TAKE 1 TABLET TWICE A DAY 05/17/23   Georgiana Spinner, NP  Collagen-Vitamin C-Biotin (COLLAGEN 1500/C) 500-50-0.8 MG CAPS Take by mouth.    [provider]  cyclobenzaprine (FLEXERIL) 5 MG tablet Take 1 tablet (5 mg total) by mouth 3 (three) times daily as needed for muscle spasms. 06/08/23   Johnson, Megan P, DO  diphenhydrAMINE (BENADRYL) 25 MG tablet Take 25 mg by mouth every 6 (six) hours as needed.     [provider]  fluticasone (FLONASE) 50 MCG/ACT nasal spray 2 sprays into each nostril in the morning. Up to three sprays. 11/18/20   [provider]  Fluticasone-Umeclidin-Vilant (TRELEGY ELLIPTA) 100-62.5-25 MCG/ACT AEPB Inhale 1 puff into the lungs daily for 1 day. 06/08/23   Johnson, Megan P, DO  gabapentin (NEURONTIN) 300 MG capsule Take 1 capsule (300 mg total) by mouth 2 (two) times daily. 06/08/23   Johnson, Megan P, DO  hydrOXYzine (ATARAX) 25 MG tablet Take 1 tablet (25 mg total) by mouth 3 (three) times daily as needed. 06/08/23    Johnson, Megan P, DO  lidocaine (LIDODERM) 5 % Place 1 patch onto the skin daily. Remove & Discard patch within 12 hours or as directed by MD 11/26/21   Edward Jolly, MD  Misc Natural Products (JOINT HEALTH PO) Take by mouth daily.    [provider]  montelukast (SINGULAIR) 10 MG tablet Take 1 tablet (10 mg total) by mouth at bedtime. 06/08/23   Olevia Perches P, DO  Multiple Vitamins-Minerals (MULTIVITAMIN WITH MINERALS) tablet Take 1 tablet by mouth daily.    [provider]  NIACINAMIDE PO Take 1 capsule by mouth daily.    [provider]  nitroGLYCERIN (NITROSTAT) 0.4 MG SL tablet Place 1 tablet (0.4 mg total) under the tongue every 5 (five) minutes as needed for chest pain. 05/02/23   Antonieta Iba, MD  omeprazole (PRILOSEC) 20 MG capsule Take 1 capsule (20 mg total) by mouth daily. 06/08/23   Johnson, Megan P, DO  ondansetron (ZOFRAN-ODT) 4 MG disintegrating tablet Take 1 tablet (4 mg total) by mouth every 8 (eight) hours as needed for nausea or vomiting. 10/27/22   Becky Augusta, NP  oxyCODONE (ROXICODONE) 5 MG immediate release tablet Take 1 tablet (5 mg total) by mouth every 8 (eight) hours as needed. 06/12/23 06/11/24  Chesley Noon, MD  rosuvastatin (CRESTOR) 5 MG tablet Take 1 tablet (5 mg total) by mouth daily. 06/08/23   Johnson, Megan P, DO  thyroid (ARMOUR) 130 MG tablet Take 1 tablet (130 mg total) by mouth every other day. 06/09/23   Dorcas Carrow, DO     Critical care  time: 48 minutes    Raechel Chute, MD [facility] 06/15/2023 6:02 PM

## 2023-06-15 NOTE — Progress Notes (Signed)
Transported patient from ER to ICU with no incidents.

## 2023-06-15 NOTE — TOC Initial Note (Signed)
Transition of Care [facility]) - Initial/Assessment Note    Patient Details  Name: Savannah Mcclure MRN: [id number] Date of Birth: May 19, 1941  [facility] CM/SW Contact:    Chapman Fitch, RN Phone Number: 06/15/2023, 12:48 PM  Clinical Narrative:                  Patient currently intubated.  Called daughter [name] to complete assessment  Patient was planned to have scheduled bilateral femoral endarterectomies on 06/15/2023, however presented to ED with respiratory failure    Admitted for: respiratory failure Admitted from: Home with daughter  PCP: Laural Benes.  Daughter states at baseline patient drives, but that daughter is also available  Current home health/prior home health/DME: [employer] portable concentrator that she uses at night, RW  TOC following for progression and disposition       Patient Goals and CMS Choice            Expected Discharge Plan and Services       Living arrangements for the past 2 months: Single Family Home                                      Prior Living Arrangements/Services Living arrangements for the past 2 months: Single Family Home Lives with:: Adult Children                   Activities of Daily Living      Permission Sought/Granted                  Emotional Assessment              Admission diagnosis:  Acute respiratory failure with hypoxia (HCC) [J96.01] Acute hypoxic respiratory failure (HCC) [J96.01] Patient Active Problem List   Diagnosis Date Noted   Acute hypoxic respiratory failure (HCC) 06/08/2023   Carotid stenosis, right 09/19/2022   Chronic respiratory failure with hypoxia (HCC) 07/20/2021   Thrombocytosis    Multifocal pneumonia 07/11/2021   Hypomagnesemia    Senile purpura (HCC) 05/27/2021   Coronary artery disease of native artery of native heart with stable angina pectoris (HCC) 05/27/2021   Aortic atherosclerosis (HCC) 11/03/2020   Hypothyroidism  09/13/2020   Thoracic or lumbosacral neuritis or radiculitis, unspecified 02/25/2020   Spondylosis without myelopathy or radiculopathy, lumbar region 06/19/2018   Thoracic spondylosis without myelopathy 06/19/2018   Atherosclerosis of native arteries of extremity with intermittent claudication (HCC) 04/13/2018   Carotid stenosis 04/13/2018   Lumbar degenerative disc disease 12/19/2017   Chronic, continuous use of opioids 12/19/2017   Chronic left shoulder pain 12/19/2017   Chronic pain syndrome 12/19/2017   Idiopathic scoliosis and kyphoscoliosis 10/30/2017   PAD (peripheral artery disease) (HCC) 10/30/2017   Vision loss 10/30/2017   Sinus tachycardia 10/30/2017   Anxiety 10/30/2017   Anemia 10/30/2017   Hypokalemia 10/30/2017   Essential hypertension 10/07/2017   COPD (chronic obstructive pulmonary disease) (HCC) 10/07/2017   Feeding problem in adult 07/25/2012   Gastrostomy in place [facility]) 07/25/2012   Hypoxemia 03/18/2012   Oliguria 03/18/2012   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   Degenerative joint disease 02/28/2012   Scoliosis 02/28/2012   History of tobacco abuse 07/25/2011   Dyslipidemia 07/25/2011   H/O chest pain 07/25/2011   Right bundle branch block 07/25/2011   PCP:  Dorcas Carrow, DO Pharmacy:   [facility] [phone] [place],  [place] - [address] [name] [address] Phone: [phone] Fax: [phone]  [facility] - [place], [place] - [address] [place] [address] Phone: [phone] Fax: [phone]  [facility] - [place], [place] - [address] [place] [address] Phone: [phone] Fax: [phone]     Social Determinants of Health (SDOH) Social History: SDOH Screenings   Food Insecurity: No Food Insecurity (10/17/2022)  Housing: Low Risk  (10/17/2022)  Transportation Needs: No Transportation Needs  (10/17/2022)  Recent Concern: Transportation Needs - Unmet Transportation Needs (09/30/2022)  Utilities: Not At Risk (10/17/2022)  Alcohol Screen: Low Risk  (10/17/2022)  Depression (PHQ2-9): Low Risk  (01/10/2023)  Financial Resource Strain: Low Risk  (10/17/2022)  Physical Activity: Inactive (10/17/2022)  Social Connections: Socially Isolated (10/17/2022)  Stress: No Stress Concern Present (10/17/2022)  Tobacco Use: High Risk (05/21/2023)   SDOH Interventions:     Readmission Risk Interventions    06/15/2023   12:46 PM 07/13/2021   10:26 AM  Readmission Risk Prevention Plan  Transportation Screening Complete Complete  Medication Review (RN Care Manager) Complete Complete  HRI or Home Care Consult  Complete  SW Recovery Care/Counseling Consult  Complete  Palliative Care Screening  Not Applicable  Skilled Nursing Facility  Not Applicable

## 2023-06-15 NOTE — Consult Note (Signed)
PHARMACY - ANTICOAGULATION CONSULT NOTE  Pharmacy Consult for heparin infusion Indication: pulmonary embolus  Allergies  Allergen Reactions   Plavix [Clopidogrel] Other (See Comments)    States "I hurt so bad I couldn't get out of bed."   Other Itching    Cat Gut sutures    Oxycodone Hives   Amlodipine Hives   Atorvastatin Other (See Comments)    weakness   Augmentin [Amoxicillin-Pot Clavulanate] Diarrhea   Azithromycin Hives   Contrast Media [Iodinated Contrast Media] Hives   Doxycycline Diarrhea   Erythromycin Diarrhea   Hydrocodone Nausea Only   Lactose Intolerance (Gi) Diarrhea    Bloating   Minocycline Diarrhea   Nsaids Other (See Comments)    Internal Bleeding   Relafen [Nabumetone] Hives   Sulfa Antibiotics Hives   Synthroid [Levothyroxine] Diarrhea   Tetracyclines & Related Diarrhea   Tilactase    Tramadol Rash    Takes +/- Benadryl   Patient Measurements: Height: 5\' 3"  (160 cm) Weight: 52.2 kg (115 lb) IBW/kg (Calculated) : 52.4 Heparin Dosing Weight: 52.2 kg  Vital Signs: Temp: 97.5 F (36.4 C) (10/31 1300) Temp Source: Bladder (10/31 1200) BP: 118/56 (10/31 1300) Pulse Rate: 108 (10/31 1300)  Labs: Recent Labs    06/14/23 1353 06/14/23 1547 06/14/23 2158 06/14/23 2158 06/15/23 0030 06/15/23 0227 06/15/23 0516 06/15/23 0744 06/15/23 1444  HGB 13.9 12.1  --   --   --   --  9.9*  --   --   HCT 41.3 36.5  --   --   --   --  30.1*  --   --   PLT 438* 347  --   --   --   --  425*  --   --   APTT  --   --  26  --   --   --   --   --   --   LABPROT  --  17.8*  --   --   --   --   --   --   --   INR  --  1.4*  --   --   --   --   --   --   --   HEPARINUNFRC  --   --   --   --   --   --  0.48  --  0.29*  CREATININE 1.16*  --   --   --   --   --  1.12*  --   --   TROPONINIHS  --   --  150*   < > 352* 365*  --  414*  --    < > = values in this interval not displayed.   Estimated Creatinine Clearance: 31.9 mL/min (A) (by C-G formula based on SCr of  1.12 mg/dL (H)).  Medical History: Past Medical History:  Diagnosis Date   Abdominal distension 03/18/2012   Abnormal weight loss 10/30/2017   Acute kidney injury (HCC) 03/22/2012   Aneurysm (HCC)    right eye   Anxiety    Blind right eye    Cardiac arrest [facility])    Cataract    right eye   Claudication (HCC)    Complication of anesthesia 03/15/2012   "crashed" during first part of 3-part back surgery.    COPD (chronic obstructive pulmonary disease) (HCC)    Delirium 03/18/2012   Dyspnea    GERD (gastroesophageal reflux disease)    Heart murmur    Hypertension  Leaky heart valve    Oxygen deficit    uses O2 at night 2 liters   Peripheral vascular disease (HCC)    Scoliosis    Sepsis (HCC)    Shock (HCC) 03/23/2012   SIRS (systemic inflammatory response syndrome) (HCC) 03/23/2012   Spinal stenosis    Stroke (HCC)    Eye   Tachycardia    Medications:  No home anticoagulants per pharmacist review  Assessment: 82 yo female presented to the ED due to pain in lower back and legs.  Patient was scheduled for bilateral femoral vascular procedures on 10/31.  Imaging today reveal PE.  Pharmacy consulted to start heparin infusion.  Goal of Therapy:  Heparin level 0.3-0.7 units/ml Monitor platelets by anticoagulation protocol: Yes  10/31 0516 HL 0.48, therapeutic x 1 10/31 1444 HL 0.29; subtherapeutic    Plan:  HL subtherapeutic Give 800 unit bolus x 1  Increase heparin infusion to 950 units/hr  Recheck HL 8 hours after rate adjustment  CBC daily while on heparin  [name], PharmD Pharmacy Resident  06/15/2023 3:10 PM

## 2023-06-15 NOTE — Plan of Care (Signed)
  Problem: Education: Goal: Knowledge of General Education information will improve Description: Including pain rating scale, medication(s)/side effects and non-pharmacologic comfort measures Outcome: Progressing   Problem: Health Behavior/Discharge Planning: Goal: Ability to manage health-related needs will improve Outcome: Progressing   Problem: Clinical Measurements: Goal: Ability to maintain clinical measurements within normal limits will improve Outcome: Progressing Goal: Will remain free from infection Outcome: Progressing Goal: Diagnostic test results will improve Outcome: Progressing Goal: Respiratory complications will improve Outcome: Progressing Goal: Cardiovascular complication will be avoided Outcome: Progressing   Problem: Activity: Goal: Risk for activity intolerance will decrease Outcome: Progressing   Problem: Nutrition: Goal: Adequate nutrition will be maintained Outcome: Progressing   Problem: Pain Management: Goal: General experience of comfort will improve Outcome: Progressing   Problem: Fluid Volume: Goal: Ability to maintain a balanced intake and output will improve Outcome: Progressing   Problem: Nutritional: Goal: Maintenance of adequate nutrition will improve Outcome: Progressing Goal: Progress toward achieving an optimal weight will improve Outcome: Progressing   Problem: Tissue Perfusion: Goal: Adequacy of tissue perfusion will improve Outcome: Progressing

## 2023-06-15 NOTE — Progress Notes (Signed)
Initial Nutrition Assessment  DOCUMENTATION CODES:   Non-severe (moderate) malnutrition in context of chronic illness  INTERVENTION:   Vital 1.2@50ml /hr- Initiate at 32ml/hr and increase by 38ml/hr q 8 hours until goal rate is reached.   Free water flushes 30ml q4 hours to maintain tube patency   Regimen provides 1440kcal/day, 90g/day protein and 1159ml/day of free water.   Pt at high refeed risk; recommend monitor potassium, magnesium and phosphorus labs daily until stable  Daily weights   NUTRITION DIAGNOSIS:   Moderate Malnutrition related to chronic illness as evidenced by moderate fat depletion, moderate muscle depletion, severe muscle depletion.  GOAL:   Provide needs based on ASPEN/SCCM guidelines  MONITOR:   Vent status, Labs, Weight trends, TF tolerance, I & O's, Skin  REASON FOR ASSESSMENT:   Ventilator    ASSESSMENT:   82 y/o female with h/o HTN, COPD, PAD, anxiety, depression, chronic pain, hypothyroidism, CAD, GERD, CVA and PNA (lengthy admission secondary to ARDS requiring tracheostomy/PEG tube- now removed 2013) and who is now admitted with PNA, sepsis, AKI and PE.  Pt sedated and ventilated. OGT in place. Will plan to initiate tube feeds today. Pt is at high refeed risk. Per chart, pt is weight stable at baseline.   Medications reviewed and include: colace, solu-cortef, insulin, protonix, miralax, doxycycline, heparin, levophed, zosyn, propofol   Labs reviewed: K 3.2(L), BUN 37(H), creat 1.12(H), P 4.2 wnl, Mg 2.4 wnl Wbc- 31.4(H), Hgb 9.9(L), Hct 30.1(L) Cbgs- 151, 195, 198, 192, 175 x 24 hrs   Patient is currently intubated on ventilator support MV: 7.2 L/min Temp (24hrs), Avg:98.3 F (36.8 C), Min:97.2 F (36.2 C), Max:100.4 F (38 C)  Propofol: 7.83 ml/hr- provides 207kcal/day   MAP- >74mmHg   UOP-   NUTRITION - FOCUSED PHYSICAL EXAM:  Flowsheet Row Most Recent Value  Orbital Region Moderate depletion  Upper Arm Region Severe  depletion  Thoracic and Lumbar Region Moderate depletion  Buccal Region Moderate depletion  Temple Region Severe depletion  Clavicle Bone Region Moderate depletion  Clavicle and Acromion Bone Region Moderate depletion  Scapular Bone Region Moderate depletion  Dorsal Hand Moderate depletion  Patellar Region Severe depletion  Anterior Thigh Region Severe depletion  Posterior Calf Region Severe depletion  Edema (RD Assessment) None  Hair Reviewed  Eyes Reviewed  Mouth Reviewed  Skin Reviewed  Nails Reviewed   Diet Order:   Diet Order             Diet NPO time specified  Diet effective now                  EDUCATION NEEDS:   No education needs have been identified at this time  Skin:  Skin Assessment: Reviewed RN Assessment  Last BM:  pta  Height:   Ht Readings from Last 1 Encounters:  06/14/23 5\' 3"  (1.6 m)    Weight:   Wt Readings from Last 1 Encounters:  06/14/23 52.2 kg    Ideal Body Weight:  52.2 kg  BMI:  Body mass index is 20.37 kg/m.  Estimated Nutritional Needs:   Kcal:  1276kcal/day  Protein:  80-90g/day  Fluid:  1.3-1.5L/day  Betsey Holiday MS, RD, LDN Please refer to [facility] for RD and/or RD on-call/weekend/after hours pager

## 2023-06-15 NOTE — ED Notes (Signed)
Patient CBG at this time. 183

## 2023-06-15 NOTE — Consult Note (Signed)
PHARMACY - ANTICOAGULATION CONSULT NOTE  Pharmacy Consult for heparin infusion Indication: pulmonary embolus  Allergies  Allergen Reactions   Plavix [Clopidogrel] Other (See Comments)    States "I hurt so bad I couldn't get out of bed."   Other Itching    Cat Gut sutures    Oxycodone Hives   Amlodipine Hives   Atorvastatin Other (See Comments)    weakness   Augmentin [Amoxicillin-Pot Clavulanate] Diarrhea   Azithromycin Hives   Contrast Media [Iodinated Contrast Media] Hives   Doxycycline Diarrhea   Erythromycin Diarrhea   Hydrocodone Nausea Only   Lactose Intolerance (Gi) Diarrhea    Bloating   Minocycline Diarrhea   Nsaids Other (See Comments)    Internal Bleeding   Relafen [Nabumetone] Hives   Sulfa Antibiotics Hives   Synthroid [Levothyroxine] Diarrhea   Tetracyclines & Related Diarrhea   Tilactase    Tramadol Rash    Takes +/- Benadryl    Patient Measurements: Height: 5\' 3"  (160 cm) Weight: 52.2 kg (115 lb) IBW/kg (Calculated) : 52.4 Heparin Dosing Weight: 52.2 kg  Vital Signs: Temp: 97.5 F (36.4 C) (10/31 0530) BP: 96/55 (10/31 0530) Pulse Rate: 102 (10/31 0530)  Labs: Recent Labs    06/12/23 1210 06/14/23 1353 06/14/23 1547 06/14/23 2158 06/15/23 0030 06/15/23 0227 06/15/23 0516  HGB 13.1 13.9 12.1  --   --   --  9.9*  HCT 40.4 41.3 36.5  --   --   --  30.1*  PLT 338 438* 347  --   --   --  425*  APTT  --   --   --  26  --   --   --   LABPROT 16.7*  --  17.8*  --   --   --   --   INR 1.3*  --  1.4*  --   --   --   --   HEPARINUNFRC  --   --   --   --   --   --  0.48  CREATININE 1.08* 1.16*  --   --   --   --   --   TROPONINIHS  --   --   --  150* 352* 365*  --     Estimated Creatinine Clearance: 30.8 mL/min (A) (by C-G formula based on SCr of 1.16 mg/dL (H)).   Medical History: Past Medical History:  Diagnosis Date   Abdominal distension 03/18/2012   Abnormal weight loss 10/30/2017   Acute kidney injury (HCC) 03/22/2012   Aneurysm (HCC)     right eye   Anxiety    Blind right eye    Cardiac arrest [facility])    Cataract    right eye   Claudication (HCC)    Complication of anesthesia 03/15/2012   "crashed" during first part of 3-part back surgery.    COPD (chronic obstructive pulmonary disease) (HCC)    Delirium 03/18/2012   Dyspnea    GERD (gastroesophageal reflux disease)    Heart murmur    Hypertension    Leaky heart valve    Oxygen deficit    uses O2 at night 2 liters   Peripheral vascular disease (HCC)    Scoliosis    Sepsis (HCC)    Shock (HCC) 03/23/2012   SIRS (systemic inflammatory response syndrome) (HCC) 03/23/2012   Spinal stenosis    Stroke (HCC)    Eye   Tachycardia     Medications:  No home anticoagulants per pharmacist review  Assessment:  82 yo female presented to the ED due to pain in lower back and legs.  Patient was scheduled for bilateral femoral vascular procedures on 10/31.  Imaging today reveal PE.  Pharmacy consulted to start heparin infusion.  Goal of Therapy:  Heparin level 0.3-0.7 units/ml Monitor platelets by anticoagulation protocol: Yes  10/31 0516 HL 0.48, therapeutic x 1   Plan:  Continue heparin infusion at 850 units/hr Recheck HL in 8 hrs to confirm CBC daily while on heparin  Otelia Sergeant, PharmD, [facility] 06/15/2023 6:24 AM

## 2023-06-15 NOTE — Progress Notes (Signed)
*  PRELIMINARY RESULTS* Echocardiogram 2D Echocardiogram has been performed.  Savannah Mcclure 06/15/2023, 12:24 PM

## 2023-06-15 NOTE — Consult Note (Signed)
PHARMACY CONSULT NOTE - ELECTROLYTES  Pharmacy Consult for Electrolyte Monitoring and Replacement   Recent Labs: Height: 5\' 3"  (160 cm) Weight: 52.2 kg (115 lb) IBW/kg (Calculated) : 52.4 Estimated Creatinine Clearance: 31.9 mL/min (A) (by C-G formula based on SCr of 1.12 mg/dL (H)).  Potassium (mmol/L)  Date Value  06/15/2023 3.2 (L)  09/11/2012 3.8   Magnesium (mg/dL)  Date Value  40/98/1191 2.4   Calcium (mg/dL)  Date Value  47/82/9562 7.9 (L)   Calcium, Total (mg/dL)  Date Value  13/03/6577 8.5   Albumin (g/dL)  Date Value  46/96/2952 2.7 (L)  06/08/2023 4.2   Phosphorus (mg/dL)  Date Value  84/13/2440 4.2   Sodium (mmol/L)  Date Value  06/15/2023 138  06/08/2023 142  09/12/2012 132 (L)   Corrected Ca: 8.94 mg/dL  Assessment  Savannah Mcclure is a 82 y.o. female presenting with bilateral lower extremity back pain and SOB. PMH significant for COPD, PAD, HLD, CAD, CVA, hypothyroidism, & anxiety/depression. Pharmacy has been consulted to monitor and replace electrolytes.  Goal of Therapy: Electrolytes WNL  Plan:  KCl 40 mEq per tube x 1 ordered by NP No other electrolyte replacement indicated at this time  Check BMP, Mg, Phos with AM labs  Thank you for allowing pharmacy to be a part of this patient's care.  [name], PharmD Pharmacy Resident  06/15/2023 6:46 AM

## 2023-06-16 ENCOUNTER — Inpatient Hospital Stay: Payer: Medicare Other

## 2023-06-16 ENCOUNTER — Other Ambulatory Visit: Payer: Self-pay

## 2023-06-16 DIAGNOSIS — I739 Peripheral vascular disease, unspecified: Secondary | ICD-10-CM

## 2023-06-16 DIAGNOSIS — J449 Chronic obstructive pulmonary disease, unspecified: Secondary | ICD-10-CM

## 2023-06-16 DIAGNOSIS — J189 Pneumonia, unspecified organism: Secondary | ICD-10-CM | POA: Diagnosis not present

## 2023-06-16 DIAGNOSIS — N179 Acute kidney failure, unspecified: Secondary | ICD-10-CM | POA: Diagnosis not present

## 2023-06-16 DIAGNOSIS — Z515 Encounter for palliative care: Secondary | ICD-10-CM

## 2023-06-16 DIAGNOSIS — J9601 Acute respiratory failure with hypoxia: Secondary | ICD-10-CM | POA: Diagnosis not present

## 2023-06-16 DIAGNOSIS — G928 Other toxic encephalopathy: Secondary | ICD-10-CM

## 2023-06-16 LAB — BLOOD GAS, VENOUS
Acid-base deficit: 6.9 mmol/L — ABNORMAL HIGH (ref 0.0–2.0)
Bicarbonate: 20.9 mmol/L (ref 20.0–28.0)
FIO2: 60 %
MECHVT: 400 mL
Mechanical Rate: 24
O2 Saturation: 86.1 %
PEEP: 5 cmH2O
Patient temperature: 37
pCO2, Ven: 50 mm[Hg] (ref 44–60)
pH, Ven: 7.23 — ABNORMAL LOW (ref 7.25–7.43)
pO2, Ven: 53 mm[Hg] — ABNORMAL HIGH (ref 32–45)

## 2023-06-16 LAB — BETA-HYDROXYBUTYRIC ACID: Beta-Hydroxybutyric Acid: 0.14 mmol/L (ref 0.05–0.27)

## 2023-06-16 LAB — BASIC METABOLIC PANEL
Anion gap: 14 (ref 5–15)
BUN: 46 mg/dL — ABNORMAL HIGH (ref 8–23)
CO2: 20 mmol/L — ABNORMAL LOW (ref 22–32)
Calcium: 7.8 mg/dL — ABNORMAL LOW (ref 8.9–10.3)
Chloride: 100 mmol/L (ref 98–111)
Creatinine, Ser: 1.7 mg/dL — ABNORMAL HIGH (ref 0.44–1.00)
GFR, Estimated: 30 mL/min — ABNORMAL LOW (ref >60)
Glucose, Bld: 167 mg/dL — ABNORMAL HIGH (ref 70–99)
Potassium: 4.6 mmol/L (ref 3.5–5.1)
Sodium: 134 mmol/L — ABNORMAL LOW (ref 135–145)

## 2023-06-16 LAB — BLOOD GAS, ARTERIAL
Acid-base deficit: 6.1 mmol/L — ABNORMAL HIGH (ref 0.0–2.0)
Acid-base deficit: 6.6 mmol/L — ABNORMAL HIGH (ref 0.0–2.0)
Acid-base deficit: 10.1 mmol/L — ABNORMAL HIGH (ref 0.0–2.0)
Bicarbonate: 20.2 mmol/L (ref 20.0–28.0)
Bicarbonate: 21 mmol/L (ref 20.0–28.0)
Bicarbonate: 17.2 mmol/L — ABNORMAL LOW (ref 20.0–28.0)
FIO2: 60 %
FIO2: 50 %
FIO2: 50 %
MECHVT: 400 mL
MECHVT: 400 mL
MECHVT: 400 mL
Mechanical Rate: 24
Mechanical Rate: 24
Mechanical Rate: 28
O2 Saturation: 96.1 %
O2 Saturation: 95 %
O2 Saturation: 94.9 %
PEEP: 5 cmH2O
PEEP: 5 cmH2O
PEEP: 5 cmH2O
Patient temperature: 37
Patient temperature: 37
Patient temperature: 37
RATE: 24 {breaths}/min
pCO2 arterial: 42 mm[Hg] (ref 32–48)
pCO2 arterial: 49 mm[Hg] — ABNORMAL HIGH (ref 32–48)
pCO2 arterial: 42 mm[Hg] (ref 32–48)
pH, Arterial: 7.29 — ABNORMAL LOW (ref 7.35–7.45)
pH, Arterial: 7.24 — ABNORMAL LOW (ref 7.35–7.45)
pH, Arterial: 7.22 — ABNORMAL LOW (ref 7.35–7.45)
pO2, Arterial: 70 mm[Hg] — ABNORMAL LOW (ref 83–108)
pO2, Arterial: 65 mm[Hg] — ABNORMAL LOW (ref 83–108)
pO2, Arterial: 65 mm[Hg] — ABNORMAL LOW (ref 83–108)

## 2023-06-16 LAB — GLUCOSE, CAPILLARY
Glucose-Capillary: 131 mg/dL — ABNORMAL HIGH (ref 70–99)
Glucose-Capillary: 131 mg/dL — ABNORMAL HIGH (ref 70–99)
Glucose-Capillary: 143 mg/dL — ABNORMAL HIGH (ref 70–99)
Glucose-Capillary: 159 mg/dL — ABNORMAL HIGH (ref 70–99)
Glucose-Capillary: 184 mg/dL — ABNORMAL HIGH (ref 70–99)
Glucose-Capillary: 211 mg/dL — ABNORMAL HIGH (ref 70–99)

## 2023-06-16 LAB — CBC
HCT: 30.2 % — ABNORMAL LOW (ref 36.0–46.0)
Hemoglobin: 10.2 g/dL — ABNORMAL LOW (ref 12.0–15.0)
MCH: 31.3 pg (ref 26.0–34.0)
MCHC: 33.8 g/dL (ref 30.0–36.0)
MCV: 92.6 fL (ref 80.0–100.0)
Platelets: 392 10*3/uL (ref 150–400)
RBC: 3.26 MIL/uL — ABNORMAL LOW (ref 3.87–5.11)
RDW: 14.8 % (ref 11.5–15.5)
WBC: 33.9 10*3/uL — ABNORMAL HIGH (ref 4.0–10.5)
nRBC: 0.2 % (ref 0.0–0.2)

## 2023-06-16 LAB — MAGNESIUM: Magnesium: 2.2 mg/dL (ref 1.7–2.4)

## 2023-06-16 LAB — LEGIONELLA PNEUMOPHILA SEROGP 1 UR AG: L. pneumophila Serogp 1 Ur Ag: NEGATIVE

## 2023-06-16 LAB — NA AND K (SODIUM & POTASSIUM), RAND UR
Potassium Urine: 63 mmol/L
Sodium, Ur: <10 mmol/L

## 2023-06-16 LAB — PHOSPHORUS: Phosphorus: 4 mg/dL (ref 2.5–4.6)

## 2023-06-16 LAB — LACTIC ACID, PLASMA
Lactic Acid, Venous: 1.4 mmol/L (ref 0.5–1.9)
Lactic Acid, Venous: 1.4 mmol/L (ref 0.5–1.9)

## 2023-06-16 LAB — CREATININE, URINE, RANDOM: Creatinine, Urine: 88 mg/dL

## 2023-06-16 LAB — HEPARIN LEVEL (UNFRACTIONATED)
Heparin Unfractionated: 0.45 [IU]/mL (ref 0.30–0.70)
Heparin Unfractionated: 0.54 [IU]/mL (ref 0.30–0.70)

## 2023-06-16 LAB — PROCALCITONIN: Procalcitonin: 6.67 ng/mL

## 2023-06-16 MED ORDER — DEXMEDETOMIDINE HCL IN NACL 400 MCG/100ML IV SOLN
0.0000 ug/kg/h | INTRAVENOUS | Status: DC
  Administered 2023-06-16: 0.4 ug/kg/h via INTRAVENOUS
  Administered 2023-06-17: 0.8 ug/kg/h via INTRAVENOUS
  Filled 2023-06-16 (×3): qty 100

## 2023-06-16 MED ORDER — LIDOCAINE 5 % EX PTCH
1.0000 | MEDICATED_PATCH | CUTANEOUS | Status: DC
  Administered 2023-06-16 – 2023-06-17 (×2): 1 via TRANSDERMAL
  Filled 2023-06-16 (×3): qty 1

## 2023-06-16 MED ORDER — HYDROXYZINE HCL 25 MG PO TABS
25.0000 mg | ORAL_TABLET | Freq: Three times a day (TID) | ORAL | Status: DC
  Administered 2023-06-16 (×2): 25 mg
  Filled 2023-06-16 (×2): qty 1

## 2023-06-16 MED ORDER — SODIUM CHLORIDE 0.9% FLUSH
10.0000 mL | Freq: Two times a day (BID) | INTRAVENOUS | Status: DC
  Administered 2023-06-16 – 2023-06-17 (×3): 10 mL

## 2023-06-16 MED ORDER — OXYCODONE HCL 5 MG PO TABS
5.0000 mg | ORAL_TABLET | Freq: Four times a day (QID) | ORAL | Status: DC
  Administered 2023-06-16 (×2): 5 mg
  Filled 2023-06-16 (×2): qty 1

## 2023-06-16 MED ORDER — SODIUM CHLORIDE 0.9% FLUSH: 10.0000 mL | INTRAVENOUS | Status: DC | PRN

## 2023-06-16 NOTE — Plan of Care (Addendum)
Patient dys synchronus on the vent. Fentanyl increased to 100 mcg/hour, Atarax, lidocaine patch and oxycodone restarted (Pt home meds).  Problem: Clinical Measurements: Goal: Will remain free from infection Outcome: Progressing Goal: Respiratory complications will improve Outcome: Progressing Goal: Cardiovascular complication will be avoided Outcome: Progressing   Problem: Activity: Goal: Risk for activity intolerance will decrease Outcome: Progressing   Problem: Nutrition: Goal: Adequate nutrition will be maintained Outcome: Progressing   Problem: Pain Management: Goal: General experience of comfort will improve Outcome: Progressing   Problem: Skin Integrity: Goal: Risk for impaired skin integrity will decrease Outcome: Progressing

## 2023-06-16 NOTE — Consult Note (Signed)
PHARMACY CONSULT NOTE - ELECTROLYTES  Pharmacy Consult for Electrolyte Monitoring and Replacement   Recent Labs: Height: 5\' 3"  (160 cm) Weight: 52.4 kg (115 lb 8.3 oz) IBW/kg (Calculated) : 52.4 Estimated Creatinine Clearance: 21.1 mL/min (A) (by C-G formula based on SCr of 1.7 mg/dL (H)).  Potassium (mmol/L)  Date Value   4.6  09/11/2012 3.8   Magnesium (mg/dL)  Date Value  16/05/9603 2.2   Calcium (mg/dL)  Date Value  54/04/8118 7.8 (L)   Calcium, Total (mg/dL)  Date Value  14/78/2956 8.5   Albumin (g/dL)  Date Value  21/30/8657 2.7 (L)  06/08/2023 4.2   Phosphorus (mg/dL)  Date Value  84/69/6295 4.0   Sodium (mmol/L)  Date Value   134 (L)  06/08/2023 142  09/12/2012 132 (L)   Corrected Ca: 8.84 mg/dL  Assessment  Savannah Mcclure is a 82 y.o. female presenting with bilateral lower extremity back pain and SOB. PMH significant for COPD, PAD, HLD, CAD, CVA, hypothyroidism, & anxiety/depression. Pharmacy has been consulted to monitor and replace electrolytes.  Goal of Therapy: Electrolytes WNL  Plan:  No electrolyte replacement indicated at this time  Check BMP, Mg, Phos with AM labs  Thank you for allowing pharmacy to be a part of this patient's care.  [name], PharmD Pharmacy Resident   6:52 AM

## 2023-06-16 NOTE — Consult Note (Signed)
PHARMACY - ANTICOAGULATION CONSULT NOTE  Pharmacy Consult for heparin infusion Indication: pulmonary embolus  Allergies  Allergen Reactions   Plavix [Clopidogrel] Other (See Comments)    States "I hurt so bad I couldn't get out of bed."   Other Itching    Cat Gut sutures    Oxycodone Hives   Amlodipine Hives   Atorvastatin Other (See Comments)    weakness   Augmentin [Amoxicillin-Pot Clavulanate] Diarrhea   Azithromycin Hives   Contrast Media [Iodinated Contrast Media] Hives   Doxycycline Diarrhea   Erythromycin Diarrhea   Hydrocodone Nausea Only   Lactose Intolerance (Gi) Diarrhea    Bloating   Minocycline Diarrhea   Nsaids Other (See Comments)    Internal Bleeding   Relafen [Nabumetone] Hives   Sulfa Antibiotics Hives   Synthroid [Levothyroxine] Diarrhea   Tetracyclines & Related Diarrhea   Tilactase    Tramadol Rash    Takes +/- Benadryl   Patient Measurements: Height: 5\' 3"  (160 cm) Weight: 52.2 kg (115 lb) IBW/kg (Calculated) : 52.4 Heparin Dosing Weight: 52.2 kg  Vital Signs: Temp: 98.2 F (36.8 C) (11/01 0100) Temp Source: Bladder (11/01 0100) BP: 103/55 (11/01 0100) Pulse Rate: 107 (11/01 0100)  Labs: Recent Labs    06/12/2023 1353 05/17/2023 1547 05/22/2023 2158 06/15/23 0030 06/15/23 0227 06/15/23 0516 06/15/23 0744 06/15/23 1444 06/15/23 1752 06/15/23 1753  0020  HGB 13.9 12.1  --   --   --  9.9*  --   --   --  10.9* 10.2*  HCT 41.3 36.5  --   --   --  30.1*  --   --   --  33.0* 30.2*  PLT 438* 347  --   --   --  425*  --   --   --   --  392  APTT  --   --  26  --   --   --   --   --   --   --   --   LABPROT  --  17.8*  --   --   --   --   --   --   --   --   --   INR  --  1.4*  --   --   --   --   --   --   --   --   --   HEPARINUNFRC  --   --   --   --   --  0.48  --  0.29*  --   --  0.54  CREATININE 1.16*  --   --   --   --  1.12*  --   --   --   --   --   TROPONINIHS  --   --  150*   < > 365*  --  414*  --  199*  --   --    < > =  values in this interval not displayed.   Estimated Creatinine Clearance: 31.9 mL/min (A) (by C-G formula based on SCr of 1.12 mg/dL (H)).  Medical History: Past Medical History:  Diagnosis Date   Abdominal distension 03/18/2012   Abnormal weight loss 10/30/2017   Acute kidney injury (HCC) 03/22/2012   Aneurysm (HCC)    right eye   Anxiety    Blind right eye    Cardiac arrest [facility])    Cataract    right eye   Claudication (HCC)    Complication of anesthesia  03/15/2012   "crashed" during first part of 3-part back surgery.    COPD (chronic obstructive pulmonary disease) (HCC)    Delirium 03/18/2012   Dyspnea    GERD (gastroesophageal reflux disease)    Heart murmur    Hypertension    Leaky heart valve    Oxygen deficit    uses O2 at night 2 liters   Peripheral vascular disease (HCC)    Scoliosis    Sepsis (HCC)    Shock (HCC) 03/23/2012   SIRS (systemic inflammatory response syndrome) (HCC) 03/23/2012   Spinal stenosis    Stroke (HCC)    Eye   Tachycardia    Medications:  No home anticoagulants per pharmacist review  Assessment: 82 yo female presented to the ED due to pain in lower back and legs.  Patient was scheduled for bilateral femoral vascular procedures on 10/31.  Imaging today reveal PE.  Pharmacy consulted to start heparin infusion.  Goal of Therapy:  Heparin level 0.3-0.7 units/ml Monitor platelets by anticoagulation protocol: Yes  10/31 0516 HL 0.48, therapeutic x 1 10/31 1444 HL 0.29; subtherapeutic 11/01 0020 HL 0.54, therapeutic x 1   Plan:  Continue heparin infusion at 950 units/hr  Recheck HL 8 hours to confirm CBC daily while on heparin  Otelia Sergeant, PharmD, [facility]  1:11 AM

## 2023-06-16 NOTE — Progress Notes (Signed)
Peripherally Inserted Central Catheter Placement  The IV Nurse has discussed with the patient and/or persons authorized to consent for the patient, the purpose of this procedure and the potential benefits and risks involved with this procedure.  The benefits include less needle sticks, lab draws from the catheter, and the patient may be discharged home with the catheter. Risks include, but not limited to, infection, bleeding, blood clot (thrombus formation), and puncture of an artery; nerve damage and irregular heartbeat and possibility to perform a PICC exchange if needed/ordered by physician.  Alternatives to this procedure were also discussed.  Bard Power PICC patient education guide, fact sheet on infection prevention and patient information card has been provided to patient /or left at bedside.    PICC Placement Documentation  PICC Triple Lumen  Left Basilic 52 cm 0 cm (Active)  Indication for Insertion or Continuance of Line Vasoactive infusions  1313  Exposed Catheter (cm) 0 cm  1313  Site Assessment Clean, Dry, Intact  1313  Lumen #1 Status Flushed;Saline locked;Blood return noted  1313  Lumen #2 Status Flushed;Saline locked;Blood return noted  1313  Lumen #3 Status Flushed;Saline locked;Blood return noted  1313  Dressing Type Transparent;Securing device  1313  Dressing Status Antimicrobial disc in place;Clean, Dry, Intact  1313  Line Care Connections checked and tightened  1313  Line Adjustment (NICU/IV Team Only) No  1313  Dressing Intervention New dressing;Adhesive placed at insertion site (IV team only)  1313  Dressing Change Due 06/23/23  1313    Daughter signed consent   Maximino Greenland , 1:50 PM

## 2023-06-16 NOTE — Progress Notes (Signed)
NAME:  Savannah Mcclure, MRN:  [id number], DOB:  Sep 06, 1940, LOS: 2 ADMISSION DATE:  05/31/2023, CHIEF COMPLAINT:  Respiratory Failure   History of Present Illness:   82 year old female presenting to [facility] ED from home on 06/01/2023 for evaluation of bilateral lower extremity lower back pain as well as shortness of breath.   History provided per chart review and daughter's telephone interview, patient unable to participate in interview at this time.  On arrival to EDP patient reporting significant lower back and bilateral lower extremity pain as well as shortness of breath.  Patient confirmed productive cough with EDP on arrival.   Daughter confirms the patient has been experiencing chest pain, fever and chills as well as a new cough. Patient was scheduled for a bilateral femoral endarterectomies on 06/15/2023 after being followed outpatient by vascular surgery for severe PAD.  Daughter reports the patient's pain has not been well-controlled at home.  The patient has been unable to walk, screaming out at times with correlating shortness of breath and " blue lips".  The patient has been telling the daughter she feels as though she is 'on her way out'. The daughter found her current state extremely upsetting stating she "did not want to watch her die".  Daughter had called EMS 3 times, between 10/28 and 10/30, but the patient refused to be taken to the ED. daughter confirms the patient smokes 2 packs of cigarettes daily and has been for the past 5 years prior to that she reports a 50+ pack year history.  Daughter also confirms intermittent marijuana use and some EtOH use but not daily. Of note patient was seen in the ED on 06/12/2023 and prescribed short course of oxycodone.   ED course: Upon arrival patient alert and responsive- tachycardic, tachypneic and hypoxic at 86% on room air.  Lab work concerning for sepsis with lactic acidosis and leukocytosis, mild AKI and NAGMA as well as mild  transaminitis.  Initial chest x-ray concerning for pneumonia.  Sepsis protocol initiated with empiric antibiotics and IV fluid resuscitation.  CT angio ordered the patient has contrast allergy requiring premedication.  After CT angio patient's hypoxia, tachycardia and work of breathing worsened significantly ultimately requiring urgent intubation and mechanical ventilatory support.  CT angio positive for PE without right heart strain as well as significant bilateral groundglass opacities.  Medications given: Benadryl, heparin bolus and drip initiation, Solu-Medrol, Versed/etomidate/succinylcholine, Levaquin, 3 L IV fluid bolus, propofol and fentanyl drip started, IV contrast  Pertinent  Medical History  Blind right eye COPD Hypertension PAD CVA Hyperlipidemia Anxiety & depression CAD Hypothyroidism Thoracic spondylosis without myelopathy  Significant Hospital Events: Including procedures, antibiotic start and stop dates in addition to other pertinent events   06/13/2023: Admit patient to ICU with acute hypoxic respiratory failure secondary to acute PE and multifocal pneumonia in the setting of COPD and severe PAD. 06/15/2023: remained on the vent, increased oxygen requirements : oxygen requirements improved, remains vented  Interim History / Subjective:  Remains sedated, confused, doesn't follow commands.  Objective   Blood pressure (!) 104/54, pulse (!) 116, temperature 99.1 F (37.3 C), resp. rate 20, height 5\' 3"  (1.6 m), weight 52.4 kg, SpO2 97%.    Vent Mode: PRVC FiO2 (%):  [50 %-60 %] 50 % Set Rate:  [15 bmp-24 bmp] 24 bmp Vt Set:  [400 mL] 400 mL PEEP:  [5 cmH20] 5 cmH20 Plateau Pressure:  [20 cmH20-22 cmH20] 22 cmH20   Intake/Output Summary (Last 24 hours) at  0940 Last  data filed at  0800 Gross per 24 hour  Intake 2978.35 ml  Output 625 ml  Net 2353.35 ml   Filed Weights   06/07/2023 1329  0321  Weight: 52.2 kg 52.4 kg     Examination: Physical Exam Constitutional:      General: She is not in acute distress.    Appearance: She is ill-appearing.  Cardiovascular:     Rate and Rhythm: Normal rate and regular rhythm.     Pulses: Normal pulses.     Heart sounds: Normal heart sounds.  Pulmonary:     Effort: Pulmonary effort is normal.     Breath sounds: Normal breath sounds.     Comments: Ventilated breath sounds Abdominal:     Palpations: Abdomen is soft.  Neurological:     Mental Status: She is alert. She is disoriented.     Assessment & Plan:   Neurology #Toxic Metabolic Encephalopathy #Advanced Dementia #Carotid artery stenosis  Presenting with worsening altered mental status secondary to infection (pneumonia). Intubated for respiratory failure and initiated on Propofol with PRN fentanyl for analgosedation. Will continue with daily wake up assessments.  -Maintain a RASS goal of -1 -Propofol and Fentanyl to maintain RASS goal -Avoid sedating medications as able -Hold Gabapentin and diphenhydramine -add home oxycodone to help with pain  Cardiovascular #NSTEMI #Sepsis #CAD #PAD  Presenting with circulatory shock secondary to sepsis from pneumonia with component of vasodilatory effect of propofol. Maintained on low dose nor-epinephrine, with goal MAP > 65 mmHg. She has a history of CAD, followed by Dr. Mariah Milling outpatient, and recently underwent a pharmacological myocardial perfusion scan showing low risk for MI. She also has a history of PAD noted on angiography with vascular surgery, with history of stenting and most recently found to have near occlusive stenosis of the right CFA, 85% stenosis in the right profunda femoris, and 60% disease in the left CFA. Appreciate input from cardiology and vascular surgery regarding management of anti-platelets and anti-coagulation. Continued on aspirin and holding ticagrelol  -continue heparin gtt -nor-epinephrine, goal MAP > 65 -lactic acid  WNL -continue to trend troponin -TTE re-assuring  Pulmonary #Acute Hypoxic Respiratory Failure #Subsegmental PE #Community Acquired Pneumonia #COPD  Was intubated for hypoxic respiratory failure, with imaging showing bilateral infiltrates with a RLL consolidation, consistent with infection. She's also found to have subsegmental PE for which heparin was initiated. On antibiotics and steroids (see below) and ventilator settings are improved today. Continue nebulizers for COPD.  -Full vent support, implement lung protective strategies -Plateau pressures less than 30 cm H20 -Wean FiO2 & PEEP as tolerated to maintain O2 sats >92% -Follow intermittent Chest X-ray & ABG as needed -Spontaneous Breathing Trials when respiratory parameters met and mental status permits -Duonebs standing every 6 hours -Implement VAP Bundle  Gastrointestinal #Transaminitis  Mild elevation in transaminases has resolved, holding hepatotoxins, but this is likely secondary to sepsis and hypotension. Initiating tube feeds and PPI for SUP.  -hold rosuvastatin  Renal #Acute Kidney Injury #Metabolic Acidosis  In the setting of sepsis and hypotension. Has also received contrast and we will hold off on any diuresis at this time. Avoiding nephrotoxins as able. Patient with mixed respiratory and metabolic acidosis.  -US kidneys pending -monitor I/O's -avoid nephrotoxins -trend ABG  Endocrine #Hypothyroidism  TSH of 47 a week ago. T4 is low normal, T3 is pending. Could represent subclinical hypothyroidism, but will await T3 before deciding on therapy. Patient on hydrocortisone for adjunctive therapy of severe pneumonia per CAPECOD study.  Hem/Onc  On heparin gtt for management of subsegmental PE. Hold ticagrelol.  ID #Community Acquired Pneumonia #Severe Sepsis  Found to have a RLL infiltrate and consolidation on CT chest. Will initiate broad spectrum antibiotics, including atypical coverage (with  Doxycycline). MRSA screen, respiratory cultures, and legionella/strep urinary antigen ordered.  -continue Doxycycline/Zosyn  Best Practice (right click and "Reselect all SmartList Selections" daily)   Diet/type: tubefeeds DVT prophylaxis: systemic heparin GI prophylaxis: PPI Lines: N/A Foley:  Yes, and it is still needed Code Status:  full code Last date of multidisciplinary goals of care discussion []  Labs   CBC: Recent Labs  Lab 06/12/23 1210 06/02/2023 1353 06/12/2023 1547 06/15/23 0516 06/15/23 1753  0020  WBC 21.2* 19.0* 14.2* 31.4*  --  33.9*  NEUTROABS  --  16.6* 12.2*  --   --   --   HGB 13.1 13.9 12.1 9.9* 10.9* 10.2*  HCT 40.4 41.3 36.5 30.1* 33.0* 30.2*  MCV 92.9 89.8 90.6 91.8  --  92.6  PLT 338 438* 347 425*  --  392    Basic Metabolic Panel: Recent Labs  Lab 06/12/23 1210 05/16/2023 1353 06/08/2023 2158 06/15/23 0516 06/15/23 1444  0345  NA 136 137  --  138  --  134*  K 3.4* 3.6  --  3.2* 3.0* 4.6  CL 99 99  --  104  --  100  CO2 23 20*  --  22  --  20*  GLUCOSE 167* 181*  --  225*  --  167*  BUN 18 36*  --  37*  --  46*  CREATININE 1.08* 1.16*  --  1.12*  --  1.70*  CALCIUM 9.0 8.8*  --  7.9*  --  7.8*  MG  --   --  1.4* 2.4  --  2.2  PHOS  --   --  3.5 4.2  --  4.0   GFR: Estimated Creatinine Clearance: 21.1 mL/min (A) (by C-G formula based on SCr of 1.7 mg/dL (H)). Recent Labs  Lab 05/29/2023 1353 05/26/2023 1547 05/21/2023 1759 05/23/2023 2158 06/15/23 0030 06/15/23 0516  0020  0345  0845  PROCALCITON  --   --   --  3.76  --  6.49  --  6.67  --   WBC 19.0* 14.2*  --   --   --  31.4* 33.9*  --   --   LATICACIDVEN 3.6* 2.2* 1.7 2.9* 1.6  --   --   --  1.4    Liver Function Tests: Recent Labs  Lab 05/28/2023 1353 06/15/23 0516  AST 57* 38  ALT 25 17  ALKPHOS 83 64  BILITOT 1.2 1.2  PROT 8.8* 6.2*  ALBUMIN 3.9 2.7*   No results for input(s): "LIPASE", "AMYLASE" in the last 168 hours. No  results for input(s): "AMMONIA" in the last 168 hours.  ABG    Component Value Date/Time   PHART 7.24 (L)  0834   PCO2ART 49 (H)  0834   PO2ART 65 (L)  0834   HCO3 21.0  0834   ACIDBASEDEF 6.6 (H)  0834   O2SAT 95  0834     Coagulation Profile: Recent Labs  Lab 06/12/23 1210 06/01/2023 1547  INR 1.3* 1.4*    Cardiac Enzymes: No results for input(s): "CKTOTAL", "CKMB", "CKMBINDEX", "TROPONINI" in the last 168 hours.  HbA1C: Hgb A1c MFr Bld  Date/Time Value Ref Range Status  06/15/2023 07:44 AM 5.9 (H) 4.8 - 5.6 % Final  Comment:    (NOTE) Pre diabetes:          5.7%-6.4%  Diabetes:              >6.4%  Glycemic control for   <7.0% adults with diabetes     CBG: Recent Labs  Lab 06/15/23 1548 06/15/23 2013 06/15/23 2350  0429  0757  GLUCAP 149* 111* 183* 184* 131*    Review of Systems:   Unable to obtain  Past Medical History:  She,  has a past medical history of Abdominal distension (03/18/2012), Abnormal weight loss (10/30/2017), Acute kidney injury (HCC) (03/22/2012), Aneurysm (HCC), Anxiety, Blind right eye, Cardiac arrest (HCC), Cataract, Claudication (HCC), Complication of anesthesia (03/15/2012), COPD (chronic obstructive pulmonary disease) (HCC), Delirium (03/18/2012), Dyspnea, GERD (gastroesophageal reflux disease), Heart murmur, Hypertension, Leaky heart valve, Oxygen deficit, Peripheral vascular disease (HCC), Scoliosis, Sepsis (HCC), Shock (HCC) (03/23/2012), SIRS (systemic inflammatory response syndrome) (HCC) (03/23/2012), Spinal stenosis, Stroke (HCC), and Tachycardia.   Surgical History:   Past Surgical History:  Procedure Laterality Date   BACK SURGERY     BREAST LUMPECTOMY Left    CAROTID PTA/STENT INTERVENTION Right 09/19/2022   Procedure: CAROTID PTA/STENT INTERVENTION;  Surgeon: Annice Needy, MD;  Location: [facility];  Service: Cardiovascular;  Laterality: Right;    CATARACT EXTRACTION W/PHACO Left 08/14/2019   Procedure: CATARACT EXTRACTION PHACO AND INTRAOCULAR LENS PLACEMENT (IOC) LEFT 10.22 00:59.7 42.4%;  Surgeon: Lockie Mola, MD;  [facility];  Service: Ophthalmology;  Laterality: Left;  leave it last patient per [place]   LOWER EXTREMITY ANGIOGRAPHY Right 06/20/2018   Procedure: LOWER EXTREMITY ANGIOGRAPHY;  Surgeon: Annice Needy, MD;  Location: [facility] INV[facility] CV [facility];  Service: Cardiovascular;  Laterality: Right;   LOWER EXTREMITY ANGIOGRAPHY Left 02/03/2020   Procedure: LOWER EXTREMITY ANGIOGRAPHY;  Surgeon: Annice Needy, MD;  Location: [facility] CV [facility];  Service: Cardiovascular;  Laterality: Left;   LOWER EXTREMITY ANGIOGRAPHY Right 02/20/2023   Procedure: Lower Extremity Angiography;  Surgeon: Annice Needy, MD;  Location: [facility] INV[facility] CV [facility];  Service: Cardiovascular;  Laterality: Right;   right eye surgery     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 She has a 20 pack-year smoking history. She has never used smokeless tobacco. She reports current alcohol use. She reports that she does not use drugs.   Family History:  Her family history includes Heart disease in her paternal grandfather and paternal grandmother; Heart failure in her father; Hypertension in her mother; Intracerebral hemorrhage in her mother; Kidney disease in her maternal grandmother; Rheum arthritis in her maternal grandmother; Schizophrenia in her daughter; Stroke in her paternal grandfather and paternal grandmother.   Allergies Allergies  Allergen Reactions   Plavix [Clopidogrel] Other (See Comments)    States "I hurt so bad I couldn't get out of bed."   Other Itching    Cat Gut sutures    Oxycodone Hives   Amlodipine Hives   Atorvastatin Other (See Comments)    weakness   Augmentin [Amoxicillin-Pot  Clavulanate] Diarrhea   Azithromycin Hives   Contrast Media [Iodinated Contrast Media] Hives   Doxycycline Diarrhea   Erythromycin Diarrhea   Hydrocodone Nausea Only   Lactose Intolerance (Gi) Diarrhea    Bloating   Minocycline Diarrhea   Nsaids Other (See Comments)  Internal Bleeding   Relafen [Nabumetone] Hives   Sulfa Antibiotics Hives   Synthroid [Levothyroxine] Diarrhea   Tetracyclines & Related Diarrhea   Tilactase    Tramadol Rash    Takes +/- Benadryl     Home Medications  Prior to Admission medications   Medication Sig Start Date End Date Taking? Authorizing Provider  albuterol (VENTOLIN HFA) 108 (90 Base) MCG/ACT inhaler Inhale 2 puffs into the lungs every 6 (six) hours as needed for wheezing or shortness of breath. 06/08/23   Johnson, Megan P, DO  aspirin EC 81 MG tablet Take 1 tablet (81 mg total) by mouth daily at 6 (six) AM. Swallow whole. 06/08/23   Johnson, Megan P, DO  atenolol (TENORMIN) 25 MG tablet Take 1 tablet (25 mg total) by mouth daily. 06/08/23   Johnson, Megan P, DO  B-COMPLEX-C PO Take 1 tablet by mouth daily.    [provider]  benazepril (LOTENSIN) 10 MG tablet Take 1 tablet (10 mg total) by mouth daily. 06/08/23   Olevia Perches P, DO  BRILINTA 90 MG TABS tablet TAKE 1 TABLET TWICE A DAY 05/17/23   Georgiana Spinner, NP  Collagen-Vitamin C-Biotin (COLLAGEN 1500/C) 500-50-0.8 MG CAPS Take by mouth.    [provider]  cyclobenzaprine (FLEXERIL) 5 MG tablet Take 1 tablet (5 mg total) by mouth 3 (three) times daily as needed for muscle spasms. 06/08/23   Johnson, Megan P, DO  diphenhydrAMINE (BENADRYL) 25 MG tablet Take 25 mg by mouth every 6 (six) hours as needed.     [provider]  fluticasone (FLONASE) 50 MCG/ACT nasal spray 2 sprays into each nostril in the morning. Up to three sprays. 11/18/20   [provider]  Fluticasone-Umeclidin-Vilant (TRELEGY ELLIPTA) 100-62.5-25 MCG/ACT AEPB Inhale 1 puff into the lungs  daily for 1 day. 06/08/23   Johnson, Megan P, DO  gabapentin (NEURONTIN) 300 MG capsule Take 1 capsule (300 mg total) by mouth 2 (two) times daily. 06/08/23   Johnson, Megan P, DO  hydrOXYzine (ATARAX) 25 MG tablet Take 1 tablet (25 mg total) by mouth 3 (three) times daily as needed. 06/08/23   Johnson, Megan P, DO  lidocaine (LIDODERM) 5 % Place 1 patch onto the skin daily. Remove & Discard patch within 12 hours or as directed by MD 11/26/21   Edward Jolly, MD  Misc Natural Products (JOINT HEALTH PO) Take by mouth daily.    [provider]  montelukast (SINGULAIR) 10 MG tablet Take 1 tablet (10 mg total) by mouth at bedtime. 06/08/23   Olevia Perches P, DO  Multiple Vitamins-Minerals (MULTIVITAMIN WITH MINERALS) tablet Take 1 tablet by mouth daily.    [provider]  NIACINAMIDE PO Take 1 capsule by mouth daily.    [provider]  nitroGLYCERIN (NITROSTAT) 0.4 MG SL tablet Place 1 tablet (0.4 mg total) under the tongue every 5 (five) minutes as needed for chest pain. 05/02/23   Antonieta Iba, MD  omeprazole (PRILOSEC) 20 MG capsule Take 1 capsule (20 mg total) by mouth daily. 06/08/23   Johnson, Megan P, DO  ondansetron (ZOFRAN-ODT) 4 MG disintegrating tablet Take 1 tablet (4 mg total) by mouth every 8 (eight) hours as needed for nausea or vomiting. 10/27/22   Becky Augusta, NP  oxyCODONE (ROXICODONE) 5 MG immediate release tablet Take 1 tablet (5 mg total) by mouth every 8 (eight) hours as needed. 06/12/23 06/11/24  Chesley Noon, MD  rosuvastatin (CRESTOR) 5 MG tablet Take 1 tablet (5 mg  total) by mouth daily. 06/08/23   Johnson, Megan P, DO  thyroid (ARMOUR) 130 MG tablet Take 1 tablet (130 mg total) by mouth every other day. 06/09/23   Dorcas Carrow, DO     Critical care time: 50 minutes    Raechel Chute, MD [facility]  6:45 PM

## 2023-06-16 NOTE — Consult Note (Signed)
PHARMACY - ANTICOAGULATION CONSULT NOTE  Pharmacy Consult for heparin infusion Indication: pulmonary embolus  Allergies  Allergen Reactions   Plavix [Clopidogrel] Other (See Comments)    States "I hurt so bad I couldn't get out of bed."   Other Itching    Cat Gut sutures    Oxycodone Hives   Amlodipine Hives   Atorvastatin Other (See Comments)    weakness   Augmentin [Amoxicillin-Pot Clavulanate] Diarrhea   Azithromycin Hives   Contrast Media [Iodinated Contrast Media] Hives   Doxycycline Diarrhea   Erythromycin Diarrhea   Hydrocodone Nausea Only   Lactose Intolerance (Gi) Diarrhea    Bloating   Minocycline Diarrhea   Nsaids Other (See Comments)    Internal Bleeding   Relafen [Nabumetone] Hives   Sulfa Antibiotics Hives   Synthroid [Levothyroxine] Diarrhea   Tetracyclines & Related Diarrhea   Tilactase    Tramadol Rash    Takes +/- Benadryl   Patient Measurements: Height: 5\' 3"  (160 cm) Weight: 52.4 kg (115 lb 8.3 oz) IBW/kg (Calculated) : 52.4 Heparin Dosing Weight: 52.2 kg  Vital Signs: Temp: 98.6 F (37 C) (11/01 1000) Temp Source: Bladder (11/01 0800) BP: 108/56 (11/01 1000) Pulse Rate: 113 (11/01 1000)  Labs: Recent Labs    06/12/2023 1353 05/19/2023 1547 06/06/2023 1547 06/06/2023 2158 06/15/23 0030 06/15/23 0227 06/15/23 0516 06/15/23 0744 06/15/23 1444 06/15/23 1752 06/15/23 1753  0020  0345  0845  HGB 13.9 12.1  --   --   --   --  9.9*  --   --   --  10.9* 10.2*  --   --   HCT 41.3 36.5  --   --   --   --  30.1*  --   --   --  33.0* 30.2*  --   --   PLT 438* 347  --   --   --   --  425*  --   --   --   --  392  --   --   APTT  --   --   --  26  --   --   --   --   --   --   --   --   --   --   LABPROT  --  17.8*  --   --   --   --   --   --   --   --   --   --   --   --   INR  --  1.4*  --   --   --   --   --   --   --   --   --   --   --   --   HEPARINUNFRC  --   --    < >  --   --   --  0.48  --  0.29*  --   --  0.54  --   0.45  CREATININE 1.16*  --   --   --   --   --  1.12*  --   --   --   --   --  1.70*  --   TROPONINIHS  --   --   --  150*   < > 365*  --  414*  --  199*  --   --   --   --    < > = values in this interval not displayed.  Estimated Creatinine Clearance: 21.1 mL/min (A) (by C-G formula based on SCr of 1.7 mg/dL (H)).  Medical History: Past Medical History:  Diagnosis Date   Abdominal distension 03/18/2012   Abnormal weight loss 10/30/2017   Acute kidney injury (HCC) 03/22/2012   Aneurysm (HCC)    right eye   Anxiety    Blind right eye    Cardiac arrest [facility])    Cataract    right eye   Claudication (HCC)    Complication of anesthesia 03/15/2012   "crashed" during first part of 3-part back surgery.    COPD (chronic obstructive pulmonary disease) (HCC)    Delirium 03/18/2012   Dyspnea    GERD (gastroesophageal reflux disease)    Heart murmur    Hypertension    Leaky heart valve    Oxygen deficit    uses O2 at night 2 liters   Peripheral vascular disease (HCC)    Scoliosis    Sepsis (HCC)    Shock (HCC) 03/23/2012   SIRS (systemic inflammatory response syndrome) (HCC) 03/23/2012   Spinal stenosis    Stroke (HCC)    Eye   Tachycardia    Medications:  No home anticoagulants per pharmacist review  Assessment: 82 yo female presented to the ED due to pain in lower back and legs.  Patient was scheduled for bilateral femoral vascular procedures on 10/31.  Imaging today reveal PE.  Pharmacy consulted to start heparin infusion.  Goal of Therapy:  Heparin level 0.3-0.7 units/ml Monitor platelets by anticoagulation protocol: Yes  10/31 0516 HL 0.48, therapeutic x 1 10/31 1444 HL 0.29; subtherapeutic 11/01 0020 HL 0.54, therapeutic x 1 11/01 0845 HL 0.45, therapeutic x 2    Plan:  HL therapeutic x 2 Continue heparin infusion at 950 units/hr  Check next HL with AM labs CBC daily while on heparin  [name], PharmD Pharmacy Resident   11:19 AM

## 2023-06-16 NOTE — Consult Note (Signed)
Consultation Note Date:    Patient Name: Savannah Mcclure  DOB: [birth year]  MRN: [id number]  Age / Sex: 82 y.o., female  PCP: Dorcas Carrow, DO Referring Physician: Raechel Chute, MD  Reason for Consultation: Establishing goals of care   HPI/Brief Hospital Course: 82 y.o. female  with past medical history of COPD, hypertension, PAD, CAD admitted from home on 05/21/2023 with severe bilateral lower extremity and low back pain as well as shortness of breath and productive cough.  Reportedly patient was scheduled for bilateral femoral endarterectomies on 10/31 after being followed by outpatient vascular surgery for severe PAD.  Reportedly according to daughter pain has been severe and uncontrolled at home.  On arrival to ED Savannah Mcclure was found to be tachycardic, tachypneic and hypoxic.  Underwent CT angio found to have PE without associated right heart strain as well as multifocal pneumonia.  After CTA Savannah Mcclure developed significant respiratory distress requiring emergent intubation and ICU admission.  Savannah Mcclure remains on mechanical ventilation, being treated for circulatory shock secondary to sepsis requiring vasopressor support.  Remains on heparin drip for PE and antibiotic therapy for pneumonia.  Palliative medicine was consulted for assisting with goals of care conversations.  Subjective:  Extensive chart review has been completed prior to meeting patient including labs, vital signs, imaging, progress notes, orders, and available advanced directive documents from current and previous encounters.  Visited with Savannah Mcclure at her bedside, she remains sedated and intubated.  Spoke with nursing staff who shares Savannah Mcclure lives at home with daughter Savannah Mcclure who unfortunately at this time is unable to visit due to having current flu virus herself.  Savannah Mcclure also has another daughter Savannah Mcclure who lives out of state in [place].  Called and  spoke with daughter Savannah Mcclure.  Savannah Mcclure shares she has received appropriate updates from medical team and is aware her mom is being treated for PE and pneumonia and remains on mechanical ventilation.  Discussed with Savannah Mcclure goals and advanced care planning.  Savannah Mcclure shares she is not certain but does feel as though Savannah Mcclure has completed advanced directives in the past and that she has appointed her sister Savannah Mcclure as Product manager.  Savannah Mcclure then it recommends that I reach out to her sister Savannah Mcclure for verification.  Called and spoke with daughter Savannah Mcclure who does confirm she believes her mom has completed advanced directives in the past appointing her as HCPOA.  Savannah Mcclure shares she will search through her files and if she is able we will send over a copy to be uploaded into Savannah Mcclure EMR.  Introduced myself as a Publishing rights manager as a member of the palliative care team. Explained palliative medicine is specialized medical care for people living with serious illness. It focuses on providing relief from the symptoms and stress of a serious illness. The goal is to improve quality of life for both the patient and the family.   Savannah Mcclure shares and confirms that her mother does live with her sister Savannah Mcclure.  Savannah Mcclure has been speaking with the nursing staff as well as her sister and also has an understanding of her mom's current condition.  We discussed goals and Savannah Mcclure shares Savannah Mcclure has previously required mechanical ventilation postoperatively and was unable to successfully be liberalized from ventilator which resulted in having trach/PEG placed.  Savannah Mcclure shares about after a month Savannah Mcclure was successfully weaned from her trach.  Savannah Mcclure does share she understands her mom has advanced in age and that her underlying comorbid conditions  have also progressed since that time and in this present state she is unsure of what her mother's long-term wishes would be.  We also discussed CODE STATUS and the  difference between full code versus DO NOT RESUSCITATE for which Savannah Mcclure shared she is well aware of the differences.  Savannah Mcclure shares she has not had in-depth conversations with her mother in the past regarding the specific wishes but feels at this time it is most appropriate for Savannah Mcclure to remain full code.  I discussed importance of continued conversations with family/support persons and all members of their medical team regarding overall plan of care and treatment options ensuring decisions are in alignment with patients goals of care.  All questions/concerns addressed. PMT will continue to follow and support patient as needed.  Plan to set with Savannah Mcclure to assess Savannah Mcclure each day and have ongoing goals of care conversations also involving ICU providers for which she agreed.  Objective: Primary Diagnoses: Present on Admission:  Acute hypoxic respiratory failure (HCC)   Vital Signs: BP (!) 105/58   Pulse (!) 114   Temp (!) 96.6 F (35.9 C)   Resp (!) 28   Ht 5\' 3"  (1.6 m)   Wt 52.4 kg   SpO2 100%   BMI 20.46 kg/m  Pain Scale: CPOT   Pain Score: 0-No pain   IO: Intake/output summary:  Intake/Output Summary (Last 24 hours) at  1650 Last data filed at  1400 Gross per 24 hour  Intake 3066.33 ml  Output 400 ml  Net 2666.33 ml    LBM: Last BM Date :  (PTA) Baseline Weight: Weight: 52.2 kg Most recent weight: Weight: 52.4 kg      Assessment and Plan  SUMMARY OF RECOMMENDATIONS   Full Code-Full Scope Ongoing GOC needed PMT will continue to follow for ongoing needs and support  Thank you for this consult and allowing Palliative Medicine to participate in the care of Savannah A. Miles-Reichert. Palliative medicine will continue to follow and assist as needed.   Time Total: 75 minutes  Time spent includes: Detailed review of medical records (labs, imaging, vital signs), medically appropriate exam (mental status, respiratory, cardiac, skin), discussed  with treatment team, counseling and educating patient, family and staff, documenting clinical information, medication management and coordination of care.   Signed by: Leeanne Deed, DNP, AGNP-C Palliative Medicine    Please contact Palliative Medicine Team phone at [phone] for questions and concerns.  For individual provider: See Loretha Stapler

## 2023-06-16 NOTE — Consult Note (Signed)
Pharmacy Antibiotic Note  Savannah Mcclure is a 82 y.o. female admitted on 06/05/2023 with acute respiratory failure. PMH significant for COPD, PAD, HLD, CAD, CVA, hypothyroidism, & anxiety/depression. Pharmacy has been consulted for Zosyn dosing for pneumonia.  Today, 06/16/1023 Day 2 of Zosyn  WBC uptrending; 33.9 today (receiving steroids)  Afebrile  PCT 6.67 today  10/30 Blood cultures negative to date  10/31 Trach aspirate culture pending   Plan: Continue Zosyn 3.375g IV q8h (4 hour infusion) Also on doxycycline 100 mg IV Q12H Follow renal function and cultures for adjustments  Height: 5\' 3"  (160 cm) Weight: 52.4 kg (115 lb 8.3 oz) IBW/kg (Calculated) : 52.4  Temp (24hrs), Avg:98.3 F (36.8 C), Min:97.2 F (36.2 C), Max:99.3 F (37.4 C)  Recent Labs  Lab 06/12/23 1210 05/25/2023 1353 05/27/2023 1547 06/07/2023 1759 05/26/2023 2158 06/15/23 0030 06/15/23 0516  0020  0345  WBC 21.2* 19.0* 14.2*  --   --   --  31.4* 33.9*  --   CREATININE 1.08* 1.16*  --   --   --   --  1.12*  --  1.70*  LATICACIDVEN  --  3.6* 2.2* 1.7 2.9* 1.6  --   --   --     Estimated Creatinine Clearance: 21.1 mL/min (A) (by C-G formula based on SCr of 1.7 mg/dL (H)).    Allergies  Allergen Reactions   Plavix [Clopidogrel] Other (See Comments)    States "I hurt so bad I couldn't get out of bed."   Other Itching    Cat Gut sutures    Oxycodone Hives   Amlodipine Hives   Atorvastatin Other (See Comments)    weakness   Augmentin [Amoxicillin-Pot Clavulanate] Diarrhea   Azithromycin Hives   Contrast Media [Iodinated Contrast Media] Hives   Doxycycline Diarrhea   Erythromycin Diarrhea   Hydrocodone Nausea Only   Lactose Intolerance (Gi) Diarrhea    Bloating   Minocycline Diarrhea   Nsaids Other (See Comments)    Internal Bleeding   Relafen [Nabumetone] Hives   Sulfa Antibiotics Hives   Synthroid [Levothyroxine] Diarrhea   Tetracyclines & Related Diarrhea   Tilactase     Tramadol Rash    Takes +/- Benadryl   Antimicrobials this admission: Vancomycin 10/30 x 1 Levofloxacin 10/30 x 1 Zosyn 10/30 >>  Doxycycline 10/31 >>  Dose adjustments this admission: N/A  Microbiology results: 10/30 BCx: NGTD 10/30 MRSA PCR: not detected 10/31 Trach aspirate culture: pending   Thank you for allowing pharmacy to be a part of this patient's care.  [name], PharmD Pharmacy Resident   11:24 AM

## 2023-06-16 DEATH — deceased

## 2023-06-17 ENCOUNTER — Inpatient Hospital Stay: Payer: Medicare Other

## 2023-06-17 DIAGNOSIS — J9601 Acute respiratory failure with hypoxia: Secondary | ICD-10-CM | POA: Diagnosis not present

## 2023-06-17 DIAGNOSIS — N179 Acute kidney failure, unspecified: Secondary | ICD-10-CM | POA: Diagnosis not present

## 2023-06-17 DIAGNOSIS — G928 Other toxic encephalopathy: Secondary | ICD-10-CM | POA: Diagnosis not present

## 2023-06-17 DIAGNOSIS — J189 Pneumonia, unspecified organism: Secondary | ICD-10-CM | POA: Diagnosis not present

## 2023-06-17 LAB — BASIC METABOLIC PANEL
Anion gap: 13 (ref 5–15)
BUN: 63 mg/dL — ABNORMAL HIGH (ref 8–23)
CO2: 18 mmol/L — ABNORMAL LOW (ref 22–32)
Calcium: 7.4 mg/dL — ABNORMAL LOW (ref 8.9–10.3)
Chloride: 96 mmol/L — ABNORMAL LOW (ref 98–111)
Creatinine, Ser: 2.66 mg/dL — ABNORMAL HIGH (ref 0.44–1.00)
GFR, Estimated: 17 mL/min — ABNORMAL LOW (ref >60)
Glucose, Bld: 200 mg/dL — ABNORMAL HIGH (ref 70–99)
Potassium: 4.5 mmol/L (ref 3.5–5.1)
Sodium: 128 mmol/L — ABNORMAL LOW (ref 135–145)

## 2023-06-17 LAB — PHOSPHORUS: Phosphorus: 4.9 mg/dL — ABNORMAL HIGH (ref 2.5–4.6)

## 2023-06-17 LAB — BLOOD GAS, ARTERIAL
Acid-base deficit: 13.3 mmol/L — ABNORMAL HIGH (ref 0.0–2.0)
Bicarbonate: 16.5 mmol/L — ABNORMAL LOW (ref 20.0–28.0)
FIO2: 40 %
O2 Saturation: 81.8 %
PEEP: 5 cmH2O
Patient temperature: 37
Pressure support: 5 cmH2O
pCO2 arterial: 53 mm[Hg] — ABNORMAL HIGH (ref 32–48)
pH, Arterial: 7.1 — CL (ref 7.35–7.45)
pO2, Arterial: 51 mm[Hg] — ABNORMAL LOW (ref 83–108)

## 2023-06-17 LAB — GLUCOSE, CAPILLARY
Glucose-Capillary: 205 mg/dL — ABNORMAL HIGH (ref 70–99)
Glucose-Capillary: 166 mg/dL — ABNORMAL HIGH (ref 70–99)
Glucose-Capillary: 247 mg/dL — ABNORMAL HIGH (ref 70–99)
Glucose-Capillary: 215 mg/dL — ABNORMAL HIGH (ref 70–99)
Glucose-Capillary: 186 mg/dL — ABNORMAL HIGH (ref 70–99)

## 2023-06-17 LAB — CBC
HCT: 27.7 % — ABNORMAL LOW (ref 36.0–46.0)
Hemoglobin: 9.2 g/dL — ABNORMAL LOW (ref 12.0–15.0)
MCH: 31 pg (ref 26.0–34.0)
MCHC: 33.2 g/dL (ref 30.0–36.0)
MCV: 93.3 fL (ref 80.0–100.0)
Platelets: 360 10*3/uL (ref 150–400)
RBC: 2.97 MIL/uL — ABNORMAL LOW (ref 3.87–5.11)
RDW: 15.2 % (ref 11.5–15.5)
WBC: 42.2 10*3/uL — ABNORMAL HIGH (ref 4.0–10.5)
nRBC: 0.3 % — ABNORMAL HIGH (ref 0.0–0.2)

## 2023-06-17 LAB — MAGNESIUM: Magnesium: 2 mg/dL (ref 1.7–2.4)

## 2023-06-17 LAB — HEPARIN LEVEL (UNFRACTIONATED): Heparin Unfractionated: 0.47 [IU]/mL (ref 0.30–0.70)

## 2023-06-17 MED ORDER — SODIUM CHLORIDE 0.9 % IV SOLN
1000.0000 mg | Freq: Two times a day (BID) | INTRAVENOUS | Status: DC
  Administered 2023-06-17 (×2): 1000 mg via INTRAVENOUS
  Filled 2023-06-17 (×2): qty 20

## 2023-06-17 MED ORDER — POLYETHYLENE GLYCOL 3350 17 G PO PACK: 17.0000 g | PACK | Freq: Every day | ORAL | Status: DC | PRN

## 2023-06-17 MED ORDER — ASPIRIN 81 MG PO CHEW
81.0000 mg | CHEWABLE_TABLET | Freq: Every day | ORAL | Status: DC
  Administered 2023-06-17: 81 mg
  Filled 2023-06-17: qty 1

## 2023-06-17 MED ORDER — ACETAMINOPHEN 325 MG PO TABS
975.0000 mg | ORAL_TABLET | Freq: Four times a day (QID) | ORAL | Status: DC | PRN
  Administered 2023-06-17: 975 mg
  Filled 2023-06-17: qty 3

## 2023-06-17 MED ORDER — DOCUSATE SODIUM 50 MG/5ML PO LIQD: 100.0000 mg | Freq: Two times a day (BID) | ORAL | Status: DC | PRN

## 2023-06-17 MED ORDER — INSULIN ASPART 100 UNIT/ML IJ SOLN
0.0000 [IU] | INTRAMUSCULAR | Status: DC
  Administered 2023-06-17: 4 [IU] via SUBCUTANEOUS
  Administered 2023-06-17: 7 [IU] via SUBCUTANEOUS
  Filled 2023-06-17 (×2): qty 1

## 2023-06-17 MED ORDER — NOREPINEPHRINE 16 MG/250ML-% IV SOLN
0.0000 ug/min | INTRAVENOUS | Status: DC
  Administered 2023-06-17: 13 ug/min via INTRAVENOUS
  Filled 2023-06-17: qty 250

## 2023-06-17 NOTE — Progress Notes (Signed)
Received email from daughter-Jennifer that she is catching a flight in from [place] today and will arrive later this evening. Plan set to meet with [name] at bedside tomorrow. ICU provider and nursing staff updated.  No Charge.  Leeanne Deed, DNP, AGNP-C Palliative Medicine  Please call Palliative Medicine team phone with any questions [phone]. For individual providers please see AMION.

## 2023-06-17 NOTE — Progress Notes (Signed)
[facility] CONSULT NOTE    Date: 06/17/2023                  Patient Name:  Savannah Mcclure  MRN: [id number]  DOB: 27-Nov-1940  Age / Sex: 82 y.o., female         PCP: Dorcas Carrow, DO                 Service Requesting Consult:                  Reason for Consult:             History of Present Illness: Patient is a 82 y.o. female with a PMHx of hypertension, coronary artery disease, COPD, PAD, chronic pain syndrome, admitted initially with history of leg pain.  Subsequently patient developed hypoxic respiratory failure needing to be placed on ventilation.  He is also being treated for multifocal pneumonia.  Patient also had CT angiogram of the chest on the 30th.  Medications: Outpatient medications: Facility-Administered Medications Prior to Admission  Medication Dose Route Frequency Provider Last Rate Last Admin   albuterol (PROVENTIL) (2.5 MG/3ML) 0.083% nebulizer solution 2.5 mg  2.5 mg Nebulization Once Johnson, Megan P, DO       Medications Prior to Admission  Medication Sig Dispense Refill Last Dose   albuterol (VENTOLIN HFA) 108 (90 Base) MCG/ACT inhaler Inhale 2 puffs into the lungs every 6 (six) hours as needed for wheezing or shortness of breath. 8 g 6 prn at unknown   aspirin EC 81 MG tablet Take 1 tablet (81 mg total) by mouth daily at 6 (six) AM. Swallow whole. 30 tablet 12 Past Week   atenolol (TENORMIN) 25 MG tablet Take 1 tablet (25 mg total) by mouth daily. 90 tablet 1 Past Week   B-COMPLEX-C PO Take 1 tablet by mouth daily.   Past Week   benazepril (LOTENSIN) 10 MG tablet Take 1 tablet (10 mg total) by mouth daily. 90 tablet 1 Past Week   BRILINTA 90 MG TABS tablet TAKE 1 TABLET TWICE A DAY 60 tablet 11 Past Week   Collagen-Vitamin C-Biotin (COLLAGEN 1500/C) 500-50-0.8 MG CAPS Take by mouth.   Past Week   cyclobenzaprine (FLEXERIL) 5 MG tablet Take 1 tablet (5 mg total) by mouth 3 (three) times daily as needed for muscle  spasms. 30 tablet 6 prn at unknown   diphenhydrAMINE (BENADRYL) 25 MG tablet Take 25 mg by mouth every 6 (six) hours as needed.    prn at unknown   Fluticasone-Umeclidin-Vilant (TRELEGY ELLIPTA) 100-62.5-25 MCG/ACT AEPB Inhale 1 puff into the lungs daily for 1 day. 180 each 1 Past Week   gabapentin (NEURONTIN) 300 MG capsule Take 1 capsule (300 mg total) by mouth 2 (two) times daily. 180 capsule 6 Past Week   hydrOXYzine (ATARAX) 25 MG tablet Take 1 tablet (25 mg total) by mouth 3 (three) times daily as needed. 270 tablet 3 Past Week   lidocaine (LIDODERM) 5 % Place 1 patch onto the skin daily. Remove & Discard patch within 12 hours or as directed by MD 60 patch 6 prn at unknown   Misc Natural Products (JOINT HEALTH PO) Take by mouth daily.   Past Week   montelukast (SINGULAIR) 10 MG tablet Take 1 tablet (10 mg total) by mouth at bedtime. 90 tablet 3 Past Week   Multiple Vitamins-Minerals (MULTIVITAMIN WITH MINERALS) tablet Take 1 tablet by mouth daily.   Past Week   NIACINAMIDE  PO Take 1 capsule by mouth daily.   Past Week   nitroGLYCERIN (NITROSTAT) 0.4 MG SL tablet Place 1 tablet (0.4 mg total) under the tongue every 5 (five) minutes as needed for chest pain. 25 tablet 3 prn   omeprazole (PRILOSEC) 20 MG capsule Take 1 capsule (20 mg total) by mouth daily. 90 capsule 1 Past Week   rosuvastatin (CRESTOR) 5 MG tablet Take 1 tablet (5 mg total) by mouth daily. 90 tablet 1 Past Week   thyroid (ARMOUR) 90 MG tablet Take 90 mg by mouth every other day.   Past Week   fluticasone (FLONASE) 50 MCG/ACT nasal spray 2 sprays into each nostril in the morning. Up to three sprays. (Patient not taking: Reported on )   Not Taking   ondansetron (ZOFRAN-ODT) 4 MG disintegrating tablet Take 1 tablet (4 mg total) by mouth every 8 (eight) hours as needed for nausea or vomiting. (Patient not taking: Reported on ) 20 tablet 0 Not Taking   oxyCODONE (ROXICODONE) 5 MG immediate release tablet Take 1  tablet (5 mg total) by mouth every 8 (eight) hours as needed. (Patient not taking: Reported on ) 12 tablet 0 Not Taking    Discontinued Meds:   Medications Discontinued During This Encounter  Medication Reason   sodium chloride 0.9 % bolus 1,000 mL    famotidine (PEPCID) tablet 20 mg    midazolam (VERSED) injection 1-2 mg    budesonide (PULMICORT) nebulizer solution 0.25 mg    methylPREDNISolone sodium succinate (SOLU-MEDROL) 40 mg/mL injection 20 mg    hydrocortisone sodium succinate (SOLU-CORTEF) 100 MG injection 50 mg    vancomycin (VANCOREADY) IVPB 500 mg/100 mL    hydrocortisone sodium succinate (SOLU-CORTEF) 100 MG injection 100 mg    hydrocortisone sodium succinate (SOLU-CORTEF) 100 MG injection 100 mg    etomidate (AMIDATE) injection 15 mg Completed Course   fentaNYL (SUBLIMAZE) injection 25 mcg Expired Prescription   succinylcholine (ANECTINE) syringe 55 mg Completed Course   oxyCODONE (Oxy IR/ROXICODONE) immediate release tablet 5 mg    hydrOXYzine (ATARAX) tablet 25 mg    thyroid (ARMOUR) 130 MG tablet Dose change   montelukast (SINGULAIR) tablet 10 mg    docusate sodium (COLACE) capsule 100 mg    aspirin EC tablet 81 mg    polyethylene glycol (MIRALAX / GLYCOLAX) packet 17 g    piperacillin-tazobactam (ZOSYN) IVPB 3.375 g    insulin aspart (novoLOG) injection 0-15 Units     Current medications: Current Facility-Administered Medications  Medication Dose Route Frequency Provider Last Rate Last Admin   acetaminophen (TYLENOL) tablet 975 mg  975 mg Per Tube Q6H PRN Raechel Chute, MD   975 mg at 06/17/23 0916   aspirin chewable tablet 81 mg  81 mg Per Tube Daily Mila Merry A, RPH   81 mg at 06/17/23 1028   Chlorhexidine Gluconate Cloth 2 % PADS 6 each  6 each Topical Daily Raechel Chute, MD   6 each at  0352   dexmedetomidine (PRECEDEX) 400 MCG/100ML (4 mcg/mL) infusion  0-1.2 mcg/kg/hr Intravenous Titrated Raechel Chute, MD 9.17 mL/hr at 06/17/23 1236  0.7 mcg/kg/hr at 06/17/23 1236   docusate (COLACE) 50 MG/5ML liquid 100 mg  100 mg Per Tube BID Rust-Chester, Micheline Rough L, NP   100 mg at 06/17/23 0912   docusate (COLACE) 50 MG/5ML liquid 100 mg  100 mg Per Tube BID PRN Mila Merry A, RPH       doxycycline (VIBRAMYCIN) 100 mg in dextrose 5 % 250 mL  IVPB  100 mg Intravenous Q12H Raechel Chute, MD   Stopped at 06/17/23 1121   feeding supplement (VITAL AF 1.2 CAL) liquid 1,000 mL  1,000 mL Per Tube Continuous Raechel Chute, MD 50 mL/hr at 06/17/23 1236 Infusion Verify at 06/17/23 1236   fentaNYL (SUBLIMAZE) injection 25-100 mcg  25-100 mcg Intravenous Q30 min PRN Rust-Chester, Cecelia Byars, NP   50 mcg at 06/17/23 1232   fentaNYL in NS (75mcg/ml) infusion-PREMIX  0-200 mcg/hr Intravenous Continuous Rust-Chester, Britton L, NP 17.5 mL/hr at 06/17/23 1236 175 mcg/hr at 06/17/23 1236   free water 30 mL  30 mL Per Tube Q4H Dgayli, Khabib, MD   30 mL at 06/17/23 1043   heparin ADULT infusion 100 units/mL (25000 units/2101mL)  950 Units/hr Intravenous Continuous Meegan, Eryn, RPH 9.5 mL/hr at 06/17/23 1236 950 Units/hr at 06/17/23 1236   hydrocortisone sodium succinate (SOLU-CORTEF) 100 MG injection 50 mg  50 mg Intravenous Q6H Dgayli, Khabib, MD   50 mg at 06/17/23 0911   insulin aspart (novoLOG) injection 0-20 Units  0-20 Units Subcutaneous Q4H Dgayli, Khabib, MD       ipratropium-albuterol (DUONEB) 0.5-2.5 (3) MG/3ML nebulizer solution 3 mL  3 mL Nebulization Q6H Rust-Chester, Britton L, NP   3 mL at 06/17/23 1257   lidocaine (LIDODERM) 5 % 1 patch  1 patch Transdermal Q24H Rust-Chester, Britton L, NP   1 patch at 06/17/23 0301   meropenem (MERREM) 1,000 mg in sodium chloride 0.9 % 100 mL IVPB  1,000 mg Intravenous Q12H Dgayli, Lianne Bushy, MD 200 mL/hr at 06/17/23 1236 Infusion Verify at 06/17/23 1236   norepinephrine (LEVOPHED) 4mg  in (0.016 mg/mL) premix infusion  2-10 mcg/min Intravenous Titrated [name], RPH   Stopped at 06/17/23 0855    Oral care mouth rinse  15 mL Mouth Rinse Q2H Dgayli, Khabib, MD   15 mL at 06/17/23 1227   Oral care mouth rinse  15 mL Mouth Rinse PRN Raechel Chute, MD       pantoprazole (PROTONIX) injection 40 mg  40 mg Intravenous QHS Dgayli, Lianne Bushy, MD   40 mg at  2149   polyethylene glycol (MIRALAX / GLYCOLAX) packet 17 g  17 g Per Tube Daily Rust-Chester, Britton L, NP   17 g at 06/17/23 0918   polyethylene glycol (MIRALAX / GLYCOLAX) packet 17 g  17 g Per Tube Daily PRN Ardis Rowan, Walid A, RPH       propofol (DIPRIVAN) 1000 MG/100ML infusion  5-50 mcg/kg/min Intravenous Titrated Meegan, Eryn, RPH   Stopped at 06/17/23 1140   sodium chloride flush (NS) 0.9 % injection 10 mL  10 mL Intravenous Q12H Willy Eddy, MD   10 mL at 06/17/23 0919   sodium chloride flush (NS) 0.9 % injection 10-40 mL  10-40 mL Intracatheter Q12H Dgayli, Khabib, MD   10 mL at 06/17/23 0918   sodium chloride flush (NS) 0.9 % injection 10-40 mL  10-40 mL Intracatheter PRN Raechel Chute, MD          Allergies: Allergies  Allergen Reactions   Plavix [Clopidogrel] Other (See Comments)    States "I hurt so bad I couldn't get out of bed."   Other Itching    Cat Gut sutures    Oxycodone Hives   Amlodipine Hives   Atorvastatin Other (See Comments)    weakness   Augmentin [Amoxicillin-Pot Clavulanate] Diarrhea   Azithromycin Hives   Contrast Media [Iodinated Contrast Media] Hives   Doxycycline Diarrhea   Erythromycin Diarrhea  Hydrocodone Nausea Only   Lactose Intolerance (Gi) Diarrhea    Bloating   Minocycline Diarrhea   Nsaids Other (See Comments)    Internal Bleeding   Relafen [Nabumetone] Hives   Sulfa Antibiotics Hives   Synthroid [Levothyroxine] Diarrhea   Tetracyclines & Related Diarrhea   Tilactase    Tramadol Rash    Takes +/- Benadryl      Past Medical History: Past Medical History:  Diagnosis Date   Abdominal distension 03/18/2012   Abnormal weight loss 10/30/2017   Acute kidney injury  (HCC) 03/22/2012   Aneurysm (HCC)    right eye   Anxiety    Blind right eye    Cardiac arrest [facility])    Cataract    right eye   Claudication (HCC)    Complication of anesthesia 03/15/2012   "crashed" during first part of 3-part back surgery.    COPD (chronic obstructive pulmonary disease) (HCC)    Delirium 03/18/2012   Dyspnea    GERD (gastroesophageal reflux disease)    Heart murmur    Hypertension    Leaky heart valve    Oxygen deficit    uses O2 at night 2 liters   Peripheral vascular disease (HCC)    Scoliosis    Sepsis (HCC)    Shock (HCC) 03/23/2012   SIRS (systemic inflammatory response syndrome) (HCC) 03/23/2012   Spinal stenosis    Stroke [facility])    Eye   Tachycardia      Past Surgical History: Past Surgical History:  Procedure Laterality Date   BACK SURGERY     BREAST LUMPECTOMY Left    CAROTID PTA/STENT INTERVENTION Right 09/19/2022   Procedure: CAROTID PTA/STENT INTERVENTION;  Surgeon: Annice Needy, MD;  Location: [facility] INVASIVE CV LAB;  Service: Cardiovascular;  Laterality: Right;   CATARACT EXTRACTION W/PHACO Left 08/14/2019   Procedure: CATARACT EXTRACTION PHACO AND INTRAOCULAR LENS PLACEMENT (IOC) LEFT 10.22 00:59.7 42.4%;  Surgeon: Lockie Mola, MD;  Location: [facility];  Service: Ophthalmology;  Laterality: Left;  leave it last patient per [place]   LOWER EXTREMITY ANGIOGRAPHY Right 06/20/2018   Procedure: LOWER EXTREMITY ANGIOGRAPHY;  Surgeon: Annice Needy, MD;  Location: [facility] INVASIVE CV LAB;  Service: Cardiovascular;  Laterality: Right;   LOWER EXTREMITY ANGIOGRAPHY Left 02/03/2020   Procedure: LOWER EXTREMITY ANGIOGRAPHY;  Surgeon: Annice Needy, MD;  Location: [facility] INVASIVE CV LAB;  Service: Cardiovascular;  Laterality: Left;   LOWER EXTREMITY ANGIOGRAPHY Right 02/20/2023   Procedure: Lower Extremity Angiography;  Surgeon: Annice Needy, MD;  Location: [facility] INVASIVE CV LAB;  Service: Cardiovascular;  Laterality: Right;   right eye surgery      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   TOTAL ABDOMINAL HYSTERECTOMY     total   WISDOM TOOTH EXTRACTION       Family History: Family History  Problem Relation Age of Onset   Intracerebral hemorrhage Mother    Hypertension Mother    Heart failure Father    Schizophrenia Daughter    Rheum arthritis Maternal Grandmother    Kidney disease Maternal Grandmother    Heart disease Paternal Grandmother    Stroke Paternal Grandmother    Stroke Paternal Grandfather    Heart disease Paternal Grandfather      Social History: Social History   Socioeconomic History   Marital status: Widowed    Spouse name: Not on file   Number of children: 2   Years of education: college    Highest education level: Not on file  Occupational History   Occupation: retired  Tobacco Use   Smoking status: Every Day    Current packs/day: 0.50    Average packs/day: 0.5 packs/day for 40.0 years (20.0 ttl pk-yrs)    Types: Cigarettes   Smokeless tobacco: Never   Tobacco comments:    0.5PPD   Vaping Use   Vaping status: Former  Substance and Sexual Activity   Alcohol use: Yes    Comment: wine on occasion (1x/month)   Drug use: No   Sexual activity: Not Currently  Other Topics Concern   Not on file  Social History Narrative   Lives with Barry(son-in-law), [name] Daughter. And grandkids (19 & 13y.o.). Pets include 10 cats, 4 dogs (2 inside).   Social Determinants of Health   Financial Resource Strain: Low Risk  (10/17/2022)   Overall Financial Resource Strain (CARDIA)    Difficulty of Paying Living Expenses: Not very hard  Food Insecurity: No Food Insecurity (10/17/2022)   Hunger Vital Sign    Worried About Running Out of Food in the Last Year: Never true    Ran Out of Food in the Last Year: Never true  Transportation Needs: No Transportation Needs (10/17/2022)   PRAPARE - Administrator, Civil Service (Medical): No    Lack of Transportation (Non-Medical): No  Recent Concern: Transportation  Needs - Unmet Transportation Needs (09/30/2022)   PRAPARE - Transportation    Lack of Transportation (Medical): Yes    Lack of Transportation (Non-Medical): Yes  Physical Activity: Inactive (10/17/2022)   Exercise Vital Sign    Days of Exercise per Week: 0 days    Minutes of Exercise per Session: 0 min  Stress: No Stress Concern Present (10/17/2022)   [employer] of Occupational Health - Occupational Stress Questionnaire    Feeling of Stress : Only a little  Social Connections: Socially Isolated (10/17/2022)   Social Connection and Isolation Panel [NHANES]    Frequency of Communication with Friends and Family: Once a week    Frequency of Social Gatherings with Friends and Family: Never    Attends Religious Services: Never    Database administrator or Organizations: No    Attends Banker Meetings: Never    Marital Status: Widowed  Intimate Partner Violence: Not At Risk (10/17/2022)   Humiliation, Afraid, Rape, and Kick questionnaire    Fear of Current or Ex-Partner: No    Emotionally Abused: No    Physically Abused: No    Sexually Abused: No     Review of Systems: As per HPI  Vital Signs: Blood pressure 115/63, pulse (!) 105, temperature 99.5 F (37.5 C), temperature source Oral, resp. rate (!) 28, height 5\' 3"  (1.6 m), weight 52.6 kg, SpO2 97%.  Weight trends: Filed Weights   06/12/2023 1329  0321 06/17/23 0411  Weight: 52.2 kg 52.4 kg 52.6 kg    Physical Exam: Physical Exam: General:  No acute distress  Head:  Normocephalic, atraumatic. Moist oral mucosal membranes  Eyes:  Anicteric  Neck:  Supple  Lungs:   Clear to auscultation, normal effort  Heart:  S1S2 no rubs  Abdomen:   Soft, nontender, bowel sounds present  Extremities:  peripheral edema.  Neurologic: Vented and sedated now.    Skin:  No lesions  Access:     Lab results:  Basic Metabolic Panel: Recent Labs  Lab 06/12/23 1210 06/10/2023 1353 05/26/2023 2158 06/15/23 0516  06/15/23 1444  0345 06/17/23 0400  NA 136 137  --  138  --  134* 128*  K 3.4* 3.6  --  3.2* 3.0* 4.6 4.5  CL 99 99  --  104  --  100 96*  CO2 23 20*  --  22  --  20* 18*  GLUCOSE 167* 181*  --  225*  --  167* 200*  BUN 18 36*  --  37*  --  46* 63*  CREATININE 1.08* 1.16*  --  1.12*  --  1.70* 2.66*  CALCIUM 9.0 8.8*  --  7.9*  --  7.8* 7.4*  MG  --   --  1.4* 2.4  --  2.2 2.0  PHOS  --   --  3.5 4.2  --  4.0 4.9*    Creatinine  Date/Time Value Ref Range Status  09/11/2012 04:01 AM 0.86 0.60 - 1.30 mg/dL Final  52/84/1324 40:10 PM 0.91 0.60 - 1.30 mg/dL Final   Creatinine, Ser  Date/Time Value Ref Range Status  06/17/2023 04:00 AM 2.66 (H) 0.44 - 1.00 mg/dL Final  27/25/3664 40:34 AM 1.70 (H) 0.44 - 1.00 mg/dL Final  74/25/9563 87:56 AM 1.12 (H) 0.44 - 1.00 mg/dL Final  43/32/9518 84:16 PM 1.16 (H) 0.44 - 1.00 mg/dL Final  60/63/0160 10:93 PM 1.08 (H) 0.44 - 1.00 mg/dL Final  23/55/7322 02:54 PM 0.80 0.57 - 1.00 mg/dL Final  27/01/2375 28:31 PM 0.87 0.44 - 1.00 mg/dL Final  51/76/1607 37:10 AM 0.87 0.44 - 1.00 mg/dL Final  62/69/4854 62:70 PM 0.67 0.57 - 1.00 mg/dL Final  35/00/9381 82:99 AM 1.13 (H) 0.57 - 1.00 mg/dL Final  37/16/9678 93:81 AM 0.78 0.44 - 1.00 mg/dL Final  01/75/1025 85:27 AM 0.81 0.44 - 1.00 mg/dL Final  78/24/2353 61:44 PM 0.80 0.44 - 1.00 mg/dL Final  31/54/0086 76:19 PM 0.94 0.57 - 1.00 mg/dL Final  50/93/2671 24:58 PM 0.72 0.57 - 1.00 mg/dL Final  09/98/3382 50:53 PM 0.91 0.57 - 1.00 mg/dL Final  97/67/3419 37:90 AM 0.52 0.44 - 1.00 mg/dL Final  24/04/7352 29:92 AM 0.52 0.44 - 1.00 mg/dL Final  42/68/3419 62:22 PM 0.67 0.44 - 1.00 mg/dL Final  97/98/9211 94:17 PM 0.76 0.57 - 1.00 mg/dL Final  40/81/4481 85:63 PM 0.61 0.57 - 1.00 mg/dL Final  14/97/0263 78:58 AM 0.83 0.57 - 1.00 mg/dL Final  85/09/7739 28:78 AM 0.84 0.57 - 1.00 mg/dL Final  67/67/2094 70:96 PM 0.73 0.57 - 1.00 mg/dL Final  28/36/6294 76:54 AM 0.74 0.57 - 1.00 mg/dL Final   65/10/5463 68:12 PM 0.75 0.57 - 1.00 mg/dL Final  75/17/0017 49:44 PM 0.90 0.57 - 1.00 mg/dL Final  96/75/9163 84:66 AM 0.76 0.44 - 1.00 mg/dL Final  59/93/5701 77:93 PM 0.82 0.57 - 1.00 mg/dL Final  90/30/0923 30:07 PM 0.79 0.57 - 1.00 mg/dL Final  62/26/3335 45:62 PM 0.77 0.57 - 1.00 mg/dL Final  56/38/9373 42:87 AM 0.72 0.44 - 1.00 mg/dL Final  68/06/5725 20:35 PM 0.87 0.44 - 1.00 mg/dL Final  59/74/1638 45:36 AM 0.70 0.57 - 1.00 mg/dL Final  46/80/3212 24:82 AM 0.67 0.44 - 1.00 mg/dL Final  50/10/7046 88:91 AM 0.58 0.44 - 1.00 mg/dL Final  69/45/0388 82:80 AM 0.69 0.44 - 1.00 mg/dL Final  03/49/1791 50:56 AM 0.64 0.44 - 1.00 mg/dL Final  97/94/8016 55:37 AM 0.74 0.44 - 1.00 mg/dL Final  48/27/0786 75:44 PM 0.76 0.44 - 1.00 mg/dL Final  92/08/69 21:97 AM 0.91 0.44 - 1.00 mg/dL Final  58/83/2549 82:64 PM 0.95 0.44 - 1.00 mg/dL Final  15/83/0940 76:80 AM 0.82 0.44 - 1.00 mg/dL Final  88/06/314  03:53 AM 0.93 0.44 - 1.00 mg/dL Final  21/30/8657 84:69 AM 0.78 0.44 - 1.00 mg/dL Final    CBC: Recent Labs  Lab 05/19/2023 1353 05/24/2023 1547 06/15/23 0516 06/15/23 1753  0020 06/17/23 0400  WBC 19.0* 14.2* 31.4*  --  33.9* 42.2*  NEUTROABS 16.6* 12.2*  --   --   --   --   HGB 13.9 12.1 9.9* 10.9* 10.2* 9.2*  HCT 41.3 36.5 30.1* 33.0* 30.2* 27.7*  MCV 89.8 90.6 91.8  --  92.6 93.3  PLT 438* 347 425*  --  392 360    Microbiology: Results for orders placed or performed during the hospital encounter of 06/08/2023  Culture, blood (Routine x 2)     Status: None (Preliminary result)   Collection Time: 05/27/2023  1:53 PM   Specimen: BLOOD  Result Value Ref Range Status   Specimen Description BLOOD LEFT ANTECUBITAL  Final   Special Requests   Final    BOTTLES DRAWN AEROBIC AND ANAEROBIC Blood Culture adequate volume   Culture   Final    NO GROWTH 3 DAYS Performed at [facility], [address], [place], [name] [address]    Report Status PENDING  Incomplete   Resp panel by RT-PCR (RSV, Flu A&B, Covid) Anterior Nasal Swab     Status: None   Collection Time: 05/21/2023  3:47 PM   Specimen: Anterior Nasal Swab  Result Value Ref Range Status   SARS Coronavirus 2 by RT PCR NEGATIVE NEGATIVE Final    Comment: (NOTE) SARS-CoV-2 target nucleic acids are NOT DETECTED.  The SARS-CoV-2 RNA is generally detectable in upper respiratory specimens during the acute phase of infection. The lowest concentration of SARS-CoV-2 viral copies this assay can detect is 138 copies/mL. A negative result does not preclude SARS-Cov-2 infection and should not be used as the sole basis for treatment or other patient management decisions. A negative result may occur with  improper specimen collection/handling, submission of specimen other than nasopharyngeal swab, presence of viral mutation(s) within the areas targeted by this assay, and inadequate number of viral copies(<138 copies/mL). A negative result must be combined with clinical observations, patient history, and epidemiological information. The expected result is Negative.  Fact Sheet for Patients:  BloggerCourse.com  Fact Sheet for Healthcare Providers:  SeriousBroker.it  This test is no t yet approved or cleared by the [place] FDA and  has been authorized for detection and/or diagnosis of SARS-CoV-2 by FDA under an Emergency Use Authorization (EUA). This EUA will remain  in effect (meaning this test can be used) for the duration of the COVID-19 declaration under Section 564(b)(1) of the Act, 21 U.S.C.section 360bbb-3(b)(1), unless the authorization is terminated  or revoked sooner.       Influenza A by PCR NEGATIVE NEGATIVE Final   Influenza B by PCR NEGATIVE NEGATIVE Final    Comment: (NOTE) The Xpert Xpress SARS-CoV-2/FLU/RSV plus assay is intended as an aid in the diagnosis of influenza from Nasopharyngeal swab specimens and should not be used  as a sole basis for treatment. Nasal washings and aspirates are unacceptable for Xpert Xpress SARS-CoV-2/FLU/RSV testing.  Fact Sheet for Patients: BloggerCourse.com  Fact Sheet for Healthcare Providers: SeriousBroker.it  This test is not yet approved or cleared by the [place] FDA and has been authorized for detection and/or diagnosis of SARS-CoV-2 by FDA under an Emergency Use Authorization (EUA). This EUA will remain in effect (meaning this test can be used) for the duration of the COVID-19  declaration under Section 564(b)(1) of the Act, 21 U.S.C. section 360bbb-3(b)(1), unless the authorization is terminated or revoked.     Resp Syncytial Virus by PCR NEGATIVE NEGATIVE Final    Comment: (NOTE) Fact Sheet for Patients: BloggerCourse.com  Fact Sheet for Healthcare Providers: SeriousBroker.it  This test is not yet approved or cleared by the [place] FDA and has been authorized for detection and/or diagnosis of SARS-CoV-2 by FDA under an Emergency Use Authorization (EUA). This EUA will remain in effect (meaning this test can be used) for the duration of the COVID-19 declaration under Section 564(b)(1) of the Act, 21 U.S.C. section 360bbb-3(b)(1), unless the authorization is terminated or revoked.  Performed at [facility], [address] [place], [name] [address]   Culture, blood (Routine x 2)     Status: None (Preliminary result)   Collection Time: 06/03/2023  8:30 PM   Specimen: BLOOD  Result Value Ref Range Status   Specimen Description BLOOD BLOOD RIGHT ARM  Final   Special Requests   Final    BOTTLES DRAWN AEROBIC AND ANAEROBIC Blood Culture adequate volume   Culture   Final    NO GROWTH 3 DAYS Performed at [facility], [address] [place], [name] [address]    Report Status PENDING  Incomplete  MRSA Next Gen by PCR, Nasal      Status: None   Collection Time: 06/15/23  6:50 AM   Specimen: Nasal Mucosa; Nasal Swab  Result Value Ref Range Status   MRSA by PCR Next Gen NOT DETECTED NOT DETECTED Final    Comment: (NOTE) The GeneXpert MRSA Assay (FDA approved for NASAL specimens only), is one component of a comprehensive MRSA colonization surveillance program. It is not intended to diagnose MRSA infection nor to guide or monitor treatment for MRSA infections. Test performance is not FDA approved in patients less than 49 years old. Performed at [facility], [address] [place], [name] [address]   Culture, Respiratory w Gram Stain     Status: None (Preliminary result)   Collection Time: 06/15/23 10:20 PM   Specimen: Tracheal Aspirate; Respiratory  Result Value Ref Range Status   Specimen Description   Final    TRACHEAL ASPIRATE Performed at [facility], [address] [place], [name] [address]    Special Requests   Final    NONE Performed at [facility], [address] [place], [name] [address]    Gram Stain NO WBC SEEN NO ORGANISMS SEEN   Final   Culture   Final    CULTURE REINCUBATED FOR BETTER GROWTH Performed at [facility] [facility], [address] [place], [name] [address]    Report Status PENDING  Incomplete    Urinalysis: Recent Labs    05/18/2023 2158  COLORURINE YELLOW*  LABSPEC 1.041*  PHURINE 5.0  GLUCOSEU NEGATIVE  HGBUR NEGATIVE  BILIRUBINUR NEGATIVE  KETONESUR NEGATIVE  PROTEINUR 100*  NITRITE NEGATIVE  LEUKOCYTESUR NEGATIVE     Imaging:  US RENAL  Result Date:  CLINICAL DATA:  Acute kidney injury EXAM: RENAL / URINARY TRACT ULTRASOUND COMPLETE COMPARISON:  None Available. FINDINGS: Right Kidney: Renal measurements: 9.7 x 4.1 x 4.4 cm = volume: 90.9 mL. Echogenicity within normal limits. No mass or hydronephrosis visualized. Left Kidney: Renal measurements: 8.9 x 4.2 x 4.0 cm = volume: 79.2 mL. Echogenicity within normal limits.  No mass or hydronephrosis visualized. Bladder: Bladder is decompressed and contains a Foley catheter. Other: None. IMPRESSION: No hydronephrosis.  Electronically Signed   By: Allegra Lai M.D.   On:  18:51   [place] EKG SITE RITE  Result Date:  If Site Rite image not attached, placement could not be confirmed due to current cardiac rhythm.    Assessment & Plan:   82 y.o. female with a PMHx of hypertension, coronary artery disease, COPD, PAD, chronic pain syndrome, admitted initially with history of leg pain.  Subsequently patient developed hypoxic respiratory failure needing to be placed on ventilation.  He is also being treated for multifocal pneumonia.  Patient also had CT angiogram of the chest on the 30th.  #1: Acute kidney injury: Acute kidney injury most likely secondary to contrast nephropathy that she received on October 30.  This may also be compromised/complicated with history of hypotension and septic shock leading to ischemic nephropathy.  Overall renal prognosis is poor.  Will continue to monitor urine output closely.  #2: Sepsis/pneumonia: Patient has pneumonia.  Continue Zosyn and vancomycin.  #3: Hypotension: Hypotension most likely secondary to sepsis.  Continue pressors as needed and wean them slowly.  #4: Vent failure: Patient is now sedated on the ventilator.  Continue to monitor.  Principal Problem:   Acute hypoxic respiratory failure (HCC) Active Problems:   AKI (acute kidney injury) (HCC)   Community acquired pneumonia   Toxic metabolic encephalopathy  Overall prognosis is poor.  Will continue to monitor closely.  LOS: 3 Lorain Childes, MD [facility]. 11/2/20241:53 PM

## 2023-06-17 NOTE — Progress Notes (Signed)
NAME:  Savannah Mcclure, MRN:  [id number], DOB:  [birth year], LOS: 3 ADMISSION DATE:  06/01/2023, CHIEF COMPLAINT:  Respiratory Failure   History of Present Illness:   82 year old female presenting to [facility] ED from home on 05/29/2023 for evaluation of bilateral lower extremity lower back pain as well as shortness of breath.   History provided per chart review and daughter's telephone interview, patient unable to participate in interview at this time.  On arrival to EDP patient reporting significant lower back and bilateral lower extremity pain as well as shortness of breath.  Patient confirmed productive cough with EDP on arrival.   Daughter confirms the patient has been experiencing chest pain, fever and chills as well as a new cough. Patient was scheduled for a bilateral femoral endarterectomies on 06/15/2023 after being followed outpatient by vascular surgery for severe PAD.  Daughter reports the patient's pain has not been well-controlled at home.  The patient has been unable to walk, screaming out at times with correlating shortness of breath and " blue lips".  The patient has been telling the daughter she feels as though she is 'on her way out'. The daughter found her current state extremely upsetting stating she "did not want to watch her die".  Daughter had called EMS 3 times, between 10/28 and 10/30, but the patient refused to be taken to the ED. daughter confirms the patient smokes 2 packs of cigarettes daily and has been for the past 5 years prior to that she reports a 50+ pack year history.  Daughter also confirms intermittent marijuana use and some EtOH use but not daily. Of note patient was seen in the ED on 06/12/2023 and prescribed short course of oxycodone.   ED course: Upon arrival patient alert and responsive- tachycardic, tachypneic and hypoxic at 86% on room air.  Lab work concerning for sepsis with lactic acidosis and leukocytosis, mild AKI and NAGMA as well as mild  transaminitis.  Initial chest x-ray concerning for pneumonia.  Sepsis protocol initiated with empiric antibiotics and IV fluid resuscitation.  CT angio ordered the patient has contrast allergy requiring premedication.  After CT angio patient's hypoxia, tachycardia and work of breathing worsened significantly ultimately requiring urgent intubation and mechanical ventilatory support.  CT angio positive for PE without right heart strain as well as significant bilateral groundglass opacities.  Medications given: Benadryl, heparin bolus and drip initiation, Solu-Medrol, Versed/etomidate/succinylcholine, Levaquin, 3 L IV fluid bolus, propofol and fentanyl drip started, IV contrast  Pertinent  Medical History  Blind right eye COPD Hypertension PAD CVA Hyperlipidemia Anxiety & depression CAD Hypothyroidism Thoracic spondylosis without myelopathy  Significant Hospital Events: Including procedures, antibiotic start and stop dates in addition to other pertinent events   06/13/2023: Admit patient to ICU with acute hypoxic respiratory failure secondary to acute PE and multifocal pneumonia in the setting of COPD and severe PAD. 06/15/2023: remained on the vent, increased oxygen requirements : oxygen requirements improved, remains vented 06/17/2023: failed SBT  Interim History / Subjective:  Remains sedated, confused, doesn't follow commands.  Objective   Blood pressure (!) 113/52, pulse (!) 109, temperature (!) 100.9 F (38.3 C), temperature source Bladder, resp. rate (!) 23, height 5\' 3"  (1.6 m), weight 52.6 kg, SpO2 100%.    Vent Mode: PRVC FiO2 (%):  [40 %-50 %] 50 % Set Rate:  [28 bmp] 28 bmp Vt Set:  [400 mL] 400 mL PEEP:  [5 cmH20] 5 cmH20 Pressure Support:  [5 cmH20] 5 cmH20 Plateau Pressure:  [22 cmH20]  22 cmH20   Intake/Output Summary (Last 24 hours) at 06/17/2023 0907 Last data filed at 06/17/2023 0740 Gross per 24 hour  Intake 2984.59 ml  Output 245 ml  Net 2739.59 ml    Filed Weights   05/24/2023 1329  0321 06/17/23 0411  Weight: 52.2 kg 52.4 kg 52.6 kg    Examination: Physical Exam Constitutional:      General: She is not in acute distress.    Appearance: She is ill-appearing.  Cardiovascular:     Rate and Rhythm: Normal rate and regular rhythm.     Pulses: Normal pulses.     Heart sounds: Normal heart sounds.  Pulmonary:     Effort: Pulmonary effort is normal.     Breath sounds: Normal breath sounds.     Comments: Ventilated breath sounds Abdominal:     Palpations: Abdomen is soft.  Neurological:     Mental Status: She is alert. She is disoriented.     Assessment & Plan:   Neurology #Toxic Metabolic Encephalopathy #Advanced Dementia #Carotid artery stenosis  Presenting with worsening altered mental status secondary to infection (pneumonia). Intubated for respiratory failure and initiated on Propofol with PRN fentanyl for analgosedation. Switching to dexmedetomidine today and will continue with daily wake up assessments.  -Maintain a RASS goal of -1 -Dexmedetomidine and Fentanyl to maintain RASS goal -Avoid sedating medications as able -Hold Gabapentin and diphenhydramine -add home oxycodone to help with pain  Cardiovascular #NSTEMI #Sepsis #CAD #PAD  Presenting with circulatory shock secondary to sepsis from pneumonia with component of vasodilatory effect of propofol. Maintained on low dose nor-epinephrine, with goal MAP > 65 mmHg. She has a history of CAD, followed by Dr. Mariah Milling outpatient, and recently underwent a pharmacological myocardial perfusion scan showing low risk for MI. She also has a history of PAD noted on angiography with vascular surgery, with history of stenting and most recently found to have near occlusive stenosis of the right CFA, 85% stenosis in the right profunda femoris, and 60% disease in the left CFA. Appreciate input from cardiology and vascular surgery regarding management of anti-platelets and  anti-coagulation. Continued on aspirin and holding ticagrelol  -continue heparin gtt -nor-epinephrine, goal MAP > 65 -lactic acid WNL -continue to trend troponin -TTE re-assuring  Pulmonary #Acute Hypoxic Respiratory Failure #Subsegmental PE #Community Acquired Pneumonia #COPD  Was intubated for hypoxic respiratory failure, with imaging showing bilateral infiltrates with a RLL consolidation, consistent with infection. She's also found to have subsegmental PE for which heparin was initiated. On antibiotics and steroids (see below) and ventilator settings are improved. Continue nebulizers for COPD. Her barrier to extubation remains significant metabolic acidosis in the setting of kidney injury.  -Full vent support, implement lung protective strategies -Plateau pressures less than 30 cm H20 -Wean FiO2 & PEEP as tolerated to maintain O2 sats >92% -Follow intermittent Chest X-ray & ABG as needed -Spontaneous Breathing Trials when respiratory parameters met and mental status permits -Duonebs standing every 6 hours -Implement VAP Bundle  Gastrointestinal #Transaminitis  Mild elevation in transaminases has resolved, holding hepatotoxins, but this is likely secondary to sepsis and hypotension. Initiating tube feeds and PPI for SUP.  -hold rosuvastatin  Renal #Acute Kidney Injury #Metabolic Acidosis  In the setting of sepsis and hypotension. Has also received contrast and we will hold off on any diuresis at this time. Avoiding nephrotoxins as able. Patient with mixed respiratory and metabolic acidosis. Appreciate input from nephrology. Prognosis is poor for renal recovery. Appreciate input from palliative care.  -US  kidneys pending -monitor I/O's -avoid nephrotoxins -trend ABG  Endocrine #Hypothyroidism  TSH of 47 a week ago. T4 is low normal, T3 is pending. Could represent subclinical hypothyroidism, but will await T3 before deciding on therapy. Patient on hydrocortisone for  adjunctive therapy of severe pneumonia per CAPECOD study.  Hem/Onc  On heparin gtt for management of subsegmental PE. Hold ticagrelol.  ID #Community Acquired Pneumonia #Severe Sepsis  Found to have a RLL infiltrate and consolidation on CT chest. Will initiate broad spectrum antibiotics, including atypical coverage (with Doxycycline). MRSA screen, respiratory cultures, and legionella/strep urinary antigen ordered.  -continue Doxycycline/Zosyn  Best Practice (right click and "Reselect all SmartList Selections" daily)   Diet/type: tubefeeds DVT prophylaxis: systemic heparin GI prophylaxis: PPI Lines: N/A Foley:  Yes, and it is still needed Code Status:  full code Last date of multidisciplinary goals of care discussion [06/17/2023]  Labs   CBC: Recent Labs  Lab 05/25/2023 1353 05/24/2023 1547 06/15/23 0516 06/15/23 1753  0020 06/17/23 0400  WBC 19.0* 14.2* 31.4*  --  33.9* 42.2*  NEUTROABS 16.6* 12.2*  --   --   --   --   HGB 13.9 12.1 9.9* 10.9* 10.2* 9.2*  HCT 41.3 36.5 30.1* 33.0* 30.2* 27.7*  MCV 89.8 90.6 91.8  --  92.6 93.3  PLT 438* 347 425*  --  392 360    Basic Metabolic Panel: Recent Labs  Lab 06/12/23 1210 05/27/2023 1353 06/07/2023 2158 06/15/23 0516 06/15/23 1444  0345 06/17/23 0400  NA 136 137  --  138  --  134* 128*  K 3.4* 3.6  --  3.2* 3.0* 4.6 4.5  CL 99 99  --  104  --  100 96*  CO2 23 20*  --  22  --  20* 18*  GLUCOSE 167* 181*  --  225*  --  167* 200*  BUN 18 36*  --  37*  --  46* 63*  CREATININE 1.08* 1.16*  --  1.12*  --  1.70* 2.66*  CALCIUM 9.0 8.8*  --  7.9*  --  7.8* 7.4*  MG  --   --  1.4* 2.4  --  2.2 2.0  PHOS  --   --  3.5 4.2  --  4.0 4.9*   GFR: Estimated Creatinine Clearance: 13.5 mL/min (A) (by C-G formula based on SCr of 2.66 mg/dL (H)). Recent Labs  Lab 05/19/2023 1547 05/23/2023 1759 06/03/2023 2158 06/15/23 0030 06/15/23 0516  0020  0345  0845  1055 06/17/23 0400   PROCALCITON  --   --  3.76  --  6.49  --  6.67  --   --   --   WBC 14.2*  --   --   --  31.4* 33.9*  --   --   --  42.2*  LATICACIDVEN 2.2*   < > 2.9* 1.6  --   --   --  1.4 1.4  --    < > = values in this interval not displayed.    Liver Function Tests: Recent Labs  Lab 06/01/2023 1353 06/15/23 0516  AST 57* 38  ALT 25 17  ALKPHOS 83 64  BILITOT 1.2 1.2  PROT 8.8* 6.2*  ALBUMIN 3.9 2.7*   No results for input(s): "LIPASE", "AMYLASE" in the last 168 hours. No results for input(s): "AMMONIA" in the last 168 hours.  ABG    Component Value Date/Time   PHART 7.1 (LL) 06/17/2023 0814   PCO2ART 53 (H) 06/17/2023 9604  PO2ART 51 (L) 06/17/2023 0814   HCO3 16.5 (L) 06/17/2023 0814   ACIDBASEDEF 13.3 (H) 06/17/2023 0814   O2SAT 81.8 06/17/2023 0814     Coagulation Profile: Recent Labs  Lab 06/12/23 1210 05/21/2023 1547  INR 1.3* 1.4*    Cardiac Enzymes: No results for input(s): "CKTOTAL", "CKMB", "CKMBINDEX", "TROPONINI" in the last 168 hours.  HbA1C: Hgb A1c MFr Bld  Date/Time Value Ref Range Status  06/15/2023 07:44 AM 5.9 (H) 4.8 - 5.6 % Final    Comment:    (NOTE) Pre diabetes:          5.7%-6.4%  Diabetes:              >6.4%  Glycemic control for   <7.0% adults with diabetes     CBG: Recent Labs  Lab  1524  1941  2338 06/17/23 0351 06/17/23 0728  GLUCAP 211* 143* 131* 205* 166*    Review of Systems:   Unable to obtain  Past Medical History:  She,  has a past medical history of Abdominal distension (03/18/2012), Abnormal weight loss (10/30/2017), Acute kidney injury (HCC) (03/22/2012), Aneurysm (HCC), Anxiety, Blind right eye, Cardiac arrest (HCC), Cataract, Claudication (HCC), Complication of anesthesia (03/15/2012), COPD (chronic obstructive pulmonary disease) (HCC), Delirium (03/18/2012), Dyspnea, GERD (gastroesophageal reflux disease), Heart murmur, Hypertension, Leaky heart valve, Oxygen deficit, Peripheral vascular disease  (HCC), Scoliosis, Sepsis (HCC), Shock (HCC) (03/23/2012), SIRS (systemic inflammatory response syndrome) (HCC) (03/23/2012), Spinal stenosis, Stroke (HCC), and Tachycardia.   Surgical History:   Past Surgical History:  Procedure Laterality Date   BACK SURGERY     BREAST LUMPECTOMY Left    CAROTID PTA/STENT INTERVENTION Right 09/19/2022   Procedure: CAROTID PTA/STENT INTERVENTION;  Surgeon: Annice Needy, MD;  Location: [facility];  Service: Cardiovascular;  Laterality: Right;   CATARACT EXTRACTION W/PHACO Left 08/14/2019   Procedure: CATARACT EXTRACTION PHACO AND INTRAOCULAR LENS PLACEMENT (IOC) LEFT 10.22 00:59.7 42.4%;  Surgeon: Lockie Mola, MD;  Location: [facility];  Service: Ophthalmology;  Laterality: Left;  leave it last patient per [place]   LOWER EXTREMITY ANGIOGRAPHY Right 06/20/2018   Procedure: LOWER EXTREMITY ANGIOGRAPHY;  Surgeon: Annice Needy, MD;  Location: [facility];  Service: Cardiovascular;  Laterality: Right;   LOWER EXTREMITY ANGIOGRAPHY Left 02/03/2020   Procedure: LOWER EXTREMITY ANGIOGRAPHY;  Surgeon: Annice Needy, MD;  Location: [facility];  Service: Cardiovascular;  Laterality: Left;   LOWER EXTREMITY ANGIOGRAPHY Right 02/20/2023   Procedure: Lower Extremity Angiography;  Surgeon: Annice Needy, MD;  Location: [facility];  Service: Cardiovascular;  Laterality: Right;   right eye surgery     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 She has a 20 pack-year smoking history. She has never used smokeless tobacco. She reports current alcohol use. She reports that she does not use drugs.   Family History:  Her family history includes Heart disease in her paternal grandfather and paternal grandmother; Heart failure in her father; Hypertension in her mother; Intracerebral hemorrhage in her  mother; Kidney disease in her maternal grandmother; Rheum arthritis in her maternal grandmother; Schizophrenia in her daughter; Stroke in her paternal grandfather and paternal grandmother.   Allergies Allergies  Allergen Reactions   Plavix [Clopidogrel] Other (See Comments)    States "I hurt so bad I  couldn't get out of bed."   Other Itching    Cat Gut sutures    Oxycodone Hives   Amlodipine Hives   Atorvastatin Other (See Comments)    weakness   Augmentin [Amoxicillin-Pot Clavulanate] Diarrhea   Azithromycin Hives   Contrast Media [Iodinated Contrast Media] Hives   Doxycycline Diarrhea   Erythromycin Diarrhea   Hydrocodone Nausea Only   Lactose Intolerance (Gi) Diarrhea    Bloating   Minocycline Diarrhea   Nsaids Other (See Comments)    Internal Bleeding   Relafen [Nabumetone] Hives   Sulfa Antibiotics Hives   Synthroid [Levothyroxine] Diarrhea   Tetracyclines & Related Diarrhea   Tilactase    Tramadol Rash    Takes +/- Benadryl     Home Medications  Prior to Admission medications   Medication Sig Start Date End Date Taking? Authorizing Provider  albuterol (VENTOLIN HFA) 108 (90 Base) MCG/ACT inhaler Inhale 2 puffs into the lungs every 6 (six) hours as needed for wheezing or shortness of breath. 06/08/23   Johnson, Megan P, DO  aspirin EC 81 MG tablet Take 1 tablet (81 mg total) by mouth daily at 6 (six) AM. Swallow whole. 06/08/23   Johnson, Megan P, DO  atenolol (TENORMIN) 25 MG tablet Take 1 tablet (25 mg total) by mouth daily. 06/08/23   Johnson, Megan P, DO  B-COMPLEX-C PO Take 1 tablet by mouth daily.    [provider]  benazepril (LOTENSIN) 10 MG tablet Take 1 tablet (10 mg total) by mouth daily. 06/08/23   Olevia Perches P, DO  BRILINTA 90 MG TABS tablet TAKE 1 TABLET TWICE A DAY 05/17/23   Georgiana Spinner, NP  Collagen-Vitamin C-Biotin (COLLAGEN 1500/C) 500-50-0.8 MG CAPS Take by mouth.    [provider]  cyclobenzaprine (FLEXERIL) 5 MG tablet  Take 1 tablet (5 mg total) by mouth 3 (three) times daily as needed for muscle spasms. 06/08/23   Johnson, Megan P, DO  diphenhydrAMINE (BENADRYL) 25 MG tablet Take 25 mg by mouth every 6 (six) hours as needed.     [provider]  fluticasone (FLONASE) 50 MCG/ACT nasal spray 2 sprays into each nostril in the morning. Up to three sprays. 11/18/20   [provider]  Fluticasone-Umeclidin-Vilant (TRELEGY ELLIPTA) 100-62.5-25 MCG/ACT AEPB Inhale 1 puff into the lungs daily for 1 day. 06/08/23   Johnson, Megan P, DO  gabapentin (NEURONTIN) 300 MG capsule Take 1 capsule (300 mg total) by mouth 2 (two) times daily. 06/08/23   Johnson, Megan P, DO  hydrOXYzine (ATARAX) 25 MG tablet Take 1 tablet (25 mg total) by mouth 3 (three) times daily as needed. 06/08/23   Johnson, Megan P, DO  lidocaine (LIDODERM) 5 % Place 1 patch onto the skin daily. Remove & Discard patch within 12 hours or as directed by MD 11/26/21   Edward Jolly, MD  Misc Natural Products (JOINT HEALTH PO) Take by mouth daily.    [provider]  montelukast (SINGULAIR) 10 MG tablet Take 1 tablet (10 mg total) by mouth at bedtime. 06/08/23   Olevia Perches P, DO  Multiple Vitamins-Minerals (MULTIVITAMIN WITH MINERALS) tablet Take 1 tablet by mouth daily.    [provider]  NIACINAMIDE PO Take 1 capsule by mouth daily.    [provider]  nitroGLYCERIN (NITROSTAT) 0.4 MG SL tablet Place 1 tablet (0.4 mg total) under the tongue every 5 (five) minutes as needed for chest pain. 05/02/23   Antonieta Iba, MD  omeprazole (PRILOSEC) 20  MG capsule Take 1 capsule (20 mg total) by mouth daily. 06/08/23   Johnson, Megan P, DO  ondansetron (ZOFRAN-ODT) 4 MG disintegrating tablet Take 1 tablet (4 mg total) by mouth every 8 (eight) hours as needed for nausea or vomiting. 10/27/22   Becky Augusta, NP  oxyCODONE (ROXICODONE) 5 MG immediate release tablet Take 1 tablet (5 mg total) by mouth every 8 (eight) hours as  needed. 06/12/23 06/11/24  Chesley Noon, MD  rosuvastatin (CRESTOR) 5 MG tablet Take 1 tablet (5 mg total) by mouth daily. 06/08/23   Johnson, Megan P, DO  thyroid (ARMOUR) 130 MG tablet Take 1 tablet (130 mg total) by mouth every other day. 06/09/23   Dorcas Carrow, DO     Critical care time: 34 minutes    Raechel Chute, MD [facility] 06/17/2023 7:00 PM

## 2023-06-17 NOTE — Consult Note (Signed)
PHARMACY CONSULT NOTE - ELECTROLYTES  Pharmacy Consult for Electrolyte Monitoring and Replacement   Recent Labs: Height: 5\' 3"  (160 cm) Weight: 52.6 kg (115 lb 15.4 oz) IBW/kg (Calculated) : 52.4 Estimated Creatinine Clearance: 13.5 mL/min (A) (by C-G formula based on SCr of 2.66 mg/dL (H)).  Potassium (mmol/L)  Date Value  06/17/2023 4.5  09/11/2012 3.8   Magnesium (mg/dL)  Date Value  78/29/5621 2.0   Calcium (mg/dL)  Date Value  30/86/5784 7.4 (L)   Calcium, Total (mg/dL)  Date Value  69/62/9528 8.5   Albumin (g/dL)  Date Value  41/32/4401 2.7 (L)  06/08/2023 4.2   Phosphorus (mg/dL)  Date Value  02/72/5366 4.9 (H)   Sodium (mmol/L)  Date Value  06/17/2023 128 (L)  06/08/2023 142  09/12/2012 132 (L)   Corrected Ca: 8.84 mg/dL  Assessment  Savannah Mcclure is a 82 y.o. female presenting with bilateral lower extremity back pain and SOB. PMH significant for COPD, PAD, HLD, CAD, CVA, hypothyroidism, & anxiety/depression. Pharmacy has been consulted to monitor and replace electrolytes.  Goal of Therapy: Electrolytes WNL  Plan:  No electrolyte replacement indicated at this time  Check BMP, Mg, Phos with AM labs  Thank you for allowing pharmacy to be a part of this patient's care.  Bettey Costa, PharmD Clinical Pharmacist 06/17/2023 9:51 AM

## 2023-06-17 NOTE — Consult Note (Signed)
PHARMACY - ANTICOAGULATION CONSULT NOTE  Pharmacy Consult for heparin infusion Indication: pulmonary embolus  Allergies  Allergen Reactions   Plavix [Clopidogrel] Other (See Comments)    States "I hurt so bad I couldn't get out of bed."   Other Itching    Cat Gut sutures    Oxycodone Hives   Amlodipine Hives   Atorvastatin Other (See Comments)    weakness   Augmentin [Amoxicillin-Pot Clavulanate] Diarrhea   Azithromycin Hives   Contrast Media [Iodinated Contrast Media] Hives   Doxycycline Diarrhea   Erythromycin Diarrhea   Hydrocodone Nausea Only   Lactose Intolerance (Gi) Diarrhea    Bloating   Minocycline Diarrhea   Nsaids Other (See Comments)    Internal Bleeding   Relafen [Nabumetone] Hives   Sulfa Antibiotics Hives   Synthroid [Levothyroxine] Diarrhea   Tetracyclines & Related Diarrhea   Tilactase    Tramadol Rash    Takes +/- Benadryl   Patient Measurements: Height: 5\' 3"  (160 cm) Weight: 52.6 kg (115 lb 15.4 oz) IBW/kg (Calculated) : 52.4 Heparin Dosing Weight: 52.2 kg  Vital Signs: Temp: 99 F (37.2 C) (11/02 0400) BP: 126/62 (11/02 0400) Pulse Rate: 112 (11/02 0400)  Labs: Recent Labs    05/18/2023 1353 06/06/2023 1547 05/18/2023 2158 06/15/23 0030 06/15/23 0227 06/15/23 0516 06/15/23 0744 06/15/23 1444 06/15/23 1752 06/15/23 1753  0020  0345  0845 06/17/23 0400  HGB 13.9 12.1  --   --   --  9.9*  --   --   --  10.9* 10.2*  --   --  9.2*  HCT 41.3 36.5  --   --   --  30.1*  --   --   --  33.0* 30.2*  --   --  27.7*  PLT 438* 347  --   --   --  425*  --   --   --   --  392  --   --  360  APTT  --   --  26  --   --   --   --   --   --   --   --   --   --   --   LABPROT  --  17.8*  --   --   --   --   --   --   --   --   --   --   --   --   INR  --  1.4*  --   --   --   --   --   --   --   --   --   --   --   --   HEPARINUNFRC  --   --   --   --   --  0.48  --    < >  --   --  0.54  --  0.45 0.47  CREATININE 1.16*  --   --    --   --  1.12*  --   --   --   --   --  1.70*  --   --   TROPONINIHS  --   --  150*   < > 365*  --  414*  --  199*  --   --   --   --   --    < > = values in this interval not displayed.   Estimated Creatinine Clearance: 21.1 mL/min (A) (by C-G formula based on  SCr of 1.7 mg/dL (H)).  Medical History: Past Medical History:  Diagnosis Date   Abdominal distension 03/18/2012   Abnormal weight loss 10/30/2017   Acute kidney injury (HCC) 03/22/2012   Aneurysm (HCC)    right eye   Anxiety    Blind right eye    Cardiac arrest [facility])    Cataract    right eye   Claudication (HCC)    Complication of anesthesia 03/15/2012   "crashed" during first part of 3-part back surgery.    COPD (chronic obstructive pulmonary disease) (HCC)    Delirium 03/18/2012   Dyspnea    GERD (gastroesophageal reflux disease)    Heart murmur    Hypertension    Leaky heart valve    Oxygen deficit    uses O2 at night 2 liters   Peripheral vascular disease (HCC)    Scoliosis    Sepsis (HCC)    Shock (HCC) 03/23/2012   SIRS (systemic inflammatory response syndrome) (HCC) 03/23/2012   Spinal stenosis    Stroke (HCC)    Eye   Tachycardia    Medications:  No home anticoagulants per pharmacist review  Assessment: 82 yo female presented to the ED due to pain in lower back and legs.  Patient was scheduled for bilateral femoral vascular procedures on 10/31.  Imaging today reveal PE.  Pharmacy consulted to start heparin infusion.  Goal of Therapy:  Heparin level 0.3-0.7 units/ml Monitor platelets by anticoagulation protocol: Yes  10/31 0516 HL 0.48, therapeutic x 1 10/31 1444 HL 0.29; subtherapeutic 11/01 0020 HL 0.54, therapeutic x 1 11/01 0845 HL 0.45, therapeutic x 2 11/02 0351 HL 0.47, therapeutic x 3   Plan:  Continue heparin infusion at 950 units/hr  Recheck HL daily w/ AM labs while therapeutic CBC daily while on heparin  Otelia Sergeant, PharmD, [facility] 06/17/2023 4:28 AM

## 2023-06-17 NOTE — Plan of Care (Signed)
  Problem: Education: Goal: Knowledge of General Education information will improve Description: Including pain rating scale, medication(s)/side effects and non-pharmacologic comfort measures Outcome: Not Applicable   Problem: Health Behavior/Discharge Planning: Goal: Ability to manage health-related needs will improve Outcome: Not Applicable   Problem: Clinical Measurements: Goal: Ability to maintain clinical measurements within normal limits will improve Outcome: Progressing Goal: Will remain free from infection Outcome: Progressing Goal: Diagnostic test results will improve Outcome: Progressing Goal: Respiratory complications will improve Outcome: Progressing Goal: Cardiovascular complication will be avoided Outcome: Progressing   Problem: Activity: Goal: Risk for activity intolerance will decrease Outcome: Progressing   Problem: Nutrition: Goal: Adequate nutrition will be maintained Outcome: Progressing   Problem: Coping: Goal: Level of anxiety will decrease Outcome: Progressing   Problem: Elimination: Goal: Will not experience complications related to bowel motility Outcome: Progressing Goal: Will not experience complications related to urinary retention Outcome: Progressing   Problem: Pain Management: Goal: General experience of comfort will improve Outcome: Progressing   Problem: Safety: Goal: Ability to remain free from injury will improve Outcome: Progressing   Problem: Skin Integrity: Goal: Risk for impaired skin integrity will decrease Outcome: Progressing   Problem: Education: Goal: Ability to describe self-care measures that may prevent or decrease complications (Diabetes Survival Skills Education) will improve Outcome: Not Applicable Goal: Individualized Educational Video(s) Outcome: Not Applicable   Problem: Coping: Goal: Ability to adjust to condition or change in health will improve Outcome: Not Applicable   Problem: Fluid Volume: Goal:  Ability to maintain a balanced intake and output will improve Outcome: Not Applicable   Problem: Health Behavior/Discharge Planning: Goal: Ability to identify and utilize available resources and services will improve Outcome: Not Applicable Goal: Ability to manage health-related needs will improve Outcome: Not Applicable   Problem: Metabolic: Goal: Ability to maintain appropriate glucose levels will improve Outcome: Progressing   Problem: Nutritional: Goal: Maintenance of adequate nutrition will improve Outcome: Progressing Goal: Progress toward achieving an optimal weight will improve Outcome: Progressing   Problem: Skin Integrity: Goal: Risk for impaired skin integrity will decrease Outcome: Progressing   Problem: Tissue Perfusion: Goal: Adequacy of tissue perfusion will improve Outcome: Progressing   Problem: Activity: Goal: Ability to tolerate increased activity will improve Outcome: Progressing   Problem: Respiratory: Goal: Ability to maintain a clear airway and adequate ventilation will improve Outcome: Progressing   Problem: Role Relationship: Goal: Method of communication will improve Outcome: Progressing

## 2023-06-17 NOTE — Plan of Care (Signed)
Pt responds to voice this shift. Unable to follow commands. Foley removed to decrease infection risk. Pure wic in place. Failed weaning trial, oxygen saturations dropped to 85%. BP unable to maintain off pressors. Will continue to monitor.

## 2023-06-17 NOTE — Plan of Care (Signed)
  Problem: Clinical Measurements: Goal: Ability to maintain clinical measurements within normal limits will improve Outcome: Progressing Goal: Will remain free from infection Outcome: Progressing Goal: Diagnostic test results will improve Outcome: Progressing Goal: Respiratory complications will improve Outcome: Progressing Goal: Cardiovascular complication will be avoided Outcome: Progressing   Problem: Activity: Goal: Risk for activity intolerance will decrease Outcome: Progressing   Problem: Nutrition: Goal: Adequate nutrition will be maintained Outcome: Progressing   Problem: Coping: Goal: Level of anxiety will decrease Outcome: Progressing   Problem: Elimination: Goal: Will not experience complications related to bowel motility Outcome: Progressing Goal: Will not experience complications related to urinary retention Outcome: Progressing   Problem: Pain Management: Goal: General experience of comfort will improve Outcome: Progressing   Problem: Safety: Goal: Ability to remain free from injury will improve Outcome: Progressing   Problem: Skin Integrity: Goal: Risk for impaired skin integrity will decrease Outcome: Progressing   Problem: Metabolic: Goal: Ability to maintain appropriate glucose levels will improve Outcome: Progressing   Problem: Nutritional: Goal: Maintenance of adequate nutrition will improve Outcome: Progressing Goal: Progress toward achieving an optimal weight will improve Outcome: Progressing   Problem: Skin Integrity: Goal: Risk for impaired skin integrity will decrease Outcome: Progressing   Problem: Tissue Perfusion: Goal: Adequacy of tissue perfusion will improve Outcome: Progressing   Problem: Activity: Goal: Ability to tolerate increased activity will improve Outcome: Progressing   Problem: Respiratory: Goal: Ability to maintain a clear airway and adequate ventilation will improve Outcome: Progressing   Problem: Role  Relationship: Goal: Method of communication will improve Outcome: Progressing

## 2023-06-18 LAB — T3, FREE: T3, Free: 1.6 pg/mL — ABNORMAL LOW (ref 2.0–4.4)

## 2023-06-18 MED ORDER — MIDAZOLAM HCL 2 MG/2ML IJ SOLN: 2.0000 mg | INTRAMUSCULAR | Status: DC | PRN

## 2023-06-18 MED ORDER — POLYVINYL ALCOHOL 1.4 % OP SOLN: 1.0000 [drp] | Freq: Four times a day (QID) | OPHTHALMIC | Status: DC | PRN

## 2023-06-18 MED ORDER — GLYCOPYRROLATE 1 MG PO TABS: 1.0000 mg | ORAL_TABLET | ORAL | Status: DC | PRN

## 2023-06-18 MED ORDER — GLYCOPYRROLATE 0.2 MG/ML IJ SOLN: 0.2000 mg | INTRAMUSCULAR | Status: DC | PRN

## 2023-06-19 LAB — CULTURE, RESPIRATORY W GRAM STAIN
Culture: NORMAL
Gram Stain: NONE SEEN

## 2023-06-19 LAB — CULTURE, BLOOD (ROUTINE X 2)
Culture: NO GROWTH
Culture: NO GROWTH
Special Requests: ADEQUATE
Special Requests: ADEQUATE

## 2023-07-16 NOTE — Death Summary Note (Signed)
DEATH SUMMARY   Patient Details  Name: Savannah Mcclure MRN: [id number] DOB: Sep 01, 1940  Admission/Discharge Information   Admit Date:  07-07-23  Date of Death: Date of Death: 07/11/2023  Time of Death: Time of Death: 0152 (prior to time change)  Length of Stay: 4  Referring Physician: Dorcas Carrow, DO   Reason(s) for Hospitalization  Shortness of breath  Diagnoses  Preliminary cause of death: Acute hypoxic respiratory failure secondary to acute PE and multifocal pneumonia and COPD, severe sepsis with shock secondary to pneumonia  Secondary Diagnoses (including complications and co-morbidities):  Principal Problem:   Acute hypoxic respiratory failure (HCC) Active Problems:   AKI (acute kidney injury) (HCC)   Community acquired pneumonia   Toxic metabolic encephalopathy  Brief Hospital Course (including significant findings, care, treatment, and services provided and events leading to death)  Savannah Mcclure is a 81 y.o. year old female who presented to the ED on 07-Jul-2023 with chief complaints of bilateral lower extremity and back pain as well as shortness of breath.   Work up in the ED showed possible pneumonia. Patient given 30 cc/kg of fluids and started on broad-spectrum antibiotics for sepsis. Due to significant hypoxia with elevated lactic acidosis, CT angio obtained following premedication for contrast allergy. Post CT angio patient's hypoxia, tachycardia and work of breathing worsened significantly ultimately requiring urgent intubation and mechanical ventilatory support.  CT angio came back positive for PE without right heart strain as well as significant bilateral groundglass opacities.   Patient was admitted to  ICU with acute hypoxic respiratory failure secondary to acute PE and multifocal pneumonia and COPD, severe sepsis with shock, severe PAD, AKI and severe metabolic acidosis.  Patient was started on Heparin gtt and continued on broad spectrum  antibiotics. Patient developed circulatory shock secondary to sepsis from pneumonia with component of vasodilatory effect of propofol. Maintained on low dose nor-epinephrine, with goal MAP > 65 mmHg. Patient remained on the vent with worsening multiorgan failure and toxic metabolic encephalopathy precluding extubation. On 06/15/2023, patient remained on the vent with  increased oxygen requirements. On , oxygen requirements improved, although she remained on the vent unable to wean. On 06/17/23, SBT/SAT was attempted but she again failed. Nephrology was consulted for worsening renal failure which was thought related to IV contrast and severe sepsis. CCRT not recommended at that time. Due to worsening clinical status and poor prognosis, palliative was consulted however patient's daughter was out of state and was planing to arrive late 06/17/2023.  On 2023-07-11, patient became hypotensive and desaturated to the low 60s requiring BVM ventilation with improvement in sats. Patient also requiring max dose of pressors to keep MAP>65. Given significant change in clinical status, patient family was contacted to revisit goals of care. See IPAL note for discussion. Patient was subsequently transition to full comfort care per daughter's request and terminally extubated on 07/11/23 at 0110 and passed away with daughter at bedside at 0152.   Pertinent Labs and Studies  Significant Diagnostic Studies DG Abd 1 View  Result Date: Jul 11, 2023 CLINICAL DATA:  Abdominal distension EXAM: ABDOMEN - 1 VIEW COMPARISON:  None Available. FINDINGS: Gaseous distension of multiple loops of colon, suggesting adynamic colonic ileus. Postsurgical changes involving the lumbar spine. Vascular stents. IMPRESSION: Adynamic colonic ileus. Electronically Signed   By: Charline Bills M.D.   On: 07/11/2023 00:17   US RENAL  Result Date:  CLINICAL DATA:  Acute kidney injury EXAM: RENAL / URINARY TRACT ULTRASOUND COMPLETE  COMPARISON:  None  Available. FINDINGS: Right Kidney: Renal measurements: 9.7 x 4.1 x 4.4 cm = volume: 90.9 mL. Echogenicity within normal limits. No mass or hydronephrosis visualized. Left Kidney: Renal measurements: 8.9 x 4.2 x 4.0 cm = volume: 79.2 mL. Echogenicity within normal limits. No mass or hydronephrosis visualized. Bladder: Bladder is decompressed and contains a Foley catheter. Other: None. IMPRESSION: No hydronephrosis. Electronically Signed   By: Allegra Lai M.D.   On:  18:51   [place] EKG SITE RITE  Result Date:  If Site Rite image not attached, placement could not be confirmed due to current cardiac rhythm.  ECHOCARDIOGRAM COMPLETE  Result Date: 06/15/2023    ECHOCARDIOGRAM REPORT   Patient Name:   Savannah Mcclure Date of Exam: 06/15/2023 Medical Rec #:  [id number]                Height:       63.0 in Accession #:    [id number]               Weight:       115.0 lb Date of Birth:  1940/10/11                BSA:          1.528 m Patient Age:    81 years                 BP:           101/58 mmHg Patient Gender: F                        HR:           111 bpm. Exam Location:  [facility] Procedure: 2D Echo, Cardiac Doppler and Color Doppler Indications:     Pulmonary embolus  History:         Patient has prior history of Echocardiogram examinations, most                  recent 06/25/2018. CAD, PAD, COPD and Stroke, Arrythmias:RBBB                  and Tachycardia, Signs/Symptoms:Chest Pain; Risk                  Factors:Hypertension, Dyslipidemia and Current Smoker.  Sonographer:     Mikki Harbor Referring Phys:  [id number] BRITTON L R[place]STER Diagnosing Phys: Julien Nordmann MD  Sonographer Comments: Echo performed with patient supine and on artificial respirator. IMPRESSIONS  1. Left ventricular ejection fraction, by estimation, is 60 to 65%. Left ventricular ejection fraction by PLAX is 67 %. The left ventricle has normal function. The left ventricle has no regional  wall motion abnormalities. There is moderate left ventricular hypertrophy. Left ventricular diastolic parameters are consistent with Grade I diastolic dysfunction (impaired relaxation).  2. Right ventricular systolic function is normal. The right ventricular size is normal. There is mildly elevated pulmonary artery systolic pressure. The estimated right ventricular systolic pressure is 36.7 mmHg.  3. The mitral valve is normal in structure. No evidence of mitral valve regurgitation. No evidence of mitral stenosis. Moderate mitral annular calcification.  4. The aortic valve is tricuspid. There is mild calcification of the aortic valve. Aortic valve regurgitation is mild. Aortic valve sclerosis/calcification is present, without any evidence of aortic stenosis.  5. The inferior vena cava is normal in size with greater than 50% respiratory variability, suggesting right atrial pressure of 3 mmHg. FINDINGS  Left Ventricle:  Left ventricular ejection fraction, by estimation, is 60 to 65%. Left ventricular ejection fraction by PLAX is 67 %. The left ventricle has normal function. The left ventricle has no regional wall motion abnormalities. The left ventricular internal cavity size was normal in size. There is moderate left ventricular hypertrophy. Left ventricular diastolic parameters are consistent with Grade I diastolic dysfunction (impaired relaxation). Right Ventricle: The right ventricular size is normal. No increase in right ventricular wall thickness. Right ventricular systolic function is normal. There is mildly elevated pulmonary artery systolic pressure. The tricuspid regurgitant velocity is 2.68  m/s, and with an assumed right atrial pressure of 8 mmHg, the estimated right ventricular systolic pressure is 36.7 mmHg. Left Atrium: Left atrial size was normal in size. Right Atrium: Right atrial size was normal in size. Pericardium: There is no evidence of pericardial effusion. Mitral Valve: The mitral valve is  normal in structure. There is mild calcification of the mitral valve leaflet(s). Moderate mitral annular calcification. No evidence of mitral valve regurgitation. No evidence of mitral valve stenosis. MV peak gradient, 7.3 mmHg. The mean mitral valve gradient is 3.0 mmHg. Tricuspid Valve: The tricuspid valve is normal in structure. Tricuspid valve regurgitation is mild . No evidence of tricuspid stenosis. Aortic Valve: The aortic valve is tricuspid. There is mild calcification of the aortic valve. Aortic valve regurgitation is mild. Aortic valve sclerosis/calcification is present, without any evidence of aortic stenosis. Aortic valve mean gradient measures 3.3 mmHg. Aortic valve peak gradient measures 6.1 mmHg. Aortic valve area, by VTI measures 0.94 cm. Pulmonic Valve: The pulmonic valve was normal in structure. Pulmonic valve regurgitation is not visualized. No evidence of pulmonic stenosis. Aorta: The aortic root is normal in size and structure. Venous: The inferior vena cava is normal in size with greater than 50% respiratory variability, suggesting right atrial pressure of 3 mmHg. IAS/Shunts: No atrial level shunt detected by color flow Doppler.  LEFT VENTRICLE PLAX 2D LV EF:         Left            Diastology                ventricular     LV e' lateral:   5.87 cm/s                ejection        LV E/e' lateral: 14.4                fraction by                PLAX is 67                %. LVIDd:         3.30 cm LVIDs:         2.10 cm LV PW:         1.20 cm LV IVS:        1.40 cm LVOT diam:     1.65 cm LV SV:         22 LV SV Index:   15 LVOT Area:     2.14 cm  RIGHT VENTRICLE RV Basal diam:  3.15 cm RV Mid diam:    2.90 cm RV S prime:     18.50 cm/s LEFT ATRIUM             Index        RIGHT ATRIUM           Index  LA diam:        2.70 cm 1.77 cm/m   RA Area:     11.70 cm LA Vol (A2C):   24.4 ml 15.96 ml/m  RA Volume:   27.00 ml  17.67 ml/m LA Vol (A4C):   23.6 ml 15.44 ml/m LA Biplane Vol: 24.6 ml 16.10  ml/m  AORTIC VALVE                    PULMONIC VALVE AV Area (Vmax):    1.01 cm     PV Vmax:       0.69 m/s AV Area (Vmean):   0.93 cm     PV Peak grad:  1.9 mmHg AV Area (VTI):     0.94 cm AV Vmax:           123.67 cm/s AV Vmean:          85.433 cm/s AV VTI:            0.239 m AV Peak Grad:      6.1 mmHg AV Mean Grad:      3.3 mmHg LVOT Vmax:         58.20 cm/s LVOT Vmean:        37.200 cm/s LVOT VTI:          0.105 m LVOT/AV VTI ratio: 0.44  AORTA Ao Root diam: 2.90 cm MITRAL VALVE                TRICUSPID VALVE MV Area (PHT): 5.27 cm     TR Peak grad:   28.7 mmHg MV Area VTI:   1.25 cm     TR Vmax:        268.00 cm/s MV Peak grad:  7.3 mmHg MV Mean grad:  3.0 mmHg     SHUNTS MV Vmax:       1.35 m/s     Systemic VTI:  0.10 m MV Vmean:      75.7 cm/s    Systemic Diam: 1.65 cm MV Decel Time: 144 msec MV E velocity: 84.40 cm/s MV A velocity: 143.00 cm/s MV E/A ratio:  0.59 Julien Nordmann MD Electronically signed by Julien Nordmann MD Signature Date/Time: 06/15/2023/1:51:12 PM    Final    DG Chest Portable 1 View  Result Date: 05/26/2023 CLINICAL DATA:  Status post intubation and OG tube placement EXAM: PORTABLE CHEST 1 VIEW COMPARISON:  Chest x-ray 06/03/2023 FINDINGS: Bilateral multifocal airspace disease has increased. No pneumothorax or pleural effusion identified. There is a new endotracheal tube with distal tip 3 cm above the carina. Enteric tube tip is in the body of the stomach. No acute fractures are seen. IMPRESSION: 1. Increasing bilateral multifocal airspace disease. 2. New endotracheal tube with distal tip 3 cm above the carina. 3. Enteric tube tip is in the body of the stomach. Electronically Signed   By: Darliss Cheney M.D.   On: 05/21/2023 22:05   CT Angio Chest PE W and/or Wo Contrast  Result Date: 05/19/2023 CLINICAL DATA:  High probability for PE. EXAM: CT ANGIOGRAPHY CHEST WITH CONTRAST TECHNIQUE: Multidetector CT imaging of the chest was performed using the standard protocol during  bolus administration of intravenous contrast. Multiplanar CT image reconstructions and MIPs were obtained to evaluate the vascular anatomy. RADIATION DOSE REDUCTION: This exam was performed according to the departmental dose-optimization program which includes automated exposure control, adjustment of the mA and/or kV according to patient size and/or use of iterative reconstruction technique. CONTRAST:  75mL OMNIPAQUE IOHEXOL 350 MG/ML SOLN COMPARISON:  CT chest 05/19/2021. FINDINGS: Cardiovascular: Evaluation of the lung bases is limited secondary to respiratory motion artifact. There are findings suspicious for subsegmental pulmonary embolism in the right lower lobe image 5/87 there is also questionable small pulmonary embolism within a subsegmental branch of the left upper lobe image 5/120. The heart is mildly enlarged. Aorta is normal in size. There is no pericardial effusion. There are atherosclerotic calcifications of the aorta. Mediastinum/Nodes: Mildly enlarged paratracheal lymph nodes are seen measuring up to 1 cm. Mildly enlarged precarinal lymph node measures 12 mm. Enlarged subcarinal lymph node measures 11 mm. There are additional numerous nonenlarged mediastinal lymph nodes. These are new from prior. The visualized esophagus and thyroid gland are within normal limits. Lungs/Pleura: There is new diffuse multifocal ground-glass and airspace disease throughout both lungs, most significant in the right lower lobe. These are seen both centrally and peripherally. Chronic parenchymal opacities with bronchiectasis in the right upper lobe are stable from prior. No pleural effusion or pneumothorax. Upper Abdomen: No acute abnormality. Musculoskeletal: No acute findings.  The bones are osteopenic. Review of the MIP images confirms the above findings. IMPRESSION: 1. Findings suspicious for small subsegmental pulmonary emboli in the right lower lobe and left upper lobe. Limited evaluation of the lung bases  secondary to respiratory motion artifact. No evidence for right heart strain. 2. New diffuse multifocal ground-glass and airspace disease throughout both lungs, most significant in the right lower lobe. Findings are favored as infectious/inflammatory. 3. Stable chronic parenchymal opacities with bronchiectasis in the right upper lobe. 4. New mild mediastinal lymphadenopathy, likely reactive. 5. Aortic atherosclerosis. Aortic Atherosclerosis (ICD10-I70.0). Electronically Signed   By: Darliss Cheney M.D.   On: 05/29/2023 21:29   DG Chest Port 1 View  Result Date: 05/16/2023 CLINICAL DATA:  Shortness of breath. EXAM: PORTABLE CHEST 1 VIEW COMPARISON:  July 22, 2021. FINDINGS: The heart size and mediastinal contours are within normal limits. Interval development of nodular densities throughout both lungs concerning for possible metastatic disease or inflammation. Scarring is noted in right midlung. The visualized skeletal structures are unremarkable. IMPRESSION: Interval development of nodular densities throughout both lungs concerning for possible metastatic disease or inflammation. CT scan of the chest with intravenous contrast is recommended for further evaluation. Electronically Signed   By: Lupita Raider M.D.   On: 05/23/2023 16:08    Microbiology Recent Results (from the past 240 hour(s))  Culture, blood (Routine x 2)     Status: None (Preliminary result)   Collection Time: 05/24/2023  1:53 PM   Specimen: BLOOD  Result Value Ref Range Status   Specimen Description BLOOD LEFT ANTECUBITAL  Final   Special Requests   Final    BOTTLES DRAWN AEROBIC AND ANAEROBIC Blood Culture adequate volume   Culture   Final    NO GROWTH 3 DAYS Performed at [facility], [address] [place], [name] [address]    Report Status PENDING  Incomplete  Resp panel by RT-PCR (RSV, Flu A&B, Covid) Anterior Nasal Swab     Status: None   Collection Time: 06/11/2023  3:47 PM   Specimen: Anterior Nasal Swab   Result Value Ref Range Status   SARS Coronavirus 2 by RT PCR NEGATIVE NEGATIVE Final    Comment: (NOTE) SARS-CoV-2 target nucleic acids are NOT DETECTED.  The SARS-CoV-2 RNA is generally detectable in upper respiratory specimens during the acute phase of infection. The lowest concentration of SARS-CoV-2 viral copies this assay can  detect is 138 copies/mL. A negative result does not preclude SARS-Cov-2 infection and should not be used as the sole basis for treatment or other patient management decisions. A negative result may occur with  improper specimen collection/handling, submission of specimen other than nasopharyngeal swab, presence of viral mutation(s) within the areas targeted by this assay, and inadequate number of viral copies(<138 copies/mL). A negative result must be combined with clinical observations, patient history, and epidemiological information. The expected result is Negative.  Fact Sheet for Patients:  BloggerCourse.com  Fact Sheet for Healthcare Providers:  SeriousBroker.it  This test is no t yet approved or cleared by the [place] FDA and  has been authorized for detection and/or diagnosis of SARS-CoV-2 by FDA under an Emergency Use Authorization (EUA). This EUA will remain  in effect (meaning this test can be used) for the duration of the COVID-19 declaration under Section 564(b)(1) of the Act, 21 U.S.C.section 360bbb-3(b)(1), unless the authorization is terminated  or revoked sooner.       Influenza A by PCR NEGATIVE NEGATIVE Final   Influenza B by PCR NEGATIVE NEGATIVE Final    Comment: (NOTE) The Xpert Xpress SARS-CoV-2/FLU/RSV plus assay is intended as an aid in the diagnosis of influenza from Nasopharyngeal swab specimens and should not be used as a sole basis for treatment. Nasal washings and aspirates are unacceptable for Xpert Xpress SARS-CoV-2/FLU/RSV testing.  Fact Sheet for  Patients: BloggerCourse.com  Fact Sheet for Healthcare Providers: SeriousBroker.it  This test is not yet approved or cleared by the [place] FDA and has been authorized for detection and/or diagnosis of SARS-CoV-2 by FDA under an Emergency Use Authorization (EUA). This EUA will remain in effect (meaning this test can be used) for the duration of the COVID-19 declaration under Section 564(b)(1) of the Act, 21 U.S.C. section 360bbb-3(b)(1), unless the authorization is terminated or revoked.     Resp Syncytial Virus by PCR NEGATIVE NEGATIVE Final    Comment: (NOTE) Fact Sheet for Patients: BloggerCourse.com  Fact Sheet for Healthcare Providers: SeriousBroker.it  This test is not yet approved or cleared by the [place] FDA and has been authorized for detection and/or diagnosis of SARS-CoV-2 by FDA under an Emergency Use Authorization (EUA). This EUA will remain in effect (meaning this test can be used) for the duration of the COVID-19 declaration under Section 564(b)(1) of the Act, 21 U.S.C. section 360bbb-3(b)(1), unless the authorization is terminated or revoked.  Performed at [facility], [address] [place], [name] [address]   Culture, blood (Routine x 2)     Status: None (Preliminary result)   Collection Time: 05/23/2023  8:30 PM   Specimen: BLOOD  Result Value Ref Range Status   Specimen Description BLOOD BLOOD RIGHT ARM  Final   Special Requests   Final    BOTTLES DRAWN AEROBIC AND ANAEROBIC Blood Culture adequate volume   Culture   Final    NO GROWTH 3 DAYS Performed at [facility], [address], [place], [name] [address]    Report Status PENDING  Incomplete  MRSA Next Gen by PCR, Nasal     Status: None   Collection Time: 06/15/23  6:50 AM   Specimen: Nasal Mucosa; Nasal Swab  Result Value Ref Range Status   MRSA by PCR Next Gen  NOT DETECTED NOT DETECTED Final    Comment: (NOTE) The GeneXpert MRSA Assay (FDA approved for NASAL specimens only), is one component of a comprehensive MRSA colonization surveillance program. It is not  intended to diagnose MRSA infection nor to guide or monitor treatment for MRSA infections. Test performance is not FDA approved in patients less than 4 years old. Performed at [facility], [address] [place], [name] [address]   Culture, Respiratory w Gram Stain     Status: None (Preliminary result)   Collection Time: 06/15/23 10:20 PM   Specimen: Tracheal Aspirate; Respiratory  Result Value Ref Range Status   Specimen Description   Final    TRACHEAL ASPIRATE Performed at [facility], [address] [place], [name] [address]    Special Requests   Final    NONE Performed at [facility], [address] [place], [name] [address]    Gram Stain NO WBC SEEN NO ORGANISMS SEEN   Final   Culture   Final    CULTURE REINCUBATED FOR BETTER GROWTH Performed at [facility], [address] [place], [name] [address]    Report Status PENDING  Incomplete    Lab Basic Metabolic Panel: Recent Labs  Lab 06/12/23 1210 05/30/2023 1353 05/22/2023 2158 06/15/23 0516 06/15/23 1444  0345 06/17/23 0400  NA 136 137  --  138  --  134* 128*  K 3.4* 3.6  --  3.2* 3.0* 4.6 4.5  CL 99 99  --  104  --  100 96*  CO2 23 20*  --  22  --  20* 18*  GLUCOSE 167* 181*  --  225*  --  167* 200*  BUN 18 36*  --  37*  --  46* 63*  CREATININE 1.08* 1.16*  --  1.12*  --  1.70* 2.66*  CALCIUM 9.0 8.8*  --  7.9*  --  7.8* 7.4*  MG  --   --  1.4* 2.4  --  2.2 2.0  PHOS  --   --  3.5 4.2  --  4.0 4.9*   Liver Function Tests: Recent Labs  Lab 05/30/2023 1353 06/15/23 0516  AST 57* 38  ALT 25 17  ALKPHOS 83 64  BILITOT 1.2 1.2  PROT 8.8* 6.2*  ALBUMIN 3.9 2.7*   No results for input(s): "LIPASE", "AMYLASE" in the last 168 hours. No results for  input(s): "AMMONIA" in the last 168 hours. CBC: Recent Labs  Lab 06/13/2023 1353 05/25/2023 1547 06/15/23 0516 06/15/23 1753  0020 06/17/23 0400  WBC 19.0* 14.2* 31.4*  --  33.9* 42.2*  NEUTROABS 16.6* 12.2*  --   --   --   --   HGB 13.9 12.1 9.9* 10.9* 10.2* 9.2*  HCT 41.3 36.5 30.1* 33.0* 30.2* 27.7*  MCV 89.8 90.6 91.8  --  92.6 93.3  PLT 438* 347 425*  --  392 360   Cardiac Enzymes: No results for input(s): "CKTOTAL", "CKMB", "CKMBINDEX", "TROPONINI" in the last 168 hours. Sepsis Labs: Recent Labs  Lab 06/05/2023 1547 05/27/2023 1759 06/09/2023 2158 06/15/23 0030 06/15/23 0516  0020  0345  0845  1055 06/17/23 0400  PROCALCITON  --   --  3.76  --  6.49  --  6.67  --   --   --   WBC 14.2*  --   --   --  31.4* 33.9*  --   --   --  42.2*  LATICACIDVEN 2.2*   < > 2.9* 1.6  --   --   --  1.4 1.4  --    < > = values in this interval not displayed.    Procedures/Operations  Intubation  PICC Line   Wallie Renshaw, AGACNP Critical Care Nurse Practitioner 07/04/2023, 4:04 AM

## 2023-07-16 NOTE — Progress Notes (Signed)
1930 - Pt resting comfortably on vent with IV medications running per EMAR. Pt skin mottled and bilateral feet are dusky/cyanotic with no pulses palpable or dopplerable on right foot. Pt opens eyes to voice, but does not follow commands. Pt with distended abdomen, though soft - per previous shift, pt has been distended throughout the day.   2000 - Provider notified of inability to doppler pulse to right foot. No new orders received.   2215 - Patient with new hypotension despite Levo @ 10. Savannah Ivan, NP notified via secure chat and requested that Levo order be changed to allow for additional titration.  2245 - Provider at bedside. Pt noted to have episode of desaturation to 63% despite being on ventilator. Pt required assist w/ ambu bag to bring sats up. Provider increased 02 to 100% on vent when pt placed back on vent. Provider arrived at bedside. Attempted a-line, but due to BP and calcifications, unable to obtain. Tube feeds held.   2330 - Pt's daughter notified via phone by this nurse and [name], NP. Levo titrated per E. Ouma, NP verbal orders due to pt's significant hypotension. Provider at bedside, attempting procedure.   2350 - Pt's daughter at bedside and met with Savannah Ivan, NP. Pt transitioned to DNR. Pt's daughter Savannah Mcclure, requesting additional time as able to try to get into contact with her sister, Savannah Mcclure  89 - Removed a silver metal colored ring from pt's right ring finger due to increased swelling to pt's hands. Handed ring and a black cell phone to [place], pt's daughter. No other belongings noted in pt's room. Midnight assessment/tasks not completed at pt's daughters request.  0110 - Pt extubated to comfort care per pt's daughter, Savannah Mcclure. Pt's daughter and hospital staff unable to contact [place], daughter, despite multiple attempts.   8841 - Spoke w/ Savannah Mcclure. At [facility] notified.   6606 (prior to time change)- Pt passed with daughter at bedside. Pronounced by this  nurse and Savannah Mcclure., RN. Provider notified.   0106 (after time change) - Attempted to leave message w/ Savannah Mcclure regarding TOD.   0122 (after time change) - Spoke with Savannah S. At [facility] and updated with TOD.   0130 (after time change) - Pt's daughter does not have a funeral home at this time. Number to house supervisor given to pt's daughter and advised to call back with funeral home.   0200 - Post mortem care completed. x3 silver metal colored rings found in patient's room. Rings placed on pt's right pinky finger to be taken to morgue.

## 2023-07-16 NOTE — Progress Notes (Addendum)
Contacted patient's daughters  Savannah Mcclure [phone]) and daughter, Savannah Mcclure ([phone]) to discuss goals of care following change in patient's clinical status. I was able to reach only one daughter Savannah Mcclure who is the POA. Patient's daughter Savannah Mcclure came to the bedside and informed us that she has also attempted several times to reach her sister with no answer. I attempted multiple times thereafter with no answer. Unable to leave a voice message.  [name], DNP, CCRN, FNP-C, AGACNP-BC Acute Care & Family Nurse Practitioner  [facility]  See Amion for personal pager PCCM on call pager [phone] until 7 am

## 2023-07-16 NOTE — IPAL (Signed)
Interdisciplinary Goals of Care Family Meeting    Patient Name: Savannah Mcclure   MRN: [id number]   Date of Birth/ Sex: [birth year] , female      Admission Date: 06/12/2023  Attending Provider: Raechel Chute, MD  Primary Diagnosis: Acute hypoxic respiratory failure [facility])     Date carried out: 06/17/23 @2330  Location of the meeting: Bedside   Member's involved: NP, Primary RN and Family Member or next of kin Vennie Homans Power of Attorney or Environmental health practitioner:    Discussion:  Advance Care Planning/Goals of Care discussion was performed during the course of treatment to decide on type of care right for this patient following admission to the ICU.   I met with patient's daughter and POA to discuss goals of care in details following  change in patient's current status. Reviewed patient's worsening lab, ABGs, vital signs including unstable HR and blood pressure requiring multiple pressors  and overall poor prognosis with the family at the bedside and answered all their question.   Discussed prognosis, expected outcome with or without ongoing aggressive treatments and the options for de-escalation of care.   Diagnosis(es): Acute hypoxic hypercapnic respiratory failure secondary pneumonia, Subsegmental PE, COPD, severe sepsis with shock and multiorgan failure Prognosis: POOR Code Status: DNR Disposition: ICU Next Steps:  Family understands the situation. They have consented and agreed to DNR/DNI and would not wish to pursue any aggressive treatment.  Patient's family would like to proceed with full comfort care including terminal extubation.    Family are satisfied with Plan of action and management. All questions answered     Total Time Spent Face to Face addressing advance care planning in the presence of the Patient: 35 minutes       [name], DNP, FNP-C, AGACNP-BC Acute Care Nurse Practitioner [facility] Pager:  [phone] [place] [facility]

## 2023-07-16 NOTE — Progress Notes (Signed)
Paged for family support-1 daughter at bedside. She shared her heartbreak around the circumstances-shared how she has not seen her mom or sister in a few years and is grateful to be here for her mom now-offered to go in room with daughter-daughter declined and asked to have some time alone.chaplain supported daughter and offered compassionate presence. Staff aware how to reach chaplain office if needs change

## 2023-07-16 DEATH — deceased

## 2023-07-20 ENCOUNTER — Other Ambulatory Visit: Payer: Medicare Other

## 2023-10-17 ENCOUNTER — Encounter: Payer: Medicare Other | Admitting: Student in an Organized Health Care Education/Training Program

## 2023-12-07 ENCOUNTER — Ambulatory Visit: Payer: Medicare Other | Admitting: Family Medicine
